# Patient Record
Sex: Female | Born: 1940 | Race: White | Hispanic: No | Smoking: Never smoker
Health system: Southern US, Community
[De-identification: ages and names within clinical notes are randomized; demographics above are authoritative.]

## PROBLEM LIST (undated history)

## (undated) DIAGNOSIS — F329 Major depressive disorder, single episode, unspecified: Secondary | ICD-10-CM

## (undated) DIAGNOSIS — N289 Disorder of kidney and ureter, unspecified: Secondary | ICD-10-CM

## (undated) DIAGNOSIS — E1129 Type 2 diabetes mellitus with other diabetic kidney complication: Secondary | ICD-10-CM

## (undated) DIAGNOSIS — G4730 Sleep apnea, unspecified: Secondary | ICD-10-CM

## (undated) DIAGNOSIS — I10 Essential (primary) hypertension: Secondary | ICD-10-CM

## (undated) DIAGNOSIS — N200 Calculus of kidney: Secondary | ICD-10-CM

## (undated) DIAGNOSIS — F32A Depression, unspecified: Secondary | ICD-10-CM

## (undated) DIAGNOSIS — E785 Hyperlipidemia, unspecified: Secondary | ICD-10-CM

## (undated) DIAGNOSIS — K635 Polyp of colon: Secondary | ICD-10-CM

## (undated) DIAGNOSIS — C4492 Squamous cell carcinoma of skin, unspecified: Secondary | ICD-10-CM

## (undated) HISTORY — DX: Hyperlipidemia, unspecified: E78.5

## (undated) HISTORY — PX: ESOPHAGOGASTRODUODENOSCOPY: SHX1529

## (undated) HISTORY — DX: Depression, unspecified: F32.A

## (undated) HISTORY — DX: Type 2 diabetes mellitus with other diabetic kidney complication: E11.29

## (undated) HISTORY — PX: APPENDECTOMY: SHX54

## (undated) HISTORY — PX: COLONOSCOPY: SHX174

## (undated) HISTORY — DX: Polyp of colon: K63.5

## (undated) HISTORY — DX: Major depressive disorder, single episode, unspecified: F32.9

## (undated) HISTORY — PX: CHOLECYSTECTOMY: SHX55

---

## 1998-11-05 ENCOUNTER — Ambulatory Visit (HOSPITAL_COMMUNITY): Admission: RE | Admit: 1998-11-05 | Discharge: 1998-11-05 | Payer: Self-pay | Admitting: *Deleted

## 1998-11-11 ENCOUNTER — Ambulatory Visit (HOSPITAL_COMMUNITY): Admission: RE | Admit: 1998-11-11 | Discharge: 1998-11-11 | Payer: Self-pay | Admitting: General Surgery

## 1999-03-17 ENCOUNTER — Encounter: Admission: RE | Admit: 1999-03-17 | Discharge: 1999-03-17 | Payer: Self-pay | Admitting: Neurosurgery

## 1999-03-17 ENCOUNTER — Encounter: Payer: Self-pay | Admitting: Neurosurgery

## 1999-07-28 ENCOUNTER — Other Ambulatory Visit: Admission: RE | Admit: 1999-07-28 | Discharge: 1999-07-28 | Payer: Self-pay | Admitting: Family Medicine

## 1999-10-06 ENCOUNTER — Encounter: Admission: RE | Admit: 1999-10-06 | Discharge: 1999-10-06 | Payer: Self-pay | Admitting: Family Medicine

## 1999-10-06 ENCOUNTER — Encounter: Payer: Self-pay | Admitting: Family Medicine

## 1999-11-25 ENCOUNTER — Encounter: Payer: Self-pay | Admitting: Family Medicine

## 1999-11-25 ENCOUNTER — Encounter: Admission: RE | Admit: 1999-11-25 | Discharge: 1999-11-25 | Payer: Self-pay | Admitting: Family Medicine

## 1999-12-01 ENCOUNTER — Encounter: Payer: Self-pay | Admitting: Gynecology

## 1999-12-01 ENCOUNTER — Encounter: Admission: RE | Admit: 1999-12-01 | Discharge: 1999-12-01 | Payer: Self-pay | Admitting: Gynecology

## 2000-12-06 ENCOUNTER — Encounter: Admission: RE | Admit: 2000-12-06 | Discharge: 2000-12-06 | Payer: Self-pay | Admitting: Family Medicine

## 2000-12-06 ENCOUNTER — Encounter: Payer: Self-pay | Admitting: Family Medicine

## 2001-08-20 ENCOUNTER — Other Ambulatory Visit: Admission: RE | Admit: 2001-08-20 | Discharge: 2001-08-20 | Payer: Self-pay | Admitting: Family Medicine

## 2002-08-26 ENCOUNTER — Other Ambulatory Visit: Admission: RE | Admit: 2002-08-26 | Discharge: 2002-08-26 | Payer: Self-pay | Admitting: Family Medicine

## 2003-11-27 ENCOUNTER — Encounter: Admission: RE | Admit: 2003-11-27 | Discharge: 2003-11-27 | Payer: Self-pay | Admitting: Family Medicine

## 2003-12-12 ENCOUNTER — Other Ambulatory Visit: Admission: RE | Admit: 2003-12-12 | Discharge: 2003-12-12 | Payer: Self-pay | Admitting: Family Medicine

## 2004-01-15 ENCOUNTER — Ambulatory Visit (HOSPITAL_COMMUNITY): Admission: RE | Admit: 2004-01-15 | Discharge: 2004-01-15 | Payer: Self-pay | Admitting: *Deleted

## 2004-02-11 ENCOUNTER — Observation Stay (HOSPITAL_COMMUNITY): Admission: RE | Admit: 2004-02-11 | Discharge: 2004-02-12 | Payer: Self-pay | Admitting: General Surgery

## 2004-12-22 ENCOUNTER — Other Ambulatory Visit: Admission: RE | Admit: 2004-12-22 | Discharge: 2004-12-22 | Payer: Self-pay | Admitting: Family Medicine

## 2006-06-16 ENCOUNTER — Ambulatory Visit: Payer: Self-pay | Admitting: Family Medicine

## 2007-02-08 ENCOUNTER — Ambulatory Visit: Payer: Self-pay | Admitting: Gastroenterology

## 2009-11-12 ENCOUNTER — Ambulatory Visit: Payer: Self-pay | Admitting: Unknown Physician Specialty

## 2009-12-23 ENCOUNTER — Emergency Department: Payer: Self-pay | Admitting: Emergency Medicine

## 2010-05-09 HISTORY — PX: RECTAL PROLAPSE REPAIR: SHX759

## 2013-01-18 ENCOUNTER — Emergency Department: Payer: Self-pay | Admitting: Emergency Medicine

## 2013-01-18 LAB — CBC
HCT: 38.6 % (ref 35.0–47.0)
HGB: 13.1 g/dL (ref 12.0–16.0)
MCH: 31.2 pg (ref 26.0–34.0)
MCHC: 33.8 g/dL (ref 32.0–36.0)
MCV: 92 fL (ref 80–100)
Platelet: 170 10*3/uL (ref 150–440)
RBC: 4.18 10*6/uL (ref 3.80–5.20)
RDW: 14.1 % (ref 11.5–14.5)
WBC: 5.7 10*3/uL (ref 3.6–11.0)

## 2013-01-18 LAB — COMPREHENSIVE METABOLIC PANEL
Albumin: 3.3 g/dL — ABNORMAL LOW (ref 3.4–5.0)
Alkaline Phosphatase: 120 U/L (ref 50–136)
Anion Gap: 8 (ref 7–16)
BUN: 14 mg/dL (ref 7–18)
Bilirubin,Total: 0.6 mg/dL (ref 0.2–1.0)
Calcium, Total: 9.3 mg/dL (ref 8.5–10.1)
Chloride: 103 mmol/L (ref 98–107)
Co2: 26 mmol/L (ref 21–32)
Creatinine: 1.48 mg/dL — ABNORMAL HIGH (ref 0.60–1.30)
EGFR (African American): 41 — ABNORMAL LOW
EGFR (Non-African Amer.): 35 — ABNORMAL LOW
Glucose: 342 mg/dL — ABNORMAL HIGH (ref 65–99)
Osmolality: 288 (ref 275–301)
Potassium: 3.9 mmol/L (ref 3.5–5.1)
SGOT(AST): 22 U/L (ref 15–37)
SGPT (ALT): 46 U/L (ref 12–78)
Sodium: 137 mmol/L (ref 136–145)
Total Protein: 6.2 g/dL — ABNORMAL LOW (ref 6.4–8.2)

## 2013-01-18 LAB — URINALYSIS, COMPLETE
Bacteria: NONE SEEN
Bilirubin,UR: NEGATIVE
Glucose,UR: >=500 mg/dL (ref 0–75)
Hyaline Cast: 47
Nitrite: NEGATIVE
Ph: 5 (ref 4.5–8.0)
Protein: 30
RBC,UR: 70 /HPF (ref 0–5)
Specific Gravity: 1.025 (ref 1.003–1.030)
Squamous Epithelial: 15
WBC UR: 11 /HPF (ref 0–5)

## 2013-01-18 LAB — TROPONIN I: Troponin-I: <0.02 ng/mL

## 2013-05-10 ENCOUNTER — Ambulatory Visit: Payer: Self-pay | Admitting: Internal Medicine

## 2015-05-13 DIAGNOSIS — T2101XA Burn of unspecified degree of chest wall, initial encounter: Secondary | ICD-10-CM | POA: Diagnosis not present

## 2015-05-13 DIAGNOSIS — E1165 Type 2 diabetes mellitus with hyperglycemia: Secondary | ICD-10-CM | POA: Diagnosis not present

## 2015-05-13 DIAGNOSIS — M545 Low back pain: Secondary | ICD-10-CM | POA: Diagnosis not present

## 2015-05-13 DIAGNOSIS — M5136 Other intervertebral disc degeneration, lumbar region: Secondary | ICD-10-CM | POA: Diagnosis not present

## 2015-05-13 DIAGNOSIS — M5032 Other cervical disc degeneration, mid-cervical region, unspecified level: Secondary | ICD-10-CM | POA: Diagnosis not present

## 2015-05-13 DIAGNOSIS — E119 Type 2 diabetes mellitus without complications: Secondary | ICD-10-CM | POA: Diagnosis not present

## 2015-05-13 DIAGNOSIS — M7062 Trochanteric bursitis, left hip: Secondary | ICD-10-CM | POA: Diagnosis not present

## 2015-05-13 DIAGNOSIS — E785 Hyperlipidemia, unspecified: Secondary | ICD-10-CM | POA: Diagnosis not present

## 2015-05-13 DIAGNOSIS — M542 Cervicalgia: Secondary | ICD-10-CM | POA: Diagnosis not present

## 2015-05-13 DIAGNOSIS — I10 Essential (primary) hypertension: Secondary | ICD-10-CM | POA: Diagnosis not present

## 2015-05-20 DIAGNOSIS — I10 Essential (primary) hypertension: Secondary | ICD-10-CM | POA: Diagnosis not present

## 2015-05-20 DIAGNOSIS — E785 Hyperlipidemia, unspecified: Secondary | ICD-10-CM | POA: Diagnosis not present

## 2015-05-20 DIAGNOSIS — E119 Type 2 diabetes mellitus without complications: Secondary | ICD-10-CM | POA: Diagnosis not present

## 2015-05-20 DIAGNOSIS — Z23 Encounter for immunization: Secondary | ICD-10-CM | POA: Diagnosis not present

## 2015-05-26 DIAGNOSIS — M899 Disorder of bone, unspecified: Secondary | ICD-10-CM | POA: Diagnosis not present

## 2015-06-16 DIAGNOSIS — S80812A Abrasion, left lower leg, initial encounter: Secondary | ICD-10-CM | POA: Diagnosis not present

## 2015-06-16 DIAGNOSIS — S0033XA Contusion of nose, initial encounter: Secondary | ICD-10-CM | POA: Diagnosis not present

## 2015-06-21 ENCOUNTER — Encounter: Payer: Self-pay | Admitting: Emergency Medicine

## 2015-06-21 ENCOUNTER — Emergency Department: Payer: Medicare Other

## 2015-06-21 ENCOUNTER — Emergency Department
Admission: EM | Admit: 2015-06-21 | Discharge: 2015-06-21 | Disposition: A | Discharge status: Home / Self Care | Payer: Medicare Other | Attending: Emergency Medicine | Admitting: Emergency Medicine

## 2015-06-21 DIAGNOSIS — Z880 Allergy status to penicillin: Secondary | ICD-10-CM | POA: Diagnosis not present

## 2015-06-21 DIAGNOSIS — Y92007 Garden or yard of unspecified non-institutional (private) residence as the place of occurrence of the external cause: Secondary | ICD-10-CM | POA: Diagnosis not present

## 2015-06-21 DIAGNOSIS — Y998 Other external cause status: Secondary | ICD-10-CM | POA: Insufficient documentation

## 2015-06-21 DIAGNOSIS — S93402A Sprain of unspecified ligament of left ankle, initial encounter: Secondary | ICD-10-CM

## 2015-06-21 DIAGNOSIS — Y9301 Activity, walking, marching and hiking: Secondary | ICD-10-CM | POA: Insufficient documentation

## 2015-06-21 DIAGNOSIS — M7989 Other specified soft tissue disorders: Secondary | ICD-10-CM | POA: Diagnosis not present

## 2015-06-21 DIAGNOSIS — S99912A Unspecified injury of left ankle, initial encounter: Secondary | ICD-10-CM | POA: Diagnosis present

## 2015-06-21 DIAGNOSIS — W1842XA Slipping, tripping and stumbling without falling due to stepping into hole or opening, initial encounter: Secondary | ICD-10-CM | POA: Diagnosis not present

## 2015-06-21 DIAGNOSIS — S9002XA Contusion of left ankle, initial encounter: Secondary | ICD-10-CM | POA: Insufficient documentation

## 2015-06-21 NOTE — ED Provider Notes (Signed)
CSN: [id number]     Arrival date & time 06/21/15  2015 History   First MD Initiated Contact with Patient 06/21/15 2149     Chief Complaint  Patient presents with  . Ankle Pain    left ankle injury     (Consider location/radiation/quality/duration/timing/severity/associated sxs/prior Treatment) HPI  75 year old female presents to the emergency department for evaluation of left ankle injury. Approximate 5:30 PM tonight, she was walking her dog, stepped into a hole and rolled her left ankle. Patient's pain is 0 out of 10 with lying down but with weightbearing pain is moderate to severe. She has difficulty with ambulation due to pain. Pain is over the lateral aspect of her ankle. She denies any other injuries to her body. No knee, hip, head, back or neck pain. She is not having medications for pain.    History reviewed. No pertinent past medical history. History reviewed. No pertinent past surgical history. History reviewed. No pertinent family history. Social History  Substance Use Topics  . Smoking status: Never Smoker   . Smokeless tobacco: None  . Alcohol Use: No   OB History    No data available     Review of Systems  Constitutional: Negative.   Cardiovascular: Negative for chest pain and leg swelling.  Gastrointestinal: Negative for abdominal pain.  Musculoskeletal: Positive for joint swelling and gait problem. Negative for back pain and neck pain.  Skin: Negative for color change, rash and wound.  Neurological: Negative for dizziness, syncope and weakness.  Psychiatric/Behavioral: Negative for hallucinations and confusion.  All other systems reviewed and are negative.     Allergies  Amoxicillin and Morphine and related  Home Medications   Prior to Admission medications   Not on File   BP 129/81 mmHg  Pulse 70  Temp(Src) 98.2 F (36.8 C)  Resp 16  Ht 5\' 4"  (1.626 m)  Wt 63.504 kg  BMI 24.02 kg/m2  SpO2 98% Physical Exam  Constitutional: She is oriented to  person, place, and time. She appears well-developed and well-nourished. No distress.  HENT:  Head: Normocephalic and atraumatic.  Eyes: EOM are normal. Pupils are equal, round, and reactive to light.  Neck: Normal range of motion. Neck supple.  Cardiovascular: Normal rate and regular rhythm.   Pulmonary/Chest: No respiratory distress.  Musculoskeletal:       Left ankle: She exhibits decreased range of motion, swelling and ecchymosis. She exhibits no deformity, no laceration and normal pulse. Tenderness. Lateral malleolus and AITFL tenderness found. Achilles tendon exhibits no pain, no defect and normal Thompson's test results.  Neurological: She is alert and oriented to person, place, and time.  Skin: Skin is warm and dry.  Psychiatric: She has a normal mood and affect. Her behavior is normal. Judgment and thought content normal.    ED Course  Procedures (including critical care time) SPLINT APPLICATION Date/Time: [id number] PM Authorized by: Feliberto Gottron Consent: Verbal consent obtained. Risks and benefits: risks, benefits and alternatives were discussed Consent given by: patient Splint applied by: ED tech Location details: Left ankle  Splint type: Ankle stirrup  Supplies used: Prefabricated ankle stirrup splint, Ace wrap, prewrap.  Post-procedure: The splinted body part was neurovascularly unchanged following the procedure. Patient tolerance: Patient tolerated the procedure well with no immediate complications.    Labs Review Labs Reviewed - No data to display  Imaging Review Dg Ankle Complete Left  06/21/2015  CLINICAL DATA:  Patient stepped into a hole and twisted the ankle. Anterior and lateral ankle  pain. EXAM: LEFT ANKLE COMPLETE - 3+ VIEW COMPARISON:  None. FINDINGS: Soft tissue swelling about the lateral aspect of the left ankle. No evidence of acute fracture or dislocation. No focal bone lesion or bone destruction. No radiopaque soft tissue foreign bodies.  IMPRESSION: Soft tissue swelling lateral to the left ankle. No acute bony abnormalities. Electronically Signed   By: Lucienne Capers M.D.   On: 06/21/2015 22:53   I have personally reviewed and evaluated these images and lab results as part of my medical decision-making.   EKG Interpretation None      MDM   Final diagnoses:  Ankle sprain, left, initial encounter    75 year old female with left lateral ankle sprain. She is placed into a splint, given a walker. She'll rest ice and elevate. Follow-up with orthopedics if no improvement in 5-7 days.    Duanne Guess, PA-C 06/21/15 2305  Orbie Pyo, MD 06/21/15 [phone]

## 2015-06-21 NOTE — ED Notes (Signed)
Was chasing her dog and twisted her ankle when she stepped in a hole. Ice on injury

## 2015-06-21 NOTE — ED Notes (Signed)
Patient states that she was walking in the yard and stepped into a hole and turned her left foot over.

## 2015-06-21 NOTE — Discharge Instructions (Signed)
Ankle Sprain °An ankle sprain is an injury to the strong, fibrous tissues (ligaments) that hold the bones of your ankle joint together.  °CAUSES °An ankle sprain is usually caused by a fall or by twisting your ankle. Ankle sprains most commonly occur when you step on the outer edge of your foot, and your ankle turns inward. People who participate in sports are more prone to these types of injuries.  °SYMPTOMS  °· Pain in your ankle. The pain may be present at rest or only when you are trying to stand or walk. °· Swelling. °· Bruising. Bruising may develop immediately or within 1 to 2 days after your injury. °· Difficulty standing or walking, particularly when turning corners or changing directions. °DIAGNOSIS  °Your caregiver will ask you details about your injury and perform a physical exam of your ankle to determine if you have an ankle sprain. During the physical exam, your caregiver will press on and apply pressure to specific areas of your foot and ankle. Your caregiver will try to move your ankle in certain ways. An X-ray exam may be done to be sure a bone was not broken or a ligament did not separate from one of the bones in your ankle (avulsion fracture).  °TREATMENT  °Certain types of braces can help stabilize your ankle. Your caregiver can make a recommendation for this. Your caregiver may recommend the use of medicine for pain. If your sprain is severe, your caregiver may refer you to a surgeon who helps to restore function to parts of your skeletal system (orthopedist) or a physical therapist. °HOME CARE INSTRUCTIONS  °· Apply ice to your injury for 1-2 days or as directed by your caregiver. Applying ice helps to reduce inflammation and pain. °· Put ice in a plastic bag. °· Place a towel between your skin and the bag. °· Leave the ice on for 15-20 minutes at a time, every 2 hours while you are awake. °· Only take over-the-counter or prescription medicines for pain, discomfort, or fever as directed by  your caregiver. °· Elevate your injured ankle above the level of your heart as much as possible for 2-3 days. °· If your caregiver recommends crutches, use them as instructed. Gradually put weight on the affected ankle. Continue to use crutches or a cane until you can walk without feeling pain in your ankle. °· If you have a plaster splint, wear the splint as directed by your caregiver. Do not rest it on anything harder than a pillow for the first 24 hours. Do not put weight on it. Do not get it wet. You may take it off to take a shower or bath. °· You may have been given an elastic bandage to wear around your ankle to provide support. If the elastic bandage is too tight (you have numbness or tingling in your foot or your foot becomes cold and blue), adjust the bandage to make it comfortable. °· If you have an air splint, you may blow more air into it or let air out to make it more comfortable. You may take your splint off at night and before taking a shower or bath. Wiggle your toes in the splint several times per day to decrease swelling. °SEEK MEDICAL CARE IF:  °· You have rapidly increasing bruising or swelling. °· Your toes feel extremely cold or you lose feeling in your foot. °· Your pain is not relieved with medicine. °SEEK IMMEDIATE MEDICAL CARE IF: °· Your toes are numb or blue. °·   You have severe pain that is increasing. °MAKE SURE YOU:  °· Understand these instructions. °· Will watch your condition. °· Will get help right away if you are not doing well or get worse. °  °This information is not intended to replace advice given to you by your health care provider. Make sure you discuss any questions you have with your health care provider. °  °Document Released: 04/25/2005 Document Revised: 05/16/2014 Document Reviewed: 05/07/2011 °Elsevier Interactive Patient Education ©2016 [employer] ° °Cryotherapy °Cryotherapy means treatment with cold. Ice or gel packs can be used to reduce both pain and swelling.  Ice is the most helpful within the first 24 to 48 hours after an injury or flare-up from overusing a muscle or joint. Sprains, strains, spasms, burning pain, shooting pain, and aches can all be eased with ice. Ice can also be used when recovering from surgery. Ice is effective, has very few side effects, and is safe for most people to use. °PRECAUTIONS  °Ice is not a safe treatment option for people with: °· Raynaud phenomenon. This is a condition affecting small blood vessels in the extremities. Exposure to cold may cause your problems to return. °· Cold hypersensitivity. There are many forms of cold hypersensitivity, including: °¨ Cold urticaria. Red, itchy hives appear on the skin when the tissues begin to warm after being iced. °¨ Cold erythema. This is a red, itchy rash caused by exposure to cold. °¨ Cold hemoglobinuria. Red blood cells break down when the tissues begin to warm after being iced. The hemoglobin that carry oxygen are passed into the urine because they cannot combine with blood proteins fast enough. °· Numbness or altered sensitivity in the area being iced. °If you have any of the following conditions, do not use ice until you have discussed cryotherapy with your caregiver: °· Heart conditions, such as arrhythmia, angina, or chronic heart disease. °· High blood pressure. °· Healing wounds or open skin in the area being iced. °· Current infections. °· Rheumatoid arthritis. °· Poor circulation. °· Diabetes. °Ice slows the blood flow in the region it is applied. This is beneficial when trying to stop inflamed tissues from spreading irritating chemicals to surrounding tissues. However, if you expose your skin to cold temperatures for too long or without the proper protection, you can damage your skin or nerves. Watch for signs of skin damage due to cold. °HOME CARE INSTRUCTIONS °Follow these tips to use ice and cold packs safely. °· Place a dry or damp towel between the ice and skin. A damp towel will  cool the skin more quickly, so you may need to shorten the time that the ice is used. °· For a more rapid response, add gentle compression to the ice. °· Ice for no more than 10 to 20 minutes at a time. The bonier the area you are icing, the less time it will take to get the benefits of ice. °· Check your skin after 5 minutes to make sure there are no signs of a poor response to cold or skin damage. °· Rest 20 minutes or more between uses. °· Once your skin is numb, you can end your treatment. You can test numbness by very lightly touching your skin. The touch should be so light that you do not see the skin dimple from the pressure of your fingertip. When using ice, most people will feel these normal sensations in this order: cold, burning, aching, and numbness. °· Do not use ice on someone who   cannot communicate their responses to pain, such as small children or people with dementia. °HOW TO MAKE AN ICE PACK °Ice packs are the most common way to use ice therapy. Other methods include ice massage, ice baths, and cryosprays. Muscle creams that cause a cold, tingly feeling do not offer the same benefits that ice offers and should not be used as a substitute unless recommended by your caregiver. °To make an ice pack, do one of the following: °· Place crushed ice or a bag of frozen vegetables in a sealable plastic bag. Squeeze out the excess air. Place this bag inside another plastic bag. Slide the bag into a pillowcase or place a damp towel between your skin and the bag. °· Mix 3 parts water with 1 part rubbing alcohol. Freeze the mixture in a sealable plastic bag. When you remove the mixture from the freezer, it will be slushy. Squeeze out the excess air. Place this bag inside another plastic bag. Slide the bag into a pillowcase or place a damp towel between your skin and the bag. °SEEK MEDICAL CARE IF: °· You develop white spots on your skin. This may give the skin a blotchy (mottled) appearance. °· Your skin turns  blue or pale. °· Your skin becomes waxy or hard. °· Your swelling gets worse. °MAKE SURE YOU:  °· Understand these instructions. °· Will watch your condition. °· Will get help right away if you are not doing well or get worse. °  °This information is not intended to replace advice given to you by your health care provider. Make sure you discuss any questions you have with your health care provider. °  °Document Released: 12/20/2010 Document Revised: 05/16/2014 Document Reviewed: 12/20/2010 °Elsevier Interactive Patient Education ©2016 Elsevier Inc[employer] ° °

## 2015-06-29 DIAGNOSIS — J019 Acute sinusitis, unspecified: Secondary | ICD-10-CM | POA: Diagnosis not present

## 2015-07-13 DIAGNOSIS — J019 Acute sinusitis, unspecified: Secondary | ICD-10-CM | POA: Diagnosis not present

## 2015-07-13 DIAGNOSIS — B9689 Other specified bacterial agents as the cause of diseases classified elsewhere: Secondary | ICD-10-CM | POA: Diagnosis not present

## 2015-07-13 DIAGNOSIS — R05 Cough: Secondary | ICD-10-CM | POA: Diagnosis not present

## 2015-07-13 DIAGNOSIS — S93402A Sprain of unspecified ligament of left ankle, initial encounter: Secondary | ICD-10-CM | POA: Diagnosis not present

## 2015-08-12 DIAGNOSIS — M7062 Trochanteric bursitis, left hip: Secondary | ICD-10-CM | POA: Diagnosis not present

## 2015-08-12 DIAGNOSIS — M899 Disorder of bone, unspecified: Secondary | ICD-10-CM | POA: Diagnosis not present

## 2015-08-12 DIAGNOSIS — M542 Cervicalgia: Secondary | ICD-10-CM | POA: Diagnosis not present

## 2015-08-12 DIAGNOSIS — E1165 Type 2 diabetes mellitus with hyperglycemia: Secondary | ICD-10-CM | POA: Diagnosis not present

## 2015-08-12 DIAGNOSIS — T2101XA Burn of unspecified degree of chest wall, initial encounter: Secondary | ICD-10-CM | POA: Diagnosis not present

## 2015-08-12 DIAGNOSIS — M5032 Other cervical disc degeneration, mid-cervical region, unspecified level: Secondary | ICD-10-CM | POA: Diagnosis not present

## 2015-08-12 DIAGNOSIS — M545 Low back pain: Secondary | ICD-10-CM | POA: Diagnosis not present

## 2015-08-12 DIAGNOSIS — M5136 Other intervertebral disc degeneration, lumbar region: Secondary | ICD-10-CM | POA: Diagnosis not present

## 2015-08-12 DIAGNOSIS — S93402S Sprain of unspecified ligament of left ankle, sequela: Secondary | ICD-10-CM | POA: Diagnosis not present

## 2015-08-12 DIAGNOSIS — M25572 Pain in left ankle and joints of left foot: Secondary | ICD-10-CM | POA: Diagnosis not present

## 2015-08-31 DIAGNOSIS — S93492S Sprain of other ligament of left ankle, sequela: Secondary | ICD-10-CM | POA: Diagnosis not present

## 2015-08-31 DIAGNOSIS — M25572 Pain in left ankle and joints of left foot: Secondary | ICD-10-CM | POA: Diagnosis not present

## 2015-08-31 DIAGNOSIS — S8265XA Nondisplaced fracture of lateral malleolus of left fibula, initial encounter for closed fracture: Secondary | ICD-10-CM | POA: Diagnosis not present

## 2015-09-30 DIAGNOSIS — E119 Type 2 diabetes mellitus without complications: Secondary | ICD-10-CM | POA: Diagnosis not present

## 2015-09-30 DIAGNOSIS — I10 Essential (primary) hypertension: Secondary | ICD-10-CM | POA: Diagnosis not present

## 2015-09-30 DIAGNOSIS — E785 Hyperlipidemia, unspecified: Secondary | ICD-10-CM | POA: Diagnosis not present

## 2015-10-06 DIAGNOSIS — I10 Essential (primary) hypertension: Secondary | ICD-10-CM | POA: Diagnosis not present

## 2015-10-06 DIAGNOSIS — E119 Type 2 diabetes mellitus without complications: Secondary | ICD-10-CM | POA: Diagnosis not present

## 2015-10-06 DIAGNOSIS — Z048 Encounter for examination and observation for other specified reasons: Secondary | ICD-10-CM | POA: Diagnosis not present

## 2015-10-06 DIAGNOSIS — E785 Hyperlipidemia, unspecified: Secondary | ICD-10-CM | POA: Diagnosis not present

## 2015-10-12 DIAGNOSIS — T2101XA Burn of unspecified degree of chest wall, initial encounter: Secondary | ICD-10-CM | POA: Diagnosis not present

## 2015-10-12 DIAGNOSIS — E1165 Type 2 diabetes mellitus with hyperglycemia: Secondary | ICD-10-CM | POA: Diagnosis not present

## 2015-10-12 DIAGNOSIS — M7062 Trochanteric bursitis, left hip: Secondary | ICD-10-CM | POA: Diagnosis not present

## 2015-10-12 DIAGNOSIS — M25572 Pain in left ankle and joints of left foot: Secondary | ICD-10-CM | POA: Diagnosis not present

## 2015-10-12 DIAGNOSIS — M545 Low back pain: Secondary | ICD-10-CM | POA: Diagnosis not present

## 2015-10-12 DIAGNOSIS — M542 Cervicalgia: Secondary | ICD-10-CM | POA: Diagnosis not present

## 2015-10-12 DIAGNOSIS — M5032 Other cervical disc degeneration, mid-cervical region, unspecified level: Secondary | ICD-10-CM | POA: Diagnosis not present

## 2015-10-12 DIAGNOSIS — M5136 Other intervertebral disc degeneration, lumbar region: Secondary | ICD-10-CM | POA: Diagnosis not present

## 2015-11-02 DIAGNOSIS — G4733 Obstructive sleep apnea (adult) (pediatric): Secondary | ICD-10-CM | POA: Diagnosis not present

## 2015-11-04 DIAGNOSIS — J019 Acute sinusitis, unspecified: Secondary | ICD-10-CM | POA: Diagnosis not present

## 2015-11-24 DIAGNOSIS — E119 Type 2 diabetes mellitus without complications: Secondary | ICD-10-CM | POA: Diagnosis not present

## 2015-11-26 DIAGNOSIS — D485 Neoplasm of uncertain behavior of skin: Secondary | ICD-10-CM | POA: Diagnosis not present

## 2015-11-26 DIAGNOSIS — C44622 Squamous cell carcinoma of skin of right upper limb, including shoulder: Secondary | ICD-10-CM | POA: Diagnosis not present

## 2015-11-26 DIAGNOSIS — L570 Actinic keratosis: Secondary | ICD-10-CM | POA: Diagnosis not present

## 2015-11-26 DIAGNOSIS — L821 Other seborrheic keratosis: Secondary | ICD-10-CM | POA: Diagnosis not present

## 2015-11-26 DIAGNOSIS — X32XXXA Exposure to sunlight, initial encounter: Secondary | ICD-10-CM | POA: Diagnosis not present

## 2015-12-08 DIAGNOSIS — M47812 Spondylosis without myelopathy or radiculopathy, cervical region: Secondary | ICD-10-CM | POA: Diagnosis not present

## 2015-12-08 DIAGNOSIS — M7062 Trochanteric bursitis, left hip: Secondary | ICD-10-CM | POA: Diagnosis not present

## 2015-12-08 DIAGNOSIS — E1165 Type 2 diabetes mellitus with hyperglycemia: Secondary | ICD-10-CM | POA: Diagnosis not present

## 2015-12-08 DIAGNOSIS — M545 Low back pain: Secondary | ICD-10-CM | POA: Diagnosis not present

## 2015-12-08 DIAGNOSIS — M6281 Muscle weakness (generalized): Secondary | ICD-10-CM | POA: Diagnosis not present

## 2015-12-08 DIAGNOSIS — M542 Cervicalgia: Secondary | ICD-10-CM | POA: Diagnosis not present

## 2015-12-08 DIAGNOSIS — C4492 Squamous cell carcinoma of skin, unspecified: Secondary | ICD-10-CM

## 2015-12-08 DIAGNOSIS — M25572 Pain in left ankle and joints of left foot: Secondary | ICD-10-CM | POA: Diagnosis not present

## 2015-12-08 DIAGNOSIS — M25519 Pain in unspecified shoulder: Secondary | ICD-10-CM | POA: Diagnosis not present

## 2015-12-08 DIAGNOSIS — M5136 Other intervertebral disc degeneration, lumbar region: Secondary | ICD-10-CM | POA: Diagnosis not present

## 2015-12-08 DIAGNOSIS — M5032 Other cervical disc degeneration, mid-cervical region, unspecified level: Secondary | ICD-10-CM | POA: Diagnosis not present

## 2015-12-08 DIAGNOSIS — T2101XA Burn of unspecified degree of chest wall, initial encounter: Secondary | ICD-10-CM | POA: Diagnosis not present

## 2015-12-08 HISTORY — DX: Squamous cell carcinoma of skin, unspecified: C44.92

## 2015-12-24 DIAGNOSIS — L905 Scar conditions and fibrosis of skin: Secondary | ICD-10-CM | POA: Diagnosis not present

## 2015-12-24 DIAGNOSIS — C44622 Squamous cell carcinoma of skin of right upper limb, including shoulder: Secondary | ICD-10-CM | POA: Diagnosis not present

## 2016-01-14 DIAGNOSIS — H8149 Vertigo of central origin, unspecified ear: Secondary | ICD-10-CM | POA: Diagnosis not present

## 2016-01-20 DIAGNOSIS — E119 Type 2 diabetes mellitus without complications: Secondary | ICD-10-CM | POA: Diagnosis not present

## 2016-01-20 DIAGNOSIS — E785 Hyperlipidemia, unspecified: Secondary | ICD-10-CM | POA: Diagnosis not present

## 2016-01-20 DIAGNOSIS — I10 Essential (primary) hypertension: Secondary | ICD-10-CM | POA: Diagnosis not present

## 2016-01-21 DIAGNOSIS — B0222 Postherpetic trigeminal neuralgia: Secondary | ICD-10-CM | POA: Diagnosis not present

## 2016-01-27 DIAGNOSIS — E785 Hyperlipidemia, unspecified: Secondary | ICD-10-CM | POA: Diagnosis not present

## 2016-01-27 DIAGNOSIS — Z23 Encounter for immunization: Secondary | ICD-10-CM | POA: Diagnosis not present

## 2016-01-27 DIAGNOSIS — E119 Type 2 diabetes mellitus without complications: Secondary | ICD-10-CM | POA: Diagnosis not present

## 2016-01-27 DIAGNOSIS — I10 Essential (primary) hypertension: Secondary | ICD-10-CM | POA: Diagnosis not present

## 2016-03-01 ENCOUNTER — Other Ambulatory Visit: Payer: Self-pay | Admitting: Student

## 2016-03-01 DIAGNOSIS — R1114 Bilious vomiting: Secondary | ICD-10-CM

## 2016-03-01 DIAGNOSIS — R1084 Generalized abdominal pain: Secondary | ICD-10-CM | POA: Diagnosis not present

## 2016-03-04 DIAGNOSIS — E119 Type 2 diabetes mellitus without complications: Secondary | ICD-10-CM | POA: Diagnosis not present

## 2016-03-07 ENCOUNTER — Ambulatory Visit
Admission: RE | Admit: 2016-03-07 | Discharge: 2016-03-07 | Disposition: A | Discharge status: Home / Self Care | Payer: Medicare Other | Source: Ambulatory Visit | Attending: Student | Admitting: Student

## 2016-03-07 DIAGNOSIS — R1114 Bilious vomiting: Secondary | ICD-10-CM | POA: Insufficient documentation

## 2016-03-07 DIAGNOSIS — I700 Atherosclerosis of aorta: Secondary | ICD-10-CM | POA: Insufficient documentation

## 2016-03-07 DIAGNOSIS — R1084 Generalized abdominal pain: Secondary | ICD-10-CM | POA: Diagnosis not present

## 2016-03-07 HISTORY — DX: Essential (primary) hypertension: I10

## 2016-03-07 HISTORY — DX: Squamous cell carcinoma of skin, unspecified: C44.92

## 2016-03-07 LAB — POCT I-STAT CREATININE: Creatinine, Ser: 1.4 mg/dL — ABNORMAL HIGH (ref 0.44–1.00)

## 2016-03-07 MED ORDER — IOPAMIDOL (ISOVUE-300) INJECTION 61%
85.0000 mL | Freq: Once | INTRAVENOUS | Status: AC | PRN
  Administered 2016-03-07: 80 mL via INTRAVENOUS

## 2016-03-09 ENCOUNTER — Other Ambulatory Visit: Payer: Self-pay | Admitting: Student

## 2016-03-09 DIAGNOSIS — E119 Type 2 diabetes mellitus without complications: Secondary | ICD-10-CM

## 2016-03-09 DIAGNOSIS — R112 Nausea with vomiting, unspecified: Secondary | ICD-10-CM

## 2016-03-17 ENCOUNTER — Ambulatory Visit
Admission: RE | Admit: 2016-03-17 | Discharge: 2016-03-17 | Disposition: A | Discharge status: Home / Self Care | Payer: Medicare Other | Source: Ambulatory Visit | Attending: Student | Admitting: Student

## 2016-03-17 DIAGNOSIS — E119 Type 2 diabetes mellitus without complications: Secondary | ICD-10-CM | POA: Diagnosis not present

## 2016-03-17 DIAGNOSIS — R112 Nausea with vomiting, unspecified: Secondary | ICD-10-CM | POA: Diagnosis not present

## 2016-03-17 MED ORDER — TECHNETIUM TC 99M SULFUR COLLOID
2.4150 | Freq: Once | INTRAVENOUS | Status: AC | PRN
  Administered 2016-03-17: 2.415 via INTRAVENOUS

## 2016-03-21 DIAGNOSIS — M7062 Trochanteric bursitis, left hip: Secondary | ICD-10-CM | POA: Diagnosis not present

## 2016-03-21 DIAGNOSIS — M47812 Spondylosis without myelopathy or radiculopathy, cervical region: Secondary | ICD-10-CM | POA: Diagnosis not present

## 2016-03-21 DIAGNOSIS — M6281 Muscle weakness (generalized): Secondary | ICD-10-CM | POA: Diagnosis not present

## 2016-03-21 DIAGNOSIS — T2101XA Burn of unspecified degree of chest wall, initial encounter: Secondary | ICD-10-CM | POA: Diagnosis not present

## 2016-03-21 DIAGNOSIS — M25572 Pain in left ankle and joints of left foot: Secondary | ICD-10-CM | POA: Diagnosis not present

## 2016-03-21 DIAGNOSIS — M5032 Other cervical disc degeneration, mid-cervical region, unspecified level: Secondary | ICD-10-CM | POA: Diagnosis not present

## 2016-03-21 DIAGNOSIS — M25519 Pain in unspecified shoulder: Secondary | ICD-10-CM | POA: Diagnosis not present

## 2016-03-21 DIAGNOSIS — M542 Cervicalgia: Secondary | ICD-10-CM | POA: Diagnosis not present

## 2016-03-23 ENCOUNTER — Encounter
Admission: RE | Disposition: A | Discharge status: Home / Self Care | Payer: Self-pay | Source: Ambulatory Visit | Attending: Unknown Physician Specialty

## 2016-03-23 ENCOUNTER — Ambulatory Visit: Payer: Medicare Other | Admitting: Certified Registered Nurse Anesthetist

## 2016-03-23 ENCOUNTER — Ambulatory Visit
Admission: RE | Admit: 2016-03-23 | Discharge: 2016-03-23 | Disposition: A | Discharge status: Home / Self Care | Payer: Medicare Other | Source: Ambulatory Visit | Attending: Unknown Physician Specialty | Admitting: Unknown Physician Specialty

## 2016-03-23 DIAGNOSIS — G4730 Sleep apnea, unspecified: Secondary | ICD-10-CM | POA: Insufficient documentation

## 2016-03-23 DIAGNOSIS — I10 Essential (primary) hypertension: Secondary | ICD-10-CM | POA: Insufficient documentation

## 2016-03-23 DIAGNOSIS — Z79899 Other long term (current) drug therapy: Secondary | ICD-10-CM | POA: Diagnosis not present

## 2016-03-23 DIAGNOSIS — R12 Heartburn: Secondary | ICD-10-CM | POA: Diagnosis not present

## 2016-03-23 DIAGNOSIS — J449 Chronic obstructive pulmonary disease, unspecified: Secondary | ICD-10-CM | POA: Insufficient documentation

## 2016-03-23 DIAGNOSIS — E119 Type 2 diabetes mellitus without complications: Secondary | ICD-10-CM | POA: Insufficient documentation

## 2016-03-23 DIAGNOSIS — Z85828 Personal history of other malignant neoplasm of skin: Secondary | ICD-10-CM | POA: Insufficient documentation

## 2016-03-23 DIAGNOSIS — K449 Diaphragmatic hernia without obstruction or gangrene: Secondary | ICD-10-CM | POA: Insufficient documentation

## 2016-03-23 DIAGNOSIS — Z794 Long term (current) use of insulin: Secondary | ICD-10-CM | POA: Insufficient documentation

## 2016-03-23 DIAGNOSIS — K2960 Other gastritis without bleeding: Secondary | ICD-10-CM | POA: Diagnosis not present

## 2016-03-23 DIAGNOSIS — R1110 Vomiting, unspecified: Secondary | ICD-10-CM | POA: Diagnosis not present

## 2016-03-23 DIAGNOSIS — R1084 Generalized abdominal pain: Secondary | ICD-10-CM | POA: Diagnosis not present

## 2016-03-23 HISTORY — PX: ESOPHAGOGASTRODUODENOSCOPY (EGD) WITH PROPOFOL: SHX5813

## 2016-03-23 SURGERY — ESOPHAGOGASTRODUODENOSCOPY (EGD) WITH PROPOFOL
Anesthesia: General

## 2016-03-23 MED ORDER — PROPOFOL 500 MG/50ML IV EMUL
INTRAVENOUS | Status: DC | PRN
Start: 2016-03-23 — End: 2016-03-23
  Administered 2016-03-23: 125 ug/kg/min via INTRAVENOUS

## 2016-03-23 MED ORDER — SODIUM CHLORIDE 0.9 % IV SOLN: INTRAVENOUS | Status: DC

## 2016-03-23 MED ORDER — SODIUM CHLORIDE 0.9 % IV SOLN
INTRAVENOUS | Status: DC
  Administered 2016-03-23 (×2): via INTRAVENOUS

## 2016-03-23 MED ORDER — PROPOFOL 10 MG/ML IV BOLUS
INTRAVENOUS | Status: DC | PRN
  Administered 2016-03-23: 70 mg via INTRAVENOUS
  Administered 2016-03-23: 30 mg via INTRAVENOUS

## 2016-03-23 NOTE — Transfer of Care (Signed)
Immediate Anesthesia Transfer of Care Note  Patient: Kelly [name]  Procedure(s) Performed: Procedure(s): ESOPHAGOGASTRODUODENOSCOPY (EGD) WITH PROPOFOL (N/A)  Patient Location: PACU  Anesthesia Type:General  Level of Consciousness: sedated  Airway & Oxygen Therapy: Patient Spontanous Breathing and Patient connected to nasal cannula oxygen  Post-op Assessment: Report given to RN and Post -op Vital signs reviewed and stable  Post vital signs: Reviewed and stable  Last Vitals:  Vitals:   03/23/16 1426 03/23/16 1617  BP: 122/67 (!) 91/52  Pulse: 78 76  Resp: 16 16  Temp: 37 C (!) 36.1 C    Last Pain:  Vitals:   03/23/16 1617  TempSrc: Tympanic      Patients Stated Pain Goal: 0 (48/01/65 5374)  Complications: No apparent anesthesia complications

## 2016-03-23 NOTE — H&P (Signed)
   Primary Care Physician:  Albina Billet, MD Primary Gastroenterologist:  Dr. Vira Agar  Pre-Procedure History & Physical: HPI:  Kelly [name] is a 75 y.o. female is here for an endoscopy.   Past Medical History:  Diagnosis Date  . Diabetes mellitus without complication ([place])   . Hypertension   . 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     Past Surgical History:  Procedure Laterality Date  . APPENDECTOMY    . CHOLECYSTECTOMY    . COLONOSCOPY    . ESOPHAGOGASTRODUODENOSCOPY    . RECTAL PROLAPSE REPAIR     x2    Prior to Admission medications   Medication Sig Start Date End Date Taking? Authorizing Provider  AMLODIPINE BESYLATE PO Take by mouth.   Yes Historical Provider, MD  aspirin EC 81 MG tablet Take 81 mg by mouth daily.   Yes Historical Provider, MD  Atorvastatin Calcium (LIPITOR PO) Take by mouth.   Yes Historical Provider, MD  ENALAPRIL MALEATE PO Take by mouth.   Yes Historical Provider, MD  gabapentin (NEURONTIN) 300 MG capsule Take 300 mg by mouth at bedtime.   Yes Historical Provider, MD  omeprazole (PRILOSEC) 20 MG capsule Take 20 mg by mouth daily.   Yes Historical Provider, MD  ondansetron (ZOFRAN-ODT) 4 MG disintegrating tablet Take 4 mg by mouth every 8 (eight) hours as needed for nausea or vomiting.   Yes Historical Provider, MD  PARoxetine HCl (PAXIL PO) Take by mouth.   Yes Historical Provider, MD    Allergies as of 03/22/2016 - Review Complete 03/22/2016  Allergen Reaction Noted  . Amoxicillin  06/21/2015  . Morphine and related  06/21/2015    History reviewed. No pertinent family history.  Social History   Social History  . Marital status: Married    Spouse name: N/A  . Number of children: N/A  . Years of education: N/A   Occupational History  . Not on file.   Social History Main Topics  . Smoking status: Never Smoker  . Smokeless tobacco: Never Used  . Alcohol use No  . Drug use: No  . Sexual activity: Not  on file   Other Topics Concern  . Not on file   Social History Narrative  . No narrative on file    Review of Systems: See HPI, otherwise negative ROS  Physical Exam: BP 122/67   Pulse 78   Temp 98.6 F (37 C) (Oral)   Resp 16   Ht 5\' 4"  (1.626 m)   Wt 63.5 kg (140 lb)   SpO2 96%   BMI 24.03 kg/m  General:   Alert,  pleasant and cooperative in NAD Head:  Normocephalic and atraumatic. Neck:  Supple; no masses or thyromegaly. Lungs:  Clear throughout to auscultation.    Heart:  Regular rate and rhythm. Abdomen:  Soft, nontender and nondistended. Normal bowel sounds, without guarding, and without rebound.   Neurologic:  Alert and  oriented x4;  grossly normal neurologically.  Impression/Plan: Kelly [name] is here for an endoscopy to be performed for vomiting, nausea, abdominal pain.  Risks, benefits, limitations, and alternatives regarding  endoscopy have been reviewed with the patient.  Questions have been answered.  All parties agreeable.   Gaylyn Cheers, MD  03/23/2016, 3:51 PM

## 2016-03-23 NOTE — OR Nursing (Signed)
PT DISCHARGED HOME TO SELF CARE. I/S TO RESUME REGULAR ADA DIET,DO NOT EAT 2HRS BEFORE SLEEPING AND RESUME LOW DOSE ASPIRIN IN AM. UNDERSTANDING VOICED. STANDARD ENDO DISCHARGE INSTRUCTIONS GIVEN

## 2016-03-23 NOTE — Anesthesia Procedure Notes (Signed)
Date/Time: 03/23/2016 4:00 PM Performed by: Nelda Marseille Pre-anesthesia Checklist: Patient identified, Emergency Drugs available, Suction available, Patient being monitored and Timeout performed Oxygen Delivery Method: Nasal cannula

## 2016-03-23 NOTE — Op Note (Signed)
[facility] Patient Name: Kelly [name] Procedure Date: 03/23/2016 3:49 PM MRN: [id number] Account #: [id number] Date of Birth: [birth year] Admit Type: Outpatient Age: 75 Room: [facility] ENDO ROOM 4 Gender: Female Note Status: Finalized Procedure:            Upper GI endoscopy Indications:          Heartburn, Persistent vomiting of unknown cause Providers:            Manya Silvas, MD Referring MD:         Leona Carry. Hall Busing, MD (Referring MD) Medicines:            Propofol per Anesthesia Complications:        No immediate complications. Procedure:            Pre-Anesthesia Assessment:                       - After reviewing the risks and benefits, the patient                        was deemed in satisfactory condition to undergo the                        procedure.                       After obtaining informed consent, the endoscope was                        passed under direct vision. Throughout the procedure,                        the patient's blood pressure, pulse, and oxygen                        saturations were monitored continuously. The Endoscope                        was introduced through the mouth, and advanced to the                        second part of duodenum. The upper GI endoscopy was                        accomplished without difficulty. The patient tolerated                        the procedure well. Findings:      The examined esophagus was normal. GEJ 36-37cm.      A small-medium hiatal hernia was present.      Diffuse mildly erythematous mucosa without bleeding was found in the       gastric antrum. Biopsies were taken with a cold forceps for histology.       Biopsies were taken with a cold forceps for Helicobacter pylori testing.      The examined duodenum was normal. Impression:           - Normal esophagus.                       - Small hiatal hernia.                       -  Erythematous mucosa in the antrum.  Biopsied.                       - Normal examined duodenum. Recommendation:       - The findings and recommendations were discussed with                        the patient's family. Recommend await biopsies and                        perform antireflux measures. Manya Silvas, MD 03/23/2016 4:15:40 PM This report has been signed electronically. Number of Addenda: 0 Note Initiated On: 03/23/2016 3:49 PM      [facility]

## 2016-03-23 NOTE — Anesthesia Preprocedure Evaluation (Signed)
Anesthesia Evaluation  Patient identified by MRN, date of birth, ID band Patient awake    Reviewed: Allergy & Precautions, NPO status , Patient's Chart, lab work & pertinent test results  History of Anesthesia Complications Negative for: history of anesthetic complications  Airway Mallampati: I  TM Distance: >3 FB Neck ROM: Full    Dental no notable dental hx.    Pulmonary sleep apnea and Continuous Positive Airway Pressure Ventilation , neg COPD,    breath sounds clear to auscultation- rhonchi (-) wheezing      Cardiovascular hypertension, Pt. on medications (-) CAD and (-) Past MI  Rhythm:Regular Rate:Normal - Systolic murmurs and - Diastolic murmurs    Neuro/Psych negative neurological ROS  negative psych ROS   GI/Hepatic negative GI ROS, Neg liver ROS,   Endo/Other  diabetes, Type 2, Oral Hypoglycemic Agents, Insulin Dependent  Renal/GU negative Renal ROS     Musculoskeletal negative musculoskeletal ROS (+)   Abdominal (+) - obese,   Peds  Hematology negative hematology ROS (+)   Anesthesia Other Findings Past Medical History: No date: Diabetes mellitus without complication ([facility]) No date: Hypertension 12/2015: Squamous cell skin cancer     Comment: resected from Right wrist.    Reproductive/Obstetrics                             Anesthesia Physical Anesthesia Plan  ASA: II  Anesthesia Plan: General   Post-op Pain Management:    Induction: Intravenous  Airway Management Planned: Natural Airway  Additional Equipment:   Intra-op Plan:   Post-operative Plan:   Informed Consent: I have reviewed the patients History and Physical, chart, labs and discussed the procedure including the risks, benefits and alternatives for the proposed anesthesia with the patient or authorized representative who has indicated his/her understanding and acceptance.   Dental advisory given  Plan  Discussed with: CRNA and Anesthesiologist  Anesthesia Plan Comments:         Anesthesia Quick Evaluation

## 2016-03-24 ENCOUNTER — Encounter: Payer: Self-pay | Admitting: Unknown Physician Specialty

## 2016-03-28 LAB — SURGICAL PATHOLOGY

## 2016-03-28 NOTE — Anesthesia Postprocedure Evaluation (Signed)
Anesthesia Post Note  Patient: Kelly [name]  Procedure(s) Performed: Procedure(s) (LRB): ESOPHAGOGASTRODUODENOSCOPY (EGD) WITH PROPOFOL (N/A)  Patient location during evaluation: Endoscopy Anesthesia Type: General Level of consciousness: awake and alert Pain management: pain level controlled Vital Signs Assessment: post-procedure vital signs reviewed and stable Respiratory status: spontaneous breathing, nonlabored ventilation and respiratory function stable Cardiovascular status: blood pressure returned to baseline and stable Postop Assessment: no signs of nausea or vomiting Anesthetic complications: no    Last Vitals:  Vitals:   03/23/16 1647 03/23/16 1657  BP: 117/83 128/70  Pulse: 74 77  Resp: (!) 22 16  Temp:      Last Pain:  Vitals:   03/24/16 0735  TempSrc:   PainSc: 0-No pain                 Martha Clan

## 2016-04-01 DIAGNOSIS — M542 Cervicalgia: Secondary | ICD-10-CM | POA: Diagnosis not present

## 2016-04-13 DIAGNOSIS — Z08 Encounter for follow-up examination after completed treatment for malignant neoplasm: Secondary | ICD-10-CM | POA: Diagnosis not present

## 2016-04-13 DIAGNOSIS — L570 Actinic keratosis: Secondary | ICD-10-CM | POA: Diagnosis not present

## 2016-04-13 DIAGNOSIS — L821 Other seborrheic keratosis: Secondary | ICD-10-CM | POA: Diagnosis not present

## 2016-04-13 DIAGNOSIS — Z85828 Personal history of other malignant neoplasm of skin: Secondary | ICD-10-CM | POA: Diagnosis not present

## 2016-04-13 DIAGNOSIS — X32XXXA Exposure to sunlight, initial encounter: Secondary | ICD-10-CM | POA: Diagnosis not present

## 2016-04-18 DIAGNOSIS — M545 Low back pain: Secondary | ICD-10-CM | POA: Diagnosis not present

## 2016-04-18 DIAGNOSIS — R52 Pain, unspecified: Secondary | ICD-10-CM | POA: Diagnosis not present

## 2016-04-18 DIAGNOSIS — M47812 Spondylosis without myelopathy or radiculopathy, cervical region: Secondary | ICD-10-CM | POA: Diagnosis not present

## 2016-04-18 DIAGNOSIS — M7062 Trochanteric bursitis, left hip: Secondary | ICD-10-CM | POA: Diagnosis not present

## 2016-04-18 DIAGNOSIS — M5032 Other cervical disc degeneration, mid-cervical region, unspecified level: Secondary | ICD-10-CM | POA: Diagnosis not present

## 2016-04-18 DIAGNOSIS — M25572 Pain in left ankle and joints of left foot: Secondary | ICD-10-CM | POA: Diagnosis not present

## 2016-04-18 DIAGNOSIS — E1165 Type 2 diabetes mellitus with hyperglycemia: Secondary | ICD-10-CM | POA: Diagnosis not present

## 2016-04-18 DIAGNOSIS — M6281 Muscle weakness (generalized): Secondary | ICD-10-CM | POA: Diagnosis not present

## 2016-04-18 DIAGNOSIS — T2101XA Burn of unspecified degree of chest wall, initial encounter: Secondary | ICD-10-CM | POA: Diagnosis not present

## 2016-04-18 DIAGNOSIS — M542 Cervicalgia: Secondary | ICD-10-CM | POA: Diagnosis not present

## 2016-04-18 DIAGNOSIS — M5136 Other intervertebral disc degeneration, lumbar region: Secondary | ICD-10-CM | POA: Diagnosis not present

## 2016-04-18 DIAGNOSIS — M25519 Pain in unspecified shoulder: Secondary | ICD-10-CM | POA: Diagnosis not present

## 2016-04-21 DIAGNOSIS — G4733 Obstructive sleep apnea (adult) (pediatric): Secondary | ICD-10-CM | POA: Diagnosis not present

## 2016-05-12 DIAGNOSIS — N184 Chronic kidney disease, stage 4 (severe): Secondary | ICD-10-CM | POA: Diagnosis not present

## 2016-05-12 DIAGNOSIS — N10 Acute tubulo-interstitial nephritis: Secondary | ICD-10-CM | POA: Diagnosis not present

## 2016-05-12 DIAGNOSIS — I10 Essential (primary) hypertension: Secondary | ICD-10-CM | POA: Diagnosis not present

## 2016-05-16 DIAGNOSIS — K2970 Gastritis, unspecified, without bleeding: Secondary | ICD-10-CM | POA: Diagnosis not present

## 2016-05-16 DIAGNOSIS — R112 Nausea with vomiting, unspecified: Secondary | ICD-10-CM | POA: Diagnosis not present

## 2016-07-06 DIAGNOSIS — E119 Type 2 diabetes mellitus without complications: Secondary | ICD-10-CM | POA: Diagnosis not present

## 2016-07-06 DIAGNOSIS — E785 Hyperlipidemia, unspecified: Secondary | ICD-10-CM | POA: Diagnosis not present

## 2016-07-06 DIAGNOSIS — I10 Essential (primary) hypertension: Secondary | ICD-10-CM | POA: Diagnosis not present

## 2016-07-06 DIAGNOSIS — Z79899 Other long term (current) drug therapy: Secondary | ICD-10-CM | POA: Diagnosis not present

## 2016-07-08 ENCOUNTER — Emergency Department: Payer: Medicare Other

## 2016-07-08 ENCOUNTER — Encounter: Payer: Self-pay | Admitting: Emergency Medicine

## 2016-07-08 ENCOUNTER — Inpatient Hospital Stay
Admission: EM | Admit: 2016-07-08 | Discharge: 2016-07-20 | DRG: 684 | Disposition: A | Discharge status: Home / Self Care | Payer: Medicare Other | Attending: Internal Medicine | Admitting: Internal Medicine

## 2016-07-08 DIAGNOSIS — Z85828 Personal history of other malignant neoplasm of skin: Secondary | ICD-10-CM

## 2016-07-08 DIAGNOSIS — S3983XA Other specified injuries of pelvis, initial encounter: Secondary | ICD-10-CM | POA: Diagnosis not present

## 2016-07-08 DIAGNOSIS — E875 Hyperkalemia: Secondary | ICD-10-CM | POA: Diagnosis present

## 2016-07-08 DIAGNOSIS — N170 Acute kidney failure with tubular necrosis: Secondary | ICD-10-CM | POA: Diagnosis not present

## 2016-07-08 DIAGNOSIS — Z87442 Personal history of urinary calculi: Secondary | ICD-10-CM | POA: Diagnosis not present

## 2016-07-08 DIAGNOSIS — N179 Acute kidney failure, unspecified: Secondary | ICD-10-CM

## 2016-07-08 DIAGNOSIS — N184 Chronic kidney disease, stage 4 (severe): Secondary | ICD-10-CM | POA: Diagnosis not present

## 2016-07-08 DIAGNOSIS — E119 Type 2 diabetes mellitus without complications: Secondary | ICD-10-CM | POA: Diagnosis not present

## 2016-07-08 DIAGNOSIS — Z794 Long term (current) use of insulin: Secondary | ICD-10-CM | POA: Diagnosis not present

## 2016-07-08 DIAGNOSIS — Z885 Allergy status to narcotic agent status: Secondary | ICD-10-CM

## 2016-07-08 DIAGNOSIS — Z79899 Other long term (current) drug therapy: Secondary | ICD-10-CM | POA: Diagnosis not present

## 2016-07-08 DIAGNOSIS — R109 Unspecified abdominal pain: Secondary | ICD-10-CM

## 2016-07-08 DIAGNOSIS — Z9049 Acquired absence of other specified parts of digestive tract: Secondary | ICD-10-CM | POA: Diagnosis not present

## 2016-07-08 DIAGNOSIS — E8770 Fluid overload, unspecified: Secondary | ICD-10-CM | POA: Diagnosis present

## 2016-07-08 DIAGNOSIS — R0902 Hypoxemia: Secondary | ICD-10-CM

## 2016-07-08 DIAGNOSIS — I129 Hypertensive chronic kidney disease with stage 1 through stage 4 chronic kidney disease, or unspecified chronic kidney disease: Secondary | ICD-10-CM | POA: Diagnosis not present

## 2016-07-08 DIAGNOSIS — E1122 Type 2 diabetes mellitus with diabetic chronic kidney disease: Secondary | ICD-10-CM | POA: Diagnosis present

## 2016-07-08 DIAGNOSIS — K219 Gastro-esophageal reflux disease without esophagitis: Secondary | ICD-10-CM | POA: Diagnosis present

## 2016-07-08 DIAGNOSIS — E1121 Type 2 diabetes mellitus with diabetic nephropathy: Secondary | ICD-10-CM | POA: Diagnosis present

## 2016-07-08 DIAGNOSIS — Z7982 Long term (current) use of aspirin: Secondary | ICD-10-CM | POA: Diagnosis not present

## 2016-07-08 DIAGNOSIS — N189 Chronic kidney disease, unspecified: Secondary | ICD-10-CM

## 2016-07-08 DIAGNOSIS — Z880 Allergy status to penicillin: Secondary | ICD-10-CM | POA: Diagnosis not present

## 2016-07-08 DIAGNOSIS — N183 Chronic kidney disease, stage 3 (moderate): Secondary | ICD-10-CM | POA: Diagnosis not present

## 2016-07-08 DIAGNOSIS — J90 Pleural effusion, not elsewhere classified: Secondary | ICD-10-CM | POA: Diagnosis not present

## 2016-07-08 DIAGNOSIS — I10 Essential (primary) hypertension: Secondary | ICD-10-CM | POA: Diagnosis present

## 2016-07-08 DIAGNOSIS — N10 Acute tubulo-interstitial nephritis: Secondary | ICD-10-CM | POA: Diagnosis present

## 2016-07-08 DIAGNOSIS — Z9889 Other specified postprocedural states: Secondary | ICD-10-CM | POA: Diagnosis not present

## 2016-07-08 HISTORY — DX: Calculus of kidney: N20.0

## 2016-07-08 LAB — CBC WITH DIFFERENTIAL/PLATELET
Basophils Absolute: 0.1 10*3/uL (ref 0–0.1)
Basophils Relative: 1 %
Eosinophils Absolute: 0.1 10*3/uL (ref 0–0.7)
Eosinophils Relative: 1 %
HCT: 36.1 % (ref 35.0–47.0)
Hemoglobin: 11.9 g/dL — ABNORMAL LOW (ref 12.0–16.0)
Lymphocytes Relative: 22 %
Lymphs Abs: 1.3 10*3/uL (ref 1.0–3.6)
MCH: 30 pg (ref 26.0–34.0)
MCHC: 32.8 g/dL (ref 32.0–36.0)
MCV: 91.3 fL (ref 80.0–100.0)
Monocytes Absolute: 0.3 10*3/uL (ref 0.2–0.9)
Monocytes Relative: 5 %
Neutro Abs: 4.1 10*3/uL (ref 1.4–6.5)
Neutrophils Relative %: 71 %
Platelets: 156 10*3/uL (ref 150–440)
RBC: 3.96 MIL/uL (ref 3.80–5.20)
RDW: 13.8 % (ref 11.5–14.5)
WBC: 5.9 10*3/uL (ref 3.6–11.0)

## 2016-07-08 LAB — URINALYSIS, COMPLETE (UACMP) WITH MICROSCOPIC
Bilirubin Urine: NEGATIVE
Glucose, UA: NEGATIVE mg/dL
Hgb urine dipstick: NEGATIVE
Ketones, ur: NEGATIVE mg/dL
Leukocytes, UA: NEGATIVE
Nitrite: NEGATIVE
Protein, ur: NEGATIVE mg/dL
Specific Gravity, Urine: 1.01 (ref 1.005–1.030)
pH: 5 (ref 5.0–8.0)

## 2016-07-08 LAB — BASIC METABOLIC PANEL
Anion gap: 10 (ref 5–15)
BUN: 84 mg/dL — ABNORMAL HIGH (ref 6–20)
CO2: 22 mmol/L (ref 22–32)
Calcium: 8.9 mg/dL (ref 8.9–10.3)
Chloride: 111 mmol/L (ref 101–111)
Creatinine, Ser: 7.89 mg/dL — ABNORMAL HIGH (ref 0.44–1.00)
GFR calc Af Amer: 5 mL/min — ABNORMAL LOW (ref >60)
GFR calc non Af Amer: 4 mL/min — ABNORMAL LOW (ref >60)
Glucose, Bld: 178 mg/dL — ABNORMAL HIGH (ref 65–99)
Potassium: 5 mmol/L (ref 3.5–5.1)
Sodium: 143 mmol/L (ref 135–145)

## 2016-07-08 LAB — GLUCOSE, CAPILLARY
Glucose-Capillary: 157 mg/dL — ABNORMAL HIGH (ref 65–99)
Glucose-Capillary: 140 mg/dL — ABNORMAL HIGH (ref 65–99)
Glucose-Capillary: 201 mg/dL — ABNORMAL HIGH (ref 65–99)

## 2016-07-08 MED ORDER — INSULIN ASPART 100 UNIT/ML ~~LOC~~ SOLN
0.0000 [IU] | Freq: Every day | SUBCUTANEOUS | Status: DC
  Administered 2016-07-08 – 2016-07-16 (×3): 2 [IU] via SUBCUTANEOUS
  Administered 2016-07-19: 4 [IU] via SUBCUTANEOUS
  Filled 2016-07-08: qty 2
  Filled 2016-07-08: qty 4
  Filled 2016-07-08 (×2): qty 2

## 2016-07-08 MED ORDER — ASPIRIN EC 81 MG PO TBEC
81.0000 mg | DELAYED_RELEASE_TABLET | Freq: Every day | ORAL | Status: DC
  Administered 2016-07-08 – 2016-07-13 (×6): 81 mg via ORAL
  Filled 2016-07-08 (×6): qty 1

## 2016-07-08 MED ORDER — ACETAMINOPHEN 650 MG RE SUPP: 650.0000 mg | Freq: Four times a day (QID) | RECTAL | Status: DC | PRN

## 2016-07-08 MED ORDER — CALCIUM CARBONATE-VITAMIN D 500-200 MG-UNIT PO TABS
1.0000 | ORAL_TABLET | Freq: Two times a day (BID) | ORAL | Status: DC
  Administered 2016-07-08 – 2016-07-13 (×11): 1 via ORAL
  Filled 2016-07-08 (×12): qty 1

## 2016-07-08 MED ORDER — ATORVASTATIN CALCIUM 10 MG PO TABS
10.0000 mg | ORAL_TABLET | Freq: Every day | ORAL | Status: DC
  Administered 2016-07-08 – 2016-07-20 (×13): 10 mg via ORAL
  Filled 2016-07-08 (×13): qty 1

## 2016-07-08 MED ORDER — ACETAMINOPHEN 325 MG PO TABS
650.0000 mg | ORAL_TABLET | Freq: Four times a day (QID) | ORAL | Status: DC | PRN
  Administered 2016-07-08 – 2016-07-17 (×6): 650 mg via ORAL
  Filled 2016-07-08 (×4): qty 2
  Filled 2016-07-08: qty 1
  Filled 2016-07-08 (×3): qty 2

## 2016-07-08 MED ORDER — MECLIZINE HCL 25 MG PO TABS: 12.5000 mg | ORAL_TABLET | Freq: Three times a day (TID) | ORAL | Status: DC | PRN

## 2016-07-08 MED ORDER — GABAPENTIN 300 MG PO CAPS
300.0000 mg | ORAL_CAPSULE | Freq: Every day | ORAL | Status: DC
  Administered 2016-07-08 – 2016-07-19 (×12): 300 mg via ORAL
  Filled 2016-07-08 (×12): qty 1

## 2016-07-08 MED ORDER — GLIPIZIDE ER 10 MG PO TB24
10.0000 mg | ORAL_TABLET | Freq: Two times a day (BID) | ORAL | Status: DC
  Administered 2016-07-09 (×2): 10 mg via ORAL
  Filled 2016-07-08 (×3): qty 1

## 2016-07-08 MED ORDER — ONDANSETRON 4 MG PO TBDP: 4.0000 mg | ORAL_TABLET | Freq: Three times a day (TID) | ORAL | Status: DC | PRN

## 2016-07-08 MED ORDER — PANTOPRAZOLE SODIUM 40 MG PO TBEC
40.0000 mg | DELAYED_RELEASE_TABLET | Freq: Every day | ORAL | Status: DC
  Administered 2016-07-08 – 2016-07-18 (×11): 40 mg via ORAL
  Filled 2016-07-08 (×11): qty 1

## 2016-07-08 MED ORDER — DOCUSATE SODIUM 100 MG PO CAPS: 100.0000 mg | ORAL_CAPSULE | Freq: Every day | ORAL | Status: DC | PRN

## 2016-07-08 MED ORDER — INSULIN GLARGINE 100 UNIT/ML ~~LOC~~ SOLN
16.0000 [IU] | Freq: Every day | SUBCUTANEOUS | Status: DC
  Administered 2016-07-08 – 2016-07-09 (×2): 16 [IU] via SUBCUTANEOUS
  Filled 2016-07-08 (×3): qty 0.16

## 2016-07-08 MED ORDER — HEPARIN SODIUM (PORCINE) 5000 UNIT/ML IJ SOLN
5000.0000 [IU] | Freq: Three times a day (TID) | INTRAMUSCULAR | Status: DC
  Administered 2016-07-08 – 2016-07-14 (×18): 5000 [IU] via SUBCUTANEOUS
  Filled 2016-07-08 (×18): qty 1

## 2016-07-08 MED ORDER — GLIPIZIDE ER 10 MG PO TB24
10.0000 mg | ORAL_TABLET | Freq: Two times a day (BID) | ORAL | Status: DC
  Filled 2016-07-08: qty 1

## 2016-07-08 MED ORDER — ALPRAZOLAM 0.5 MG PO TABS: 0.5000 mg | ORAL_TABLET | Freq: Two times a day (BID) | ORAL | Status: DC | PRN

## 2016-07-08 MED ORDER — AMLODIPINE BESYLATE 5 MG PO TABS
5.0000 mg | ORAL_TABLET | Freq: Every day | ORAL | Status: DC
  Administered 2016-07-08 – 2016-07-20 (×13): 5 mg via ORAL
  Filled 2016-07-08 (×13): qty 1

## 2016-07-08 MED ORDER — SUCRALFATE 1 G PO TABS
1.0000 g | ORAL_TABLET | Freq: Three times a day (TID) | ORAL | Status: DC
  Administered 2016-07-08 – 2016-07-09 (×3): 1 g via ORAL
  Filled 2016-07-08 (×3): qty 1

## 2016-07-08 MED ORDER — INSULIN ASPART 100 UNIT/ML ~~LOC~~ SOLN
0.0000 [IU] | Freq: Three times a day (TID) | SUBCUTANEOUS | Status: DC
  Administered 2016-07-08: 1 [IU] via SUBCUTANEOUS
  Administered 2016-07-10: 5 [IU] via SUBCUTANEOUS
  Administered 2016-07-12: 1 [IU] via SUBCUTANEOUS
  Administered 2016-07-12: 2 [IU] via SUBCUTANEOUS
  Administered 2016-07-13: 1 [IU] via SUBCUTANEOUS
  Administered 2016-07-15: 2 [IU] via SUBCUTANEOUS
  Administered 2016-07-16: 3 [IU] via SUBCUTANEOUS
  Administered 2016-07-16: 1 [IU] via SUBCUTANEOUS
  Administered 2016-07-17 (×2): 2 [IU] via SUBCUTANEOUS
  Administered 2016-07-17 – 2016-07-18 (×2): 1 [IU] via SUBCUTANEOUS
  Administered 2016-07-18 (×2): 2 [IU] via SUBCUTANEOUS
  Administered 2016-07-19: 1 [IU] via SUBCUTANEOUS
  Administered 2016-07-19: 3 [IU] via SUBCUTANEOUS
  Administered 2016-07-19: 1 [IU] via SUBCUTANEOUS
  Administered 2016-07-20: 3 [IU] via SUBCUTANEOUS
  Filled 2016-07-08: qty 1
  Filled 2016-07-08: qty 2
  Filled 2016-07-08 (×2): qty 1
  Filled 2016-07-08 (×5): qty 2
  Filled 2016-07-08 (×2): qty 1
  Filled 2016-07-08: qty 5
  Filled 2016-07-08: qty 2
  Filled 2016-07-08: qty 3
  Filled 2016-07-08: qty 5
  Filled 2016-07-08: qty 1
  Filled 2016-07-08: qty 2
  Filled 2016-07-08 (×2): qty 3

## 2016-07-08 MED ORDER — SODIUM CHLORIDE 0.9 % IV SOLN
INTRAVENOUS | Status: AC
  Administered 2016-07-08 – 2016-07-09 (×3): via INTRAVENOUS

## 2016-07-08 MED ORDER — PAROXETINE HCL ER 12.5 MG PO TB24
25.0000 mg | ORAL_TABLET | Freq: Every day | ORAL | Status: DC
  Administered 2016-07-08 – 2016-07-20 (×13): 25 mg via ORAL
  Filled 2016-07-08 (×13): qty 2

## 2016-07-08 MED ORDER — SODIUM CHLORIDE 0.9 % IV BOLUS (SEPSIS)
1000.0000 mL | Freq: Once | INTRAVENOUS | Status: AC
  Administered 2016-07-08: 1000 mL via INTRAVENOUS

## 2016-07-08 NOTE — ED Provider Notes (Signed)
[facility] Emergency Department Provider Note  ____________________________________________   First MD Initiated Contact with Patient 07/08/16 1301     (approximate)  I have reviewed the triage vital signs and the nursing notes.   HISTORY  Chief Complaint Abnormal Lab and Flank Pain   HPI Kelly [name] is a 76 y.o. female with a history of kidney stones as well as diabetes and hypertension who is presenting to the emergency department with 4 days of right flank pain as well as acute renal failure. She was sent in by her primary care doctor after having elevated creatinine and BUN as an outpatient. She says that the flank pain to the right side as a dull ache at this time and is a 2 out of 10. She says it comes and goes over the past 4 days. She is also complaining of nausea and vomiting but says this has been a chronic issue. Denies any diarrhea. No burning with urination.   Past Medical History:  Diagnosis Date  . Diabetes mellitus without complication ([place])   . Hypertension   . Kidney stones   . 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     There are no active problems to display for this patient.   Past Surgical History:  Procedure Laterality Date  . APPENDECTOMY    . CHOLECYSTECTOMY    . COLONOSCOPY    . ESOPHAGOGASTRODUODENOSCOPY    . ESOPHAGOGASTRODUODENOSCOPY (EGD) WITH PROPOFOL N/A 03/23/2016   Procedure: ESOPHAGOGASTRODUODENOSCOPY (EGD) WITH PROPOFOL;  Surgeon: Manya Silvas, MD;  Location: [facility] ENDOSCOPY;  Service: Endoscopy;  Laterality: N/A;  . RECTAL PROLAPSE REPAIR     x2    Prior to Admission medications   Medication Sig Start Date End Date Taking? Authorizing Provider  amLODipine (NORVASC) 5 MG tablet Take 5 mg by mouth daily.    Yes Historical Provider, MD  aspirin EC 81 MG tablet Take 81 mg by mouth daily.   Yes Historical Provider, MD  atorvastatin (LIPITOR) 10 MG tablet Take 10 mg by mouth  daily.    Yes Historical Provider, MD  calcium citrate-vitamin D (CITRACAL+D) 315-200 MG-UNIT tablet Take 1 tablet by mouth 2 (two) times daily.   Yes Historical Provider, MD  enalapril (VASOTEC) 20 MG tablet Take 20 mg by mouth daily.    Yes Historical Provider, MD  gabapentin (NEURONTIN) 300 MG capsule Take 300 mg by mouth at bedtime.   Yes Historical Provider, MD  glipiZIDE (GLUCOTROL XL) 10 MG 24 hr tablet Take 10 mg by mouth 2 (two) times daily.  03/25/16  Yes Historical Provider, MD  hydrochlorothiazide (HYDRODIURIL) 25 MG tablet Take 25 mg by mouth daily. 03/25/16  Yes Historical Provider, MD  Insulin Glargine (BASAGLAR KWIKPEN) 100 UNIT/ML SOPN Inject 16 Units into the skin at bedtime.    Yes Historical Provider, MD  liraglutide (VICTOZA) 18 MG/3ML SOPN Inject 18 mg into the skin daily. 03/25/16  Yes Historical Provider, MD  meclizine (ANTIVERT) 12.5 MG tablet Take 12.5 mg by mouth 3 (three) times daily as needed for dizziness.   Yes Historical Provider, MD  omeprazole (PRILOSEC) 20 MG capsule Take 20 mg by mouth daily.   Yes Historical Provider, MD  PARoxetine (PAXIL-CR) 25 MG 24 hr tablet Take 25 mg by mouth daily.   Yes Historical Provider, MD  sucralfate (CARAFATE) 1 g tablet Take 1 tablet by mouth 3 (three) times daily. 05/16/16  Yes Historical Provider, MD  ALPRAZolam Duanne Moron) 0.5 MG  tablet Take 0.5 mg by mouth 2 (two) times daily as needed. 07/06/16   Historical Provider, MD  ondansetron (ZOFRAN-ODT) 4 MG disintegrating tablet Take 4 mg by mouth every 8 (eight) hours as needed for nausea or vomiting.    Historical Provider, MD    Allergies Amoxicillin and Morphine and related  No family history on file.  Social History Social History  Substance Use Topics  . Smoking status: Never Smoker  . Smokeless tobacco: Never Used  . Alcohol use No    Review of Systems Constitutional: No fever/chills Eyes: No visual changes. ENT: No sore throat. Cardiovascular: Denies chest  pain. Respiratory: Denies shortness of breath. Gastrointestinal: No abdominal pain.    No diarrhea.  No constipation. Genitourinary: Negative for dysuria. Musculoskeletal: Right-sided flank pain Skin: Negative for rash. Neurological: Negative for headaches, focal weakness or numbness.  10-point ROS otherwise negative.  ____________________________________________   PHYSICAL EXAM:  VITAL SIGNS: ED Triage Vitals  Enc Vitals Group     BP --      Pulse Rate 07/08/16 1246 85     Resp 07/08/16 1246 20     Temp 07/08/16 1246 99 F (37.2 C)     Temp Source 07/08/16 1246 Oral     SpO2 07/08/16 1246 94 %     Weight 07/08/16 1247 140 lb (63.5 kg)     Height 07/08/16 1247 5\' 4"  (1.626 m)     Head Circumference --      Peak Flow --      Pain Score 07/08/16 1252 2     Pain Loc --      Pain Edu? --      Excl. in [place]? --     Constitutional: Alert and oriented. Well appearing and in no acute distress. Eyes: Conjunctivae are normal. PERRL. EOMI. Head: Atraumatic. Nose: No congestion/rhinnorhea. Mouth/Throat: Mucous membranes are moist.  Oropharynx non-erythematous. Neck: No stridor.   Cardiovascular: Normal rate, regular rhythm. Grossly normal heart sounds.  Good peripheral circulation. Respiratory: Normal respiratory effort.  No retractions. Lungs CTAB. Gastrointestinal: Soft and nontender. No distention. Mild right-sided CVA tenderness to palpation. Musculoskeletal: No lower extremity tenderness nor edema.  No joint effusions. Neurologic:  Normal speech and language. No gross focal neurologic deficits are appreciated. No gait instability. Skin:  Skin is warm, dry and intact. No rash noted. Psychiatric: Mood and affect are normal. Speech and behavior are normal.  ____________________________________________   LABS (all labs ordered are listed, but only abnormal results are displayed)  Labs Reviewed  GLUCOSE, CAPILLARY - Abnormal; Notable for the following:       Result Value    Glucose-Capillary 157 (*)    All other components within normal limits  URINALYSIS, COMPLETE (UACMP) WITH MICROSCOPIC - Abnormal; Notable for the following:    Color, Urine STRAW (*)    APPearance CLEAR (*)    Bacteria, UA RARE (*)    Squamous Epithelial / LPF 0-5 (*)    All other components within normal limits  BASIC METABOLIC PANEL - Abnormal; Notable for the following:    Glucose, Bld 178 (*)    BUN 84 (*)    Creatinine, Ser 7.89 (*)    GFR calc non Af Amer 4 (*)    GFR calc Af Amer 5 (*)    All other components within normal limits  CBC WITH DIFFERENTIAL/PLATELET - Abnormal; Notable for the following:    Hemoglobin 11.9 (*)    All other components within normal limits   ____________________________________________  EKG   ____________________________________________  RADIOLOGY  CT RENAL STONE STUDY (Final result)  Result time 07/08/16 14:58:20  Final result by Lahoma Crocker, MD (07/08/16 14:58:20)           Narrative:   CLINICAL DATA: Right flank pain for 4 days, status post cholecystectomy. Status post appendectomy  EXAM: CT ABDOMEN AND PELVIS WITHOUT CONTRAST  TECHNIQUE: Multidetector CT imaging of the abdomen and pelvis was performed following the standard protocol without IV contrast.  COMPARISON: 03/07/2016.  FINDINGS: Lower chest: Lung bases shows no acute findings  Hepatobiliary: No focal liver abnormality is seen. Status post cholecystectomy. No biliary dilatation.  Pancreas: Unremarkable. No pancreatic ductal dilatation or surrounding inflammatory changes.  Spleen: Normal in size without focal abnormality.  Adrenals/Urinary Tract: No adrenal gland mass. There is a punctate nonobstructive calcification in upper pole of the left kidney measures 2.4 mm. No hydronephrosis or hydroureter. No calcified ureteral calculi. No calcified calculi are noted within urinary bladder. No thickening of urinary bladder wall.  Stomach/Bowel: No small bowel  obstruction. No thickened or dilated small bowel loops. There is a low lying cecum. No pericecal inflammation. The patient is status post appendectomy.  Some colonic stool and gas noted within transverse colon. Some colonic stool noted within descending colon and sigmoid colon. Scattered diverticula are noted sigmoid colon and descending colon. No evidence of acute colitis or diverticulitis. No distal colonic obstruction.  Vascular/Lymphatic: Mild atherosclerotic calcifications of abdominal aorta. No aortic aneurysm. No adenopathy.  Reproductive: The uterus is atrophic. No adnexal mass.  Other: There is no evidence of ascites or free abdominal air. There is mild stranding of subcutaneous fat in anterior abdominal wall just right and left adjacent to umbilical region. This is stable from prior exam.  Musculoskeletal: No destructive bony lesions are noted. There are degenerative changes lumbar spine with significant disc space flattening with endplate sclerotic changes and mild spinal canal stenosis at L2-L3 level. Stable about 9 mm anterolisthesis L5 on S1 vertebral body. Significant disc space flattening with vacuum disc phenomenon at L5-S1 level. 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  IMPRESSION: 1. Status post cholecystectomy. 2. There is left nonobstructive nephrolithiasis. No hydronephrosis or hydroureter. No calcified ureteral calculi. 3. No calcified calculi are noted within urinary bladder. 4. There is a low lying cecum. No pericecal inflammation. Status post appendectomy. No small bowel or colonic obstruction. 5. Moderate stool are noted descending colon and proximal sigmoid colon. Colonic diverticula are noted descending colon and sigmoid colon. No evidence of acute colitis or diverticulitis. 6. Stable degenerative changes lumbar spine most significant at L2-L3 and L5-S1 level. Stable anterolisthesis about 1 cm L5 on S1 vertebral body.   Electronically  Signed By: Lahoma Crocker M.D. On: 07/08/2016 14:58            ____________________________________________   PROCEDURES  Procedure(s) performed:   Procedures  Critical Care performed:   ____________________________________________   INITIAL IMPRESSION / ASSESSMENT AND PLAN / ED COURSE  Pertinent labs & imaging results that were available during my care of the patient were reviewed by me and considered in my medical decision making (see chart for details).  ----------------------------------------- 3:56 PM on 07/08/2016 -----------------------------------------  Patient without acute findings on her CAT scan. Will require admission for further workup for her renal failure. I explain this plan as well as the results to the patient as well as family. They're understanding the plan and willing to comply. Signed out to Dr. Margaretmary Eddy.  ____________________________________________   FINAL CLINICAL IMPRESSION(S) / ED DIAGNOSES  Final diagnoses:  Right flank pain  Acute renal failure, unspecified acute renal failure type ([place])      NEW MEDICATIONS STARTED DURING THIS VISIT:  New Prescriptions   No medications on file     Note:  This document was prepared using Dragon voice recognition software and may include unintentional dictation errors.    Orbie Pyo, MD 07/08/16 1556

## 2016-07-08 NOTE — H&P (Signed)
[facility] [place] NAME: Kelly [name]    MR#:  [id number]  DATE OF BIRTH:  03/11/1941  DATE OF ADMISSION:  07/08/2016  PRIMARY CARE PHYSICIAN: Albina Billet, MD   REQUESTING/REFERRING PHYSICIAN: Orbie Pyo, MD  CHIEF COMPLAINT:  Flank pain and abnormal labs  HISTORY OF PRESENT ILLNESS:  Kelly [name]  is a 76 y.o. female with a known history of Diabetes mellitus, hypertension, chronic kidney stones is presenting to the ED with a chief complaint of 4 day history of right flank pain. Patient was a seen by her primary care physician at [facility] internal medicine and outpatient labs have revealed elevated BUN at 81 and creatinine 7.26. The flank pain is intermittent associated with nausea but denies any vomiting. Denies any abdominal pain or burning micturition or decreased urination. No fevers. Patient is sent over to the emergency department and here creatinine is at 7.9 and BUN 84. CT scan is negative for hydronephrosis or any other acute abnormalities. Hospitalist team is called to admit the patient. Nephrologist is informed by the hospitalist  PAST MEDICAL HISTORY:   Past Medical History:  Diagnosis Date  . Diabetes mellitus without complication ([place])   . Hypertension   . Kidney stones   . 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     PAST SURGICAL HISTOIRY:   Past Surgical History:  Procedure Laterality Date  . APPENDECTOMY    . CHOLECYSTECTOMY    . COLONOSCOPY    . ESOPHAGOGASTRODUODENOSCOPY    . ESOPHAGOGASTRODUODENOSCOPY (EGD) WITH PROPOFOL N/A 03/23/2016   Procedure: ESOPHAGOGASTRODUODENOSCOPY (EGD) WITH PROPOFOL;  Surgeon: Manya Silvas, MD;  Location: [facility] ENDOSCOPY;  Service: Endoscopy;  Laterality: N/A;  . RECTAL PROLAPSE REPAIR     x2    SOCIAL HISTORY:   Social History  Substance Use Topics  . Smoking status: Never Smoker  . Smokeless tobacco: Never Used  .  Alcohol use No    FAMILY HISTORY:  All sisters has cardiac history  DRUG ALLERGIES:   Allergies  Allergen Reactions  . Amoxicillin   . Morphine And Related     REVIEW OF SYSTEMS:  CONSTITUTIONAL: No fever, fatigue or weakness.  EYES: No blurred or double vision.  EARS, NOSE, AND THROAT: No tinnitus or ear pain.  RESPIRATORY: No cough, shortness of breath, wheezing or hemoptysis.  CARDIOVASCULAR: No chest pain, orthopnea, edema.  GASTROINTESTINAL:Patient has nausea, denies vomiting, diarrhea or abdominal pain. Patient has right flank pain GENITOURINARY: No dysuria, hematuria.  ENDOCRINE: No polyuria, nocturia,  HEMATOLOGY: No anemia, easy bruising or bleeding SKIN: No rash or lesion. MUSCULOSKELETAL: No joint pain or arthritis.   NEUROLOGIC: No tingling, numbness, weakness.  PSYCHIATRY: Anxious but denies any depression   MEDICATIONS AT HOME:   Prior to Admission medications   Medication Sig Start Date End Date Taking? Authorizing Provider  amLODipine (NORVASC) 5 MG tablet Take 5 mg by mouth daily.    Yes Historical Provider, MD  aspirin EC 81 MG tablet Take 81 mg by mouth daily.   Yes Historical Provider, MD  atorvastatin (LIPITOR) 10 MG tablet Take 10 mg by mouth daily.    Yes Historical Provider, MD  calcium citrate-vitamin D (CITRACAL+D) 315-200 MG-UNIT tablet Take 1 tablet by mouth 2 (two) times daily.   Yes Historical Provider, MD  enalapril (VASOTEC) 20 MG tablet Take 20 mg by mouth daily.    Yes Historical Provider, MD  gabapentin (NEURONTIN) 300 MG capsule  Take 300 mg by mouth at bedtime.   Yes Historical Provider, MD  glipiZIDE (GLUCOTROL XL) 10 MG 24 hr tablet Take 10 mg by mouth 2 (two) times daily.  03/25/16  Yes Historical Provider, MD  hydrochlorothiazide (HYDRODIURIL) 25 MG tablet Take 25 mg by mouth daily. 03/25/16  Yes Historical Provider, MD  Insulin Glargine (BASAGLAR KWIKPEN) 100 UNIT/ML SOPN Inject 16 Units into the skin at bedtime.    Yes Historical  Provider, MD  liraglutide (VICTOZA) 18 MG/3ML SOPN Inject 18 mg into the skin daily. 03/25/16  Yes Historical Provider, MD  meclizine (ANTIVERT) 12.5 MG tablet Take 12.5 mg by mouth 3 (three) times daily as needed for dizziness.   Yes Historical Provider, MD  omeprazole (PRILOSEC) 20 MG capsule Take 20 mg by mouth daily.   Yes Historical Provider, MD  PARoxetine (PAXIL-CR) 25 MG 24 hr tablet Take 25 mg by mouth daily.   Yes Historical Provider, MD  sucralfate (CARAFATE) 1 g tablet Take 1 tablet by mouth 3 (three) times daily. 05/16/16  Yes Historical Provider, MD  ALPRAZolam Duanne Moron) 0.5 MG tablet Take 0.5 mg by mouth 2 (two) times daily as needed. 07/06/16   Historical Provider, MD  ondansetron (ZOFRAN-ODT) 4 MG disintegrating tablet Take 4 mg by mouth every 8 (eight) hours as needed for nausea or vomiting.    Historical Provider, MD      VITAL SIGNS:  Pulse 85, temperature 99 F (37.2 C), temperature source Oral, resp. rate 20, height 5\' 4"  (1.626 m), weight 63.5 kg (140 lb), SpO2 94 %.  PHYSICAL EXAMINATION:  GENERAL:  76 y.o.-year-old patient lying in the bed with no acute distress.  EYES: Pupils equal, round, reactive to light and accommodation. No scleral icterus. Extraocular muscles intact.  HEENT: Head atraumatic, normocephalic. Oropharynx and nasopharynx clear.  NECK:  Supple, no jugular venous distention. No thyroid enlargement, no tenderness.  LUNGS: Normal breath sounds bilaterally, no wheezing, rales,rhonchi or crepitation. No use of accessory muscles of respiration.  CARDIOVASCULAR: S1, S2 normal. No murmurs, rubs, or gallops.  ABDOMEN: Soft, nontender, nondistended. Bowel sounds present. No organomegaly or mass.  EXTREMITIES: No pedal edema, cyanosis, or clubbing.  NEUROLOGIC: Cranial nerves II through XII are intact. Muscle strength 5/5 in all extremities. Sensation intact. Gait not checked.  PSYCHIATRIC: The patient is alert and oriented x 3.  SKIN: No obvious rash, lesion, or  ulcer.   LABORATORY PANEL:   CBC  Recent Labs Lab 07/08/16 1253  WBC 5.9  HGB 11.9*  HCT 36.1  PLT 156   ------------------------------------------------------------------------------------------------------------------  Chemistries   Recent Labs Lab 07/08/16 1254  NA 143  K 5.0  CL 111  CO2 22  GLUCOSE 178*  BUN 84*  CREATININE 7.89*  CALCIUM 8.9   ------------------------------------------------------------------------------------------------------------------  Cardiac Enzymes No results for input(s): TROPONINI in the last 168 hours. ------------------------------------------------------------------------------------------------------------------  RADIOLOGY:  Ct Renal Stone Study  Result Date: 07/08/2016 CLINICAL DATA:  Right flank pain for 4 days, status post cholecystectomy. Status post appendectomy EXAM: CT ABDOMEN AND PELVIS WITHOUT CONTRAST TECHNIQUE: Multidetector CT imaging of the abdomen and pelvis was performed following the standard protocol without IV contrast. COMPARISON:  03/07/2016. FINDINGS: Lower chest: Lung bases shows no acute findings Hepatobiliary: No focal liver abnormality is seen. Status post cholecystectomy. No biliary dilatation. Pancreas: Unremarkable. No pancreatic ductal dilatation or surrounding inflammatory changes. Spleen: Normal in size without focal abnormality. Adrenals/Urinary Tract: No adrenal gland mass. There is a punctate nonobstructive calcification in upper pole of the left kidney  measures 2.4 mm. No hydronephrosis or hydroureter. No calcified ureteral calculi. No calcified calculi are noted within urinary bladder. No thickening of urinary bladder wall. Stomach/Bowel: No small bowel obstruction. No thickened or dilated small bowel loops. There is a low lying cecum. No pericecal inflammation. The patient is status post appendectomy. Some colonic stool and gas noted within transverse colon. Some colonic stool noted within descending colon  and sigmoid colon. Scattered diverticula are noted sigmoid colon and descending colon. No evidence of acute colitis or diverticulitis. No distal colonic obstruction. Vascular/Lymphatic: Mild atherosclerotic calcifications of abdominal aorta. No aortic aneurysm. No adenopathy. Reproductive: The uterus is atrophic.  No adnexal mass. Other: There is no evidence of ascites or free abdominal air. There is mild stranding of subcutaneous fat in anterior abdominal wall just right and left adjacent to umbilical region. This is stable from prior exam. Musculoskeletal: No destructive bony lesions are noted. There are degenerative changes lumbar spine with significant disc space flattening with endplate sclerotic changes and mild spinal canal stenosis at L2-L3 level. Stable about 9 mm anterolisthesis L5 on S1 vertebral body. Significant disc space flattening with vacuum disc phenomenon at L5-S1 level. 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 IMPRESSION: 1. Status post cholecystectomy. 2. There is left nonobstructive nephrolithiasis. No hydronephrosis or hydroureter. No calcified ureteral calculi. 3. No calcified calculi are noted within urinary bladder. 4. There is a low lying cecum. No pericecal inflammation. Status post appendectomy. No small bowel or colonic obstruction. 5. Moderate stool are noted descending colon and proximal sigmoid colon. Colonic diverticula are noted descending colon and sigmoid colon. No evidence of acute colitis or diverticulitis. 6. Stable degenerative changes lumbar spine most significant at L2-L3 and L5-S1 level. Stable anterolisthesis about 1 cm L5 on S1 vertebral body. Electronically Signed   By: Lahoma Crocker M.D.   On: 07/08/2016 14:58    EKG:   Orders placed or performed in visit on 01/18/13  . EKG 12-Lead    IMPRESSION AND PLAN:   Kelly [name]  is a 76 y.o. female with a known history of Diabetes mellitus, hypertension, chronic kidney stones is presenting to the ED with a  chief complaint of 4 day history of right flank pain. Patient was a seen by her primary care physician at [facility] internal medicine and outpatient labs have revealed elevated BUN at 81 and creatinine 7.26. The flank pain is intermittent associated with nausea but denies any vomiting. Denies any abdominal pain or burning micturition or decreased urination. No fevers. Patient is sent over to the emergency department and here creatinine is at 7.9 and BUN 84.  # Acute right flank pain with acute kidney injury-etiology unclear could be ATN Admit to MedSurg unit Hydrate with IV fluids CT renal body is normal UA with no protein Avoid nephrotoxins, holding patient's home medications enalapril , victoza and hydrochlorothiazide Patient is urinating at this time we will monitor intake and output Nephrology consult placed and discussed with on-call nephrologist Dr. Juleen China Patient is not hyperkalemic and urinating doesn't need acute dialysis at this time Will defer further evaluation to nephrology Check sedimentation rate, lipid panel  #Diabetes mellitus type 2 Check hemoglobin A1c Sliding scale  #Hypertension Hold hydrochlorothiazide and enalapril in view of acute kidney injury Continue home medication Norvasc At this point blood pressure is stable if necessary we will consider adding beta blocker  #Acute flank pain Will get urine culture and sensitivity  DVT prophylaxis with heparin subcutaneous  All the records are reviewed and  case discussed with ED provider. Management plans discussed with the patient, family and they are in agreement.  CODE STATUS: fc , Daughter is the healthcare power of attorney  TOTAL TIME TAKING CARE OF THIS PATIENT: 42 minutes.   Note: This dictation was prepared with Dragon dictation along with smaller phrase technology. Any transcriptional errors that result from this process are unintentional.  Nicholes Mango M.D on 07/08/2016 at 4:08 PM  Between 7am to 6pm -  Pager - [phone]  After 6pm go to [email or web address] - password [email or web address] [facility]  Office  [phone]  CC: Primary care physician; Albina Billet, MD

## 2016-07-08 NOTE — ED Triage Notes (Signed)
Pt complains of pain to right side for about 4 days. Pt had blood work done on 2/28 and received a call today saying her Creatinine was elevated and to come to ER for further evaluation.

## 2016-07-09 ENCOUNTER — Inpatient Hospital Stay: Payer: Medicare Other

## 2016-07-09 LAB — COMPREHENSIVE METABOLIC PANEL
ALT: 16 U/L (ref 14–54)
AST: 18 U/L (ref 15–41)
Albumin: 3 g/dL — ABNORMAL LOW (ref 3.5–5.0)
Alkaline Phosphatase: 58 U/L (ref 38–126)
Anion gap: 8 (ref 5–15)
BUN: 76 mg/dL — ABNORMAL HIGH (ref 6–20)
CO2: 22 mmol/L (ref 22–32)
Calcium: 8.4 mg/dL — ABNORMAL LOW (ref 8.9–10.3)
Chloride: 113 mmol/L — ABNORMAL HIGH (ref 101–111)
Creatinine, Ser: 7.28 mg/dL — ABNORMAL HIGH (ref 0.44–1.00)
GFR calc Af Amer: 6 mL/min — ABNORMAL LOW (ref >60)
GFR calc non Af Amer: 5 mL/min — ABNORMAL LOW (ref >60)
Glucose, Bld: 89 mg/dL (ref 65–99)
Potassium: 4.4 mmol/L (ref 3.5–5.1)
Sodium: 143 mmol/L (ref 135–145)
Total Bilirubin: 0.7 mg/dL (ref 0.3–1.2)
Total Protein: 5.5 g/dL — ABNORMAL LOW (ref 6.5–8.1)

## 2016-07-09 LAB — LIPID PANEL
Cholesterol: 100 mg/dL (ref 0–200)
HDL: 17 mg/dL — ABNORMAL LOW (ref >40)
LDL Cholesterol: 58 mg/dL (ref 0–99)
Total CHOL/HDL Ratio: 5.9 RATIO
Triglycerides: 123 mg/dL (ref <150)
VLDL: 25 mg/dL (ref 0–40)

## 2016-07-09 LAB — GLUCOSE, CAPILLARY
Glucose-Capillary: 86 mg/dL (ref 65–99)
Glucose-Capillary: 160 mg/dL — ABNORMAL HIGH (ref 65–99)
Glucose-Capillary: 84 mg/dL (ref 65–99)
Glucose-Capillary: 89 mg/dL (ref 65–99)
Glucose-Capillary: 139 mg/dL — ABNORMAL HIGH (ref 65–99)

## 2016-07-09 LAB — URINE CULTURE: Culture: NO GROWTH

## 2016-07-09 LAB — CBC
HCT: 31.8 % — ABNORMAL LOW (ref 35.0–47.0)
Hemoglobin: 10.6 g/dL — ABNORMAL LOW (ref 12.0–16.0)
MCH: 30.7 pg (ref 26.0–34.0)
MCHC: 33.5 g/dL (ref 32.0–36.0)
MCV: 91.7 fL (ref 80.0–100.0)
Platelets: 134 10*3/uL — ABNORMAL LOW (ref 150–440)
RBC: 3.46 MIL/uL — ABNORMAL LOW (ref 3.80–5.20)
RDW: 13.7 % (ref 11.5–14.5)
WBC: 4.8 10*3/uL (ref 3.6–11.0)

## 2016-07-09 LAB — SEDIMENTATION RATE: Sed Rate: 39 mm/hr — ABNORMAL HIGH (ref 0–30)

## 2016-07-09 LAB — TSH: TSH: 0.719 u[IU]/mL (ref 0.350–4.500)

## 2016-07-09 NOTE — Consult Note (Signed)
[facility]  CONSULT NOTE    Date: 07/09/2016                  Patient Name:  Kelly [name]  MRN: [id number]  DOB: November 08, 1940  Age / Sex: 76 y.o., female         PCP: Albina Billet, MD                 Service Requesting Consult: Dr. Margaretmary Eddy                 Reason for Consult: Acute renal failure            History of Present Illness: Kelly [name] is a 76 y.o. white female with insulin dependent diabetes mellitus type II, hypertension, hyperlipidemia, depression, anxiety, GERD, who was admitted to [facility] on 07/08/2016 for Right flank pain [R10.9] Acute renal failure, unspecified acute renal failure type [facility]) [N17.9]  Patient is with daughter at bedside. She states she was started on carafate on 1/8 by Dr. Vira Agar.   She reports some nausea and vomiting this week. No diarrhea. Left flank pain.   She has not been urinating until she came to the ED. Seems she continued her hydrochlorothiazide and enalapril while sick.   Denies active use of nonsteroidal anti-inflammatory agents.    Medications: Outpatient medications: Prescriptions Prior to Admission  Medication Sig Dispense Refill Last Dose  . amLODipine (NORVASC) 5 MG tablet Take 5 mg by mouth daily.    07/07/2016 at Unknown time  . aspirin EC 81 MG tablet Take 81 mg by mouth daily.   07/07/2016 at Unknown time  . atorvastatin (LIPITOR) 10 MG tablet Take 10 mg by mouth daily.    07/07/2016 at Unknown time  . calcium citrate-vitamin D (CITRACAL+D) 315-200 MG-UNIT tablet Take 1 tablet by mouth 2 (two) times daily.   07/07/2016 at Unknown time  . enalapril (VASOTEC) 20 MG tablet Take 20 mg by mouth daily.    07/07/2016 at Unknown time  . gabapentin (NEURONTIN) 300 MG capsule Take 300 mg by mouth at bedtime.   07/07/2016 at Unknown time  . glipiZIDE (GLUCOTROL XL) 10 MG 24 hr tablet Take 10 mg by mouth 2 (two) times daily.    07/07/2016 at Unknown time  . hydrochlorothiazide (HYDRODIURIL) 25 MG tablet Take  25 mg by mouth daily.   07/07/2016 at Unknown time  . Insulin Glargine (BASAGLAR KWIKPEN) 100 UNIT/ML SOPN Inject 16 Units into the skin at bedtime.    07/07/2016 at Unknown time  . liraglutide (VICTOZA) 18 MG/3ML SOPN Inject 18 mg into the skin daily.   07/07/2016 at Unknown time  . meclizine (ANTIVERT) 12.5 MG tablet Take 12.5 mg by mouth 3 (three) times daily as needed for dizziness.   prn at prn  . omeprazole (PRILOSEC) 20 MG capsule Take 20 mg by mouth daily.   07/07/2016 at Unknown time  . PARoxetine (PAXIL-CR) 25 MG 24 hr tablet Take 25 mg by mouth daily.   07/07/2016 at Unknown time  . sucralfate (CARAFATE) 1 g tablet Take 1 tablet by mouth 3 (three) times daily.   07/07/2016 at Unknown time  . ALPRAZolam (XANAX) 0.5 MG tablet Take 0.5 mg by mouth 2 (two) times daily as needed.   prn at prn  . ondansetron (ZOFRAN-ODT) 4 MG disintegrating tablet Take 4 mg by mouth every 8 (eight) hours as needed for nausea or vomiting.   prn at prn    Current medications:  Current Facility-Administered Medications  Medication Dose Route Frequency Provider Last Rate Last Dose  . 0.9 %  sodium chloride infusion   Intravenous Continuous Nicholes Mango, MD 100 mL/hr at 07/09/16 0424    . acetaminophen (TYLENOL) tablet 650 mg  650 mg Oral Q6H PRN Nicholes Mango, MD   650 mg at 07/08/16 1902   Or  . acetaminophen (TYLENOL) suppository 650 mg  650 mg Rectal Q6H PRN Nicholes Mango, MD      . ALPRAZolam Duanne Moron) tablet 0.5 mg  0.5 mg Oral BID PRN Nicholes Mango, MD      . amLODipine (NORVASC) tablet 5 mg  5 mg Oral Daily Nicholes Mango, MD   5 mg at 07/09/16 1002  . aspirin EC tablet 81 mg  81 mg Oral Daily Nicholes Mango, MD   81 mg at 07/09/16 1002  . atorvastatin (LIPITOR) tablet 10 mg  10 mg Oral Daily Nicholes Mango, MD   10 mg at 07/09/16 1002  . calcium-vitamin D (OSCAL WITH D) 500-200 MG-UNIT per tablet 1 tablet  1 tablet Oral BID Nicholes Mango, MD   1 tablet at 07/09/16 1003  . docusate sodium (COLACE) capsule 100 mg  100 mg Oral Daily PRN  Nicholes Mango, MD      . gabapentin (NEURONTIN) capsule 300 mg  300 mg Oral QHS Nicholes Mango, MD   300 mg at 07/08/16 2152  . glipiZIDE (GLUCOTROL XL) 24 hr tablet 10 mg  10 mg Oral BID WC Nicholes Mango, MD   10 mg at 07/09/16 1002  . heparin injection 5,000 Units  5,000 Units Subcutaneous Q8H Nicholes Mango, MD   5,000 Units at 07/09/16 0631  . insulin aspart (novoLOG) injection 0-5 Units  0-5 Units Subcutaneous QHS Nicholes Mango, MD   2 Units at 07/08/16 2151  . insulin aspart (novoLOG) injection 0-9 Units  0-9 Units Subcutaneous TID WC Nicholes Mango, MD   1 Units at 07/08/16 1751  . insulin glargine (LANTUS) injection 16 Units  16 Units Subcutaneous QHS Nicholes Mango, MD   16 Units at 07/08/16 2151  . meclizine (ANTIVERT) tablet 12.5 mg  12.5 mg Oral TID PRN Nicholes Mango, MD      . ondansetron (ZOFRAN-ODT) disintegrating tablet 4 mg  4 mg Oral Q8H PRN Aruna Gouru, MD      . pantoprazole (PROTONIX) EC tablet 40 mg  40 mg Oral Daily Nicholes Mango, MD   40 mg at 07/09/16 1002  . PARoxetine (PAXIL-CR) 24 hr tablet 25 mg  25 mg Oral Daily Nicholes Mango, MD   25 mg at 07/09/16 1002      Allergies: Allergies  Allergen Reactions  . Amoxicillin   . Morphine And Related       Past Medical History: Past Medical History:  Diagnosis Date  . Diabetes mellitus without complication ([place])   . Hypertension   . Kidney stones   . 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      Past Surgical History: Past Surgical History:  Procedure Laterality Date  . APPENDECTOMY    . CHOLECYSTECTOMY    . COLONOSCOPY    . ESOPHAGOGASTRODUODENOSCOPY    . ESOPHAGOGASTRODUODENOSCOPY (EGD) WITH PROPOFOL N/A 03/23/2016   Procedure: ESOPHAGOGASTRODUODENOSCOPY (EGD) WITH PROPOFOL;  Surgeon: Manya Silvas, MD;  Location: [facility] END[facility]COPY;  Service: Endoscopy;  Laterality: N/A;  . RECTAL PROLAPSE REPAIR     x2     Family History: No family history on file.   Social History: Social  History   Social  History  . Marital status: Married    Spouse name: N/A  . Number of children: N/A  . Years of education: N/A   Occupational History  . Not on file.   Social History Main Topics  . Smoking status: Never Smoker  . Smokeless tobacco: Never Used  . Alcohol use No  . Drug use: No  . Sexual activity: Not on file   Other Topics Concern  . Not on file   Social History Narrative  . No narrative on file     Review of Systems: Review of Systems  Constitutional: Positive for malaise/fatigue. Negative for chills, diaphoresis, fever and weight loss.  HENT: Negative for congestion, ear discharge, ear pain, hearing loss, nosebleeds, sinus pain, sore throat and tinnitus.   Eyes: Negative.  Negative for blurred vision, double vision, photophobia, pain, discharge and redness.  Respiratory: Negative for cough, hemoptysis, sputum production, shortness of breath, wheezing and stridor.   Cardiovascular: Negative.  Negative for chest pain, palpitations, orthopnea, claudication, leg swelling and PND.  Gastrointestinal: Positive for abdominal pain, heartburn, nausea and vomiting. Negative for blood in stool, constipation, diarrhea and melena.  Genitourinary: Negative.  Negative for dysuria, flank pain, frequency, hematuria and urgency.  Musculoskeletal: Negative.  Negative for back pain, falls, joint pain, myalgias and neck pain.  Skin: Negative.  Negative for itching and rash.  Neurological: Positive for weakness. Negative for dizziness, tingling, tremors, sensory change, speech change, focal weakness, seizures, loss of consciousness and headaches.  Endo/Heme/Allergies: Negative for environmental allergies and polydipsia. Does not bruise/bleed easily.  Psychiatric/Behavioral: Negative.  Negative for depression, hallucinations, memory loss, substance abuse and suicidal ideas. The patient is not nervous/anxious and does not have insomnia.     Vital Signs: Blood pressure 132/63, pulse 79, temperature  98.4 F (36.9 C), temperature source Oral, resp. rate 18, height 5\' 4"  (1.626 m), weight 63.5 kg (140 lb), SpO2 98 %.  Weight trends: Filed Weights   07/08/16 1247 07/08/16 1252  Weight: 63.5 kg (140 lb) 63.5 kg (140 lb)    Physical Exam: General: NAD, laying in bed  Head: Normocephalic, atraumatic. Moist oral mucosal membranes  Eyes: Anicteric, PERRL  Neck: Supple, trachea midline  Lungs:  Clear to auscultation  Heart: Regular rate and rhythm  Abdomen:  Soft, nontender,   Extremities: no peripheral edema.  Neurologic: Nonfocal, moving all four extremities  Skin: No lesions        Lab results: Basic Metabolic Panel:  Recent Labs Lab 07/08/16 1254 07/09/16 0312  NA 143 143  K 5.0 4.4  CL 111 113*  CO2 22 22  GLUCOSE 178* 89  BUN 84* 76*  CREATININE 7.89* 7.28*  CALCIUM 8.9 8.4*    Liver Function Tests:  Recent Labs Lab 07/09/16 0312  AST 18  ALT 16  ALKPHOS 58  BILITOT 0.7  PROT 5.5*  ALBUMIN 3.0*   No results for input(s): LIPASE, AMYLASE in the last 168 hours. No results for input(s): AMMONIA in the last 168 hours.  CBC:  Recent Labs Lab 07/08/16 1253 07/09/16 0312  WBC 5.9 4.8  NEUTROABS 4.1  --   HGB 11.9* 10.6*  HCT 36.1 31.8*  MCV 91.3 91.7  PLT 156 134*    Cardiac Enzymes: No results for input(s): CKTOTAL, CKMB, CKMBINDEX, TROPONINI in the last 168 hours.  BNP: Invalid input(s): POCBNP  CBG:  Recent Labs Lab 07/08/16 1253 07/08/16 1720 07/08/16 2107 07/09/16 0725  GLUCAP 157* 140* 201* 86  Microbiology: No results found for this or any previous visit.  Coagulation Studies: No results for input(s): LABPROT, INR in the last 72 hours.  Urinalysis:  Recent Labs  07/08/16 1254  COLORURINE STRAW*  LABSPEC 1.010  PHURINE 5.0  GLUCOSEU NEGATIVE  HGBUR NEGATIVE  BILIRUBINUR NEGATIVE  KETONESUR NEGATIVE  PROTEINUR NEGATIVE  NITRITE NEGATIVE  LEUKOCYTESUR NEGATIVE      Imaging: Ct Renal Stone Study  Result  Date: 07/08/2016 CLINICAL DATA:  Right flank pain for 4 days, status post cholecystectomy. Status post appendectomy EXAM: CT ABDOMEN AND PELVIS WITHOUT CONTRAST TECHNIQUE: Multidetector CT imaging of the abdomen and pelvis was performed following the standard protocol without IV contrast. COMPARISON:  03/07/2016. FINDINGS: Lower chest: Lung bases shows no acute findings Hepatobiliary: No focal liver abnormality is seen. Status post cholecystectomy. No biliary dilatation. Pancreas: Unremarkable. No pancreatic ductal dilatation or surrounding inflammatory changes. Spleen: Normal in size without focal abnormality. Adrenals/Urinary Tract: No adrenal gland mass. There is a punctate nonobstructive calcification in upper pole of the left kidney measures 2.4 mm. No hydronephrosis or hydroureter. No calcified ureteral calculi. No calcified calculi are noted within urinary bladder. No thickening of urinary bladder wall. Stomach/Bowel: No small bowel obstruction. No thickened or dilated small bowel loops. There is a low lying cecum. No pericecal inflammation. The patient is status post appendectomy. Some colonic stool and gas noted within transverse colon. Some colonic stool noted within descending colon and sigmoid colon. Scattered diverticula are noted sigmoid colon and descending colon. No evidence of acute colitis or diverticulitis. No distal colonic obstruction. Vascular/Lymphatic: Mild atherosclerotic calcifications of abdominal aorta. No aortic aneurysm. No adenopathy. Reproductive: The uterus is atrophic.  No adnexal mass. Other: There is no evidence of ascites or free abdominal air. There is mild stranding of subcutaneous fat in anterior abdominal wall just right and left adjacent to umbilical region. This is stable from prior exam. Musculoskeletal: No destructive bony lesions are noted. There are degenerative changes lumbar spine with significant disc space flattening with endplate sclerotic changes and mild spinal  canal stenosis at L2-L3 level. Stable about 9 mm anterolisthesis L5 on S1 vertebral body. Significant disc space flattening with vacuum disc phenomenon at L5-S1 level. 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 IMPRESSION: 1. Status post cholecystectomy. 2. There is left nonobstructive nephrolithiasis. No hydronephrosis or hydroureter. No calcified ureteral calculi. 3. No calcified calculi are noted within urinary bladder. 4. There is a low lying cecum. No pericecal inflammation. Status post appendectomy. No small bowel or colonic obstruction. 5. Moderate stool are noted descending colon and proximal sigmoid colon. Colonic diverticula are noted descending colon and sigmoid colon. No evidence of acute colitis or diverticulitis. 6. Stable degenerative changes lumbar spine most significant at L2-L3 and L5-S1 level. Stable anterolisthesis about 1 cm L5 on S1 vertebral body. Electronically Signed   By: Lahoma Crocker M.D.   On: 07/08/2016 14:58      Assessment & Plan: Kelly [name] is a 76 y.o. white female with insulin dependent diabetes mellitus type II, hypertension, hyperlipidemia, depression, anxiety, GERD, who was admitted to [facility] on 07/08/2016 for Right flank pain [R10.9] Acute renal failure, unspecified acute renal failure type ([place]) [N17.9]  1. Acute renal failure on chronic kidney disease stage III: bland urine on urinalysis. Baseline creatinine of 1.4, GFR of 35 02/2016. Chronic kidney disease secondary to diabetic nephropathy.  Acute renal failure with bland urinalysis and electrolytes at goal. Oliguric urine output. No indication for dialysis.  Discussed renal  failure with patient and daughter. Suspect this is prerenal azotemia leading to ATN. Less likely due to glomerulonephritis or other intrinsic renal pathology.  - check SPEP/UPEP, renal ultrasound, ANA, anti-GBM, ANCA and viral hepatitis screen. - hold enalapril and hydrochlorothiazide. - Continue IV fluids: NS at 129mL/hr -  monitor urine output, volume status and renal function - renally dose all medications.  - Discussed role of biopsy with patient.  2. Hypertension:  Blood pressure at goal. Holding enalapril and hydrochlorothiazide - amlodipine.   3. Diabetes mellitus type II with chronic kidney disease: insulin dependent. Hemoglobin A1c pending.   LOS: Melvin, Salik Grewell 3/3/201811:18 AM

## 2016-07-09 NOTE — Progress Notes (Signed)
[place] [facility] [place] NAME: Kelly [name]    MR#:  [id number]  DATE OF BIRTH:  1940/12/28  SUBJECTIVE: admitted  for right flank pain, acute renal failure with creatinine 7.9 with BUN 84 on admission . she denies shortness of breath, nausea, vomiting, dysuria. Urine output is normal according to her. Patient's daughter mentioned that she is more confused. Started on sucralfate 2 weeks ago. Family is wondering if sucralfate caused her kidney damage.   CHIEF COMPLAINT:   Chief Complaint  Patient presents with  . Abnormal Lab  . Flank Pain    REVIEW OF SYSTEMS:    Review of Systems  Constitutional: Negative for chills and fever.  HENT: Negative for hearing loss.   Eyes: Negative for blurred vision, double vision and photophobia.  Respiratory: Negative for cough, hemoptysis and shortness of breath.   Cardiovascular: Negative for palpitations, orthopnea and leg swelling.  Gastrointestinal: Negative for abdominal pain, diarrhea and vomiting.  Genitourinary: Negative for dysuria and urgency.  Musculoskeletal: Negative for myalgias and neck pain.  Skin: Negative for rash.  Neurological: Negative for dizziness, focal weakness, seizures, weakness and headaches.  Psychiatric/Behavioral: Negative for memory loss. The patient does not have insomnia.     Nutrition:  Tolerating Diet: Tolerating PT:      DRUG ALLERGIES:   Allergies  Allergen Reactions  . Amoxicillin   . Morphine And Related     VITALS:  Blood pressure 132/63, pulse 79, temperature 98.4 F (36.9 C), temperature source Oral, resp. rate 18, height 5\' 4"  (1.626 m), weight 63.5 kg (140 lb), SpO2 98 %.  PHYSICAL EXAMINATION:   Physical Exam  GENERAL:  76 y.o.-year-old patient lying in the bed with no acute distress.  EYES: Pupils equal, round, reactive to light and accommodation. No scleral icterus. Extraocular muscles intact.  HEENT: Head atraumatic,  normocephalic. Oropharynx and nasopharynx clear.  NECK:  Supple, no jugular venous distention. No thyroid enlargement, no tenderness.  LUNGS: Normal breath sounds bilaterally, no wheezing, rales,rhonchi or crepitation. No use of accessory muscles of respiration.  CARDIOVASCULAR: S1, S2 normal. No murmurs, rubs, or gallops.  ABDOMEN: Soft, nontender, nondistended. Bowel sounds present. No organomegaly or mass.  EXTREMITIES: No pedal edema, cyanosis, or clubbing.  NEUROLOGIC: Cranial nerves II through XII are intact. Muscle strength 5/5 in all extremities. Sensation intact. Gait not checked.  PSYCHIATRIC: The patient is alert and oriented x 3.  SKIN: No obvious rash, lesion, or ulcer.    LABORATORY PANEL:   CBC  Recent Labs Lab 07/09/16 0312  WBC 4.8  HGB 10.6*  HCT 31.8*  PLT 134*   ------------------------------------------------------------------------------------------------------------------  Chemistries   Recent Labs Lab 07/09/16 0312  NA 143  K 4.4  CL 113*  CO2 22  GLUCOSE 89  BUN 76*  CREATININE 7.28*  CALCIUM 8.4*  AST 18  ALT 16  ALKPHOS 58  BILITOT 0.7   ------------------------------------------------------------------------------------------------------------------  Cardiac Enzymes No results for input(s): TROPONINI in the last 168 hours. ------------------------------------------------------------------------------------------------------------------  RADIOLOGY:  Ct Renal Stone Study  Result Date: 07/08/2016 CLINICAL DATA:  Right flank pain for 4 days, status post cholecystectomy. Status post appendectomy EXAM: CT ABDOMEN AND PELVIS WITHOUT CONTRAST TECHNIQUE: Multidetector CT imaging of the abdomen and pelvis was performed following the standard protocol without IV contrast. COMPARISON:  03/07/2016. FINDINGS: Lower chest: Lung bases shows no acute findings Hepatobiliary: No focal liver abnormality is seen. Status post cholecystectomy. No biliary  dilatation. Pancreas: Unremarkable. No pancreatic ductal dilatation  or surrounding inflammatory changes. Spleen: Normal in size without focal abnormality. Adrenals/Urinary Tract: No adrenal gland mass. There is a punctate nonobstructive calcification in upper pole of the left kidney measures 2.4 mm. No hydronephrosis or hydroureter. No calcified ureteral calculi. No calcified calculi are noted within urinary bladder. No thickening of urinary bladder wall. Stomach/Bowel: No small bowel obstruction. No thickened or dilated small bowel loops. There is a low lying cecum. No pericecal inflammation. The patient is status post appendectomy. Some colonic stool and gas noted within transverse colon. Some colonic stool noted within descending colon and sigmoid colon. Scattered diverticula are noted sigmoid colon and descending colon. No evidence of acute colitis or diverticulitis. No distal colonic obstruction. Vascular/Lymphatic: Mild atherosclerotic calcifications of abdominal aorta. No aortic aneurysm. No adenopathy. Reproductive: The uterus is atrophic.  No adnexal mass. Other: There is no evidence of ascites or free abdominal air. There is mild stranding of subcutaneous fat in anterior abdominal wall just right and left adjacent to umbilical region. This is stable from prior exam. Musculoskeletal: No destructive bony lesions are noted. There are degenerative changes lumbar spine with significant disc space flattening with endplate sclerotic changes and mild spinal canal stenosis at L2-L3 level. Stable about 9 mm anterolisthesis L5 on S1 vertebral body. Significant disc space flattening with vacuum disc phenomenon at L5-S1 level. 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 IMPRESSION: 1. Status post cholecystectomy. 2. There is left nonobstructive nephrolithiasis. No hydronephrosis or hydroureter. No calcified ureteral calculi. 3. No calcified calculi are noted within urinary bladder. 4. There is a low lying cecum. No  pericecal inflammation. Status post appendectomy. No small bowel or colonic obstruction. 5. Moderate stool are noted descending colon and proximal sigmoid colon. Colonic diverticula are noted descending colon and sigmoid colon. No evidence of acute colitis or diverticulitis. 6. Stable degenerative changes lumbar spine most significant at L2-L3 and L5-S1 level. Stable anterolisthesis about 1 cm L5 on S1 vertebral body. Electronically Signed   By: Lahoma Crocker M.D.   On: 07/08/2016 14:58     ASSESSMENT AND PLAN:   Active Problems:   AKI (acute kidney injury) ([place])  1. acute right flank pain with acute renal failure likely due to ATN: CT of the kidneys did not show any hydronephrosis. Ultrasound of kidneys ordered today. Continue IV hydration and monitor kidney function closely, patient has no shortness of breath or hyperkalemia at this time continue IV hydration, nephrology is consulted, I spoke with nephrology about this patient. Spoke with patient's daughter in detail.  Avoid diuretics, lisinopril. #2 diabetes mellitus type 2: Continue glipizide, exercise:  #3 essential hypertension: Controlled #4 GERD: Continue PPI but the sucralfate is stopped  All the records are reviewed and case discussed with Care Management/Social Workerr. Management plans discussed with the patient, family and they are in agreement.  CODE STATUS:full  TOTAL TIME TAKING CARE OF THIS PATIENT: 35 minutes.   POSSIBLE D/C IN 1-2 DAYS, DEPENDING ON CLINICAL CONDITION.   Epifanio Lesches M.D on 07/09/2016 at 11:34 AM  Between 7am to 6pm - Pager - [phone]  After 6pm go to [email or web address] - password [email or web address] [facility]  Office  [phone]  CC: Primary care physician; Albina Billet, MD

## 2016-07-09 NOTE — Plan of Care (Signed)
Problem: Safety: Goal: Ability to remain free from injury will improve Outcome: Progressing Discussed with patient the need to call for assistance before ambulating

## 2016-07-10 LAB — BASIC METABOLIC PANEL
Anion gap: 6 (ref 5–15)
BUN: 69 mg/dL — ABNORMAL HIGH (ref 6–20)
CO2: 22 mmol/L (ref 22–32)
Calcium: 8.5 mg/dL — ABNORMAL LOW (ref 8.9–10.3)
Chloride: 116 mmol/L — ABNORMAL HIGH (ref 101–111)
Creatinine, Ser: 6.97 mg/dL — ABNORMAL HIGH (ref 0.44–1.00)
GFR calc Af Amer: 6 mL/min — ABNORMAL LOW (ref >60)
GFR calc non Af Amer: 5 mL/min — ABNORMAL LOW (ref >60)
Glucose, Bld: 76 mg/dL (ref 65–99)
Potassium: 4.7 mmol/L (ref 3.5–5.1)
Sodium: 144 mmol/L (ref 135–145)

## 2016-07-10 LAB — GLUCOSE, CAPILLARY
Glucose-Capillary: 139 mg/dL — ABNORMAL HIGH (ref 65–99)
Glucose-Capillary: 43 mg/dL — CL (ref 65–99)
Glucose-Capillary: 265 mg/dL — ABNORMAL HIGH (ref 65–99)
Glucose-Capillary: 41 mg/dL — CL (ref 65–99)
Glucose-Capillary: 47 mg/dL — ABNORMAL LOW (ref 65–99)
Glucose-Capillary: 97 mg/dL (ref 65–99)
Glucose-Capillary: 156 mg/dL — ABNORMAL HIGH (ref 65–99)

## 2016-07-10 LAB — HEMOGLOBIN A1C
Hgb A1c MFr Bld: 7.1 % — ABNORMAL HIGH (ref 4.8–5.6)
Mean Plasma Glucose: 157 mg/dL

## 2016-07-10 LAB — HEPATITIS B SURFACE ANTIGEN: Hepatitis B Surface Ag: NEGATIVE

## 2016-07-10 LAB — HEPATITIS C ANTIBODY: HCV Ab: <0.1 s/co ratio (ref 0.0–0.9)

## 2016-07-10 LAB — HEPATITIS B CORE ANTIBODY, IGM: Hep B C IgM: NEGATIVE

## 2016-07-10 LAB — HEPATITIS B SURFACE ANTIBODY,QUALITATIVE: Hep B S Ab: REACTIVE

## 2016-07-10 MED ORDER — SODIUM CHLORIDE 0.9 % IV SOLN
INTRAVENOUS | Status: AC
  Administered 2016-07-10 (×2): via INTRAVENOUS

## 2016-07-10 MED ORDER — DEXTROSE 50 % IV SOLN
INTRAVENOUS | Status: AC
  Administered 2016-07-10: 08:00:00
  Filled 2016-07-10: qty 50

## 2016-07-10 MED ORDER — DEXTROSE 50 % IV SOLN
25.0000 g | Freq: Once | INTRAVENOUS | Status: DC
  Filled 2016-07-10: qty 50

## 2016-07-10 MED ORDER — INSULIN GLARGINE 100 UNIT/ML ~~LOC~~ SOLN
12.0000 [IU] | Freq: Every day | SUBCUTANEOUS | Status: DC
  Filled 2016-07-10 (×2): qty 0.12

## 2016-07-10 NOTE — Progress Notes (Signed)
[facility] [place] NAME: Kelly [name]    MR#:  [id number]  DATE OF BIRTH:  January 10, 1941  SUBJECTIVE: No complaints, no nausea. Urine output 2  litres  Yesterday.  CHIEF COMPLAINT:   Chief Complaint  Patient presents with  . Abnormal Lab  . Flank Pain    REVIEW OF SYSTEMS:    Review of Systems  Constitutional: Negative for chills and fever.  HENT: Negative for hearing loss.   Eyes: Negative for blurred vision, double vision and photophobia.  Respiratory: Negative for cough, hemoptysis and shortness of breath.   Cardiovascular: Negative for palpitations, orthopnea and leg swelling.  Gastrointestinal: Negative for abdominal pain, diarrhea and vomiting.  Genitourinary: Negative for dysuria and urgency.  Musculoskeletal: Negative for myalgias and neck pain.  Skin: Negative for rash.  Neurological: Negative for dizziness, focal weakness, seizures, weakness and headaches.  Psychiatric/Behavioral: Negative for memory loss. The patient does not have insomnia.     Nutrition:  Tolerating Diet: Tolerating PT:      DRUG ALLERGIES:   Allergies  Allergen Reactions  . Amoxicillin   . Morphine And Related     VITALS:  Blood pressure (!) 147/61, pulse 78, temperature 99.2 F (37.3 C), temperature source Oral, resp. rate 18, height 5\' 4"  (1.626 m), weight 63.5 kg (140 lb), SpO2 97 %.  PHYSICAL EXAMINATION:   Physical Exam  GENERAL:  76 y.o.-year-old patient lying in the bed with no acute distress.  EYES: Pupils equal, round, reactive to light and accommodation. No scleral icterus. Extraocular muscles intact.  HEENT: Head atraumatic, normocephalic. Oropharynx and nasopharynx clear.  NECK:  Supple, no jugular venous distention. No thyroid enlargement, no tenderness.  LUNGS: Normal breath sounds bilaterally, no wheezing, rales,rhonchi or crepitation. No use of accessory muscles of respiration.  CARDIOVASCULAR: S1, S2  normal. No murmurs, rubs, or gallops.  ABDOMEN: Soft, nontender, nondistended. Bowel sounds present. No organomegaly or mass.  EXTREMITIES: No pedal edema, cyanosis, or clubbing.  NEUROLOGIC: Cranial nerves II through XII are intact. Muscle strength 5/5 in all extremities. Sensation intact. Gait not checked.  PSYCHIATRIC: The patient is alert and oriented x 3.  SKIN: No obvious rash, lesion, or ulcer.    LABORATORY PANEL:   CBC  Recent Labs Lab 07/09/16 0312  WBC 4.8  HGB 10.6*  HCT 31.8*  PLT 134*   ------------------------------------------------------------------------------------------------------------------  Chemistries   Recent Labs Lab 07/09/16 0312 07/10/16 0314  NA 143 144  K 4.4 4.7  CL 113* 116*  CO2 22 22  GLUCOSE 89 76  BUN 76* 69*  CREATININE 7.28* 6.97*  CALCIUM 8.4* 8.5*  AST 18  --   ALT 16  --   ALKPHOS 58  --   BILITOT 0.7  --    ------------------------------------------------------------------------------------------------------------------  Cardiac Enzymes No results for input(s): TROPONINI in the last 168 hours. ------------------------------------------------------------------------------------------------------------------  RADIOLOGY:  US Renal  Result Date: 07/09/2016 CLINICAL DATA:  Acute renal failure. EXAM: RENAL / URINARY TRACT ULTRASOUND COMPLETE COMPARISON:  CT 07/08/2016 FINDINGS: Right Kidney: Length: 9.8 cm. Increased echogenicity throughout the right renal cortex. No significant cortical thinning. Negative for hydronephrosis. No suspicious mass lesion. Left Kidney: Length: 8.7 cm. 0.4 cm echogenic focus in the left kidney upper pole region. This is compatible with a nonobstructive small stone. Similar finding was present on the recent CT. Increased echogenicity in the left kidney with mild cortical thinning. Negative for hydronephrosis. No suspicious renal lesion. Bladder: Appears normal for degree of bladder  distention.  IMPRESSION: Both kidneys demonstrate increased echogenicity. Findings could represent chronic medical renal disease. Negative for hydronephrosis. Nonobstructive small stone in left kidney. Electronically Signed   By: Markus Daft M.D.   On: 07/09/2016 11:42   Ct Renal Stone Study  Result Date: 07/08/2016 CLINICAL DATA:  Right flank pain for 4 days, status post cholecystectomy. Status post appendectomy EXAM: CT ABDOMEN AND PELVIS WITHOUT CONTRAST TECHNIQUE: Multidetector CT imaging of the abdomen and pelvis was performed following the standard protocol without IV contrast. COMPARISON:  03/07/2016. FINDINGS: Lower chest: Lung bases shows no acute findings Hepatobiliary: No focal liver abnormality is seen. Status post cholecystectomy. No biliary dilatation. Pancreas: Unremarkable. No pancreatic ductal dilatation or surrounding inflammatory changes. Spleen: Normal in size without focal abnormality. Adrenals/Urinary Tract: No adrenal gland mass. There is a punctate nonobstructive calcification in upper pole of the left kidney measures 2.4 mm. No hydronephrosis or hydroureter. No calcified ureteral calculi. No calcified calculi are noted within urinary bladder. No thickening of urinary bladder wall. Stomach/Bowel: No small bowel obstruction. No thickened or dilated small bowel loops. There is a low lying cecum. No pericecal inflammation. The patient is status post appendectomy. Some colonic stool and gas noted within transverse colon. Some colonic stool noted within descending colon and sigmoid colon. Scattered diverticula are noted sigmoid colon and descending colon. No evidence of acute colitis or diverticulitis. No distal colonic obstruction. Vascular/Lymphatic: Mild atherosclerotic calcifications of abdominal aorta. No aortic aneurysm. No adenopathy. Reproductive: The uterus is atrophic.  No adnexal mass. Other: There is no evidence of ascites or free abdominal air. There is mild stranding of subcutaneous fat in  anterior abdominal wall just right and left adjacent to umbilical region. This is stable from prior exam. Musculoskeletal: No destructive bony lesions are noted. There are degenerative changes lumbar spine with significant disc space flattening with endplate sclerotic changes and mild spinal canal stenosis at L2-L3 level. Stable about 9 mm anterolisthesis L5 on S1 vertebral body. Significant disc space flattening with vacuum disc phenomenon at L5-S1 level. 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 IMPRESSION: 1. Status post cholecystectomy. 2. There is left nonobstructive nephrolithiasis. No hydronephrosis or hydroureter. No calcified ureteral calculi. 3. No calcified calculi are noted within urinary bladder. 4. There is a low lying cecum. No pericecal inflammation. Status post appendectomy. No small bowel or colonic obstruction. 5. Moderate stool are noted descending colon and proximal sigmoid colon. Colonic diverticula are noted descending colon and sigmoid colon. No evidence of acute colitis or diverticulitis. 6. Stable degenerative changes lumbar spine most significant at L2-L3 and L5-S1 level. Stable anterolisthesis about 1 cm L5 on S1 vertebral body. Electronically Signed   By: Lahoma Crocker M.D.   On: 07/08/2016 14:58     ASSESSMENT AND PLAN:   Active Problems:   AKI (acute kidney injury) ([place])  1. acute right flank pain with acute renal failure likely due to ATN: CT of the kidneys did not show any hydronephrosis. Ultrasound of kidneys showed no hydro.medical renal disease,. Continue IV hydration and monitor kidney function closely, pending labs SPEP, UPEP, ANCA, anti-GBM, ANCA. Avoid diuretics, lisinopril. Acute renal failure on chronic kidney disease stage III: bland urine on urinalysis. 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 Management plans discussed with the patient, family and they are in agreement.  CODE STATUS:full  TOTAL TIME TAKING CARE OF THIS PATIENT: 35 minutes.   POSSIBLE D/C IN 1-2 DAYS, DEPENDING ON CLINICAL CONDITION.   Epifanio Lesches M.D on 07/10/2016 at 12:35 PM  Between 7am to 6pm - Pager - [phone]  After 6pm go to [email or web address] - password [email or web address] [facility]  Office  [phone]  CC: Primary care physician; Albina Billet, MD

## 2016-07-10 NOTE — Progress Notes (Signed)
Dr Ara Kussmaul notified of bloodsugar drop this am and dinner time, stated to hold pm dose of lantus

## 2016-07-10 NOTE — Plan of Care (Signed)
Problem: Physical Regulation: Goal: Will remain free from infection Outcome: Progressing Educated the patient on the need to always wash her hands when she goes not the bathroom

## 2016-07-10 NOTE — Progress Notes (Signed)
[facility]  ROUNDING NOTE   Subjective:   No complaints. No nausea. Finished her breakfast.   UOP 2100  NS at 145mL/hr  Objective:  Vital signs in last 24 hours:  Temp:  [98 F (36.7 C)-99.4 F (37.4 C)] 98.4 F (36.9 C) (03/04 0511) Pulse Rate:  [73-106] 74 (03/04 0511) Resp:  [12-18] 18 (03/04 0511) BP: (118-150)/(56-74) 125/60 (03/04 0511) SpO2:  [93 %-98 %] 93 % (03/04 0511)  Weight change:  Filed Weights   07/08/16 1247 07/08/16 1252  Weight: 63.5 kg (140 lb) 63.5 kg (140 lb)    Intake/Output: I/O last 3 completed shifts: In: 1897 [P.O.:360; I.V.:1537] Out: 2801 [Urine:2800; Stool:1]   Intake/Output this shift:  Total I/O In: -  Out: 350 [Urine:350]  Physical Exam: General: NAD, laying in bed  Head: Normocephalic, atraumatic. Moist oral mucosal membranes  Eyes: Anicteric, PERRL  Neck: Supple, trachea midline  Lungs:  Clear to auscultation  Heart: Regular rate and rhythm  Abdomen:  Soft, nontender,   Extremities:  no peripheral edema.  Neurologic: Nonfocal, moving all four extremities  Skin: No lesions       Basic Metabolic Panel:  Recent Labs Lab 07/08/16 1254 07/09/16 0312 07/10/16 0314  NA 143 143 144  K 5.0 4.4 4.7  CL 111 113* 116*  CO2 22 22 22   GLUCOSE 178* 89 76  BUN 84* 76* 69*  CREATININE 7.89* 7.28* 6.97*  CALCIUM 8.9 8.4* 8.5*    Liver Function Tests:  Recent Labs Lab 07/09/16 0312  AST 18  ALT 16  ALKPHOS 58  BILITOT 0.7  PROT 5.5*  ALBUMIN 3.0*   No results for input(s): LIPASE, AMYLASE in the last 168 hours. No results for input(s): AMMONIA in the last 168 hours.  CBC:  Recent Labs Lab 07/08/16 1253 07/09/16 0312  WBC 5.9 4.8  NEUTROABS 4.1  --   HGB 11.9* 10.6*  HCT 36.1 31.8*  MCV 91.3 91.7  PLT 156 134*    Cardiac Enzymes: No results for input(s): CKTOTAL, CKMB, CKMBINDEX, TROPONINI in the last 168 hours.  BNP: Invalid input(s): POCBNP  CBG:  Recent Labs Lab 07/09/16 1629  07/09/16 1723 07/09/16 2109 07/10/16 0734 07/10/16 0800  GLUCAP 84 19 139* 65* 139*    Microbiology: Results for orders placed or performed during the hospital encounter of 07/08/16  Urine culture     Status: None   Collection Time: 07/08/16 12:54 PM  Result Value Ref Range Status   Specimen Description URINE, CLEAN CATCH  Final   Special Requests NONE  Final   Culture   Final    NO GROWTH Performed at [facility], [place] [address] [place], [place] [address]    Report Status 07/09/2016 FINAL  Final    Coagulation Studies: No results for input(s): LABPROT, INR in the last 72 hours.  Urinalysis:  Recent Labs  07/08/16 1254  COLORURINE STRAW*  LABSPEC 1.010  PHURINE 5.0  GLUCOSEU NEGATIVE  HGBUR NEGATIVE  BILIRUBINUR NEGATIVE  KETONESUR NEGATIVE  PROTEINUR NEGATIVE  NITRITE NEGATIVE  LEUKOCYTESUR NEGATIVE      Imaging: US Renal  Result Date: 07/09/2016 CLINICAL DATA:  Acute renal failure. EXAM: RENAL / URINARY TRACT ULTRASOUND COMPLETE COMPARISON:  CT 07/08/2016 FINDINGS: Right Kidney: Length: 9.8 cm. Increased echogenicity throughout the right renal cortex. No significant cortical thinning. Negative for hydronephrosis. No suspicious mass lesion. Left Kidney: Length: 8.7 cm. 0.4 cm echogenic focus in the left kidney upper pole region. This is compatible with  a nonobstructive small stone. Similar finding was present on the recent CT. Increased echogenicity in the left kidney with mild cortical thinning. Negative for hydronephrosis. No suspicious renal lesion. Bladder: Appears normal for degree of bladder distention. IMPRESSION: Both kidneys demonstrate increased echogenicity. Findings could represent chronic medical renal disease. Negative for hydronephrosis. Nonobstructive small stone in left kidney. Electronically Signed   By: Markus Daft M.D.   On: 07/09/2016 11:42   Ct Renal Stone Study  Result Date: 07/08/2016 CLINICAL DATA:  Right flank pain for 4 days, status  post cholecystectomy. Status post appendectomy EXAM: CT ABDOMEN AND PELVIS WITHOUT CONTRAST TECHNIQUE: Multidetector CT imaging of the abdomen and pelvis was performed following the standard protocol without IV contrast. COMPARISON:  03/07/2016. FINDINGS: Lower chest: Lung bases shows no acute findings Hepatobiliary: No focal liver abnormality is seen. Status post cholecystectomy. No biliary dilatation. Pancreas: Unremarkable. No pancreatic ductal dilatation or surrounding inflammatory changes. Spleen: Normal in size without focal abnormality. Adrenals/Urinary Tract: No adrenal gland mass. There is a punctate nonobstructive calcification in upper pole of the left kidney measures 2.4 mm. No hydronephrosis or hydroureter. No calcified ureteral calculi. No calcified calculi are noted within urinary bladder. No thickening of urinary bladder wall. Stomach/Bowel: No small bowel obstruction. No thickened or dilated small bowel loops. There is a low lying cecum. No pericecal inflammation. The patient is status post appendectomy. Some colonic stool and gas noted within transverse colon. Some colonic stool noted within descending colon and sigmoid colon. Scattered diverticula are noted sigmoid colon and descending colon. No evidence of acute colitis or diverticulitis. No distal colonic obstruction. Vascular/Lymphatic: Mild atherosclerotic calcifications of abdominal aorta. No aortic aneurysm. No adenopathy. Reproductive: The uterus is atrophic.  No adnexal mass. Other: There is no evidence of ascites or free abdominal air. There is mild stranding of subcutaneous fat in anterior abdominal wall just right and left adjacent to umbilical region. This is stable from prior exam. Musculoskeletal: No destructive bony lesions are noted. There are degenerative changes lumbar spine with significant disc space flattening with endplate sclerotic changes and mild spinal canal stenosis at L2-L3 level. Stable about 9 mm anterolisthesis L5 on  S1 vertebral body. Significant disc space flattening with vacuum disc phenomenon at L5-S1 level. 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 IMPRESSION: 1. Status post cholecystectomy. 2. There is left nonobstructive nephrolithiasis. No hydronephrosis or hydroureter. No calcified ureteral calculi. 3. No calcified calculi are noted within urinary bladder. 4. There is a low lying cecum. No pericecal inflammation. Status post appendectomy. No small bowel or colonic obstruction. 5. Moderate stool are noted descending colon and proximal sigmoid colon. Colonic diverticula are noted descending colon and sigmoid colon. No evidence of acute colitis or diverticulitis. 6. Stable degenerative changes lumbar spine most significant at L2-L3 and L5-S1 level. Stable anterolisthesis about 1 cm L5 on S1 vertebral body. Electronically Signed   By: Lahoma Crocker M.D.   On: 07/08/2016 14:58     Medications:   . sodium chloride 75 mL/hr at 07/10/16 0859   . amLODipine  5 mg Oral Daily  . aspirin EC  81 mg Oral Daily  . atorvastatin  10 mg Oral Daily  . calcium-vitamin D  1 tablet Oral BID  . gabapentin  300 mg Oral QHS  . heparin  5,000 Units Subcutaneous Q8H  . insulin aspart  0-5 Units Subcutaneous QHS  . insulin aspart  0-9 Units Subcutaneous TID WC  . insulin glargine  12 Units Subcutaneous QHS  .  pantoprazole  40 mg Oral Daily  . PARoxetine  25 mg Oral Daily   acetaminophen **OR** acetaminophen, ALPRAZolam, docusate sodium, meclizine, ondansetron  Assessment/ Plan:  [name] is a 76 y.o. white female with insulin dependent diabetes mellitus type II, hypertension, hyperlipidemia, depression, anxiety, GERD, who was admitted to [facility] on 07/08/2016    1. Acute renal failure on chronic kidney disease stage III: bland urine on urinalysis. Baseline creatinine of 1.4, GFR of 35 02/2016. Chronic kidney disease secondary to diabetic nephropathy.  Acute renal failure with bland urinalysis and  electrolytes at goal. Nonoliguric urine output. No indication for dialysis.  Discussed renal failure with patient and daughter. Suspect this is prerenal azotemia leading to ATN. Less likely due to glomerulonephritis or other intrinsic renal pathology.  - Ultrasound reviewed.  - Pending SPEP/UPEP, ANA, anti-GBM, ANCA  - hold enalapril and hydrochlorothiazide. - Continue IV fluids: NS at 95mL/hr - monitor urine output, volume status and renal function - renally dose all medications.  - Discussed role of biopsy with patient.  2. Hypertension:  Blood pressure at goal. Holding enalapril and hydrochlorothiazide - amlodipine.   3. Diabetes mellitus type II with chronic kidney disease: insulin dependent. Hemoglobin A1c pending.    LOS: 2 Noell Lorensen 3/4/20189:08 AM

## 2016-07-11 LAB — RENAL FUNCTION PANEL
Albumin: 2.9 g/dL — ABNORMAL LOW (ref 3.5–5.0)
Anion gap: 4 — ABNORMAL LOW (ref 5–15)
BUN: 68 mg/dL — ABNORMAL HIGH (ref 6–20)
CO2: 22 mmol/L (ref 22–32)
Calcium: 8 mg/dL — ABNORMAL LOW (ref 8.9–10.3)
Chloride: 114 mmol/L — ABNORMAL HIGH (ref 101–111)
Creatinine, Ser: 6.43 mg/dL — ABNORMAL HIGH (ref 0.44–1.00)
GFR calc Af Amer: 7 mL/min — ABNORMAL LOW (ref >60)
GFR calc non Af Amer: 6 mL/min — ABNORMAL LOW (ref >60)
Glucose, Bld: 179 mg/dL — ABNORMAL HIGH (ref 65–99)
Phosphorus: 3 mg/dL (ref 2.5–4.6)
Potassium: 5 mmol/L (ref 3.5–5.1)
Sodium: 140 mmol/L (ref 135–145)

## 2016-07-11 LAB — MPO/PR-3 (ANCA) ANTIBODIES
ANCA Proteinase 3: <3.5 U/mL (ref 0.0–3.5)
Myeloperoxidase Abs: <9 U/mL (ref 0.0–9.0)

## 2016-07-11 LAB — PROTEIN ELECTROPHORESIS, SERUM
A/G Ratio: 1.1 (ref 0.7–1.7)
Albumin ELP: 2.8 g/dL — ABNORMAL LOW (ref 2.9–4.4)
Alpha-1-Globulin: 0.2 g/dL (ref 0.0–0.4)
Alpha-2-Globulin: 0.9 g/dL (ref 0.4–1.0)
Beta Globulin: 0.8 g/dL (ref 0.7–1.3)
Gamma Globulin: 0.7 g/dL (ref 0.4–1.8)
Globulin, Total: 2.6 g/dL (ref 2.2–3.9)
Total Protein ELP: 5.4 g/dL — ABNORMAL LOW (ref 6.0–8.5)

## 2016-07-11 LAB — PROTEIN / CREATININE RATIO, URINE
Creatinine, Urine: 27 mg/dL
Protein Creatinine Ratio: 0.26 mg/mg{Cre} — ABNORMAL HIGH (ref 0.00–0.15)
Total Protein, Urine: 7 mg/dL

## 2016-07-11 LAB — GLUCOSE, CAPILLARY
Glucose-Capillary: 72 mg/dL (ref 65–99)
Glucose-Capillary: 94 mg/dL (ref 65–99)
Glucose-Capillary: 92 mg/dL (ref 65–99)
Glucose-Capillary: 221 mg/dL — ABNORMAL HIGH (ref 65–99)

## 2016-07-11 LAB — PROTEIN ELECTRO, RANDOM URINE
Albumin ELP, Urine: 22.2 %
Alpha-1-Globulin, U: 7.1 %
Alpha-2-Globulin, U: 19.6 %
Beta Globulin, U: 24.6 %
Gamma Globulin, U: 26.5 %
Total Protein, Urine: 4.2 mg/dL

## 2016-07-11 LAB — GLOMERULAR BASEMENT MEMBRANE ANTIBODIES: GBM Ab: 2 units (ref 0–20)

## 2016-07-11 LAB — ANA W/REFLEX: Anti Nuclear Antibody(ANA): NEGATIVE

## 2016-07-11 MED ORDER — SODIUM CHLORIDE 0.9 % IV SOLN
INTRAVENOUS | Status: DC
  Administered 2016-07-11 – 2016-07-12 (×2): via INTRAVENOUS

## 2016-07-11 MED ORDER — GLUCERNA SHAKE PO LIQD
237.0000 mL | Freq: Two times a day (BID) | ORAL | Status: DC
  Administered 2016-07-11 – 2016-07-12 (×3): 237 mL via ORAL
  Administered 2016-07-13: 15:00:00 via ORAL
  Administered 2016-07-15 – 2016-07-20 (×10): 237 mL via ORAL

## 2016-07-11 NOTE — Care Management Important Message (Signed)
Important Message  Patient Details  Name: Kelly [name] MRN: [id number] Date of Birth: 1941-03-11   Medicare Important Message Given:  Yes    Beverly Sessions, RN 07/11/2016, 11:45 AM

## 2016-07-11 NOTE — Progress Notes (Signed)
[facility]  ROUNDING NOTE   Subjective:   Continued on  If fluids States she is able to void good No SOB No Leg edema  Objective:  Vital signs in last 24 hours:  Temp:  [98.3 F (36.8 C)-98.6 F (37 C)] 98.5 F (36.9 C) (03/05 1420) Pulse Rate:  [73-79] 75 (03/05 1420) Resp:  [16-20] 16 (03/05 1420) BP: (100-142)/(42-70) 100/42 (03/05 1420) SpO2:  [90 %-95 %] 90 % (03/05 1420)  Weight change:  Filed Weights   07/08/16 1247 07/08/16 1252  Weight: 63.5 kg (140 lb) 63.5 kg (140 lb)    Intake/Output: I/O last 3 completed shifts: In: 0865 [P.O.:480; I.V.:1315] Out: 2150 [Urine:2150]   Intake/Output this shift:  Total I/O In: 1213 [P.O.:240; I.V.:973] Out: 1200 [Urine:1200]  Physical Exam: General: NAD, laying in bed  Head: Normocephalic, atraumatic. Moist oral mucosal membranes  Eyes: Anicteric,  Neck: Supple, trachea midline  Lungs:  Clear to auscultation  Heart: Regular rate and rhythm  Abdomen:  Soft, nontender,   Extremities:  no peripheral edema.  Neurologic: Nonfocal, moving all four extremities  Skin: No lesions       Basic Metabolic Panel:  Recent Labs Lab 07/08/16 1254 07/09/16 0312 07/10/16 0314 07/11/16 0333  NA 143 143 144 140  K 5.0 4.4 4.7 5.0  CL 111 113* 116* 114*  CO2 22 22 22 22   GLUCOSE 178* 89 76 179*  BUN 84* 76* 69* 68*  CREATININE 7.89* 7.28* 6.97* 6.43*  CALCIUM 8.9 8.4* 8.5* 8.0*  PHOS  --   --   --  3.0    Liver Function Tests:  Recent Labs Lab 07/09/16 0312 07/11/16 0333  AST 18  --   ALT 16  --   ALKPHOS 58  --   BILITOT 0.7  --   PROT 5.5*  --   ALBUMIN 3.0* 2.9*   No results for input(s): LIPASE, AMYLASE in the last 168 hours. No results for input(s): AMMONIA in the last 168 hours.  CBC:  Recent Labs Lab 07/08/16 1253 07/09/16 0312  WBC 5.9 4.8  NEUTROABS 4.1  --   HGB 11.9* 10.6*  HCT 36.1 31.8*  MCV 91.3 91.7  PLT 156 134*    Cardiac Enzymes: No results for input(s): CKTOTAL,  CKMB, CKMBINDEX, TROPONINI in the last 168 hours.  BNP: Invalid input(s): POCBNP  CBG:  Recent Labs Lab 07/10/16 1718 07/10/16 2117 07/11/16 0743 07/11/16 1129 07/11/16 1637  GLUCAP 97 156* 72 94 92    Microbiology: Results for orders placed or performed during the hospital encounter of 07/08/16  Urine culture     Status: None   Collection Time: 07/08/16 12:54 PM  Result Value Ref Range Status   Specimen Description URINE, CLEAN CATCH  Final   Special Requests NONE  Final   Culture   Final    NO GROWTH Performed at [facility], [place] [address], [place], [place] [address]    Report Status 07/09/2016 FINAL  Final    Coagulation Studies: No results for input(s): LABPROT, INR in the last 72 hours.  Urinalysis: No results for input(s): COLORURINE, LABSPEC, PHURINE, GLUCOSEU, HGBUR, BILIRUBINUR, KETONESUR, PROTEINUR, UROBILINOGEN, NITRITE, LEUKOCYTESUR in the last 72 hours.  Invalid input(s): APPERANCEUR    Imaging: No results found.   Medications:   . sodium chloride 75 mL/hr at 07/11/16 1248   . amLODipine  5 mg Oral Daily  . aspirin EC  81 mg Oral Daily  . atorvastatin  10 mg  Oral Daily  . calcium-vitamin D  1 tablet Oral BID  . dextrose  25 g Intravenous Once  . feeding supplement (GLUCERNA SHAKE)  237 mL Oral BID BM  . gabapentin  300 mg Oral QHS  . heparin  5,000 Units Subcutaneous Q8H  . insulin aspart  0-5 Units Subcutaneous QHS  . insulin aspart  0-9 Units Subcutaneous TID WC  . pantoprazole  40 mg Oral Daily  . PARoxetine  25 mg Oral Daily   acetaminophen **OR** acetaminophen, ALPRAZolam, docusate sodium, meclizine, ondansetron  Assessment/ Plan:  Ms. STEPHANIEMARIE STOFFEL is a 76 y.o. white female with insulin dependent diabetes mellitus type II, hypertension, hyperlipidemia, depression, anxiety, GERD, who was admitted to [facility] on 07/08/2016    1. Acute renal failure on chronic kidney disease stage III: bland urine on urinalysis. Baseline  creatinine of 1.4, GFR of 35 02/2016. Chronic kidney disease secondary to diabetic nephropathy.  Acute renal failure with bland urinalysis  . Nonoliguric urine output. No indication for dialysis.  Discussed renal failure with patient and daughter. Suspect this is prerenal azotemia leading to ATN. Less likely due to glomerulonephritis or other intrinsic renal pathology.  - serum creatinine remains critically elevated although improved slightly - Pending SPEP/UPEP, ANA, anti-GBM, ANCA  - hold enalapril and hydrochlorothiazide. - Continue IV fluids: 1/2 NS at 26mL/hr - monitor urine output, volume status and renal function - renally dose all medications.  - Discussed role of biopsy with patient.- no indication at present. Monitor clinically  2. Hypertension:  Blood pressure control is acceptablel. Holding enalapril and hydrochlorothiazide - amlodipine.   3. Diabetes mellitus type II with chronic kidney disease: insulin dependent. Hemoglobin A1c 7.1%   LOS: 3 Ryliegh Mcduffey 3/5/20185:33 PM

## 2016-07-11 NOTE — Progress Notes (Signed)
[place] at [place] NAME: Kelly [name]    MR#:  [id number]  DATE OF BIRTH:  Jul 28, 1940  SUBJECTIVE: she  denies any complaints, no shortness of breath. .  CHIEF COMPLAINT:   Chief Complaint  Patient presents with  . Abnormal Lab  . Flank Pain    REVIEW OF SYSTEMS:    Review of Systems  Constitutional: Negative for chills and fever.  HENT: Negative for hearing loss.   Eyes: Negative for blurred vision, double vision and photophobia.  Respiratory: Negative for cough, hemoptysis and shortness of breath.   Cardiovascular: Negative for palpitations, orthopnea and leg swelling.  Gastrointestinal: Negative for abdominal pain, diarrhea and vomiting.  Genitourinary: Negative for dysuria and urgency.  Musculoskeletal: Negative for myalgias and neck pain.  Skin: Negative for rash.  Neurological: Negative for dizziness, focal weakness, seizures, weakness and headaches.  Psychiatric/Behavioral: Negative for memory loss. The patient does not have insomnia.     Nutrition:  Tolerating Diet: Tolerating PT:      DRUG ALLERGIES:   Allergies  Allergen Reactions  . Amoxicillin   . Morphine And Related     VITALS:  Blood pressure (!) 142/70, pulse 79, temperature 98.6 F (37 C), temperature source Oral, resp. rate 20, height 5\' 4"  (1.626 m), weight 63.5 kg (140 lb), SpO2 95 %.  PHYSICAL EXAMINATION:   Physical Exam  GENERAL:  76 y.o.-year-old patient lying in the bed with no acute distress.  EYES: Pupils equal, round, reactive to light and accommodation. No scleral icterus. Extraocular muscles intact.  HEENT: Head atraumatic, normocephalic. Oropharynx and nasopharynx clear.  NECK:  Supple, no jugular venous distention. No thyroid enlargement, no tenderness.  LUNGS: Normal breath sounds bilaterally, no wheezing, rales,rhonchi or crepitation. No use of accessory muscles of respiration.  CARDIOVASCULAR: S1, S2 normal. No  murmurs, rubs, or gallops.  ABDOMEN: Soft, nontender, nondistended. Bowel sounds present. No organomegaly or mass.  EXTREMITIES: No pedal edema, cyanosis, or clubbing.  NEUROLOGIC: Cranial nerves II through XII are intact. Muscle strength 5/5 in all extremities. Sensation intact. Gait not checked.  PSYCHIATRIC: The patient is alert and oriented x 3.  SKIN: No obvious rash, lesion, or ulcer.    LABORATORY PANEL:   CBC  Recent Labs Lab 07/09/16 0312  WBC 4.8  HGB 10.6*  HCT 31.8*  PLT 134*   ------------------------------------------------------------------------------------------------------------------  Chemistries   Recent Labs Lab 07/09/16 0312  07/11/16 0333  NA 143  < > 140  K 4.4  < > 5.0  CL 113*  < > 114*  CO2 22  < > 22  GLUCOSE 89  < > 179*  BUN 76*  < > 68*  CREATININE 7.28*  < > 6.43*  CALCIUM 8.4*  < > 8.0*  AST 18  --   --   ALT 16  --   --   ALKPHOS 58  --   --   BILITOT 0.7  --   --   < > = values in this interval not displayed. ------------------------------------------------------------------------------------------------------------------  Cardiac Enzymes No results for input(s): TROPONINI in the last 168 hours. ------------------------------------------------------------------------------------------------------------------  RADIOLOGY:  No results found.   ASSESSMENT AND PLAN:   Active Problems:   AKI (acute kidney injury) ([place])  1. acute right flank pain with acute renal failure likely due to ATN: CT of the kidneys did not show any hydronephrosis. Ultrasound of kidneys showed no hydro.medical renal disease,. Continue IV hydration and monitor kidney function closely, pending  labs SPEP, UPEP, ANCA, anti-GBM, ANCA. Avoid diuretics, lisinopril. Acute renal failure on chronic kidney disease stage III: . 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 Spoke with  nephrology, as long as patient creatinine is trending down without evidence of fluid overload or hyperkalemia continue conservative management management for next few days and see how she does. Avoid diuretics, nephrotoxic agents. Discussed this with patient's daughter and also the patient.    #2 diabetes mellitus type 2: Hypoglycemia today.hold  Lantus as for diabetes coordinator recommendation. Continue suicide with coverage. decrease lev emir,stopped glipizide  , #3 essential hypertension: Controlled #4 GERD: Continue PPI but the sucralfate is stopped D/w daughter All the records are reviewed and case discussed with Care Management/Social Workerr. Management plans discussed with the patient, family and they are in agreement.  CODE STATUS:full  TOTAL TIME TAKING CARE OF THIS PATIENT: 35 minutes.   POSSIBLE D/C IN 1-2 DAYS, DEPENDING ON CLINICAL CONDITION.   Epifanio Lesches M.D on 07/11/2016 at 11:43 AM  Between 7am to 6pm - Pager - [phone]  After 6pm go to [email or web address] - password [email or web address] [facility]  Office  [phone]  CC: Primary care physician; Albina Billet, MD

## 2016-07-11 NOTE — Progress Notes (Signed)
Inpatient Diabetes Program Recommendations  AACE/ADA: New Consensus Statement on Inpatient Glycemic Control (2015)  Target Ranges:  Prepandial:   less than 140 mg/dL      Peak postprandial:   less than 180 mg/dL (1-2 hours)      Critically ill patients:  140 - 180 mg/dL   Lab Results  Component Value Date   GLUCAP 72 07/11/2016   HGBA1C 7.1 (H) 07/09/2016    Review of Glycemic ControlResults for CANISHA, ISSAC (MRN [id number]) as of 07/11/2016 10:47  Ref. Range 07/10/2016 07:34 07/10/2016 08:00 07/10/2016 11:28 07/10/2016 16:37 07/10/2016 16:52 07/10/2016 17:18 07/10/2016 21:17 07/11/2016 07:43  Glucose-Capillary Latest Ref Range: 65 - 99 mg/dL 43 (LL) 139 (H) 265 (H) 41 (LL) 47 (L) 97 156 (H) 72   Diabetes history: Type 2 diabetes Outpatient Diabetes medications: Basaglar 16 units daily, Glucotrol XL 10 mg bid Current orders for Inpatient glycemic control:  Lantus 12 units q HS, Novolog sensitive tid with meals and HS  Inpatient Diabetes Program Recommendations:    Note that Lantus held last PM due to low blood sugars and MD order.  Fasting CBG=72 mg/dL.  Consider holding Lantus until fasting blood glucose>120 mg/dL.   Thanks, Adah Perl, RN, BC-ADM Inpatient Diabetes Coordinator Pager [phone] (8a-5p)

## 2016-07-12 LAB — BASIC METABOLIC PANEL
Anion gap: 6 (ref 5–15)
Anion gap: 6 (ref 5–15)
BUN: 60 mg/dL — ABNORMAL HIGH (ref 6–20)
BUN: 57 mg/dL — ABNORMAL HIGH (ref 6–20)
CO2: 23 mmol/L (ref 22–32)
CO2: 23 mmol/L (ref 22–32)
Calcium: 8.4 mg/dL — ABNORMAL LOW (ref 8.9–10.3)
Calcium: 7.9 mg/dL — ABNORMAL LOW (ref 8.9–10.3)
Chloride: 115 mmol/L — ABNORMAL HIGH (ref 101–111)
Chloride: 113 mmol/L — ABNORMAL HIGH (ref 101–111)
Creatinine, Ser: 6.08 mg/dL — ABNORMAL HIGH (ref 0.44–1.00)
Creatinine, Ser: 5.99 mg/dL — ABNORMAL HIGH (ref 0.44–1.00)
GFR calc Af Amer: 7 mL/min — ABNORMAL LOW (ref >60)
GFR calc Af Amer: 7 mL/min — ABNORMAL LOW (ref >60)
GFR calc non Af Amer: 6 mL/min — ABNORMAL LOW (ref >60)
GFR calc non Af Amer: 6 mL/min — ABNORMAL LOW (ref >60)
Glucose, Bld: 87 mg/dL (ref 65–99)
Glucose, Bld: 145 mg/dL — ABNORMAL HIGH (ref 65–99)
Potassium: 4.8 mmol/L (ref 3.5–5.1)
Potassium: 4.9 mmol/L (ref 3.5–5.1)
Sodium: 144 mmol/L (ref 135–145)
Sodium: 142 mmol/L (ref 135–145)

## 2016-07-12 LAB — GLUCOSE, CAPILLARY
Glucose-Capillary: 129 mg/dL — ABNORMAL HIGH (ref 65–99)
Glucose-Capillary: 63 mg/dL — ABNORMAL LOW (ref 65–99)
Glucose-Capillary: 154 mg/dL — ABNORMAL HIGH (ref 65–99)
Glucose-Capillary: 144 mg/dL — ABNORMAL HIGH (ref 65–99)
Glucose-Capillary: 164 mg/dL — ABNORMAL HIGH (ref 65–99)

## 2016-07-12 LAB — MPO/PR-3 (ANCA) ANTIBODIES
ANCA Proteinase 3: <3.5 U/mL (ref 0.0–3.5)
Myeloperoxidase Abs: <9 U/mL (ref 0.0–9.0)

## 2016-07-12 MED ORDER — SODIUM CHLORIDE 0.45 % IV SOLN
INTRAVENOUS | Status: DC
  Administered 2016-07-12 – 2016-07-15 (×6): via INTRAVENOUS

## 2016-07-12 NOTE — Progress Notes (Signed)
[place] at [place] NAME: Kelly [name]    MR#:  [id number]  DATE OF BIRTH:  1940-08-30  SUBJECTIVE: she  denies any complaints, no shortness of breath. .CR down to 6.  CHIEF COMPLAINT:   Chief Complaint  Patient presents with  . Abnormal Lab  . Flank Pain    REVIEW OF SYSTEMS:    Review of Systems  Constitutional: Negative for chills and fever.  HENT: Negative for hearing loss.   Eyes: Negative for blurred vision, double vision and photophobia.  Respiratory: Negative for cough, hemoptysis and shortness of breath.   Cardiovascular: Negative for palpitations, orthopnea and leg swelling.  Gastrointestinal: Negative for abdominal pain, diarrhea and vomiting.  Genitourinary: Negative for dysuria and urgency.  Musculoskeletal: Negative for myalgias and neck pain.  Skin: Negative for rash.  Neurological: Negative for dizziness, focal weakness, seizures, weakness and headaches.  Psychiatric/Behavioral: Negative for memory loss. The patient does not have insomnia.     Nutrition:  Tolerating Diet: Tolerating PT:      DRUG ALLERGIES:   Allergies  Allergen Reactions  . Amoxicillin   . Morphine And Related     VITALS:  Blood pressure (!) 141/71, pulse 76, temperature 97.9 F (36.6 C), temperature source Oral, resp. rate 18, height 5\' 4"  (1.626 m), weight 63.5 kg (140 lb), SpO2 97 %.  PHYSICAL EXAMINATION:   Physical Exam  GENERAL:  76 y.o.-year-old patient lying in the bed with no acute distress.  EYES: Pupils equal, round, reactive to light and accommodation. No scleral icterus. Extraocular muscles intact.  HEENT: Head atraumatic, normocephalic. Oropharynx and nasopharynx clear.  NECK:  Supple, no jugular venous distention. No thyroid enlargement, no tenderness.  LUNGS: Normal breath sounds bilaterally, no wheezing, rales,rhonchi or crepitation. No use of accessory muscles of respiration.  CARDIOVASCULAR: S1, S2  normal. No murmurs, rubs, or gallops.  ABDOMEN: Soft, nontender, nondistended. Bowel sounds present. No organomegaly or mass.  EXTREMITIES: No pedal edema, cyanosis, or clubbing.  NEUROLOGIC: Cranial nerves II through XII are intact. Muscle strength 5/5 in all extremities. Sensation intact. Gait not checked.  PSYCHIATRIC: The patient is alert and oriented x 3.  SKIN: No obvious rash, lesion, or ulcer.    LABORATORY PANEL:   CBC  Recent Labs Lab 07/09/16 0312  WBC 4.8  HGB 10.6*  HCT 31.8*  PLT 134*   ------------------------------------------------------------------------------------------------------------------  Chemistries   Recent Labs Lab 07/09/16 0312  07/12/16 0434  NA 143  < > 144  K 4.4  < > 4.8  CL 113*  < > 115*  CO2 22  < > 23  GLUCOSE 89  < > 87  BUN 76*  < > 60*  CREATININE 7.28*  < > 6.08*  CALCIUM 8.4*  < > 8.4*  AST 18  --   --   ALT 16  --   --   ALKPHOS 58  --   --   BILITOT 0.7  --   --   < > = values in this interval not displayed. ------------------------------------------------------------------------------------------------------------------  Cardiac Enzymes No results for input(s): TROPONINI in the last 168 hours. ------------------------------------------------------------------------------------------------------------------  RADIOLOGY:  No results found.   ASSESSMENT AND PLAN:   Active Problems:   AKI (acute kidney injury) ([place])  1. acute right flank pain with acute renal failure likely due to ATN: CT of the kidneys did not show any hydronephrosis. Ultrasound of kidneys showed no hydro.medical renal disease,. Continue IV hydration and monitor kidney  function closely,  Avoid diuretics, lisinopril..kidney function improving.immune glomerulonephritis  Work up negative. Acute renal failure on chronic kidney disease stage III: . 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 Spoke with nephrology, as long as patient creatinine is trending down without evidence of fluid overload or hyperkalemia continue conservative management management for next few days and see how she does. Avoid diuretics, nephrotoxic agents. Discussed this with patient's daughter and also the patient.    #2 diabetes mellitus type 2: Hypoglycemia today.hold  Lantus as for diabetes coordinator recommendation.hold victoza,once hypoglycemia gets better,resume lantus at lower dose, , #3 essential hypertension: Controlled #4 GERD: Continue PPI but the sucralfate is stopped  5.diarrhoea;llikley orotic.if persists check  Stool  For infection. D/w daughter D/w daughter All the records are reviewed and case discussed with Care Management/Social Workerr. Management plans discussed with the patient, family and they are in agreement.  CODE STATUS:full  TOTAL TIME TAKING CARE OF THIS PATIENT: 35 minutes.   POSSIBLE D/C IN 1-2 DAYS, DEPENDING ON CLINICAL CONDITION.   Epifanio Lesches M.D on 07/12/2016 at 4:55 PM  Between 7am to 6pm - Pager - [phone]  After 6pm go to [email or web address] - password [email or web address] [facility]  Office  [phone]  CC: Primary care physician; Albina Billet, MD

## 2016-07-12 NOTE — Progress Notes (Signed)
[facility]  ROUNDING NOTE   Subjective:   Continued on IV fluids States she is able to void good No SOB No Leg edema UOP > 3600 cc S Cr further improved slightly to 6.08  Objective:  Vital signs in last 24 hours:  Temp:  [97.9 F (36.6 C)-98.4 F (36.9 C)] 97.9 F (36.6 C) (03/06 1211) Pulse Rate:  [76-80] 76 (03/06 1211) Resp:  [16-18] 18 (03/06 1211) BP: (128-141)/(62-71) 141/71 (03/06 1211) SpO2:  [94 %-97 %] 97 % (03/06 1211)  Weight change:  Filed Weights   07/08/16 1247 07/08/16 1252  Weight: 63.5 kg (140 lb) 63.5 kg (140 lb)    Intake/Output: I/O last 3 completed shifts: In: 3177 [P.O.:480; I.V.:2697] Out: 3850 [Urine:3850]   Intake/Output this shift:  Total I/O In: 120 [P.O.:120] Out: 1350 [Urine:1350]  Physical Exam: General: NAD, laying in bed  Head: Normocephalic, atraumatic. Moist oral mucosal membranes  Eyes: Anicteric,  Neck: Supple, trachea midline  Lungs:  Clear to auscultation  Heart: Regular rate and rhythm  Abdomen:  Soft, nontender,   Extremities:  no peripheral edema.  Neurologic: Nonfocal, moving all four extremities  Skin: No lesions       Basic Metabolic Panel:  Recent Labs Lab 07/08/16 1254 07/09/16 0312 07/10/16 0314 07/11/16 0333 07/12/16 0434  NA 143 143 144 140 144  K 5.0 4.4 4.7 5.0 4.8  CL 111 113* 116* 114* 115*  CO2 22 22 22 22 23   GLUCOSE 178* 89 76 179* 87  BUN 84* 76* 69* 68* 60*  CREATININE 7.89* 7.28* 6.97* 6.43* 6.08*  CALCIUM 8.9 8.4* 8.5* 8.0* 8.4*  PHOS  --   --   --  3.0  --     Liver Function Tests:  Recent Labs Lab 07/09/16 0312 07/11/16 0333  AST 18  --   ALT 16  --   ALKPHOS 58  --   BILITOT 0.7  --   PROT 5.5*  --   ALBUMIN 3.0* 2.9*   No results for input(s): LIPASE, AMYLASE in the last 168 hours. No results for input(s): AMMONIA in the last 168 hours.  CBC:  Recent Labs Lab 07/08/16 1253 07/09/16 0312  WBC 5.9 4.8  NEUTROABS 4.1  --   HGB 11.9* 10.6*  HCT  36.1 31.8*  MCV 91.3 91.7  PLT 156 134*    Cardiac Enzymes: No results for input(s): CKTOTAL, CKMB, CKMBINDEX, TROPONINI in the last 168 hours.  BNP: Invalid input(s): POCBNP  CBG:  Recent Labs Lab 07/11/16 1637 07/11/16 2140 07/12/16 0744 07/12/16 0818 07/12/16 1123  GLUCAP 92 221* 68* 129* 154*    Microbiology: Results for orders placed or performed during the hospital encounter of 07/08/16  Urine culture     Status: None   Collection Time: 07/08/16 12:54 PM  Result Value Ref Range Status   Specimen Description URINE, CLEAN CATCH  Final   Special Requests NONE  Final   Culture   Final    NO GROWTH Performed at [facility], [place] [address] [place], [place] [address]    Report Status 07/09/2016 FINAL  Final    Coagulation Studies: No results for input(s): LABPROT, INR in the last 72 hours.  Urinalysis: No results for input(s): COLORURINE, LABSPEC, PHURINE, GLUCOSEU, HGBUR, BILIRUBINUR, KETONESUR, PROTEINUR, UROBILINOGEN, NITRITE, LEUKOCYTESUR in the last 72 hours.  Invalid input(s): APPERANCEUR    Imaging: No results found.   Medications:   . sodium chloride 75 mL/hr at 07/12/16 1414   .  amLODipine  5 mg Oral Daily  . aspirin EC  81 mg Oral Daily  . atorvastatin  10 mg Oral Daily  . calcium-vitamin D  1 tablet Oral BID  . dextrose  25 g Intravenous Once  . feeding supplement (GLUCERNA SHAKE)  237 mL Oral BID BM  . gabapentin  300 mg Oral QHS  . heparin  5,000 Units Subcutaneous Q8H  . insulin aspart  0-5 Units Subcutaneous QHS  . insulin aspart  0-9 Units Subcutaneous TID WC  . pantoprazole  40 mg Oral Daily  . PARoxetine  25 mg Oral Daily   acetaminophen **OR** acetaminophen, ALPRAZolam, meclizine, ondansetron  Assessment/ Plan:  Kelly [name] is a 76 y.o. white female with insulin dependent diabetes mellitus type II, hypertension, hyperlipidemia, depression, anxiety, GERD, who was admitted to [facility] on 07/08/2016    1. Acute  renal failure on chronic kidney disease stage III: bland urine on urinalysis. Baseline creatinine of 1.4, GFR of 35 02/2016. Chronic kidney disease secondary to diabetic nephropathy.   . Discussed renal failure with patient and daughter. Suspect this is prerenal azotemia leading to ATN. Less likely due to glomerulonephritis or other intrinsic renal pathology.  - serum creatinine remains critically elevated although improved slightly - SPEP/UPEP, ANA, anti-GBM, ANCA are negative - hold enalapril and hydrochlorothiazide. - Continue IV fluids: 1/2 NS at 71mL/hr - monitor urine output, volume status and renal function; UOP 3600 cc - renally dose all medications.  - Discussed role of biopsy with patient.- no indication at present. Monitor clinically  2. Hypertension:  Blood pressure control is acceptable. Holding enalapril and hydrochlorothiazide - amlodipine.   3. Diabetes mellitus type II with chronic kidney disease: insulin dependent. Hemoglobin A1c 7.1%   LOS: 4 Kelly [name] 3/6/20183:44 PM

## 2016-07-13 LAB — CBC
HCT: 33.5 % — ABNORMAL LOW (ref 35.0–47.0)
Hemoglobin: 11.3 g/dL — ABNORMAL LOW (ref 12.0–16.0)
MCH: 30.4 pg (ref 26.0–34.0)
MCHC: 33.7 g/dL (ref 32.0–36.0)
MCV: 90.2 fL (ref 80.0–100.0)
Platelets: 121 10*3/uL — ABNORMAL LOW (ref 150–440)
RBC: 3.72 MIL/uL — ABNORMAL LOW (ref 3.80–5.20)
RDW: 13.9 % (ref 11.5–14.5)
WBC: 6 10*3/uL (ref 3.6–11.0)

## 2016-07-13 LAB — BASIC METABOLIC PANEL
Anion gap: 7 (ref 5–15)
BUN: 58 mg/dL — ABNORMAL HIGH (ref 6–20)
CO2: 24 mmol/L (ref 22–32)
Calcium: 8.7 mg/dL — ABNORMAL LOW (ref 8.9–10.3)
Chloride: 111 mmol/L (ref 101–111)
Creatinine, Ser: 5.66 mg/dL — ABNORMAL HIGH (ref 0.44–1.00)
GFR calc Af Amer: 8 mL/min — ABNORMAL LOW (ref >60)
GFR calc non Af Amer: 7 mL/min — ABNORMAL LOW (ref >60)
Glucose, Bld: 87 mg/dL (ref 65–99)
Potassium: 4.9 mmol/L (ref 3.5–5.1)
Sodium: 142 mmol/L (ref 135–145)

## 2016-07-13 LAB — GLUCOSE, CAPILLARY
Glucose-Capillary: 102 mg/dL — ABNORMAL HIGH (ref 65–99)
Glucose-Capillary: 120 mg/dL — ABNORMAL HIGH (ref 65–99)
Glucose-Capillary: 129 mg/dL — ABNORMAL HIGH (ref 65–99)
Glucose-Capillary: 143 mg/dL — ABNORMAL HIGH (ref 65–99)

## 2016-07-13 NOTE — Progress Notes (Addendum)
[place] at [place] NAME: Kelly [name]    MR#:  [id number]  DATE OF BIRTH:  [birth year]  SUBJECTIVE: she  denies any complaints, no shortness of breath. .CR down to 5.6.out put;2;6 litres/24hrs.no sob or edema.  CHIEF COMPLAINT:   Chief Complaint  Patient presents with  . Abnormal Lab  . Flank Pain    REVIEW OF SYSTEMS:    Review of Systems  Constitutional: Negative for chills and fever.  HENT: Negative for hearing loss.   Eyes: Negative for blurred vision, double vision and photophobia.  Respiratory: Negative for cough, hemoptysis and shortness of breath.   Cardiovascular: Negative for palpitations, orthopnea and leg swelling.  Gastrointestinal: Negative for abdominal pain, diarrhea and vomiting.  Genitourinary: Negative for dysuria and urgency.  Musculoskeletal: Negative for myalgias and neck pain.  Skin: Negative for rash.  Neurological: Negative for dizziness, focal weakness, seizures, weakness and headaches.  Psychiatric/Behavioral: Negative for memory loss. The patient does not have insomnia.     Nutrition:  Tolerating Diet: Tolerating PT:      DRUG ALLERGIES:   Allergies  Allergen Reactions  . Amoxicillin   . Morphine And Related     VITALS:  Blood pressure (!) 142/64, pulse 80, temperature 98 F (36.7 C), temperature source Oral, resp. rate 18, height 5\' 4"  (1.626 m), weight 63.5 kg (140 lb), SpO2 91 %.  PHYSICAL EXAMINATION:   Physical Exam  GENERAL:  76 y.o.-year-old patient lying in the bed with no acute distress.  EYES: Pupils equal, round, reactive to light and accommodation. No scleral icterus. Extraocular muscles intact.  HEENT: Head atraumatic, normocephalic. Oropharynx and nasopharynx clear.  NECK:  Supple, no jugular venous distention. No thyroid enlargement, no tenderness.  LUNGS: Normal breath sounds bilaterally, no wheezing, rales,rhonchi or crepitation. No use of accessory muscles  of respiration.  CARDIOVASCULAR: S1, S2 normal. No murmurs, rubs, or gallops.  ABDOMEN: Soft, nontender, nondistended. Bowel sounds present. No organomegaly or mass.  EXTREMITIES: No pedal edema, cyanosis, or clubbing.  NEUROLOGIC: Cranial nerves II through XII are intact. Muscle strength 5/5 in all extremities. Sensation intact. Gait not checked.  PSYCHIATRIC: The patient is alert and oriented x 3.  SKIN: No obvious rash, lesion, or ulcer.    LABORATORY PANEL:   CBC  Recent Labs Lab 07/13/16 1223  WBC 6.0  HGB 11.3*  HCT 33.5*  PLT 121*   ------------------------------------------------------------------------------------------------------------------  Chemistries   Recent Labs Lab 07/09/16 0312  07/13/16 1223  NA 143  < > 142  K 4.4  < > 4.9  CL 113*  < > 111  CO2 22  < > 24  GLUCOSE 89  < > 87  BUN 76*  < > 58*  CREATININE 7.28*  < > 5.66*  CALCIUM 8.4*  < > 8.7*  AST 18  --   --   ALT 16  --   --   ALKPHOS 58  --   --   BILITOT 0.7  --   --   < > = values in this interval not displayed. ------------------------------------------------------------------------------------------------------------------  Cardiac Enzymes No results for input(s): TROPONINI in the last 168 hours. ------------------------------------------------------------------------------------------------------------------  RADIOLOGY:  No results found.   ASSESSMENT AND PLAN:   Active Problems:   AKI (acute kidney injury) ([place])  1. acute right flank pain with acute renal failure likely due to ATN: CT of the kidneys did not show any hydronephrosis. Ultrasound of kidneys showed no hydro.medical renal disease,. 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 Acute renal failure on chronic kidney disease stage III: . Baseline creatinine of 1.4, GFR of 35 02/2016.CKD  secondary to  diabetic nephropathy, Continue IV hydration, creatinine down to 5.6. Spoke with nephrology, as long as patient creatinine is trending down without evidence of fluid overload or hyperkalemia continue conservative management management for next few days and see how she does. Avoid diuretics, nephrotoxic agents. Discussed this with patient's daughter and also the patient. Monitor UOP,continue gentle hydration with 1/4 ns at 75 cc/hr.   #2 diabetes mellitus type 2: Hypoglycemia today.hold  Lantus as for diabetes coordinator recommendation.hold victoza,once hypoglycemia gets better,resume lantus at lower dose, , #3 essential hypertension: Controlled #4 GERD: Continue PPI but the sucralfate is stopped  5.diarrhoearesolved. D/w daughter All the records are reviewed and case discussed with Care Management/Social Workerr. Management plans discussed with the patient, family and they are in agreement.  CODE STATUS:full  TOTAL TIME TAKING CARE OF THIS PATIENT: 35 minutes.   POSSIBLE D/C IN 1-2 DAYS, DEPENDING ON CLINICAL CONDITION.   Epifanio Lesches M.D on 07/13/2016 at 6:25 PM  Between 7am to 6pm - Pager - [phone]  After 6pm go to [email or web address] - password [email or web address] [facility]  Office  [phone]  CC: Primary care physician; Albina Billet, MD

## 2016-07-13 NOTE — Progress Notes (Signed)
[facility]  ROUNDING NOTE   Subjective:   Continued on IV fluids States she is able to void good No SOB No Leg edema UOP > 2500 cc S Cr further improved slightly to 5.66. Patient is sad today because her sister passed away  Objective:  Vital signs in last 24 hours:  Temp:  [98 F (36.7 C)-98.7 F (37.1 C)] 98 F (36.7 C) (03/07 1211) Pulse Rate:  [74-80] 80 (03/07 1211) Resp:  [14-18] 18 (03/07 1211) BP: (118-142)/(60-70) 142/64 (03/07 1211) SpO2:  [91 %-99 %] 91 % (03/07 1211)  Weight change:  Filed Weights   07/08/16 1247 07/08/16 1252  Weight: 63.5 kg (140 lb) 63.5 kg (140 lb)    Intake/Output: I/O last 3 completed shifts: In: 3055 [P.O.:760; I.V.:2295] Out: 4500 [Urine:4500]   Intake/Output this shift:  Total I/O In: 860 [P.O.:240; I.V.:620] Out: 1050 [Urine:1050]  Physical Exam: General: NAD, laying in bed  Head: Normocephalic, atraumatic. Moist oral mucosal membranes  Eyes: Anicteric,  Neck: Supple, trachea midline  Lungs:  Clear to auscultation  Heart: Regular rate and rhythm  Abdomen:  Soft, nontender,   Extremities:  no peripheral edema.  Neurologic: Nonfocal, moving all four extremities  Skin: No lesions       Basic Metabolic Panel:  Recent Labs Lab 07/10/16 0314 07/11/16 0333 07/12/16 0434 07/12/16 1634 07/13/16 1223  NA 144 140 144 142 142  K 4.7 5.0 4.8 4.9 4.9  CL 116* 114* 115* 113* 111  CO2 22 22 23 23 24   GLUCOSE 76 179* 87 145* 87  BUN 69* 68* 60* 57* 58*  CREATININE 6.97* 6.43* 6.08* 5.99* 5.66*  CALCIUM 8.5* 8.0* 8.4* 7.9* 8.7*  PHOS  --  3.0  --   --   --     Liver Function Tests:  Recent Labs Lab 07/09/16 0312 07/11/16 0333  AST 18  --   ALT 16  --   ALKPHOS 58  --   BILITOT 0.7  --   PROT 5.5*  --   ALBUMIN 3.0* 2.9*   No results for input(s): LIPASE, AMYLASE in the last 168 hours. No results for input(s): AMMONIA in the last 168 hours.  CBC:  Recent Labs Lab 07/08/16 1253  07/09/16 0312 07/13/16 1223  WBC 5.9 4.8 6.0  NEUTROABS 4.1  --   --   HGB 11.9* 10.6* 11.3*  HCT 36.1 31.8* 33.5*  MCV 91.3 91.7 90.2  PLT 156 134* 121*    Cardiac Enzymes: No results for input(s): CKTOTAL, CKMB, CKMBINDEX, TROPONINI in the last 168 hours.  BNP: Invalid input(s): POCBNP  CBG:  Recent Labs Lab 07/12/16 1123 07/12/16 1639 07/12/16 2110 07/13/16 0742 07/13/16 1111  GLUCAP 154* 144* 164* 102* 120*    Microbiology: Results for orders placed or performed during the hospital encounter of 07/08/16  Urine culture     Status: None   Collection Time: 07/08/16 12:54 PM  Result Value Ref Range Status   Specimen Description URINE, CLEAN CATCH  Final   Special Requests NONE  Final   Culture   Final    NO GROWTH Performed at [facility], [place] [address], [place], [place] [address]    Report Status 07/09/2016 FINAL  Final    Coagulation Studies: No results for input(s): LABPROT, INR in the last 72 hours.  Urinalysis: No results for input(s): COLORURINE, LABSPEC, PHURINE, GLUCOSEU, HGBUR, BILIRUBINUR, KETONESUR, PROTEINUR, UROBILINOGEN, NITRITE, LEUKOCYTESUR in the last 72 hours.  Invalid input(s): APPERANCEUR  Imaging: No results found.   Medications:   . sodium chloride 75 mL/hr at 07/13/16 0430   . amLODipine  5 mg Oral Daily  . aspirin EC  81 mg Oral Daily  . atorvastatin  10 mg Oral Daily  . calcium-vitamin D  1 tablet Oral BID  . dextrose  25 g Intravenous Once  . feeding supplement (GLUCERNA SHAKE)  237 mL Oral BID BM  . gabapentin  300 mg Oral QHS  . heparin  5,000 Units Subcutaneous Q8H  . insulin aspart  0-5 Units Subcutaneous QHS  . insulin aspart  0-9 Units Subcutaneous TID WC  . pantoprazole  40 mg Oral Daily  . PARoxetine  25 mg Oral Daily   acetaminophen **OR** acetaminophen, ALPRAZolam, meclizine, ondansetron  Assessment/ Plan:  [name] is a 77 y.o. white female with insulin dependent diabetes  mellitus type II, hypertension, hyperlipidemia, depression, anxiety, GERD, who was admitted to [facility] on 07/08/2016    1. Acute renal failure on chronic kidney disease stage III: bland urine on urinalysis. Baseline creatinine of 1.4, GFR of 35 02/2016. Chronic kidney disease secondary to diabetic nephropathy.   . Discussed renal failure with patient and daughter. Suspect this is prerenal azotemia leading to ATN. Less likely due to glomerulonephritis or other intrinsic renal pathology.  - serum creatinine remains critically elevated although improving - SPEP/UPEP, ANA, anti-GBM, ANCA are negative - hold enalapril and hydrochlorothiazide. - Continue IV fluids: 1/2 NS at 1mL/hr - monitor urine output, volume status and renal function; UOP 2500 cc - renally dose all medications.  - Discussed role of biopsy with patient.- no indication at present. Monitor clinically - If s Creatinine continues to improve may be able to discharge tomorrow  2. Hypertension:  Blood pressure control is acceptable. Holding enalapril and hydrochlorothiazide - amlodipine.   3. Diabetes mellitus type II with chronic kidney disease: insulin dependent. Hemoglobin A1c 7.1%   LOS: 5 Kelly [name] 3/7/20181:22 PM

## 2016-07-14 ENCOUNTER — Inpatient Hospital Stay: Payer: Medicare Other

## 2016-07-14 LAB — BASIC METABOLIC PANEL
Anion gap: 7 (ref 5–15)
BUN: 61 mg/dL — ABNORMAL HIGH (ref 6–20)
CO2: 23 mmol/L (ref 22–32)
Calcium: 8.4 mg/dL — ABNORMAL LOW (ref 8.9–10.3)
Chloride: 108 mmol/L (ref 101–111)
Creatinine, Ser: 5.86 mg/dL — ABNORMAL HIGH (ref 0.44–1.00)
GFR calc Af Amer: 7 mL/min — ABNORMAL LOW (ref >60)
GFR calc non Af Amer: 6 mL/min — ABNORMAL LOW (ref >60)
Glucose, Bld: 184 mg/dL — ABNORMAL HIGH (ref 65–99)
Potassium: 5 mmol/L (ref 3.5–5.1)
Sodium: 138 mmol/L (ref 135–145)

## 2016-07-14 LAB — RENAL FUNCTION PANEL
Albumin: 3.1 g/dL — ABNORMAL LOW (ref 3.5–5.0)
Anion gap: 6 (ref 5–15)
BUN: 62 mg/dL — ABNORMAL HIGH (ref 6–20)
CO2: 21 mmol/L — ABNORMAL LOW (ref 22–32)
Calcium: 8.3 mg/dL — ABNORMAL LOW (ref 8.9–10.3)
Chloride: 109 mmol/L (ref 101–111)
Creatinine, Ser: 5.79 mg/dL — ABNORMAL HIGH (ref 0.44–1.00)
GFR calc Af Amer: 7 mL/min — ABNORMAL LOW (ref >60)
GFR calc non Af Amer: 6 mL/min — ABNORMAL LOW (ref >60)
Glucose, Bld: 130 mg/dL — ABNORMAL HIGH (ref 65–99)
Phosphorus: 3.2 mg/dL (ref 2.5–4.6)
Potassium: 6.3 mmol/L (ref 3.5–5.1)
Sodium: 136 mmol/L (ref 135–145)

## 2016-07-14 LAB — GLUCOSE, CAPILLARY
Glucose-Capillary: 107 mg/dL — ABNORMAL HIGH (ref 65–99)
Glucose-Capillary: 117 mg/dL — ABNORMAL HIGH (ref 65–99)
Glucose-Capillary: 89 mg/dL (ref 65–99)
Glucose-Capillary: 163 mg/dL — ABNORMAL HIGH (ref 65–99)

## 2016-07-14 LAB — CREATININE, URINE, RANDOM: Creatinine, Urine: 38 mg/dL

## 2016-07-14 MED ORDER — LABETALOL HCL 5 MG/ML IV SOLN: 10.0000 mg | INTRAVENOUS | Status: DC | PRN

## 2016-07-14 MED ORDER — DEXTROSE 50 % IV SOLN
25.0000 mL | Freq: Once | INTRAVENOUS | Status: AC
  Administered 2016-07-14: 25 mL via INTRAVENOUS

## 2016-07-14 MED ORDER — INSULIN ASPART 100 UNIT/ML IV SOLN
5.0000 [IU] | Freq: Once | INTRAVENOUS | Status: AC
  Administered 2016-07-14: 5 [IU] via INTRAVENOUS
  Filled 2016-07-14: qty 0.05

## 2016-07-14 MED ORDER — SODIUM CHLORIDE 0.9 % IV SOLN
1.0000 g | Freq: Once | INTRAVENOUS | Status: AC
  Administered 2016-07-14: 1 g via INTRAVENOUS
  Filled 2016-07-14: qty 10

## 2016-07-14 MED ORDER — LORAZEPAM 0.5 MG PO TABS
0.5000 mg | ORAL_TABLET | Freq: Once | ORAL | Status: AC
  Administered 2016-07-14: 0.5 mg via ORAL
  Filled 2016-07-14: qty 1

## 2016-07-14 MED ORDER — SODIUM POLYSTYRENE SULFONATE 15 GM/60ML PO SUSP
30.0000 g | Freq: Once | ORAL | Status: AC
  Administered 2016-07-14: 30 g via ORAL
  Filled 2016-07-14: qty 120

## 2016-07-14 NOTE — Progress Notes (Signed)
[place] at [place] NAME: Kelly [name]    MR#:  [id number]  DATE OF BIRTH:  01-01-1941  Found  to have hyperkalemia with potassium more than 6 on labs today. Received Kayexalate, insulin, dextrose, calcium gluconate. Now has diarrhea because of Kayexalate. Scheduled to have kidney biopsy tomorrow. Discussed with Dr.Harmeet Candiss Norse..  Denies  shortness of breath, urine output good last night. Had hypoxia with O2 sats 89% on room air documented.   CHIEF COMPLAINT:   Chief Complaint  Patient presents with  . Abnormal Lab  . Flank Pain    REVIEW OF SYSTEMS:    Review of Systems  Constitutional: Negative for chills and fever.  HENT: Negative for hearing loss.   Eyes: Negative for blurred vision, double vision and photophobia.  Respiratory: Negative for cough, hemoptysis and shortness of breath.   Cardiovascular: Negative for palpitations, orthopnea and leg swelling.  Gastrointestinal: Negative for abdominal pain, diarrhea and vomiting.  Genitourinary: Negative for dysuria and urgency.  Musculoskeletal: Negative for myalgias and neck pain.  Skin: Negative for rash.  Neurological: Negative for dizziness, focal weakness, seizures, weakness and headaches.  Psychiatric/Behavioral: Negative for memory loss. The patient does not have insomnia.     Nutrition:  Tolerating Diet: Tolerating PT:      DRUG ALLERGIES:   Allergies  Allergen Reactions  . Amoxicillin   . Morphine And Related     VITALS:  Blood pressure (!) 161/70, pulse 79, temperature 98.2 F (36.8 C), temperature source Oral, resp. rate 19, height 5\' 4"  (1.626 m), weight 63.5 kg (140 lb), SpO2 95 %.  PHYSICAL EXAMINATION:   Physical Exam  GENERAL:  76 y.o.-year-old patient lying in the bed with no acute distress.  EYES: Pupils equal, round, reactive to light and accommodation. No scleral icterus. Extraocular muscles intact.  HEENT: Head atraumatic,  normocephalic. Oropharynx and nasopharynx clear.  NECK:  Supple, no jugular venous distention. No thyroid enlargement, no tenderness.  LUNGS: Normal breath sounds bilaterally, no wheezing, rales,rhonchi or crepitation. No use of accessory muscles of respiration.  CARDIOVASCULAR: S1, S2 normal. No murmurs, rubs, or gallops.  ABDOMEN: Soft, nontender, nondistended. Bowel sounds present. No organomegaly or mass.  EXTREMITIES: No pedal edema, cyanosis, or clubbing.  NEUROLOGIC: Cranial nerves II through XII are intact. Muscle strength 5/5 in all extremities. Sensation intact. Gait not checked.  PSYCHIATRIC: The patient is alert and oriented x 3.  SKIN: No obvious rash, lesion, or ulcer.    LABORATORY PANEL:   CBC  Recent Labs Lab 07/13/16 1223  WBC 6.0  HGB 11.3*  HCT 33.5*  PLT 121*   ------------------------------------------------------------------------------------------------------------------  Chemistries   Recent Labs Lab 07/09/16 0312  07/14/16 0830  NA 143  < > 138  K 4.4  < > 5.0  CL 113*  < > 108  CO2 22  < > 23  GLUCOSE 89  < > 184*  BUN 76*  < > 61*  CREATININE 7.28*  < > 5.86*  CALCIUM 8.4*  < > 8.4*  AST 18  --   --   ALT 16  --   --   ALKPHOS 58  --   --   BILITOT 0.7  --   --   < > = values in this interval not displayed. ------------------------------------------------------------------------------------------------------------------  Cardiac Enzymes No results for input(s): TROPONINI in the last 168 hours. ------------------------------------------------------------------------------------------------------------------  RADIOLOGY:  Dg Chest 1 View  Result Date: 07/14/2016 CLINICAL DATA:  Hypoxia EXAM:  CHEST 1 VIEW COMPARISON:  Chest x-ray of 06/16/2006 FINDINGS: There are somewhat prominent interstitial markings with tiny pleural effusions most consistent with mild interstitial edema. Mild cardiomegaly is present. No bony abnormality is seen.  Prominence paramediastinal soft tissues probably reflect ectatic great vessels. IMPRESSION: Cardiomegaly and mild interstitial edema with small effusions. Electronically Signed   By: Ivar Drape M.D.   On: 07/14/2016 08:41     ASSESSMENT AND PLAN:   Active Problems:   AKI (acute kidney injury) ([place])  1. acute right flank pain with acute renal failure likely due to ATN: CT of the kidneys did not show any hydronephrosis. Ultrasound of kidneys showed no hydro.medical renal disease,. Continue IV hydration and monitor kidney function closely,  Avoid diuretics, lisinopril..kidney function i at 75 cc/hr. Patient has hyperkalemia today, improved with shifting measures, kidney function slightly worse today, scheduled for kidney biopsy tomorrow, continue gentle hydration till tomorrow, discussed this with patient, patient's that daughter. Discuss with nephrology. Chest x-ray showed mild fluid overload but she is not hypoxic or shortn of breath. Npo after midnight #2 diabetes mellitus type 2: Hypoglycemia t resolved..continue SSI with coverage , #3 essential hypertension: Controlled #4 GERD: Continue PPI but the sucralfate is stopped  5.diarrhoearesolved. D/w daughter All the records are reviewed and case discussed with Care Management/Social Workerr. Management plans discussed with the patient, family and they are in agreement.  CODE STATUS:full  TOTAL TIME TAKING CARE OF THIS PATIENT: 35 minutes.   POSSIBLE D/C IN 1-2 DAYS, DEPENDING ON CLINICAL CONDITION.   Epifanio Lesches M.D on 07/14/2016 at 1:23 PM  Between 7am to 6pm - Pager - [phone]  After 6pm go to [email or web address] - password [email or web address] [facility]  Office  [phone]  CC: Primary care physician; Albina Billet, MD

## 2016-07-14 NOTE — Progress Notes (Signed)
[facility]  ROUNDING NOTE   Subjective:   Continued on IV fluids States she is able to void good No SOB No Leg edema UOP > 4850 cc S Cr further improved slightly to 5.86.    Objective:  Vital signs in last 24 hours:  Temp:  [98 F (36.7 C)-98.3 F (36.8 C)] 98 F (36.7 C) (03/08 1500) Pulse Rate:  [77-81] 77 (03/08 1500) Resp:  [18-20] 18 (03/08 1500) BP: (124-161)/(54-70) 135/64 (03/08 1500) SpO2:  [89 %-96 %] 96 % (03/08 1500)  Weight change:  Filed Weights   07/08/16 1247 07/08/16 1252  Weight: 63.5 kg (140 lb) 63.5 kg (140 lb)    Intake/Output: I/O last 3 completed shifts: In: 5329 [P.O.:1140; I.V.:2710] Out: 9242 [Urine:6050]   Intake/Output this shift:  Total I/O In: 719 [P.O.:240; I.V.:479] Out: 800 [Urine:800]  Physical Exam: General: NAD, laying in bed  Head: Normocephalic, atraumatic. Moist oral mucosal membranes  Eyes: Anicteric,  Neck: Supple, trachea midline  Lungs:  Clear to auscultation  Heart: Regular rate and rhythm  Abdomen:  Soft, nontender,   Extremities:  no peripheral edema.  Neurologic: Nonfocal, moving all four extremities  Skin: No lesions       Basic Metabolic Panel:  Recent Labs Lab 07/11/16 0333 07/12/16 0434 07/12/16 1634 07/13/16 1223 07/14/16 0447 07/14/16 0830  NA 140 144 142 142 136 138  K 5.0 4.8 4.9 4.9 6.3* 5.0  CL 114* 115* 113* 111 109 108  CO2 22 23 23 24  21* 23  GLUCOSE 179* 87 145* 87 130* 184*  BUN 68* 60* 57* 58* 62* 61*  CREATININE 6.43* 6.08* 5.99* 5.66* 5.79* 5.86*  CALCIUM 8.0* 8.4* 7.9* 8.7* 8.3* 8.4*  PHOS 3.0  --   --   --  3.2  --     Liver Function Tests:  Recent Labs Lab 07/09/16 0312 07/11/16 0333 07/14/16 0447  AST 18  --   --   ALT 16  --   --   ALKPHOS 58  --   --   BILITOT 0.7  --   --   PROT 5.5*  --   --   ALBUMIN 3.0* 2.9* 3.1*   No results for input(s): LIPASE, AMYLASE in the last 168 hours. No results for input(s): AMMONIA in the last 168  hours.  CBC:  Recent Labs Lab 07/08/16 1253 07/09/16 0312 07/13/16 1223  WBC 5.9 4.8 6.0  NEUTROABS 4.1  --   --   HGB 11.9* 10.6* 11.3*  HCT 36.1 31.8* 33.5*  MCV 91.3 91.7 90.2  PLT 156 134* 121*    Cardiac Enzymes: No results for input(s): CKTOTAL, CKMB, CKMBINDEX, TROPONINI in the last 168 hours.  BNP: Invalid input(s): POCBNP  CBG:  Recent Labs Lab 07/13/16 1111 07/13/16 1616 07/13/16 2123 07/14/16 0721 07/14/16 1142  GLUCAP 120* 129* 143* 107* 117*    Microbiology: Results for orders placed or performed during the hospital encounter of 07/08/16  Urine culture     Status: None   Collection Time: 07/08/16 12:54 PM  Result Value Ref Range Status   Specimen Description URINE, CLEAN CATCH  Final   Special Requests NONE  Final   Culture   Final    NO GROWTH Performed at [facility], [place] [address], [place], [place] [address]    Report Status 07/09/2016 FINAL  Final    Coagulation Studies: No results for input(s): LABPROT, INR in the last 72 hours.  Urinalysis: No results for input(s):  COLORURINE, LABSPEC, [place], GLUCOSEU, HGBUR, BILIRUBINUR, KETONESUR, PROTEINUR, UROBILINOGEN, NITRITE, LEUKOCYTESUR in the last 72 hours.  Invalid input(s): APPERANCEUR    Imaging: Dg Chest 1 View  Result Date: 07/14/2016 CLINICAL DATA:  Hypoxia EXAM: CHEST 1 VIEW COMPARISON:  Chest x-ray of 06/16/2006 FINDINGS: There are somewhat prominent interstitial markings with tiny pleural effusions most consistent with mild interstitial edema. Mild cardiomegaly is present. No bony abnormality is seen. Prominence paramediastinal soft tissues probably reflect ectatic great vessels. IMPRESSION: Cardiomegaly and mild interstitial edema with small effusions. Electronically Signed   By: Ivar Drape M.D.   On: 07/14/2016 08:41     Medications:   . sodium chloride 75 mL/hr at 07/14/16 0544   . amLODipine  5 mg Oral Daily  . atorvastatin  10 mg Oral Daily  . dextrose  25 g  Intravenous Once  . feeding supplement (GLUCERNA SHAKE)  237 mL Oral BID BM  . gabapentin  300 mg Oral QHS  . insulin aspart  0-5 Units Subcutaneous QHS  . insulin aspart  0-9 Units Subcutaneous TID WC  . pantoprazole  40 mg Oral Daily  . PARoxetine  25 mg Oral Daily   acetaminophen **OR** acetaminophen, ALPRAZolam, meclizine, ondansetron  Assessment/ Plan:  Kelly [name] is a 76 y.o. white female with insulin dependent diabetes mellitus type II, hypertension, hyperlipidemia, depression, anxiety, GERD, who was admitted to [facility] on 07/08/2016    1. Acute renal failure on chronic kidney disease stage III: bland urine on urinalysis. Baseline creatinine of 1.4, GFR of 35 02/2016. Chronic kidney disease secondary to diabetic nephropathy.   . Discussed renal failure with patient and daughter.  - SPEP/UPEP, ANA, anti-GBM, ANCA are negative - hold enalapril and hydrochlorothiazide. - Continue IV fluids: 1/2 NS at 89mL/hr - monitor urine output, volume status and renal function; UOP 4800 cc - renally dose all medications.  - S Cr has not improved as expected. Renal biopsy tomorrow Discussed role of biopsy with patient. - Risks, benefits and alternatives discussed. Patient has agreed to proceed   2. Hypertension:  Blood pressure control is acceptable. Holding enalapril and hydrochlorothiazide - amlodipine.  - iv labetalol prn for BP > 160  3. Diabetes mellitus type II with chronic kidney disease: insulin dependent. Hemoglobin A1c 7.1%  4. Hyperkalemia - low K diet - repeat K was within normal limits   LOS: 6 Gonzalo Waymire 3/8/20184:44 PM

## 2016-07-14 NOTE — Progress Notes (Signed)
07/14/2016  7:57 AM  Notified by lab tech pt potassium value of 6.3 this morning.  Paged attending MD Vianne Bulls to let her know.  Awaiting any new orders or instructions.  Dola Argyle, RN

## 2016-07-15 ENCOUNTER — Inpatient Hospital Stay: Payer: Medicare Other

## 2016-07-15 LAB — CBC WITH DIFFERENTIAL/PLATELET
Basophils Absolute: 0 10*3/uL (ref 0–0.1)
Basophils Relative: 1 %
Eosinophils Absolute: 0.1 10*3/uL (ref 0–0.7)
Eosinophils Relative: 3 %
HCT: 28.9 % — ABNORMAL LOW (ref 35.0–47.0)
Hemoglobin: 9.7 g/dL — ABNORMAL LOW (ref 12.0–16.0)
Lymphocytes Relative: 22 %
Lymphs Abs: 0.9 10*3/uL — ABNORMAL LOW (ref 1.0–3.6)
MCH: 30.2 pg (ref 26.0–34.0)
MCHC: 33.4 g/dL (ref 32.0–36.0)
MCV: 90.3 fL (ref 80.0–100.0)
Monocytes Absolute: 0.3 10*3/uL (ref 0.2–0.9)
Monocytes Relative: 6 %
Neutro Abs: 3 10*3/uL (ref 1.4–6.5)
Neutrophils Relative %: 68 %
Platelets: 114 10*3/uL — ABNORMAL LOW (ref 150–440)
RBC: 3.2 MIL/uL — ABNORMAL LOW (ref 3.80–5.20)
RDW: 14.1 % (ref 11.5–14.5)
WBC: 4.4 10*3/uL (ref 3.6–11.0)

## 2016-07-15 LAB — COMPREHENSIVE METABOLIC PANEL
ALT: 31 U/L (ref 14–54)
AST: 30 U/L (ref 15–41)
Albumin: 3.1 g/dL — ABNORMAL LOW (ref 3.5–5.0)
Alkaline Phosphatase: 48 U/L (ref 38–126)
Anion gap: 7 (ref 5–15)
BUN: 61 mg/dL — ABNORMAL HIGH (ref 6–20)
CO2: 24 mmol/L (ref 22–32)
Calcium: 8.6 mg/dL — ABNORMAL LOW (ref 8.9–10.3)
Chloride: 110 mmol/L (ref 101–111)
Creatinine, Ser: 5.9 mg/dL — ABNORMAL HIGH (ref 0.44–1.00)
GFR calc Af Amer: 7 mL/min — ABNORMAL LOW (ref >60)
GFR calc non Af Amer: 6 mL/min — ABNORMAL LOW (ref >60)
Glucose, Bld: 144 mg/dL — ABNORMAL HIGH (ref 65–99)
Potassium: 5 mmol/L (ref 3.5–5.1)
Sodium: 141 mmol/L (ref 135–145)
Total Bilirubin: 0.5 mg/dL (ref 0.3–1.2)
Total Protein: 5.7 g/dL — ABNORMAL LOW (ref 6.5–8.1)

## 2016-07-15 LAB — TYPE AND SCREEN
ABO/RH(D): O POS
Antibody Screen: NEGATIVE

## 2016-07-15 LAB — PROTIME-INR
INR: 0.98
Prothrombin Time: 13 seconds (ref 11.4–15.2)

## 2016-07-15 LAB — APTT: aPTT: 35 seconds (ref 24–36)

## 2016-07-15 LAB — GLUCOSE, CAPILLARY
Glucose-Capillary: 100 mg/dL — ABNORMAL HIGH (ref 65–99)
Glucose-Capillary: 158 mg/dL — ABNORMAL HIGH (ref 65–99)
Glucose-Capillary: 153 mg/dL — ABNORMAL HIGH (ref 65–99)

## 2016-07-15 MED ORDER — LORAZEPAM 0.5 MG PO TABS
0.5000 mg | ORAL_TABLET | Freq: Once | ORAL | Status: AC
  Administered 2016-07-15: 0.5 mg via ORAL
  Filled 2016-07-15: qty 1

## 2016-07-15 NOTE — Care Management Important Message (Signed)
Important Message  Patient Details  Name: Kelly [name] MRN: [id number] Date of Birth: January 21, 1941   Medicare Important Message Given:  Yes    Beverly Sessions, RN 07/15/2016, 11:25 AM

## 2016-07-15 NOTE — Progress Notes (Signed)
[place] at [place] NAME: Kelly [name]    MR#:  [id number]  DATE OF BIRTH:  May 26, 1940  Admitted for acute renal failure, creatinine 7 on admission, improved initially with IV hydration but now creatinine now kind of plateaued at 5.9, patient had a kidney biopsy today. Hopefully we will have preliminary results  tomorrow and the nephrology will discuss with family for further plan.-Shortness of breath, nausea. No hypoxia. Marland Kitchen   CHIEF COMPLAINT:   Chief Complaint  Patient presents with  . Abnormal Lab  . Flank Pain    REVIEW OF SYSTEMS:    Review of Systems  Constitutional: Negative for chills and fever.  HENT: Negative for hearing loss.   Eyes: Negative for blurred vision, double vision and photophobia.  Respiratory: Negative for cough, hemoptysis and shortness of breath.   Cardiovascular: Negative for palpitations, orthopnea and leg swelling.  Gastrointestinal: Negative for abdominal pain, diarrhea and vomiting.  Genitourinary: Negative for dysuria and urgency.  Musculoskeletal: Negative for myalgias and neck pain.  Skin: Negative for rash.  Neurological: Negative for dizziness, focal weakness, seizures, weakness and headaches.  Psychiatric/Behavioral: Negative for memory loss. The patient does not have insomnia.     Nutrition:  Tolerating Diet: Tolerating PT:      DRUG ALLERGIES:   Allergies  Allergen Reactions  . Amoxicillin   . Morphine And Related     VITALS:  Blood pressure (!) 117/48, pulse 76, temperature 98.2 F (36.8 C), temperature source Oral, resp. rate 18, height 5\' 4"  (1.626 m), weight 63.5 kg (140 lb), SpO2 92 %.  PHYSICAL EXAMINATION:   Physical Exam  GENERAL:  76 y.o.-year-old patient lying in the bed with no acute distress.  EYES: Pupils equal, round, reactive to light and accommodation. No scleral icterus. Extraocular muscles intact.  HEENT: Head atraumatic, normocephalic. Oropharynx  and nasopharynx clear.  NECK:  Supple, no jugular venous distention. No thyroid enlargement, no tenderness.  LUNGS: Normal breath sounds bilaterally, no wheezing, rales,rhonchi or crepitation. No use of accessory muscles of respiration.  CARDIOVASCULAR: S1, S2 normal. No murmurs, rubs, or gallops.  ABDOMEN: Soft, nontender, nondistended. Bowel sounds present. No organomegaly or mass.  EXTREMITIES: No pedal edema, cyanosis, or clubbing.  NEUROLOGIC: Cranial nerves II through XII are intact. Muscle strength 5/5 in all extremities. Sensation intact. Gait not checked.  PSYCHIATRIC: The patient is alert and oriented x 3.  SKIN: No obvious rash, lesion, or ulcer.    LABORATORY PANEL:   CBC  Recent Labs Lab 07/15/16 0433  WBC 4.4  HGB 9.7*  HCT 28.9*  PLT 114*   ------------------------------------------------------------------------------------------------------------------  Chemistries   Recent Labs Lab 07/15/16 0433  NA 141  K 5.0  CL 110  CO2 24  GLUCOSE 144*  BUN 61*  CREATININE 5.90*  CALCIUM 8.6*  AST 30  ALT 31  ALKPHOS 48  BILITOT 0.5   ------------------------------------------------------------------------------------------------------------------  Cardiac Enzymes No results for input(s): TROPONINI in the last 168 hours. ------------------------------------------------------------------------------------------------------------------  RADIOLOGY:  Dg Chest 1 View  Result Date: 07/14/2016 CLINICAL DATA:  Hypoxia EXAM: CHEST 1 VIEW COMPARISON:  Chest x-ray of 06/16/2006 FINDINGS: There are somewhat prominent interstitial markings with tiny pleural effusions most consistent with mild interstitial edema. Mild cardiomegaly is present. No bony abnormality is seen. Prominence paramediastinal soft tissues probably reflect ectatic great vessels. IMPRESSION: Cardiomegaly and mild interstitial edema with small effusions. Electronically Signed   By: Ivar Drape M.D.   On:  07/14/2016 08:41  US Biopsy-no Radiologist  Result Date: 07/15/2016 CLINICAL DATA:  Right renal core biopsy.  Acute renal failure. EXAM: ULTRASOUND GUIDED CORE NEEDLE BIOPSY OF RIGHT KIDNEY COMPARISON:  None FINDINGS: Ultrasound-guided core biopsy performed of the right kidney by Dr. Candiss Norse without a radiologist present. Five core biopsies of the lower pole of the right kidney were performed. Postprocedural ultrasound evaluation of the right kidney demonstrates a small perinephric hematoma around the lower pole measuring approximately 2.9 x 1.7 cm. No obstructive uropathy. IMPRESSION: 1. Ultrasound-guided core biopsy performed of the right kidney by Dr. Candiss Norse without a radiologist present. 2. Small right perinephric hematoma. Electronically Signed   By: Kathreen Devoid   On: 07/15/2016 12:05     ASSESSMENT AND PLAN:   Active Problems:   AKI (acute kidney injury) ([place])  1. acute right flank pain with acute renal failure likely due to ATN: CT of the kidneys did not show any hydronephrosis. Ultrasound of kidneys showed no hydro.medical renal disease,. Continue IV hydration and monitor kidney function closely,  Avoid diuretics, lisinopril.. Status post kidney biopsy, potassium was elevated yesterday but improved. Creatinine, and admission, now 5.9, patient's GFR is 7. Follow the kidney biopsy results, final disposition depends upon biopsy results, discussed this with patient's family, nephrology.   P#2 diabetes mellitus type 2: Hypoglycemia resolved..continue SSI with coverage , #3 essential hypertension: Controlled #4 GERD: Continue PPI but the sucralfate is stopped  5.diarrhoearesolved.  D/w daughter in detail.   All the records are reviewed and case discussed with Care Management/Social Workerr. Management plans discussed with the patient, family and they are in agreement.  CODE STATUS:full  TOTAL TIME TAKING CARE OF THIS PATIENT: 35 minutes.   POSSIBLE D/C IN 1-2 DAYS, DEPENDING ON  CLINICAL CONDITION.   Epifanio Lesches M.D on 07/15/2016 at 1:48 PM  Between 7am to 6pm - Pager - [phone]  After 6pm go to [email or web address] - password [email or web address] [facility]  Office  [phone]  CC: Primary care physician; Albina Billet, MD

## 2016-07-15 NOTE — Progress Notes (Signed)
Vitals:   07/15/16 1130 07/15/16 1150  BP: 128/75 (!) 117/48  Pulse: 70 76  Resp: 18   Temp:      Doing well after biopsy No significant pain Await results

## 2016-07-15 NOTE — Procedures (Signed)
PROCEDURE: Informed written consent was obtained from the patient after a discussion of the risks, benefits and alternatives to treatment. The patient understands and consents the procedure. A timeout was performed prior to the initiation of the procedure.   Ultrasound scanning was performed of the bilateral flanks. The inferior pole of the RIGHT kidney was selected for biopsy due to location and sonographic window. The procedure was planned. It was difficult procedure because of the orientation of right kidney and its proximity to rib. Left renal cortex was too thin.The operative site was prepped and draped in the usual sterile fashion. The overlying soft tissues were anesthetized with 10 mL of 1% XYLOCAINE.  A 18 gauge core needle biopsy device was advanced into the inferior cortex of the right kidney and ONE core biopsies were obtained under direct ultrasound guidance (5 passes were made). Real time pathologic review confirmed adequate tissue acquisition. Images were saved for documentation purposes. The biopsy device was removed and hemostasis was obtained with manual compression. Post procedural scanning was negative for significant post procedural hemorrhage or additional complication. A dressing was placed. The patient tolerated the procedure well without immediate post procedural complication.

## 2016-07-16 LAB — BASIC METABOLIC PANEL WITH GFR
Anion gap: 9 (ref 5–15)
BUN: 61 mg/dL — ABNORMAL HIGH (ref 6–20)
CO2: 24 mmol/L (ref 22–32)
Calcium: 8.3 mg/dL — ABNORMAL LOW (ref 8.9–10.3)
Chloride: 109 mmol/L (ref 101–111)
Creatinine, Ser: 5.44 mg/dL — ABNORMAL HIGH (ref 0.44–1.00)
GFR calc Af Amer: 8 mL/min — ABNORMAL LOW
GFR calc non Af Amer: 7 mL/min — ABNORMAL LOW
Glucose, Bld: 152 mg/dL — ABNORMAL HIGH (ref 65–99)
Potassium: 4.9 mmol/L (ref 3.5–5.1)
Sodium: 142 mmol/L (ref 135–145)

## 2016-07-16 LAB — CBC
HCT: 28 % — ABNORMAL LOW (ref 35.0–47.0)
Hemoglobin: 9.6 g/dL — ABNORMAL LOW (ref 12.0–16.0)
MCH: 31.3 pg (ref 26.0–34.0)
MCHC: 34.3 g/dL (ref 32.0–36.0)
MCV: 91.4 fL (ref 80.0–100.0)
Platelets: 122 10*3/uL — ABNORMAL LOW (ref 150–440)
RBC: 3.07 MIL/uL — ABNORMAL LOW (ref 3.80–5.20)
RDW: 14 % (ref 11.5–14.5)
WBC: 5.4 10*3/uL (ref 3.6–11.0)

## 2016-07-16 LAB — GLUCOSE, CAPILLARY
Glucose-Capillary: 117 mg/dL — ABNORMAL HIGH (ref 65–99)
Glucose-Capillary: 231 mg/dL — ABNORMAL HIGH (ref 65–99)
Glucose-Capillary: 130 mg/dL — ABNORMAL HIGH (ref 65–99)
Glucose-Capillary: 211 mg/dL — ABNORMAL HIGH (ref 65–99)

## 2016-07-16 NOTE — Progress Notes (Signed)
[facility]  ROUNDING NOTE   Subjective:   Doing well after the biopsy No significant complications. Site was a little sore afterwards but managed with tylenol K 4.9, Cr 5.44/ GFR 7 Able to eat good without nausea or vomiting    Objective:  Vital signs in last 24 hours:  Temp:  [98.1 F (36.7 C)-98.2 F (36.8 C)] 98.2 F (36.8 C) (03/10 0455) Pulse Rate:  [74-82] 76 (03/10 0946) Resp:  [17] 17 (03/10 0455) BP: (111-139)/(47-54) 139/52 (03/10 0946) SpO2:  [92 %-95 %] 95 % (03/10 0455)  Weight change:  Filed Weights   07/08/16 1247 07/08/16 1252  Weight: 63.5 kg (140 lb) 63.5 kg (140 lb)    Intake/Output: I/O last 3 completed shifts: In: 354.9 [P.O.:240; I.V.:114.9] Out: 2500 [Urine:2500]   Intake/Output this shift:  Total I/O In: 120 [P.O.:120] Out: 600 [Urine:600]  Physical Exam: General: NAD, laying in bed  Head: Normocephalic, atraumatic. Moist oral mucosal membranes  Eyes: Anicteric,  Neck: Supple, trachea midline  Lungs:  Clear to auscultation  Heart: Regular rate and rhythm  Abdomen:  Soft, nontender,   Extremities:  no peripheral edema.  Neurologic: Nonfocal, moving all four extremities  Skin: No lesions       Basic Metabolic Panel:  Recent Labs Lab 07/11/16 0333  07/13/16 1223 07/14/16 0447 07/14/16 0830 07/15/16 0433 07/16/16 0316  NA 140  < > 142 136 138 141 142  K 5.0  < > 4.9 6.3* 5.0 5.0 4.9  CL 114*  < > 111 109 108 110 109  CO2 22  < > 24 21* 23 24 24   GLUCOSE 179*  < > 87 130* 184* 144* 152*  BUN 68*  < > 58* 62* 61* 61* 61*  CREATININE 6.43*  < > 5.66* 5.79* 5.86* 5.90* 5.44*  CALCIUM 8.0*  < > 8.7* 8.3* 8.4* 8.6* 8.3*  PHOS 3.0  --   --  3.2  --   --   --   < > = values in this interval not displayed.  Liver Function Tests:  Recent Labs Lab 07/11/16 0333 07/14/16 0447 07/15/16 0433  AST  --   --  30  ALT  --   --  31  ALKPHOS  --   --  48  BILITOT  --   --  0.5  PROT  --   --  5.7*  ALBUMIN 2.9* 3.1*  3.1*   No results for input(s): LIPASE, AMYLASE in the last 168 hours. No results for input(s): AMMONIA in the last 168 hours.  CBC:  Recent Labs Lab 07/13/16 1223 07/15/16 0433 07/16/16 0316  WBC 6.0 4.4 5.4  NEUTROABS  --  3.0  --   HGB 11.3* 9.7* 9.6*  HCT 33.5* 28.9* 28.0*  MCV 90.2 90.3 91.4  PLT 121* 114* 122*    Cardiac Enzymes: No results for input(s): CKTOTAL, CKMB, CKMBINDEX, TROPONINI in the last 168 hours.  BNP: Invalid input(s): POCBNP  CBG:  Recent Labs Lab 07/15/16 0735 07/15/16 1624 07/15/16 2107 07/16/16 0727 07/16/16 1118  GLUCAP 100* 158* 153* 117* 231*    Microbiology: Results for orders placed or performed during the hospital encounter of 07/08/16  Urine culture     Status: None   Collection Time: 07/08/16 12:54 PM  Result Value Ref Range Status   Specimen Description URINE, CLEAN CATCH  Final   Special Requests NONE  Final   Culture   Final    NO GROWTH Performed at [facility]  Hospital Lab, [place] [address], [place], [place] [address]    Report Status 07/09/2016 FINAL  Final    Coagulation Studies:  Recent Labs  07/15/16 0433  LABPROT 13.0  INR 0.98    Urinalysis: No results for input(s): COLORURINE, LABSPEC, PHURINE, GLUCOSEU, HGBUR, BILIRUBINUR, KETONESUR, PROTEINUR, UROBILINOGEN, NITRITE, LEUKOCYTESUR in the last 72 hours.  Invalid input(s): APPERANCEUR    Imaging: US Biopsy-no Radiologist  Result Date: 07/15/2016 CLINICAL DATA:  Right renal core biopsy.  Acute renal failure. EXAM: ULTRASOUND GUIDED CORE NEEDLE BIOPSY OF RIGHT KIDNEY COMPARISON:  None FINDINGS: Ultrasound-guided core biopsy performed of the right kidney by Dr. Candiss Norse without a radiologist present. Five core biopsies of the lower pole of the right kidney were performed. Postprocedural ultrasound evaluation of the right kidney demonstrates a small perinephric hematoma around the lower pole measuring approximately 2.9 x 1.7 cm. No obstructive uropathy. IMPRESSION: 1.  Ultrasound-guided core biopsy performed of the right kidney by Dr. Candiss Norse without a radiologist present. 2. Small right perinephric hematoma. Electronically Signed   By: Kathreen Devoid   On: 07/15/2016 12:05     Medications:    . amLODipine  5 mg Oral Daily  . atorvastatin  10 mg Oral Daily  . dextrose  25 g Intravenous Once  . feeding supplement (GLUCERNA SHAKE)  237 mL Oral BID BM  . gabapentin  300 mg Oral QHS  . insulin aspart  0-5 Units Subcutaneous QHS  . insulin aspart  0-9 Units Subcutaneous TID WC  . pantoprazole  40 mg Oral Daily  . PARoxetine  25 mg Oral Daily   acetaminophen **OR** acetaminophen, ALPRAZolam, labetalol, meclizine, ondansetron  Assessment/ Plan:  [name] is a 76 y.o. white female with insulin dependent diabetes mellitus type II, hypertension, hyperlipidemia, depression, anxiety, GERD, who was admitted to [facility] on 07/08/2016    1. Acute renal failure on chronic kidney disease stage III: bland urine on urinalysis. Baseline creatinine of 1.4, GFR of 35 02/2016. Chronic kidney disease secondary to diabetic nephropathy.   - SPEP/UPEP, ANA, anti-GBM, ANCA are negative. Urine P/C ratio is 0.26.  - hold enalapril and hydrochlorothiazide. - monitor urine output, volume status and renal function;  - renally dose all medications.  - S Cr has not improved as expected. Renal biopsy done on Friday. Preliminary results awaited today   2. Hypertension:  Blood pressure control is acceptable. Holding enalapril and hydrochlorothiazide - amlodipine.  - iv labetalol prn for BP > 160  3. Diabetes mellitus type II with chronic kidney disease: insulin dependent. Hemoglobin A1c 7.1%  4. Hyperkalemia - low K diet     LOS: 8 Tayler Heiden 3/10/201811:35 AM Results for BETZABE, BEVANS (MRN [id number]) as of 07/16/2016 11:36  Ref. Range 07/09/2016 14:16 07/09/2016 14:16  Anit Nuclear Antibody(ANA) Latest Ref Range: Negative  Negative   ANCA Proteinase 3  Latest Ref Range: 0.0 - 3.5 U/mL <3.5 <3.5  GBM Ab Latest Ref Range: 0 - 20 units 2   Myeloperoxidase Abs Latest Ref Range: 0.0 - 9.0 U/mL <9.0 <9.0    Ref. Range 07/09/2016 14:16  M-SPIKE, % Latest Ref Range: Not Observed g/dL Not Observed    Ref. Range 07/11/2016 12:50  Protein Creatinine Ratio Latest Ref Range: 0.00 - 0.15 mg/mgCre 0.26 (H)

## 2016-07-16 NOTE — Progress Notes (Signed)
[place] at [place] NAME: Kelly [name]    MR#:  [id number]  DATE OF BIRTH:  May 26, 1940  Admitted for acute renal failure, creatinine 7 on admission, improved initially with IV hydration but now creatinine now kind of plateaued at 5.9, patient had a kidney biopsy today. Hopefully we will have preliminary results  tomorrow and the nephrology will discuss with family for further plan.-Shortness of breath, nausea. No hypoxia. Marland Kitchen   CHIEF COMPLAINT:   Chief Complaint  Patient presents with  . Abnormal Lab  . Flank Pain     No new compalins.  REVIEW OF SYSTEMS:    Review of Systems  Constitutional: Negative for chills and fever.  HENT: Negative for hearing loss.   Eyes: Negative for blurred vision, double vision and photophobia.  Respiratory: Negative for cough, hemoptysis and shortness of breath.   Cardiovascular: Negative for palpitations, orthopnea and leg swelling.  Gastrointestinal: Negative for abdominal pain, diarrhea and vomiting.  Genitourinary: Negative for dysuria and urgency.  Musculoskeletal: Negative for myalgias and neck pain.  Skin: Negative for rash.  Neurological: Negative for dizziness, focal weakness, seizures, weakness and headaches.  Psychiatric/Behavioral: Negative for memory loss. The patient does not have insomnia.     Nutrition:  Tolerating Diet: Tolerating PT:    DRUG ALLERGIES:   Allergies  Allergen Reactions  . Amoxicillin   . Morphine And Related     VITALS:  Blood pressure 136/63, pulse 80, temperature 98.2 F (36.8 C), temperature source Oral, resp. rate 18, height 5\' 4"  (1.626 m), weight 63.5 kg (140 lb), SpO2 96 %.  PHYSICAL EXAMINATION:   Physical Exam  GENERAL:  76 y.o.-year-old patient lying in the bed with no acute distress.  EYES: Pupils equal, round, reactive to light and accommodation. No scleral icterus. Extraocular muscles intact.  HEENT: Head atraumatic, normocephalic.  Oropharynx and nasopharynx clear.  NECK:  Supple, no jugular venous distention. No thyroid enlargement, no tenderness.  LUNGS: Normal breath sounds bilaterally, no wheezing, rales,rhonchi or crepitation. No use of accessory muscles of respiration.  CARDIOVASCULAR: S1, S2 normal. No murmurs, rubs, or gallops.  ABDOMEN: Soft, nontender, nondistended. Bowel sounds present. No organomegaly or mass.  EXTREMITIES: No pedal edema, cyanosis, or clubbing.  NEUROLOGIC: Cranial nerves II through XII are intact. Muscle strength 5/5 in all extremities. Sensation intact. Gait not checked.  PSYCHIATRIC: The patient is alert and oriented x 3.  SKIN: No obvious rash, lesion, or ulcer.    LABORATORY PANEL:   CBC  Recent Labs Lab 07/16/16 0316  WBC 5.4  HGB 9.6*  HCT 28.0*  PLT 122*   ------------------------------------------------------------------------------------------------------------------  Chemistries   Recent Labs Lab 07/15/16 0433 07/16/16 0316  NA 141 142  K 5.0 4.9  CL 110 109  CO2 24 24  GLUCOSE 144* 152*  BUN 61* 61*  CREATININE 5.90* 5.44*  CALCIUM 8.6* 8.3*  AST 30  --   ALT 31  --   ALKPHOS 48  --   BILITOT 0.5  --    ------------------------------------------------------------------------------------------------------------------  Cardiac Enzymes No results for input(s): TROPONINI in the last 168 hours. ------------------------------------------------------------------------------------------------------------------  RADIOLOGY:  US Biopsy-no Radiologist  Result Date: 07/15/2016 CLINICAL DATA:  Right renal core biopsy.  Acute renal failure. EXAM: ULTRASOUND GUIDED CORE NEEDLE BIOPSY OF RIGHT KIDNEY COMPARISON:  None FINDINGS: Ultrasound-guided core biopsy performed of the right kidney by Dr. Candiss Norse without a radiologist present. Five core biopsies of the lower pole of the right kidney were performed.  Postprocedural ultrasound evaluation of the right kidney  demonstrates a small perinephric hematoma around the lower pole measuring approximately 2.9 x 1.7 cm. No obstructive uropathy. IMPRESSION: 1. Ultrasound-guided core biopsy performed of the right kidney by Dr. Candiss Norse without a radiologist present. 2. Small right perinephric hematoma. Electronically Signed   By: Kathreen Devoid   On: 07/15/2016 12:05     ASSESSMENT AND PLAN:   Active Problems:   AKI (acute kidney injury) ([place])  1. acute right flank pain with acute renal failure likely due to ATN: CT of the kidneys did not show any hydronephrosis. Ultrasound of kidneys showed no hydro.medical renal disease,. Continue IV hydration and monitor kidney function closely,  Avoid diuretics, lisinopril.. Status post kidney biopsy, potassium was elevated yesterday but improved. Creatinine, and admission, now 5.9, patient's GFR is 7. Follow the kidney biopsy results, final disposition depends upon biopsy results, discussed this with patient's family, nephrology. Biopsy result is still not available yet.  #2 diabetes mellitus type 2: Hypoglycemia resolved..continue SSI with coverage  #3 essential hypertension: Controlled #4 GERD: Continue PPI but the sucralfate is stopped  #5.diarrhoearesolved.  D/w daughter in detail.   All the records are reviewed and case discussed with Care Management/Social Workerr. Management plans discussed with the patient, family and they are in agreement.  CODE STATUS:full  TOTAL TIME TAKING CARE OF THIS PATIENT: 35 minutes.   POSSIBLE D/C IN 1-2 DAYS, DEPENDING ON CLINICAL CONDITION.   Vaughan Basta M.D on 07/16/2016 at 2:17 PM  Between 7am to 6pm - Pager - [phone]  After 6pm go to [email or web address] - password [email or web address] [facility]  Office  [phone]  CC: Primary care physician; Albina Billet, MD

## 2016-07-17 LAB — RENAL FUNCTION PANEL
Albumin: 3.3 g/dL — ABNORMAL LOW (ref 3.5–5.0)
Anion gap: 7 (ref 5–15)
BUN: 64 mg/dL — ABNORMAL HIGH (ref 6–20)
CO2: 25 mmol/L (ref 22–32)
Calcium: 8.3 mg/dL — ABNORMAL LOW (ref 8.9–10.3)
Chloride: 109 mmol/L (ref 101–111)
Creatinine, Ser: 5.73 mg/dL — ABNORMAL HIGH (ref 0.44–1.00)
GFR calc Af Amer: 8 mL/min — ABNORMAL LOW (ref >60)
GFR calc non Af Amer: 7 mL/min — ABNORMAL LOW (ref >60)
Glucose, Bld: 153 mg/dL — ABNORMAL HIGH (ref 65–99)
Phosphorus: 4.4 mg/dL (ref 2.5–4.6)
Potassium: 4.5 mmol/L (ref 3.5–5.1)
Sodium: 141 mmol/L (ref 135–145)

## 2016-07-17 LAB — GLUCOSE, CAPILLARY
Glucose-Capillary: 133 mg/dL — ABNORMAL HIGH (ref 65–99)
Glucose-Capillary: 186 mg/dL — ABNORMAL HIGH (ref 65–99)
Glucose-Capillary: 156 mg/dL — ABNORMAL HIGH (ref 65–99)
Glucose-Capillary: 182 mg/dL — ABNORMAL HIGH (ref 65–99)

## 2016-07-17 NOTE — Progress Notes (Signed)
[facility]  ROUNDING NOTE   Subjective:   Doing well after the biopsy No significant complications. Site is a little sore afterwards but managed with tylenol K 4.5, Cr 5.73/ GFR 7 Able to eat good without nausea or vomiting. Reports rash over hands - ? From soap    Objective:  Vital signs in last 24 hours:  Temp:  [98.3 F (36.8 C)-98.5 F (36.9 C)] 98.3 F (36.8 C) (03/11 0649) Pulse Rate:  [79-81] 79 (03/11 0649) Resp:  [16-18] 16 (03/11 0649) BP: (106-137)/(51-65) 136/64 (03/11 1015) SpO2:  [90 %-96 %] 90 % (03/11 0649)  Weight change:  Filed Weights   07/08/16 1247 07/08/16 1252  Weight: 63.5 kg (140 lb) 63.5 kg (140 lb)    Intake/Output: I/O last 3 completed shifts: In: 1110 [P.O.:1110] Out: 2100 [Urine:2100]   Intake/Output this shift:  Total I/O In: 240 [P.O.:240] Out: 600 [Urine:600]  Physical Exam: General: NAD, laying in bed  Head: Normocephalic, atraumatic. Moist oral mucosal membranes  Eyes: Anicteric,  Neck: Supple, trachea midline  Lungs:  Clear to auscultation  Heart: Regular rate and rhythm  Abdomen:  Soft, nontender,   Extremities:  no peripheral edema.  Neurologic: Nonfocal, moving all four extremities  Skin: Slight redness over dorsal hands       Basic Metabolic Panel:  Recent Labs Lab 07/11/16 0333  07/14/16 0447 07/14/16 0830 07/15/16 0433 07/16/16 0316 07/17/16 0357  NA 140  < > 136 138 141 142 141  K 5.0  < > 6.3* 5.0 5.0 4.9 4.5  CL 114*  < > 109 108 110 109 109  CO2 22  < > 21* 23 24 24 25   GLUCOSE 179*  < > 130* 184* 144* 152* 153*  BUN 68*  < > 62* 61* 61* 61* 64*  CREATININE 6.43*  < > 5.79* 5.86* 5.90* 5.44* 5.73*  CALCIUM 8.0*  < > 8.3* 8.4* 8.6* 8.3* 8.3*  PHOS 3.0  --  3.2  --   --   --  4.4  < > = values in this interval not displayed.  Liver Function Tests:  Recent Labs Lab 07/11/16 0333 07/14/16 0447 07/15/16 0433 07/17/16 0357  AST  --   --  30  --   ALT  --   --  31  --   ALKPHOS   --   --  48  --   BILITOT  --   --  0.5  --   PROT  --   --  5.7*  --   ALBUMIN 2.9* 3.1* 3.1* 3.3*   No results for input(s): LIPASE, AMYLASE in the last 168 hours. No results for input(s): AMMONIA in the last 168 hours.  CBC:  Recent Labs Lab 07/13/16 1223 07/15/16 0433 07/16/16 0316  WBC 6.0 4.4 5.4  NEUTROABS  --  3.0  --   HGB 11.3* 9.7* 9.6*  HCT 33.5* 28.9* 28.0*  MCV 90.2 90.3 91.4  PLT 121* 114* 122*    Cardiac Enzymes: No results for input(s): CKTOTAL, CKMB, CKMBINDEX, TROPONINI in the last 168 hours.  BNP: Invalid input(s): POCBNP  CBG:  Recent Labs Lab 07/16/16 1118 07/16/16 1610 07/16/16 2109 07/17/16 0730 07/17/16 1114  GLUCAP 231* 130* 211* 133* 186*    Microbiology: Results for orders placed or performed during the hospital encounter of 07/08/16  Urine culture     Status: None   Collection Time: 07/08/16 12:54 PM  Result Value Ref Range Status   Specimen Description URINE,  CLEAN CATCH  Final   Special Requests NONE  Final   Culture   Final    NO GROWTH Performed at [facility], [place] [address], [place], [place] [address]    Report Status 07/09/2016 FINAL  Final    Coagulation Studies:  Recent Labs  07/15/16 0433  LABPROT 13.0  INR 0.98    Urinalysis: No results for input(s): COLORURINE, LABSPEC, PHURINE, GLUCOSEU, HGBUR, BILIRUBINUR, KETONESUR, PROTEINUR, UROBILINOGEN, NITRITE, LEUKOCYTESUR in the last 72 hours.  Invalid input(s): APPERANCEUR    Imaging: No results found.   Medications:    . amLODipine  5 mg Oral Daily  . atorvastatin  10 mg Oral Daily  . dextrose  25 g Intravenous Once  . feeding supplement (GLUCERNA SHAKE)  237 mL Oral BID BM  . gabapentin  300 mg Oral QHS  . insulin aspart  0-5 Units Subcutaneous QHS  . insulin aspart  0-9 Units Subcutaneous TID WC  . pantoprazole  40 mg Oral Daily  . PARoxetine  25 mg Oral Daily   acetaminophen **OR** acetaminophen, ALPRAZolam, labetalol, meclizine,  ondansetron  Assessment/ Plan:  Kelly [name] is a 76 y.o. white female with insulin dependent diabetes mellitus type II, hypertension, hyperlipidemia, depression, anxiety, GERD, who was admitted to [facility] on 07/08/2016    1. Acute renal failure on chronic kidney disease stage III: bland urine on urinalysis. Baseline creatinine of 1.4, GFR of 35 02/2016. Chronic kidney disease secondary to diabetic nephropathy.   - SPEP/UPEP, ANA, anti-GBM, ANCA are negative. Urine P/C ratio is 0.26.  - hold enalapril and hydrochlorothiazide. - monitor urine output, volume status and renal function;  - renally dose all medications.  - S Cr has not improved as expected. Renal biopsy done on Friday. Preliminary results awaited on Monday - Discussed options of RRT with patient - She is willing to take PD if necessary. Depending on results of biopsy, may place PD catheter prior to discharge -Potential limitations to PD could be that she has some pets at home. If she is unwilling to keep pets out of her bedroom, may need to go with HD   2. Hypertension:  Blood pressure control is acceptable. Holding enalapril and hydrochlorothiazide - amlodipine.  - iv labetalol prn for BP > 160  3. Diabetes mellitus type II with chronic kidney disease:  insulin dependent. Hemoglobin A1c 7.1%  4. Hyperkalemia - low K diet     LOS: 9 Abaigeal Moomaw 3/11/201812:09 PM Results for KAMRI, GOTSCH (MRN [id number]) as of 07/16/2016 11:36  Ref. Range 07/09/2016 14:16 07/09/2016 14:16  Anit Nuclear Antibody(ANA) Latest Ref Range: Negative  Negative   ANCA Proteinase 3 Latest Ref Range: 0.0 - 3.5 U/mL <3.5 <3.5  GBM Ab Latest Ref Range: 0 - 20 units 2   Myeloperoxidase Abs Latest Ref Range: 0.0 - 9.0 U/mL <9.0 <9.0    Ref. Range 07/09/2016 14:16  M-SPIKE, % Latest Ref Range: Not Observed g/dL Not Observed    Ref. Range 07/11/2016 12:50  Protein Creatinine Ratio Latest Ref Range: 0.00 - 0.15 mg/mgCre 0.26 (H)

## 2016-07-17 NOTE — Progress Notes (Signed)
[place] at [place] NAME: Kelly [name]    MR#:  [id number]  DATE OF BIRTH:  [birth year]  Admitted for acute renal failure, creatinine 7 on admission, improved initially with IV hydration but now creatinine now kind of plateaued at 5.9, patient had a kidney biopsy today. Hopefully we will have preliminary results  tomorrow and the nephrology will discuss with family for further plan.-Shortness of breath, nausea. No hypoxia. Marland Kitchen   CHIEF COMPLAINT:   Chief Complaint  Patient presents with  . Abnormal Lab  . Flank Pain     No new compalins. As per Dr. Candiss Norse biopsy report is still not available.  REVIEW OF SYSTEMS:    Review of Systems  Constitutional: Negative for chills and fever.  HENT: Negative for hearing loss.   Eyes: Negative for blurred vision, double vision and photophobia.  Respiratory: Negative for cough, hemoptysis and shortness of breath.   Cardiovascular: Negative for palpitations, orthopnea and leg swelling.  Gastrointestinal: Negative for abdominal pain, diarrhea and vomiting.  Genitourinary: Negative for dysuria and urgency.  Musculoskeletal: Negative for myalgias and neck pain.  Skin: Negative for rash.  Neurological: Negative for dizziness, focal weakness, seizures, weakness and headaches.  Psychiatric/Behavioral: Negative for memory loss. The patient does not have insomnia.     Nutrition:  Tolerating Diet: Tolerating PT:    DRUG ALLERGIES:   Allergies  Allergen Reactions  . Amoxicillin   . Morphine And Related     VITALS:  Blood pressure 136/66, pulse 78, temperature 98.5 F (36.9 C), temperature source Oral, resp. rate 16, height 5\' 4"  (1.626 m), weight 63.5 kg (140 lb), SpO2 93 %.  PHYSICAL EXAMINATION:   Physical Exam  GENERAL:  76 y.o.-year-old patient lying in the bed with no acute distress.  EYES: Pupils equal, round, reactive to light and accommodation. No scleral icterus. Extraocular  muscles intact.  HEENT: Head atraumatic, normocephalic. Oropharynx and nasopharynx clear.  NECK:  Supple, no jugular venous distention. No thyroid enlargement, no tenderness.  LUNGS: Normal breath sounds bilaterally, no wheezing, rales,rhonchi or crepitation. No use of accessory muscles of respiration.  CARDIOVASCULAR: S1, S2 normal. No murmurs, rubs, or gallops.  ABDOMEN: Soft, nontender, nondistended. Bowel sounds present. No organomegaly or mass.  EXTREMITIES: No pedal edema, cyanosis, or clubbing.  NEUROLOGIC: Cranial nerves II through XII are intact. Muscle strength 5/5 in all extremities. Sensation intact. Gait not checked.  PSYCHIATRIC: The patient is alert and oriented x 3.  SKIN: No obvious rash, lesion, or ulcer.    LABORATORY PANEL:   CBC  Recent Labs Lab 07/16/16 0316  WBC 5.4  HGB 9.6*  HCT 28.0*  PLT 122*   ------------------------------------------------------------------------------------------------------------------  Chemistries   Recent Labs Lab 07/15/16 0433  07/17/16 0357  NA 141  < > 141  K 5.0  < > 4.5  CL 110  < > 109  CO2 24  < > 25  GLUCOSE 144*  < > 153*  BUN 61*  < > 64*  CREATININE 5.90*  < > 5.73*  CALCIUM 8.6*  < > 8.3*  AST 30  --   --   ALT 31  --   --   ALKPHOS 48  --   --   BILITOT 0.5  --   --   < > = values in this interval not displayed. ------------------------------------------------------------------------------------------------------------------  Cardiac Enzymes No results for input(s): TROPONINI in the last 168 hours. ------------------------------------------------------------------------------------------------------------------  RADIOLOGY:  No results found.  ASSESSMENT AND PLAN:   Active Problems:   AKI (acute kidney injury) ([place])  1. acute right flank pain with acute renal failure likely due to ATN: CT of the kidneys did not show any hydronephrosis. Ultrasound of kidneys showed no hydro.medical renal  disease,. Continue IV hydration and monitor kidney function closely,  Avoid diuretics, lisinopril.. Status post kidney biopsy, potassium was elevated , but improved. Creatinine, and admission, now 5.9, patient's GFR is 7. Follow the kidney biopsy results, final disposition depends upon biopsy results, discussed this with patient's family, nephrology. Biopsy result is still not available.  #2 diabetes mellitus type 2: Hypoglycemia resolved..continue SSI with coverage  #3 essential hypertension: Controlled #4 GERD: Continue PPI but the sucralfate is stopped  #5.diarrhoearesolved.  D/w Dr. Candiss Norse.   All the records are reviewed and case discussed with Care Management/Social Workerr. Management plans discussed with the patient, family and they are in agreement.  CODE STATUS:full  TOTAL TIME TAKING CARE OF THIS PATIENT: 35 minutes.   POSSIBLE D/C IN 1-2 DAYS, DEPENDING ON CLINICAL CONDITION.   Vaughan Basta M.D on 07/17/2016 at 1:44 PM  Between 7am to 6pm - Pager - [phone]  After 6pm go to [email or web address] - password [email or web address] [facility]  Office  [phone]  CC: Primary care physician; Albina Billet, MD

## 2016-07-18 LAB — BASIC METABOLIC PANEL
Anion gap: 10 (ref 5–15)
BUN: 60 mg/dL — ABNORMAL HIGH (ref 6–20)
CO2: 22 mmol/L (ref 22–32)
Calcium: 8.7 mg/dL — ABNORMAL LOW (ref 8.9–10.3)
Chloride: 109 mmol/L (ref 101–111)
Creatinine, Ser: 5.27 mg/dL — ABNORMAL HIGH (ref 0.44–1.00)
GFR calc Af Amer: 8 mL/min — ABNORMAL LOW (ref >60)
GFR calc non Af Amer: 7 mL/min — ABNORMAL LOW (ref >60)
Glucose, Bld: 209 mg/dL — ABNORMAL HIGH (ref 65–99)
Potassium: 4.5 mmol/L (ref 3.5–5.1)
Sodium: 141 mmol/L (ref 135–145)

## 2016-07-18 LAB — C4 COMPLEMENT: Complement C4, Body Fluid: 34 mg/dL (ref 14–44)

## 2016-07-18 LAB — GLUCOSE, CAPILLARY
Glucose-Capillary: 121 mg/dL — ABNORMAL HIGH (ref 65–99)
Glucose-Capillary: 180 mg/dL — ABNORMAL HIGH (ref 65–99)
Glucose-Capillary: 180 mg/dL — ABNORMAL HIGH (ref 65–99)
Glucose-Capillary: 137 mg/dL — ABNORMAL HIGH (ref 65–99)

## 2016-07-18 LAB — C3 COMPLEMENT: C3 Complement: 154 mg/dL (ref 82–167)

## 2016-07-18 MED ORDER — FAMOTIDINE 20 MG PO TABS
20.0000 mg | ORAL_TABLET | Freq: Every day | ORAL | Status: DC
  Administered 2016-07-19 – 2016-07-20 (×2): 20 mg via ORAL
  Filled 2016-07-18 (×2): qty 1

## 2016-07-18 NOTE — Progress Notes (Signed)
[facility]  ROUNDING NOTE   Subjective:  Overall renal function about the same. Creatinine currently 5.27. Next line still awaiting renal biopsy result. She is noted to have been on Prilosec as an outpatient which can at times lead to interstitial nephritis. Patient appears to be having good urine output however.    Objective:  Vital signs in last 24 hours:  Temp:  [98 F (36.7 C)-98.4 F (36.9 C)] 98 F (36.7 C) (03/12 1300) Pulse Rate:  [76-81] 77 (03/12 1300) Resp:  [16-18] 17 (03/12 1300) BP: (105-140)/(51-60) 115/58 (03/12 1300) SpO2:  [93 %-97 %] 94 % (03/12 1300)  Weight change:  Filed Weights   07/08/16 1247 07/08/16 1252  Weight: 63.5 kg (140 lb) 63.5 kg (140 lb)    Intake/Output: I/O last 3 completed shifts: In: 79 [P.O.:720] Out: 3500 [Urine:3500]   Intake/Output this shift:  Total I/O In: 120 [P.O.:120] Out: 1000 [Urine:1000]  Physical Exam: General: NAD, laying in bed  Head: Normocephalic, atraumatic. Moist oral mucosal membranes  Eyes: Anicteric  Neck: Supple, trachea midline  Lungs:  Clear to auscultation, normal effort  Heart: Regular rate and rhythm  Abdomen:  Soft, nontender, bowel sounds prseent  Extremities: no peripheral edema.  Neurologic: Nonfocal, moving all four extremities  Skin: No rash       Basic Metabolic Panel:  Recent Labs Lab 07/14/16 0447 07/14/16 0830 07/15/16 0433 07/16/16 0316 07/17/16 0357 07/18/16 1001  NA 136 138 141 142 141 141  K 6.3* 5.0 5.0 4.9 4.5 4.5  CL 109 108 110 109 109 109  CO2 21* 23 24 24 25 22   GLUCOSE 130* 184* 144* 152* 153* 209*  BUN 62* 61* 61* 61* 64* 60*  CREATININE 5.79* 5.86* 5.90* 5.44* 5.73* 5.27*  CALCIUM 8.3* 8.4* 8.6* 8.3* 8.3* 8.7*  PHOS 3.2  --   --   --  4.4  --     Liver Function Tests:  Recent Labs Lab 07/14/16 0447 07/15/16 0433 07/17/16 0357  AST  --  30  --   ALT  --  31  --   ALKPHOS  --  48  --   BILITOT  --  0.5  --   PROT  --  5.7*  --    ALBUMIN 3.1* 3.1* 3.3*   No results for input(s): LIPASE, AMYLASE in the last 168 hours. No results for input(s): AMMONIA in the last 168 hours.  CBC:  Recent Labs Lab 07/13/16 1223 07/15/16 0433 07/16/16 0316  WBC 6.0 4.4 5.4  NEUTROABS  --  3.0  --   HGB 11.3* 9.7* 9.6*  HCT 33.5* 28.9* 28.0*  MCV 90.2 90.3 91.4  PLT 121* 114* 122*    Cardiac Enzymes: No results for input(s): CKTOTAL, CKMB, CKMBINDEX, TROPONINI in the last 168 hours.  BNP: Invalid input(s): POCBNP  CBG:  Recent Labs Lab 07/17/16 1114 07/17/16 1617 07/17/16 2103 07/18/16 0733 07/18/16 1146  GLUCAP 186* 156* 182* 121* 180*    Microbiology: Results for orders placed or performed during the hospital encounter of 07/08/16  Urine culture     Status: None   Collection Time: 07/08/16 12:54 PM  Result Value Ref Range Status   Specimen Description URINE, CLEAN CATCH  Final   Special Requests NONE  Final   Culture   Final    NO GROWTH Performed at [facility], [place] [address], [place], [place] [address]    Report Status 07/09/2016 FINAL  Final    Coagulation Studies:  No results for input(s): LABPROT, INR in the last 72 hours.  Urinalysis: No results for input(s): COLORURINE, LABSPEC, PHURINE, GLUCOSEU, HGBUR, BILIRUBINUR, KETONESUR, PROTEINUR, UROBILINOGEN, NITRITE, LEUKOCYTESUR in the last 72 hours.  Invalid input(s): APPERANCEUR    Imaging: No results found.   Medications:    . amLODipine  5 mg Oral Daily  . atorvastatin  10 mg Oral Daily  . dextrose  25 g Intravenous Once  . [START ON 07/19/2016] famotidine  20 mg Oral Daily  . feeding supplement (GLUCERNA SHAKE)  237 mL Oral BID BM  . gabapentin  300 mg Oral QHS  . insulin aspart  0-5 Units Subcutaneous QHS  . insulin aspart  0-9 Units Subcutaneous TID WC  . PARoxetine  25 mg Oral Daily   acetaminophen **OR** acetaminophen, ALPRAZolam, labetalol, meclizine, ondansetron  Assessment/ Plan:  [name] is  a 76 y.o. white female with insulin dependent diabetes mellitus type II, hypertension, hyperlipidemia, depression, anxiety, GERD, who was admitted to [facility] on 07/08/2016    1. Acute renal failure on chronic kidney disease stage III: bland urine on urinalysis. Baseline creatinine of 1.4, GFR of 35 02/2016. Chronic kidney disease secondary to diabetic nephropathy.  -  Increased renal echogenicity on renal US.   - SPEP/UPEP, ANA, anti-GBM, ANCA are negative. Urine P/C ratio is 0.26.  - Awaiting renal biopsy result at the moment. Further plan to be determined once the biopsy result is available. Patient noted to be on Prilosec at home and pantoprazole here. Both of these medications can lead to interstitial nephritis. Therefore we will discontinue pantoprazole for now.   2. Hypertension:   blood pressure currently 115/58.  Continue the patient on amlodipine. She has been taken off of hydrochlorothiazide as well as amlodipine.  3. Diabetes mellitus type II with chronic kidney disease:  insulin dependent. Hemoglobin A1c 7.1%      LOS: 10 [name] 3/12/20182:19 PM Results for SAINA, WAAGE (MRN [id number]) as of 07/16/2016 11:36  Ref. Range 07/09/2016 14:16 07/09/2016 14:16  Anit Nuclear Antibody(ANA) Latest Ref Range: Negative  Negative   ANCA Proteinase 3 Latest Ref Range: 0.0 - 3.5 U/mL <3.5 <3.5  GBM Ab Latest Ref Range: 0 - 20 units 2   Myeloperoxidase Abs Latest Ref Range: 0.0 - 9.0 U/mL <9.0 <9.0    Ref. Range 07/09/2016 14:16  M-SPIKE, % Latest Ref Range: Not Observed g/dL Not Observed    Ref. Range 07/11/2016 12:50  Protein Creatinine Ratio Latest Ref Range: 0.00 - 0.15 mg/mgCre 0.26 (H)

## 2016-07-18 NOTE — Progress Notes (Signed)
[name] NAME: Kelly [name]    MR#:  [id number]  DATE OF BIRTH:  November 17, 1940  CHIEF COMPLAINT:   Chief Complaint  Patient presents with  . Abnormal Lab  . Flank Pain     Patient has some flank pain but otherwise denies any nausea vomiting or diarrhea  REVIEW OF SYSTEMS:    Review of Systems  Constitutional: Negative for chills and fever.  HENT: Negative for hearing loss.   Eyes: Negative for blurred vision, double vision and photophobia.  Respiratory: Negative for cough, hemoptysis and shortness of breath.   Cardiovascular: Negative for palpitations, orthopnea and leg swelling.  Gastrointestinal: Negative for abdominal pain, diarrhea and vomiting.  Genitourinary: Negative for dysuria and urgency.  Musculoskeletal: Negative for myalgias and neck pain.  Skin: Negative for rash.  Neurological: Negative for dizziness, focal weakness, seizures, weakness and headaches.  Psychiatric/Behavioral: Negative for memory loss. The patient does not have insomnia.     Nutrition:  Tolerating Diet: Tolerating PT:    DRUG ALLERGIES:   Allergies  Allergen Reactions  . Amoxicillin   . Morphine And Related     VITALS:  Blood pressure (!) 115/58, pulse 77, temperature 98 F (36.7 C), temperature source Oral, resp. rate 17, height 5\' 4"  (1.626 m), weight 140 lb (63.5 kg), SpO2 94 %.  PHYSICAL EXAMINATION:   Physical Exam  GENERAL:  76 y.o.-year-old patient lying in the bed with no acute distress.  EYES: Pupils equal, round, reactive to light and accommodation. No scleral icterus. Extraocular muscles intact.  HEENT: Head atraumatic, normocephalic. Oropharynx and nasopharynx clear.  NECK:  Supple, no jugular venous distention. No thyroid enlargement, no tenderness.  LUNGS: Normal breath sounds bilaterally, no wheezing, rales,rhonchi or crepitation. No use of accessory muscles of respiration.  CARDIOVASCULAR: S1, S2  normal. No murmurs, rubs, or gallops.  ABDOMEN: Soft, nontender, nondistended. Bowel sounds present. No organomegaly or mass.  EXTREMITIES: No pedal edema, cyanosis, or clubbing.  NEUROLOGIC: Cranial nerves II through XII are intact. Muscle strength 5/5 in all extremities. Sensation intact. Gait not checked.  PSYCHIATRIC: The patient is alert and oriented x 3.  SKIN: No obvious rash, lesion, or ulcer.    LABORATORY PANEL:   CBC  Recent Labs Lab 07/16/16 0316  WBC 5.4  HGB 9.6*  HCT 28.0*  PLT 122*   ------------------------------------------------------------------------------------------------------------------  Chemistries   Recent Labs Lab 07/15/16 0433  07/18/16 1001  NA 141  < > 141  K 5.0  < > 4.5  CL 110  < > 109  CO2 24  < > 22  GLUCOSE 144*  < > 209*  BUN 61*  < > 60*  CREATININE 5.90*  < > 5.27*  CALCIUM 8.6*  < > 8.7*  AST 30  --   --   ALT 31  --   --   ALKPHOS 48  --   --   BILITOT 0.5  --   --   < > = values in this interval not displayed. ------------------------------------------------------------------------------------------------------------------  Cardiac Enzymes No results for input(s): TROPONINI in the last 168 hours. ------------------------------------------------------------------------------------------------------------------  RADIOLOGY:  No results found.   ASSESSMENT AND PLAN:   Active Problems:   AKI (acute kidney injury) ([place])  1. acute right flank pain with acute renal failure likely due to ATN:  Further plan based on biopsy results  2. diabetes mellitus type 2: Hypoglycemia resolved..continue SSI with coverage  3. essential hypertension: bp stable  4. GERD: Continue PPI but the sucralfate is stopped  5, diarrhea resolved.  I discussed the case with nephrology   All the records are reviewed and case discussed with Care Management/Social Workerr. Management plans discussed with the patient, family and they are in  agreement.  CODE STATUS:full  TOTAL TIME TAKING CARE OF THIS PATIENT: 32 minutes.   POSSIBLE D/C IN 1-2 DAYS, DEPENDING ON CLINICAL CONDITION.   Dustin Flock M.D on 07/18/2016 at 2:15 PM  Between 7am to 6pm - Pager - [phone]  After 6pm go to [email or web address] - password [email or web address] [facility]  Office  [phone]  CC: Primary care physician; Albina Billet, MD

## 2016-07-19 ENCOUNTER — Encounter: Payer: Self-pay | Admitting: Nephrology

## 2016-07-19 LAB — RENAL FUNCTION PANEL
Albumin: 3.3 g/dL — ABNORMAL LOW (ref 3.5–5.0)
Anion gap: 9 (ref 5–15)
BUN: 61 mg/dL — ABNORMAL HIGH (ref 6–20)
CO2: 26 mmol/L (ref 22–32)
Calcium: 8.9 mg/dL (ref 8.9–10.3)
Chloride: 106 mmol/L (ref 101–111)
Creatinine, Ser: 5.21 mg/dL — ABNORMAL HIGH (ref 0.44–1.00)
GFR calc Af Amer: 8 mL/min — ABNORMAL LOW (ref >60)
GFR calc non Af Amer: 7 mL/min — ABNORMAL LOW (ref >60)
Glucose, Bld: 160 mg/dL — ABNORMAL HIGH (ref 65–99)
Phosphorus: 3.9 mg/dL (ref 2.5–4.6)
Potassium: 4.8 mmol/L (ref 3.5–5.1)
Sodium: 141 mmol/L (ref 135–145)

## 2016-07-19 LAB — GLUCOSE, CAPILLARY
Glucose-Capillary: 126 mg/dL — ABNORMAL HIGH (ref 65–99)
Glucose-Capillary: 238 mg/dL — ABNORMAL HIGH (ref 65–99)
Glucose-Capillary: 125 mg/dL — ABNORMAL HIGH (ref 65–99)
Glucose-Capillary: 321 mg/dL — ABNORMAL HIGH (ref 65–99)

## 2016-07-19 LAB — SURGICAL PATHOLOGY

## 2016-07-19 MED ORDER — METHYLPREDNISOLONE SODIUM SUCC 125 MG IJ SOLR
60.0000 mg | Freq: Every day | INTRAMUSCULAR | Status: AC
  Administered 2016-07-19 – 2016-07-20 (×2): 60 mg via INTRAVENOUS
  Filled 2016-07-19 (×2): qty 2

## 2016-07-19 MED ORDER — GLIPIZIDE 5 MG PO TABS
2.5000 mg | ORAL_TABLET | Freq: Every day | ORAL | Status: DC
  Administered 2016-07-20: 2.5 mg via ORAL
  Filled 2016-07-19: qty 1

## 2016-07-19 NOTE — Progress Notes (Signed)
[facility]  ROUNDING NOTE   Subjective:  Renal biopsy report is back. It appears that she has both interstitial nephritis as well as acute tubular necrosis. Prilosec could potentially have been the culprit as well as sucralfate.    Objective:  Vital signs in last 24 hours:  Temp:  [97.9 F (36.6 C)-98.8 F (37.1 C)] 98.2 F (36.8 C) (03/13 1243) Pulse Rate:  [79-80] 80 (03/13 1243) Resp:  [17-19] 18 (03/13 1243) BP: (104-125)/(49-54) 125/54 (03/13 1243) SpO2:  [94 %-97 %] 94 % (03/13 1243)  Weight change:  Filed Weights   07/08/16 1247 07/08/16 1252  Weight: 63.5 kg (140 lb) 63.5 kg (140 lb)    Intake/Output: I/O last 3 completed shifts: In: 480 [P.O.:480] Out: 4250 [Urine:4250]   Intake/Output this shift:  Total I/O In: 120 [P.O.:120] Out: -   Physical Exam: General: NAD, laying in bed  Head: Normocephalic, atraumatic. Moist oral mucosal membranes  Eyes: Anicteric  Neck: Supple, trachea midline  Lungs:  Clear to auscultation, normal effort  Heart: Regular rate and rhythm  Abdomen:  Soft, nontender, bowel sounds prseent  Extremities: no peripheral edema.  Neurologic: Nonfocal, moving all four extremities  Skin: No rash       Basic Metabolic Panel:  Recent Labs Lab 07/14/16 0447  07/15/16 0433 07/16/16 0316 07/17/16 0357 07/18/16 1001 07/19/16 0407  NA 136  < > 141 142 141 141 141  K 6.3*  < > 5.0 4.9 4.5 4.5 4.8  CL 109  < > 110 109 109 109 106  CO2 21*  < > 24 24 25 22 26   GLUCOSE 130*  < > 144* 152* 153* 209* 160*  BUN 62*  < > 61* 61* 64* 60* 61*  CREATININE 5.79*  < > 5.90* 5.44* 5.73* 5.27* 5.21*  CALCIUM 8.3*  < > 8.6* 8.3* 8.3* 8.7* 8.9  PHOS 3.2  --   --   --  4.4  --  3.9  < > = values in this interval not displayed.  Liver Function Tests:  Recent Labs Lab 07/14/16 0447 07/15/16 0433 07/17/16 0357 07/19/16 0407  AST  --  30  --   --   ALT  --  31  --   --   ALKPHOS  --  48  --   --   BILITOT  --  0.5  --   --    PROT  --  5.7*  --   --   ALBUMIN 3.1* 3.1* 3.3* 3.3*   No results for input(s): LIPASE, AMYLASE in the last 168 hours. No results for input(s): AMMONIA in the last 168 hours.  CBC:  Recent Labs Lab 07/13/16 1223 07/15/16 0433 07/16/16 0316  WBC 6.0 4.4 5.4  NEUTROABS  --  3.0  --   HGB 11.3* 9.7* 9.6*  HCT 33.5* 28.9* 28.0*  MCV 90.2 90.3 91.4  PLT 121* 114* 122*    Cardiac Enzymes: No results for input(s): CKTOTAL, CKMB, CKMBINDEX, TROPONINI in the last 168 hours.  BNP: Invalid input(s): POCBNP  CBG:  Recent Labs Lab 07/18/16 1146 07/18/16 1656 07/18/16 2116 07/19/16 0738 07/19/16 1117  GLUCAP 180* 180* 137* 126* 23*    Microbiology: Results for orders placed or performed during the hospital encounter of 07/08/16  Urine culture     Status: None   Collection Time: 07/08/16 12:54 PM  Result Value Ref Range Status   Specimen Description URINE, CLEAN CATCH  Final   Special Requests NONE  Final  Culture   Final    NO GROWTH Performed at [facility], [place] [address], [place], [place] [address]    Report Status 07/09/2016 FINAL  Final    Coagulation Studies: No results for input(s): LABPROT, INR in the last 72 hours.  Urinalysis: No results for input(s): COLORURINE, LABSPEC, PHURINE, GLUCOSEU, HGBUR, BILIRUBINUR, KETONESUR, PROTEINUR, UROBILINOGEN, NITRITE, LEUKOCYTESUR in the last 72 hours.  Invalid input(s): APPERANCEUR    Imaging: No results found.   Medications:    . amLODipine  5 mg Oral Daily  . atorvastatin  10 mg Oral Daily  . dextrose  25 g Intravenous Once  . famotidine  20 mg Oral Daily  . feeding supplement (GLUCERNA SHAKE)  237 mL Oral BID BM  . gabapentin  300 mg Oral QHS  . [START ON 07/20/2016] glipiZIDE  2.5 mg Oral QAC breakfast  . insulin aspart  0-5 Units Subcutaneous QHS  . insulin aspart  0-9 Units Subcutaneous TID WC  . methylPREDNISolone (SOLU-MEDROL) injection  60 mg Intravenous Daily  . PARoxetine  25 mg Oral  Daily   acetaminophen **OR** acetaminophen, ALPRAZolam, labetalol, meclizine, ondansetron  Assessment/ Plan:  [name] is a 76 y.o. white female with insulin dependent diabetes mellitus type II, hypertension, hyperlipidemia, depression, anxiety, GERD, who was admitted to [facility] on 07/08/2016    1. Acute renal failure on chronic kidney disease stage III: bland urine on urinalysis. Baseline creatinine of 1.4, GFR of 35 02/2016. Chronic kidney disease secondary to diabetic nephropathy. Renal biopsy done and shows acute interstitial nephritis as well as acute tubular necrosis.  Acute interstitial nephritis could be secondary to omeprazole or sucralfate. -  Increased renal echogenicity on renal US.   - SPEP/UPEP, ANA, anti-GBM, ANCA are negative. Urine P/C ratio is 0.26.  - renal biopsy results noted.  To treat the underlying interstitial nephritis component we will start the patient on Solu-Medrol 60 mg IV today as well as tomorrow.  We will then likely place her on prednisone 30 mg by mouth daily to take at home.   2. Hypertension:  blood pressure currently 125/54.  Continue amlodipine.  3. Diabetes mellitus type II with chronic kidney disease:  insulin dependent. Hemoglobin A1c 7.1%      LOS: 11 Rhonin Trott 3/13/20182:14 PM Results for Kelly, [name] (MRN [id number]) as of 07/16/2016 11:36  Ref. Range 07/09/2016 14:16 07/09/2016 14:16  Anit Nuclear Antibody(ANA) Latest Ref Range: Negative  Negative   ANCA Proteinase 3 Latest Ref Range: 0.0 - 3.5 U/mL <3.5 <3.5  GBM Ab Latest Ref Range: 0 - 20 units 2   Myeloperoxidase Abs Latest Ref Range: 0.0 - 9.0 U/mL <9.0 <9.0    Ref. Range 07/09/2016 14:16  M-SPIKE, % Latest Ref Range: Not Observed g/dL Not Observed    Ref. Range 07/11/2016 12:50  Protein Creatinine Ratio Latest Ref Range: 0.00 - 0.15 mg/mgCre 0.26 (H)

## 2016-07-19 NOTE — Progress Notes (Signed)
Nutrition Brief Note  Patient identified to be seen for Length of Stay.  Wt Readings from Last 15 Encounters:  07/08/16 140 lb (63.5 kg)  03/23/16 140 lb (63.5 kg)  06/21/15 140 lb (63.5 kg)   76 y.o. white femalewith insulin dependent diabetes mellitus type II, hypertension, hyperlipidemia, depression, anxiety, GERD found to have ARF on CKD III due to acute interstitial nephritis and acute tubular necrosis.   Spoke with patient at bedside. She reports her appetite is good now and was also good PTA. She reports eating 100% of 3 meals daily. Patient reports MD ordered her Glucerna to drink this admission and she has been drinking them, but reports she does not need them at home to maintain her weight. Patient denies any weight loss. Patient reports she already follows a carbohydrate modified diet for diabetes and does not have any questions about that diet. Per patient request reviewed low sodium diet guidelines for hypertension and to preserve kidney function.   Nutrition-Focused physical exam completed. Findings are no fat depletion, no muscle depletion, and no edema.   Body mass index is 24.03 kg/m. Patient meets criteria for normal weight based on current BMI.   Current diet order is renal, patient is consuming approximately 100% of meals at this time. Labs and medications reviewed.   No nutrition interventions warranted at this time. If nutrition issues arise, please consult RD.   Willey Blade, MS, RD, LDN Pager: [phone] After Hours Pager: [phone]

## 2016-07-19 NOTE — Progress Notes (Signed)
[place] [facility] [place] NAME: Siria Calandro    MR#:  [id number]  DATE OF BIRTH:  1940-09-17  CHIEF COMPLAINT:   Chief Complaint  Patient presents with  . Abnormal Lab  . Flank Pain  Feeling better denies any flank pain nausea or vomiting  REVIEW OF SYSTEMS:    Review of Systems  Constitutional: Negative for chills and fever.  HENT: Negative for hearing loss.   Eyes: Negative for blurred vision, double vision and photophobia.  Respiratory: Negative for cough, hemoptysis and shortness of breath.   Cardiovascular: Negative for palpitations, orthopnea and leg swelling.  Gastrointestinal: Negative for abdominal pain, diarrhea and vomiting.  Genitourinary: Negative for dysuria and urgency.  Musculoskeletal: Negative for myalgias and neck pain.  Skin: Negative for rash.  Neurological: Negative for dizziness, focal weakness, seizures, weakness and headaches.  Psychiatric/Behavioral: Negative for memory loss. The patient does not have insomnia.     Nutrition:  Tolerating Diet: Tolerating PT:    DRUG ALLERGIES:   Allergies  Allergen Reactions  . Amoxicillin   . Morphine And Related     VITALS:  Blood pressure (!) 125/54, pulse 80, temperature 98.2 F (36.8 C), temperature source Oral, resp. rate 18, height 5\' 4"  (1.626 m), weight 140 lb (63.5 kg), SpO2 94 %.  PHYSICAL EXAMINATION:   Physical Exam  GENERAL:  76 y.o.-year-old patient lying in the bed with no acute distress.  EYES: Pupils equal, round, reactive to light and accommodation. No scleral icterus. Extraocular muscles intact.  HEENT: Head atraumatic, normocephalic. Oropharynx and nasopharynx clear.  NECK:  Supple, no jugular venous distention. No thyroid enlargement, no tenderness.  LUNGS: Normal breath sounds bilaterally, no wheezing, rales,rhonchi or crepitation. No use of accessory muscles of respiration.  CARDIOVASCULAR: S1, S2 normal. No murmurs, rubs, or  gallops.  ABDOMEN: Soft, nontender, nondistended. Bowel sounds present. No organomegaly or mass.  EXTREMITIES: No pedal edema, cyanosis, or clubbing.  NEUROLOGIC: Cranial nerves II through XII are intact. Muscle strength 5/5 in all extremities. Sensation intact. Gait not checked.  PSYCHIATRIC: The patient is alert and oriented x 3.  SKIN: No obvious rash, lesion, or ulcer.    LABORATORY PANEL:   CBC  Recent Labs Lab 07/16/16 0316  WBC 5.4  HGB 9.6*  HCT 28.0*  PLT 122*   ------------------------------------------------------------------------------------------------------------------  Chemistries   Recent Labs Lab 07/15/16 0433  07/19/16 0407  NA 141  < > 141  K 5.0  < > 4.8  CL 110  < > 106  CO2 24  < > 26  GLUCOSE 144*  < > 160*  BUN 61*  < > 61*  CREATININE 5.90*  < > 5.21*  CALCIUM 8.6*  < > 8.9  AST 30  --   --   ALT 31  --   --   ALKPHOS 48  --   --   BILITOT 0.5  --   --   < > = values in this interval not displayed. ------------------------------------------------------------------------------------------------------------------  Cardiac Enzymes No results for input(s): TROPONINI in the last 168 hours. ------------------------------------------------------------------------------------------------------------------  RADIOLOGY:  No results found.   ASSESSMENT AND PLAN:   Active Problems:   AKI (acute kidney injury) ([place])  1. acute right flank pain with acute renal failure I have discussed the case with pathologists who is reviewing her biopsy results at [facility] Dr. Candiss Norse. They report that the specimen was small. She feels that findings were consistent with ATN as well as possible  interstitial nephritis. I have discussed this with Dr. Holley Raring he will start the patient on prednisone therapy today we'll repeat BMP in the morning  2. diabetes mellitus type 2: Hypoglycemia resolved..continue SSI with coverage blood sugar now slightly elevated we'll restart  lower dose glipizide  3. essential hypertension: bp stable  4. GERD: Continue PPI but the sucralfate is stopped  5, diarrhea resolved.  I discussed the case with nephrology and pathologist   All the records are reviewed and case discussed with Care Management/Social Workerr. Management plans discussed with the patient, family and they are in agreement.  CODE STATUS:full  TOTAL TIME TAKING CARE OF THIS PATIENT: 32 minutes.   POSSIBLE D/C IN 1-2 DAYS, DEPENDING ON CLINICAL CONDITION.   Dustin Flock M.D on 07/19/2016 at 12:55 PM  Between 7am to 6pm - Pager - [phone]  After 6pm go to [email or web address] - password [email or web address] [place]  Office  [phone]  CC: Primary care physician; Albina Billet, MD

## 2016-07-20 LAB — BASIC METABOLIC PANEL
Anion gap: 8 (ref 5–15)
BUN: 68 mg/dL — ABNORMAL HIGH (ref 6–20)
CO2: 23 mmol/L (ref 22–32)
Calcium: 9 mg/dL (ref 8.9–10.3)
Chloride: 107 mmol/L (ref 101–111)
Creatinine, Ser: 5.03 mg/dL — ABNORMAL HIGH (ref 0.44–1.00)
GFR calc Af Amer: 9 mL/min — ABNORMAL LOW (ref >60)
GFR calc non Af Amer: 8 mL/min — ABNORMAL LOW (ref >60)
Glucose, Bld: 252 mg/dL — ABNORMAL HIGH (ref 65–99)
Potassium: 5.1 mmol/L (ref 3.5–5.1)
Sodium: 138 mmol/L (ref 135–145)

## 2016-07-20 LAB — GLUCOSE, CAPILLARY: Glucose-Capillary: 205 mg/dL — ABNORMAL HIGH (ref 65–99)

## 2016-07-20 MED ORDER — PREDNISONE 10 MG PO TABS: 30.0000 mg | ORAL_TABLET | Freq: Every day | ORAL | 0 refills | Status: DC

## 2016-07-20 NOTE — Care Management Important Message (Signed)
Important Message  Patient Details  Name: Kelly [name] MRN: [id number] Date of Birth: Sep 21, 1940   Medicare Important Message Given:  Yes    Beverly Sessions, RN 07/20/2016, 9:52 AM

## 2016-07-20 NOTE — Progress Notes (Signed)
[facility]  ROUNDING NOTE   Subjective:  Pt seen at bedside.  Doing well. Got second dose of solumedrol.   Cr down to 5.03.     Objective:  Vital signs in last 24 hours:  Temp:  [97.5 F (36.4 C)-98.3 F (36.8 C)] 97.5 F (36.4 C) (03/14 0508) Pulse Rate:  [76-85] 82 (03/14 0933) Resp:  [17-19] 17 (03/14 0508) BP: (117-138)/(54-68) 138/63 (03/14 0933) SpO2:  [94 %-95 %] 94 % (03/14 0508)  Weight change:  Filed Weights   07/08/16 1247 07/08/16 1252  Weight: 63.5 kg (140 lb) 63.5 kg (140 lb)    Intake/Output: I/O last 3 completed shifts: In: 600 [P.O.:600] Out: 2900 [Urine:2900]   Intake/Output this shift:  Total I/O In: 120 [P.O.:120] Out: 700 [Urine:700]  Physical Exam: General: NAD, standing up  Head: Normocephalic, atraumatic. Moist oral mucosal membranes  Eyes: Anicteric  Neck: Supple, trachea midline  Lungs:  Clear to auscultation, normal effort  Heart: Regular rate and rhythm  Abdomen:  Soft, nontender, bowel sounds prseent  Extremities: no peripheral edema.  Neurologic: Nonfocal, moving all four extremities  Skin: No rash       Basic Metabolic Panel:  Recent Labs Lab 07/14/16 0447  07/16/16 0316 07/17/16 0357 07/18/16 1001 07/19/16 0407 07/20/16 0524  NA 136  < > 142 141 141 141 138  K 6.3*  < > 4.9 4.5 4.5 4.8 5.1  CL 109  < > 109 109 109 106 107  CO2 21*  < > 24 25 22 26 23   GLUCOSE 130*  < > 152* 153* 209* 160* 252*  BUN 62*  < > 61* 64* 60* 61* 68*  CREATININE 5.79*  < > 5.44* 5.73* 5.27* 5.21* 5.03*  CALCIUM 8.3*  < > 8.3* 8.3* 8.7* 8.9 9.0  PHOS 3.2  --   --  4.4  --  3.9  --   < > = values in this interval not displayed.  Liver Function Tests:  Recent Labs Lab 07/14/16 0447 07/15/16 0433 07/17/16 0357 07/19/16 0407  AST  --  30  --   --   ALT  --  31  --   --   ALKPHOS  --  48  --   --   BILITOT  --  0.5  --   --   PROT  --  5.7*  --   --   ALBUMIN 3.1* 3.1* 3.3* 3.3*   No results for input(s): LIPASE,  AMYLASE in the last 168 hours. No results for input(s): AMMONIA in the last 168 hours.  CBC:  Recent Labs Lab 07/13/16 1223 07/15/16 0433 07/16/16 0316  WBC 6.0 4.4 5.4  NEUTROABS  --  3.0  --   HGB 11.3* 9.7* 9.6*  HCT 33.5* 28.9* 28.0*  MCV 90.2 90.3 91.4  PLT 121* 114* 122*    Cardiac Enzymes: No results for input(s): CKTOTAL, CKMB, CKMBINDEX, TROPONINI in the last 168 hours.  BNP: Invalid input(s): POCBNP  CBG:  Recent Labs Lab 07/19/16 0738 07/19/16 1117 07/19/16 1630 07/19/16 2117 07/20/16 0801  GLUCAP 126* 238* 125* 321* 205*    Microbiology: Results for orders placed or performed during the hospital encounter of 07/08/16  Urine culture     Status: None   Collection Time: 07/08/16 12:54 PM  Result Value Ref Range Status   Specimen Description URINE, CLEAN CATCH  Final   Special Requests NONE  Final   Culture   Final    NO GROWTH Performed  at [facility], [place] [address], [place], [place] [address]    Report Status 07/09/2016 FINAL  Final    Coagulation Studies: No results for input(s): LABPROT, INR in the last 72 hours.  Urinalysis: No results for input(s): COLORURINE, LABSPEC, PHURINE, GLUCOSEU, HGBUR, BILIRUBINUR, KETONESUR, PROTEINUR, UROBILINOGEN, NITRITE, LEUKOCYTESUR in the last 72 hours.  Invalid input(s): APPERANCEUR    Imaging: No results found.   Medications:    . amLODipine  5 mg Oral Daily  . atorvastatin  10 mg Oral Daily  . dextrose  25 g Intravenous Once  . feeding supplement (GLUCERNA SHAKE)  237 mL Oral BID BM  . gabapentin  300 mg Oral QHS  . glipiZIDE  2.5 mg Oral QAC breakfast  . insulin aspart  0-5 Units Subcutaneous QHS  . insulin aspart  0-9 Units Subcutaneous TID WC  . PARoxetine  25 mg Oral Daily   acetaminophen **OR** acetaminophen, ALPRAZolam, labetalol, meclizine, ondansetron  Assessment/ Plan:  [name] is a 76 y.o. white female with insulin dependent diabetes mellitus type II,  hypertension, hyperlipidemia, depression, anxiety, GERD, who was admitted to [facility] on 07/08/2016    1. Acute renal failure on chronic kidney disease stage III: bland urine on urinalysis. Baseline creatinine of 1.4, GFR of 35 02/2016. Chronic kidney disease secondary to diabetic nephropathy. Renal biopsy done and shows acute interstitial nephritis as well as acute tubular necrosis.  Acute interstitial nephritis could be secondary to omeprazole or sucralfate. -  Increased renal echogenicity on renal US.   - SPEP/UPEP, ANA, anti-GBM, ANCA are negative. Urine P/C ratio is 0.26.  - Pt doing well, received two doses of solumedrol.  Will then start prednisone 30mg  po daily for 3-4 weeks and then taper.  Has an outpatient appointment with Dr. Juleen China later this week.   Advised the patient to no longer take sucralfate, omeprazole, or any other PPI.  Patient also advised to take vitamin D 2000 IU daily.   2. Hypertension:  blood pressure 138/63.  Continue amlodipine.    3. Diabetes mellitus type II with chronic kidney disease:  insulin dependent. Hemoglobin A1c 7.1%.  Watch blood sugars while on prednisone.       LOS: 12 Kelly [name] 3/14/201811:12 AM Results for Kelly, [name] (MRN [id number]) as of 07/16/2016 11:36  Ref. Range 07/09/2016 14:16 07/09/2016 14:16  Anit Nuclear Antibody(ANA) Latest Ref Range: Negative  Negative   ANCA Proteinase 3 Latest Ref Range: 0.0 - 3.5 U/mL <3.5 <3.5  GBM Ab Latest Ref Range: 0 - 20 units 2   Myeloperoxidase Abs Latest Ref Range: 0.0 - 9.0 U/mL <9.0 <9.0    Ref. Range 07/09/2016 14:16  M-SPIKE, % Latest Ref Range: Not Observed g/dL Not Observed    Ref. Range 07/11/2016 12:50  Protein Creatinine Ratio Latest Ref Range: 0.00 - 0.15 mg/mgCre 0.26 (H)

## 2016-07-20 NOTE — Discharge Summary (Signed)
[facility] [place], 76 y.o., DOB 1940-12-14, MRN [id number]. Admission date: 07/08/2016 Discharge Date 07/20/2016 Primary MD Albina Billet, MD Admitting Physician Nicholes Mango, MD  Admission Diagnosis  Right flank pain [R10.9] Acute renal failure, unspecified acute renal failure type [facility]) [N17.9]  Discharge Diagnosis   Active Problems:   AKI (acute kidney injury) ([place]) on chronic kidney disease stage III   Diabetes type 2 essentail hypertension GERD Diarrhea     Hospital Course patient 76 year old known history of diabetes, hypertension chronic kidney disease stage III and kidney stones presented to the emergency room with complaint of 4 days history of right flank pain. Patient's outpatient labs revealed elevated BUN of 81 and creatinine of 7.26. She was admitted to the hospital for further workup. It was felt that this was due to acute tubular necrosis. She was hydrated. She underwent a renal biopsy. The pathologist stated that he was tissue sample was small. But however they could see findings consistent with ATN as well as acute interstitial nephritis. Nephrology saw the patient. They felt that the Prilosec that she is on could be causing this. Patient is started on prednisone therapy. Her renal function and GFR have improved at discharge. She'll need to continue prednisone therapy and outpatient follow-up with nephrology this Friday. At that time repeat renal function evaluation needs to be done again.             Consults  nephrology  Significant Tests:  See full reports for all details     Dg Chest 1 View  Result Date: 07/14/2016 CLINICAL DATA:  Hypoxia EXAM: CHEST 1 VIEW COMPARISON:  Chest x-ray of 06/16/2006 FINDINGS: There are somewhat prominent interstitial markings with tiny pleural effusions most consistent with mild interstitial edema. Mild cardiomegaly is present. No bony abnormality is seen. Prominence  paramediastinal soft tissues probably reflect ectatic great vessels. IMPRESSION: Cardiomegaly and mild interstitial edema with small effusions. Electronically Signed   By: Ivar Drape M.D.   On: 07/14/2016 08:41   US Renal  Result Date: 07/09/2016 CLINICAL DATA:  Acute renal failure. EXAM: RENAL / URINARY TRACT ULTRASOUND COMPLETE COMPARISON:  CT 07/08/2016 FINDINGS: Right Kidney: Length: 9.8 cm. Increased echogenicity throughout the right renal cortex. No significant cortical thinning. Negative for hydronephrosis. No suspicious mass lesion. Left Kidney: Length: 8.7 cm. 0.4 cm echogenic focus in the left kidney upper pole region. This is compatible with a nonobstructive small stone. Similar finding was present on the recent CT. Increased echogenicity in the left kidney with mild cortical thinning. Negative for hydronephrosis. No suspicious renal lesion. Bladder: Appears normal for degree of bladder distention. IMPRESSION: Both kidneys demonstrate increased echogenicity. Findings could represent chronic medical renal disease. Negative for hydronephrosis. Nonobstructive small stone in left kidney. Electronically Signed   By: Markus Daft M.D.   On: 07/09/2016 11:42   US Biopsy-no Radiologist  Result Date: 07/15/2016 CLINICAL DATA:  Right renal core biopsy.  Acute renal failure. EXAM: ULTRASOUND GUIDED CORE NEEDLE BIOPSY OF RIGHT KIDNEY COMPARISON:  None FINDINGS: Ultrasound-guided core biopsy performed of the right kidney by Dr. Candiss Norse without a radiologist present. Five core biopsies of the lower pole of the right kidney were performed. Postprocedural ultrasound evaluation of the right kidney demonstrates a small perinephric hematoma around the lower pole measuring approximately 2.9 x 1.7 cm. No obstructive uropathy. IMPRESSION: 1. Ultrasound-guided core biopsy performed of the right kidney by Dr. Candiss Norse without a radiologist present. 2. Small right perinephric hematoma. Electronically  Signed   By: Kathreen Devoid    On: 07/15/2016 12:05   Ct Renal Stone Study  Result Date: 07/08/2016 CLINICAL DATA:  Right flank pain for 4 days, status post cholecystectomy. Status post appendectomy EXAM: CT ABDOMEN AND PELVIS WITHOUT CONTRAST TECHNIQUE: Multidetector CT imaging of the abdomen and pelvis was performed following the standard protocol without IV contrast. COMPARISON:  03/07/2016. FINDINGS: Lower chest: Lung bases shows no acute findings Hepatobiliary: No focal liver abnormality is seen. Status post cholecystectomy. No biliary dilatation. Pancreas: Unremarkable. No pancreatic ductal dilatation or surrounding inflammatory changes. Spleen: Normal in size without focal abnormality. Adrenals/Urinary Tract: No adrenal gland mass. There is a punctate nonobstructive calcification in upper pole of the left kidney measures 2.4 mm. No hydronephrosis or hydroureter. No calcified ureteral calculi. No calcified calculi are noted within urinary bladder. No thickening of urinary bladder wall. Stomach/Bowel: No small bowel obstruction. No thickened or dilated small bowel loops. There is a low lying cecum. No pericecal inflammation. The patient is status post appendectomy. Some colonic stool and gas noted within transverse colon. Some colonic stool noted within descending colon and sigmoid colon. Scattered diverticula are noted sigmoid colon and descending colon. No evidence of acute colitis or diverticulitis. No distal colonic obstruction. Vascular/Lymphatic: Mild atherosclerotic calcifications of abdominal aorta. No aortic aneurysm. No adenopathy. Reproductive: The uterus is atrophic.  No adnexal mass. Other: There is no evidence of ascites or free abdominal air. There is mild stranding of subcutaneous fat in anterior abdominal wall just right and left adjacent to umbilical region. This is stable from prior exam. Musculoskeletal: No destructive bony lesions are noted. There are degenerative changes lumbar spine with significant disc space  flattening with endplate sclerotic changes and mild spinal canal stenosis at L2-L3 level. Stable about 9 mm anterolisthesis L5 on S1 vertebral body. Significant disc space flattening with vacuum disc phenomenon at L5-S1 level. 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 IMPRESSION: 1. Status post cholecystectomy. 2. There is left nonobstructive nephrolithiasis. No hydronephrosis or hydroureter. No calcified ureteral calculi. 3. No calcified calculi are noted within urinary bladder. 4. There is a low lying cecum. No pericecal inflammation. Status post appendectomy. No small bowel or colonic obstruction. 5. Moderate stool are noted descending colon and proximal sigmoid colon. Colonic diverticula are noted descending colon and sigmoid colon. No evidence of acute colitis or diverticulitis. 6. Stable degenerative changes lumbar spine most significant at L2-L3 and L5-S1 level. Stable anterolisthesis about 1 cm L5 on S1 vertebral body. Electronically Signed   By: Lahoma Crocker M.D.   On: 07/08/2016 14:58       Today   Subjective:   Kelly [name]  patient feeling better Objective:   Blood pressure 138/63, pulse 82, temperature 97.5 F (36.4 C), temperature source Oral, resp. rate 17, height 5\' 4"  (1.626 m), weight 140 lb (63.5 kg), SpO2 94 %.  .  Intake/Output Summary (Last 24 hours) at 07/20/16 1338 Last data filed at 07/20/16 1026  Gross per 24 hour  Intake              600 ml  Output             2350 ml  Net            -1750 ml    Exam VITAL SIGNS: Blood pressure 138/63, pulse 82, temperature 97.5 F (36.4 C), temperature source Oral, resp. rate 17, height 5\' 4"  (1.626 m), weight 140 lb (63.5 kg), SpO2 94 %.  GENERAL:  76 y.o.-year-old patient lying in the bed with no acute distress.  EYES: Pupils equal, round, reactive to light and accommodation. No scleral icterus. Extraocular muscles intact.  HEENT: Head atraumatic, normocephalic. Oropharynx and nasopharynx clear.  NECK:  Supple, no  jugular venous distention. No thyroid enlargement, no tenderness.  LUNGS: Normal breath sounds bilaterally, no wheezing, rales,rhonchi or crepitation. No use of accessory muscles of respiration.  CARDIOVASCULAR: S1, S2 normal. No murmurs, rubs, or gallops.  ABDOMEN: Soft, nontender, nondistended. Bowel sounds present. No organomegaly or mass.  EXTREMITIES: No pedal edema, cyanosis, or clubbing.  NEUROLOGIC: Cranial nerves II through XII are intact. Muscle strength 5/5 in all extremities. Sensation intact. Gait not checked.  PSYCHIATRIC: The patient is alert and oriented x 3.  SKIN: No obvious rash, lesion, or ulcer.   Data Review     CBC w Diff: Lab Results  Component Value Date   WBC 5.4 07/16/2016   HGB 9.6 (L) 07/16/2016   HGB 13.1 01/18/2013   HCT 28.0 (L) 07/16/2016   HCT 38.6 01/18/2013   PLT 122 (L) 07/16/2016   PLT 170 01/18/2013   LYMPHOPCT 22 07/15/2016   MONOPCT 6 07/15/2016   EOSPCT 3 07/15/2016   BASOPCT 1 07/15/2016   CMP: Lab Results  Component Value Date   NA 138 07/20/2016   NA 137 01/18/2013   K 5.1 07/20/2016   K 3.9 01/18/2013   CL 107 07/20/2016   CL 103 01/18/2013   CO2 23 07/20/2016   CO2 26 01/18/2013   BUN 68 (H) 07/20/2016   BUN 14 01/18/2013   CREATININE 5.03 (H) 07/20/2016   CREATININE 1.48 (H) 01/18/2013   PROT 5.7 (L) 07/15/2016   PROT 6.2 (L) 01/18/2013   ALBUMIN 3.3 (L) 07/19/2016   ALBUMIN 3.3 (L) 01/18/2013   BILITOT 0.5 07/15/2016   BILITOT 0.6 01/18/2013   ALKPHOS 48 07/15/2016   ALKPHOS 120 01/18/2013   AST 30 07/15/2016   AST 22 01/18/2013   ALT 31 07/15/2016   ALT 46 01/18/2013  .  Micro Results No results found for this or any previous visit (from the past 240 hour(s)).      Code Status Orders        Start     Ordered   07/08/16 1709  Full code  Continuous     07/08/16 1708    Code Status History    Date Active Date Inactive Code Status Order ID Comments User Context   This patient has a current code status  but no historical code status.    Advance Directive Documentation   Flowsheet Row Most Recent Value  Type of Advance Directive  Healthcare Power of Attorney  Pre-existing out of facility DNR order (yellow form or pink MOST form)  No data  "MOST" Form in Place?  No data          Follow-up Information    Lavonia Dana, MD Follow up on 07/21/2016.   Specialty:  Internal Medicine Why:  @ 9:30 am.  Contact information: [address] [place] [address] [phone]        Albina Billet, MD. Daphane Shepherd on 08/01/2016.   Specialty:  Internal Medicine Why:  Monday at 3:45pm for hospital follow-up Contact information: [address] [place] [address] [phone]           Discharge Medications   Allergies as of 07/20/2016      Reactions   Amoxicillin    Morphine  And Related    Prilosec Otc [omeprazole Magnesium] Other (See Comments)   Maybe cause of acute interstitial nephritis   Sucralfate Other (See Comments)   Maybe cause of acute interstitial nephritis      Medication List    STOP taking these medications   enalapril 20 MG tablet Commonly known as:  VASOTEC   hydrochlorothiazide 25 MG tablet Commonly known as:  HYDRODIURIL   omeprazole 20 MG capsule Commonly known as:  PRILOSEC   sucralfate 1 g tablet Commonly known as:  CARAFATE   VICTOZA 18 MG/3ML Sopn Generic drug:  liraglutide     TAKE these medications   ALPRAZolam 0.5 MG tablet Commonly known as:  XANAX Take 0.5 mg by mouth 2 (two) times daily as needed.   amLODipine 5 MG tablet Commonly known as:  NORVASC Take 5 mg by mouth daily.   aspirin EC 81 MG tablet Take 81 mg by mouth daily.   BASAGLAR KWIKPEN 100 UNIT/ML Sopn Inject 16 Units into the skin at bedtime.   calcium citrate-vitamin D 315-200 MG-UNIT tablet Commonly known as:  CITRACAL+D Take 1 tablet by mouth 2 (two) times daily.   gabapentin 300 MG capsule Commonly known as:  NEURONTIN Take 300 mg  by mouth at bedtime.   glipiZIDE 10 MG 24 hr tablet Commonly known as:  GLUCOTROL XL Take 10 mg by mouth 2 (two) times daily.   LIPITOR 10 MG tablet Generic drug:  atorvastatin Take 10 mg by mouth daily.   meclizine 12.5 MG tablet Commonly known as:  ANTIVERT Take 12.5 mg by mouth 3 (three) times daily as needed for dizziness.   ondansetron 4 MG disintegrating tablet Commonly known as:  ZOFRAN-ODT Take 4 mg by mouth every 8 (eight) hours as needed for nausea or vomiting.   PARoxetine 25 MG 24 hr tablet Commonly known as:  PAXIL-CR Take 25 mg by mouth daily.   predniSONE 10 MG tablet Commonly known as:  DELTASONE Take 3 tablets (30 mg total) by mouth daily with breakfast.          Total Time in preparing paper work, data evaluation and todays exam - 35 minutes  Dustin Flock M.D on 07/20/2016 at 1:38 PM  [facility]   Office  [phone]

## 2016-07-20 NOTE — Progress Notes (Signed)
MD ordered patient to be discharged home.  Discharge instructions were reviewed with the patient and she voiced understanding.  Follow-up appointments were made.  Prescriptions sent to patients pharmacy.  IV was removed with catheter intact.  All patients questions were answered.  Patient waiting on her ride to get here.

## 2016-07-22 ENCOUNTER — Encounter: Payer: Self-pay | Admitting: Nephrology

## 2016-07-25 DIAGNOSIS — I129 Hypertensive chronic kidney disease with stage 1 through stage 4 chronic kidney disease, or unspecified chronic kidney disease: Secondary | ICD-10-CM | POA: Diagnosis not present

## 2016-07-25 DIAGNOSIS — N179 Acute kidney failure, unspecified: Secondary | ICD-10-CM | POA: Diagnosis not present

## 2016-07-25 DIAGNOSIS — E1129 Type 2 diabetes mellitus with other diabetic kidney complication: Secondary | ICD-10-CM | POA: Diagnosis not present

## 2016-07-25 DIAGNOSIS — N10 Acute tubulo-interstitial nephritis: Secondary | ICD-10-CM | POA: Diagnosis not present

## 2016-08-01 DIAGNOSIS — E785 Hyperlipidemia, unspecified: Secondary | ICD-10-CM | POA: Diagnosis not present

## 2016-08-01 DIAGNOSIS — I10 Essential (primary) hypertension: Secondary | ICD-10-CM | POA: Diagnosis not present

## 2016-08-01 DIAGNOSIS — E119 Type 2 diabetes mellitus without complications: Secondary | ICD-10-CM | POA: Diagnosis not present

## 2016-08-09 DIAGNOSIS — I129 Hypertensive chronic kidney disease with stage 1 through stage 4 chronic kidney disease, or unspecified chronic kidney disease: Secondary | ICD-10-CM | POA: Diagnosis not present

## 2016-08-09 DIAGNOSIS — N179 Acute kidney failure, unspecified: Secondary | ICD-10-CM | POA: Diagnosis not present

## 2016-08-09 DIAGNOSIS — N10 Acute tubulo-interstitial nephritis: Secondary | ICD-10-CM | POA: Diagnosis not present

## 2016-08-09 DIAGNOSIS — E1129 Type 2 diabetes mellitus with other diabetic kidney complication: Secondary | ICD-10-CM | POA: Diagnosis not present

## 2016-08-22 DIAGNOSIS — N179 Acute kidney failure, unspecified: Secondary | ICD-10-CM | POA: Diagnosis not present

## 2016-08-22 DIAGNOSIS — I129 Hypertensive chronic kidney disease with stage 1 through stage 4 chronic kidney disease, or unspecified chronic kidney disease: Secondary | ICD-10-CM | POA: Diagnosis not present

## 2016-08-22 DIAGNOSIS — E1129 Type 2 diabetes mellitus with other diabetic kidney complication: Secondary | ICD-10-CM | POA: Diagnosis not present

## 2016-08-25 ENCOUNTER — Ambulatory Visit: Payer: Medicare Other | Admitting: Family Medicine

## 2016-08-26 ENCOUNTER — Ambulatory Visit (INDEPENDENT_AMBULATORY_CARE_PROVIDER_SITE_OTHER): Payer: Medicare Other | Admitting: Family Medicine

## 2016-08-26 ENCOUNTER — Ambulatory Visit (INDEPENDENT_AMBULATORY_CARE_PROVIDER_SITE_OTHER): Payer: Medicare Other

## 2016-08-26 VITALS — BP 142/68 | HR 88 | Temp 98.2°F | Ht 64.0 in | Wt 155.2 lb

## 2016-08-26 DIAGNOSIS — D631 Anemia in chronic kidney disease: Secondary | ICD-10-CM

## 2016-08-26 DIAGNOSIS — E1129 Type 2 diabetes mellitus with other diabetic kidney complication: Secondary | ICD-10-CM | POA: Insufficient documentation

## 2016-08-26 DIAGNOSIS — E785 Hyperlipidemia, unspecified: Secondary | ICD-10-CM

## 2016-08-26 DIAGNOSIS — Z794 Long term (current) use of insulin: Secondary | ICD-10-CM | POA: Diagnosis not present

## 2016-08-26 DIAGNOSIS — R0602 Shortness of breath: Secondary | ICD-10-CM

## 2016-08-26 DIAGNOSIS — J811 Chronic pulmonary edema: Secondary | ICD-10-CM | POA: Diagnosis not present

## 2016-08-26 DIAGNOSIS — I10 Essential (primary) hypertension: Secondary | ICD-10-CM

## 2016-08-26 DIAGNOSIS — E1122 Type 2 diabetes mellitus with diabetic chronic kidney disease: Secondary | ICD-10-CM

## 2016-08-26 DIAGNOSIS — Z9989 Dependence on other enabling machines and devices: Secondary | ICD-10-CM

## 2016-08-26 DIAGNOSIS — N184 Chronic kidney disease, stage 4 (severe): Secondary | ICD-10-CM | POA: Insufficient documentation

## 2016-08-26 DIAGNOSIS — G4733 Obstructive sleep apnea (adult) (pediatric): Secondary | ICD-10-CM | POA: Insufficient documentation

## 2016-08-26 LAB — CBC
HCT: 30.1 % — ABNORMAL LOW (ref 36.0–46.0)
Hemoglobin: 9.9 g/dL — ABNORMAL LOW (ref 12.0–15.0)
MCHC: 32.8 g/dL (ref 30.0–36.0)
MCV: 95.7 fl (ref 78.0–100.0)
Platelets: 186 10*3/uL (ref 150.0–400.0)
RBC: 3.14 Mil/uL — ABNORMAL LOW (ref 3.87–5.11)
RDW: 15.6 % — ABNORMAL HIGH (ref 11.5–15.5)
WBC: 7.2 10*3/uL (ref 4.0–10.5)

## 2016-08-26 LAB — COMPREHENSIVE METABOLIC PANEL
ALT: 102 U/L — ABNORMAL HIGH (ref 0–35)
AST: 31 U/L (ref 0–37)
Albumin: 3.3 g/dL — ABNORMAL LOW (ref 3.5–5.2)
Alkaline Phosphatase: 70 U/L (ref 39–117)
BUN: 38 mg/dL — ABNORMAL HIGH (ref 6–23)
CO2: 34 mEq/L — ABNORMAL HIGH (ref 19–32)
Calcium: 8.8 mg/dL (ref 8.4–10.5)
Chloride: 101 mEq/L (ref 96–112)
Creatinine, Ser: 2.27 mg/dL — ABNORMAL HIGH (ref 0.40–1.20)
GFR: 22.29 mL/min — ABNORMAL LOW (ref >60.00)
Glucose, Bld: 452 mg/dL — ABNORMAL HIGH (ref 70–99)
Potassium: 4.5 mEq/L (ref 3.5–5.1)
Sodium: 139 mEq/L (ref 135–145)
Total Bilirubin: 0.6 mg/dL (ref 0.2–1.2)
Total Protein: 5.7 g/dL — ABNORMAL LOW (ref 6.0–8.3)

## 2016-08-26 MED ORDER — FUROSEMIDE 40 MG PO TABS: 40.0000 mg | ORAL_TABLET | Freq: Every day | ORAL | 0 refills | Status: DC

## 2016-08-26 NOTE — Patient Instructions (Addendum)
Labs today  Stop glipizide.  Continue basaglar.  Humalog 5 units lunch and dinner (unless sugars in the am are high)  Follow up on Monday.  Take care  Dr. [name] [phone] Call if low sugars).

## 2016-08-26 NOTE — Assessment & Plan Note (Signed)
Uncontrolled. Stopping glipizide (given hypoglycemia. Continuing current dosing of basaglar.  Adding humalog with meals (5 units). Follow up on Monday.

## 2016-08-26 NOTE — Assessment & Plan Note (Signed)
At goal on Lipitor. Continue. 

## 2016-08-26 NOTE — Assessment & Plan Note (Signed)
New problem. O2 sat 92% today. Never smoker. Xray obtained and revealed pulmonary edema. Discussed lasix with nephrologist who is in agreement. Lasix 40 mg daily. Follow up on Monday. Will need echo and to see cardiology.

## 2016-08-26 NOTE — Progress Notes (Signed)
Subjective:  Patient ID: Kelly [name], female    DOB: [birth year]  Age: 76 y.o. MRN: [id number]  CC: Establish care (issues are below)  HPI Kelly [name] is a 76 y.o. female presents to the clinic today to establish care. Issues/concerns are below.  Renal failure  Recent hospitalization after outpatient labs revealed markedly elevated creatinine.  Had renal biopsy which showed ATN and Acute interstitial nephritis.  Suspected medication culprits were discontinued and patient started on steroids.  She is currently on 20 mg of prednisone daily. Creatinine has improved (results not available).  Following closely with nephrology, Dr. Candiss Norse.  She has ongoing edema and SOB (see below).  Good urine output.  DM-2  Most recent A1C was 7.1.  Diabetes currently uncontrolled, likely due to steroid.  Sugars markedly elevated and increase throughout the day (400-500's).  She is having frequent AM hypoglycemia.  Currently on glipizide and Levemir (16 units in the AM).  SOB  Ongoing SOB with exertion over the past week.   No chest pain.   No recent cough, fever.  Has been hospitalized recently.  No other reported symptoms.  Hypertension  Reasonably well controlled.  Currently on Norvasc.  HLD  Lipids at goal on Lipitor.  Depression  Stable on Paxil.   PMH, Surgical Hx, Family Hx, Social History reviewed and updated as below.  Past Medical History:  Diagnosis Date  . Colon polyps   . Depression   . DM (diabetes mellitus), type 2 with renal complications ([place])   . Hyperlipidemia   . Hypertension   . Kidney stones   . 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    Past Surgical History:  Procedure Laterality Date  . APPENDECTOMY    . CHOLECYSTECTOMY    . COLONOSCOPY    . ESOPHAGOGASTRODUODENOSCOPY    . ESOPHAGOGASTRODUODENOSCOPY (EGD) WITH PROPOFOL N/A 03/23/2016   Procedure: ESOPHAGOGASTRODUODENOSCOPY (EGD) WITH  PROPOFOL;  Surgeon: Manya Silvas, MD;  Location: [facility] ENDOSCOPY;  Service: Endoscopy;  Laterality: N/A;  . RECTAL PROLAPSE REPAIR     x2   Family History  Problem Relation Age of Onset  . Stroke Mother   . Arthritis Mother   . Stroke Sister   . Stroke Sister   . Lung cancer Sister    Social History  Substance Use Topics  . Smoking status: Never Smoker  . Smokeless tobacco: Never Used  . Alcohol use No    Review of Systems  Respiratory: Positive for shortness of breath.   Neurological: Positive for numbness.  All other systems reviewed and are negative.   Objective:   Today's Vitals: BP (!) 142/68   Pulse 88   Temp 98.2 F (36.8 C) (Oral)   Ht 5\' 4"  (1.626 m)   Wt 155 lb 4 oz (70.4 kg)   SpO2 93%   BMI 26.65 kg/m   Physical Exam  Constitutional: She is oriented to person, place, and time.  Elderly female in NAD.  HENT:  Head: Normocephalic and atraumatic.  Mouth/Throat: Oropharynx is clear and moist.  Eyes: Conjunctivae are normal.  Neck: Neck supple.  Cardiovascular: Normal rate and regular rhythm.   Bilateral lower extremity edema.   Pulmonary/Chest: Effort normal.  No adventitious sounds appreciated.    Abdominal: Soft. She exhibits no distension. There is no tenderness.  Musculoskeletal: Normal range of motion.  Lymphadenopathy:    She has no cervical adenopathy.  Neurological: She is alert and oriented to person, place, and time.  Skin: No rash noted.  Psychiatric: She has a normal mood and affect.  Vitals reviewed.   Assessment & Plan:   Problem List Items Addressed This Visit    SOB (shortness of breath)    New problem. O2 sat 92% today. Never smoker. Xray obtained and revealed pulmonary edema. Discussed lasix with nephrologist who is in agreement. Lasix 40 mg daily. Follow up on Monday. Will need echo and to see cardiology.      Relevant Orders   DG Chest 2 View (Completed)   Hypertension    Fair control given age. Continue Norvasc.        Relevant Medications   furosemide (LASIX) 40 MG tablet   Other Relevant Orders   Comprehensive metabolic panel (Completed)   Hyperlipidemia    At goal on Lipitor. Continue.      Relevant Medications   furosemide (LASIX) 40 MG tablet   DM (diabetes mellitus), type 2 with renal complications (HCC)    Uncontrolled. Stopping glipizide (given hypoglycemia. Continuing current dosing of basaglar.  Adding humalog with meals (5 units). Follow up on Monday.      CKD (chronic kidney disease) stage 4, GFR 15-29 ml/min (HCC) - Primary    Acute on chronic (improving). Labs today. Continue following with nephrology. Continue prednisone as prescribed. Hopefully can get off soon.       Other Visit Diagnoses    Anemia in stage 4 chronic kidney disease ([place])       Relevant Orders   CBC (Completed)      Meds ordered this encounter  Medications  . DISCONTD: furosemide (LASIX) 40 MG tablet    Sig: Take 1 tablet (40 mg total) by mouth daily.    Dispense:  30 tablet    Refill:  0  . furosemide (LASIX) 40 MG tablet    Sig: Take 1 tablet (40 mg total) by mouth daily.    Dispense:  30 tablet    Refill:  0   Follow-up: Monday  [place]

## 2016-08-26 NOTE — Progress Notes (Signed)
Pre visit review using our clinic review tool, if applicable. No additional management support is needed unless otherwise documented below in the visit note. 

## 2016-08-26 NOTE — Assessment & Plan Note (Signed)
Acute on chronic (improving). Labs today. Continue following with nephrology. Continue prednisone as prescribed. Hopefully can get off soon.

## 2016-08-26 NOTE — Assessment & Plan Note (Signed)
Fair control given age. Continue Norvasc.

## 2016-08-29 ENCOUNTER — Telehealth: Payer: Self-pay | Admitting: Family Medicine

## 2016-08-29 ENCOUNTER — Ambulatory Visit (INDEPENDENT_AMBULATORY_CARE_PROVIDER_SITE_OTHER): Payer: Medicare Other | Admitting: Family Medicine

## 2016-08-29 ENCOUNTER — Encounter: Payer: Self-pay | Admitting: Family Medicine

## 2016-08-29 DIAGNOSIS — Z794 Long term (current) use of insulin: Secondary | ICD-10-CM

## 2016-08-29 DIAGNOSIS — J810 Acute pulmonary edema: Secondary | ICD-10-CM

## 2016-08-29 DIAGNOSIS — N184 Chronic kidney disease, stage 4 (severe): Secondary | ICD-10-CM | POA: Diagnosis not present

## 2016-08-29 DIAGNOSIS — E1122 Type 2 diabetes mellitus with diabetic chronic kidney disease: Secondary | ICD-10-CM | POA: Diagnosis not present

## 2016-08-29 DIAGNOSIS — J811 Chronic pulmonary edema: Secondary | ICD-10-CM | POA: Insufficient documentation

## 2016-08-29 LAB — BASIC METABOLIC PANEL
BUN: 50 mg/dL — ABNORMAL HIGH (ref 6–23)
CO2: 36 mEq/L — ABNORMAL HIGH (ref 19–32)
Calcium: 9.3 mg/dL (ref 8.4–10.5)
Chloride: 94 mEq/L — ABNORMAL LOW (ref 96–112)
Creatinine, Ser: 2.51 mg/dL — ABNORMAL HIGH (ref 0.40–1.20)
GFR: 19.85 mL/min — ABNORMAL LOW (ref >60.00)
Glucose, Bld: 621 mg/dL (ref 70–99)
Potassium: 4.6 mEq/L (ref 3.5–5.1)
Sodium: 136 mEq/L (ref 135–145)

## 2016-08-29 MED ORDER — INSULIN LISPRO 100 UNIT/ML (KWIKPEN): 5.0000 [IU] | PEN_INJECTOR | Freq: Three times a day (TID) | SUBCUTANEOUS | 0 refills | Status: DC

## 2016-08-29 NOTE — Progress Notes (Signed)
Pre visit review using our clinic review tool, if applicable. No additional management support is needed unless otherwise documented below in the visit note. 

## 2016-08-29 NOTE — Telephone Encounter (Signed)
Pt called looking for GFR and Creatine lab results. Please advise, thank you!  Call pt @ [phone]

## 2016-08-29 NOTE — Assessment & Plan Note (Signed)
Still uncontrolled but lows. Increase Humalog to 5/8/8. Continue Basaglar 16 units daily.

## 2016-08-29 NOTE — Patient Instructions (Signed)
Continue your medications.  We will arrange the cardiology appt.  Follow up next week  Take care  Dr. [name]

## 2016-08-29 NOTE — Progress Notes (Signed)
Subjective:  Patient ID: Kelly [name], female    DOB: 1941/05/08  Age: 76 y.o. MRN: [id number]  CC: Follow up  HPI:  76 year old female with OSA, hypertension, hyperlipidemia, DM 2, chronic kidney disease (recent acute on chronic renal failure) presents for follow up.  DM-2  Patient has had no further hypoglycemic episodes.  She has taken the Humalog as prescribed.  Continues on Basaglar 16 units daily.  Blood sugars remain high, particularly as the day goes on (231-568).  Pulmonary edema  Noted on x-ray.  Patient tolerating Lasix without difficulty.  Needs metabolic panel today.  SOB mildly improved.  Social Hx   Social History   Social History  . Marital status: Married    Spouse name: N/A  . Number of children: N/A  . Years of education: N/A   Social History Main Topics  . Smoking status: Never Smoker  . Smokeless tobacco: Never Used  . Alcohol use No  . Drug use: No  . Sexual activity: Not Currently    Partners: Male   Other Topics Concern  . None   Social History Narrative  . None    Review of Systems  Constitutional: Negative.   Respiratory: Positive for shortness of breath.   Cardiovascular: Negative for chest pain.  Musculoskeletal:       Muscle cramps.   Objective:  BP 126/70   Pulse 81   Temp 98.2 F (36.8 C) (Oral)   Wt 150 lb 2 oz (68.1 kg)   SpO2 96%   BMI 25.77 kg/m   BP/Weight 08/29/2016 08/26/2016 08/05/5186  Systolic BP 416 606 301  Diastolic BP 70 68 63  Wt. (Lbs) 150.13 155.25 -  BMI 25.77 26.65 -   Physical Exam  Constitutional: She is oriented to person, place, and time. She appears well-developed. No distress.  Cardiovascular: Normal rate and regular rhythm.   Pulmonary/Chest: Effort normal and breath sounds normal.  Neurological: She is alert and oriented to person, place, and time.  Psychiatric: She has a normal mood and affect.  Vitals reviewed.  Lab Results  Component Value Date   WBC 7.2  08/26/2016   HGB 9.9 (L) 08/26/2016   HCT 30.1 (L) 08/26/2016   PLT 186.0 08/26/2016   GLUCOSE 452 (H) 08/26/2016   CHOL 100 07/09/2016   TRIG 123 07/09/2016   HDL 17 (L) 07/09/2016   LDLCALC 58 07/09/2016   ALT 102 (H) 08/26/2016   AST 31 08/26/2016   NA 139 08/26/2016   K 4.5 08/26/2016   CL 101 08/26/2016   CREATININE 2.27 (H) 08/26/2016   BUN 38 (H) 08/26/2016   CO2 34 (H) 08/26/2016   TSH 0.719 07/09/2016   INR 0.98 07/15/2016   HGBA1C 7.1 (H) 07/09/2016    Assessment & Plan:   Problem List Items Addressed This Visit    Pulmonary edema    Improved slightly with lasix. Will continue BMP today. Sending to cardiology.       DM (diabetes mellitus), type 2 with renal complications (HCC)    Still uncontrolled but lows. Increase Humalog to 5/8/8. Continue Basaglar 16 units daily.       Relevant Medications   insulin lispro (HUMALOG KWIKPEN) 100 UNIT/ML KiwkPen   Other Relevant Orders   Basic Metabolic Panel (BMET)      Meds ordered this encounter  Medications  . DISCONTD: insulin lispro (HUMALOG KWIKPEN) 100 UNIT/ML KiwkPen    Sig: Inject 5 Units into the skin 2 (two) times daily before a  meal.  . insulin lispro (HUMALOG KWIKPEN) 100 UNIT/ML KiwkPen    Sig: Inject 0.05-0.08 mLs (5-8 Units total) into the skin 3 (three) times daily before meals. 5 units before breakfast, 8 units before lunch & dinner    Dispense:  15 mL    Refill:  0   Follow-up: Next week with PharmD  [place]

## 2016-08-29 NOTE — Telephone Encounter (Signed)
Pt was given results and gave verbal understanding. See results note.

## 2016-08-29 NOTE — Assessment & Plan Note (Signed)
Improved slightly with lasix. Will continue BMP today. Sending to cardiology.

## 2016-09-01 DIAGNOSIS — G4733 Obstructive sleep apnea (adult) (pediatric): Secondary | ICD-10-CM | POA: Diagnosis not present

## 2016-09-02 DIAGNOSIS — E1129 Type 2 diabetes mellitus with other diabetic kidney complication: Secondary | ICD-10-CM | POA: Diagnosis not present

## 2016-09-02 DIAGNOSIS — N10 Acute tubulo-interstitial nephritis: Secondary | ICD-10-CM | POA: Diagnosis not present

## 2016-09-02 DIAGNOSIS — I10 Essential (primary) hypertension: Secondary | ICD-10-CM | POA: Diagnosis not present

## 2016-09-02 DIAGNOSIS — N179 Acute kidney failure, unspecified: Secondary | ICD-10-CM | POA: Diagnosis not present

## 2016-09-05 ENCOUNTER — Ambulatory Visit (INDEPENDENT_AMBULATORY_CARE_PROVIDER_SITE_OTHER): Payer: Medicare Other | Admitting: Pharmacist

## 2016-09-05 ENCOUNTER — Telehealth: Payer: Self-pay | Admitting: Family Medicine

## 2016-09-05 ENCOUNTER — Encounter: Payer: Self-pay | Admitting: Pharmacist

## 2016-09-05 ENCOUNTER — Telehealth: Payer: Self-pay | Admitting: *Deleted

## 2016-09-05 DIAGNOSIS — E1122 Type 2 diabetes mellitus with diabetic chronic kidney disease: Secondary | ICD-10-CM | POA: Diagnosis not present

## 2016-09-05 DIAGNOSIS — Z794 Long term (current) use of insulin: Secondary | ICD-10-CM | POA: Diagnosis not present

## 2016-09-05 DIAGNOSIS — E785 Hyperlipidemia, unspecified: Secondary | ICD-10-CM | POA: Diagnosis not present

## 2016-09-05 DIAGNOSIS — N184 Chronic kidney disease, stage 4 (severe): Secondary | ICD-10-CM | POA: Diagnosis not present

## 2016-09-05 MED ORDER — BASAGLAR KWIKPEN 100 UNIT/ML ~~LOC~~ SOPN: 18.0000 [IU] | PEN_INJECTOR | Freq: Every day | SUBCUTANEOUS | 0 refills | Status: DC

## 2016-09-05 MED ORDER — INSULIN LISPRO 100 UNIT/ML (KWIKPEN): 7.0000 [IU] | PEN_INJECTOR | Freq: Three times a day (TID) | SUBCUTANEOUS | 0 refills | Status: DC

## 2016-09-05 MED ORDER — ATORVASTATIN CALCIUM 10 MG PO TABS: 10.0000 mg | ORAL_TABLET | Freq: Every day | ORAL | 1 refills | Status: DC

## 2016-09-05 NOTE — Progress Notes (Signed)
S:    Chief Complaint  Patient presents with  . Medication Management    Diabetes   Patient arrives in good spirits ambulating without assistance and accompanied by her daughter.  Presents for diabetes evaluation, education, and management at the request of Dr [name]. Patient was referred on 08/29/16.  Patient was last seen by Primary Care Provider on 08/29/16.  Today patient states she saw nephrology last week and they decreased her prednisone to 20 mg daily.  She is likely to continue this dose for ~1 month.    Patient reports Diabetes was diagnosed ~2008.   Patient reports adherence with medications.  Current diabetes medications include: Basaglar 16 units, Humalog 5 units before breakfast and 8 units before lunch and dinner Current hypertension medications include:   Patient denies hypoglycemic events.  Patient reported dietary habits: Eats 3 meals/day Breakfast:oatmeal with blueberries/raspberry's and rice cakes  Lunch:chicken salad or tuna salad wrapped in lettuce or left overs  Dinner:tilapia, fish, or meatloaf with vegetables sweet potatoes, bell peppers, red potatoes, cauliflower, zucchini, yellow squash, carrots Snacks:Graham crackers, pound cake occasionally, sherbet ice cream, fruit salad Drinks:Hot green tea, water, apple juice/white grape juice  Patient reported exercise habits: No exerting herself per MD.     Patient reports nocturia 4x/night.  Patient denies neuropathy. Patient reports visual changes with sometimes blurry vision.   Patient reports self foot exams. Denies changes  O:  Physical Exam  Constitutional: She appears well-developed and well-nourished.     Review of Systems  Constitutional: Negative.    Lab Results  Component Value Date   HGBA1C 7.1 (H) 07/09/2016   CBGs: See Scanned CBG log Fasting CBGs: 88, 173, 297, 160, 233, 180, 170 3 hours after Breakfast: 175, 438 Before Lunch: 239, 154, 171 3 hours after lunch: Hi, 287, 151, 129, 527,  311 Before Dinner: 589, 163, 370, 266 3 hours after dinner: 427, 329  Lipid Panel     Component Value Date/Time   CHOL 100 07/09/2016 0312   TRIG 123 07/09/2016 0312   HDL 17 (L) 07/09/2016 0312   CHOLHDL 5.9 07/09/2016 0312   VLDL 25 07/09/2016 0312   LDLCALC 58 07/09/2016 0312    A/P: Diabetes longstanding diagnosed currently uncontrolled but with slightly improved CBGs since starting Humalog last week. Patient denies hypoglycemic events and is able to verbalize appropriate hypoglycemia management plan. Patient reports adherence with medication except she did miss a dose or 2 of Humalog. Control is suboptimal due to prednisone treatment and diet. Following discussion and approval by Dr [name], the following medication changes were made:  -Increased dose of basal insulin Basaglar (insulin glargine) to 18 units daily. -Increased dose of rapid insulin Humalog (insulin lispro) to 7 units before breakfast and 10 units before lunch and dinner.  Patient may also take an additional 5 units of Humalog if CBGs > 400.  Sample provided -Discussed low carbohydrate diet and portion sizes.  Provided handout on Planning Healthy Meals and Low Carbohydrate snacks -Discussed signs/symptoms/treatment of hypoglycemia -Instructed patient to call if she experiences hypoglycemia or frequent CBGs>400 mg/dL  -Next A1C anticipated 10/2016.    ASCVD risk greater than 7.5%. Patient recently decreased from Atorvastatin 20 mg to 10 mg due to cramping.  Patient has not started 10 mg dose yet. Continued Aspirin 81 mg and Continued atorvastatin 10 mg daily. Refill sent to her pharmacy.    Written patient instructions provided.  Total time in face to face counseling 60 minutes.   Follow up  in Pharmacist Clinic Visit in 2 weeks.    Patient was seen with Dr [name] today in clinic and medication changes were discussed and approved prior to initiation

## 2016-09-05 NOTE — Patient Instructions (Addendum)
Increase Basaglar to 18 units daily  Increase Humalog to 7 units before breakfast, and 10 units before lunch and dinner  If blood glucose is greater than 400 you can take 5 units as needed.    Please call clinic if you are seeing frequent blood glucose greater than 400 or if you have any low blood glucose.    Followup with Bennye Alm, PharmD in 2 weeks

## 2016-09-05 NOTE — Assessment & Plan Note (Signed)
ASCVD risk greater than 7.5%. Patient recently decreased from Atorvastatin 20 mg to 10 mg due to cramping.  Patient has not started 10 mg dose yet. Continued Aspirin 81 mg and Continued atorvastatin 10 mg daily. Refill sent to her pharmacy.

## 2016-09-05 NOTE — Progress Notes (Signed)
Care was provided under my supervision. I agree with the management as indicated in the note.  Kristene Liberati DO  

## 2016-09-05 NOTE — Progress Notes (Signed)
Care was provided under my supervision. I agree with the management as indicated in the note.  Noga Fogg DO  

## 2016-09-05 NOTE — Assessment & Plan Note (Signed)
Diabetes longstanding diagnosed currently uncontrolled but with slightly improved CBGs since starting Humalog last week. Patient denies hypoglycemic events and is able to verbalize appropriate hypoglycemia management plan. Patient reports adherence with medication except she did miss a dose or 2 of Humalog. Control is suboptimal due to prednisone treatment and diet. Following discussion and approval by Dr [name], the following medication changes were made:  -Increased dose of basal insulin Basaglar (insulin glargine) to 18 units daily. -Increased dose of rapid insulin Humalog (insulin lispro) to 7 units before breakfast and 10 units before lunch and dinner.  Patient may also take an additional 5 units of Humalog if CBGs > 400.  Sample provided -Discussed low carbohydrate diet and portion sizes.  Provided handout on Planning Healthy Meals and Low Carbohydrate snacks -Discussed signs/symptoms/treatment of hypoglycemia -Instructed patient to call if she experiences hypoglycemia or frequent CBGs>400 mg/dL  -Next A1C anticipated 10/2016.

## 2016-09-05 NOTE — Telephone Encounter (Signed)
Daughter reported that patients kidney specialist has taken her off of Norvasc  Contact [phone]

## 2016-09-06 ENCOUNTER — Other Ambulatory Visit: Payer: Self-pay | Admitting: Family Medicine

## 2016-09-06 ENCOUNTER — Telehealth: Payer: Self-pay | Admitting: *Deleted

## 2016-09-06 DIAGNOSIS — M25519 Pain in unspecified shoulder: Secondary | ICD-10-CM | POA: Diagnosis not present

## 2016-09-06 DIAGNOSIS — T2101XA Burn of unspecified degree of chest wall, initial encounter: Secondary | ICD-10-CM | POA: Diagnosis not present

## 2016-09-06 DIAGNOSIS — E1165 Type 2 diabetes mellitus with hyperglycemia: Secondary | ICD-10-CM | POA: Diagnosis not present

## 2016-09-06 DIAGNOSIS — M6281 Muscle weakness (generalized): Secondary | ICD-10-CM | POA: Diagnosis not present

## 2016-09-06 DIAGNOSIS — M47812 Spondylosis without myelopathy or radiculopathy, cervical region: Secondary | ICD-10-CM | POA: Diagnosis not present

## 2016-09-06 DIAGNOSIS — M5136 Other intervertebral disc degeneration, lumbar region: Secondary | ICD-10-CM | POA: Diagnosis not present

## 2016-09-06 DIAGNOSIS — M7062 Trochanteric bursitis, left hip: Secondary | ICD-10-CM | POA: Diagnosis not present

## 2016-09-06 DIAGNOSIS — M542 Cervicalgia: Secondary | ICD-10-CM | POA: Diagnosis not present

## 2016-09-06 DIAGNOSIS — M5032 Other cervical disc degeneration, mid-cervical region, unspecified level: Secondary | ICD-10-CM | POA: Diagnosis not present

## 2016-09-06 DIAGNOSIS — R52 Pain, unspecified: Secondary | ICD-10-CM | POA: Diagnosis not present

## 2016-09-06 DIAGNOSIS — M545 Low back pain: Secondary | ICD-10-CM | POA: Diagnosis not present

## 2016-09-06 DIAGNOSIS — M25572 Pain in left ankle and joints of left foot: Secondary | ICD-10-CM | POA: Diagnosis not present

## 2016-09-06 NOTE — Telephone Encounter (Signed)
Ivin Booty from Dr. Sondra Come office requested to know if all labs requested were received via-fax.  [phone]

## 2016-09-06 NOTE — Telephone Encounter (Signed)
Dr.Tate's office was called and told that labs were received.

## 2016-09-07 ENCOUNTER — Encounter: Payer: Self-pay | Admitting: Cardiology

## 2016-09-07 ENCOUNTER — Ambulatory Visit (INDEPENDENT_AMBULATORY_CARE_PROVIDER_SITE_OTHER): Payer: Medicare Other | Admitting: Cardiology

## 2016-09-07 ENCOUNTER — Telehealth: Payer: Self-pay | Admitting: Family Medicine

## 2016-09-07 VITALS — BP 128/64 | HR 79 | Ht 64.0 in | Wt 150.8 lb

## 2016-09-07 DIAGNOSIS — I10 Essential (primary) hypertension: Secondary | ICD-10-CM | POA: Diagnosis not present

## 2016-09-07 DIAGNOSIS — E785 Hyperlipidemia, unspecified: Secondary | ICD-10-CM

## 2016-09-07 DIAGNOSIS — Z794 Long term (current) use of insulin: Principal | ICD-10-CM

## 2016-09-07 DIAGNOSIS — R0602 Shortness of breath: Secondary | ICD-10-CM | POA: Diagnosis not present

## 2016-09-07 DIAGNOSIS — N184 Chronic kidney disease, stage 4 (severe): Secondary | ICD-10-CM

## 2016-09-07 DIAGNOSIS — E1122 Type 2 diabetes mellitus with diabetic chronic kidney disease: Secondary | ICD-10-CM

## 2016-09-07 NOTE — Progress Notes (Signed)
Cardiology Office Note   Date:  09/07/2016   ID:  Kelly [name], DOB 06/19/1940, MRN [id number]  Referring Doctor:  Coral Spikes, DO   Cardiologist:   Wende Bushy, MD   Reason for consultation:  Chief Complaint  Patient presents with  . other    Ref by Dr. [name] for shortness of breath & edema. Meds reviewed by the pt. verbally.       History of Present Illness: Kelly [name] is a 76 y.o. female who is being seen today for the evaluation of shortness of breath at the request of Coral Spikes, DO.  Back in March 2018, pt developed renal failure, noted on routine blood work. 12 day stay in the hospital, biopsy showed acute interstitial nephritis. Being followed by Dr. Candiss Norse. Since then, had issues with leg swelling for which she takes lasix. She has noticed improvement.  2 weeks ago, she developed SOB with minimal exertion. No CP. Moderate intensity, lasts throughout activity, goes away with rest. Persistent. CXR done showed pulmonary edema hence consultation.  Pt denies PND ,orthopnea, palpitations, syncope.  ROS:  Please see the history of present illness. Aside from mentioned under HPI, all other systems are reviewed and negative.     Past Medical History:  Diagnosis Date  . Colon polyps   . Depression   . DM (diabetes mellitus), type 2 with renal complications ([place])   . Hyperlipidemia   . Hypertension   . Kidney stones   . 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     Past Surgical History:  Procedure Laterality Date  . APPENDECTOMY    . CHOLECYSTECTOMY    . COLONOSCOPY    . ESOPHAGOGASTRODUODENOSCOPY    . ESOPHAGOGASTRODUODENOSCOPY (EGD) WITH PROPOFOL N/A 03/23/2016   Procedure: ESOPHAGOGASTRODUODENOSCOPY (EGD) WITH PROPOFOL;  Surgeon: Manya Silvas, MD;  Location: [facility] ENDOSCOPY;  Service: Endoscopy;  Laterality: N/A;  . RECTAL PROLAPSE REPAIR     x2     reports that she has never smoked. She has never used  smokeless tobacco. She reports that she does not drink alcohol or use drugs.   family history includes Arthritis in her mother; Heart attack (age of onset: 33) in her father; Lung cancer in her sister; Stroke in her mother, sister, and sister.    Outpatient Medications Prior to Visit  Medication Sig Dispense Refill  . aspirin EC 81 MG tablet Take 81 mg by mouth daily.    Marland Kitchen atorvastatin (LIPITOR) 10 MG tablet Take 1 tablet (10 mg total) by mouth daily. 90 tablet 1  . calcium citrate-vitamin D (CITRACAL+D) 315-200 MG-UNIT tablet Take 1 tablet by mouth 2 (two) times daily.    . furosemide (LASIX) 40 MG tablet Take 1 tablet (40 mg total) by mouth daily. 30 tablet 0  . gabapentin (NEURONTIN) 300 MG capsule Take 300 mg by mouth at bedtime.    . Insulin Glargine (BASAGLAR KWIKPEN) 100 UNIT/ML SOPN Inject 0.18 mLs (18 Units total) into the skin at bedtime. (Patient taking differently: Inject 18 Units into the skin every morning. ) 1 pen 0  . insulin lispro (HUMALOG KWIKPEN) 100 UNIT/ML KiwkPen Inject 0.07-0.1 mLs (7-10 Units total) into the skin 3 (three) times daily before meals. 7 units before breakfast, 10 units before lunch & dinner 3 mL 0  . PARoxetine (PAXIL-CR) 25 MG 24 hr tablet Take 25 mg by mouth daily.    . predniSONE (DELTASONE) 10 MG tablet Take 20  mg by mouth daily with breakfast.     No facility-administered medications prior to visit.      Allergies: Prilosec otc [omeprazole magnesium]; Sucralfate; Amoxicillin; and Morphine and related    PHYSICAL EXAM: VS:  BP 128/64 (BP Location: Right Arm, Patient Position: Sitting, Cuff Size: Normal)   Pulse 79   Ht 5\' 4"  (1.626 m)   Wt 150 lb 12 oz (68.4 kg)   BMI 25.88 kg/m  , Body mass index is 25.88 kg/m. Wt Readings from Last 3 Encounters:  09/07/16 150 lb 12 oz (68.4 kg)  09/05/16 149 lb (67.6 kg)  08/29/16 150 lb 2 oz (68.1 kg)    GENERAL:  well developed, well nourished, not in acute distress HEENT: normocephalic, pink  conjunctivae, anicteric sclerae, no xanthelasma, normal dentition, oropharynx clear NECK:  no neck vein engorgement, JVP normal, no hepatojugular reflux, carotid upstroke brisk and symmetric, no bruit, no thyromegaly, no lymphadenopathy LUNGS:  good respiratory effort, clear to auscultation bilaterally CV:  PMI not displaced, no thrills, no lifts, S1 and S2 within normal limits, no palpable S3 or S4, no murmurs, no rubs, no gallops ABD:  Soft, nontender, nondistended, normoactive bowel sounds, no abdominal aortic bruit, no hepatomegaly, no splenomegaly MS: nontender back, no kyphosis, no scoliosis, no joint deformities EXT:  2+ DP/PT pulses,+-++ edema, no varicosities, no cyanosis, no clubbing SKIN: warm, nondiaphoretic, normal turgor, no ulcers NEUROPSYCH: alert, oriented to person, place, and time, sensory/motor grossly intact, normal mood, appropriate affect  Recent Labs: 07/09/2016: TSH 0.719 08/26/2016: ALT 102; Hemoglobin 9.9; Platelets 186.0 08/29/2016: BUN 50; Creatinine, Ser 2.51; Potassium 4.6; Sodium 136   Lipid Panel    Component Value Date/Time   CHOL 100 07/09/2016 0312   TRIG 123 07/09/2016 0312   HDL 17 (L) 07/09/2016 0312   CHOLHDL 5.9 07/09/2016 0312   VLDL 25 07/09/2016 0312   LDLCALC 58 07/09/2016 0312     Other studies Reviewed:  EKG:  The ekg from 09/07/2016 was personally reviewed by me and it revealed sinus rhythm with sinus arrhythmia 79bpm.  Additional studies/ records that were reviewed personally reviewed by me today include: none available    ASSESSMENT AND PLAN: SOB/DOE Pulm edema on CXR No NVE on exam, lungs clear, normal heart sounds Rec echo Rec lexiscan stress test due to risk factors for CAD (DM, HTN, age, etc) Her symptoms may also be related to renal failure (will defer mgmt of diuretics to nephrology)  HTN BP is well controlled. Continue monitoring BP. Continue current medical therapy and lifestyle changes.  Hyperlipidemia LDL goal < 70.  Being managed by PCP   Current medicines are reviewed at length with the patient today.  The patient does not have concerns regarding medicines.  Labs/ tests ordered today include:  Orders Placed This Encounter  Procedures  . NM Myocar Multi W/Spect W/Wall Motion / EF  . EKG 12-Lead  . ECHOCARDIOGRAM COMPLETE    I had a lengthy and detailed discussion with the patient regarding diagnoses, prognosis, diagnostic options.     Disposition:   FU with Cardiology after tests    Thank you for this consultation. We will forwarding this consultation to referring physician.   I spent at least 45 minutes with the patient today and more than 50% of the time was spent counseling the patient and coordinating care.   Signed, Wende Bushy, MD  09/07/2016 2:14 PM    [place]  This note was generated in part with voice recognition  software and I apologize for any typographical errors that were not detected and corrected.

## 2016-09-07 NOTE — Telephone Encounter (Signed)
Pt wanted to let Dr. [name] know that she takes the [employer] injection 0.84mls mLs in the morning. Her medication list states at bedtime. Please change.  Thanks.

## 2016-09-07 NOTE — Progress Notes (Deleted)
Cardiology Office Note   Date:  09/07/2016   ID:  Kelly [name], DOB 1941-03-31, MRN [id number]  Referring Doctor:  Coral Spikes, DO   Cardiologist:   Wende Bushy, MD   Reason for consultation:  No chief complaint on file.     History of Present Illness: Kelly [name] is a 76 y.o. female who is being seen today for the evaluation of *** at the request of [name], [name], DO.    ROS:  Please see the history of present illness. Aside from mentioned under HPI, all other systems are reviewed and negative.     Past Medical History:  Diagnosis Date  . Colon polyps   . Depression   . DM (diabetes mellitus), type 2 with renal complications ([place])   . Hyperlipidemia   . Hypertension   . Kidney stones   . 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     Past Surgical History:  Procedure Laterality Date  . APPENDECTOMY    . CHOLECYSTECTOMY    . COLONOSCOPY    . ESOPHAGOGASTRODUODENOSCOPY    . ESOPHAGOGASTRODUODENOSCOPY (EGD) WITH PROPOFOL N/A 03/23/2016   Procedure: ESOPHAGOGASTRODUODENOSCOPY (EGD) WITH PROPOFOL;  Surgeon: Manya Silvas, MD;  Location: [facility] ENDOSCOPY;  Service: Endoscopy;  Laterality: N/A;  . RECTAL PROLAPSE REPAIR     x2     reports that she has never smoked. She has never used smokeless tobacco. She reports that she does not drink alcohol or use drugs.   family history includes Arthritis in her mother; Lung cancer in her sister; Stroke in her mother, sister, and sister.   Outpatient Medications Prior to Visit  Medication Sig Dispense Refill  . aspirin EC 81 MG tablet Take 81 mg by mouth daily.    Marland Kitchen atorvastatin (LIPITOR) 10 MG tablet Take 1 tablet (10 mg total) by mouth daily. 90 tablet 1  . calcium citrate-vitamin D (CITRACAL+D) 315-200 MG-UNIT tablet Take 1 tablet by mouth 2 (two) times daily.    . furosemide (LASIX) 40 MG tablet Take 1 tablet (40 mg total) by mouth daily. 30 tablet 0  . gabapentin (NEURONTIN)  300 MG capsule Take 300 mg by mouth at bedtime.    . Insulin Glargine (BASAGLAR KWIKPEN) 100 UNIT/ML SOPN Inject 0.18 mLs (18 Units total) into the skin at bedtime. 1 pen 0  . insulin lispro (HUMALOG KWIKPEN) 100 UNIT/ML KiwkPen Inject 0.07-0.1 mLs (7-10 Units total) into the skin 3 (three) times daily before meals. 7 units before breakfast, 10 units before lunch & dinner 3 mL 0  . PARoxetine (PAXIL-CR) 25 MG 24 hr tablet Take 25 mg by mouth daily.    . predniSONE (DELTASONE) 10 MG tablet Take 20 mg by mouth daily with breakfast.     No facility-administered medications prior to visit.      Allergies: Prilosec otc [omeprazole magnesium]; Sucralfate; Amoxicillin; and Morphine and related    PHYSICAL EXAM: VS:  There were no vitals taken for this visit. , There is no height or weight on file to calculate BMI. Wt Readings from Last 3 Encounters:  09/05/16 149 lb (67.6 kg)  08/29/16 150 lb 2 oz (68.1 kg)  08/26/16 155 lb 4 oz (70.4 kg)    GENERAL:  well developed, well nourished, *** obese, not in acute distress HEENT: normocephalic, pink conjunctivae, anicteric sclerae, no xanthelasma, normal dentition, oropharynx clear NECK:  no neck vein engorgement, JVP normal, no hepatojugular reflux, carotid upstroke brisk and  symmetric, no bruit, no thyromegaly, no lymphadenopathy LUNGS:  good respiratory effort, clear to auscultation bilaterally CV:  PMI not displaced, no thrills, no lifts, S1 and S2 within normal limits, no palpable S3 or S4, no murmurs, no rubs, no gallops ABD:  Soft, nontender, nondistended, normoactive bowel sounds, no abdominal aortic bruit, no hepatomegaly, no splenomegaly MS: nontender back, no kyphosis, no scoliosis, no joint deformities EXT:  2+ DP/PT pulses, no edema, no varicosities, no cyanosis, no clubbing SKIN: warm, nondiaphoretic, normal turgor, no ulcers NEUROPSYCH: alert, oriented to person, place, and time, sensory/motor grossly intact, normal mood, appropriate  affect  Recent Labs: 07/09/2016: TSH 0.719 08/26/2016: ALT 102; Hemoglobin 9.9; Platelets 186.0 08/29/2016: BUN 50; Creatinine, Ser 2.51; Potassium 4.6; Sodium 136   Lipid Panel    Component Value Date/Time   CHOL 100 07/09/2016 0312   TRIG 123 07/09/2016 0312   HDL 17 (L) 07/09/2016 0312   CHOLHDL 5.9 07/09/2016 0312   VLDL 25 07/09/2016 0312   LDLCALC 58 07/09/2016 0312     Other studies Reviewed:  EKG:  The ekg from *** was personally reviewed by me and it revealed ***  Additional studies/ records that were reviewed personally reviewed by me today include: ***   ASSESSMENT AND PLAN:    Current medicines are reviewed at length with the patient today.  The patient {ACTIONS; HAS/DOES NOT HAVE:19233} concerns regarding medicines.  Labs/ tests ordered today include: No orders of the defined types were placed in this encounter.   I had a lengthy and detailed discussion with the patient regarding diagnoses, prognosis, diagnostic options, treatment options ***, and side effects of medications.   I counseled the patient on importance of lifestyle modification including heart healthy diet, regular physical activity *** , and smoking cessation.   Disposition:   FU with Cardiology after tests ***  Thank you for this consultation. We will forwarding this consultation to referring physician.   Signed, Wende Bushy, MD  09/07/2016 1:04 PM    [place]  This note was generated in part with voice recognition software and I apologize for any typographical errors that were not detected and corrected.

## 2016-09-07 NOTE — Patient Instructions (Addendum)
Testing/Procedures: Your physician has requested that you have an echocardiogram. Echocardiography is a painless test that uses sound waves to create images of your heart. It provides your doctor with information about the size and shape of your heart and how well your heart's chambers and valves are working. This procedure takes approximately one hour. There are no restrictions for this procedure.  [place]  Your caregiver has ordered a Stress Test with nuclear imaging. The purpose of this test is to evaluate the blood supply to your heart muscle. This procedure is referred to as a "Non-Invasive Stress Test." This is because other than having an IV started in your vein, nothing is inserted or "invades" your body. Cardiac stress tests are done to find areas of poor blood flow to the heart by determining the extent of coronary artery disease (CAD). Some patients exercise on a treadmill, which naturally increases the blood flow to your heart, while others who are  unable to walk on a treadmill due to physical limitations have a pharmacologic/chemical stress agent called Lexiscan . This medicine will mimic walking on a treadmill by temporarily increasing your coronary blood flow.   Please note: these test may take anywhere between 2-4 hours to complete  PLEASE REPORT TO [place] AT THE FIRST DESK WILL DIRECT YOU WHERE TO GO  Date of Procedure:_Wednesday Sep 14, 2016 __  Arrival Time for Procedure:_Arrive at 07:15AM__  Instructions regarding medication:   _X___ : Hold diabetes medication Basaglar the morning of procedure  _X___:  Hold Humalog the morning of procedure.   PLEASE NOTIFY THE OFFICE AT LEAST 31 HOURS IN ADVANCE IF YOU ARE UNABLE TO KEEP YOUR APPOINTMENT.  [phone] AND  PLEASE NOTIFY NUCLEAR MEDICINE AT [facility] AT LEAST 24 HOURS IN ADVANCE IF YOU ARE UNABLE TO KEEP YOUR APPOINTMENT. [phone]  How to prepare for your Myoview test:  1. Do  not eat or drink after midnight 2. No caffeine for 24 hours prior to test 3. No smoking 24 hours prior to test. 4. Your medication may be taken with water.  If your doctor stopped a medication because of this test, do not take that medication. 5. Ladies, please do not wear dresses.  Skirts or pants are appropriate. Please wear a short sleeve shirt. 6. No perfume, cologne or lotion. 7. Wear comfortable walking shoes. No heels!   Follow-Up: Your physician recommends that you schedule a follow-up appointment after testing.   It was a pleasure seeing you today here in the office. Please do not hesitate to give [place] a call back if you have any further questions. [place], BSN   Echocardiogram An echocardiogram, or echocardiography, uses sound waves (ultrasound) to produce an image of your heart. The echocardiogram is simple, painless, obtained within a short period of time, and offers valuable information to your health care provider. The images from an echocardiogram can provide information such as:  Evidence of coronary artery disease (CAD).  Heart size.  Heart muscle function.  Heart valve function.  Aneurysm detection.  Evidence of a past heart attack.  Fluid buildup around the heart.  Heart muscle thickening.  Assess heart valve function. Tell a health care provider about:  Any allergies you have.  All medicines you are taking, including vitamins, herbs, eye drops, creams, and over-the-counter medicines.  Any problems you or family members have had with anesthetic medicines.  Any blood disorders you have.  Any surgeries you have had.  Any medical conditions you have.  Whether you are pregnant or may be pregnant. What happens before the procedure? No special preparation is needed. Eat and drink normally. What happens during the procedure?  In order to produce an image of your heart, gel will be applied to your chest and a wand-like tool  (transducer) will be moved over your chest. The gel will help transmit the sound waves from the transducer. The sound waves will harmlessly bounce off your heart to allow the heart images to be captured in real-time motion. These images will then be recorded.  You may need an IV to receive a medicine that improves the quality of the pictures. What happens after the procedure? You may return to your normal schedule including diet, activities, and medicines, unless your health care provider tells you otherwise. This information is not intended to replace advice given to you by your health care provider. Make sure you discuss any questions you have with your health care provider. Document Released: 04/22/2000 Document Revised: 12/12/2015 Document Reviewed: 12/31/2012 Elsevier Interactive Patient Education  2017 [place]. Pharmacologic Stress Electrocardiogram Introduction A pharmacologic stress electrocardiogram is a heart (cardiac) test that uses nuclear imaging to evaluate the blood supply to your heart. This test may also be called a pharmacologic stress electrocardiography. Pharmacologic means that a medicine is used to increase your heart rate and blood pressure. This stress test is done to find areas of poor blood flow to the heart by determining the extent of coronary artery disease (CAD). Some people exercise on a treadmill, which naturally increases the blood flow to the heart. For those people unable to exercise on a treadmill, a medicine is used. This medicine stimulates your heart and will cause your heart to beat harder and more quickly, as if you were exercising. Pharmacologic stress tests can help determine:  The adequacy of blood flow to your heart during increased levels of activity in order to clear you for discharge home.  The extent of coronary artery blockage caused by CAD.  Your prognosis if you have suffered a heart attack.  The effectiveness of cardiac procedures done,  such as an angioplasty, which can increase the circulation in your coronary arteries.  Causes of chest pain or pressure. LET [facility] CARE PROVIDER KNOW ABOUT:  Any allergies you have.  All medicines you are taking, including vitamins, herbs, eye drops, creams, and over-the-counter medicines.  Previous problems you or members of your family have had with the use of anesthetics.  Any blood disorders you have.  Previous surgeries you have had.  Medical conditions you have.  Possibility of pregnancy, if this applies.  If you are currently breastfeeding. RISKS AND COMPLICATIONS Generally, this is a safe procedure. However, as with any procedure, complications can occur. Possible complications include:  You develop pain or pressure in the following areas:  Chest.  Jaw or neck.  Between your shoulder blades.  Radiating down your left arm.  Headache.  Dizziness or light-headedness.  Shortness of breath.  Increased or irregular heartbeat.  Low blood pressure.  Nausea or vomiting.  Flushing.  Redness going up the arm and slight pain during injection of medicine.  Heart attack (rare). BEFORE THE PROCEDURE  Avoid all forms of caffeine for 24 hours before your test or as directed by your health care provider. This includes coffee, tea (even decaffeinated tea), caffeinated sodas, chocolate, cocoa, and certain pain medicines.  Follow your health care provider's instructions regarding eating and drinking before the test.  Take your medicines as directed at regular times with water unless instructed otherwise. Exceptions may include:  If you have diabetes, ask how you are to take your insulin or pills. It is common to adjust insulin dosing the morning of the test.  If you are taking beta-blocker medicines, it is important to talk to your health care provider about these medicines well before the date of your test. Taking beta-blocker medicines may interfere with the  test. In some cases, these medicines need to be changed or stopped 24 hours or more before the test.  If you wear a nitroglycerin patch, it may need to be removed prior to the test. Ask your health care provider if the patch should be removed before the test.  If you use an inhaler for any breathing condition, bring it with you to the test.  If you are an outpatient, bring a snack so you can eat right after the stress phase of the test.  Do not smoke for 4 hours prior to the test or as directed by your health care provider.  Do not apply lotions, powders, creams, or oils on your chest prior to the test.  Wear comfortable shoes and clothing. Let your health care provider know if you were unable to complete or follow the preparations for your test. PROCEDURE  Multiple patches (electrodes) will be put on your chest. If needed, small areas of your chest may be shaved to get better contact with the electrodes. Once the electrodes are attached to your body, multiple wires will be attached to the electrodes, and your heart rate will be monitored.  An IV access will be started. A nuclear trace (isotope) is given. The isotope may be given intravenously, or it may be swallowed. Nuclear refers to several types of radioactive isotopes, and the nuclear isotope lights up the arteries so that the nuclear images are clear. The isotope is absorbed by your body. This results in low radiation exposure.  A resting nuclear image is taken to show how your heart functions at rest.  A medicine is given through the IV access.  A second scan is done about 1 hour after the medicine injection and determines how your heart functions under stress.  During this stress phase, you will be connected to an electrocardiogram machine. Your blood pressure and oxygen levels will be monitored. What to expect after the procedure  Your heart rate and blood pressure will be monitored after the test.  You may return to your  normal schedule, including diet,activities, and medicines, unless your health care provider tells you otherwise. This information is not intended to replace advice given to you by your health care provider. Make sure you discuss any questions you have with your health care provider. Document Released: 09/11/2008 Document Revised: 10/01/2015 Document Reviewed: 11/02/2015 Elsevier Interactive Patient Education  2017 [employer].

## 2016-09-08 DIAGNOSIS — E1129 Type 2 diabetes mellitus with other diabetic kidney complication: Secondary | ICD-10-CM | POA: Diagnosis not present

## 2016-09-08 DIAGNOSIS — N179 Acute kidney failure, unspecified: Secondary | ICD-10-CM | POA: Diagnosis not present

## 2016-09-08 DIAGNOSIS — N10 Acute tubulo-interstitial nephritis: Secondary | ICD-10-CM | POA: Diagnosis not present

## 2016-09-08 NOTE — Telephone Encounter (Signed)
Pt was called and told instructions were changed to morning.

## 2016-09-09 LAB — BASIC METABOLIC PANEL
BUN: 52 mg/dL — AB (ref 4–21)
Creatinine: 2.6 mg/dL — AB (ref 0.5–1.1)
Glucose: 431 mg/dL
Potassium: 4.4 mmol/L (ref 3.4–5.3)
Sodium: 136 mmol/L — AB (ref 137–147)

## 2016-09-13 ENCOUNTER — Encounter: Payer: Self-pay | Admitting: Family Medicine

## 2016-09-13 DIAGNOSIS — M545 Low back pain: Secondary | ICD-10-CM | POA: Diagnosis not present

## 2016-09-14 ENCOUNTER — Encounter
Admission: RE | Admit: 2016-09-14 | Discharge: 2016-09-14 | Disposition: A | Discharge status: Home / Self Care | Payer: Medicare Other | Source: Ambulatory Visit | Attending: Cardiology | Admitting: Cardiology

## 2016-09-14 ENCOUNTER — Telehealth: Payer: Self-pay | Admitting: Family Medicine

## 2016-09-14 DIAGNOSIS — R0602 Shortness of breath: Secondary | ICD-10-CM | POA: Diagnosis not present

## 2016-09-14 MED ORDER — REGADENOSON 0.4 MG/5ML IV SOLN
0.4000 mg | Freq: Once | INTRAVENOUS | Status: AC
  Administered 2016-09-14: 0.4 mg via INTRAVENOUS

## 2016-09-14 MED ORDER — TECHNETIUM TC 99M TETROFOSMIN IV KIT
32.5230 | PACK | Freq: Once | INTRAVENOUS | Status: AC | PRN
  Administered 2016-09-14: 32.523 via INTRAVENOUS

## 2016-09-14 MED ORDER — TECHNETIUM TC 99M TETROFOSMIN IV KIT
13.0000 | PACK | Freq: Once | INTRAVENOUS | Status: AC | PRN
  Administered 2016-09-14: 12.974 via INTRAVENOUS

## 2016-09-14 NOTE — Telephone Encounter (Signed)
pts daughter was called and told that drug rep should be here today or tomorrow. Daughter was informed that someone would contact her when samples arrive.

## 2016-09-14 NOTE — Telephone Encounter (Signed)
Pt daughter called requesting more samples of insulin lispro (HUMALOG KWIKPEN) 100 UNIT/ML KiwkPen. Daughter also stated that pt's sugar has been running over 400 a few time because she forgot to take a dosage, and had a brownie, but under control now. Please advise, thank you!  Call pt @ [phone]

## 2016-09-15 DIAGNOSIS — M25572 Pain in left ankle and joints of left foot: Secondary | ICD-10-CM | POA: Diagnosis not present

## 2016-09-15 DIAGNOSIS — M545 Low back pain: Secondary | ICD-10-CM | POA: Diagnosis not present

## 2016-09-15 DIAGNOSIS — M47812 Spondylosis without myelopathy or radiculopathy, cervical region: Secondary | ICD-10-CM | POA: Diagnosis not present

## 2016-09-15 DIAGNOSIS — M25519 Pain in unspecified shoulder: Secondary | ICD-10-CM | POA: Diagnosis not present

## 2016-09-15 DIAGNOSIS — M5136 Other intervertebral disc degeneration, lumbar region: Secondary | ICD-10-CM | POA: Diagnosis not present

## 2016-09-15 DIAGNOSIS — R52 Pain, unspecified: Secondary | ICD-10-CM | POA: Diagnosis not present

## 2016-09-15 DIAGNOSIS — T2101XA Burn of unspecified degree of chest wall, initial encounter: Secondary | ICD-10-CM | POA: Diagnosis not present

## 2016-09-15 DIAGNOSIS — M5032 Other cervical disc degeneration, mid-cervical region, unspecified level: Secondary | ICD-10-CM | POA: Diagnosis not present

## 2016-09-15 DIAGNOSIS — M542 Cervicalgia: Secondary | ICD-10-CM | POA: Diagnosis not present

## 2016-09-15 DIAGNOSIS — M7062 Trochanteric bursitis, left hip: Secondary | ICD-10-CM | POA: Diagnosis not present

## 2016-09-15 DIAGNOSIS — E1165 Type 2 diabetes mellitus with hyperglycemia: Secondary | ICD-10-CM | POA: Diagnosis not present

## 2016-09-15 DIAGNOSIS — M6281 Muscle weakness (generalized): Secondary | ICD-10-CM | POA: Diagnosis not present

## 2016-09-15 LAB — NM MYOCAR MULTI W/SPECT W/WALL MOTION / EF
LV dias vol: 41 mL (ref 46–106)
LV sys vol: 19 mL
Peak HR: 95 {beats}/min
Percent HR: 65 %
Rest HR: 81 {beats}/min
SDS: 0
SRS: 1
SSS: 1
TID: 1.09

## 2016-09-15 NOTE — Telephone Encounter (Signed)
Called and spoke with the daughter, they will come get the one sample after her doctors appt in [place] this afternoon.  Labelled medication in the fridge

## 2016-09-16 DIAGNOSIS — M47816 Spondylosis without myelopathy or radiculopathy, lumbar region: Secondary | ICD-10-CM | POA: Diagnosis not present

## 2016-09-19 ENCOUNTER — Telehealth: Payer: Self-pay | Admitting: *Deleted

## 2016-09-19 ENCOUNTER — Encounter: Payer: Self-pay | Admitting: Family Medicine

## 2016-09-19 ENCOUNTER — Ambulatory Visit (INDEPENDENT_AMBULATORY_CARE_PROVIDER_SITE_OTHER): Payer: Medicare Other | Admitting: Family Medicine

## 2016-09-19 DIAGNOSIS — R0602 Shortness of breath: Secondary | ICD-10-CM | POA: Diagnosis not present

## 2016-09-19 DIAGNOSIS — N184 Chronic kidney disease, stage 4 (severe): Secondary | ICD-10-CM | POA: Diagnosis not present

## 2016-09-19 DIAGNOSIS — N10 Acute tubulo-interstitial nephritis: Secondary | ICD-10-CM | POA: Diagnosis not present

## 2016-09-19 DIAGNOSIS — E1122 Type 2 diabetes mellitus with diabetic chronic kidney disease: Secondary | ICD-10-CM | POA: Diagnosis not present

## 2016-09-19 DIAGNOSIS — N179 Acute kidney failure, unspecified: Secondary | ICD-10-CM | POA: Diagnosis not present

## 2016-09-19 DIAGNOSIS — Z794 Long term (current) use of insulin: Secondary | ICD-10-CM | POA: Diagnosis not present

## 2016-09-19 DIAGNOSIS — I129 Hypertensive chronic kidney disease with stage 1 through stage 4 chronic kidney disease, or unspecified chronic kidney disease: Secondary | ICD-10-CM | POA: Diagnosis not present

## 2016-09-19 DIAGNOSIS — E1129 Type 2 diabetes mellitus with other diabetic kidney complication: Secondary | ICD-10-CM | POA: Diagnosis not present

## 2016-09-19 MED ORDER — INSULIN LISPRO 100 UNIT/ML (KWIKPEN): 12.0000 [IU] | PEN_INJECTOR | Freq: Three times a day (TID) | SUBCUTANEOUS | 0 refills | Status: DC

## 2016-09-19 MED ORDER — BASAGLAR KWIKPEN 100 UNIT/ML ~~LOC~~ SOPN: 25.0000 [IU] | PEN_INJECTOR | Freq: Every day | SUBCUTANEOUS | 0 refills | Status: DC

## 2016-09-19 NOTE — Telephone Encounter (Signed)
See me in 2 weeks

## 2016-09-19 NOTE — Patient Instructions (Signed)
Insulin regimen: Basaglar 25; 04/20/11 Humalog.  Continue your other medications.  Follow up in 2 weeks Merleen Nicely - Pharm D)  Take care  Dr. [name]

## 2016-09-19 NOTE — Telephone Encounter (Signed)
Dr. [name] would like pt to see Merleen Nicely in 2 weeks, however Merleen Nicely will not return until June 18 ,2018  Will it be okay to schedule pt this far out  Pt contact [phone]

## 2016-09-19 NOTE — Progress Notes (Signed)
Subjective:  Patient ID: Kelly [name], female    DOB: 1940/12/13  Age: 76 y.o. MRN: [id number]  CC: Follow up  HPI:  76 year old female presents for follow-up.  DM-2  Sugars still uncontrolled (up to the 300's).  Remains on prednisone (per nephrology).  Compliant with Basaglar 25 units (I just increased via a phone conversation over the weekend) and Humalog 11/15/08.  No hypoglycemia.  Eating a lot of fruit which is likely contributing as well.  No symptoms.  No other complaints or concerns at this time.  SOB  Improved on Lasix.  Has seen cardiology.  Stress test - low risk.  Has upcoming Echo.  Social Hx   Social History   Social History  . Marital status: Married    Spouse name: N/A  . Number of children: N/A  . Years of education: N/A   Social History Main Topics  . Smoking status: Never Smoker  . Smokeless tobacco: Never Used  . Alcohol use No  . Drug use: No  . Sexual activity: Not Currently    Partners: Male   Other Topics Concern  . None   Social History Narrative  . None    Review of Systems  Constitutional: Negative.   Endocrine: Negative.    Objective:  BP 134/72   Pulse 81   Temp 98.6 F (37 C) (Oral)   Wt 149 lb 6 oz (67.8 kg)   SpO2 95%   BMI 25.64 kg/m   BP/Weight 09/19/2016 09/07/2016 01/29/3006  Systolic BP 622 633 -  Diastolic BP 72 64 -  Wt. (Lbs) 149.38 150.75 149  BMI 25.64 25.88 25.58    Physical Exam  Lab Results  Component Value Date   WBC 7.2 08/26/2016   HGB 9.9 (L) 08/26/2016   HCT 30.1 (L) 08/26/2016   PLT 186.0 08/26/2016   GLUCOSE 621 (HH) 08/29/2016   CHOL 100 07/09/2016   TRIG 123 07/09/2016   HDL 17 (L) 07/09/2016   LDLCALC 58 07/09/2016   ALT 102 (H) 08/26/2016   AST 31 08/26/2016   NA 136 (A) 09/09/2016   K 4.4 09/09/2016   CL 94 (L) 08/29/2016   CREATININE 2.6 (A) 09/09/2016   BUN 52 (A) 09/09/2016   CO2 36 (H) 08/29/2016   TSH 0.719 07/09/2016   INR 0.98 07/15/2016   HGBA1C  7.1 (H) 07/09/2016    Assessment & Plan:   Problem List Items Addressed This Visit    SOB (shortness of breath)    Improved. Continue lasix. Will await echo.      DM (diabetes mellitus), type 2 with renal complications ([facility])    Still uncontrolled. Increasing Humalog to 04/20/11. Continue Basaglar 25 units daily.  Follow up in 2 weeks.       Relevant Medications   insulin lispro (HUMALOG KWIKPEN) 100 UNIT/ML KiwkPen   Insulin Glargine (BASAGLAR KWIKPEN) 100 UNIT/ML SOPN      Meds ordered this encounter  Medications  . insulin lispro (HUMALOG KWIKPEN) 100 UNIT/ML KiwkPen    Sig: Inject 0.12 mLs (12 Units total) into the skin 3 (three) times daily before meals.    Dispense:  3 mL    Refill:  0    Order Specific Question:   Lot Number?    Answer:   [id number]    Order Specific Question:   Expiration Date?    Answer:   05/08/2018  . Insulin Glargine (BASAGLAR KWIKPEN) 100 UNIT/ML SOPN    Sig: Inject 0.25 mLs (25 Units  total) into the skin daily.    Dispense:  1 pen    Refill:  0     Follow-up: Return in about 2 weeks (around 10/03/2016).  [place]

## 2016-09-19 NOTE — Assessment & Plan Note (Signed)
Still uncontrolled. Increasing Humalog to 04/20/11. Continue Basaglar 25 units daily.  Follow up in 2 weeks.

## 2016-09-19 NOTE — Telephone Encounter (Signed)
I do not think schedule for new pharmacist has been updated to our clinic. Please advise?

## 2016-09-19 NOTE — Assessment & Plan Note (Signed)
Improved. Continue lasix. Will await echo.

## 2016-09-20 ENCOUNTER — Telehealth: Payer: Self-pay | Admitting: *Deleted

## 2016-09-20 ENCOUNTER — Other Ambulatory Visit: Payer: Self-pay | Admitting: Family Medicine

## 2016-09-20 MED ORDER — CYCLOBENZAPRINE HCL 10 MG PO TABS: 10.0000 mg | ORAL_TABLET | Freq: Three times a day (TID) | ORAL | 0 refills | Status: DC | PRN

## 2016-09-20 NOTE — Telephone Encounter (Signed)
Please schedule pt with Dr.Cook in two weeks.

## 2016-09-20 NOTE — Telephone Encounter (Signed)
I'm so sorry. I forgot to send it in. Rx just sent.

## 2016-09-20 NOTE — Telephone Encounter (Signed)
No new medication listed and no comments of medication in office visit that I can see.

## 2016-09-20 NOTE — Telephone Encounter (Signed)
Pt asking for a refill on Xanax 0.5 mg. Pt taking twice daily as needed.

## 2016-09-20 NOTE — Telephone Encounter (Signed)
Patient was to receive a medication on yesterday for her hands and feet cramping. Pharmacy has not received this. [address] Pt contact [phone]

## 2016-09-20 NOTE — Telephone Encounter (Signed)
Pt called and made aware. rx was sent to mail order it needed to go locally it was resent.

## 2016-09-20 NOTE — Addendum Note (Signed)
Addended by: Carmin Muskrat on: 09/20/2016 10:16 AM   Modules accepted: Orders

## 2016-09-21 ENCOUNTER — Other Ambulatory Visit: Payer: Self-pay | Admitting: Family Medicine

## 2016-09-21 MED ORDER — ALPRAZOLAM ER 0.5 MG PO TB24: 0.5000 mg | ORAL_TABLET | Freq: Two times a day (BID) | ORAL | 0 refills | Status: DC | PRN

## 2016-09-21 MED ORDER — ALPRAZOLAM 0.5 MG PO TABS: 0.5000 mg | ORAL_TABLET | Freq: Two times a day (BID) | ORAL | 0 refills | Status: DC | PRN

## 2016-09-21 NOTE — Telephone Encounter (Signed)
rx faxed

## 2016-09-21 NOTE — Telephone Encounter (Signed)
Why is this not on her list?

## 2016-09-21 NOTE — Telephone Encounter (Signed)
Pt was called and stated that she takes medication for sleep anxiety. Pt states that she takes very rarely.

## 2016-09-30 ENCOUNTER — Telehealth: Payer: Self-pay | Admitting: Family Medicine

## 2016-09-30 DIAGNOSIS — E1129 Type 2 diabetes mellitus with other diabetic kidney complication: Secondary | ICD-10-CM | POA: Diagnosis not present

## 2016-09-30 DIAGNOSIS — N10 Acute tubulo-interstitial nephritis: Secondary | ICD-10-CM | POA: Diagnosis not present

## 2016-09-30 DIAGNOSIS — N184 Chronic kidney disease, stage 4 (severe): Secondary | ICD-10-CM | POA: Diagnosis not present

## 2016-09-30 DIAGNOSIS — N183 Chronic kidney disease, stage 3 (moderate): Secondary | ICD-10-CM | POA: Diagnosis not present

## 2016-09-30 NOTE — Telephone Encounter (Signed)
Pt needs refill on HUMALOG KWIKPEN) 100 UNIT/ML KiwkPen sent to [facility]. Please advise

## 2016-09-30 NOTE — Telephone Encounter (Signed)
Spoke with daughter, 2 samples available to pick up thanks

## 2016-09-30 NOTE — Telephone Encounter (Signed)
Handled, thanks

## 2016-09-30 NOTE — Telephone Encounter (Signed)
Pt lvm on referral line asking for a humalog sample. She states that dr. [name] does not give her a prescription, he gives her samples. She is out. Please cb [phone].

## 2016-10-04 ENCOUNTER — Encounter: Payer: Self-pay | Admitting: Family Medicine

## 2016-10-04 ENCOUNTER — Ambulatory Visit (INDEPENDENT_AMBULATORY_CARE_PROVIDER_SITE_OTHER): Payer: Medicare Other | Admitting: Family Medicine

## 2016-10-04 DIAGNOSIS — E1122 Type 2 diabetes mellitus with diabetic chronic kidney disease: Secondary | ICD-10-CM | POA: Diagnosis not present

## 2016-10-04 DIAGNOSIS — Z794 Long term (current) use of insulin: Secondary | ICD-10-CM

## 2016-10-04 DIAGNOSIS — I10 Essential (primary) hypertension: Secondary | ICD-10-CM | POA: Diagnosis not present

## 2016-10-04 DIAGNOSIS — N184 Chronic kidney disease, stage 4 (severe): Secondary | ICD-10-CM | POA: Diagnosis not present

## 2016-10-04 MED ORDER — BASAGLAR KWIKPEN 100 UNIT/ML ~~LOC~~ SOPN: 30.0000 [IU] | PEN_INJECTOR | Freq: Every day | SUBCUTANEOUS | 0 refills | Status: DC

## 2016-10-04 NOTE — Patient Instructions (Signed)
30 units of basaglar in the am.  Continue with the humalog - 04/20/11.  If you get low before dinner decrease the amount with lunch (8-10).  Follow up in 1 month.  Take care  Dr. [name]

## 2016-10-04 NOTE — Assessment & Plan Note (Signed)
Increasing Basaglar to 30 units. Continue Humalog 04/20/11. Patient advised to decrease her lunch dose if she has low sugar at dinnertime.

## 2016-10-04 NOTE — Assessment & Plan Note (Signed)
Recent decline in renal function per patient report. Lasix and Norvasc has been stopped. Prednisone has been tapered. Continue to follow closely with nephrology. Will await records.

## 2016-10-04 NOTE — Assessment & Plan Note (Signed)
BP elevated today. Lasix and Norvasc has been stopped per nephrology. Advised patient to contact her nephrologist of her blood pressure continues to be elevated.

## 2016-10-04 NOTE — Progress Notes (Signed)
Subjective:  Patient ID: Kelly [name], female    DOB: [birth year]  Age: 76 y.o. MRN: [id number]  CC: Follow up  HPI:  76 year old female with HTN, HLD, DM-2, CKD presents for follow up.  DM-2  Sugars improving but still high.  She has had a few low blood sugars at dinnertime.  She endorses compliance with Basaglar 25 units daily and Humalog 04/20/11.  HTN  Nephrology stopped lasix and Norvasc.  BP elevated today.  CKD  Recent decrease in renal function per patient. I do not yet have her recent note/lab work from nephrology.  She remains on prednisone but this is been tapered down to 15 mg.  Nephrology stopped Lasix and Norvasc.  Social Hx   Social History   Social History  . Marital status: Married    Spouse name: N/A  . Number of children: N/A  . Years of education: N/A   Social History Main Topics  . Smoking status: Never Smoker  . Smokeless tobacco: Never Used  . Alcohol use No  . Drug use: No  . Sexual activity: Not Currently    Partners: Male   Other Topics Concern  . None   Social History Narrative  . None    Review of Systems  Constitutional: Negative.   Cardiovascular: Positive for leg swelling.   Objective:  BP (!) 148/76 (BP Location: Left Arm, Patient Position: Sitting, Cuff Size: Normal)   Pulse 89   Temp 98.8 F (37.1 C) (Oral)   Resp 16   Wt 158 lb 4 oz (71.8 kg)   SpO2 98%   BMI 27.16 kg/m   BP/Weight 10/04/2016 9/37/9024 0/01/7352  Systolic BP 299 242 683  Diastolic BP 76 72 64  Wt. (Lbs) 158.25 149.38 150.75  BMI 27.16 25.64 25.88   Physical Exam  Constitutional: She is oriented to person, place, and time. She appears well-developed. No distress.  Cardiovascular: Normal rate and regular rhythm.   Pulmonary/Chest: Effort normal and breath sounds normal.  Neurological: She is alert and oriented to person, place, and time.  Psychiatric: She has a normal mood and affect.  Vitals reviewed.  Lab Results  Component  Value Date   WBC 7.2 08/26/2016   HGB 9.9 (L) 08/26/2016   HCT 30.1 (L) 08/26/2016   PLT 186.0 08/26/2016   GLUCOSE 621 (HH) 08/29/2016   CHOL 100 07/09/2016   TRIG 123 07/09/2016   HDL 17 (L) 07/09/2016   LDLCALC 58 07/09/2016   ALT 102 (H) 08/26/2016   AST 31 08/26/2016   NA 136 (A) 09/09/2016   K 4.4 09/09/2016   CL 94 (L) 08/29/2016   CREATININE 2.6 (A) 09/09/2016   BUN 52 (A) 09/09/2016   CO2 36 (H) 08/29/2016   TSH 0.719 07/09/2016   INR 0.98 07/15/2016   HGBA1C 7.1 (H) 07/09/2016   Assessment & Plan:   Problem List Items Addressed This Visit    Hypertension    BP elevated today. Lasix and Norvasc has been stopped per nephrology. Advised patient to contact her nephrologist of her blood pressure continues to be elevated.      DM (diabetes mellitus), type 2 with renal complications ([place])    Increasing Basaglar to 30 units. Continue Humalog 04/20/11. Patient advised to decrease her lunch dose if she has low sugar at dinnertime.      Relevant Medications   Insulin Glargine (BASAGLAR KWIKPEN) 100 UNIT/ML SOPN   CKD (chronic kidney disease) stage 4, GFR 15-29 ml/min (HCC)  Recent decline in renal function per patient report. Lasix and Norvasc has been stopped. Prednisone has been tapered. Continue to follow closely with nephrology. Will await records.        Meds ordered this encounter  Medications  . Insulin Glargine (BASAGLAR KWIKPEN) 100 UNIT/ML SOPN    Sig: Inject 0.3 mLs (30 Units total) into the skin daily.    Dispense:  1 pen    Refill:  0   Follow-up: Return in about 1 month (around 11/04/2016).  [place]

## 2016-10-06 DIAGNOSIS — M5136 Other intervertebral disc degeneration, lumbar region: Secondary | ICD-10-CM | POA: Diagnosis not present

## 2016-10-06 DIAGNOSIS — M25519 Pain in unspecified shoulder: Secondary | ICD-10-CM | POA: Diagnosis not present

## 2016-10-06 DIAGNOSIS — R52 Pain, unspecified: Secondary | ICD-10-CM | POA: Diagnosis not present

## 2016-10-06 DIAGNOSIS — M545 Low back pain: Secondary | ICD-10-CM | POA: Diagnosis not present

## 2016-10-06 DIAGNOSIS — M47812 Spondylosis without myelopathy or radiculopathy, cervical region: Secondary | ICD-10-CM | POA: Diagnosis not present

## 2016-10-06 DIAGNOSIS — T2101XA Burn of unspecified degree of chest wall, initial encounter: Secondary | ICD-10-CM | POA: Diagnosis not present

## 2016-10-06 DIAGNOSIS — M7062 Trochanteric bursitis, left hip: Secondary | ICD-10-CM | POA: Diagnosis not present

## 2016-10-06 DIAGNOSIS — M25572 Pain in left ankle and joints of left foot: Secondary | ICD-10-CM | POA: Diagnosis not present

## 2016-10-06 DIAGNOSIS — M542 Cervicalgia: Secondary | ICD-10-CM | POA: Diagnosis not present

## 2016-10-06 DIAGNOSIS — M6281 Muscle weakness (generalized): Secondary | ICD-10-CM | POA: Diagnosis not present

## 2016-10-06 DIAGNOSIS — M5032 Other cervical disc degeneration, mid-cervical region, unspecified level: Secondary | ICD-10-CM | POA: Diagnosis not present

## 2016-10-06 DIAGNOSIS — E1165 Type 2 diabetes mellitus with hyperglycemia: Secondary | ICD-10-CM | POA: Diagnosis not present

## 2016-10-10 ENCOUNTER — Other Ambulatory Visit: Payer: Self-pay

## 2016-10-10 MED ORDER — GABAPENTIN 300 MG PO CAPS: 300.0000 mg | ORAL_CAPSULE | Freq: Every day | ORAL | 1 refills | Status: DC

## 2016-10-11 ENCOUNTER — Other Ambulatory Visit: Payer: Self-pay

## 2016-10-11 ENCOUNTER — Ambulatory Visit (INDEPENDENT_AMBULATORY_CARE_PROVIDER_SITE_OTHER): Payer: Medicare Other

## 2016-10-11 DIAGNOSIS — R0602 Shortness of breath: Secondary | ICD-10-CM | POA: Diagnosis not present

## 2016-10-12 DIAGNOSIS — I517 Cardiomegaly: Secondary | ICD-10-CM | POA: Insufficient documentation

## 2016-10-12 NOTE — Progress Notes (Signed)
Cardiology Office Note  Date:  10/13/2016   ID:  Kelly [name], DOB 01/17/1941, MRN [id number]  PCP:  Coral Spikes, DO   Chief Complaint  Patient presents with  . other    Follow up from [facility]. Meds reviewed by the pt. verbally. "doing well." Pt. took a Lasix 40 mg one day last week due to increase fluid.     HPI:  Kelly [name] is a 76 y.o. female  with a history of shortness of breath  Leg swelling, improved on Lasix And by holding amlodipine Renal failure, acute interstitial nephritis, on prednisone Creatinine 2.6, down from 5 Hypertension Diabetes type 2, glucose 400s on prednisone Who presents for routine follow-up for her shortness of breath, leg swelling  In follow-up today we reviewed both of her recent studies including Stress test 09/14/2016 showing no ischemia Echocardiogram 10/11/2016 normal LV function, no significant valve abnormality, small IVC Moderate LVH.  (Appears mild on my review of images, estimated at 1.4 cm)  Reports that she is currently on prednisone 15 for her kidneys Managed by Dr. Candiss Norse She reports 20 pound weight cane over the past month or so since prednisone was started, easy bruising She's been taking Lasix daily for the past 5 days, leg edema resolved relatively quickly  Other lab work reviewed with her in detail including  hematocrit 30 Long discussion concerning her anemia  Other past medical history reviewed  March 2018, pt developed renal failure, noted on routine blood work. 12 day stay in the hospital, biopsy showed acute interstitial nephritis. Being followed by Dr. Candiss Norse. Since then, had issues with leg swelling for which she takes lasix. She has noticed improvement.  Previous CXR done showed pulmonary edema hence consultation.   PMH:   has a past medical history of Colon polyps; Depression; DM (diabetes mellitus), type 2 with renal complications ([place]); Hyperlipidemia; Hypertension; Kidney stones; and  Squamous cell skin cancer (12/2015).  PSH:    Past Surgical History:  Procedure Laterality Date  . APPENDECTOMY    . CHOLECYSTECTOMY    . COLONOSCOPY    . ESOPHAGOGASTRODUODENOSCOPY    . ESOPHAGOGASTRODUODENOSCOPY (EGD) WITH PROPOFOL N/A 03/23/2016   Procedure: ESOPHAGOGASTRODUODENOSCOPY (EGD) WITH PROPOFOL;  Surgeon: Manya Silvas, MD;  Location: [facility] ENDOSCOPY;  Service: Endoscopy;  Laterality: N/A;  . RECTAL PROLAPSE REPAIR     x2    Current Outpatient Prescriptions  Medication Sig Dispense Refill  . ALPRAZolam (XANAX) 0.5 MG tablet Take 1 tablet (0.5 mg total) by mouth 2 (two) times daily as needed for anxiety. 30 tablet 0  . aspirin EC 81 MG tablet Take 81 mg by mouth daily.    Marland Kitchen atorvastatin (LIPITOR) 10 MG tablet Take 1 tablet (10 mg total) by mouth daily. 90 tablet 1  . calcium citrate-vitamin D (CITRACAL+D) 315-200 MG-UNIT tablet Take 1 tablet by mouth 2 (two) times daily.    . cyclobenzaprine (FLEXERIL) 10 MG tablet Take 1 tablet (10 mg total) by mouth 3 (three) times daily as needed for muscle spasms. 90 tablet 0  . furosemide (LASIX) 40 MG tablet Take 40 mg by mouth daily as needed.    . gabapentin (NEURONTIN) 300 MG capsule Take 1 capsule (300 mg total) by mouth at bedtime. 90 capsule 1  . Insulin Glargine (BASAGLAR KWIKPEN) 100 UNIT/ML SOPN Inject 0.3 mLs (30 Units total) into the skin daily. 1 pen 0  . insulin lispro (HUMALOG KWIKPEN) 100 UNIT/ML KiwkPen Inject 0.12 mLs (12 Units total) into  the skin 3 (three) times daily before meals. 3 mL 0  . PARoxetine (PAXIL-CR) 25 MG 24 hr tablet Take 25 mg by mouth daily.    . predniSONE (DELTASONE) 10 MG tablet Take 20 mg by mouth daily with breakfast.     No current facility-administered medications for this visit.      Allergies:   Prilosec otc [omeprazole magnesium]; Sucralfate; Amoxicillin; and Morphine and related   Social History:  The patient  reports that she has never smoked. She has never used smokeless tobacco.  She reports that she does not drink alcohol or use drugs.   Family History:   family history includes Arthritis in her mother; Heart attack (age of onset: 47) in her father; Lung cancer in her sister; Stroke in her mother, sister, and sister.    Review of Systems: Review of Systems  Constitutional: Positive for malaise/fatigue.  Respiratory: Positive for shortness of breath.   Cardiovascular: Negative.   Gastrointestinal: Negative.   Musculoskeletal: Negative.   Neurological: Negative.   Psychiatric/Behavioral: Negative.   All other systems reviewed and are negative.    PHYSICAL EXAM: VS:  BP 120/86 (BP Location: Left Arm, Patient Position: Sitting, Cuff Size: Normal)   Pulse 92   Ht 5\' 4"  (1.626 m)   Wt 157 lb (71.2 kg)   SpO2 98%   BMI 26.95 kg/m  , BMI Body mass index is 26.95 kg/m. GEN: Well nourished, well developed, in no acute distress  HEENT: normal  Neck: no JVD, carotid bruits, or masses Cardiac: RRR; no murmurs, rubs, or gallops,no edema  Respiratory:  clear to auscultation bilaterally, normal work of breathing GI: soft, nontender, nondistended, + BS MS: no deformity or atrophy  Skin: warm and dry, no rash Neuro:  Strength and sensation are intact Psych: euthymic mood, full affect    Recent Labs: 07/09/2016: TSH 0.719 08/26/2016: ALT 102; Hemoglobin 9.9; Platelets 186.0 09/09/2016: BUN 52; Creatinine 2.6; Potassium 4.4; Sodium 136    Lipid Panel Lab Results  Component Value Date   CHOL 100 07/09/2016   HDL 17 (L) 07/09/2016   LDLCALC 58 07/09/2016   TRIG 123 07/09/2016      Wt Readings from Last 3 Encounters:  10/13/16 157 lb (71.2 kg)  10/04/16 158 lb 4 oz (71.8 kg)  09/19/16 149 lb 6 oz (67.8 kg)       ASSESSMENT AND PLAN:  SOB (shortness of breath) Symptoms have moderated, mild with exertion Suspect secondary to underlying anemia Recent negative stress test, normal ejection fraction  CKD (chronic kidney disease) stage 4, GFR 15-29 ml/min  (HCC) Improved renal function over the past 6 weeks, managed by nephrology On prednisone taper Recommended she take Lasix only as needed for significant ankle swelling  Type 2 diabetes mellitus with stage 4 chronic kidney disease, with long-term current use of insulin (HCC)  dramatically elevated glucose level secondary to prednisone Diet discussed with her Recent medication changes by Dr. [name]  Left ventricular hypertrophy Moderate on recent echocardiogram On the report  Images reviewed by myself in detail  LVH appears mild  Anemia in stage 4 chronic kidney disease ([place]) Recommended she discuss anemia with primary care and renal Unclear if she has had iron studies or a candidate for Procrit  Discussed with her that anemia can contribute to her shortness of breath and ankle swelling  Disposition:   F/U  6 months   Total encounter time more than 45 minutes  Greater than 50% was spent in counseling and  coordination of care with the patient   No orders of the defined types were placed in this encounter.    Signed, Esmond Plants, M.D., Ph.D. 10/13/2016  [place], [place]

## 2016-10-13 ENCOUNTER — Encounter: Payer: Self-pay | Admitting: Cardiovascular Disease

## 2016-10-13 ENCOUNTER — Ambulatory Visit (INDEPENDENT_AMBULATORY_CARE_PROVIDER_SITE_OTHER): Payer: Medicare Other | Admitting: Cardiovascular Disease

## 2016-10-13 VITALS — BP 120/86 | HR 92 | Ht 64.0 in | Wt 157.0 lb

## 2016-10-13 DIAGNOSIS — E1122 Type 2 diabetes mellitus with diabetic chronic kidney disease: Secondary | ICD-10-CM | POA: Diagnosis not present

## 2016-10-13 DIAGNOSIS — I517 Cardiomegaly: Secondary | ICD-10-CM

## 2016-10-13 DIAGNOSIS — N184 Chronic kidney disease, stage 4 (severe): Secondary | ICD-10-CM

## 2016-10-13 DIAGNOSIS — D631 Anemia in chronic kidney disease: Secondary | ICD-10-CM

## 2016-10-13 DIAGNOSIS — Z794 Long term (current) use of insulin: Secondary | ICD-10-CM

## 2016-10-13 DIAGNOSIS — R0602 Shortness of breath: Secondary | ICD-10-CM

## 2016-10-13 NOTE — Patient Instructions (Addendum)
Ask Dr. [name] and Dr. Candiss Norse if you need iron  Ask Dr. Candiss Norse about Epogen shot   Medication Instructions:   Only take lasix for ankle swelling  Labwork:  No new labs needed  Testing/Procedures:  No further testing at this time   I recommend watching educational videos on topics of interest to you at:       [email or web address]  Enter code: [email or web address]    Follow-Up: It was a pleasure seeing you in the office today. Please call us if you have new issues that need to be addressed before your next appt.  [phone]  Your physician wants you to follow-up in: 6 months.  You will receive a reminder letter in the mail two months in advance. If you don't receive a letter, please call our office to schedule the follow-up appointment.  If you need a refill on your cardiac medications before your next appointment, please call your pharmacy.

## 2016-10-17 DIAGNOSIS — N179 Acute kidney failure, unspecified: Secondary | ICD-10-CM | POA: Diagnosis not present

## 2016-10-17 DIAGNOSIS — N184 Chronic kidney disease, stage 4 (severe): Secondary | ICD-10-CM | POA: Diagnosis not present

## 2016-10-17 DIAGNOSIS — I10 Essential (primary) hypertension: Secondary | ICD-10-CM | POA: Diagnosis not present

## 2016-10-17 DIAGNOSIS — M47817 Spondylosis without myelopathy or radiculopathy, lumbosacral region: Secondary | ICD-10-CM | POA: Diagnosis not present

## 2016-10-17 DIAGNOSIS — E1129 Type 2 diabetes mellitus with other diabetic kidney complication: Secondary | ICD-10-CM | POA: Diagnosis not present

## 2016-10-17 DIAGNOSIS — E119 Type 2 diabetes mellitus without complications: Secondary | ICD-10-CM | POA: Diagnosis not present

## 2016-10-19 DIAGNOSIS — M25519 Pain in unspecified shoulder: Secondary | ICD-10-CM | POA: Diagnosis not present

## 2016-10-19 DIAGNOSIS — M6281 Muscle weakness (generalized): Secondary | ICD-10-CM | POA: Diagnosis not present

## 2016-10-19 DIAGNOSIS — E1165 Type 2 diabetes mellitus with hyperglycemia: Secondary | ICD-10-CM | POA: Diagnosis not present

## 2016-10-19 DIAGNOSIS — M7062 Trochanteric bursitis, left hip: Secondary | ICD-10-CM | POA: Diagnosis not present

## 2016-10-19 DIAGNOSIS — T2101XA Burn of unspecified degree of chest wall, initial encounter: Secondary | ICD-10-CM | POA: Diagnosis not present

## 2016-10-19 DIAGNOSIS — M545 Low back pain: Secondary | ICD-10-CM | POA: Diagnosis not present

## 2016-10-19 DIAGNOSIS — M5136 Other intervertebral disc degeneration, lumbar region: Secondary | ICD-10-CM | POA: Diagnosis not present

## 2016-10-19 DIAGNOSIS — R52 Pain, unspecified: Secondary | ICD-10-CM | POA: Diagnosis not present

## 2016-10-19 DIAGNOSIS — M47812 Spondylosis without myelopathy or radiculopathy, cervical region: Secondary | ICD-10-CM | POA: Diagnosis not present

## 2016-10-19 DIAGNOSIS — M542 Cervicalgia: Secondary | ICD-10-CM | POA: Diagnosis not present

## 2016-10-19 DIAGNOSIS — M5032 Other cervical disc degeneration, mid-cervical region, unspecified level: Secondary | ICD-10-CM | POA: Diagnosis not present

## 2016-10-19 DIAGNOSIS — M25572 Pain in left ankle and joints of left foot: Secondary | ICD-10-CM | POA: Diagnosis not present

## 2016-10-26 DIAGNOSIS — M47817 Spondylosis without myelopathy or radiculopathy, lumbosacral region: Secondary | ICD-10-CM | POA: Diagnosis not present

## 2016-10-27 DIAGNOSIS — G4733 Obstructive sleep apnea (adult) (pediatric): Secondary | ICD-10-CM | POA: Diagnosis not present

## 2016-10-31 DIAGNOSIS — E1129 Type 2 diabetes mellitus with other diabetic kidney complication: Secondary | ICD-10-CM | POA: Diagnosis not present

## 2016-10-31 DIAGNOSIS — N184 Chronic kidney disease, stage 4 (severe): Secondary | ICD-10-CM | POA: Diagnosis not present

## 2016-10-31 DIAGNOSIS — N179 Acute kidney failure, unspecified: Secondary | ICD-10-CM | POA: Diagnosis not present

## 2016-10-31 DIAGNOSIS — I10 Essential (primary) hypertension: Secondary | ICD-10-CM | POA: Diagnosis not present

## 2016-11-01 DIAGNOSIS — N179 Acute kidney failure, unspecified: Secondary | ICD-10-CM | POA: Diagnosis not present

## 2016-11-01 DIAGNOSIS — E1129 Type 2 diabetes mellitus with other diabetic kidney complication: Secondary | ICD-10-CM | POA: Diagnosis not present

## 2016-11-01 DIAGNOSIS — N184 Chronic kidney disease, stage 4 (severe): Secondary | ICD-10-CM | POA: Diagnosis not present

## 2016-11-01 DIAGNOSIS — N10 Acute tubulo-interstitial nephritis: Secondary | ICD-10-CM | POA: Diagnosis not present

## 2016-11-02 DIAGNOSIS — N184 Chronic kidney disease, stage 4 (severe): Secondary | ICD-10-CM | POA: Diagnosis not present

## 2016-11-02 DIAGNOSIS — I10 Essential (primary) hypertension: Secondary | ICD-10-CM | POA: Diagnosis not present

## 2016-11-02 DIAGNOSIS — R600 Localized edema: Secondary | ICD-10-CM | POA: Diagnosis not present

## 2016-11-04 ENCOUNTER — Encounter: Payer: Self-pay | Admitting: Family Medicine

## 2016-11-04 ENCOUNTER — Ambulatory Visit (INDEPENDENT_AMBULATORY_CARE_PROVIDER_SITE_OTHER): Payer: Medicare Other | Admitting: Family Medicine

## 2016-11-04 DIAGNOSIS — N184 Chronic kidney disease, stage 4 (severe): Secondary | ICD-10-CM

## 2016-11-04 DIAGNOSIS — D631 Anemia in chronic kidney disease: Secondary | ICD-10-CM | POA: Diagnosis not present

## 2016-11-04 DIAGNOSIS — Z794 Long term (current) use of insulin: Secondary | ICD-10-CM | POA: Diagnosis not present

## 2016-11-04 DIAGNOSIS — E1122 Type 2 diabetes mellitus with diabetic chronic kidney disease: Secondary | ICD-10-CM

## 2016-11-04 DIAGNOSIS — D649 Anemia, unspecified: Secondary | ICD-10-CM | POA: Insufficient documentation

## 2016-11-04 MED ORDER — LANCETS MISC: 11 refills | Status: AC

## 2016-11-04 MED ORDER — GLUCOSE BLOOD VI STRP: ORAL_STRIP | 12 refills | Status: DC

## 2016-11-04 NOTE — Assessment & Plan Note (Signed)
Blood sugars improving now that she is off of steroids per Decreasing Basaglar to 8 units daily. Stopping bolus insulin. Follow-up in 2 weeks.

## 2016-11-04 NOTE — Assessment & Plan Note (Signed)
Stable/has been improving. Continues to follow with nephrology. Continue to avoid NSAIDs and nephrotoxins.

## 2016-11-04 NOTE — Patient Instructions (Signed)
Follow up in 2 weeks.  We will call about the insulin later today.  Hold for now.  Take care  Dr. [name]

## 2016-11-04 NOTE — Assessment & Plan Note (Signed)
Patient's anemia is actually improving. Her most recent hemoglobin was 10.9 per labs from nephrology. Patient wants to wait on having a blood draw today. We'll draw iron studies at next appointment. We will continue to monitor her anemia closely. Nephrology managing predominantly in the setting of chronic kidney disease.

## 2016-11-04 NOTE — Progress Notes (Signed)
Subjective:  Patient ID: Kelly [name], female    DOB: June 23, 1940  Age: 76 y.o. MRN: [id number]  CC: Follow up   HPI:  76 year old female with DM 2, CKD stage IV following hospital admission for acute renal failure, HLD, OSA, HTN presents for follow up.  DM-2  Is now off steroids. Her sugars have been dropping dramatically.  She has not taken insulin for the past few days secondary to lower blood sugars. Fasting this morning was 179.  She would like to discuss this today.  CKD  Patient's most recent creatinine was 2.05 on 6/25.  She continues to follow with nephrology.  Anemia  In setting of acute on chronic kidney disease.  Improving.  Most recent hemoglobin was 10.9.  I have no records of iron studies.  She has not started any medication regarding this in relation to her kidney disease. Will discuss today.  Social Hx   Social History   Social History  . Marital status: Married    Spouse name: N/A  . Number of children: N/A  . Years of education: N/A   Social History Main Topics  . Smoking status: Never Smoker  . Smokeless tobacco: Never Used  . Alcohol use No  . Drug use: No  . Sexual activity: Not Currently    Partners: Male   Other Topics Concern  . None   Social History Narrative  . None    Review of Systems  Constitutional: Negative.   Respiratory: Positive for shortness of breath.    Objective:  BP (!) 148/66 (BP Location: Left Arm, Patient Position: Sitting)   Pulse 83   Temp 98.7 F (37.1 C) (Oral)   Wt 159 lb 4 oz (72.2 kg)   SpO2 96%   BMI 27.34 kg/m   BP/Weight 11/04/2016 10/13/2016 05/27/4172  Systolic BP 081 448 185  Diastolic BP 66 86 76  Wt. (Lbs) 159.25 157 158.25  BMI 27.34 26.95 27.16   Physical Exam  Constitutional: She is oriented to person, place, and time. She appears well-developed. No distress.  Cardiovascular: Normal rate and regular rhythm.   Pulmonary/Chest: Effort normal and breath sounds normal. She  has no wheezes. She has no rales.  Neurological: She is alert and oriented to person, place, and time.  Psychiatric:  Flat affect.  Vitals reviewed.  Lab Results  Component Value Date   WBC 7.2 08/26/2016   HGB 9.9 (L) 08/26/2016   HCT 30.1 (L) 08/26/2016   PLT 186.0 08/26/2016   GLUCOSE 621 (HH) 08/29/2016   CHOL 100 07/09/2016   TRIG 123 07/09/2016   HDL 17 (L) 07/09/2016   LDLCALC 58 07/09/2016   ALT 102 (H) 08/26/2016   AST 31 08/26/2016   NA 136 (A) 09/09/2016   K 4.4 09/09/2016   CL 94 (L) 08/29/2016   CREATININE 2.6 (A) 09/09/2016   BUN 52 (A) 09/09/2016   CO2 36 (H) 08/29/2016   TSH 0.719 07/09/2016   INR 0.98 07/15/2016   HGBA1C 7.1 (H) 07/09/2016    Assessment & Plan:   Problem List Items Addressed This Visit      Endocrine   DM (diabetes mellitus), type 2 with renal complications ([place])    Blood sugars improving now that she is off of steroids per Decreasing Basaglar to 8 units daily. Stopping bolus insulin. Follow-up in 2 weeks.        Genitourinary   CKD (chronic kidney disease) stage 4, GFR 15-29 ml/min (HCC)    Stable/has been  improving. Continues to follow with nephrology. Continue to avoid NSAIDs and nephrotoxins.        Other   Anemia    Patient's anemia is actually improving. Her most recent hemoglobin was 10.9 per labs from nephrology. Patient wants to wait on having a blood draw today. We'll draw iron studies at next appointment. We will continue to monitor her anemia closely. Nephrology managing predominantly in the setting of chronic kidney disease.         Meds ordered this encounter  Medications  . glucose blood (IGLUCOSE TEST STRIPS) test strip    Sig: Use as instructed    Dispense:  100 each    Refill:  12    Dispense based on patient's meter and insurance.  . Lancets MISC    Sig: Use up to 4 times daily to check blood sugars.    Dispense:  200 each    Refill:  11    Dispense based on insurance and patient preference.     Follow-up: Return in about 2 weeks (around 11/18/2016).  [place]

## 2016-11-07 DIAGNOSIS — M5136 Other intervertebral disc degeneration, lumbar region: Secondary | ICD-10-CM | POA: Diagnosis not present

## 2016-11-07 DIAGNOSIS — M6281 Muscle weakness (generalized): Secondary | ICD-10-CM | POA: Diagnosis not present

## 2016-11-07 DIAGNOSIS — M542 Cervicalgia: Secondary | ICD-10-CM | POA: Diagnosis not present

## 2016-11-07 DIAGNOSIS — E1165 Type 2 diabetes mellitus with hyperglycemia: Secondary | ICD-10-CM | POA: Diagnosis not present

## 2016-11-07 DIAGNOSIS — T2101XA Burn of unspecified degree of chest wall, initial encounter: Secondary | ICD-10-CM | POA: Diagnosis not present

## 2016-11-07 DIAGNOSIS — M47812 Spondylosis without myelopathy or radiculopathy, cervical region: Secondary | ICD-10-CM | POA: Diagnosis not present

## 2016-11-07 DIAGNOSIS — M25572 Pain in left ankle and joints of left foot: Secondary | ICD-10-CM | POA: Diagnosis not present

## 2016-11-07 DIAGNOSIS — M4317 Spondylolisthesis, lumbosacral region: Secondary | ICD-10-CM | POA: Diagnosis not present

## 2016-11-07 DIAGNOSIS — M545 Low back pain: Secondary | ICD-10-CM | POA: Diagnosis not present

## 2016-11-07 DIAGNOSIS — R52 Pain, unspecified: Secondary | ICD-10-CM | POA: Diagnosis not present

## 2016-11-07 DIAGNOSIS — M25519 Pain in unspecified shoulder: Secondary | ICD-10-CM | POA: Diagnosis not present

## 2016-11-07 DIAGNOSIS — M4316 Spondylolisthesis, lumbar region: Secondary | ICD-10-CM | POA: Diagnosis not present

## 2016-11-07 DIAGNOSIS — M7062 Trochanteric bursitis, left hip: Secondary | ICD-10-CM | POA: Diagnosis not present

## 2016-11-07 DIAGNOSIS — M5032 Other cervical disc degeneration, mid-cervical region, unspecified level: Secondary | ICD-10-CM | POA: Diagnosis not present

## 2016-11-18 ENCOUNTER — Encounter: Payer: Self-pay | Admitting: Family Medicine

## 2016-11-18 ENCOUNTER — Ambulatory Visit (INDEPENDENT_AMBULATORY_CARE_PROVIDER_SITE_OTHER): Payer: Medicare Other | Admitting: Family Medicine

## 2016-11-18 ENCOUNTER — Other Ambulatory Visit: Payer: Self-pay

## 2016-11-18 VITALS — BP 142/62 | HR 86 | Temp 98.6°F | Resp 16 | Ht 64.0 in | Wt 156.4 lb

## 2016-11-18 DIAGNOSIS — D631 Anemia in chronic kidney disease: Secondary | ICD-10-CM

## 2016-11-18 DIAGNOSIS — N184 Chronic kidney disease, stage 4 (severe): Secondary | ICD-10-CM | POA: Diagnosis not present

## 2016-11-18 DIAGNOSIS — Z794 Long term (current) use of insulin: Secondary | ICD-10-CM

## 2016-11-18 DIAGNOSIS — E1122 Type 2 diabetes mellitus with diabetic chronic kidney disease: Secondary | ICD-10-CM

## 2016-11-18 LAB — COMPREHENSIVE METABOLIC PANEL
ALT: 15 U/L (ref 0–35)
AST: 16 U/L (ref 0–37)
Albumin: 3.5 g/dL (ref 3.5–5.2)
Alkaline Phosphatase: 64 U/L (ref 39–117)
BUN: 20 mg/dL (ref 6–23)
CO2: 30 mEq/L (ref 19–32)
Calcium: 9.1 mg/dL (ref 8.4–10.5)
Chloride: 106 mEq/L (ref 96–112)
Creatinine, Ser: 2.17 mg/dL — ABNORMAL HIGH (ref 0.40–1.20)
GFR: 23.47 mL/min — ABNORMAL LOW (ref >60.00)
Glucose, Bld: 267 mg/dL — ABNORMAL HIGH (ref 70–99)
Potassium: 3.9 mEq/L (ref 3.5–5.1)
Sodium: 142 mEq/L (ref 135–145)
Total Bilirubin: 0.5 mg/dL (ref 0.2–1.2)
Total Protein: 6.3 g/dL (ref 6.0–8.3)

## 2016-11-18 LAB — CBC
HCT: 35.8 % — ABNORMAL LOW (ref 36.0–46.0)
Hemoglobin: 12 g/dL (ref 12.0–15.0)
MCHC: 33.4 g/dL (ref 30.0–36.0)
MCV: 93.1 fl (ref 78.0–100.0)
Platelets: 180 10*3/uL (ref 150.0–400.0)
RBC: 3.84 Mil/uL — ABNORMAL LOW (ref 3.87–5.11)
RDW: 14.5 % (ref 11.5–15.5)
WBC: 6.2 10*3/uL (ref 4.0–10.5)

## 2016-11-18 LAB — HEMOGLOBIN A1C: Hgb A1c MFr Bld: 8.8 % — ABNORMAL HIGH (ref 4.6–6.5)

## 2016-11-18 NOTE — Assessment & Plan Note (Signed)
Renal function improving. Creatinine 2.17 today. We'll continue to monitor closely.

## 2016-11-18 NOTE — Progress Notes (Signed)
Subjective:  Patient ID: Kelly [name], female    DOB: Dec 29, 1940  Age: 76 y.o. MRN: [id number]  CC: Follow up  HPI:  76 year old female with DM 2, CKD stage IV following hospital admission for acute renal failure, HLD, OSA, HTN presents for follow up.  DM-2  Sugars well controlled. She is now down to [employer] 10 units daily.  Sugars in the 120's fasting.  No hypoglycemia.  She was previously on Victoza. Will discuss restarting today.  Anemia  Secondary to CKD.  Needs repeat labs today.  CKD   Creatinine has been stable.  Following with Nephrology.  Off steroids.  Checking creatinine today.   Social Hx    Social History   Social History  . Marital status: Married    Spouse name: N/A  . Number of children: N/A  . Years of education: N/A   Social History Main Topics  . Smoking status: Never Smoker  . Smokeless tobacco: Never Used  . Alcohol use No  . Drug use: No  . Sexual activity: Not Currently    Partners: Male   Other Topics Concern  . None   Social History Narrative  . None    Review of Systems  Constitutional: Negative.   Endocrine: Negative.    Objective:  BP (!) 142/62   Pulse 86   Temp 98.6 F (37 C) (Oral)   Resp 16   Ht 5\' 4"  (1.626 m)   Wt 156 lb 6 oz (70.9 kg)   SpO2 95%   BMI 26.84 kg/m   BP/Weight 11/18/2016 08/15/8117 05/13/7827  Systolic BP 562 130 865  Diastolic BP 62 66 86  Wt. (Lbs) 156.38 159.25 157  BMI 26.84 27.34 26.95    Physical Exam  Constitutional: She is oriented to person, place, and time. She appears well-developed. No distress.  Cardiovascular: Normal rate and regular rhythm.   Pulmonary/Chest: Effort normal and breath sounds normal. She has no wheezes. She has no rales.  Neurological: She is alert and oriented to person, place, and time.  Psychiatric: She has a normal mood and affect.  Vitals reviewed.   Lab Results  Component Value Date   WBC 6.2 11/18/2016   HGB 12.0 11/18/2016   HCT  35.8 (L) 11/18/2016   PLT 180.0 11/18/2016   GLUCOSE 267 (H) 11/18/2016   CHOL 100 07/09/2016   TRIG 123 07/09/2016   HDL 17 (L) 07/09/2016   LDLCALC 58 07/09/2016   ALT 15 11/18/2016   AST 16 11/18/2016   NA 142 11/18/2016   K 3.9 11/18/2016   CL 106 11/18/2016   CREATININE 2.17 (H) 11/18/2016   BUN 20 11/18/2016   CO2 30 11/18/2016   TSH 0.719 07/09/2016   INR 0.98 07/15/2016   HGBA1C 8.8 (H) 11/18/2016    Assessment & Plan:   Problem List Items Addressed This Visit      Endocrine   DM (diabetes mellitus), type 2 with renal complications ([place]) - Primary    A1c 8.8 today. This is due to elevated sugars previously while on steroids. Sugars doing well at this time. Stopping insulin and starting Victoza.      Relevant Orders   Hemoglobin A1c (Completed)     Genitourinary   CKD (chronic kidney disease) stage 4, GFR 15-29 ml/min (HCC)    Renal function improving. Creatinine 2.17 today. We'll continue to monitor closely.      Relevant Orders   CBC (Completed)   Iron, TIBC and Ferritin Panel  Comprehensive metabolic panel (Completed)     Other   Anemia    Now resolved. Hemoglobin 12.0 today. We'll continue to monitor closely.         Follow-up: 1 month  [place]

## 2016-11-18 NOTE — Assessment & Plan Note (Signed)
A1c 8.8 today. This is due to elevated sugars previously while on steroids. Sugars doing well at this time. Stopping insulin and starting Victoza.

## 2016-11-18 NOTE — Patient Instructions (Addendum)
Victoza 0.6 mg daily x 1 week, then increase to 1.2 mg daily.   Follow up in 1 month.  Take care  Dr. [name]

## 2016-11-18 NOTE — Assessment & Plan Note (Signed)
Now resolved. Hemoglobin 12.0 today. We'll continue to monitor closely.

## 2016-11-19 LAB — IRON,TIBC AND FERRITIN PANEL
%SAT: 19 % (ref 11–50)
Ferritin: 34 ng/mL (ref 20–288)
Iron: 63 ug/dL (ref 45–160)
TIBC: 329 ug/dL (ref 250–450)

## 2016-11-23 ENCOUNTER — Other Ambulatory Visit: Payer: Self-pay

## 2016-11-23 DIAGNOSIS — M4317 Spondylolisthesis, lumbosacral region: Secondary | ICD-10-CM | POA: Diagnosis not present

## 2016-11-23 DIAGNOSIS — M4316 Spondylolisthesis, lumbar region: Secondary | ICD-10-CM | POA: Diagnosis not present

## 2016-11-23 DIAGNOSIS — M545 Low back pain: Secondary | ICD-10-CM | POA: Diagnosis not present

## 2016-12-13 DIAGNOSIS — D0421 Carcinoma in situ of skin of right ear and external auricular canal: Secondary | ICD-10-CM | POA: Diagnosis not present

## 2016-12-13 DIAGNOSIS — Z85828 Personal history of other malignant neoplasm of skin: Secondary | ICD-10-CM | POA: Diagnosis not present

## 2016-12-13 DIAGNOSIS — L570 Actinic keratosis: Secondary | ICD-10-CM | POA: Diagnosis not present

## 2016-12-13 DIAGNOSIS — D485 Neoplasm of uncertain behavior of skin: Secondary | ICD-10-CM | POA: Diagnosis not present

## 2016-12-13 DIAGNOSIS — D0472 Carcinoma in situ of skin of left lower limb, including hip: Secondary | ICD-10-CM | POA: Diagnosis not present

## 2016-12-13 DIAGNOSIS — Z08 Encounter for follow-up examination after completed treatment for malignant neoplasm: Secondary | ICD-10-CM | POA: Diagnosis not present

## 2016-12-13 DIAGNOSIS — X32XXXA Exposure to sunlight, initial encounter: Secondary | ICD-10-CM | POA: Diagnosis not present

## 2016-12-13 DIAGNOSIS — L821 Other seborrheic keratosis: Secondary | ICD-10-CM | POA: Diagnosis not present

## 2016-12-20 ENCOUNTER — Encounter: Payer: Self-pay | Admitting: Family Medicine

## 2016-12-20 ENCOUNTER — Ambulatory Visit (INDEPENDENT_AMBULATORY_CARE_PROVIDER_SITE_OTHER): Payer: Medicare Other | Admitting: Family Medicine

## 2016-12-20 DIAGNOSIS — E1122 Type 2 diabetes mellitus with diabetic chronic kidney disease: Secondary | ICD-10-CM | POA: Diagnosis not present

## 2016-12-20 DIAGNOSIS — Z794 Long term (current) use of insulin: Secondary | ICD-10-CM | POA: Diagnosis not present

## 2016-12-20 DIAGNOSIS — N184 Chronic kidney disease, stage 4 (severe): Secondary | ICD-10-CM | POA: Diagnosis not present

## 2016-12-20 MED ORDER — INSULIN DEGLUDEC 100 UNIT/ML ~~LOC~~ SOPN: 5.0000 [IU] | PEN_INJECTOR | Freq: Every day | SUBCUTANEOUS | 0 refills | Status: DC

## 2016-12-20 NOTE — Assessment & Plan Note (Signed)
Worsening. Continue Victoza. Restarting insulin. Tresiba 5 units daily (titrate 1-2 units every 3-4 days). Follow up in 2 weeks.

## 2016-12-20 NOTE — Patient Instructions (Signed)
Tresiba 5 units daily. Increase by 1-2 units every 3-4 days. Goal fasting is less than 150.  Follow up in 2 weeks with Pharm D  Take care  Dr. [name]

## 2016-12-20 NOTE — Progress Notes (Signed)
Subjective:  Patient ID: Kelly [name], female    DOB: [birth year]  Age: 76 y.o. MRN: [id number]  CC: Follow up DM-23  HPI:  76 year old female with type 2 diabetes, hypertension, hyperlipidemia, CKD stage IV presents for follow-up regarding her diabetes.  DM  Blood sugars readings - fastings from 154 - 206. Blood sugars have been rising since cessation of insulin.  Hypoglycemia - No.  Medications - Victoza 1.2 mg daily.  Adverse effects - Daily.  Compliance - Yes.  Preventative care  Eye exam -  In need of. Is going to schedule.  Foot exam - Needs today.  Last A1C - Up to date.  Urine microalbumin - Up to date.   Social Hx   Social History   Social History  . Marital status: Married    Spouse name: N/A  . Number of children: N/A  . Years of education: N/A   Social History Main Topics  . Smoking status: Never Smoker  . Smokeless tobacco: Never Used  . Alcohol use No  . Drug use: No  . Sexual activity: Not Currently    Partners: Male   Other Topics Concern  . None   Social History Narrative  . None    Review of Systems  Constitutional: Negative.   Endocrine: Negative.    Objective:  BP 120/78   Pulse 77   Temp 98.7 F (37.1 C)   Wt 150 lb 3.2 oz (68.1 kg)   SpO2 97%   BMI 25.78 kg/m   BP/Weight 12/20/2016 11/18/2016 7/42/5956  Systolic BP 387 564 332  Diastolic BP 78 62 66  Wt. (Lbs) 150.2 156.38 159.25  BMI 25.78 26.84 27.34    Physical Exam  Constitutional: She is oriented to person, place, and time. She appears well-developed. No distress.  Cardiovascular: Normal rate and regular rhythm.   No murmur heard. Pulmonary/Chest: Effort normal. She has no wheezes. She has no rales.  Neurological: She is alert and oriented to person, place, and time.  Psychiatric: She has a normal mood and affect.  Vitals reviewed.  Lab Results  Component Value Date   WBC 6.2 11/18/2016   HGB 12.0 11/18/2016   HCT 35.8 (L) 11/18/2016   PLT 180.0  11/18/2016   GLUCOSE 267 (H) 11/18/2016   CHOL 100 07/09/2016   TRIG 123 07/09/2016   HDL 17 (L) 07/09/2016   LDLCALC 58 07/09/2016   ALT 15 11/18/2016   AST 16 11/18/2016   NA 142 11/18/2016   K 3.9 11/18/2016   CL 106 11/18/2016   CREATININE 2.17 (H) 11/18/2016   BUN 20 11/18/2016   CO2 30 11/18/2016   TSH 0.719 07/09/2016   INR 0.98 07/15/2016   HGBA1C 8.8 (H) 11/18/2016    Assessment & Plan:   Problem List Items Addressed This Visit    DM (diabetes mellitus), type 2 with renal complications ([place])    Worsening. Continue Victoza. Restarting insulin. Tresiba 5 units daily (titrate 1-2 units every 3-4 days). Follow up in 2 weeks.       Relevant Medications   liraglutide 18 MG/3ML SOPN   insulin degludec (TRESIBA) 100 UNIT/ML SOPN FlexTouch Pen      Meds ordered this encounter  Medications  . DISCONTD: liraglutide (VICTOZA) 18 MG/3ML SOPN    Sig: Inject 1.2 mg into the skin daily.   Marland Kitchen liraglutide 18 MG/3ML SOPN    Sig: Inject 1.2 mg into the skin daily.  . insulin degludec (TRESIBA) 100 UNIT/ML SOPN FlexTouch Pen  Sig: Inject 0.05 mLs (5 Units total) into the skin daily.    Dispense:  3 mL    Refill:  0   Follow-up: Return in about 2 weeks (around 01/03/2017).  [place]

## 2016-12-28 DIAGNOSIS — M542 Cervicalgia: Secondary | ICD-10-CM | POA: Diagnosis not present

## 2016-12-28 DIAGNOSIS — T2101XA Burn of unspecified degree of chest wall, initial encounter: Secondary | ICD-10-CM | POA: Diagnosis not present

## 2016-12-28 DIAGNOSIS — R52 Pain, unspecified: Secondary | ICD-10-CM | POA: Diagnosis not present

## 2016-12-28 DIAGNOSIS — M5032 Other cervical disc degeneration, mid-cervical region, unspecified level: Secondary | ICD-10-CM | POA: Diagnosis not present

## 2016-12-28 DIAGNOSIS — M25519 Pain in unspecified shoulder: Secondary | ICD-10-CM | POA: Diagnosis not present

## 2016-12-28 DIAGNOSIS — E1165 Type 2 diabetes mellitus with hyperglycemia: Secondary | ICD-10-CM | POA: Diagnosis not present

## 2016-12-28 DIAGNOSIS — M545 Low back pain: Secondary | ICD-10-CM | POA: Diagnosis not present

## 2016-12-28 DIAGNOSIS — M6281 Muscle weakness (generalized): Secondary | ICD-10-CM | POA: Diagnosis not present

## 2016-12-28 DIAGNOSIS — M47812 Spondylosis without myelopathy or radiculopathy, cervical region: Secondary | ICD-10-CM | POA: Diagnosis not present

## 2016-12-28 DIAGNOSIS — M7062 Trochanteric bursitis, left hip: Secondary | ICD-10-CM | POA: Diagnosis not present

## 2016-12-28 DIAGNOSIS — M5136 Other intervertebral disc degeneration, lumbar region: Secondary | ICD-10-CM | POA: Diagnosis not present

## 2016-12-28 DIAGNOSIS — M25572 Pain in left ankle and joints of left foot: Secondary | ICD-10-CM | POA: Diagnosis not present

## 2016-12-29 DIAGNOSIS — N10 Acute tubulo-interstitial nephritis: Secondary | ICD-10-CM | POA: Diagnosis not present

## 2016-12-29 DIAGNOSIS — N184 Chronic kidney disease, stage 4 (severe): Secondary | ICD-10-CM | POA: Diagnosis not present

## 2016-12-29 DIAGNOSIS — N179 Acute kidney failure, unspecified: Secondary | ICD-10-CM | POA: Diagnosis not present

## 2016-12-29 DIAGNOSIS — E1129 Type 2 diabetes mellitus with other diabetic kidney complication: Secondary | ICD-10-CM | POA: Diagnosis not present

## 2017-01-02 ENCOUNTER — Encounter: Payer: Self-pay | Admitting: Pharmacist

## 2017-01-02 ENCOUNTER — Ambulatory Visit (INDEPENDENT_AMBULATORY_CARE_PROVIDER_SITE_OTHER): Payer: Medicare Other | Admitting: Pharmacist

## 2017-01-02 VITALS — BP 170/105 | HR 69 | Wt 151.4 lb

## 2017-01-02 DIAGNOSIS — Z794 Long term (current) use of insulin: Secondary | ICD-10-CM | POA: Diagnosis not present

## 2017-01-02 DIAGNOSIS — N184 Chronic kidney disease, stage 4 (severe): Secondary | ICD-10-CM | POA: Diagnosis not present

## 2017-01-02 DIAGNOSIS — E1122 Type 2 diabetes mellitus with diabetic chronic kidney disease: Secondary | ICD-10-CM

## 2017-01-02 DIAGNOSIS — I10 Essential (primary) hypertension: Secondary | ICD-10-CM

## 2017-01-02 DIAGNOSIS — N10 Acute tubulo-interstitial nephritis: Secondary | ICD-10-CM | POA: Diagnosis not present

## 2017-01-02 DIAGNOSIS — E1129 Type 2 diabetes mellitus with other diabetic kidney complication: Secondary | ICD-10-CM | POA: Diagnosis not present

## 2017-01-02 MED ORDER — PEN NEEDLES 32G X 4 MM MISC: 100.0000 | Freq: Every day | 3 refills | Status: DC

## 2017-01-02 MED ORDER — BASAGLAR KWIKPEN 100 UNIT/ML ~~LOC~~ SOPN: 8.0000 [IU] | PEN_INJECTOR | Freq: Every day | SUBCUTANEOUS | 3 refills | Status: DC

## 2017-01-02 NOTE — Progress Notes (Signed)
Care was provided under my supervision. I agree with the management as indicated in the note.  Laquita Harlan DO  

## 2017-01-02 NOTE — Assessment & Plan Note (Signed)
Hypertension longstanding currently uncontrolled with BP today 170/105 mmHg.  Patient reports adherence with medication. Control is suboptimal due to volume overload 2/2 CKD -Agree with nephrologist and recommended pt to take furosemide today, and continue to take it tomorrow if legs are still swollen.  -Will call patient tomorrow to follow up -Consider starting carvedilol 3.125 mg BID in the future if BP remains above goal

## 2017-01-02 NOTE — Patient Instructions (Addendum)
Thanks for coming to see [place] today!   1. Take out apple juice from your diet - work on using the Plate Method to plan your meals. Eat carbohydrates in moderation.   2. Change Tresiba to Basaglar at 8 units a day. Continue Victoza 1.2 mg once a day  3. Test your blood sugar first thing in the morning and any time you feel low. You can test a few times a week 2hrs after you have eaten a meal.   4. Take your furosmide today. If you are still swollen tomorrow, take it again. I will call you to check on you. If your blood pressure remains elevated, we will consider changing your blood pressure medication.   5. Come back to see [place] in 2 weeks.   My number is [phone]

## 2017-01-02 NOTE — Assessment & Plan Note (Signed)
Diabetes longstanding currently uncontrolled with last A1C 8.8% (11/18/2016).  Patient denies hypoglycemic events and is able to verbalize appropriate hypoglycemia management plan. Patient reports adherence with medication. Control is suboptimal due to dietary indiscretion and sedentary lifestyle. Fasting BG has improved since the initiation of Tresiba. Therapeutic options and diet changes are limited due to concurrent Stage 4 CKD.  Following discussion and approval by Dr. Caryl Bis, the following medication changes were made:  -Discontinue Tresiba 7 units daily -Start Basaglar 8 units daily - has coupon per patient -Continue Victoza 1.2mg  daily -Counseled extensively on the plate method and diabetic diet with concurrent CKD -Next A1C anticipated 02/2017.

## 2017-01-02 NOTE — Progress Notes (Signed)
S:     Chief Complaint  Patient presents with  . Medication Management    Diabetes    Patient arrives ambulating without assistance, presents with daughter, Marcie Bal. Presents for diabetes evaluation, education, and management at the request of Dr. [name]. Patient was referred on 12/20/2016.  Patient was last seen by Primary Care Provider on 12/20/2016.    Patient reports recent headaches and constipation since re-initiation of insulin Tyler Aas). States she tolerated Basalgar well in the past, and expressed interest to switch from [place] to [employer].  Of note, recent discontinuation of amlodpine due to lower extremity edema per notes. Went to nephrologist this AM - States that her BP was elevated there and nephrologist told her to take lasix.   Patient reports adherence with medications.  Current diabetes medications include: Victoza 1.2 mg daily, Tresiba 7 units daily  Current hypertension medications include: furosemide 40 mg PRN  Patient denies hypoglycemic events.  Patient reported dietary habits: Eats 2 meals/day. Usually skips lunch. Breakfast: oatmeal Lunch: leftover from supper (tilapia, roasted squash and zucchini), PB&J, tomato sandwiches Dinner: fried shiimp, french fries  Snacks: not normally Drinks: apple juice (16 oz - usually with breakfast or as a snack), water  Patient reported exercise habits: limited by back pain; reports some back exercises recently   Patient reports nocturia. 2x a night. No painful urination Patient denies neuropathy.  Patient reports visual changes. Reports cataracts. Patient reports self foot exams. No issues  O:  Physical Exam  Constitutional: She appears well-developed and well-nourished.  Musculoskeletal: She exhibits edema.  1+ pitting edema in both legs  Review of Systems  All other systems reviewed and are negative.  Lab Results  Component Value Date   HGBA1C 8.8 (H) 11/18/2016   Vitals:   01/02/17 1301  BP: (!) 170/105    Pulse: 69   Home fasting CBG: 150-180s since Tresiba initiation, with excursions to 116 and 219 2 hour post-prandial CBG: 219 (breakfast), 236 (dinner) Bedtime CBG: 180-220s, with excursions to 107 to 245  10 year ASCVD risk: 53.5% (assuming minimum requirement to use calculator= TC 130 and HDL 20 )  A/P: Diabetes longstanding currently uncontrolled with last A1C 8.8% (11/18/2016).  Patient denies hypoglycemic events and is able to verbalize appropriate hypoglycemia management plan. Patient reports adherence with medication. Control is suboptimal due to dietary indiscretion and sedentary lifestyle. Fasting BG has improved since the initiation of Tresiba. Therapeutic options and diet changes are limited due to concurrent Stage 4 CKD.  Following discussion and approval by Dr. Caryl Bis, the following medication changes were made:  -Discontinue Tresiba 7 units daily -Start Basaglar 8 units daily - has coupon per patient -Continue Victoza 1.2mg  daily -Counseled extensively on the plate method and diabetic diet with concurrent CKD -Next A1C anticipated 02/2017.    ASCVD risk greater than 7.5%. Continued Aspirin 81 mg and Continued atorvastatin 10 mg.  -Given age, lipid panel, patient is appropriately on a moderate-intensity statin.   Hypertension longstanding currently uncontrolled with BP today 170/105 mmHg.  Patient reports adherence with medication. Control is suboptimal due to volume overload 2/2 CKD -Agree with nephrologist and recommended pt to take furosemide today, and continue to take it tomorrow if legs are still swollen.  -Will call patient tomorrow to follow up -Consider starting carvedilol 3.125 mg BID in the future if BP remains above goal   Patient was seen with Dr. Caryl Bis today in clinic and medication changes were discussed and approved prior to initiation Written patient instructions  provided.  Total time in face to face counseling 40 minutes.   Follow up in Pharmacist  Clinic Visit in 2 weeks.   Patient seen with Shelle Iron, PharmD E[place], Pharm.D. PGY2 Ambulatory Care Pharmacy Resident Phone: [phone]

## 2017-01-03 ENCOUNTER — Telehealth: Payer: Self-pay | Admitting: Pharmacist

## 2017-01-03 NOTE — Telephone Encounter (Signed)
Called patient to assess BP and ask about edema after yesterday's visit.   Patient took BP on the phone with me, reports it read 157/87.   Took lasix yesterday and again today because she states that she is still edematous. States that this is still higher than normal for her. Has been urinating frequently after lasix dose.   Next appointment with me 01/16/2017. Asked her to take daily BP readings until then and to call with any concerns. Will follow up later this week.  Carlean Jews, Pharm.D. PGY2 Ambulatory Care Pharmacy Resident Phone: [phone]

## 2017-01-03 NOTE — Telephone Encounter (Signed)
Thank you for the update. I agree with monitoring her blood pressure. If it becomes as elevated as it was in the office the day prior we may need to consider adding an additional medication prior to her follow-up with you. Please let me know if you have any questions.

## 2017-01-06 ENCOUNTER — Telehealth: Payer: Self-pay | Admitting: Pharmacist

## 2017-01-06 MED ORDER — FUROSEMIDE 40 MG PO TABS: 40.0000 mg | ORAL_TABLET | Freq: Every day | ORAL | 0 refills | Status: DC | PRN

## 2017-01-06 NOTE — Telephone Encounter (Signed)
Patient called asking for a refill on her Furosemide. Doesn't need it again today but only has four tabs left and original Rx has 0 refills per patient. Asked the patient to call [facility] and have them fax over a refill request. Will also notify Dr. Caryl Bis.  Pt checked BP on the phone with me - improved to 147/81.   Carlean Jews, Pharm.D. PGY2 Ambulatory Care Pharmacy Resident Phone: [phone]

## 2017-01-06 NOTE — Addendum Note (Signed)
Addended by: Caryl Bis, Sharlet Notaro G on: 01/06/2017 12:50 PM   Modules accepted: Orders

## 2017-01-06 NOTE — Telephone Encounter (Signed)
Sent to pharmacy 

## 2017-01-11 ENCOUNTER — Other Ambulatory Visit: Payer: Self-pay

## 2017-01-11 MED ORDER — FUROSEMIDE 40 MG PO TABS: 40.0000 mg | ORAL_TABLET | Freq: Every day | ORAL | 0 refills | Status: DC | PRN

## 2017-01-16 ENCOUNTER — Other Ambulatory Visit: Payer: Self-pay

## 2017-01-16 ENCOUNTER — Ambulatory Visit (INDEPENDENT_AMBULATORY_CARE_PROVIDER_SITE_OTHER): Payer: Medicare Other | Admitting: Pharmacist

## 2017-01-16 VITALS — BP 134/81 | HR 98 | Wt 147.4 lb

## 2017-01-16 DIAGNOSIS — E1122 Type 2 diabetes mellitus with diabetic chronic kidney disease: Secondary | ICD-10-CM

## 2017-01-16 DIAGNOSIS — N184 Chronic kidney disease, stage 4 (severe): Secondary | ICD-10-CM | POA: Diagnosis not present

## 2017-01-16 DIAGNOSIS — Z794 Long term (current) use of insulin: Secondary | ICD-10-CM | POA: Diagnosis not present

## 2017-01-16 DIAGNOSIS — I10 Essential (primary) hypertension: Secondary | ICD-10-CM

## 2017-01-16 NOTE — Assessment & Plan Note (Signed)
Diabetes longstanding/newly diagnosed currently uncontrolled with A1C 8.8% (11/18/2016). Patient denies hypoglycemic events and is able to verbalize appropriate hypoglycemia management plan. Patient reports adherence with medication. Control is suboptimal due to dietary indiscretion and sedentary lifestyle. However, fasting BG has improved since last visit, and patient has been making better diet choices and exercising more frequently.  Following discussion and approval by Dr. [name], the following medication changes were made:  -Continue Basaglar 8 units daily  -Continue Victoza (liraglutide) 1.2 mg daily - recommend follow up weight loss trend at future visits. Not concerned for abuse or unhealthy pattern at present. Discussed with patient and daughter.  -Next A1C anticipated 02/2017.    ASCVD risk greater than 7.5%. Continue aspirin 81 mg daily and continue atorvastatin 10 mg daily  -Given age, lipid panel (LDL 58), patient is appropriately on a moderate-intensity statin.

## 2017-01-16 NOTE — Assessment & Plan Note (Signed)
Hypertension longstanding currently controlled with BP today of 134/81 mmHg.  Patient reports adherence with medication.  -Continue furosemide 40 mg PRN -Instructed pt to keep monitoring BP at home  -Consider starting carvedilol 3.125 mg BID in the future if BP remains above goal

## 2017-01-16 NOTE — Progress Notes (Signed)
S:     Chief Complaint  Patient presents with  . Medication Management    Diabetes    Patient arrives ambulating without assistance, presents with daughter, Marcie Bal. Presents for diabetes evaluation, education, and management at the request of Dr. [name]. Patient was referred on 12/30/16.  Patient was last seen by Primary Care Provider on 12/20/16. Last seen in pharmacy clinic on 01/02/2017.  Pt reports that since switching to Basalgar her headaches and constipation has gone away. States that her lower extremity edema has resolved as well since the last visit and does not have any new concerns. Has been trying to lose weight. Self stated goal weight = 140 lbs. Appetite decreased since re-starting Victoza - daughter expresses mild concern however patient wishes to remain on this dose.   Patient reports adherence with medications.  Current diabetes medications include: Basaglar 8 units daily, Victoza 1.2 daily  Current hypertension medications include: Furosemide 40 mg PRN  Patient denies hypoglycemic events.  Patient reported dietary habits: Eats 2 meals/day, skips lunch often  Breakfast: has a bowl of cereal Lunch: If she eats lunch, sandwich.  Dinner: Roasted squash and zucchini with fish or chicken Snacks: Does not snack Drinks: Drinks a lot of water, small glass of apple juice in the mornings   Patient reported exercise habits: Walks the dogs 3x a week for at least 30 minutes. Does house work.    Patient reports nocturia.  2 times a night Patient denies neuropathy. Patient reported visual changes. Has noticed some visual changes, will get eyes checked soon  Patient reports self foot exams. No issues   O:  Physical Exam  Constitutional: She appears well-developed and well-nourished.  Vitals reviewed.    Review of Systems  All other systems reviewed and are negative.    Lab Results  Component Value Date   HGBA1C 8.8 (H) 11/18/2016   Vitals:   01/16/17 1113  BP:  134/81  Pulse: 98    Home fasting CBG: 120s to 150s, with excursions to 180 and 239 2 hour post-prandial/random CBG: 209 (dinner) Bedtime CBG: 172, 351 and 238 when she missed her morning insulin dose  10 year ASCVD risk: 53.5%.  A/P: Diabetes longstanding/newly diagnosed currently uncontrolled with A1C 8.8% (11/18/2016). Patient denies hypoglycemic events and is able to verbalize appropriate hypoglycemia management plan. Patient reports adherence with medication. Control is suboptimal due to dietary indiscretion and sedentary lifestyle. However, fasting BG has improved since last visit, and patient has been making better diet choices and exercising more frequently.  Following discussion and approval by Dr. [name], the following medication changes were made:  -Continue Basaglar 8 units daily  -Continue Victoza (liraglutide) 1.2 mg daily - recommend follow up weight loss trend at future visits. Not concerned for abuse or unhealthy pattern at present. Discussed with patient and daughter.  -Next A1C anticipated 02/2017.    ASCVD risk greater than 7.5%. Continue aspirin 81 mg daily and continue atorvastatin 10 mg daily  -Given age, lipid panel (LDL 58), patient is appropriately on a moderate-intensity statin.   Hypertension longstanding currently controlled with BP today of 134/81 mmHg.  Patient reports adherence with medication.  -Continue furosemide 40 mg PRN -Instructed pt to keep monitoring BP at home  -Consider starting carvedilol 3.125 mg BID in the future if BP remains above goal  Patient was seen with Dr. [name] today in clinic.  Written patient instructions provided.  Total time in face to face counseling 40 minutes.   Follow up  in Pharmacist Clinic Visit as needed.   Patient seen with Barkley Boards, PharmD Candidate  Carlean Jews, Pharm.D. PGY2 Ambulatory Care Pharmacy Resident Phone: [phone]

## 2017-01-16 NOTE — Telephone Encounter (Signed)
Last office visit 01/02/17 Next office visit 04/17/17 Dr Caryl Bis Last refill 09/21/16

## 2017-01-16 NOTE — Progress Notes (Signed)
Care was provided under my supervision. I agree with the management as indicated in the note.  Trinty Marken DO  

## 2017-01-16 NOTE — Patient Instructions (Signed)
It was good seeing you today.  Your blood glucose readings from home look good.   Continue Basaglar 8 units daily and Victoza 1.2 mg daily.  Your blood pressure was good today in the office. No changes will be made.   Continue to monitor your blood pressure at home and keeping a log of the readings.

## 2017-01-17 MED ORDER — ALPRAZOLAM 0.5 MG PO TABS: 0.5000 mg | ORAL_TABLET | Freq: Two times a day (BID) | ORAL | 0 refills | Status: DC | PRN

## 2017-01-17 NOTE — Telephone Encounter (Signed)
Faxed to [facility] refill request received from

## 2017-01-24 DIAGNOSIS — D099 Carcinoma in situ, unspecified: Secondary | ICD-10-CM | POA: Diagnosis not present

## 2017-01-30 DIAGNOSIS — H2513 Age-related nuclear cataract, bilateral: Secondary | ICD-10-CM | POA: Diagnosis not present

## 2017-01-30 DIAGNOSIS — H35372 Puckering of macula, left eye: Secondary | ICD-10-CM | POA: Diagnosis not present

## 2017-01-30 DIAGNOSIS — E119 Type 2 diabetes mellitus without complications: Secondary | ICD-10-CM | POA: Diagnosis not present

## 2017-01-30 LAB — HM DIABETES EYE EXAM

## 2017-02-02 ENCOUNTER — Encounter: Payer: Self-pay | Admitting: Family Medicine

## 2017-02-16 DIAGNOSIS — D0472 Carcinoma in situ of skin of left lower limb, including hip: Secondary | ICD-10-CM | POA: Diagnosis not present

## 2017-02-20 ENCOUNTER — Telehealth: Payer: Self-pay | Admitting: Family Medicine

## 2017-02-20 ENCOUNTER — Other Ambulatory Visit: Payer: Self-pay | Admitting: Family Medicine

## 2017-02-20 DIAGNOSIS — E785 Hyperlipidemia, unspecified: Secondary | ICD-10-CM

## 2017-02-20 MED ORDER — LIRAGLUTIDE 18 MG/3ML ~~LOC~~ SOPN: 1.2000 mg | PEN_INJECTOR | Freq: Every day | SUBCUTANEOUS | 1 refills | Status: DC

## 2017-02-20 NOTE — Telephone Encounter (Signed)
Patient scheduled to establish with you on 04/15/17 Ok to fill labs up to date.

## 2017-02-20 NOTE — Telephone Encounter (Signed)
Last OV 12/20/16 with Dr.Cook last filed under historical

## 2017-02-20 NOTE — Telephone Encounter (Signed)
Pt needs refill on liraglutide 18 MG/3ML SOPN sent to [facility] mail order

## 2017-02-20 NOTE — Telephone Encounter (Signed)
Recent pharmacist note reviewed. Sent to pharmacy. Patient needs follow-up in pharmacy clinic. Thanks.

## 2017-02-21 NOTE — Telephone Encounter (Signed)
Left message to return call 

## 2017-02-24 ENCOUNTER — Ambulatory Visit (INDEPENDENT_AMBULATORY_CARE_PROVIDER_SITE_OTHER): Payer: Medicare Other | Admitting: *Deleted

## 2017-02-24 DIAGNOSIS — Z23 Encounter for immunization: Secondary | ICD-10-CM | POA: Diagnosis not present

## 2017-03-02 DIAGNOSIS — H2513 Age-related nuclear cataract, bilateral: Secondary | ICD-10-CM | POA: Diagnosis not present

## 2017-03-02 DIAGNOSIS — H35033 Hypertensive retinopathy, bilateral: Secondary | ICD-10-CM | POA: Diagnosis not present

## 2017-03-02 DIAGNOSIS — H35372 Puckering of macula, left eye: Secondary | ICD-10-CM | POA: Diagnosis not present

## 2017-03-02 DIAGNOSIS — H2512 Age-related nuclear cataract, left eye: Secondary | ICD-10-CM | POA: Diagnosis not present

## 2017-03-02 DIAGNOSIS — E119 Type 2 diabetes mellitus without complications: Secondary | ICD-10-CM | POA: Diagnosis not present

## 2017-03-02 DIAGNOSIS — H25012 Cortical age-related cataract, left eye: Secondary | ICD-10-CM | POA: Diagnosis not present

## 2017-03-02 DIAGNOSIS — H25013 Cortical age-related cataract, bilateral: Secondary | ICD-10-CM | POA: Diagnosis not present

## 2017-03-09 HISTORY — PX: OTHER SURGICAL HISTORY: SHX169

## 2017-03-14 DIAGNOSIS — H2512 Age-related nuclear cataract, left eye: Secondary | ICD-10-CM | POA: Diagnosis not present

## 2017-03-14 DIAGNOSIS — H25812 Combined forms of age-related cataract, left eye: Secondary | ICD-10-CM | POA: Diagnosis not present

## 2017-03-21 NOTE — Telephone Encounter (Signed)
Patient is scheduled   

## 2017-03-22 ENCOUNTER — Ambulatory Visit (INDEPENDENT_AMBULATORY_CARE_PROVIDER_SITE_OTHER): Payer: Medicare Other | Admitting: Family

## 2017-03-22 ENCOUNTER — Encounter: Payer: Self-pay | Admitting: Family

## 2017-03-22 VITALS — BP 156/70 | HR 64 | Temp 97.6°F | Ht 64.0 in | Wt 144.6 lb

## 2017-03-22 DIAGNOSIS — Z9989 Dependence on other enabling machines and devices: Secondary | ICD-10-CM

## 2017-03-22 DIAGNOSIS — E1122 Type 2 diabetes mellitus with diabetic chronic kidney disease: Secondary | ICD-10-CM | POA: Diagnosis not present

## 2017-03-22 DIAGNOSIS — G4733 Obstructive sleep apnea (adult) (pediatric): Secondary | ICD-10-CM | POA: Diagnosis not present

## 2017-03-22 DIAGNOSIS — N184 Chronic kidney disease, stage 4 (severe): Secondary | ICD-10-CM | POA: Diagnosis not present

## 2017-03-22 DIAGNOSIS — I10 Essential (primary) hypertension: Secondary | ICD-10-CM | POA: Diagnosis not present

## 2017-03-22 DIAGNOSIS — H2512 Age-related nuclear cataract, left eye: Secondary | ICD-10-CM | POA: Diagnosis not present

## 2017-03-22 DIAGNOSIS — R5383 Other fatigue: Secondary | ICD-10-CM | POA: Insufficient documentation

## 2017-03-22 DIAGNOSIS — Z794 Long term (current) use of insulin: Secondary | ICD-10-CM

## 2017-03-22 LAB — CBC WITH DIFFERENTIAL/PLATELET
Basophils Absolute: 0.1 10*3/uL (ref 0.0–0.1)
Basophils Relative: 1.2 % (ref 0.0–3.0)
Eosinophils Absolute: 0.1 10*3/uL (ref 0.0–0.7)
Eosinophils Relative: 1.4 % (ref 0.0–5.0)
HCT: 41 % (ref 36.0–46.0)
Hemoglobin: 13.6 g/dL (ref 12.0–15.0)
Lymphocytes Relative: 28.8 % (ref 12.0–46.0)
Lymphs Abs: 1.9 10*3/uL (ref 0.7–4.0)
MCHC: 33.2 g/dL (ref 30.0–36.0)
MCV: 93.4 fl (ref 78.0–100.0)
Monocytes Absolute: 0.6 10*3/uL (ref 0.1–1.0)
Monocytes Relative: 8.9 % (ref 3.0–12.0)
Neutro Abs: 4 10*3/uL (ref 1.4–7.7)
Neutrophils Relative %: 59.7 % (ref 43.0–77.0)
Platelets: 180 10*3/uL (ref 150.0–400.0)
RBC: 4.39 Mil/uL (ref 3.87–5.11)
RDW: 14.5 % (ref 11.5–15.5)
WBC: 6.6 10*3/uL (ref 4.0–10.5)

## 2017-03-22 LAB — COMPREHENSIVE METABOLIC PANEL
ALT: 13 U/L (ref 0–35)
AST: 19 U/L (ref 0–37)
Albumin: 4.3 g/dL (ref 3.5–5.2)
Alkaline Phosphatase: 108 U/L (ref 39–117)
BUN: 27 mg/dL — ABNORMAL HIGH (ref 6–23)
CO2: 35 mEq/L — ABNORMAL HIGH (ref 19–32)
Calcium: 9.6 mg/dL (ref 8.4–10.5)
Chloride: 99 mEq/L (ref 96–112)
Creatinine, Ser: 2.24 mg/dL — ABNORMAL HIGH (ref 0.40–1.20)
GFR: 22.6 mL/min — ABNORMAL LOW (ref >60.00)
Glucose, Bld: 208 mg/dL — ABNORMAL HIGH (ref 70–99)
Potassium: 3.7 mEq/L (ref 3.5–5.1)
Sodium: 142 mEq/L (ref 135–145)
Total Bilirubin: 0.8 mg/dL (ref 0.2–1.2)
Total Protein: 7.3 g/dL (ref 6.0–8.3)

## 2017-03-22 LAB — TSH: TSH: 3.93 u[IU]/mL (ref 0.35–4.50)

## 2017-03-22 LAB — VITAMIN D 25 HYDROXY (VIT D DEFICIENCY, FRACTURES): VITD: 29.04 ng/mL — ABNORMAL LOW (ref 30.00–100.00)

## 2017-03-22 LAB — HEMOGLOBIN A1C: Hgb A1c MFr Bld: 8.3 % — ABNORMAL HIGH (ref 4.6–6.5)

## 2017-03-22 LAB — IBC PANEL
Iron: 92 ug/dL (ref 42–145)
Saturation Ratios: 25.5 % (ref 20.0–50.0)
Transferrin: 258 mg/dL (ref 212.0–360.0)

## 2017-03-22 NOTE — Assessment & Plan Note (Signed)
Pending a1c. Presume controlled. 7 pound weight loss. Advised patient to closely monitor and if persists ( v plateaus) she will make f/u with Chrys Racer or PCP.

## 2017-03-22 NOTE — Assessment & Plan Note (Signed)
Elevated today. No anti hypertensives. She only takes prn lasix for LE swelling. Had been controlled at last visit. CKD. Advised patient to follow low salt and to take lasix at home, she will call if blood pressure does not respond.

## 2017-03-22 NOTE — Assessment & Plan Note (Signed)
Compliant with cipap.  

## 2017-03-22 NOTE — Assessment & Plan Note (Signed)
Suspect multifactorial. Discussed depression, poor sleep hygiene, renal disease. Advised to stop ativan and especially old ambien bottle. Advised her to follow up with PCP regarding insomnia/depression for safer agent such as trazodone if lifestyle modications not helpful. Pending labs today and will follow with

## 2017-03-22 NOTE — Patient Instructions (Addendum)
Monitor blood pressure,  Goal is less than 130/80; if persistently higher, please make sooner follow up appointment so we can recheck you blood pressure and manage medications   If remains high after lasix today, please call and let us know.   Labs today  Essentials for good sleep:   #1 Exercise #2 Limit Caffeine ( no caffeine after lunch) #3 No smart phones, TV prior to bed -- BLUE light is VERY activating and send the brain an 'awake message.'  #4 Go to bed at same time of night each night and get up at same time of day.  #5 Take 0.5 to 5mg  melatonin at 7pm with dinner -this is when natural melatonin will start to increase #6 May try over the counter Unisom for sleep aid  Ensure you have follow with Dr Caryl Bis

## 2017-03-22 NOTE — Progress Notes (Signed)
Subjective:    Patient ID: Kelly [name], female    DOB: [birth year], 76 y.o.   MRN: [id number]  CC: Kelly [name] is a 76 y.o. female who presents today for an acute visit.    HPI: CC: fatigue for several weeks, unchanged. Patient hasn't noticed however daughter was concerned and made an appointment.   NO exercise.   Wearing cipap. Not waking up feeling restored. Takes occasional Kelly [name] - its an old bottle.   Accompanied with daughter who has noticed her sleeping until 4pm. Going to bed at 1am.   Daughter worried Kelly [name] is causing weight loss however patient is not displeased with weight loss. FBG have been under 150. No hypoglyemic episodes. Daughters worried it has suppressed appetite and 'not getting enough fuel'. Would like thyroid and kidney function checked today  HTN- notes had been late to appointment which made her anxious. No longer on blood pressure medications per patient from nephrologist. Takes lasix prn for le swelling and 'seems to control' blood pressure.  Denies exertional chest pain or pressure, numbness or tingling radiating to left arm or jaw, palpitations, dizziness, frequent headaches, changes in vision, or shortness of breath.   Depression- on paxil. Declines increasing dose. Using xanax very rarely however notes feeling groggy on medication.   DM- basaglar 8 units in the morning; on Kelly [name]          10/2016 [place]- stress test 09/2016 showed no ischemia  Renal failure/ acute interstitial nephritis- Kelly [name] No longer on Prednisone.  Anemia- ? Candidate fo  HISTORY:  Past Medical History:  Diagnosis Date  . Colon polyps   . Depression   . DM (diabetes mellitus), type 2 with renal complications ([place])   . Hyperlipidemia   . Hypertension   . Kidney stones   . 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    Past Surgical History:  Procedure Laterality Date  . APPENDECTOMY    . cataract  03/2017  .  CHOLECYSTECTOMY    . COLONOSCOPY    . ESOPHAGOGASTRODUODENOSCOPY    . RECTAL PROLAPSE REPAIR     x2   Family History  Problem Relation Age of Onset  . Stroke Mother   . Arthritis Mother   . Stroke Sister   . Stroke Sister   . Lung cancer Sister   . Heart attack Father 78    Allergies: Prilosec otc [omeprazole magnesium]; Sucralfate; Amoxicillin; and Morphine and related Current Outpatient Medications on File Prior to Visit  Medication Sig Dispense Refill  . ALPRAZolam (XANAX) 0.5 MG tablet Take 1 tablet (0.5 mg total) by mouth 2 (two) times daily as needed for anxiety. 30 tablet 0  . aspirin EC 81 MG tablet Take 81 mg by mouth daily.    Marland Kitchen atorvastatin (LIPITOR) 10 MG tablet TAKE 1 TABLET DAILY 90 tablet 1  . calcium citrate-vitamin D (CITRACAL+D) 315-200 MG-UNIT tablet Take 1 tablet by mouth 2 (two) times daily.    . cyclobenzaprine (FLEXERIL) 10 MG tablet Take 1 tablet (10 mg total) by mouth 3 (three) times daily as needed for muscle spasms. 90 tablet 0  . furosemide (LASIX) 40 MG tablet Take 1 tablet (40 mg total) by mouth daily as needed. 90 tablet 0  . gabapentin (NEURONTIN) 300 MG capsule Take 1 capsule (300 mg total) by mouth at bedtime. 90 capsule 1  . glucose blood (IGLUCOSE TEST STRIPS) test strip Use as instructed 100 each 12  . Insulin Glargine (BASAGLAR  KWIKPEN) 100 UNIT/ML SOPN Inject 0.08 mLs (8 Units total) into the skin at bedtime. 1 pen 3  . Insulin Pen Needle (PEN NEEDLES) 32G X 4 MM MISC 100 each by Does not apply route daily. 100 each 3  . Lancets MISC Use up to 4 times daily to check blood sugars. 200 each 11  . liraglutide 18 MG/3ML SOPN Inject 0.2 mLs (1.2 mg total) into the skin daily. 6 pen 1  . PARoxetine (PAXIL-CR) 25 MG 24 hr tablet Take 25 mg by mouth daily.     No current facility-administered medications on file prior to visit.     Social History   Tobacco Use  . Smoking status: Never Smoker  . Smokeless tobacco: Never Used  Substance Use Topics   . Alcohol use: No  . Drug use: No    Review of Systems  Constitutional: Positive for fatigue. Negative for chills and fever.  Respiratory: Negative for cough, shortness of breath and wheezing.   Cardiovascular: Negative for chest pain and palpitations.  Gastrointestinal: Negative for nausea and vomiting.  Neurological: Negative for headaches.  Psychiatric/Behavioral: Negative for sleep disturbance and suicidal ideas. The patient is not nervous/anxious.       Objective:    BP (!) 156/70   Pulse 64   Temp 97.6 F (36.4 C) (Oral)   Ht 5\' 4"  (1.626 m)   Wt 144 lb 9.6 oz (65.6 kg)   PF 96 L/min   BMI 24.82 kg/m  Wt Readings from Last 3 Encounters:  03/22/17 144 lb 9.6 oz (65.6 kg)  01/16/17 147 lb 6.4 oz (66.9 kg)  01/02/17 151 lb 6.4 oz (68.7 kg)   BP Readings from Last 3 Encounters:  03/22/17 (!) 156/70  01/16/17 134/81  01/02/17 (!) 170/105     Physical Exam  Constitutional: She appears well-developed and well-nourished.  Eyes: Conjunctivae are normal.  Neck: No thyroid mass and no thyromegaly present.  Cardiovascular: Normal rate, regular rhythm, normal heart sounds and normal pulses.  Pulmonary/Chest: Effort normal and breath sounds normal. She has no wheezes. She has no rhonchi. She has no rales.  Lymphadenopathy:       Head (right side): No submental, no submandibular, no tonsillar, no preauricular, no posterior auricular and no occipital adenopathy present.       Head (left side): No submental, no submandibular, no tonsillar, no preauricular, no posterior auricular and no occipital adenopathy present.    She has no cervical adenopathy.  Neurological: She is alert.  Skin: Skin is warm and dry.  Psychiatric: She has a normal mood and affect. Her speech is normal and behavior is normal. Thought content normal.  Vitals reviewed.      Assessment & Plan:   Problem List Items Addressed This Visit      Cardiovascular and Mediastinum   Hypertension    Elevated  today. No anti hypertensives. She only takes prn lasix for LE swelling. Had been controlled at last visit. CKD. Advised patient to follow low salt and to take lasix at home, she will call if blood pressure does not respond.         Respiratory   OSA on CPAP    Compliant with cipap        Endocrine   DM (diabetes mellitus), type 2 with renal complications ([facility])    Pending a1c. Presume controlled. 7 pound weight loss. Advised patient to closely monitor and if persists ( v plateaus) she will make f/u with Kelly [name] or Kelly [name].  Other   Other fatigue - Primary    Suspect multifactorial. Discussed depression, poor sleep hygiene, renal disease. Advised to stop ativan and especially old ambien bottle. Advised her to follow up with Kelly [name] regarding insomnia/depression for safer agent such as trazodone if lifestyle modications not helpful. Pending labs today and will follow with       Relevant Orders   CBC with Differential/Platelet   Comprehensive metabolic panel   Hemoglobin A1c   TSH   VITAMIN D 25 Hydroxy (Vit-D Deficiency, Fractures)   IBC panel         I am having Anysia G. Besecker maintain her aspirin EC, calcium citrate-vitamin D, PARoxetine, cyclobenzaprine, gabapentin, glucose blood, Lancets, BASAGLAR KWIKPEN, Pen Needles, furosemide, ALPRAZolam, atorvastatin, and liraglutide.   No orders of the defined types were placed in this encounter.   Return precautions given.   Risks, benefits, and alternatives of the medications and treatment plan prescribed today were discussed, and patient expressed understanding.   Education regarding symptom management and diagnosis given to patient on AVS.  Continue to follow with Kelly Haven, MD for routine health maintenance.   Leeasia Lovenia Shuck and I agreed with plan.   Kelly Paris, Kelly [name]

## 2017-03-24 ENCOUNTER — Telehealth: Payer: Self-pay | Admitting: Family Medicine

## 2017-03-24 NOTE — Telephone Encounter (Signed)
Labs not yet resulted by MD

## 2017-03-24 NOTE — Telephone Encounter (Signed)
Copied from [place] [id number]. Topic: Quick Communication - Lab Results >> Mar 24, 2017 11:48 AM [name], NT wrote: Pt. Is calling in about wanting her results to her lab test. Pt would like a call back.

## 2017-03-25 DIAGNOSIS — T2101XA Burn of unspecified degree of chest wall, initial encounter: Secondary | ICD-10-CM | POA: Diagnosis not present

## 2017-03-25 DIAGNOSIS — M5032 Other cervical disc degeneration, mid-cervical region, unspecified level: Secondary | ICD-10-CM | POA: Diagnosis not present

## 2017-03-25 DIAGNOSIS — M542 Cervicalgia: Secondary | ICD-10-CM | POA: Diagnosis not present

## 2017-03-25 DIAGNOSIS — R52 Pain, unspecified: Secondary | ICD-10-CM | POA: Diagnosis not present

## 2017-03-25 DIAGNOSIS — M25572 Pain in left ankle and joints of left foot: Secondary | ICD-10-CM | POA: Diagnosis not present

## 2017-03-25 DIAGNOSIS — M545 Low back pain: Secondary | ICD-10-CM | POA: Diagnosis not present

## 2017-03-25 DIAGNOSIS — M6281 Muscle weakness (generalized): Secondary | ICD-10-CM | POA: Diagnosis not present

## 2017-03-25 DIAGNOSIS — E1165 Type 2 diabetes mellitus with hyperglycemia: Secondary | ICD-10-CM | POA: Diagnosis not present

## 2017-03-25 DIAGNOSIS — M7062 Trochanteric bursitis, left hip: Secondary | ICD-10-CM | POA: Diagnosis not present

## 2017-03-25 DIAGNOSIS — M47812 Spondylosis without myelopathy or radiculopathy, cervical region: Secondary | ICD-10-CM | POA: Diagnosis not present

## 2017-03-25 DIAGNOSIS — M5136 Other intervertebral disc degeneration, lumbar region: Secondary | ICD-10-CM | POA: Diagnosis not present

## 2017-03-25 DIAGNOSIS — M25519 Pain in unspecified shoulder: Secondary | ICD-10-CM | POA: Diagnosis not present

## 2017-04-03 ENCOUNTER — Encounter: Payer: Self-pay | Admitting: Pharmacist

## 2017-04-03 ENCOUNTER — Ambulatory Visit: Payer: Medicare Other | Admitting: Pharmacist

## 2017-04-03 ENCOUNTER — Ambulatory Visit (INDEPENDENT_AMBULATORY_CARE_PROVIDER_SITE_OTHER): Payer: Medicare Other | Admitting: Pharmacist

## 2017-04-03 DIAGNOSIS — H25011 Cortical age-related cataract, right eye: Secondary | ICD-10-CM | POA: Diagnosis not present

## 2017-04-03 DIAGNOSIS — N184 Chronic kidney disease, stage 4 (severe): Secondary | ICD-10-CM

## 2017-04-03 DIAGNOSIS — E1122 Type 2 diabetes mellitus with diabetic chronic kidney disease: Secondary | ICD-10-CM

## 2017-04-03 DIAGNOSIS — H2511 Age-related nuclear cataract, right eye: Secondary | ICD-10-CM | POA: Diagnosis not present

## 2017-04-03 DIAGNOSIS — Z794 Long term (current) use of insulin: Secondary | ICD-10-CM

## 2017-04-03 MED ORDER — BASAGLAR KWIKPEN 100 UNIT/ML ~~LOC~~ SOPN: 10.0000 [IU] | PEN_INJECTOR | Freq: Every day | SUBCUTANEOUS | 3 refills | Status: DC

## 2017-04-03 NOTE — Assessment & Plan Note (Signed)
#  Diabetes longstanding, improved per A1C and CBG readings however currently uncontrolled. Patient denies frequent hypoglycemic events and is able to verbalize appropriate hypoglycemia management plan. Patient reports adherence with medication. Control is suboptimal due to dietary indiscretion, sedentary lifestyle. Therapeutic options are limited given CKD (GFR = 22).  Following discussion and approval by Dr. Derrel Nip, the following medication changes were made:  - Increase Basaglar to 10 units daily - Continue Victoza 1.2 mg daily - continued this dose for now given previous concerns over weight loss.  - Next A1C anticipated 06/22/2016.    #ASCVD risk - greater than 7.5%.-Given age, lipid panel (LDL 58), patient is appropriately on a moderate-intensity statin.  - Continued Aspirin 81 mg  - Continued atorvastatin 10 mg.

## 2017-04-03 NOTE — Patient Instructions (Signed)
Thanks for coming to see me today. Your blood sugars are still a little high but much better. We are shooting for an A1C of less than 7.   1. Increase your Basaglar to 10 units once daily  2. Work on decreasing the juice in [employer]. I like those ICE waters from the grocery store.   3. Work on increasing your exercise by walking for 30 minutes 5 times a week.   YOU CAN DO IT!  I'll call you in ~2 weeks and see how you're doing. My number is [phone]. Call me if there are any issues with low blood sugars.

## 2017-04-03 NOTE — Progress Notes (Deleted)
    S:     No chief complaint on file.   Patient arrives ambulating without assistance, presents with daughter, Marcie Bal. Presents for diabetes evaluation, education, and management at the request of Dr. [name] originally, then Mable Paris, NP (03/22/2017 lab note).  Patient was last seen by Primary Care Provider on 03/22/17 with complaints of fatigue, A1C was found to be improved from 8.8>8.3 but still uncontrolled. Last seen in pharmacy clinic on 01/16/2017 - at that time, no changes to medications were made.   ***weight loss, HA's and constipation since switch to [employer]. LEE, HTN  Has been trying to lose weight. Self stated goal weight = 140 lbs. Appetite decreased since re-starting Victoza - daughter expresses mild concern however patient wishes to remain on this dose.   Insurance coverage/medication affordability: ***  Patient {Actions; denies-reports:120008} adherence with medications.  Current diabetes medications include: Current hypertension medications include:   Patient {Actions; denies-reports:120008} hypoglycemic events.  Patient reported dietary habits: Eats *** meals/day Breakfast:*** Lunch:*** Dinner:*** Snacks:*** Drinks:***  Patient reported exercise habits:    Patient {Actions; denies-reports:120008} nocturia.  Patient {Actions; denies-reports:120008} pain/burning on urination.  Patient {Actions; denies-reports:120008} neuropathy. Patient {Actions; denies-reports:120008} visual changes. Patient {Actions; denies-reports:120008} self foot exams.   Nena Jordan   O:  Physical Exam   ROS   Lab Results  Component Value Date   HGBA1C 8.3 (H) 03/22/2017   There were no vitals filed for this visit. {Lipid panel:10928}  Home fasting CBG: ***  2 hour post-prandial/random CBG: ***.  10 year ASCVD risk: ***.  A/P: #Diabetes longstanding/newly diagnosed currently ***. Patient {Actions; denies-reports:120008} hypoglycemic events and is able to verbalize  appropriate hypoglycemia management plan. Patient {Actions; denies-reports:120008} adherence with medication. Control is suboptimal due to ***. Following discussion and approval by ***, the following medication changes were made:  - *** - Counseled on sick day rules for *** - Next A1C anticipated ***.    #ASCVD risk - patient with clinical ASCVD/greater than 7.5%. - {Meds adjust:18428} Aspirin *** mg  - {Meds adjust:18428} ***statin *** mg.   #Hypertension longstanding/newly diagnosed currently ***.  Patient {Actions; denies-reports:120008} adherence with medication. Control is suboptimal due to ***. - ***  Patient was seen with *** today in clinic and medication changes were discussed and approved prior to initiation.Written patient instructions provided.  Total time in face to face counseling *** minutes.    Follow up in Pharmacist Clinic Visit ***.   Patient seen with ***  Carlean Jews, Pharm.D. PGY2 Ambulatory Care Pharmacy Resident Phone: [phone]

## 2017-04-03 NOTE — Assessment & Plan Note (Signed)
#  Hypertension longstanding currently reasonably controlled.  Patient reports adherence with medication. Control is suboptimal 2/2 dietary indiscretion and sedentary lifestyle - Continue current meds, can consider initiation of coreg at future visits

## 2017-04-03 NOTE — Progress Notes (Addendum)
    S:     Chief Complaint  Patient presents with  . Medication Management    Diabetes    Patient arrives in good spirits, ambulating without assistance, presenting with her daughter, Marcie Bal.  Presents for diabetes evaluation, education, and management at the request of Dr. Marisa Severin. Patient was referred on 12/30/2016.  Patient was last seen by Primary Care Provider on 03/22/2017 for complaints of fatigue. Last Rx Clinic visit on 01/16/2017 - at that time meds were continued.   Today, patient reports that her blood sugars have been better overall. Her brother recently moved out and therefore she has not had as many sweets in the house. Does like to eat dessert/juice and sometimes has midnight snacks. Reports that previously, she has been on metformin but this caused intolerable GI upset which continued well past 2 weeks. Does have some decreased PO intake but not losing weight anymore. Daughter appears pleased by this as she has previously been concerned over weight loss.   Patient reports adherence with medications.  Current diabetes medications include: Basaglar 8 units daily, victoza 1.2 mg daily.  Current hypertension medications include: furosemide 40 mg PRN.   Patient reports hypoglycemic events x1 to 64 - correcting with small amount of regular soda.   Patient reported exercise habits: none at present   O:  Physical Exam  Constitutional: She appears well-developed and well-nourished.     Review of Systems  All other systems reviewed and are negative.    Lab Results  Component Value Date   HGBA1C 8.3 (H) 03/22/2017   Vitals:   04/03/17 1341  BP: 136/82  Pulse: 85   Home fasting CBG: 140s-160s, excursions to low 200s  A/P: #Diabetes longstanding, improved per A1C and CBG readings however currently uncontrolled. Patient denies frequent hypoglycemic events and is able to verbalize appropriate hypoglycemia management plan. Patient reports adherence with medication.  Control is suboptimal due to dietary indiscretion, sedentary lifestyle. Therapeutic options are limited given CKD (GFR = 22).  Following discussion and approval by Dr. Derrel Nip, the following medication changes were made:  - Increase Basaglar to 10 units daily - Continue Victoza 1.2 mg daily - continued this dose for now given previous concerns over weight loss.  - Next A1C anticipated 06/22/2016.    #ASCVD risk - greater than 7.5%.-Given age, lipid panel (LDL 58), patient is appropriately on a moderate-intensity statin.  - Continued Aspirin 81 mg  - Continued atorvastatin 10 mg.   #Hypertension longstanding currently reasonably controlled.  Patient reports adherence with medication. Control is suboptimal 2/2 dietary indiscretion and sedentary lifestyle - Continue current meds, can consider initiation of coreg at future visits  Patient was seen with Dr. Derrel Nip today in clinic and medication changes were discussed and approved prior to initiation.Written patient instructions provided.  Total time in face to face counseling 20 minutes.    Follow up in Pharmacist Clinic over the phone in 2 weeks, appointment PRN.  Carlean Jews, Pharm.D. PGY2 Ambulatory Care Pharmacy Resident Phone: [phone]   I have reviewed the above information and agree with above.   Deborra Medina, MD

## 2017-04-07 DIAGNOSIS — N179 Acute kidney failure, unspecified: Secondary | ICD-10-CM | POA: Diagnosis not present

## 2017-04-07 DIAGNOSIS — E1129 Type 2 diabetes mellitus with other diabetic kidney complication: Secondary | ICD-10-CM | POA: Diagnosis not present

## 2017-04-07 DIAGNOSIS — N10 Acute tubulo-interstitial nephritis: Secondary | ICD-10-CM | POA: Diagnosis not present

## 2017-04-07 DIAGNOSIS — N184 Chronic kidney disease, stage 4 (severe): Secondary | ICD-10-CM | POA: Diagnosis not present

## 2017-04-09 NOTE — Progress Notes (Signed)
  I have reviewed the above information and agree with above.   Kerianne Gurr, MD 

## 2017-04-11 DIAGNOSIS — H25811 Combined forms of age-related cataract, right eye: Secondary | ICD-10-CM | POA: Diagnosis not present

## 2017-04-11 DIAGNOSIS — H2511 Age-related nuclear cataract, right eye: Secondary | ICD-10-CM | POA: Diagnosis not present

## 2017-04-17 ENCOUNTER — Telehealth: Payer: Self-pay | Admitting: Pharmacist

## 2017-04-17 ENCOUNTER — Ambulatory Visit: Payer: Medicare Other | Admitting: Family Medicine

## 2017-04-17 NOTE — Telephone Encounter (Signed)
Called patient to follow up on CBGs since last appointment. Patient was still asleep but did speak with her daughter, Marcie Bal who is very involved in her care. Marcie Bal reports that they have completely cut out juice and are down to an occasional pepsi a couple of times a week. Does still endorse dietary indiscretion in evenings (pudding, sherbert, ice cream) which always cause post-prandial CBGs to run higher.   Reports the following CBGs:  Fasting - 115, 110, 119, 103, 123 Random (unclear how long after eating these were taken) 197, 220, 269 No hypoglycemia, no n/v  Advised patient to continue current dose of insulin and victoza (daughter not sure what dose patient has been taking). Patient is OK to follow up with Dr. Caryl Bis for DM care or return to see me if CBGs become uncontrolled. Daughter expressed understanding and thanks.  Carlean Jews, Pharm.D., BCPS PGY2 Ambulatory Care Pharmacy Resident Phone: [phone]

## 2017-04-18 DIAGNOSIS — H2511 Age-related nuclear cataract, right eye: Secondary | ICD-10-CM | POA: Diagnosis not present

## 2017-04-26 ENCOUNTER — Ambulatory Visit (INDEPENDENT_AMBULATORY_CARE_PROVIDER_SITE_OTHER): Payer: Medicare Other | Admitting: Family Medicine

## 2017-04-26 ENCOUNTER — Other Ambulatory Visit: Payer: Self-pay

## 2017-04-26 ENCOUNTER — Encounter: Payer: Self-pay | Admitting: Family Medicine

## 2017-04-26 VITALS — BP 130/70 | HR 88 | Temp 97.5°F | Wt 147.2 lb

## 2017-04-26 DIAGNOSIS — G250 Essential tremor: Secondary | ICD-10-CM | POA: Diagnosis not present

## 2017-04-26 DIAGNOSIS — G4733 Obstructive sleep apnea (adult) (pediatric): Secondary | ICD-10-CM

## 2017-04-26 DIAGNOSIS — E1122 Type 2 diabetes mellitus with diabetic chronic kidney disease: Secondary | ICD-10-CM

## 2017-04-26 DIAGNOSIS — E785 Hyperlipidemia, unspecified: Secondary | ICD-10-CM | POA: Diagnosis not present

## 2017-04-26 DIAGNOSIS — I10 Essential (primary) hypertension: Secondary | ICD-10-CM

## 2017-04-26 DIAGNOSIS — N184 Chronic kidney disease, stage 4 (severe): Secondary | ICD-10-CM | POA: Diagnosis not present

## 2017-04-26 DIAGNOSIS — Z9989 Dependence on other enabling machines and devices: Secondary | ICD-10-CM | POA: Diagnosis not present

## 2017-04-26 DIAGNOSIS — Z794 Long term (current) use of insulin: Secondary | ICD-10-CM

## 2017-04-26 NOTE — Patient Instructions (Signed)
Nice to see you. We will get you to see a nutritionist. Please check with your CPAP company regarding a mask that will fit better. Please follow-up with your nephrologist. Please monitor your tremors.

## 2017-04-26 NOTE — Assessment & Plan Note (Signed)
History consistent with essential tremor.  She does have this as well.  No evidence of Parkinson's on exam.  She will monitor.  I offered referral for physical therapy or neurology evaluation given slight balance issues though I do suspect those are related to her neuropathy.  She will monitor per her choice.

## 2017-04-26 NOTE — Assessment & Plan Note (Signed)
Relatively stable.  She will continue to follow with nephrology.  I discussed avoiding NSAIDs.  Discussed that she should not overuse her Lasix.

## 2017-04-26 NOTE — Assessment & Plan Note (Signed)
-  Continue Lipitor °

## 2017-04-26 NOTE — Progress Notes (Signed)
Kelly Rumps, MD Phone: [phone]  Kelly [name] is a 76 y.o. female who presents today for follow-up.  Diabetes: Fasting was 103-214.  Though typically greater than 140.  Bedtime up into the upper 200s and 300s.  Taking Victoza in the morning.  Insulin glargine eating.  No polyuria or polydipsia.  Rare hypoglycemia.  She has nighttime indiscretions with her food.  Diet does not sound to healthy.  Hypertension: Notes it runs pretty good when she does check it.  Occasional Lasix use.  No chest pain or shortness of breath.  Has chronic intermittent edema with no orthopnea or PND.  Does respond to Lasix.  Worsens with salty food intake.  Hyperlipidemia: Taking Lipitor.  No myalgias or right upper quadrant pain.  Does note she has some cramps in her hands and feet at times which have improved with decreasing the dose of Lipitor and using a muscle relaxer.  OSA: Using CPAP nightly for at least 4 hours.  Notes it comes off in the middle the night.  Notes a smaller mask is more beneficial.  She does note slight tremor when she reads the paper.  No other significant tremor.  Notes balance feels a little bit off for some time now.  Does have a history of neuropathy.  Takes gabapentin for this.  Social History   Tobacco Use  Smoking Status Never Smoker  Smokeless Tobacco Never Used     ROS see history of present illness  Objective  Physical Exam Vitals:   04/26/17 1134  BP: 130/70  Pulse: 88  Temp: (!) 97.5 F (36.4 C)  SpO2: 98%    BP Readings from Last 3 Encounters:  04/26/17 130/70  04/03/17 136/82  03/22/17 (!) 156/70   Wt Readings from Last 3 Encounters:  04/26/17 147 lb 3.2 oz (66.8 kg)  04/03/17 147 lb (66.7 kg)  03/22/17 144 lb 9.6 oz (65.6 kg)    Physical Exam  Constitutional: No distress.  Cardiovascular: Normal rate, regular rhythm and normal heart sounds.  Pulmonary/Chest: Effort normal and breath sounds normal.  Musculoskeletal: She exhibits no  edema.  Neurological: She is alert. Gait normal.  CN 2-12 intact, 5/5 strength in bilateral biceps, triceps, grip, quads, hamstrings, plantar and dorsiflexion, sensation to light touch intact in bilateral UE and LE, minimal tremor with holding the paper, otherwise no tremor, no cogwheel rigidity  Skin: Skin is warm and dry. She is not diaphoretic.     Assessment/Plan: Please see individual problem list.  Hypertension Well-controlled.  Continue current regimen.  OSA on CPAP Encouraged patient to check with her CPAP company to get a better fitting mask.  She will continue CPAP.  DM (diabetes mellitus), type 2 with renal complications (HCC) Uncontrolled.  We will increase insulin glargine to 12 units in the morning.  She will monitor for any hypoglycemic episodes.  Will refer to a nutritionist as well.  She will follow-up with Kelly [name] in about a month.  Follow-up with me in 3 months.  CKD (chronic kidney disease) stage 4, GFR 15-29 ml/min (HCC) Relatively stable.  She will continue to follow with nephrology.  I discussed avoiding NSAIDs.  Discussed that she should not overuse her Lasix.  Hyperlipidemia Continue Lipitor.  Essential tremor History consistent with essential tremor.  She does have this as well.  No evidence of Parkinson's on exam.  She will monitor.  I offered referral for physical therapy or neurology evaluation given slight balance issues though I do suspect those are related to  her neuropathy.  She will monitor per her choice.   Kelly [name] was seen today for establish care.  Diagnoses and all orders for this visit:  Type 2 diabetes mellitus with stage 4 chronic kidney disease, with long-term current use of insulin (HCC) -     Amb ref to Medical Nutrition Therapy-MNT  Essential hypertension  OSA on CPAP  CKD (chronic kidney disease) stage 4, GFR 15-29 ml/min (HCC)  Hyperlipidemia, unspecified hyperlipidemia type  Essential tremor    Orders Placed This  Encounter  Procedures  . Amb ref to Medical Nutrition Therapy-MNT    Referral Priority:   Routine    Referral Type:   Consultation    Referral Reason:   Specialty Services Required    Requested Specialty:   Nutrition    Number of Visits Requested:   1    No orders of the defined types were placed in this encounter.    Kelly Rumps, MD Kelly [name]

## 2017-04-26 NOTE — Assessment & Plan Note (Signed)
Encouraged patient to check with her CPAP company to get a better fitting mask.  She will continue CPAP.

## 2017-04-26 NOTE — Assessment & Plan Note (Signed)
Uncontrolled.  We will increase insulin glargine to 12 units in the morning.  She will monitor for any hypoglycemic episodes.  Will refer to a nutritionist as well.  She will follow-up with Chrys Racer in about a month.  Follow-up with me in 3 months.

## 2017-04-26 NOTE — Assessment & Plan Note (Signed)
Well-controlled.  Continue current regimen. 

## 2017-05-12 DIAGNOSIS — N10 Acute tubulo-interstitial nephritis: Secondary | ICD-10-CM | POA: Diagnosis not present

## 2017-05-12 DIAGNOSIS — I10 Essential (primary) hypertension: Secondary | ICD-10-CM | POA: Diagnosis not present

## 2017-05-12 DIAGNOSIS — N184 Chronic kidney disease, stage 4 (severe): Secondary | ICD-10-CM | POA: Diagnosis not present

## 2017-05-15 DIAGNOSIS — Z85828 Personal history of other malignant neoplasm of skin: Secondary | ICD-10-CM | POA: Diagnosis not present

## 2017-05-15 DIAGNOSIS — D225 Melanocytic nevi of trunk: Secondary | ICD-10-CM | POA: Diagnosis not present

## 2017-05-15 DIAGNOSIS — L821 Other seborrheic keratosis: Secondary | ICD-10-CM | POA: Diagnosis not present

## 2017-05-15 DIAGNOSIS — D2261 Melanocytic nevi of right upper limb, including shoulder: Secondary | ICD-10-CM | POA: Diagnosis not present

## 2017-05-15 DIAGNOSIS — X32XXXA Exposure to sunlight, initial encounter: Secondary | ICD-10-CM | POA: Diagnosis not present

## 2017-05-15 DIAGNOSIS — L570 Actinic keratosis: Secondary | ICD-10-CM | POA: Diagnosis not present

## 2017-05-22 ENCOUNTER — Encounter: Payer: Self-pay | Admitting: Dietician

## 2017-05-22 ENCOUNTER — Encounter: Payer: Medicare Other | Attending: Family Medicine | Admitting: Dietician

## 2017-05-22 VITALS — Ht 64.0 in | Wt 141.9 lb

## 2017-05-22 DIAGNOSIS — Z794 Long term (current) use of insulin: Secondary | ICD-10-CM | POA: Diagnosis not present

## 2017-05-22 DIAGNOSIS — E1122 Type 2 diabetes mellitus with diabetic chronic kidney disease: Secondary | ICD-10-CM | POA: Insufficient documentation

## 2017-05-22 DIAGNOSIS — Z713 Dietary counseling and surveillance: Secondary | ICD-10-CM | POA: Insufficient documentation

## 2017-05-22 DIAGNOSIS — N184 Chronic kidney disease, stage 4 (severe): Secondary | ICD-10-CM | POA: Diagnosis not present

## 2017-05-22 NOTE — Patient Instructions (Addendum)
  Include 3 meals spaced 4-5 hours apart. Balance meals with protein, 2-3 servings of carbohydrate ( mainly starch and fruit choices) and non-starchy vegetables. Avoid fruit juice or try a low calorie juice with no more than 5 gms carbohydrate/cup. Use Ms. DASH in place of salt. Sodium recommendations: less than 2000 mg/day.  Refer to high phosphorus foods and limit with goal of no more than [phone] mg per day. Read labels for carbohydrate. Can subtract fiber. Also, read labels for sodium. Choose from low and medium potassium foods. Limit high potassium foods.

## 2017-05-22 NOTE — Progress Notes (Signed)
Medical Nutrition Therapy: Visit start time: 1400   end time: 1500  Assessment:  Diagnosis:Type 2 Diabetes, Stage 4 kidney disease Past medical history: hypertension, hyperlipidemia Psychosocial issues/ stress concerns: Patient rates her stress as moderate and indicates "very well" as to how well she is dealing with her stress Preferred learning method:  . Auditory . Visual . Hands-on Current weight:141.9 lbs  Height:64 in Medications, supplements:see list  Progress and evaluation:  Patient accompanied by her daughter in for medical nutrition therapy.  She states that she tries to "stay on a diabetic diet". She reports her FBG's are typically in the 160's. Her HgA1c in 11/'18 was elevated at 8.3. She is also trying to stay away from foods that "are bad for my kidneys".  She starts her day with 10 oz of apple juice with her meds and often eats corn flakes or frosted flakes at breakfast; sometimes oatmeal. Her breakfast is at 10:00 or 11:00 because she sleeps late after staying up until 2:00 or 3:00 watching TV from her bed. She has 2 more meals at 2:00pm and 6:00pm. Her 2:00pm meal usually consists of a sandwich and juice although she has been drinking more sugar free flavored water as of late. Her 6:00pm meal is usually a baked meat and roasted vegetables such as brussels sprouts, squash or carrots. She usually eats a bedtime snack such as popcorn, or fruit or sugar free pudding. Her pattern of eating is somewhat erratic.   She is limiting her salt intake and her daughter has been looking at sodium content on labels but doesn't know the recommended amount not to exceed. Most of her meals are prepared at home. She nor her daughter have an understanding of carbohdyrate foods, portions, etc. Her daughter is very supportive and she and the patient were very engaged in the appointment asking excellent questions to understand and clarify.  Physical activity:no structured exercise.  Nutrition  Care Education:  Diabetes:  goals for BGs, appropriate meal and snack schedule, Carb counting, Instructed on a meal plan based on 1400 calories. Used diabetes food guide plate, "Planning a Balanced Meal" handout and food models to teach. Kidney Disease: Included in above instruction, dietary guidelines for kidney disease addressing protein, sodium, potassium and phosphorus. Gave and reviewed foods to include and foods to limit. Her potassium labs from 11/'18 indicated a normal level, but she states she has been told by MD to limit her potassium. Food/drug interaction: Instructed to avoid grapefruit juice and grapefruit due to possible interaction with the statin she takes. Nutritional Diagnosis:   Inconsistent carbohydrate intake as indicated by excessive carbohydrate through juice and cereal at breakfast and low carbohydrate intake for evening meal as as evidenced by diet history.  Food and nutrition-related knowledge deficit related to lack of knowledge regarding identifying carbohydrate foods and portions as evidenced by diet history.  Intervention: Include 3 meals spaced 4-5 hours apart. Balance meals with protein, 2-3 servings of carbohydrate ( mainly starch and fruit choices) and non-starchy vegetables. Avoid fruit juice or try a low calorie juice with no more than 5 gms carbohydrate/cup. Use Ms. DASH in place of salt. Sodium recommendations: less than 2000 mg/day.  Refer to high phosphorus foods and limit with goal of no more than 800 -1000mg  per day. Read labels for carbohydrate. Can subtract fiber. Also, read labels for sodium. Choose from low and medium potassium foods. Limit high potassium foods.  Education Materials given:  . Plate Planner . Food lists/ Planning A Balanced Meal .  Sample meal pattern/ menus . Eating Healthy with Kidney disease . Goals/ instructions Learner/ who was taught:  . Patient  . Family member: daughter  Level of understanding: . Partial  understanding; needs review/ practice Demonstrated degree of understanding via:   Teach back Learning barriers: . None Willingness to learn/ readiness for change: . Eager, change in progress  Monitoring and Evaluation:  Dietary intake, exercise, , and body weight      follow up: 06/19/17 at 1:30pm

## 2017-06-05 DIAGNOSIS — G4733 Obstructive sleep apnea (adult) (pediatric): Secondary | ICD-10-CM | POA: Diagnosis not present

## 2017-06-05 DIAGNOSIS — I10 Essential (primary) hypertension: Secondary | ICD-10-CM | POA: Diagnosis not present

## 2017-06-05 DIAGNOSIS — E1140 Type 2 diabetes mellitus with diabetic neuropathy, unspecified: Secondary | ICD-10-CM | POA: Diagnosis not present

## 2017-06-14 ENCOUNTER — Telehealth: Payer: Self-pay | Admitting: Family Medicine

## 2017-06-14 NOTE — Telephone Encounter (Signed)
Pt dropped off Rx form to be filled out this is for her test stripes. Please fax to [phone] when completed also call pt she would like a copy of it  Placed in Dr. Patsy Baltimore folder up front

## 2017-06-14 NOTE — Telephone Encounter (Signed)
In red folder to fill out

## 2017-06-15 NOTE — Telephone Encounter (Signed)
Pt dropped by today to see if paper was ready. Would you please call her when it is ready to be picked up.  Thanks

## 2017-06-15 NOTE — Telephone Encounter (Signed)
Please advise 

## 2017-06-15 NOTE — Telephone Encounter (Signed)
Completed. Please add my NPI number and make this available to pick up. Thanks.

## 2017-06-16 NOTE — Telephone Encounter (Signed)
Patient notified that I have faxed it, she states she does not need a copy

## 2017-06-19 ENCOUNTER — Ambulatory Visit: Payer: Medicare Other | Admitting: Dietician

## 2017-06-19 ENCOUNTER — Ambulatory Visit: Payer: Medicare Other | Admitting: Pharmacist

## 2017-06-26 ENCOUNTER — Encounter: Payer: Medicare Other | Attending: Family Medicine | Admitting: Dietician

## 2017-06-26 ENCOUNTER — Encounter: Payer: Self-pay | Admitting: Dietician

## 2017-06-26 VITALS — Wt 144.0 lb

## 2017-06-26 DIAGNOSIS — Z713 Dietary counseling and surveillance: Secondary | ICD-10-CM | POA: Diagnosis not present

## 2017-06-26 DIAGNOSIS — E1122 Type 2 diabetes mellitus with diabetic chronic kidney disease: Secondary | ICD-10-CM | POA: Diagnosis not present

## 2017-06-26 DIAGNOSIS — Z794 Long term (current) use of insulin: Secondary | ICD-10-CM | POA: Insufficient documentation

## 2017-06-26 DIAGNOSIS — N184 Chronic kidney disease, stage 4 (severe): Secondary | ICD-10-CM | POA: Diagnosis not present

## 2017-06-26 NOTE — Progress Notes (Signed)
Medical Nutrition Therapy Follow-up visit:  Time with patient: [id number]  Visit #:2 ASSESSMENT:  Diagnosis:Type 2 diabetes, Stage 4 kidney disease  Current weight: 144 lbs   Height: 64 in Medications: See list Medical History: hypertension, hyperlipidemia  Progress and evaluation: Patient, accompanied by her daughter, in for medical nutrition therapy follow-up appointment.  Her fasting blood glucose is typically in 140's to 160's.Some of her bedtime readings are in 200's. She has followed through to eat at more consistent times with meals spaced 4-5 hours apart. She has also eliminated the 10 oz of juice at breakfast. She states she continues to drink 12 oz of Coke daily but feels she can eliminate this.  She is not adding salt at table or in cooking. Her brother-in-law passed away and she is executor of the estate. She states she has been eating more "fast foods" at lunch while doing errands related to the estate. Most of her dinner meals are prepared at home consisting of baked meats, sweet potatoes for example  and roasted vegetables.  She typically eats an apple or popcorn for an evening snack. She reads labels for sodium and is limiting high potassium foods and high phosphorus foods. She plans to purchase some meals thru [employer]. Her daughter states that they will soon be living closer to each other and they can prepare some of the recipes from the cookbook I gave them.  Physical activity: She states she is moving more due to the errands associated with settling her brother-in-law's estate. No structured exercise.  NUTRITION CARE EDUCATION:  Diabetes:  Reviewed her glucose records  with her. Reviewed food guide plate and the recommended balance of protein,  2-3 carbohydrate servings and non-starchy vegetables. Looked up [employer] meal examples and showed how to look for those with no more than 50 gms of carbohydrate and 600 mg of sodium.  Kidney Disease:  Reviewed  dietary guidelines for kidney disease. Reviewed high phosphorus foods to limit. Discussed how preparing some of the recipes from the renal cookbook would help to decrease phosphate additives in processed foods.   INTERVENTION:  Continue with previous goals. When ordering the [employer] meals for diabetes, choose those with less than 600 mg sodium and less than 50 gms of carbohydrate. Eliminated Coke due to sugar and phosphorus. Eat popcorn that has no salt added.  Continue to balance meals with protein, 2-3 servings of carbohydrate and non-starchy vegetables. Walk for exercise with goal of 30 minutes daily.  EDUCATION MATERIALS GIVEN:  . Goals/ instructions  LEARNER/ who was taught:  . Patient  . Family member: daughter  LEVEL OF UNDERSTANDING: . Verbalizes/ demonstrates competency Demonstrated degree of understanding via teach back.  LEARNING BARRIERS: . None WILLINGNESS TO LEARN/READINESS FOR CHANGE:  Change in progress MONITORING AND EVALUATION:   No follow-up scheduled. Encouraged patient to call if she has further questions or desires additional help with her meal plan.

## 2017-06-26 NOTE — Patient Instructions (Addendum)
Continue with previous goals. When ordering the [employer] meals for diabetes, choose those with less than 600 mg sodium and less than 50 gms of carbohydrate. Eliminated Coke due to sugar and phosphorus. Eat popcorn that has no salt added.  Continue to balance meals with protein, 2-3 servings of carbohydrate and non-starchy vegetables. Walk for exercise with goal of 30 minutes daily.

## 2017-07-12 DIAGNOSIS — N184 Chronic kidney disease, stage 4 (severe): Secondary | ICD-10-CM | POA: Diagnosis not present

## 2017-07-12 DIAGNOSIS — E1129 Type 2 diabetes mellitus with other diabetic kidney complication: Secondary | ICD-10-CM | POA: Diagnosis not present

## 2017-07-12 DIAGNOSIS — I10 Essential (primary) hypertension: Secondary | ICD-10-CM | POA: Diagnosis not present

## 2017-07-13 DIAGNOSIS — N10 Acute tubulo-interstitial nephritis: Secondary | ICD-10-CM | POA: Diagnosis not present

## 2017-07-13 DIAGNOSIS — N184 Chronic kidney disease, stage 4 (severe): Secondary | ICD-10-CM | POA: Diagnosis not present

## 2017-07-13 DIAGNOSIS — E1129 Type 2 diabetes mellitus with other diabetic kidney complication: Secondary | ICD-10-CM | POA: Diagnosis not present

## 2017-07-17 ENCOUNTER — Other Ambulatory Visit: Payer: Self-pay | Admitting: Family Medicine

## 2017-07-19 ENCOUNTER — Other Ambulatory Visit: Payer: Self-pay | Admitting: Family Medicine

## 2017-07-19 DIAGNOSIS — E785 Hyperlipidemia, unspecified: Secondary | ICD-10-CM

## 2017-07-25 ENCOUNTER — Other Ambulatory Visit: Payer: Self-pay

## 2017-07-25 ENCOUNTER — Ambulatory Visit (INDEPENDENT_AMBULATORY_CARE_PROVIDER_SITE_OTHER): Payer: Medicare Other | Admitting: Family Medicine

## 2017-07-25 ENCOUNTER — Encounter: Payer: Self-pay | Admitting: Family Medicine

## 2017-07-25 VITALS — BP 164/80 | HR 68 | Temp 97.6°F | Wt 144.0 lb

## 2017-07-25 DIAGNOSIS — Z794 Long term (current) use of insulin: Secondary | ICD-10-CM | POA: Diagnosis not present

## 2017-07-25 DIAGNOSIS — N184 Chronic kidney disease, stage 4 (severe): Secondary | ICD-10-CM | POA: Diagnosis not present

## 2017-07-25 DIAGNOSIS — E1122 Type 2 diabetes mellitus with diabetic chronic kidney disease: Secondary | ICD-10-CM | POA: Diagnosis not present

## 2017-07-25 DIAGNOSIS — K625 Hemorrhage of anus and rectum: Secondary | ICD-10-CM | POA: Diagnosis not present

## 2017-07-25 DIAGNOSIS — F418 Other specified anxiety disorders: Secondary | ICD-10-CM | POA: Insufficient documentation

## 2017-07-25 DIAGNOSIS — Z1231 Encounter for screening mammogram for malignant neoplasm of breast: Secondary | ICD-10-CM | POA: Diagnosis not present

## 2017-07-25 DIAGNOSIS — Z1239 Encounter for other screening for malignant neoplasm of breast: Secondary | ICD-10-CM

## 2017-07-25 DIAGNOSIS — I10 Essential (primary) hypertension: Secondary | ICD-10-CM | POA: Diagnosis not present

## 2017-07-25 DIAGNOSIS — F325 Major depressive disorder, single episode, in full remission: Secondary | ICD-10-CM

## 2017-07-25 DIAGNOSIS — F419 Anxiety disorder, unspecified: Secondary | ICD-10-CM

## 2017-07-25 DIAGNOSIS — Z780 Asymptomatic menopausal state: Secondary | ICD-10-CM | POA: Diagnosis not present

## 2017-07-25 DIAGNOSIS — E785 Hyperlipidemia, unspecified: Secondary | ICD-10-CM | POA: Diagnosis not present

## 2017-07-25 DIAGNOSIS — F329 Major depressive disorder, single episode, unspecified: Secondary | ICD-10-CM | POA: Insufficient documentation

## 2017-07-25 NOTE — Assessment & Plan Note (Signed)
Asymptomatic on Paxil.  She wants to continue this.  She will continue to monitor.

## 2017-07-25 NOTE — Assessment & Plan Note (Signed)
With a long intermittent history of this.  Reports rectal prolapse previously with rectopexy in the past.  Some evidence of prior external hemorrhoids on rectal exam.  No evidence of rectal prolapse.  Negative FOBT.  Vital signs are stable.  Will refer to GI to consider colonoscopy.  May need to see a surgeon depending on results.  Given return precautions.

## 2017-07-25 NOTE — Patient Instructions (Signed)
Nice to see you. Please start monitoring your blood pressure at home.  We will have you return in 1 week for recheck. Will have you return for fasting lab work in the next several days. We will get you to see GI for colonoscopy. We will get you set up for mammogram and bone density scan.

## 2017-07-25 NOTE — Assessment & Plan Note (Signed)
Has recently been under better control.  She will check daily for a week and return for nurse BP check.  If elevated at home would consider addition of other medication.

## 2017-07-25 NOTE — Assessment & Plan Note (Addendum)
Return for fasting lipid panel.  Continue Lipitor.

## 2017-07-25 NOTE — Progress Notes (Signed)
Tommi Rumps, MD Phone: [phone]  Kelly [name] is a 77 y.o. female who presents today for f/u.  DIABETES Disease Monitoring: Blood Sugar ranges-30 day avg 147 Polyuria/phagia/dipsia- dipsia      Optho- UTD Medications: Compliance- basaglar 10 units, victoza Hypoglycemic symptoms- no  HYPERLIPIDEMIA Symptoms Chest pain on exertion:  no Medications: Compliance- taking lipitor Right upper quadrant pain- no  Muscle aches- no  Depression: no symptoms. Has been on paxil for years. Notes when she has come off in the past she will get depressed. Rarely takes xanax for anxiety.  Associated drowsiness.  No SI.  She reports she has had rectal prolapse on multiple occasions previously and she underwent rectopexy twice before.  Notes intermittent rectal bleeding with mucus.  It has been sometime since she has been evaluated for this.  Notes it has been about 10 years since her last colonoscopy.  Has not been checking her blood pressure recently.  She does take Lasix.  Social History   Tobacco Use  Smoking Status Never Smoker  Smokeless Tobacco Never Used     ROS see history of present illness  Objective  Physical Exam Vitals:   07/25/17 0956  BP: (!) 164/80  Pulse: 68  Temp: 97.6 F (36.4 C)  SpO2: 95%    BP Readings from Last 3 Encounters:  07/25/17 (!) 164/80  04/26/17 130/70  04/03/17 136/82   Wt Readings from Last 3 Encounters:  07/25/17 144 lb (65.3 kg)  06/26/17 144 lb (65.3 kg)  05/22/17 141 lb 14.4 oz (64.4 kg)    Physical Exam  Constitutional: No distress.  Cardiovascular: Normal rate, regular rhythm and normal heart sounds.  Pulmonary/Chest: Effort normal and breath sounds normal.  Genitourinary:  Genitourinary Comments: Rectum with external skin tags from likely hemorrhoids, no prolapse noted, normal rectal tone, stool in vault with negative FOBT, no blood noted  Musculoskeletal: She exhibits no edema.  Neurological: She is alert. Gait  normal.  Skin: Skin is warm and dry. She is not diaphoretic.     Assessment/Plan: Please see individual problem list.  DM (diabetes mellitus), type 2 with renal complications ([place]) We will have her return for fasting labs.  Continue current regimen.  Hyperlipidemia Return for fasting lipid panel.  Continue Lipitor.  Depression, major, single episode, complete remission ([place]) Asymptomatic on Paxil.  She wants to continue this.  She will continue to monitor.  Rectal bleeding With a long intermittent history of this.  Reports rectal prolapse previously with rectopexy in the past.  Some evidence of prior external hemorrhoids on rectal exam.  No evidence of rectal prolapse.  Negative FOBT.  Vital signs are stable.  Will refer to GI to consider colonoscopy.  May need to see a surgeon depending on results.  Given return precautions.  Hypertension Has recently been under better control.  She will check daily for a week and return for nurse BP check.  If elevated at home would consider addition of other medication.   Health Maintenance: Mammogram ordered.  Bone density scan ordered.  Orders Placed This Encounter  Procedures  . MM SCREENING BREAST TOMO BILATERAL    Standing Status:   Future    Standing Expiration Date:   09/25/2018    Order Specific Question:   Reason for Exam (SYMPTOM  OR DIAGNOSIS REQUIRED)    Answer:   breast cancer screening    Order Specific Question:   Preferred imaging location?    Answer:   [facility]  . DG Bone  Density    Standing Status:   Future    Standing Expiration Date:   09/25/2018    Order Specific Question:   Reason for Exam (SYMPTOM  OR DIAGNOSIS REQUIRED)    Answer:   post menopausal estrogen deficiency    Order Specific Question:   Preferred [facility] location?    [facility]  . HgB A1c    Standing Status:   Future    Standing Expiration Date:   07/26/2018  . Lipid panel    Standing Status:   Future    Standing Expiration  Date:   07/26/2018  . Urine Microalbumin w/creat. ratio    Standing Status:   Future    Standing Expiration Date:   07/26/2018  . Ambulatory referral to Gastroenterology    Referral Priority:   Routine    Referral Type:   Consultation    Referral Reason:   Specialty Services Required    Number of Visits Requested:   1    No orders of the defined types were placed in this encounter.    Tommi Rumps, MD [place]

## 2017-07-25 NOTE — Assessment & Plan Note (Addendum)
We will have her return for fasting labs.  Continue current regimen.

## 2017-07-26 ENCOUNTER — Telehealth: Payer: Self-pay | Admitting: Family Medicine

## 2017-07-26 NOTE — Telephone Encounter (Signed)
fyi

## 2017-07-26 NOTE — Telephone Encounter (Signed)
Noted.  Please add this to her medication list.

## 2017-07-26 NOTE — Telephone Encounter (Signed)
Copied from [place] [id number]. Topic: Quick Communication - See Telephone Encounter >> Jul 26, 2017  2:02 PM Synthia Innocent wrote: CRM for notification. See Telephone encounter for:  Daughter calling to make Dr Josephina Gip aware that Dr Candiss Norse put her on Losartan 50mg  07/26/17.

## 2017-07-27 NOTE — Telephone Encounter (Signed)
added

## 2017-07-29 DIAGNOSIS — H10502 Unspecified blepharoconjunctivitis, left eye: Secondary | ICD-10-CM | POA: Diagnosis not present

## 2017-07-31 ENCOUNTER — Other Ambulatory Visit: Payer: Medicare Other

## 2017-08-01 ENCOUNTER — Other Ambulatory Visit: Payer: Medicare Other

## 2017-08-02 ENCOUNTER — Other Ambulatory Visit: Payer: Self-pay | Admitting: Family Medicine

## 2017-08-02 ENCOUNTER — Ambulatory Visit: Payer: Medicare Other | Admitting: *Deleted

## 2017-08-02 ENCOUNTER — Other Ambulatory Visit (INDEPENDENT_AMBULATORY_CARE_PROVIDER_SITE_OTHER): Payer: Medicare Other

## 2017-08-02 ENCOUNTER — Encounter: Payer: Self-pay | Admitting: *Deleted

## 2017-08-02 VITALS — BP 170/80 | HR 68 | Resp 16

## 2017-08-02 DIAGNOSIS — I10 Essential (primary) hypertension: Secondary | ICD-10-CM

## 2017-08-02 DIAGNOSIS — N184 Chronic kidney disease, stage 4 (severe): Secondary | ICD-10-CM

## 2017-08-02 DIAGNOSIS — E785 Hyperlipidemia, unspecified: Secondary | ICD-10-CM | POA: Diagnosis not present

## 2017-08-02 DIAGNOSIS — Z794 Long term (current) use of insulin: Secondary | ICD-10-CM

## 2017-08-02 DIAGNOSIS — E1122 Type 2 diabetes mellitus with diabetic chronic kidney disease: Secondary | ICD-10-CM | POA: Diagnosis not present

## 2017-08-02 LAB — HEMOGLOBIN A1C: Hgb A1c MFr Bld: 7.6 % — ABNORMAL HIGH (ref 4.6–6.5)

## 2017-08-02 LAB — MICROALBUMIN / CREATININE URINE RATIO
Creatinine,U: 167.7 mg/dL
Microalb Creat Ratio: 2.7 mg/g (ref 0.0–30.0)
Microalb, Ur: 4.5 mg/dL — ABNORMAL HIGH (ref 0.0–1.9)

## 2017-08-02 LAB — LIPID PANEL
Cholesterol: 133 mg/dL (ref 0–200)
HDL: 30.3 mg/dL — ABNORMAL LOW (ref >39.00)
LDL Cholesterol: 77 mg/dL (ref 0–99)
NonHDL: 102.75
Total CHOL/HDL Ratio: 4
Triglycerides: 131 mg/dL (ref 0.0–149.0)
VLDL: 26.2 mg/dL (ref 0.0–40.0)

## 2017-08-02 NOTE — Progress Notes (Signed)
Patient presented for BP follow up 07/25/17, patient has not checked BP readings at home in office BP today left arm 170/80 pulse 68.

## 2017-08-02 NOTE — Telephone Encounter (Signed)
Request for refill of Paroxetine (Paxil-CR) 25mg  24hr tab. Medication previously filled by another provider.   LOV: 07/25/17  Dr. Caryl Bis   [facility]

## 2017-08-02 NOTE — Telephone Encounter (Signed)
Copied from [place] [id number]. Topic: Quick Communication - See Telephone Encounter >> Aug 02, 2017  1:51 PM Conception Chancy, NT wrote: CRM for notification. See Telephone encounter for: 08/02/17.  Patient is calling and requesting a refill on PARoxetine (PAXIL-CR) 25 MG 24 hr tablet  she states she usually gets a 90 day supply and they told her she needed to ask for 2 refills. She states her provider before Dr. Caryl Bis was prescribing this. Please advise patient if she needs to be seen to get this filled.  [facility] [place] [place] [facility] [address] [place] [address]  Phone: [phone] Fax: [phone]

## 2017-08-05 MED ORDER — PAROXETINE HCL ER 25 MG PO TB24: 25.0000 mg | ORAL_TABLET | Freq: Every day | ORAL | 3 refills | Status: DC

## 2017-08-05 NOTE — Telephone Encounter (Signed)
Sent to pharmacy 

## 2017-08-08 ENCOUNTER — Telehealth: Payer: Self-pay | Admitting: *Deleted

## 2017-08-08 NOTE — Telephone Encounter (Signed)
° °  Pt called back and said it will be ok for the med to be called in to [facility]

## 2017-08-08 NOTE — Telephone Encounter (Signed)
Tried to reach patient concerning below pasted message from PCP no answer left message to call office . PEC may advise of message ,  Attestation signed by [name], MD at 08/03/2017 4:41 PM  Blood pressure is not at goal.  We should consider adding a medication such as amlodipine to her regimen.  If she is okay doing this I can send this to her pharmacy.

## 2017-08-09 NOTE — Telephone Encounter (Signed)
See CC'D chart message to PCP.

## 2017-08-09 NOTE — Progress Notes (Signed)
Patient ok with additional BP medication being added please call to [facility].

## 2017-08-10 MED ORDER — AMLODIPINE BESYLATE 2.5 MG PO TABS: 2.5000 mg | ORAL_TABLET | Freq: Every day | ORAL | 3 refills | Status: DC

## 2017-08-10 NOTE — Addendum Note (Signed)
Addended by: [name] on: 08/10/2017 04:13 PM   Modules accepted: Orders

## 2017-08-10 NOTE — Progress Notes (Signed)
Sent to pharmacy. She will need a BP check in one month. Thanks.

## 2017-08-15 ENCOUNTER — Ambulatory Visit: Payer: Medicare Other | Admitting: Gastroenterology

## 2017-08-15 ENCOUNTER — Ambulatory Visit (INDEPENDENT_AMBULATORY_CARE_PROVIDER_SITE_OTHER): Payer: Medicare Other | Admitting: Gastroenterology

## 2017-08-15 ENCOUNTER — Encounter: Payer: Self-pay | Admitting: Gastroenterology

## 2017-08-15 VITALS — BP 134/75 | HR 79 | Temp 98.3°F | Resp 16 | Ht 64.0 in | Wt 142.0 lb

## 2017-08-15 DIAGNOSIS — M2020 Hallux rigidus, unspecified foot: Secondary | ICD-10-CM | POA: Insufficient documentation

## 2017-08-15 DIAGNOSIS — M47812 Spondylosis without myelopathy or radiculopathy, cervical region: Secondary | ICD-10-CM | POA: Insufficient documentation

## 2017-08-15 DIAGNOSIS — M6281 Muscle weakness (generalized): Secondary | ICD-10-CM | POA: Insufficient documentation

## 2017-08-15 DIAGNOSIS — M7070 Other bursitis of hip, unspecified hip: Secondary | ICD-10-CM | POA: Insufficient documentation

## 2017-08-15 DIAGNOSIS — M25559 Pain in unspecified hip: Secondary | ICD-10-CM | POA: Insufficient documentation

## 2017-08-15 DIAGNOSIS — M5030 Other cervical disc degeneration, unspecified cervical region: Secondary | ICD-10-CM | POA: Insufficient documentation

## 2017-08-15 DIAGNOSIS — M654 Radial styloid tenosynovitis [de Quervain]: Secondary | ICD-10-CM | POA: Insufficient documentation

## 2017-08-15 DIAGNOSIS — M542 Cervicalgia: Secondary | ICD-10-CM | POA: Insufficient documentation

## 2017-08-15 DIAGNOSIS — M25519 Pain in unspecified shoulder: Secondary | ICD-10-CM | POA: Insufficient documentation

## 2017-08-15 DIAGNOSIS — Q762 Congenital spondylolisthesis: Secondary | ICD-10-CM | POA: Insufficient documentation

## 2017-08-15 DIAGNOSIS — K625 Hemorrhage of anus and rectum: Secondary | ICD-10-CM | POA: Diagnosis not present

## 2017-08-15 DIAGNOSIS — R29890 Loss of height: Secondary | ICD-10-CM | POA: Insufficient documentation

## 2017-08-15 DIAGNOSIS — N393 Stress incontinence (female) (male): Secondary | ICD-10-CM | POA: Insufficient documentation

## 2017-08-15 DIAGNOSIS — M810 Age-related osteoporosis without current pathological fracture: Secondary | ICD-10-CM | POA: Insufficient documentation

## 2017-08-15 MED ORDER — PEG 3350-KCL-NA BICARB-NACL 420 G PO SOLR: 4000.0000 mL | Freq: Once | ORAL | 0 refills | Status: AC

## 2017-08-15 NOTE — Addendum Note (Signed)
Addended by: Peggye Ley on: 08/15/2017 03:26 PM   Modules accepted: Orders, SmartSet

## 2017-08-15 NOTE — Progress Notes (Signed)
Jonathon Bellows MD, MRCP(U.K) [address]  [place]  [place], [place] [address]  Main: [phone]  Fax: [phone]   Gastroenterology Consultation  Referring Provider:     [name], MD Primary Care Physician:  [name], MD Primary Gastroenterologist:  Dr. Jonathon Bellows  Reason for Consultation:     Rectal bleeding         HPI:   Kelly [name] is a 77 y.o. y/o female referred for consultation & management  by Dr. Caryl Bis, Angela Adam, MD.    She is here today to see me for rectal bleeding. H/o rectal prolapse in the past and has had surgery. > 10 years since last colonoscopy .  CBC 03/2017 - Hb 13.6    Rectal bleeding :  Onset and where was blood seen  :ongoing for several years, on and off, sees blood mixed with the stool, on the paper, in the bowel . Got better recently. Some times has large qty and sometimes is less. Always wear a pad for incontinence  Frequency of bowel movements :has a bowel movement 1-2 times a day  Consistency : formed  Change in shape of stool:no  Pain associated with bowel movements:no  Blood thinner usage:baby asprin  NSAID's: no  Prior colonoscopy :yes  Family history of colon cancer or polyps:no  Weight loss:no   Past Medical History:  Diagnosis Date  . Colon polyps   . Depression   . DM (diabetes mellitus), type 2 with renal complications ([place])   . Hyperlipidemia   . Hypertension   . Kidney stones   . 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     Past Surgical History:  Procedure Laterality Date  . APPENDECTOMY    . cataract  03/2017  . CHOLECYSTECTOMY    . COLONOSCOPY    . ESOPHAGOGASTRODUODENOSCOPY    . ESOPHAGOGASTRODUODENOSCOPY (EGD) WITH PROPOFOL N/A 03/23/2016   Procedure: ESOPHAGOGASTRODUODENOSCOPY (EGD) WITH PROPOFOL;  Surgeon: Manya Silvas, MD;  [facility] END[facility]COPY;  Service: Endoscopy;  Laterality: N/A;  . RECTAL PROLAPSE REPAIR  2012   x2    Prior to  Admission medications   Medication Sig Start Date End Date Taking? Authorizing Provider  ALPRAZolam Duanne Moron) 0.5 MG tablet Take 1 tablet (0.5 mg total) by mouth 2 (two) times daily as needed for anxiety. 01/17/17  Yes Cook, [name], DO  amLODipine (NORVASC) 2.5 MG tablet Take 1 tablet (2.5 mg total) by mouth daily. 08/10/17  Yes [name], MD  aspirin EC 81 MG tablet Take 81 mg by mouth daily.   Yes [provider]  atorvastatin (LIPITOR) 10 MG tablet TAKE 1 TABLET DAILY 02/20/17  Yes [name], MD  calcium citrate-vitamin D (CITRACAL+D) 315-200 MG-UNIT tablet Take 1 tablet by mouth 2 (two) times daily.   Yes [provider]  cyclobenzaprine (FLEXERIL) 10 MG tablet Take 1 tablet (10 mg total) by mouth 3 (three) times daily as needed for muscle spasms. 09/20/16  Yes Cook, [name], DO  furosemide (LASIX) 40 MG tablet Take 1 tablet (40 mg total) by mouth daily as needed. 01/11/17  Yes Cook, [name], DO  gabapentin (NEURONTIN) 300 MG capsule Take 1 capsule (300 mg total) by mouth at bedtime. 07/18/17  Yes [name], MD  glucose blood (IGLUCOSE TEST STRIPS) test strip Use as instructed 11/04/16  Yes Cook, [name], DO  Insulin Glargine (BASAGLAR KWIKPEN) 100 UNIT/ML SOPN Inject 0.1 mLs (10  Units total) into the skin at bedtime. Patient taking differently: Inject 10 Units into the skin at bedtime.  04/03/17  Yes Crecencio Mc, MD  Insulin Pen Needle (PEN NEEDLES) 32G X 4 MM MISC 100 each by Does not apply route daily. 01/02/17  Yes [name], MD  Lancets MISC Use up to 4 times daily to check blood sugars. 11/04/16  Yes Cook, [name], DO  losartan (COZAAR) 50 MG tablet Take 50 mg by mouth daily. 07/17/17  Yes [provider]  PARoxetine (PAXIL-CR) 25 MG 24 hr tablet Take 1 tablet (25 mg total) by mouth daily. 08/05/17  Yes [name], MD  potassium chloride (K-DUR) 10 MEQ tablet  07/17/17  Yes [provider]  trimethoprim-polymyxin b  (POLYTRIM) ophthalmic solution  07/29/17  Yes [provider]  VICTOZA 18 MG/3ML SOPN INJECT 0.2ML (=1.2MG )      SUBCUTANEOUSLY DAILY 07/20/17  Yes [name], MD  fluorouracil (EFUDEX) 5 % cream fluorouracil 5 % topical cream    [provider]    Family History  Problem Relation Age of Onset  . Stroke Mother   . Arthritis Mother   . Stroke Sister   . Stroke Sister   . Lung cancer Sister   . Heart attack Father 82     Social History   Tobacco Use  . Smoking status: Never Smoker  . Smokeless tobacco: Never Used  Substance Use Topics  . Alcohol use: No  . Drug use: No    Allergies as of 08/15/2017 - Review Complete 08/15/2017  Allergen Reaction Noted  . Prilosec otc [omeprazole magnesium] Other (See Comments) 07/20/2016  . Sucralfate Other (See Comments) 07/20/2016  . Amoxicillin Diarrhea 06/21/2015  . Morphine and related Other (See Comments) 06/21/2015    Review of Systems:    All systems reviewed and negative except where noted in HPI.   Physical Exam:  BP 134/75   Pulse 79   Temp 98.3 F (36.8 C) (Oral)   Resp 16   Ht 5\' 4"  (1.626 m)   Wt 142 lb (64.4 kg)   SpO2 97%   BMI 24.37 kg/m  No LMP recorded. Patient is postmenopausal. Psych:  Alert and cooperative. Normal mood and affect. General:   Alert,  Well-developed, well-nourished, pleasant and cooperative in NAD Head:  Normocephalic and atraumatic. Eyes:  Sclera clear, no icterus.   Conjunctiva pink. Ears:  Normal auditory acuity. Nose:  No deformity, discharge, or lesions. Mouth:  No deformity or lesions,oropharynx pink & moist. Neck:  Supple; no masses or thyromegaly. Lungs:  Respirations even and unlabored.  Clear throughout to auscultation.   No wheezes, crackles, or rhonchi. No acute distress. Heart:  Regular rate and rhythm; no murmurs, clicks, rubs, or gallops. Abdomen:  Normal bowel sounds.  No bruits.  Soft, non-tender and non-distended without masses, hepatosplenomegaly or  hernias noted.  No guarding or rebound tenderness.    Neurologic:  Alert and oriented x3;  grossly normal neurologically. Skin:  Intact without significant lesions or rashes. No jaundice. Lymph Nodes:  No significant cervical adenopathy. Psych:  Alert and cooperative. Normal mood and affect.  Imaging Studies: No results found.  Assessment and Plan:   Shunda LAGRETTA LOSEKE is a 77 y.o. y/o female has been referred for rectal bleeding    Plan  1. Colonoscopy   I have discussed alternative options, risks & benefits,  which include, but are not limited to, bleeding, infection, perforation,respiratory complication & drug reaction.  The patient agrees with this plan & written consent will be obtained.     Follow up PRN  Dr Jonathon Bellows MD,MRCP(U.K)

## 2017-08-17 NOTE — Progress Notes (Signed)
Patient scheduled.

## 2017-08-18 ENCOUNTER — Encounter: Payer: Self-pay | Admitting: *Deleted

## 2017-08-22 ENCOUNTER — Ambulatory Visit
Admission: RE | Admit: 2017-08-22 | Discharge: 2017-08-22 | Disposition: A | Discharge status: Home / Self Care | Payer: Medicare Other | Source: Ambulatory Visit | Attending: Gastroenterology | Admitting: Gastroenterology

## 2017-08-22 ENCOUNTER — Encounter: Payer: Self-pay | Admitting: *Deleted

## 2017-08-22 ENCOUNTER — Other Ambulatory Visit: Payer: Self-pay | Admitting: Gastroenterology

## 2017-08-22 ENCOUNTER — Ambulatory Visit: Payer: Medicare Other | Admitting: Certified Registered Nurse Anesthetist

## 2017-08-22 ENCOUNTER — Encounter
Admission: RE | Disposition: A | Discharge status: Home / Self Care | Payer: Self-pay | Source: Ambulatory Visit | Attending: Gastroenterology

## 2017-08-22 DIAGNOSIS — K5730 Diverticulosis of large intestine without perforation or abscess without bleeding: Secondary | ICD-10-CM | POA: Diagnosis not present

## 2017-08-22 DIAGNOSIS — N184 Chronic kidney disease, stage 4 (severe): Secondary | ICD-10-CM | POA: Diagnosis not present

## 2017-08-22 DIAGNOSIS — Z794 Long term (current) use of insulin: Secondary | ICD-10-CM | POA: Diagnosis not present

## 2017-08-22 DIAGNOSIS — E785 Hyperlipidemia, unspecified: Secondary | ICD-10-CM | POA: Diagnosis not present

## 2017-08-22 DIAGNOSIS — K625 Hemorrhage of anus and rectum: Secondary | ICD-10-CM | POA: Diagnosis not present

## 2017-08-22 DIAGNOSIS — G4730 Sleep apnea, unspecified: Secondary | ICD-10-CM | POA: Insufficient documentation

## 2017-08-22 DIAGNOSIS — Z79899 Other long term (current) drug therapy: Secondary | ICD-10-CM | POA: Insufficient documentation

## 2017-08-22 DIAGNOSIS — I10 Essential (primary) hypertension: Secondary | ICD-10-CM | POA: Insufficient documentation

## 2017-08-22 DIAGNOSIS — K629 Disease of anus and rectum, unspecified: Secondary | ICD-10-CM | POA: Diagnosis not present

## 2017-08-22 DIAGNOSIS — E1122 Type 2 diabetes mellitus with diabetic chronic kidney disease: Secondary | ICD-10-CM | POA: Diagnosis not present

## 2017-08-22 DIAGNOSIS — Z85828 Personal history of other malignant neoplasm of skin: Secondary | ICD-10-CM | POA: Insufficient documentation

## 2017-08-22 DIAGNOSIS — K635 Polyp of colon: Secondary | ICD-10-CM | POA: Diagnosis not present

## 2017-08-22 DIAGNOSIS — E119 Type 2 diabetes mellitus without complications: Secondary | ICD-10-CM | POA: Insufficient documentation

## 2017-08-22 DIAGNOSIS — F329 Major depressive disorder, single episode, unspecified: Secondary | ICD-10-CM | POA: Insufficient documentation

## 2017-08-22 DIAGNOSIS — K6389 Other specified diseases of intestine: Secondary | ICD-10-CM | POA: Diagnosis not present

## 2017-08-22 DIAGNOSIS — D122 Benign neoplasm of ascending colon: Secondary | ICD-10-CM | POA: Diagnosis not present

## 2017-08-22 DIAGNOSIS — I129 Hypertensive chronic kidney disease with stage 1 through stage 4 chronic kidney disease, or unspecified chronic kidney disease: Secondary | ICD-10-CM | POA: Diagnosis not present

## 2017-08-22 DIAGNOSIS — G4733 Obstructive sleep apnea (adult) (pediatric): Secondary | ICD-10-CM | POA: Diagnosis not present

## 2017-08-22 HISTORY — DX: Sleep apnea, unspecified: G47.30

## 2017-08-22 HISTORY — PX: COLONOSCOPY WITH PROPOFOL: SHX5780

## 2017-08-22 LAB — GLUCOSE, CAPILLARY: Glucose-Capillary: 85 mg/dL (ref 65–99)

## 2017-08-22 SURGERY — COLONOSCOPY WITH PROPOFOL
Anesthesia: General

## 2017-08-22 MED ORDER — PHENYLEPHRINE HCL 10 MG/ML IJ SOLN
INTRAMUSCULAR | Status: AC
  Filled 2017-08-22: qty 1

## 2017-08-22 MED ORDER — PHENYLEPHRINE HCL 10 MG/ML IJ SOLN
INTRAMUSCULAR | Status: DC | PRN
  Administered 2017-08-22: 100 ug via INTRAVENOUS

## 2017-08-22 MED ORDER — PROPOFOL 10 MG/ML IV BOLUS
INTRAVENOUS | Status: AC
  Filled 2017-08-22: qty 80

## 2017-08-22 MED ORDER — SODIUM CHLORIDE 0.9 % IV SOLN
INTRAVENOUS | Status: DC
  Administered 2017-08-22: 1000 mL via INTRAVENOUS
  Administered 2017-08-22: 08:00:00 via INTRAVENOUS

## 2017-08-22 MED ORDER — LIDOCAINE HCL (CARDIAC) 20 MG/ML IV SOLN
INTRAVENOUS | Status: DC | PRN
  Administered 2017-08-22: 80 mg via INTRAVENOUS

## 2017-08-22 MED ORDER — PROPOFOL 10 MG/ML IV BOLUS
INTRAVENOUS | Status: DC | PRN
  Administered 2017-08-22: 50 mg via INTRAVENOUS
  Administered 2017-08-22: 30 mg via INTRAVENOUS

## 2017-08-22 MED ORDER — EPHEDRINE SULFATE 50 MG/ML IJ SOLN
INTRAMUSCULAR | Status: AC
  Filled 2017-08-22: qty 1

## 2017-08-22 MED ORDER — PROPOFOL 500 MG/50ML IV EMUL
INTRAVENOUS | Status: DC | PRN
  Administered 2017-08-22: 80 ug/kg/min via INTRAVENOUS

## 2017-08-22 NOTE — Op Note (Signed)
[facility] Gastroenterology Patient Name: Kelly [name] Procedure Date: 08/22/2017 8:02 AM MRN: [id number] Account #: [id number] Date of Birth: 04/02/1941 Admit Type: Outpatient Age: 77 Room: [facility] ENDO ROOM 1 Gender: Female Note Status: Finalized Procedure:            Colonoscopy Indications:          Rectal bleeding Providers:            Jonathon Bellows MD, MD Referring MD:         Angela Adam. Caryl Bis (Referring MD) Medicines:            Monitored Anesthesia Care Complications:        No immediate complications. Procedure:            Pre-Anesthesia Assessment:                       - Prior to the procedure, a History and Physical was                        performed, and patient medications, allergies and                        sensitivities were reviewed. The patient's tolerance of                        previous anesthesia was reviewed.                       - The risks and benefits of the procedure and the                        sedation options and risks were discussed with the                        patient. All questions were answered and informed                        consent was obtained.                       - ASA Grade Assessment: III - A patient with severe                        systemic disease.                       After obtaining informed consent, the colonoscope was                        passed under direct vision. Throughout the procedure,                        the patient's blood pressure, pulse, and oxygen                        saturations were monitored continuously. The                        Colonoscope was introduced through the anus and                        advanced to  the the cecum, identified by the                        appendiceal orifice, IC valve and transillumination.                        The colonoscopy was performed with ease. The patient                        tolerated the procedure well. The quality of the bowel                        preparation was good. Findings:      The perianal and digital rectal examinations were normal.      A 10 mm polyp was found in the ascending colon. The polyp was sessile.       The polyp was removed with a cold snare. Resection and retrieval were       complete. To prevent bleeding after the polypectomy, three hemostatic       clips were successfully placed. There was no bleeding during, or at the       end, of the procedure.      Multiple small-mouthed diverticula were found in the sigmoid colon.      A sessile non-obstructing medium-sized mass was found in the distal       rectum. The mass was partially circumferential (involving one-third of       the lumen circumference). The mass measured three cm in length. No       bleeding was present. The mass extended to the dentate line Biopsies       were taken with a cold forceps for histology.      The exam was otherwise without abnormality. Impression:           - One 10 mm polyp in the ascending colon, removed with                        a cold snare. Resected and retrieved. Clips were placed.                       - Diverticulosis in the sigmoid colon.                       - Rule out malignancy, tumor in the distal rectum.                        Biopsied.                       - The examination was otherwise normal. Recommendation:       - Discharge patient to home (with escort).                       - Resume previous diet.                       - Continue present medications.                       - Await pathology results.                       -  Return to my office in 1 week. Procedure Code(s):    --- Professional ---                       [phone], Colonoscopy, flexible; with removal of tumor(s),                        polyp(s), or other lesion(s) by snare technique                       45380, 47, Colonoscopy, flexible; with biopsy, single                        or multiple Diagnosis Code(s):    ---  Professional ---                       D12.2, Benign neoplasm of ascending colon                       D49.0, Neoplasm of unspecified behavior of digestive                        system                       K62.5, Hemorrhage of anus and rectum                       K57.30, Diverticulosis of large intestine without                        perforation or abscess without bleeding CPT copyright 2017 American Medical Association. All rights reserved. The codes documented in this report are preliminary and upon coder review may  be revised to meet current compliance requirements. Jonathon Bellows, MD Jonathon Bellows MD, MD 08/22/2017 8:32:19 AM This report has been signed electronically. Number of Addenda: 0 Note Initiated On: 08/22/2017 8:02 AM Scope Withdrawal Time: 0 hours 18 minutes 12 seconds  Total Procedure Duration: 0 hours 22 minutes 19 seconds       [facility]

## 2017-08-22 NOTE — Transfer of Care (Signed)
Immediate Anesthesia Transfer of Care Note  Patient: Kelly [name]  Procedure(s) Performed: COLONOSCOPY WITH PROPOFOL (N/A )  Patient Location: PACU and Endoscopy Unit  Anesthesia Type:General  Level of Consciousness: awake, drowsy and patient cooperative  Airway & Oxygen Therapy: Patient Spontanous Breathing and Patient connected to nasal cannula oxygen  Post-op Assessment: Report given to RN, Post -op Vital signs reviewed and stable and Patient moving all extremities  Post vital signs: Reviewed and stable  Last Vitals:  Vitals Value Taken Time  BP 136/68 08/22/2017  8:33 AM  Temp 36.1 C 08/22/2017  8:30 AM  Pulse 77 08/22/2017  8:35 AM  Resp 15 08/22/2017  8:35 AM  SpO2 97 % 08/22/2017  8:35 AM  Vitals shown include unvalidated device data.  Last Pain:  Vitals:   08/22/17 0830  TempSrc: Tympanic  PainSc:          Complications: No apparent anesthesia complications

## 2017-08-22 NOTE — Anesthesia Post-op Follow-up Note (Signed)
Anesthesia QCDR form completed.        

## 2017-08-22 NOTE — Anesthesia Preprocedure Evaluation (Signed)
Anesthesia Evaluation  Patient identified by MRN, date of birth, ID band Patient awake    Reviewed: Allergy & Precautions, NPO status , Patient's Chart, lab work & pertinent test results  History of Anesthesia Complications Negative for: history of anesthetic complications  Airway Mallampati: II       Dental   Pulmonary sleep apnea and Continuous Positive Airway Pressure Ventilation ,           Cardiovascular hypertension, Pt. on medications + Valvular Problems/Murmurs MVP      Neuro/Psych neg Seizures Depression    GI/Hepatic Neg liver ROS, neg GERD  ,  Endo/Other  diabetes, Type 2, Oral Hypoglycemic Agents  Renal/GU Renal InsufficiencyRenal disease     Musculoskeletal   Abdominal   Peds  Hematology   Anesthesia Other Findings   Reproductive/Obstetrics                             Anesthesia Physical Anesthesia Plan  ASA: III  Anesthesia Plan: General   Post-op Pain Management:    Induction:   PONV Risk Score and Plan: 3 and Propofol infusion, TIVA and Treatment may vary due to age or medical condition  Airway Management Planned: Nasal Cannula  Additional Equipment:   Intra-op Plan:   Post-operative Plan:   Informed Consent: I have reviewed the patients History and Physical, chart, labs and discussed the procedure including the risks, benefits and alternatives for the proposed anesthesia with the patient or authorized representative who has indicated his/her understanding and acceptance.     Plan Discussed with:   Anesthesia Plan Comments:         Anesthesia Quick Evaluation

## 2017-08-22 NOTE — Anesthesia Postprocedure Evaluation (Signed)
Anesthesia Post Note  Patient: Kelly [name]  Procedure(s) Performed: COLONOSCOPY WITH PROPOFOL (N/A )  Patient location during evaluation: Endoscopy Anesthesia Type: General Level of consciousness: awake and alert Pain management: pain level controlled Vital Signs Assessment: post-procedure vital signs reviewed and stable Respiratory status: spontaneous breathing and respiratory function stable Cardiovascular status: stable Anesthetic complications: no     Last Vitals:  Vitals:   08/22/17 0736 08/22/17 0830  BP: (!) 160/77 136/68  Pulse: 83 76  Resp: 16 16  Temp: (!) 36.2 C (!) 36.1 C  SpO2: 96% 97%    Last Pain:  Vitals:   08/22/17 0830  TempSrc: Tympanic  PainSc:                  Kandace Elrod K

## 2017-08-22 NOTE — H&P (Signed)
Jonathon Bellows, MD [address], [place], [place], [place], [address] [address], [place], [place], [place], [address] Phone: [phone]  Fax: [phone]  Primary Care Physician:  [name], MD   Pre-Procedure History & Physical: HPI:  Kelly [name] is a 77 y.o. female is here for an colonoscopy.   Past Medical History:  Diagnosis Date  . Colon polyps   . Depression   . DM (diabetes mellitus), type 2 with renal complications ([place])   . Hyperlipidemia   . Hypertension   . Kidney stones   . Sleep apnea   . 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     Past Surgical History:  Procedure Laterality Date  . APPENDECTOMY    . cataract  03/2017  . CHOLECYSTECTOMY    . COLONOSCOPY    . ESOPHAGOGASTRODUODENOSCOPY    . ESOPHAGOGASTRODUODENOSCOPY (EGD) WITH PROPOFOL N/A 03/23/2016   Procedure: ESOPHAGOGASTRODUODENOSCOPY (EGD) WITH PROPOFOL;  Surgeon: Manya Silvas, MD;  Location: [facility] ENDOSCOPY;  Service: Endoscopy;  Laterality: N/A;  . RECTAL PROLAPSE REPAIR  2012   x2    Prior to Admission medications   Medication Sig Start Date End Date Taking? Authorizing Provider  ALPRAZolam Duanne Moron) 0.5 MG tablet Take 1 tablet (0.5 mg total) by mouth 2 (two) times daily as needed for anxiety. 01/17/17   Coral Spikes, DO  amLODipine (NORVASC) 2.5 MG tablet Take 1 tablet (2.5 mg total) by mouth daily. 08/10/17   [name], MD  aspirin EC 81 MG tablet Take 81 mg by mouth daily.    [provider]  atorvastatin (LIPITOR) 10 MG tablet TAKE 1 TABLET DAILY 02/20/17   [name], MD  calcium citrate-vitamin D (CITRACAL+D) 315-200 MG-UNIT tablet Take 1 tablet by mouth 2 (two) times daily.    [provider]  cyclobenzaprine (FLEXERIL) 10 MG tablet Take 1 tablet (10 mg total) by mouth 3 (three) times daily as needed for muscle spasms. 09/20/16   Coral Spikes, DO  fluorouracil (EFUDEX) 5 % cream fluorouracil 5 % topical  cream    [provider]  furosemide (LASIX) 40 MG tablet Take 1 tablet (40 mg total) by mouth daily as needed. 01/11/17   Coral Spikes, DO  gabapentin (NEURONTIN) 300 MG capsule Take 1 capsule (300 mg total) by mouth at bedtime. 07/18/17   [name], MD  glucose blood (IGLUCOSE TEST STRIPS) test strip Use as instructed 11/04/16   Coral Spikes, DO  Insulin Glargine (BASAGLAR KWIKPEN) 100 UNIT/ML SOPN Inject 0.1 mLs (10 Units total) into the skin at bedtime. Patient taking differently: Inject 10 Units into the skin at bedtime.  04/03/17   Crecencio Mc, MD  Insulin Pen Needle (PEN NEEDLES) 32G X 4 MM MISC 100 each by Does not apply route daily. 01/02/17   [name], MD  Lancets MISC Use up to 4 times daily to check blood sugars. 11/04/16   Coral Spikes, DO  losartan (COZAAR) 50 MG tablet Take 50 mg by mouth daily. 07/17/17   [provider]  PARoxetine (PAXIL-CR) 25 MG 24 hr tablet Take 1 tablet (25 mg total) by mouth daily. 08/05/17   [name], MD  potassium chloride (K-DUR) 10 MEQ tablet  07/17/17   [provider]  trimethoprim-polymyxin b (POLYTRIM) ophthalmic solution  07/29/17   [provider]  VICTOZA 18 MG/3ML SOPN INJECT 0.2ML (=1.2MG )  SUBCUTANEOUSLY DAILY 07/20/17   [name], MD    Allergies as of 08/15/2017 - Review Complete 08/15/2017  Allergen Reaction Noted  . Prilosec otc [omeprazole magnesium] Other (See Comments) 07/20/2016  . Sucralfate Other (See Comments) 07/20/2016  . Amoxicillin Diarrhea 06/21/2015  . Morphine and related Other (See Comments) 06/21/2015    Family History  Problem Relation Age of Onset  . Stroke Mother   . Arthritis Mother   . Stroke Sister   . Stroke Sister   . Lung cancer Sister   . Heart attack Father 29    Social History   Socioeconomic History  . Marital status: Married    Spouse name: Not on file  . Number of children: Not on file  . Years of education: Not on  file  . Highest education level: Not on file  Occupational History  . Not on file  Social Needs  . Financial resource strain: Not on file  . Food insecurity:    Worry: Not on file    Inability: Not on file  . Transportation needs:    Medical: Not on file    Non-medical: Not on file  Tobacco Use  . Smoking status: Never Smoker  . Smokeless tobacco: Never Used  Substance and Sexual Activity  . Alcohol use: No  . Drug use: No  . Sexual activity: Not Currently    Partners: Male  Lifestyle  . Physical activity:    Days per week: Not on file    Minutes per session: Not on file  . Stress: Not on file  Relationships  . Social connections:    Talks on phone: Not on file    Gets together: Not on file    Attends religious service: Not on file    Active member of club or organization: Not on file    Attends meetings of clubs or organizations: Not on file    Relationship status: Not on file  . Intimate partner violence:    Fear of current or ex partner: Not on file    Emotionally abused: Not on file    Physically abused: Not on file    Forced sexual activity: Not on file  Other Topics Concern  . Not on file  Social History Narrative  . Not on file    Review of Systems: See HPI, otherwise negative ROS  Physical Exam: BP (!) 160/77   Pulse 83   Temp (!) 97.1 F (36.2 C) (Tympanic)   Resp 16   Ht 5\' 4"  (1.626 m)   Wt 140 lb (63.5 kg)   SpO2 96%   BMI 24.03 kg/m  General:   Alert,  pleasant and cooperative in NAD Head:  Normocephalic and atraumatic. Neck:  Supple; no masses or thyromegaly. Lungs:  Clear throughout to auscultation, normal respiratory effort.    Heart:  +S1, +S2, Regular rate and rhythm, No edema. Abdomen:  Soft, nontender and nondistended. Normal bowel sounds, without guarding, and without rebound.   Neurologic:  Alert and  oriented x4;  grossly normal neurologically.  Impression/Plan: Kelly [name] is here for an colonoscopy to be performed  for rectal bleeding . Risks, benefits, limitations, and alternatives regarding  colonoscopy have been reviewed with the patient.  Questions have been answered.  All parties agreeable.   Jonathon Bellows, MD  08/22/2017, 7:58 AM

## 2017-08-23 ENCOUNTER — Ambulatory Visit
Admission: RE | Admit: 2017-08-23 | Discharge: 2017-08-23 | Disposition: A | Discharge status: Home / Self Care | Payer: Medicare Other | Source: Ambulatory Visit | Attending: Family Medicine | Admitting: Family Medicine

## 2017-08-23 DIAGNOSIS — M810 Age-related osteoporosis without current pathological fracture: Secondary | ICD-10-CM | POA: Diagnosis not present

## 2017-08-23 DIAGNOSIS — Z780 Asymptomatic menopausal state: Secondary | ICD-10-CM

## 2017-08-23 DIAGNOSIS — Z1231 Encounter for screening mammogram for malignant neoplasm of breast: Secondary | ICD-10-CM | POA: Insufficient documentation

## 2017-08-23 DIAGNOSIS — Z1239 Encounter for other screening for malignant neoplasm of breast: Secondary | ICD-10-CM

## 2017-08-23 DIAGNOSIS — M85832 Other specified disorders of bone density and structure, left forearm: Secondary | ICD-10-CM | POA: Diagnosis not present

## 2017-08-23 LAB — SURGICAL PATHOLOGY

## 2017-08-24 ENCOUNTER — Telehealth: Payer: Self-pay

## 2017-08-24 NOTE — Telephone Encounter (Signed)
Advised patient of pathology per Dr. Georgeann Oppenheim note...  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 There was a larger lesion in the rectum close to the anus which I was concerned about neoplasm . The biopsies are negative for cancer, I still want to be sure we are not missing anything as the appearance was abnormal . Advise patient I recommend EUS of rectum to further evaluate. If she wishes to speak in person can have her see me in office.  After reviewing the information with Kelly [name] she requests that we send the referral for EUS in without an office visit to Dr. Vicente Males.   Sent referral to [employer].

## 2017-08-25 LAB — PATHOLOGY

## 2017-08-29 ENCOUNTER — Telehealth: Payer: Self-pay | Admitting: Gastroenterology

## 2017-08-29 NOTE — Telephone Encounter (Signed)
Pt ios calling stating Dr. Vicente Males did a biopsy on pt and told pt to remove a spot she was wondering when and who will set that appointment up for the removal please call pt

## 2017-08-30 ENCOUNTER — Telehealth: Payer: Self-pay

## 2017-08-30 NOTE — Telephone Encounter (Signed)
Advised patient to expect a phone call from Mariea Clonts for scheduling of EUS for rectal lesion evaluation.

## 2017-08-30 NOTE — Telephone Encounter (Signed)
Called and spoke with daughter of Ms. Seeberger. Referral received for lower EUS to evaluate rectal mass. They voiced that Dr. Vicente Males said this would likely need to be removed. Case sent to Dr. Mont Dutton and Dr. Francella Solian for review to see if EMR may be an option. If it is, then this case will need to be scheduled at [facility] as EMR is not performed at [facility]. Oncology Nurse Navigator Documentation  Navigator Location: [facility] (08/30/17 1600)   )Navigator Encounter Type: Telephone (08/30/17 1600) Telephone: [place] Call (08/30/17 1600)                       Barriers/Navigation Needs: Coordination of Care (08/30/17 1600)   Interventions: Coordination of Care (08/30/17 1600)   Coordination of Care: EUS (08/30/17 1600)                  Time Spent with Patient: 15 (08/30/17 1600)

## 2017-08-31 ENCOUNTER — Telehealth: Payer: Self-pay

## 2017-08-31 NOTE — Telephone Encounter (Signed)
Dr. Mont Dutton recommends having this EUS performed at [facility] given the possibility of EMR. Kelly [name] is agreeable to this plan. Referral has been forwarded to [facility]. They will contact Kelly [name] with date/time/instructions. Oncology Nurse Navigator Documentation  Navigator Location: [facility] (08/31/17 1000)   )Navigator Encounter Type: Telephone (08/31/17 1000) Telephone: [place] Call (08/31/17 1000)                               Coordination of Care: EUS (08/31/17 1000)                  Time Spent with Patient: 15 (08/31/17 1000)

## 2017-09-05 ENCOUNTER — Telehealth: Payer: Self-pay

## 2017-09-05 NOTE — Telephone Encounter (Signed)
EUS is confirmed as follows: Clinic appt with PA Roseanne Reno on Thursday, May 2, at 9:30 am and lower EUS with possible EMR with Dr. Mont Dutton on Monday, 5/13, at 2:30 pm. Results will be available in care everywhere for your follow up.  Oncology Nurse Navigator Documentation  Navigator Location: [facility] (09/05/17 0800)   )                                  Coordination of Care: EUS (09/05/17 0800)                  Time Spent with Patient: 15 (09/05/17 0800)

## 2017-09-06 ENCOUNTER — Ambulatory Visit (INDEPENDENT_AMBULATORY_CARE_PROVIDER_SITE_OTHER): Payer: Medicare Other | Admitting: *Deleted

## 2017-09-06 ENCOUNTER — Encounter: Payer: Self-pay | Admitting: *Deleted

## 2017-09-06 VITALS — BP 118/64 | HR 80 | Resp 18

## 2017-09-06 DIAGNOSIS — I10 Essential (primary) hypertension: Secondary | ICD-10-CM | POA: Diagnosis not present

## 2017-09-06 NOTE — Progress Notes (Signed)
Patient in for BP check after adding amlodipine X one month ago at nurse visit.  BP left arm 110/64 pulse 78,. Advised patient to continue medication as advised by PCP until she receives follow up call form nurse.  Patient had additional questions. 1. Patient taking losartan, has stage 4 kidney disease. Patient daughter read on Web MD that losartan can cause kidney damage. Advised patient that losartan can reduce kidney function but that is why we monitor her kidney function and that sometimes medications are given because the benefit out weighs the risk.  2. Patient came in with 2 bottles of OTC calcium one 625 mg the other 1200 mg, patient ask how much to take. Reviewed PCP last note in labs VIt D result stated patient to receive calcium with high calcium diet and not with supplements, patient aware. 3. Patient wanted to advise PCP she was having surgery next month for mass located low in the colon near rectum at [facility] .

## 2017-09-07 DIAGNOSIS — K629 Disease of anus and rectum, unspecified: Secondary | ICD-10-CM | POA: Insufficient documentation

## 2017-09-07 DIAGNOSIS — K6289 Other specified diseases of anus and rectum: Secondary | ICD-10-CM | POA: Diagnosis not present

## 2017-09-07 NOTE — Progress Notes (Signed)
Patient notified and voiced understanding.

## 2017-09-18 DIAGNOSIS — I129 Hypertensive chronic kidney disease with stage 1 through stage 4 chronic kidney disease, or unspecified chronic kidney disease: Secondary | ICD-10-CM | POA: Diagnosis not present

## 2017-09-18 DIAGNOSIS — K633 Ulcer of intestine: Secondary | ICD-10-CM | POA: Diagnosis not present

## 2017-09-18 DIAGNOSIS — K621 Rectal polyp: Secondary | ICD-10-CM | POA: Diagnosis not present

## 2017-09-18 DIAGNOSIS — Z7982 Long term (current) use of aspirin: Secondary | ICD-10-CM | POA: Diagnosis not present

## 2017-09-18 DIAGNOSIS — E785 Hyperlipidemia, unspecified: Secondary | ICD-10-CM | POA: Diagnosis not present

## 2017-09-18 DIAGNOSIS — E1122 Type 2 diabetes mellitus with diabetic chronic kidney disease: Secondary | ICD-10-CM | POA: Diagnosis not present

## 2017-09-18 DIAGNOSIS — K623 Rectal prolapse: Secondary | ICD-10-CM | POA: Diagnosis not present

## 2017-09-18 DIAGNOSIS — Z794 Long term (current) use of insulin: Secondary | ICD-10-CM | POA: Diagnosis not present

## 2017-09-18 DIAGNOSIS — R933 Abnormal findings on diagnostic imaging of other parts of digestive tract: Secondary | ICD-10-CM | POA: Diagnosis not present

## 2017-09-18 DIAGNOSIS — K6289 Other specified diseases of anus and rectum: Secondary | ICD-10-CM | POA: Diagnosis not present

## 2017-09-18 DIAGNOSIS — Z79891 Long term (current) use of opiate analgesic: Secondary | ICD-10-CM | POA: Diagnosis not present

## 2017-09-18 DIAGNOSIS — Z79899 Other long term (current) drug therapy: Secondary | ICD-10-CM | POA: Diagnosis not present

## 2017-09-18 DIAGNOSIS — N184 Chronic kidney disease, stage 4 (severe): Secondary | ICD-10-CM | POA: Diagnosis not present

## 2017-09-20 ENCOUNTER — Encounter: Payer: Self-pay | Admitting: Internal Medicine

## 2017-09-21 DIAGNOSIS — E1165 Type 2 diabetes mellitus with hyperglycemia: Secondary | ICD-10-CM | POA: Diagnosis not present

## 2017-09-21 DIAGNOSIS — R52 Pain, unspecified: Secondary | ICD-10-CM | POA: Diagnosis not present

## 2017-09-21 DIAGNOSIS — M6281 Muscle weakness (generalized): Secondary | ICD-10-CM | POA: Diagnosis not present

## 2017-09-21 DIAGNOSIS — M25519 Pain in unspecified shoulder: Secondary | ICD-10-CM | POA: Diagnosis not present

## 2017-09-21 DIAGNOSIS — M47812 Spondylosis without myelopathy or radiculopathy, cervical region: Secondary | ICD-10-CM | POA: Diagnosis not present

## 2017-09-21 DIAGNOSIS — M542 Cervicalgia: Secondary | ICD-10-CM | POA: Diagnosis not present

## 2017-09-21 DIAGNOSIS — T2101XA Burn of unspecified degree of chest wall, initial encounter: Secondary | ICD-10-CM | POA: Diagnosis not present

## 2017-09-21 DIAGNOSIS — M25572 Pain in left ankle and joints of left foot: Secondary | ICD-10-CM | POA: Diagnosis not present

## 2017-09-21 DIAGNOSIS — M7062 Trochanteric bursitis, left hip: Secondary | ICD-10-CM | POA: Diagnosis not present

## 2017-09-21 DIAGNOSIS — M5136 Other intervertebral disc degeneration, lumbar region: Secondary | ICD-10-CM | POA: Diagnosis not present

## 2017-09-21 DIAGNOSIS — M5032 Other cervical disc degeneration, mid-cervical region, unspecified level: Secondary | ICD-10-CM | POA: Diagnosis not present

## 2017-09-21 DIAGNOSIS — M545 Low back pain: Secondary | ICD-10-CM | POA: Diagnosis not present

## 2017-09-25 DIAGNOSIS — H35372 Puckering of macula, left eye: Secondary | ICD-10-CM | POA: Diagnosis not present

## 2017-09-25 DIAGNOSIS — E119 Type 2 diabetes mellitus without complications: Secondary | ICD-10-CM | POA: Diagnosis not present

## 2017-09-25 LAB — HM DIABETES EYE EXAM

## 2017-10-03 ENCOUNTER — Other Ambulatory Visit: Payer: Self-pay | Admitting: Family Medicine

## 2017-10-09 ENCOUNTER — Telehealth: Payer: Self-pay | Admitting: Gastroenterology

## 2017-10-09 NOTE — Telephone Encounter (Signed)
Pt left vm she states she had a colonoscopy with Dr. Vicente Males and found a mass she then was referred to [facility] to have a U/S colonoscopy and she did that and has not heard anything yet

## 2017-10-11 ENCOUNTER — Other Ambulatory Visit: Payer: Self-pay

## 2017-10-11 DIAGNOSIS — K621 Rectal polyp: Secondary | ICD-10-CM

## 2017-10-12 ENCOUNTER — Encounter: Payer: Self-pay | Admitting: Family Medicine

## 2017-10-25 ENCOUNTER — Ambulatory Visit (INDEPENDENT_AMBULATORY_CARE_PROVIDER_SITE_OTHER): Payer: Medicare Other

## 2017-10-25 ENCOUNTER — Encounter: Payer: Self-pay | Admitting: Family Medicine

## 2017-10-25 ENCOUNTER — Ambulatory Visit (INDEPENDENT_AMBULATORY_CARE_PROVIDER_SITE_OTHER): Payer: Medicare Other | Admitting: Family Medicine

## 2017-10-25 VITALS — BP 150/88 | HR 77 | Temp 97.8°F | Resp 18 | Ht 64.0 in | Wt 143.0 lb

## 2017-10-25 VITALS — BP 150/88 | HR 77 | Temp 97.8°F | Ht 64.0 in | Wt 143.4 lb

## 2017-10-25 DIAGNOSIS — Z794 Long term (current) use of insulin: Secondary | ICD-10-CM

## 2017-10-25 DIAGNOSIS — I10 Essential (primary) hypertension: Secondary | ICD-10-CM

## 2017-10-25 DIAGNOSIS — R200 Anesthesia of skin: Secondary | ICD-10-CM | POA: Diagnosis not present

## 2017-10-25 DIAGNOSIS — E785 Hyperlipidemia, unspecified: Secondary | ICD-10-CM | POA: Diagnosis not present

## 2017-10-25 DIAGNOSIS — Z0000 Encounter for general adult medical examination without abnormal findings: Secondary | ICD-10-CM | POA: Diagnosis not present

## 2017-10-25 DIAGNOSIS — E1122 Type 2 diabetes mellitus with diabetic chronic kidney disease: Secondary | ICD-10-CM | POA: Diagnosis not present

## 2017-10-25 DIAGNOSIS — K629 Disease of anus and rectum, unspecified: Secondary | ICD-10-CM

## 2017-10-25 DIAGNOSIS — R202 Paresthesia of skin: Secondary | ICD-10-CM | POA: Insufficient documentation

## 2017-10-25 DIAGNOSIS — N184 Chronic kidney disease, stage 4 (severe): Secondary | ICD-10-CM | POA: Diagnosis not present

## 2017-10-25 MED ORDER — INSULIN PEN NEEDLE 32G X 4 MM MISC
2 refills | Status: DC
Start: 2017-10-25 — End: 2017-11-06

## 2017-10-25 MED ORDER — INSULIN PEN NEEDLE 32G X 4 MM MISC: 2 refills | Status: DC

## 2017-10-25 MED ORDER — ALPRAZOLAM 0.5 MG PO TABS: 0.2500 mg | ORAL_TABLET | Freq: Two times a day (BID) | ORAL | 0 refills | Status: DC | PRN

## 2017-10-25 NOTE — Assessment & Plan Note (Signed)
Very focal numbness and tingling in the situation of opening closing a door with a doorknob.  I suspect nerve impingement somewhere in her thumb given how focal this is.  Discussed monitoring given that it is so focal and intermittent.  If it becomes more widespread or persistent she should let us know.

## 2017-10-25 NOTE — Progress Notes (Addendum)
Tommi Rumps, MD Phone: [phone]  Kelly [name] is a 77 y.o. female who presents today for f/u.  CC: dm, htn, rectal lesion  DIABETES Disease Monitoring: Blood Sugar ranges-130-481 Polyuria/phagia/dipsia- no      Optho- UTD Medications: Compliance- taking basaglar 10 u daily, victoza Hypoglycemic symptoms- no  HYPERTENSION  Disease Monitoring  Home BP Monitoring not checking Chest pain- no    Dyspnea- no Medications  Compliance-  Taking amlodipine, lasix, losartan.  Edema-rare after eating salty or fried foods.  No orthopnea or PND.  Patient previously saw GI for rectal bleeding.  Found to have rectal lesion.  Referred to [facility] for further evaluation and it was found to be benign.  She is set up for sigmoidoscopy and follow-up with Dr. Vicente Males in [place].  She wonders if this needs to be evaluated sooner.  Patient notes intermittently over the last number of weeks she has felt one side of her left thumb has become numb and tingly mostly when she grabs a doorknob area no injury.  No other numbness or tingling elsewhere.   Social History   Tobacco Use  Smoking Status Never Smoker  Smokeless Tobacco Never Used     ROS see history of present illness  Objective  Physical Exam Vitals:   10/25/17 1035 10/25/17 1103  BP: (!) 160/80 (!) 150/88  Pulse: 77   Temp: 97.8 F (36.6 C)   SpO2: 98%     BP Readings from Last 3 Encounters:  10/25/17 (!) 150/88  10/25/17 (!) 150/88  09/06/17 118/64   Wt Readings from Last 3 Encounters:  10/25/17 143 lb (64.9 kg)  10/25/17 143 lb 6.4 oz (65 kg)  08/22/17 140 lb (63.5 kg)    Physical Exam  Constitutional: No distress.  Cardiovascular: Normal rate, regular rhythm and normal heart sounds.  Pulmonary/Chest: Effort normal and breath sounds normal.  Musculoskeletal: She exhibits no edema.  Neurological: She is alert.  Left hand with intact sensation, no palpable abnormalities of the left thumb, thumb appears warm  and well-perfused, no tenderness of the left thumb  Skin: Skin is warm and dry. She is not diaphoretic.     Assessment/Plan: Please see individual problem list.  Hypertension Currently elevated though had been well controlled.  She will have her blood pressure checked next week and if still elevated consider increasing medication.  Rectal lesion Needs to complete follow-up with GI.  We will send a message to Dr. Vicente Males to see if he thinks this needs to be completed sooner.  DM (diabetes mellitus), type 2 with renal complications ([facility]) Continue current regimen. Not due for A1c. Return next week for labs.   Numbness and tingling of left thumb Very focal numbness and tingling in the situation of opening closing a door with a doorknob.  I suspect nerve impingement somewhere in her thumb given how focal this is.  Discussed monitoring given that it is so focal and intermittent.  If it becomes more widespread or persistent she should let us know.   Orders Placed This Encounter  Procedures  . Comp Met (CMET)    Standing Status:   Future    Standing Expiration Date:   10/26/2018  . Hemoglobin A1c    Standing Status:   Future    Standing Expiration Date:   10/26/2018    Meds ordered this encounter  Medications  . Insulin Pen Needle (BD PEN NEEDLE NANO U/F) 32G X 4 MM MISC    Sig: Use daily with victoza  Dispense:  100 each    Refill:  2  . Insulin Pen Needle (BD PEN NEEDLE NANO U/F) 32G X 4 MM MISC    Sig: Use daily with basaglar    Dispense:  100 each    Refill:  2  . ALPRAZolam (XANAX) 0.5 MG tablet    Sig: Take 0.5 tablets (0.25 mg total) by mouth 2 (two) times daily as needed for anxiety.    Dispense:  30 tablet    Refill:  0     Tommi Rumps, MD [place]

## 2017-10-25 NOTE — Assessment & Plan Note (Signed)
Currently elevated though had been well controlled.  She will have her blood pressure checked next week and if still elevated consider increasing medication.

## 2017-10-25 NOTE — Progress Notes (Signed)
Subjective:   Kelly [name] is a 77 y.o. female who presents for an Initial Medicare Annual Wellness Visit.  Review of Systems    No ROS.  Medicare Wellness Visit. Additional risk factors are reflected in the social history.  Cardiac Risk Factors include: advanced age (>82men, >58 women);hypertension;diabetes mellitus     Objective:    Today's Vitals   10/25/17 1150  BP: (!) 150/88  Pulse: 77  Resp: 18  Temp: 97.8 F (36.6 C)  TempSrc: Oral  SpO2: 98%  Weight: 143 lb (64.9 kg)  Height: 5\' 4"  (1.626 m)   Body mass index is 24.55 kg/m.  Advanced Directives 10/25/2017 08/22/2017 05/23/2017 07/08/2016 07/08/2016 07/08/2016 03/23/2016  Does Patient Have a Medical Advance Directive? Yes Yes Yes Yes Yes Yes Yes  Type of Paramedic of [place];Living will - Healthcare Power of [place] of [place];Living will Living will  Does patient want to make changes to medical advance directive? No - Patient declined - - No - Patient declined - - -  Copy of [place] in Chart? No - copy requested - - No - copy requested No - copy requested - No - copy requested  Would patient like information on creating a medical advance directive? - - - No - Patient declined - - -    Current Medications (verified) Outpatient Encounter Medications as of 10/25/2017  Medication Sig  . amLODipine (NORVASC) 2.5 MG tablet Take 1 tablet (2.5 mg total) by mouth daily.  Marland Kitchen aspirin EC 81 MG tablet Take 81 mg by mouth daily.  Marland Kitchen atorvastatin (LIPITOR) 10 MG tablet TAKE 1 TABLET DAILY  . ATORVASTATIN CALCIUM PO Take by mouth.  . calcium citrate-vitamin D (CITRACAL+D) 315-200 MG-UNIT tablet Take 1 tablet by mouth 2 (two) times daily.  . cyclobenzaprine (FLEXERIL) 10 MG tablet Take 1 tablet (10 mg total) by mouth 3 (three) times daily as needed for muscle spasms.  . furosemide (LASIX) 40 MG tablet Take 1  tablet (40 mg total) by mouth daily as needed.  . gabapentin (NEURONTIN) 300 MG capsule Take 1 capsule (300 mg total) by mouth at bedtime.  Marland Kitchen glucose blood (IGLUCOSE TEST STRIPS) test strip Use as instructed  . HYDROcodone-acetaminophen (NORCO) 5-325 MG tablet Norco 5 mg-325 mg tablet  Take 1 tablet twice a day by oral route as needed.  . Insulin Glargine (BASAGLAR KWIKPEN) 100 UNIT/ML SOPN Inject 0.1 mLs (10 Units total) into the skin at bedtime. (Patient taking differently: Inject 10 Units into the skin at bedtime. )  . Lancets MISC Use up to 4 times daily to check blood sugars.  Marland Kitchen losartan (COZAAR) 50 MG tablet Take 50 mg by mouth daily.  Marland Kitchen PARoxetine (PAXIL-CR) 25 MG 24 hr tablet Take 1 tablet (25 mg total) by mouth daily.  Marland Kitchen PAROXETINE HCL PO Take by mouth.  . potassium chloride (K-DUR) 10 MEQ tablet potassium chloride ER 10 mEq tablet,extended release  . VICTOZA 18 MG/3ML SOPN INJECT 0.2ML (=1.2MG )      SUBCUTANEOUSLY DAILY   No facility-administered encounter medications on file as of 10/25/2017.     Allergies (verified) Prilosec otc [omeprazole magnesium]; Sucralfate; Amoxicillin; and Morphine and related   History: Past Medical History:  Diagnosis Date  . Colon polyps   . Depression   . DM (diabetes mellitus), type 2 with renal complications ([place])   . Hyperlipidemia   . Hypertension   . Kidney stones   .  Sleep apnea   . 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    Past Surgical History:  Procedure Laterality Date  . APPENDECTOMY    . cataract  03/2017  . CHOLECYSTECTOMY    . COLONOSCOPY    . COLONOSCOPY WITH PROPOFOL N/A 08/22/2017   Procedure: COLONOSCOPY WITH PROPOFOL;  Surgeon: Jonathon Bellows, MD;  Location: [facility] ENDOSCOPY;  Service: Gastroenterology;  Laterality: N/A;  . ESOPHAGOGASTRODUODENOSCOPY    . ESOPHAGOGASTRODUODENOSCOPY (EGD) WITH PROPOFOL N/A 03/23/2016   Procedure: ESOPHAGOGASTRODUODENOSCOPY (EGD) WITH PROPOFOL;  Surgeon: Manya Silvas, MD;  Location: [facility] ENDOSCOPY;  Service: Endoscopy;  Laterality: N/A;  . RECTAL PROLAPSE REPAIR  2012   x2   Family History  Problem Relation Age of Onset  . Stroke Mother   . Arthritis Mother   . Aortic aneurysm Sister   . Lung cancer Sister   . Heart attack Father 20  . Breast cancer Sister 40   Social History   Socioeconomic History  . Marital status: Married    Spouse name: Not on file  . Number of children: Not on file  . Years of education: Not on file  . Highest education level: Not on file  Occupational History  . Not on file  Social Needs  . Financial resource strain: Not hard at all  . Food insecurity:    Worry: Never true    Inability: Never true  . Transportation needs:    Medical: No    Non-medical: No  Tobacco Use  . Smoking status: Never Smoker  . Smokeless tobacco: Never Used  Substance and Sexual Activity  . Alcohol use: No  . Drug use: No  . Sexual activity: Not Currently    Partners: Male  Lifestyle  . Physical activity:    Days per week: 0 days    Minutes per session: Not on file  . Stress: Not on file  Relationships  . Social connections:    Talks on phone: Not on file    Gets together: Not on file    Attends religious service: Not on file    Active member of club or organization: Not on file    Attends meetings of clubs or organizations: Not on file    Relationship status: Not on file  Other Topics Concern  . Not on file  Social History Narrative  . Not on file    Tobacco Counseling Counseling given: Not Answered   Clinical Intake:  Pre-visit preparation completed: Yes  Pain : No/denies pain     Nutritional Status: BMI of 19-24  Normal Diabetes: Yes(Followed by pcp)  How often do you need to have someone help you when you read instructions, pamphlets, or other written materials from your doctor or pharmacy?: 1 - Never  Interpreter Needed?: No      Activities of Daily Living In your present state of health,  do you have any difficulty performing the following activities: 10/25/2017  Hearing? Y  Comment Difficulty hearing conversational tones. She plans to follow up with her insurance for hearing aids.  Vision? N  Difficulty concentrating or making decisions? N  Walking or climbing stairs? N  Dressing or bathing? N  Doing errands, shopping? N  Preparing Food and eating ? N  Using the Toilet? N  In the past six months, have you accidently leaked urine? N  Do you have problems with loss of bowel control? N  Managing your Medications? N  Managing your Finances? N  Housekeeping or managing your Housekeeping? N  Some recent data might be hidden     Immunizations and Health Maintenance Immunization History  Administered Date(s) Administered  . Influenza, High Dose Seasonal PF 02/24/2017   There are no preventive care reminders to display for this patient.  Patient Care Team: [name], MD as PCP - General (Family Medicine)  Indicate any recent Medical Services you may have received from other than [facility] providers in the past year (date may be approximate).     Assessment:   This is a routine wellness examination for Tykira.  The goal of the wellness visit is to assist the patient how to close the gaps in care and create a preventative care plan for the patient.   The roster of all physicians providing medical care to patient is listed in the Snapshot section of the chart.  Taking calcium VIT D as appropriate/Osteoporosis risk reviewed.    Safety issues reviewed; Smoke and carbon monoxide detectors in the home. No firearms in the home. Wears seatbelts when driving or riding with others. No violence in the home.  They do not have excessive sun exposure.  Discussed the need for sun protection: hats, long sleeves and the use of sunscreen if there is significant sun exposure.  Patient is alert, normal appearance, oriented to person/place/and time. Correctly identified the  president of the [place] and recalls of 5/5 words.  Performs simple calculations and can read correct time from watch face. Displays appropriate judgement.  No new identified risk were noted.  No failures at ADL's or IADL's.    BMI- discussed the importance of a healthy diet, water intake and the benefits of aerobic exercise. Educational material provided.   24 hour diet recall: Diabetic diet  Dental- every 6 months.  Eye- Visual acuity not assessed per patient preference since they have regular follow up with the ophthalmologist.  Wears corrective lenses.  Sleep -  CPAP in use.  Health maintenance gaps- closed.  Patient Concerns: None at this time. Follow up with PCP as needed.  Hearing/Vision screen Hearing Screening Comments: Difficulty hearing conversational tones.  Past reports dictate she needs hearing aids. She does not currently wear hearing aids.    Vision Screening Comments: Followed by Dr. Ellin Mayhew Annual visits No retinopathy reported Cataract extraction, bilateral Visual acuity not assessed per patient preference since they have regular follow up with the ophthalmologist  Dietary issues and exercise activities discussed: Current Exercise Habits: The patient does not participate in regular exercise at present  Goals    . Increase physical activity     Walk up to 150 minutes per week for exercise (5 days, 30 minutes)      Depression Screen PHQ 2/9 Scores 10/25/2017 08/15/2017 05/22/2017 08/26/2016  PHQ - 2 Score 0 0 0 0    Fall Risk Fall Risk  10/25/2017 08/15/2017 06/26/2017 05/22/2017 08/26/2016  Falls in the past year? No No Yes Yes No  Number falls in past yr: - - - 2 or more -  Injury with Fall? - - No - -  Risk Factor Category  - - - High Fall Risk -  Risk for fall due to : - - - Impaired balance/gait -  Follow up - - - Falls prevention discussed -   Cognitive Function:     6CIT Screen 10/25/2017  What Year? 0 points  What month? 0 points  What time? 0  points  Count back from 20 0 points  Months in reverse  0 points  Repeat phrase 0 points  Total Score 0    Screening Tests Health Maintenance  Topic Date Due  . INFLUENZA VACCINE  12/07/2017  . FOOT EXAM  12/20/2017  . HEMOGLOBIN A1C  02/02/2018  . TETANUS/TDAP  05/09/2018  . OPHTHALMOLOGY EXAM  09/26/2018  . DEXA SCAN  Completed  . PNA vac Low Risk Adult  Completed      Plan:    End of life planning; Advance aging; Advanced directives discussed. Copy of current HCPOA/Living Will requested.    I have personally reviewed and noted the following in the patient's chart:   . Medical and social history . Use of alcohol, tobacco or illicit drugs  . Current medications and supplements . Functional ability and status . Nutritional status . Physical activity . Advanced directives . List of other physicians . Hospitalizations, surgeries, and ER visits in previous 12 months . Vitals . Screenings to include cognitive, depression, and falls . Referrals and appointments  In addition, I have reviewed and discussed with patient certain preventive protocols, quality metrics, and best practice recommendations. A written personalized care plan for preventive services as well as general preventive health recommendations were provided to patient.     Varney Biles, LPN   [id number]

## 2017-10-25 NOTE — Patient Instructions (Addendum)
  Ms. Hofmeister , Thank you for taking time to come for your Medicare Wellness Visit. I appreciate your ongoing commitment to your health goals. Please review the following plan we discussed and let me know if I can assist you in the future.   These are the goals we discussed: Goals    . Increase physical activity     Walk up to 150 minutes per week for exercise (5 days, 30 minutes)       This is a list of the screening recommended for you and due dates:  Health Maintenance  Topic Date Due  . Flu Shot  12/07/2017  . Complete foot exam   12/20/2017  . Hemoglobin A1C  02/02/2018  . Tetanus Vaccine  05/09/2018  . Eye exam for diabetics  09/26/2018  . DEXA scan (bone density measurement)  Completed  . Pneumonia vaccines  Completed

## 2017-10-25 NOTE — Assessment & Plan Note (Signed)
Needs to complete follow-up with GI.  We will send a message to Dr. Vicente Males to see if he thinks this needs to be completed sooner.

## 2017-10-25 NOTE — Patient Instructions (Addendum)
Nice to see you. We will have you return on 11/03/17 or at a later date for lab work. You can discontinue the gabapentin. Please decrease the dose of your Xanax when you do take it to half a tablet.  If this makes you drowsy please do not drive.  Please do not drink alcohol with this.

## 2017-10-25 NOTE — Assessment & Plan Note (Signed)
Continue current regimen. Not due for A1c. Return next week for labs.

## 2017-10-31 ENCOUNTER — Telehealth: Payer: Self-pay | Admitting: Family Medicine

## 2017-10-31 NOTE — Telephone Encounter (Signed)
Please let the patient or her daughter know that I heard back from Dr. Vicente Males regarding the patient's appointment for October.  It looks like they have biopsied the lesion of concern twice and both times the results were benign meaning they were noncancerous.  He thinks follow-up on this in October would be appropriate as it would give time for anything to show up as all the results thus far have been benign.  He did note that if they are concerned he would be happy to see the patient in the office to discuss further.  If they would like to see him in the office we can try to arrange this follow-up.  Thanks.

## 2017-10-31 NOTE — Telephone Encounter (Signed)
-----   Message from Jonathon Bellows, MD sent at 10/29/2017  4:37 PM EDT ----- Good evening   We have so far biopsied the lesion twice and also did an EUS- both times the results were benign- to be sure we plan to repeat sigmoidoscopy in a few months - I think October would be appropriate - if there is anything it would give time to show up as so far all results are benign,. IF she is still concerned , I would be happy to see her in the office or we can schedule it for late September  Do let me know your thoughts  Regards  Kiran  ----- Message ----- From: [name], MD Sent: 10/25/2017   7:19 PM To: Jonathon Bellows, MD  Hi Kiran,  This patient is set up to see you in October for a repeat sigmoidoscopy for a rectal lesion that you found and was evaluated at [facility]. She wanted to know if this should be re-evaluated prior to October. I wanted to check with you and get your thoughts. Thanks for your help.   Randall Hiss

## 2017-11-01 NOTE — Telephone Encounter (Signed)
Left message to return call, ok for pec to speak with patient or daughter about message below

## 2017-11-02 ENCOUNTER — Ambulatory Visit (INDEPENDENT_AMBULATORY_CARE_PROVIDER_SITE_OTHER): Payer: Medicare Other | Admitting: *Deleted

## 2017-11-02 ENCOUNTER — Ambulatory Visit: Payer: Medicare Other

## 2017-11-02 ENCOUNTER — Telehealth: Payer: Self-pay

## 2017-11-02 ENCOUNTER — Other Ambulatory Visit (INDEPENDENT_AMBULATORY_CARE_PROVIDER_SITE_OTHER): Payer: Medicare Other

## 2017-11-02 VITALS — BP 120/60 | HR 86 | Resp 18

## 2017-11-02 DIAGNOSIS — E1122 Type 2 diabetes mellitus with diabetic chronic kidney disease: Secondary | ICD-10-CM

## 2017-11-02 DIAGNOSIS — N184 Chronic kidney disease, stage 4 (severe): Secondary | ICD-10-CM | POA: Diagnosis not present

## 2017-11-02 DIAGNOSIS — R7989 Other specified abnormal findings of blood chemistry: Secondary | ICD-10-CM

## 2017-11-02 DIAGNOSIS — Z794 Long term (current) use of insulin: Secondary | ICD-10-CM

## 2017-11-02 DIAGNOSIS — I10 Essential (primary) hypertension: Secondary | ICD-10-CM

## 2017-11-02 DIAGNOSIS — R945 Abnormal results of liver function studies: Principal | ICD-10-CM

## 2017-11-02 LAB — COMPREHENSIVE METABOLIC PANEL
ALT: 299 U/L — ABNORMAL HIGH (ref 0–35)
AST: 324 U/L — ABNORMAL HIGH (ref 0–37)
Albumin: 3.7 g/dL (ref 3.5–5.2)
Alkaline Phosphatase: 125 U/L — ABNORMAL HIGH (ref 39–117)
BUN: 37 mg/dL — ABNORMAL HIGH (ref 6–23)
CO2: 29 mEq/L (ref 19–32)
Calcium: 8.9 mg/dL (ref 8.4–10.5)
Chloride: 103 mEq/L (ref 96–112)
Creatinine, Ser: 2.3 mg/dL — ABNORMAL HIGH (ref 0.40–1.20)
GFR: 21.89 mL/min — ABNORMAL LOW (ref >60.00)
Glucose, Bld: 408 mg/dL — ABNORMAL HIGH (ref 70–99)
Potassium: 5.1 mEq/L (ref 3.5–5.1)
Sodium: 137 mEq/L (ref 135–145)
Total Bilirubin: 0.7 mg/dL (ref 0.2–1.2)
Total Protein: 6.2 g/dL (ref 6.0–8.3)

## 2017-11-02 LAB — HEMOGLOBIN A1C: Hgb A1c MFr Bld: 9.1 % — ABNORMAL HIGH (ref 4.6–6.5)

## 2017-11-02 NOTE — Telephone Encounter (Signed)
-----   Message from [name], MD sent at 11/02/2017  3:44 PM EDT ----- Please let the patient know that her kidney function is stable.  Her liver function tests are elevated.  I would like to recheck these tomorrow.  Please see if she has been drinking any alcohol or taking excessive amounts of Tylenol.  Her diabetes is worse with an A1c of 9.1.  I would like for her to see Chrys Racer possibly next week if she has available appointments.  Please place an order for a hepatic function panel for elevated LFTs.  Thanks.

## 2017-11-02 NOTE — Progress Notes (Signed)
Patient in for 2 week recheck on BP 120/60 pulse 86 in the left arm , patient rested additional few minutes BP taken in right arm 122/60 pulse 80.

## 2017-11-03 ENCOUNTER — Other Ambulatory Visit (INDEPENDENT_AMBULATORY_CARE_PROVIDER_SITE_OTHER): Payer: Medicare Other

## 2017-11-03 ENCOUNTER — Telehealth: Payer: Self-pay | Admitting: Family Medicine

## 2017-11-03 DIAGNOSIS — R945 Abnormal results of liver function studies: Secondary | ICD-10-CM

## 2017-11-03 DIAGNOSIS — R7989 Other specified abnormal findings of blood chemistry: Secondary | ICD-10-CM

## 2017-11-03 LAB — HEPATIC FUNCTION PANEL
ALT: 200 U/L — ABNORMAL HIGH (ref 0–35)
AST: 115 U/L — ABNORMAL HIGH (ref 0–37)
Albumin: 3.7 g/dL (ref 3.5–5.2)
Alkaline Phosphatase: 140 U/L — ABNORMAL HIGH (ref 39–117)
Bilirubin, Direct: 0.1 mg/dL (ref 0.0–0.3)
Total Bilirubin: 0.5 mg/dL (ref 0.2–1.2)
Total Protein: 6.1 g/dL (ref 6.0–8.3)

## 2017-11-03 NOTE — Telephone Encounter (Signed)
Pt would like a call on her home number regarding lab results. Please and Thank you!

## 2017-11-03 NOTE — Telephone Encounter (Signed)
Please advise 

## 2017-11-06 ENCOUNTER — Ambulatory Visit (INDEPENDENT_AMBULATORY_CARE_PROVIDER_SITE_OTHER): Payer: Medicare Other | Admitting: Pharmacist

## 2017-11-06 ENCOUNTER — Encounter: Payer: Self-pay | Admitting: Pharmacist

## 2017-11-06 VITALS — BP 131/82 | HR 76 | Ht 64.0 in | Wt 143.0 lb

## 2017-11-06 DIAGNOSIS — Z794 Long term (current) use of insulin: Secondary | ICD-10-CM | POA: Diagnosis not present

## 2017-11-06 DIAGNOSIS — N184 Chronic kidney disease, stage 4 (severe): Secondary | ICD-10-CM

## 2017-11-06 DIAGNOSIS — E1122 Type 2 diabetes mellitus with diabetic chronic kidney disease: Secondary | ICD-10-CM | POA: Diagnosis not present

## 2017-11-06 MED ORDER — LIRAGLUTIDE 18 MG/3ML ~~LOC~~ SOPN: 1.8000 mg | PEN_INJECTOR | Freq: Every day | SUBCUTANEOUS | 3 refills | Status: DC

## 2017-11-06 MED ORDER — INSULIN PEN NEEDLE 32G X 4 MM MISC: 2 refills | Status: DC

## 2017-11-06 NOTE — Telephone Encounter (Signed)
Discussed with patient and patient's daughter. They are aware and have f/u scheduled for October.

## 2017-11-06 NOTE — Telephone Encounter (Signed)
Patient comes in today and will review labs

## 2017-11-06 NOTE — Telephone Encounter (Signed)
Patient is coming in to see you today, could you please inform patient of message below.

## 2017-11-06 NOTE — Patient Instructions (Addendum)
It was great to see you today!   Today we will make the following medication changes: Increase Victoza to 1.8 mg daily  Continue Basaglar at 10 units daily. Continue checking your blood sugars first thing in the morning. We have sent in a prescription for the pen needles that instructions to use with Victoza and Basaglar daily.  Plan to follow up in 4 weeks with pharmacist (Catie Fairview).

## 2017-11-06 NOTE — Progress Notes (Signed)
    S:     Chief Complaint  Patient presents with  . Medication Management    Diabetes    Patient arrives in good spirits, presenting with her daughter, ambulating without assistance.  Presents for diabetes evaluation, education, and management at the request of Dr. Caryl Bis (referred on 11/02/2017 - lab note). Last seen by primary care provider on 10/25/17 - at that time no changes were made however A1C resulted at 9.1 and patient was referred to pharmacy clinic.   Patient reports adherence with medications.  Current diabetes medications include: Basaglar 10 units daily, Victoza 1.2 mg daily (had held off on increasing in the past due to concern over weight loss) Current hypertension medications include: losartan 50 mg daily   Subjective report from memory - CBGs - 10 am: 150s-200s in the morning;  - 114: afternoon 4 pm;   Patient denies hypoglycemic events. One feeling of low in the afternoon after being out all day. When checked it was down to 78 mg/dL  Patient reported dietary habits: Eats 2 meals/day Breakfast: Flavored water -> icee drink from [employer]; ICE seltzer water; peanut butter on bread Lunch: 2 hot dogs; french fries; fish sandwiches;  Dinner: Usual eats out or makes a sandwich Snacks: Peanut butter + bread  Drinks: Soda once in a while; mostly ICE drinks  Patient reported exercise habits:    Patient denies nocturia.  Patient denies pain/burning on urination.  Patient denies neuropathy. Patient denies visual changes. Patient reports self foot exams.   O:  Physical Exam  Constitutional: She appears well-developed and well-nourished.     Review of Systems  Constitutional: Negative for weight loss.  Gastrointestinal: Negative for nausea and vomiting.  All other systems reviewed and are negative.    Lab Results  Component Value Date   HGBA1C 9.1 (H) 11/02/2017   Vitals:   11/06/17 1022  BP: 131/82  Pulse: 76    Lipid Panel     Component Value  Date/Time   CHOL 133 08/02/2017 1022   TRIG 131.0 08/02/2017 1022   HDL 30.30 (L) 08/02/2017 1022   CHOLHDL 4 08/02/2017 1022   VLDL 26.2 08/02/2017 1022   LDLCALC 77 08/02/2017 1022    A/P: #Diabetes longstanding currently uncontrolled as evidenced by . Patient denies hypoglycemic events and is able to verbalize appropriate hypoglycemia management plan. Patient reports adherence with medication. Control is suboptimal due to dietary indiscretion. - Increase Victoza to 1.8 mg daily - Continue Basaglar 10 units daily - Next A1C anticipated 02/02/2018 or later.    #ASCVD risk - primary prevention in patient aged 38-75 with DM, baseline LDL 70-189, LDL <100 on moderate intensity statin therapy. - Continued Aspirin 81 mg  - Continued atovastatin 10 mg.   #Hypertension longstanding currently reasonably controlled.  Patient reports adherence with medication. Control is suboptimal due to dietary indiscretion. - No changes to medication  Written patient instructions provided.  Total time in face to face counseling 30 minutes.    Follow up in Pharmacist Clinic Visit 4 weeks.   Patient seen with Courtney Heys, PharmD.   Carlean Jews, Pharm.D., BCPS, CPP PGY2 Ambulatory Care Pharmacy Resident Phone: [phone]

## 2017-11-06 NOTE — Progress Notes (Signed)
Patient notified and voiced understanding.

## 2017-11-07 ENCOUNTER — Other Ambulatory Visit: Payer: Self-pay | Admitting: Family Medicine

## 2017-11-07 DIAGNOSIS — R945 Abnormal results of liver function studies: Principal | ICD-10-CM

## 2017-11-07 DIAGNOSIS — R7989 Other specified abnormal findings of blood chemistry: Secondary | ICD-10-CM

## 2017-11-07 NOTE — Assessment & Plan Note (Signed)
#  ASCVD risk - primary prevention in patient aged 76-75 with DM, baseline LDL 70-189, LDL <100 on moderate intensity statin therapy. - Continued Aspirin 81 mg  - Continued atovastatin 10 mg.   #Hypertension longstanding currently reasonably controlled.  Patient reports adherence with medication. Control is suboptimal due to dietary indiscretion. - No changes to medication

## 2017-11-07 NOTE — Progress Notes (Signed)
I have reviewed the above note and agree. I was available to the pharmacist for consultation.  Tommi Rumps, MD

## 2017-11-07 NOTE — Assessment & Plan Note (Signed)
#  Diabetes longstanding currently uncontrolled as evidenced by . Patient denies hypoglycemic events and is able to verbalize appropriate hypoglycemia management plan. Patient reports adherence with medication. Control is suboptimal due to dietary indiscretion. - Increase Victoza to 1.8 mg daily - Continue Basaglar 10 units daily - Next A1C anticipated 02/02/2018 or later.

## 2017-11-13 DIAGNOSIS — N184 Chronic kidney disease, stage 4 (severe): Secondary | ICD-10-CM | POA: Diagnosis not present

## 2017-11-13 DIAGNOSIS — E1129 Type 2 diabetes mellitus with other diabetic kidney complication: Secondary | ICD-10-CM | POA: Diagnosis not present

## 2017-11-13 DIAGNOSIS — I10 Essential (primary) hypertension: Secondary | ICD-10-CM | POA: Diagnosis not present

## 2017-11-14 ENCOUNTER — Ambulatory Visit
Admission: RE | Admit: 2017-11-14 | Discharge: 2017-11-14 | Disposition: A | Discharge status: Home / Self Care | Payer: Medicare Other | Source: Ambulatory Visit | Attending: Family Medicine | Admitting: Family Medicine

## 2017-11-14 DIAGNOSIS — R7989 Other specified abnormal findings of blood chemistry: Secondary | ICD-10-CM | POA: Diagnosis not present

## 2017-11-14 DIAGNOSIS — R945 Abnormal results of liver function studies: Secondary | ICD-10-CM | POA: Diagnosis not present

## 2017-11-14 DIAGNOSIS — Z9049 Acquired absence of other specified parts of digestive tract: Secondary | ICD-10-CM | POA: Diagnosis not present

## 2017-11-15 ENCOUNTER — Telehealth: Payer: Self-pay | Admitting: Gastroenterology

## 2017-11-15 DIAGNOSIS — N184 Chronic kidney disease, stage 4 (severe): Secondary | ICD-10-CM | POA: Diagnosis not present

## 2017-11-15 DIAGNOSIS — E1129 Type 2 diabetes mellitus with other diabetic kidney complication: Secondary | ICD-10-CM | POA: Diagnosis not present

## 2017-11-15 DIAGNOSIS — I10 Essential (primary) hypertension: Secondary | ICD-10-CM | POA: Diagnosis not present

## 2017-11-15 NOTE — Telephone Encounter (Signed)
Patients daughter Awilda Bill) said her mother has an appt on 08/20 to discuss mass but since her liver enzymes are elevated she didn't know if this appt should be moved up.  [facility] does not want to remove the mass because its benign.

## 2017-11-15 NOTE — Telephone Encounter (Signed)
Pt daughterr is calling to Let Dr. Vicente Males know her mother  Liver Enzymes are elevated. pt had sonogram. She also has a Mass was benime. Please call pt

## 2017-11-16 NOTE — Telephone Encounter (Signed)
Thanks I will ask front office to schedule her for a sooner appt

## 2017-11-16 NOTE — Telephone Encounter (Signed)
Yes lets see her sooner

## 2017-11-20 ENCOUNTER — Other Ambulatory Visit
Admission: RE | Admit: 2017-11-20 | Discharge: 2017-11-20 | Disposition: A | Discharge status: Home / Self Care | Payer: Medicare Other | Source: Ambulatory Visit | Attending: Gastroenterology | Admitting: Gastroenterology

## 2017-11-20 ENCOUNTER — Ambulatory Visit (INDEPENDENT_AMBULATORY_CARE_PROVIDER_SITE_OTHER): Payer: Medicare Other | Admitting: Gastroenterology

## 2017-11-20 DIAGNOSIS — R7989 Other specified abnormal findings of blood chemistry: Secondary | ICD-10-CM

## 2017-11-20 DIAGNOSIS — R945 Abnormal results of liver function studies: Secondary | ICD-10-CM | POA: Insufficient documentation

## 2017-11-20 DIAGNOSIS — K626 Ulcer of anus and rectum: Secondary | ICD-10-CM | POA: Diagnosis not present

## 2017-11-20 LAB — HEPATIC FUNCTION PANEL
ALT: 19 U/L (ref 0–44)
AST: 19 U/L (ref 15–41)
Albumin: 3.8 g/dL (ref 3.5–5.0)
Alkaline Phosphatase: 106 U/L (ref 38–126)
Bilirubin, Direct: <0.1 mg/dL (ref 0.0–0.2)
Total Bilirubin: 0.8 mg/dL (ref 0.3–1.2)
Total Protein: 6.5 g/dL (ref 6.5–8.1)

## 2017-11-20 NOTE — Progress Notes (Signed)
 Kiran Anna MD, MRCP(U.K) [address]  [address]  [place], [place] [address]  Main: [phone]  Fax: [phone]   Primary Care Physician: Sonnenberg, Eric G, MD  Primary Gastroenterologist:  Dr. Kiran Anna   No chief complaint on file.   HPI: Kelly [name] is a 77 y.o. female   Summary of history :  She is here to follow up for rectal bleeding and a rectal mass . She was initially referred and seen on 08/15/17 for rectal bleeding ongoing for several years.   Interval history   08/15/2017-  11/20/2017  08/22/17- Colonoscopy  - 10 mm tubular adenoma excised in the ascending colon and a mass like lesion seen in the distal rectum close to the anus and bx showed no malignancy , was benign . 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 In the interim referred back for elevated LFT's  Hepatic Function Latest Ref Rng & Units 11/03/2017 11/02/2017 03/22/2017  Total Protein 6.0 - 8.3 g/dL 6.1 6.2 7.3  Albumin 3.5 - 5.2 g/dL 3.7 3.7 4.3  AST 0 - 37 U/L 115(H) 324(H) 19  ALT 0 - 35 U/L 200(H) 299(H) 13  Alk Phosphatase 39 - 117 U/L 140(H) 125(H) 108  Total Bilirubin 0.2 - 1.2 mg/dL 0.5 0.7 0.8  Bilirubin, Direct 0.0 - 0.3 mg/dL 0.1 - -     RUQ USG shows CBD 7.4 mm and minimally lobulated contour of the liver , s/p cholecystectomy.   No new medications but Victoza dose was increased, denies being sick around that . Does not take any tylenol. Think she had some muscle cramps around that time.    Current Outpatient Medications  Medication Sig Dispense Refill  . ALPRAZolam (XANAX) 0.5 MG tablet Take 0.5 tablets (0.25 mg total) by mouth 2 (two) times daily as needed for anxiety. 30 tablet 0  . amLODipine (NORVASC)  2.5 MG tablet Take 1 tablet (2.5 mg total) by mouth daily. 90 tablet 3  . aspirin EC 81 MG tablet Take 81 mg by mouth daily.    . atorvastatin (LIPITOR) 10 MG tablet TAKE 1 TABLET DAILY 90 tablet 1  . calcium citrate-vitamin D (CITRACAL+D) 315-200 MG-UNIT tablet Take 1 tablet by mouth 2 (two) times daily.    . cyclobenzaprine (FLEXERIL) 10 MG tablet Take 1 tablet (10 mg total) by mouth 3 (three) times daily as needed for muscle spasms. (Patient not taking: Reported on 11/06/2017) 90 tablet 0  . furosemide (LASIX) 40 MG tablet Take 1 tablet (40 mg total) by mouth daily as needed. 90 tablet 0  . gabapentin (NEURONTIN) 300 MG capsule Take 1 capsule (300 mg total) by mouth at bedtime. (Patient not taking: Reported on 11/06/2017) 90 capsule 0  . glucose blood (IGLUCOSE TEST STRIPS) test strip Use as instructed 100 each 12  . HYDROcodone-acetaminophen (NORCO) 5-325 MG tablet Norco 5 mg-325 mg tablet  Take 1 tablet twice a day by oral route as needed.    . Insulin Glargine (BASAGLAR KWIKPEN) 100 UNIT/ML SOPN Inject 0.1 mLs (10 Units total) into the skin at bedtime. (Patient taking differently: Inject 10 Units into the skin at bedtime. ) 1 pen 3  . Insulin Pen Needle (BD PEN NEEDLE NANO U/F) 32G X 4 MM   MISC Use daily with victoza and basaglar 200 each 2  . Lancets MISC Use up to 4 times daily to check blood sugars. 200 each 11  . liraglutide (VICTOZA) 18 MG/3ML SOPN Inject 0.3 mLs (1.8 mg total) into the skin daily. 27 mL 3  . losartan (COZAAR) 50 MG tablet Take 50 mg by mouth daily.    Marland Kitchen PARoxetine (PAXIL-CR) 25 MG 24 hr tablet Take 1 tablet (25 mg total) by mouth daily. 90 tablet 3  . potassium chloride (K-DUR) 10 MEQ tablet potassium chloride ER 10 mEq tablet,extended release     No current facility-administered medications for this visit.     Allergies as of 11/20/2017 - Review Complete 11/06/2017  Allergen Reaction Noted  . Prilosec otc [omeprazole magnesium] Other (See Comments) 07/20/2016  .  Sucralfate Other (See Comments) 07/20/2016  . Amoxicillin Diarrhea 06/21/2015  . Morphine and related Other (See Comments) 06/21/2015    ROS:  General: Negative for anorexia, weight loss, fever, chills, fatigue, weakness. ENT: Negative for hoarseness, difficulty swallowing , nasal congestion. CV: Negative for chest pain, angina, palpitations, dyspnea on exertion, peripheral edema.  Respiratory: Negative for dyspnea at rest, dyspnea on exertion, cough, sputum, wheezing.  GI: See history of present illness. GU:  Negative for dysuria, hematuria, urinary incontinence, urinary frequency, nocturnal urination.  Endo: Negative for unusual weight change.    Physical Examination:   There were no vitals taken for this visit.  General: Well-nourished, well-developed in no acute distress.  Eyes: No icterus. Conjunctivae pink. Mouth: Oropharyngeal mucosa moist and pink , no lesions erythema or exudate. Lungs: Clear to auscultation bilaterally. Non-labored. Heart: Regular rate and rhythm, no murmurs rubs or gallops.  Abdomen: Bowel sounds are normal, nontender, nondistended, no hepatosplenomegaly or masses, no abdominal bruits or hernia , no rebound or guarding.   Extremities: No lower extremity edema. No clubbing or deformities. Neuro: Alert and oriented x 3.  Grossly intact. Skin: Warm and dry, no jaundice.   Psych: Alert and cooperative, normal mood and affect.   Imaging Studies: US Abdomen Limited Ruq  Result Date: 11/14/2017 CLINICAL DATA:  77 year old female with elevated LFTs. Prior cholecystectomy. Initial encounter. EXAM: ULTRASOUND ABDOMEN LIMITED RIGHT UPPER QUADRANT COMPARISON:  07/08/2016 CT. FINDINGS: Gallbladder: Post cholecystectomy. Common bile duct: Diameter: 7.4 mm. Distal aspect not visualized secondary to bowel gas. Liver: No focal lesion identified. Within normal limits in parenchymal echogenicity. Question minimally lobulated contour without other findings of cirrhosis.  Portal vein is patent on color Doppler imaging with normal direction of blood flow towards the liver. IMPRESSION: Post cholecystectomy. Common bile duct 7.4 mm consistent with age and post cholecystectomy state. Distal aspect of the common bile duct not visualized secondary to bowel gas. Question minimally lobular contour of the liver without other findings of cirrhosis. Liver otherwise unremarkable. Electronically Signed   By: Genia Del M.D.   On: 11/14/2017 14:11    Assessment and Plan:   Kelly [name] is a 77 y.o. 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 She also has acute rise in LFT's noted 2 weeks back.   Plan  1. Repeat Sigmoidoscopy in 4 months ie 02/2018 2. Repeat LFT's if trending down will monitor if not resolving then will need further work up including MRCP as the CBD was minimally dilated which can be seen after a cholecystectomy.    Dr Jonathon Bellows  MD,MRCP [facility]) Follow up in  3 months    

## 2017-11-21 ENCOUNTER — Telehealth: Payer: Self-pay | Admitting: Gastroenterology

## 2017-11-21 ENCOUNTER — Telehealth: Payer: Self-pay

## 2017-11-21 DIAGNOSIS — L821 Other seborrheic keratosis: Secondary | ICD-10-CM | POA: Diagnosis not present

## 2017-11-21 DIAGNOSIS — C44622 Squamous cell carcinoma of skin of right upper limb, including shoulder: Secondary | ICD-10-CM | POA: Diagnosis not present

## 2017-11-21 DIAGNOSIS — D485 Neoplasm of uncertain behavior of skin: Secondary | ICD-10-CM | POA: Diagnosis not present

## 2017-11-21 DIAGNOSIS — X32XXXA Exposure to sunlight, initial encounter: Secondary | ICD-10-CM | POA: Diagnosis not present

## 2017-11-21 DIAGNOSIS — D2261 Melanocytic nevi of right upper limb, including shoulder: Secondary | ICD-10-CM | POA: Diagnosis not present

## 2017-11-21 DIAGNOSIS — D2272 Melanocytic nevi of left lower limb, including hip: Secondary | ICD-10-CM | POA: Diagnosis not present

## 2017-11-21 DIAGNOSIS — Z08 Encounter for follow-up examination after completed treatment for malignant neoplasm: Secondary | ICD-10-CM | POA: Diagnosis not present

## 2017-11-21 DIAGNOSIS — L570 Actinic keratosis: Secondary | ICD-10-CM | POA: Diagnosis not present

## 2017-11-21 DIAGNOSIS — D2262 Melanocytic nevi of left upper limb, including shoulder: Secondary | ICD-10-CM | POA: Diagnosis not present

## 2017-11-21 DIAGNOSIS — Z85828 Personal history of other malignant neoplasm of skin: Secondary | ICD-10-CM | POA: Diagnosis not present

## 2017-11-21 DIAGNOSIS — D225 Melanocytic nevi of trunk: Secondary | ICD-10-CM | POA: Diagnosis not present

## 2017-11-21 DIAGNOSIS — D2271 Melanocytic nevi of right lower limb, including hip: Secondary | ICD-10-CM | POA: Diagnosis not present

## 2017-11-21 NOTE — Telephone Encounter (Signed)
LVM for pt to contact office to provided her with results.  Thanks [employer]

## 2017-11-21 NOTE — Telephone Encounter (Signed)
Pt left vm she missed a call for her results on bloodwork

## 2017-11-22 NOTE — Telephone Encounter (Signed)
Patients daughter has been informed of normalized liver function test-no further work up needed.  Will mail a copy to patient.  Thanks [employer]

## 2017-12-04 ENCOUNTER — Telehealth: Payer: Self-pay | Admitting: Pharmacist

## 2017-12-04 NOTE — Telephone Encounter (Signed)
Patient last seen in Pharmacy Clinic on 11/06/2017. Victoza was increased to 1.8 mg daily. Patient given instructions to follow up with me in clinic in 4 weeks, however, my schedule was not opened until today.  Left HIPAA compliant message asking patient to schedule f/u appointment with her at her convenience.   [name], PharmD PGY2 Ambulatory Care Pharmacy Resident Phone: [phone]

## 2017-12-13 DIAGNOSIS — C44622 Squamous cell carcinoma of skin of right upper limb, including shoulder: Secondary | ICD-10-CM | POA: Diagnosis not present

## 2017-12-17 ENCOUNTER — Other Ambulatory Visit: Payer: Self-pay | Admitting: Family Medicine

## 2017-12-17 DIAGNOSIS — E785 Hyperlipidemia, unspecified: Secondary | ICD-10-CM

## 2017-12-21 DIAGNOSIS — M5032 Other cervical disc degeneration, mid-cervical region, unspecified level: Secondary | ICD-10-CM | POA: Diagnosis not present

## 2017-12-21 DIAGNOSIS — M6281 Muscle weakness (generalized): Secondary | ICD-10-CM | POA: Diagnosis not present

## 2017-12-21 DIAGNOSIS — M47812 Spondylosis without myelopathy or radiculopathy, cervical region: Secondary | ICD-10-CM | POA: Diagnosis not present

## 2017-12-21 DIAGNOSIS — M545 Low back pain: Secondary | ICD-10-CM | POA: Diagnosis not present

## 2017-12-21 DIAGNOSIS — M7062 Trochanteric bursitis, left hip: Secondary | ICD-10-CM | POA: Diagnosis not present

## 2017-12-21 DIAGNOSIS — M25572 Pain in left ankle and joints of left foot: Secondary | ICD-10-CM | POA: Diagnosis not present

## 2017-12-21 DIAGNOSIS — M542 Cervicalgia: Secondary | ICD-10-CM | POA: Diagnosis not present

## 2017-12-21 DIAGNOSIS — M25519 Pain in unspecified shoulder: Secondary | ICD-10-CM | POA: Diagnosis not present

## 2017-12-21 DIAGNOSIS — T2101XA Burn of unspecified degree of chest wall, initial encounter: Secondary | ICD-10-CM | POA: Diagnosis not present

## 2017-12-21 DIAGNOSIS — R52 Pain, unspecified: Secondary | ICD-10-CM | POA: Diagnosis not present

## 2017-12-21 DIAGNOSIS — Z79899 Other long term (current) drug therapy: Secondary | ICD-10-CM | POA: Diagnosis not present

## 2017-12-21 DIAGNOSIS — M5136 Other intervertebral disc degeneration, lumbar region: Secondary | ICD-10-CM | POA: Diagnosis not present

## 2017-12-21 DIAGNOSIS — E1165 Type 2 diabetes mellitus with hyperglycemia: Secondary | ICD-10-CM | POA: Diagnosis not present

## 2017-12-22 DIAGNOSIS — N189 Chronic kidney disease, unspecified: Secondary | ICD-10-CM | POA: Diagnosis not present

## 2017-12-22 DIAGNOSIS — E1140 Type 2 diabetes mellitus with diabetic neuropathy, unspecified: Secondary | ICD-10-CM | POA: Diagnosis not present

## 2017-12-22 DIAGNOSIS — I10 Essential (primary) hypertension: Secondary | ICD-10-CM | POA: Diagnosis not present

## 2017-12-22 DIAGNOSIS — G4733 Obstructive sleep apnea (adult) (pediatric): Secondary | ICD-10-CM | POA: Diagnosis not present

## 2017-12-22 DIAGNOSIS — E785 Hyperlipidemia, unspecified: Secondary | ICD-10-CM | POA: Diagnosis not present

## 2017-12-26 ENCOUNTER — Ambulatory Visit: Payer: Medicare Other | Admitting: Gastroenterology

## 2017-12-30 DIAGNOSIS — E119 Type 2 diabetes mellitus without complications: Secondary | ICD-10-CM | POA: Diagnosis not present

## 2017-12-30 DIAGNOSIS — M47812 Spondylosis without myelopathy or radiculopathy, cervical region: Secondary | ICD-10-CM | POA: Diagnosis not present

## 2018-01-16 DIAGNOSIS — M5136 Other intervertebral disc degeneration, lumbar region: Secondary | ICD-10-CM | POA: Diagnosis not present

## 2018-01-16 DIAGNOSIS — M545 Low back pain: Secondary | ICD-10-CM | POA: Diagnosis not present

## 2018-01-16 DIAGNOSIS — M25519 Pain in unspecified shoulder: Secondary | ICD-10-CM | POA: Diagnosis not present

## 2018-01-16 DIAGNOSIS — M47812 Spondylosis without myelopathy or radiculopathy, cervical region: Secondary | ICD-10-CM | POA: Diagnosis not present

## 2018-01-16 DIAGNOSIS — M5032 Other cervical disc degeneration, mid-cervical region, unspecified level: Secondary | ICD-10-CM | POA: Diagnosis not present

## 2018-01-16 DIAGNOSIS — M7062 Trochanteric bursitis, left hip: Secondary | ICD-10-CM | POA: Diagnosis not present

## 2018-01-16 DIAGNOSIS — Z79899 Other long term (current) drug therapy: Secondary | ICD-10-CM | POA: Diagnosis not present

## 2018-01-16 DIAGNOSIS — T2101XA Burn of unspecified degree of chest wall, initial encounter: Secondary | ICD-10-CM | POA: Diagnosis not present

## 2018-01-16 DIAGNOSIS — M25572 Pain in left ankle and joints of left foot: Secondary | ICD-10-CM | POA: Diagnosis not present

## 2018-01-16 DIAGNOSIS — M6281 Muscle weakness (generalized): Secondary | ICD-10-CM | POA: Diagnosis not present

## 2018-01-16 DIAGNOSIS — M542 Cervicalgia: Secondary | ICD-10-CM | POA: Diagnosis not present

## 2018-01-16 DIAGNOSIS — E1165 Type 2 diabetes mellitus with hyperglycemia: Secondary | ICD-10-CM | POA: Diagnosis not present

## 2018-01-26 ENCOUNTER — Telehealth: Payer: Self-pay | Admitting: Gastroenterology

## 2018-01-26 NOTE — Telephone Encounter (Signed)
Pt left vm she needs  A call regarding her mass and procedure she has 02/06/2018 she feels she may need to see the Doctor before procedure please call pt

## 2018-01-30 ENCOUNTER — Telehealth: Payer: Self-pay | Admitting: Gastroenterology

## 2018-01-30 NOTE — Telephone Encounter (Signed)
Called pt in regards to her concerns for upcoming procedure. LVM to return call

## 2018-01-30 NOTE — Telephone Encounter (Signed)
PT  Is returning a call please call her back

## 2018-02-05 ENCOUNTER — Encounter: Payer: Self-pay | Admitting: Gastroenterology

## 2018-02-05 ENCOUNTER — Ambulatory Visit (INDEPENDENT_AMBULATORY_CARE_PROVIDER_SITE_OTHER): Payer: Medicare Other | Admitting: Gastroenterology

## 2018-02-05 VITALS — BP 137/78 | HR 78 | Ht 64.0 in | Wt 146.4 lb

## 2018-02-05 DIAGNOSIS — K626 Ulcer of anus and rectum: Secondary | ICD-10-CM | POA: Diagnosis not present

## 2018-02-05 NOTE — Progress Notes (Signed)
Jonathon Bellows MD, MRCP(U.K) [address]  [place]  [place], [place] [address]  Main: [phone]  Fax: [phone]   Primary Care Physician: [name], MD  Primary Gastroenterologist:  Dr. Jonathon Bellows   Chief Complaint  Patient presents with  . Follow-up    Pt would like to discuss upcoming procedure    HPI: Kelly [name] is a 77 y.o. female      Summary of history :  She is here to follow up for rectal bleeding and a rectal mass . She was initially referred and seen on 08/15/17 for rectal bleeding ongoing for several years.   08/22/17- Colonoscopy  - 10 mm tubular adenoma excised in the ascending colon and a mass like lesion seen in the distal rectum close to the anus and bx showed no malignancy , was benign . 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   Interval history   11/20/2017-02/05/18  Last visit was noted to have elevated LFT's .No new medications but Victoza dose was increased, denies being sick around that . Does not take any tylenol.  Repeat LFT;s 11/20/17- normalized   She wanted to discuss need for sigmoidoscopy tomorrow. I explained reason that the area in rectum looks abnormal but so far all biopsies have returned as benign.   Current Outpatient Medications  Medication Sig Dispense Refill  . ALPRAZolam (XANAX) 0.5 MG tablet Take 0.5 tablets (0.25 mg total) by mouth 2 (two) times daily as needed for anxiety. 30 tablet 0  . amLODipine (NORVASC) 2.5 MG tablet Take 1 tablet (2.5 mg total) by mouth daily. 90 tablet 3  . aspirin EC 81 MG tablet Take 81 mg by mouth daily.    Marland Kitchen atorvastatin (LIPITOR) 10 MG tablet TAKE 1 TABLET DAILY 90 tablet 1  .  cyclobenzaprine (FLEXERIL) 10 MG tablet Take 1 tablet (10 mg total) by mouth 3 (three) times daily as needed for muscle spasms. 90 tablet 0  . furosemide (LASIX) 40 MG tablet Take 1 tablet (40 mg total) by mouth daily as needed. 90 tablet 0  . glucose blood (IGLUCOSE TEST STRIPS) test strip Use as instructed 100 each 12  . HYDROcodone-acetaminophen (NORCO) 5-325 MG tablet Norco 5 mg-325 mg tablet  Take 1 tablet twice a day by oral route as needed.    . Insulin Glargine (BASAGLAR KWIKPEN) 100 UNIT/ML SOPN Inject 0.1 mLs (10 Units total) into the skin at bedtime. (Patient taking differently: Inject 10 Units into the skin at bedtime. ) 1 pen 3  . Insulin Pen Needle (BD PEN NEEDLE NANO U/F) 32G X 4 MM MISC Use daily with victoza and basaglar 200 each 2  . Lancets MISC Use up to 4 times daily to check blood sugars. 200 each 11  . liraglutide (VICTOZA) 18 MG/3ML SOPN Inject 0.3 mLs (1.8 mg total) into the skin daily. 27 mL 3  . losartan (COZAAR) 50 MG tablet Take 50 mg by mouth daily.    Marland Kitchen PARoxetine (PAXIL-CR) 25 MG 24 hr tablet Take 1 tablet (25 mg total) by mouth daily. 90 tablet 3  . potassium chloride (K-DUR) 10 MEQ tablet potassium chloride ER 10 mEq tablet,extended release    . calcium citrate-vitamin  D (CITRACAL+D) 315-200 MG-UNIT tablet Take 1 tablet by mouth 2 (two) times daily.    Marland Kitchen gabapentin (NEURONTIN) 300 MG capsule Take 1 capsule (300 mg total) by mouth at bedtime. (Patient not taking: Reported on 11/06/2017) 90 capsule 0   No current facility-administered medications for this visit.     Allergies as of 02/05/2018 - Review Complete 02/05/2018  Allergen Reaction Noted  . Prilosec otc [omeprazole magnesium] Other (See Comments) 07/20/2016  . Sucralfate Other (See Comments) 07/20/2016  . Amoxicillin Diarrhea 06/21/2015  . Morphine and related Other (See Comments) 06/21/2015    ROS:  General: Negative for anorexia, weight loss, fever, chills, fatigue, weakness. ENT: Negative for  hoarseness, difficulty swallowing , nasal congestion. CV: Negative for chest pain, angina, palpitations, dyspnea on exertion, peripheral edema.  Respiratory: Negative for dyspnea at rest, dyspnea on exertion, cough, sputum, wheezing.  GI: See history of present illness. GU:  Negative for dysuria, hematuria, urinary incontinence, urinary frequency, nocturnal urination.  Endo: Negative for unusual weight change.    Physical Examination:   BP 137/78   Pulse 78   Ht 5\' 4"  (1.626 m)   Wt 146 lb 6.4 oz (66.4 kg)   BMI 25.13 kg/m   General: Well-nourished, well-developed in no acute distress.  Eyes: No icterus. Conjunctivae pink. Mouth: Oropharyngeal mucosa moist and pink , no lesions erythema or exudate. Lungs: Clear to auscultation bilaterally. Non-labored. Heart: Regular rate and rhythm, no murmurs rubs or gallops.  Abdomen: Bowel sounds are normal, nontender, nondistended, no hepatosplenomegaly or masses, no abdominal bruits or hernia , no rebound or guarding.   Extremities: No lower extremity edema. No clubbing or deformities. Neuro: Alert and oriented x 3.  Grossly intact. Skin: Warm and dry, no jaundice.   Psych: Alert and cooperative, normal mood and affect.   Imaging Studies: No results found.  Assessment and Plan:   Kelly [name] is a 77 y.o. 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 She also has acute rise in LFT's that has resolved   Plan  1. Repeat Sigmoidoscopy  As planned tomorrow. - explained reason to do so to rule out any neoplasm. Likely secondary to rectal ulcer syndrome,    Dr Jonathon Bellows  MD,MRCP [facility]) Follow up in 4 weeks

## 2018-02-06 ENCOUNTER — Ambulatory Visit: Payer: Medicare Other | Admitting: Anesthesiology

## 2018-02-06 ENCOUNTER — Encounter
Admission: RE | Disposition: A | Discharge status: Home / Self Care | Payer: Self-pay | Source: Ambulatory Visit | Attending: Gastroenterology

## 2018-02-06 ENCOUNTER — Other Ambulatory Visit: Payer: Self-pay | Admitting: Gastroenterology

## 2018-02-06 ENCOUNTER — Ambulatory Visit
Admission: RE | Admit: 2018-02-06 | Discharge: 2018-02-06 | Disposition: A | Discharge status: Home / Self Care | Payer: Medicare Other | Source: Ambulatory Visit | Attending: Gastroenterology | Admitting: Gastroenterology

## 2018-02-06 DIAGNOSIS — K6289 Other specified diseases of anus and rectum: Secondary | ICD-10-CM | POA: Diagnosis not present

## 2018-02-06 DIAGNOSIS — G4730 Sleep apnea, unspecified: Secondary | ICD-10-CM | POA: Diagnosis not present

## 2018-02-06 DIAGNOSIS — F329 Major depressive disorder, single episode, unspecified: Secondary | ICD-10-CM | POA: Diagnosis not present

## 2018-02-06 DIAGNOSIS — E785 Hyperlipidemia, unspecified: Secondary | ICD-10-CM | POA: Diagnosis not present

## 2018-02-06 DIAGNOSIS — E1122 Type 2 diabetes mellitus with diabetic chronic kidney disease: Secondary | ICD-10-CM | POA: Diagnosis not present

## 2018-02-06 DIAGNOSIS — Z794 Long term (current) use of insulin: Secondary | ICD-10-CM | POA: Diagnosis not present

## 2018-02-06 DIAGNOSIS — Z7982 Long term (current) use of aspirin: Secondary | ICD-10-CM | POA: Diagnosis not present

## 2018-02-06 DIAGNOSIS — Z885 Allergy status to narcotic agent status: Secondary | ICD-10-CM | POA: Insufficient documentation

## 2018-02-06 DIAGNOSIS — E119 Type 2 diabetes mellitus without complications: Secondary | ICD-10-CM | POA: Insufficient documentation

## 2018-02-06 DIAGNOSIS — I129 Hypertensive chronic kidney disease with stage 1 through stage 4 chronic kidney disease, or unspecified chronic kidney disease: Secondary | ICD-10-CM | POA: Diagnosis not present

## 2018-02-06 DIAGNOSIS — I10 Essential (primary) hypertension: Secondary | ICD-10-CM | POA: Insufficient documentation

## 2018-02-06 DIAGNOSIS — N184 Chronic kidney disease, stage 4 (severe): Secondary | ICD-10-CM | POA: Diagnosis not present

## 2018-02-06 DIAGNOSIS — K621 Rectal polyp: Secondary | ICD-10-CM | POA: Diagnosis not present

## 2018-02-06 DIAGNOSIS — G4733 Obstructive sleep apnea (adult) (pediatric): Secondary | ICD-10-CM | POA: Diagnosis not present

## 2018-02-06 DIAGNOSIS — Z86010 Personal history of colonic polyps: Secondary | ICD-10-CM | POA: Diagnosis not present

## 2018-02-06 HISTORY — PX: FLEXIBLE SIGMOIDOSCOPY: SHX5431

## 2018-02-06 LAB — GLUCOSE, CAPILLARY: Glucose-Capillary: 156 mg/dL — ABNORMAL HIGH (ref 70–99)

## 2018-02-06 SURGERY — SIGMOIDOSCOPY, FLEXIBLE
Anesthesia: General

## 2018-02-06 MED ORDER — LIDOCAINE HCL (PF) 1 % IJ SOLN
2.0000 mL | Freq: Once | INTRAMUSCULAR | Status: AC
  Administered 2018-02-06: 0.3 mL via INTRADERMAL

## 2018-02-06 MED ORDER — PROPOFOL 10 MG/ML IV BOLUS
INTRAVENOUS | Status: DC | PRN
  Administered 2018-02-06 (×4): 20 mg via INTRAVENOUS
  Administered 2018-02-06: 10 mg via INTRAVENOUS

## 2018-02-06 MED ORDER — PROPOFOL 10 MG/ML IV BOLUS
INTRAVENOUS | Status: AC
  Filled 2018-02-06: qty 20

## 2018-02-06 MED ORDER — LIDOCAINE HCL (PF) 1 % IJ SOLN
INTRAMUSCULAR | Status: AC
  Administered 2018-02-06: 0.3 mL via INTRADERMAL
  Filled 2018-02-06: qty 2

## 2018-02-06 MED ORDER — SODIUM CHLORIDE 0.9 % IV SOLN
INTRAVENOUS | Status: DC
  Administered 2018-02-06: 1000 mL via INTRAVENOUS

## 2018-02-06 NOTE — Anesthesia Postprocedure Evaluation (Signed)
Anesthesia Post Note  Patient: Kelly [name]  Procedure(s) Performed: FLEXIBLE SIGMOIDOSCOPY (N/A )  Patient location during evaluation: PACU Anesthesia Type: General Level of consciousness: awake and alert Pain management: pain level controlled Vital Signs Assessment: post-procedure vital signs reviewed and stable Respiratory status: spontaneous breathing, nonlabored ventilation, respiratory function stable and patient connected to nasal cannula oxygen Cardiovascular status: blood pressure returned to baseline and stable Postop Assessment: no apparent nausea or vomiting Anesthetic complications: no     Last Vitals:  Vitals:   02/06/18 1100 02/06/18 1110  BP: (!) 155/82 (!) 159/72  Pulse: 64 66  Resp: (!) 26 18  Temp:    SpO2: 100% 99%    Last Pain:  Vitals:   02/06/18 1040  TempSrc: Tympanic                 Molli Barrows

## 2018-02-06 NOTE — Anesthesia Post-op Follow-up Note (Signed)
Anesthesia QCDR form completed.        

## 2018-02-06 NOTE — Anesthesia Preprocedure Evaluation (Signed)
Anesthesia Evaluation  Patient identified by MRN, date of birth, ID band Patient awake    Reviewed: Allergy & Precautions, H&P , NPO status , Patient's Chart, lab work & pertinent test results, reviewed documented beta blocker date and time   Airway Mallampati: II   Neck ROM: full    Dental  (+) Poor Dentition   Pulmonary neg pulmonary ROS, sleep apnea and Continuous Positive Airway Pressure Ventilation ,    Pulmonary exam normal        Cardiovascular Exercise Tolerance: Poor hypertension, On Medications negative cardio ROS Normal cardiovascular exam Rhythm:regular Rate:Normal     Neuro/Psych PSYCHIATRIC DISORDERS Depression negative neurological ROS  negative psych ROS   GI/Hepatic negative GI ROS, Neg liver ROS,   Endo/Other  negative endocrine ROSdiabetes, Well Controlled, Type 2, Oral Hypoglycemic Agents  Renal/GU Renal diseasenegative Renal ROS  negative genitourinary   Musculoskeletal   Abdominal   Peds  Hematology negative hematology ROS (+)   Anesthesia Other Findings Past Medical History: No date: Colon polyps No date: Depression No date: DM (diabetes mellitus), type 2 with renal complications (HCC) No date: Hyperlipidemia No date: Hypertension No date: Kidney stones No date: Sleep apnea 12/2015: 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  Past Surgical History: No date: APPENDECTOMY 03/2017: cataract No date: CHOLECYSTECTOMY No date: COLONOSCOPY 08/22/2017: COLONOSCOPY WITH PROPOFOL; N/A     Comment:  Procedure: COLONOSCOPY WITH PROPOFOL;  Surgeon: Jonathon Bellows, MD;  Location[facility] ENDOSCOPY;  Service:               Gastroenterology;  Laterality: N/A; No date: ESOPHAGOGASTRODUODENOSCOPY 03/23/2016: ESOPHAGOGASTRODUODENOSCOPY (EGD) WITH PROPOFOL; N/A     Comment:  Procedure: ESOPHAGOGASTRODUODENOSCOPY (EGD) WITH               PROPOFOL;  Surgeon: Manya Silvas, MD;  Location: [facility]              ENDOSCOPY;  Service: Endoscopy;  Laterality: N/A; 2012: RECTAL PROLAPSE REPAIR     Comment:  x2 BMI    Body Mass Index:  24.72 kg/m     Reproductive/Obstetrics negative OB ROS                             Anesthesia Physical Anesthesia Plan  ASA: III  Anesthesia Plan: General   Post-op Pain Management:    Induction:   PONV Risk Score and Plan:   Airway Management Planned:   Additional Equipment:   Intra-op Plan:   Post-operative Plan:   Informed Consent: I have reviewed the patients History and Physical, chart, labs and discussed the procedure including the risks, benefits and alternatives for the proposed anesthesia with the patient or authorized representative who has indicated his/her understanding and acceptance.   Dental Advisory Given  Plan Discussed with: CRNA  Anesthesia Plan Comments:         Anesthesia Quick Evaluation

## 2018-02-06 NOTE — Transfer of Care (Signed)
Immediate Anesthesia Transfer of Care Note  Patient: Kelly [name]  Procedure(s) Performed: FLEXIBLE SIGMOIDOSCOPY (N/A )  Patient Location: PACU  Anesthesia Type:General  Level of Consciousness: awake, alert  and oriented  Airway & Oxygen Therapy: Patient Spontanous Breathing and Patient connected to nasal cannula oxygen  Post-op Assessment: Report given to RN and Post -op Vital signs reviewed and stable  Post vital signs: Reviewed and stable  Last Vitals:  Vitals Value Taken Time  BP 127/72 02/06/2018 10:42 AM  Temp 36.1 C 02/06/2018 10:40 AM  Pulse 75 02/06/2018 10:43 AM  Resp 17 02/06/2018 10:43 AM  SpO2 100 % 02/06/2018 10:43 AM  Vitals shown include unvalidated device data.  Last Pain:  Vitals:   02/06/18 1040  TempSrc: Tympanic         Complications: No apparent anesthesia complications

## 2018-02-06 NOTE — H&P (Signed)
Jonathon Bellows, MD [address] [place], [place], [place], [address] [address], [place], [place], [place], [address] Phone: [phone]  Fax: [phone]  Primary Care Physician:  [name], MD   Pre-Procedure History & Physical: HPI:  Kelly [name] is a 77 y.o. female is here for a sigmoidoscopy    Past Medical History:  Diagnosis Date  . Colon polyps   . Depression   . DM (diabetes mellitus), type 2 with renal complications ([place])   . Hyperlipidemia   . Hypertension   . Kidney stones   . Sleep apnea   . 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     Past Surgical History:  Procedure Laterality Date  . APPENDECTOMY    . cataract  03/2017  . CHOLECYSTECTOMY    . COLONOSCOPY    . COLONOSCOPY WITH PROPOFOL N/A 08/22/2017   Procedure: COLONOSCOPY WITH PROPOFOL;  Surgeon: Jonathon Bellows, MD;  Location: [facility] ENDOSCOPY;  Service: Gastroenterology;  Laterality: N/A;  . ESOPHAGOGASTRODUODENOSCOPY    . ESOPHAGOGASTRODUODENOSCOPY (EGD) WITH PROPOFOL N/A 03/23/2016   Procedure: ESOPHAGOGASTRODUODENOSCOPY (EGD) WITH PROPOFOL;  Surgeon: Manya Silvas, MD;  Location: [facility] ENDOSCOPY;  Service: Endoscopy;  Laterality: N/A;  . RECTAL PROLAPSE REPAIR  2012   x2    Prior to Admission medications   Medication Sig Start Date End Date Taking? Authorizing Provider  ALPRAZolam Duanne Moron) 0.5 MG tablet Take 0.5 tablets (0.25 mg total) by mouth 2 (two) times daily as needed for anxiety. 10/25/17   [name], MD  amLODipine (NORVASC) 2.5 MG tablet Take 1 tablet (2.5 mg total) by mouth daily. 08/10/17   [name], MD  aspirin EC 81 MG tablet Take 81 mg by mouth daily.    [provider]  atorvastatin (LIPITOR) 10 MG tablet TAKE 1 TABLET DAILY 12/18/17   [name], MD  calcium citrate-vitamin D (CITRACAL+D) 315-200 MG-UNIT tablet Take 1 tablet by mouth 2 (two) times daily.    [provider]    cyclobenzaprine (FLEXERIL) 10 MG tablet Take 1 tablet (10 mg total) by mouth 3 (three) times daily as needed for muscle spasms. 09/20/16   Coral Spikes, DO  furosemide (LASIX) 40 MG tablet Take 1 tablet (40 mg total) by mouth daily as needed. 01/11/17   Coral Spikes, DO  gabapentin (NEURONTIN) 300 MG capsule Take 1 capsule (300 mg total) by mouth at bedtime. Patient not taking: Reported on 11/06/2017 10/04/17   [name], MD  glucose blood (IGLUCOSE TEST STRIPS) test strip Use as instructed 11/04/16   Coral Spikes, DO  HYDROcodone-acetaminophen (NORCO) 5-325 MG tablet Norco 5 mg-325 mg tablet  Take 1 tablet twice a day by oral route as needed.    [provider]  Insulin Glargine (BASAGLAR KWIKPEN) 100 UNIT/ML SOPN Inject 0.1 mLs (10 Units total) into the skin at bedtime. Patient taking differently: Inject 10 Units into the skin at bedtime.  04/03/17   Crecencio Mc, MD  Insulin Pen Needle (BD PEN NEEDLE NANO U/F) 32G X 4 MM MISC Use daily with victoza and basaglar 11/06/17   [name], MD  Lancets MISC Use up to 4 times daily to check blood sugars. 11/04/16   Coral Spikes, DO  liraglutide (VICTOZA) 18 MG/3ML SOPN Inject 0.3 mLs (1.8 mg total) into the skin daily. 11/06/17   [name], MD  losartan (COZAAR) 50 MG  tablet Take 50 mg by mouth daily. 07/17/17   [provider]  PARoxetine (PAXIL-CR) 25 MG 24 hr tablet Take 1 tablet (25 mg total) by mouth daily. 08/05/17   [name], MD  potassium chloride (K-DUR) 10 MEQ tablet potassium chloride ER 10 mEq tablet,extended release    [provider]    Allergies as of 10/11/2017 - Review Complete 08/22/2017  Allergen Reaction Noted  . Prilosec otc [omeprazole magnesium] Other (See Comments) 07/20/2016  . Sucralfate Other (See Comments) 07/20/2016  . Amoxicillin Diarrhea 06/21/2015  . Morphine and related Other (See Comments) 06/21/2015    Family History  Problem Relation Age of Onset  .  Stroke Mother   . Arthritis Mother   . Aortic aneurysm Sister   . Lung cancer Sister   . Heart attack Father 88  . Breast cancer Sister 41    Social History   Socioeconomic History  . Marital status: Married    Spouse name: Not on file  . Number of children: Not on file  . Years of education: Not on file  . Highest education level: Not on file  Occupational History  . Not on file  Social Needs  . Financial resource strain: Not hard at all  . Food insecurity:    Worry: Never true    Inability: Never true  . Transportation needs:    Medical: No    Non-medical: No  Tobacco Use  . Smoking status: Never Smoker  . Smokeless tobacco: Never Used  Substance and Sexual Activity  . Alcohol use: No  . Drug use: No  . Sexual activity: Not Currently    Partners: Male  Lifestyle  . Physical activity:    Days per week: 0 days    Minutes per session: Not on file  . Stress: Not on file  Relationships  . Social connections:    Talks on phone: Not on file    Gets together: Not on file    Attends religious service: Not on file    Active member of club or organization: Not on file    Attends meetings of clubs or organizations: Not on file    Relationship status: Not on file  . Intimate partner violence:    Fear of current or ex partner: Not on file    Emotionally abused: Not on file    Physically abused: Not on file    Forced sexual activity: Not on file  Other Topics Concern  . Not on file  Social History Narrative  . Not on file    Review of Systems: See HPI, otherwise negative ROS  Physical Exam: BP (!) 169/84   Pulse 74   Resp 17   Ht 5\' 4"  (1.626 m)   Wt 65.3 kg   SpO2 96%   BMI 24.72 kg/m  General:   Alert,  pleasant and cooperative in NAD Head:  Normocephalic and atraumatic. Neck:  Supple; no masses or thyromegaly. Lungs:  Clear throughout to auscultation, normal respiratory effort.    Heart:  +S1, +S2, Regular rate and rhythm, No edema. Abdomen:  Soft,  nontender and nondistended. Normal bowel sounds, without guarding, and without rebound.   Neurologic:  Alert and  oriented x4;  grossly normal neurologically.  Impression/Plan: Kelly [name] is here for a sigmoidoscopy to evaluate rectal mass.   Risks, benefits, limitations, and alternatives regarding  colonoscopy have been reviewed with the patient.  Questions have been answered.  All parties agreeable.   Bailey Mech  Vicente Males, MD  02/06/2018, 10:13 AM

## 2018-02-06 NOTE — Op Note (Signed)
[facility] Patient Name: Kelly [name] Procedure Date: 02/06/2018 10:20 AM MRN: [id number] Account #: [id number] Date of Birth: 07-07-1940 Admit Type: Outpatient Age: 77 Room: [facility] ENDO ROOM 1 Gender: Female Note Status: Finalized Procedure:            Flexible Sigmoidoscopy Indications:          Follow-up of rectal polyps Providers:            Jonathon Bellows MD, MD Referring MD:         Angela Adam. Caryl Bis (Referring MD) Medicines:            Monitored Anesthesia Care Complications:        No immediate complications. Procedure:            Pre-Anesthesia Assessment:                       - Prior to the procedure, a History and Physical was                        performed, and patient medications, allergies and                        sensitivities were reviewed. The patient's tolerance of                        previous anesthesia was reviewed.                       - The risks and benefits of the procedure and the                        sedation options and risks were discussed with the                        patient. All questions were answered and informed                        consent was obtained.                       - ASA Grade Assessment: III - A patient with severe                        systemic disease.                       After obtaining informed consent, the scope was passed                        under direct vision. The Endoscope was introduced                        through the anus and advanced to the the left                        transverse colon. The flexible sigmoidoscopy was                        accomplished with ease. The patient tolerated the  procedure well. The quality of the bowel preparation                        was good. Findings:      The perianal and digital rectal examinations were normal.      A 20 mm polyp was found in the rectum. The polyp was sessile. Biopsies       were taken  with a cold forceps for histology. Impression:           - One 20 mm polyp in the rectum. Biopsied. Recommendation:       - Discharge patient to home (with escort). Procedure Code(s):    --- Professional ---                       [phone], Sigmoidoscopy, flexible; with biopsy, single or                        multiple Diagnosis Code(s):    --- Professional ---                       K62.1, Rectal polyp CPT copyright 2017 American Medical Association. All rights reserved. The codes documented in this report are preliminary and upon coder review may  be revised to meet current compliance requirements. Jonathon Bellows, MD Jonathon Bellows MD, MD 02/06/2018 10:42:18 AM This report has been signed electronically. Number of Addenda: 0 Note Initiated On: 02/06/2018 10:20 AM Total Procedure Duration: 0 hours 11 minutes 24 seconds       [facility]

## 2018-02-07 ENCOUNTER — Encounter: Payer: Self-pay | Admitting: Gastroenterology

## 2018-02-07 LAB — SURGICAL PATHOLOGY

## 2018-02-08 ENCOUNTER — Encounter: Payer: Self-pay | Admitting: Gastroenterology

## 2018-02-09 LAB — PATHOLOGY

## 2018-02-12 ENCOUNTER — Ambulatory Visit (INDEPENDENT_AMBULATORY_CARE_PROVIDER_SITE_OTHER): Payer: Medicare Other | Admitting: Family Medicine

## 2018-02-12 ENCOUNTER — Encounter: Payer: Self-pay | Admitting: Family Medicine

## 2018-02-12 ENCOUNTER — Telehealth: Payer: Self-pay

## 2018-02-12 VITALS — BP 136/84 | HR 79 | Temp 98.3°F | Ht 64.0 in | Wt 146.8 lb

## 2018-02-12 DIAGNOSIS — F32A Depression, unspecified: Secondary | ICD-10-CM

## 2018-02-12 DIAGNOSIS — N184 Chronic kidney disease, stage 4 (severe): Secondary | ICD-10-CM | POA: Diagnosis not present

## 2018-02-12 DIAGNOSIS — F329 Major depressive disorder, single episode, unspecified: Secondary | ICD-10-CM | POA: Diagnosis not present

## 2018-02-12 DIAGNOSIS — Z794 Long term (current) use of insulin: Secondary | ICD-10-CM | POA: Diagnosis not present

## 2018-02-12 DIAGNOSIS — I10 Essential (primary) hypertension: Secondary | ICD-10-CM

## 2018-02-12 DIAGNOSIS — R252 Cramp and spasm: Secondary | ICD-10-CM | POA: Insufficient documentation

## 2018-02-12 DIAGNOSIS — F419 Anxiety disorder, unspecified: Secondary | ICD-10-CM

## 2018-02-12 DIAGNOSIS — E1122 Type 2 diabetes mellitus with diabetic chronic kidney disease: Secondary | ICD-10-CM | POA: Diagnosis not present

## 2018-02-12 LAB — HEMOGLOBIN A1C: Hgb A1c MFr Bld: 8.9 % — ABNORMAL HIGH (ref 4.6–6.5)

## 2018-02-12 LAB — BASIC METABOLIC PANEL
BUN: 22 mg/dL (ref 6–23)
CO2: 33 mEq/L — ABNORMAL HIGH (ref 19–32)
Calcium: 9.2 mg/dL (ref 8.4–10.5)
Chloride: 103 mEq/L (ref 96–112)
Creatinine, Ser: 1.84 mg/dL — ABNORMAL HIGH (ref 0.40–1.20)
GFR: 28.29 mL/min — ABNORMAL LOW (ref >60.00)
Glucose, Bld: 224 mg/dL — ABNORMAL HIGH (ref 70–99)
Potassium: 4.9 mEq/L (ref 3.5–5.1)
Sodium: 140 mEq/L (ref 135–145)

## 2018-02-12 LAB — MAGNESIUM: Magnesium: 2.2 mg/dL (ref 1.5–2.5)

## 2018-02-12 MED ORDER — LOSARTAN POTASSIUM 50 MG PO TABS: 50.0000 mg | ORAL_TABLET | Freq: Every day | ORAL | 1 refills | Status: DC

## 2018-02-12 MED ORDER — CYCLOBENZAPRINE HCL 10 MG PO TABS: 5.0000 mg | ORAL_TABLET | Freq: Three times a day (TID) | ORAL | 0 refills | Status: DC | PRN

## 2018-02-12 NOTE — Telephone Encounter (Signed)
-----   Message from Jonathon Bellows, MD sent at 02/08/2018  9:26 AM EDT ----- Sherald [name] please inform patient that all the biopsies taken of the rectal lesion were benign, no cancer seen.  Repeat sigmoidoscopy in 6 to 8 months.  Dr Jonathon Bellows MD,MRCP [facility]) Gastroenterology/Hepatology Pager: [phone]

## 2018-02-12 NOTE — Assessment & Plan Note (Addendum)
Adequately controlled.  Continue current regimen.  She will check with her pharmacy regarding losartan in the recent recall.  She will let us know if her medication has been recalled.

## 2018-02-12 NOTE — Telephone Encounter (Signed)
Spoke with pt and informed her of biopsy results and Dr. Georgeann Oppenheim instructions to repeat in 6-8 months.

## 2018-02-12 NOTE — Assessment & Plan Note (Signed)
Worsening control.  We will check an A1c.  Advised to decrease ice cream intake.  She has cut back on Pepsi.  Will determine medication changes once A1c returns.

## 2018-02-12 NOTE — Assessment & Plan Note (Signed)
Suspect related to recent life changes.  Given she has felt down over a recent short period of time we will have her monitor this as she suspects it will improve once her move is completed.  If not improving consider venting or changing Paxil.  She is given return precautions.

## 2018-02-12 NOTE — Patient Instructions (Addendum)
Nice to see you. We will check lab work today and contact you with the results. Please check with your pharmacy to ensure that the losartan that you are on is not part of the recent recall. Please monitor your depression.  If it does not start to improve after moving please let us know. Please do not take the Xanax and the Flexeril at the same time.  Please monitor for drowsiness with these medications.

## 2018-02-12 NOTE — Progress Notes (Signed)
Tommi Rumps, MD Phone: [phone]  Kelly [name] is a 77 y.o. female who presents today for f/u.  CC: htn, dm, muscle cramps, depression/anxiety  HYPERTENSION  Disease Monitoring  Home BP Monitoring not checking Chest pain- no    Dyspnea- no Medications  Compliance-  Taking amlodipine, losartan.  Edema- no  DIABETES Disease Monitoring: Blood Sugar ranges-avg 249 recently Polyuria/phagia/dipsia- no      Optho- UTD Medications: Compliance- taking victoza, basaglar           Hypoglycemic symptoms- no Patient has been eating more ice cream.  She eats fried fish sandwiches from [employer].  She has cut back some on Pepsi intake.  Muscle cramps: Patient notes these are chronic intermittent issues typically occurring at night.  Can occur in her arms and legs.  She stays well-hydrated.  She rarely takes Flexeril for these.  Her daughter notes initially the 10 mg dose did make her drowsy.  She notes the Flexeril is beneficial.  Mostly occurs in her hands, feet, calves, and lower arms.  Depression/anxiety: Patient notes her depression has gotten a little bit worse recently.  Over the last several days she is felt down.  Her brother-in-law passed away and she is in the process of buying his house.  Notes that has been stressful.  She takes Xanax once a month if she gets upset.  This does not make her excessively drowsy.  She notes no SI.     Social History   Tobacco Use  Smoking Status Never Smoker  Smokeless Tobacco Never Used     ROS see history of present illness  Objective  Physical Exam Vitals:   02/12/18 1311  BP: 136/84  Pulse: 79  Temp: 98.3 F (36.8 C)  SpO2: 98%    BP Readings from Last 3 Encounters:  02/12/18 136/84  02/06/18 (!) 159/72  02/05/18 137/78   Wt Readings from Last 3 Encounters:  02/12/18 146 lb 12.8 oz (66.6 kg)  02/06/18 144 lb (65.3 kg)  02/05/18 146 lb 6.4 oz (66.4 kg)    Physical Exam  Constitutional: No distress.    Cardiovascular: Normal rate, regular rhythm and normal heart sounds.  Pulmonary/Chest: Effort normal and breath sounds normal.  Musculoskeletal: She exhibits no edema.  No lower arm or lower leg muscular tenderness or spasm noted  Neurological: She is alert.  Skin: Skin is warm and dry. She is not diaphoretic.     Assessment/Plan: Please see individual problem list.  Hypertension Adequately controlled.  Continue current regimen.  She will check with her pharmacy regarding losartan in the recent recall.  She will let us know if her medication has been recalled.  DM (diabetes mellitus), type 2 with renal complications ([facility]) Worsening control.  We will check an A1c.  Advised to decrease ice cream intake.  She has cut back on Pepsi.  Will determine medication changes once A1c returns.  Muscle cramps Chronic intermittent issue.  She has been taking Flexeril for this.  I warned against drowsiness with this.  We will check a BMP and magnesium.  Replete if needed.  Anxiety and depression Suspect related to recent life changes.  Given she has felt down over a recent short period of time we will have her monitor this as she suspects it will improve once her move is completed.  If not improving consider venting or changing Paxil.  She is given return precautions.   Orders Placed This Encounter  Procedures  . HgB A1c  . Basic  Metabolic Panel (BMET)  . Magnesium    Meds ordered this encounter  Medications  . cyclobenzaprine (FLEXERIL) 10 MG tablet    Sig: Take 0.5 tablets (5 mg total) by mouth 3 (three) times daily as needed for muscle spasms.    Dispense:  45 tablet    Refill:  0  . losartan (COZAAR) 50 MG tablet    Sig: Take 1 tablet (50 mg total) by mouth daily.    Dispense:  90 tablet    Refill:  1     Tommi Rumps, MD [place]

## 2018-02-12 NOTE — Assessment & Plan Note (Addendum)
Chronic intermittent issue.  She has been taking Flexeril for this.  I warned against drowsiness with this.  We will check a BMP and magnesium.  Replete if needed.

## 2018-02-19 ENCOUNTER — Ambulatory Visit (INDEPENDENT_AMBULATORY_CARE_PROVIDER_SITE_OTHER): Payer: Medicare Other | Admitting: Pharmacist

## 2018-02-19 ENCOUNTER — Encounter: Payer: Self-pay | Admitting: Pharmacist

## 2018-02-19 DIAGNOSIS — Z794 Long term (current) use of insulin: Secondary | ICD-10-CM | POA: Diagnosis not present

## 2018-02-19 DIAGNOSIS — E785 Hyperlipidemia, unspecified: Secondary | ICD-10-CM | POA: Diagnosis not present

## 2018-02-19 DIAGNOSIS — E1122 Type 2 diabetes mellitus with diabetic chronic kidney disease: Secondary | ICD-10-CM

## 2018-02-19 DIAGNOSIS — N184 Chronic kidney disease, stage 4 (severe): Secondary | ICD-10-CM

## 2018-02-19 MED ORDER — BASAGLAR KWIKPEN 100 UNIT/ML ~~LOC~~ SOPN: 12.0000 [IU] | PEN_INJECTOR | Freq: Every day | SUBCUTANEOUS | 3 refills | Status: DC

## 2018-02-19 NOTE — Assessment & Plan Note (Signed)
#  Diabetes - Currently uncontrolled, most recent A1c 8.9% on 02/12/2018; Goal A1c <7%. Patient denies hypoglycemic events. Patient reports adherence with medication. Control is suboptimal d/t diet choices. Appropriate to titrate insulin conservatively d/t renal dysfunction - Increase Basaglar to 12 units daily - Continue Victoza 1.8 mg daily - Extensively counseled on dietary modifications, appropriate portion sizes, and the importance of exercise - Next A1C anticipated 04/2018

## 2018-02-19 NOTE — Assessment & Plan Note (Signed)
#  ASCVD risk - primary prevention in patient aged 76-75 with DM; 10 year ASCVD risk >20%, however, patient is >18 years of age; moderate intensity statin appropriate - Continued aspirin 81 mg daily. Could consider discontinuation if bleed risk determined to be elevated moving forward - Continue atorvastatin 10 mg daily

## 2018-02-19 NOTE — Progress Notes (Signed)
S:     Chief Complaint  Patient presents with  . Medication Management    Diabetes    Patient arrives in good spirits, ambulating without assistance.  Presents for diabetes evaluation, education, and management at the request of Dr. Caryl Bis on last A1c check on 02/12/2018. She was last seen in Pharmacy Clinic on 11/06/2017 - at that time, Victoza was maximized to 1.8 mg daily.   Patients notes that she's had diabetes for at least 10 years  Insurance coverage/medication affordability: [place] [employer]  Patient reports adherence with medications.  Current diabetes medications include: Basaglar 10 units daily, Victoza 1.8 mg daily Current hypertension medications include: amlodipine 2.5 mg daily, losartan 50 mg daily  Patient denies hypoglycemic s/sx including dizziness, shakiness, sweating. Patient reports hyperglycemic symptoms including polyuria, polydipsia, polyphagia, blurred vision. Patient reports self foot exams.   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  O:   Physical Exam  Constitutional: She appears well-developed and well-nourished.   Review of Systems  All other systems reviewed and are negative.   Lab Results  Component Value Date   HGBA1C 8.9 (H) 02/12/2018   Vitals:   02/19/18 1525  BP: 135/81  Pulse: 81    Basic Metabolic  Panel BMP Latest Ref Rng & Units 02/12/2018 11/02/2017 03/22/2017  Glucose 70 - 99 mg/dL 224(H) 408(H) 208(H)  BUN 6 - 23 mg/dL 22 37(H) 27(H)  Creatinine 0.40 - 1.20 mg/dL 1.84(H) 2.30(H) 2.24(H)  Sodium 135 - 145 mEq/L 140 137 142  Potassium 3.5 - 5.1 mEq/L 4.9 5.1 3.7  Chloride 96 - 112 mEq/L 103 103 99  CO2 19 - 32 mEq/L 33(H) 29 35(H)  Calcium 8.4 - 10.5 mg/dL 9.2 8.9 9.6    Lipid Panel     Component Value Date/Time   CHOL 133 08/02/2017 1022   TRIG 131.0 08/02/2017 1022   HDL 30.30 (L) 08/02/2017 1022   CHOLHDL 4 08/02/2017 1022   VLDL 26.2 08/02/2017 1022   LDLCALC 77 08/02/2017 1022    Fasting SMBG: 160s-230s  Clinical ASCVD: No  ASCVD risk factors: age, CKD,  10 year ASCVD risk: 40.2%   A/P: Following discussion and approval by Dr. Caryl Bis, the following medication changes were made:   #Diabetes - Currently uncontrolled, most recent A1c 8.9% on 02/12/2018; Goal A1c <7%. Patient denies hypoglycemic events. Patient reports adherence with medication. Control is suboptimal d/t diet choices. Appropriate to titrate insulin conservatively d/t renal dysfunction - Increase Basaglar to 12 units daily - Continue Victoza 1.8 mg daily - Extensively counseled on dietary modifications, appropriate portion sizes, and the importance of exercise - Next A1C anticipated 04/2018  #Hypertension currently controlled. Goal BP <140/90. Patient reports adherence with medication. - Continue amlodipine 2.5 mg daily and losartan 50 mg daily  #ASCVD risk - primary prevention in patient aged 76-75 with DM; 10 year ASCVD risk >  20%, however, patient is >76 years of age; moderate intensity statin appropriate - Continued aspirin 81 mg daily. Could consider discontinuation if bleed risk determined to be elevated moving forward - Continue atorvastatin 10 mg daily  Written patient instructions provided.  Total time in face to face counseling 40 minutes.    Follow up in Pharmacist Clinic Visit 3  weeks.   [name], PharmD PGY2 Ambulatory Care Pharmacy Resident Phone: [phone]

## 2018-02-19 NOTE — Patient Instructions (Signed)
It was great to see you today!   We are going to increase Lantus to 12 units daily. Keep checking your blood sugars every morning. Bring your blood sugar meter to our next appointment.   Keep an eye on the things you are eating that have carbohydrates in them - breads, pastas, potatoes, sweets. These can increase your blood sugars rapidly.    Schedule follow up with me in 3 weeks to go over blood sugars and decide what to do next.     [name], PharmD

## 2018-02-20 ENCOUNTER — Ambulatory Visit: Payer: Medicare Other | Admitting: Gastroenterology

## 2018-02-20 NOTE — Progress Notes (Signed)
I have reviewed the above note and agree. I saw the patient with the clinical pharmacist.  Marion Seese, MD  

## 2018-03-02 DIAGNOSIS — S60512A Abrasion of left hand, initial encounter: Secondary | ICD-10-CM | POA: Diagnosis not present

## 2018-03-02 DIAGNOSIS — W5503XA Scratched by cat, initial encounter: Secondary | ICD-10-CM | POA: Diagnosis not present

## 2018-03-12 ENCOUNTER — Encounter: Payer: Self-pay | Admitting: Pharmacist

## 2018-03-12 ENCOUNTER — Ambulatory Visit (INDEPENDENT_AMBULATORY_CARE_PROVIDER_SITE_OTHER): Payer: Medicare Other

## 2018-03-12 ENCOUNTER — Ambulatory Visit (INDEPENDENT_AMBULATORY_CARE_PROVIDER_SITE_OTHER): Payer: Medicare Other | Admitting: Pharmacist

## 2018-03-12 VITALS — BP 126/75 | HR 90 | Temp 98.7°F | Wt 144.6 lb

## 2018-03-12 DIAGNOSIS — Z794 Long term (current) use of insulin: Secondary | ICD-10-CM | POA: Diagnosis not present

## 2018-03-12 DIAGNOSIS — S61452A Open bite of left hand, initial encounter: Secondary | ICD-10-CM

## 2018-03-12 DIAGNOSIS — N184 Chronic kidney disease, stage 4 (severe): Secondary | ICD-10-CM | POA: Diagnosis not present

## 2018-03-12 DIAGNOSIS — E1122 Type 2 diabetes mellitus with diabetic chronic kidney disease: Secondary | ICD-10-CM

## 2018-03-12 DIAGNOSIS — W5501XA Bitten by cat, initial encounter: Secondary | ICD-10-CM

## 2018-03-12 MED ORDER — BASAGLAR KWIKPEN 100 UNIT/ML ~~LOC~~ SOPN: 16.0000 [IU] | PEN_INJECTOR | Freq: Every day | SUBCUTANEOUS | 3 refills | Status: DC

## 2018-03-12 MED ORDER — AMOXICILLIN-POT CLAVULANATE 500-125 MG PO TABS: 1.0000 | ORAL_TABLET | Freq: Two times a day (BID) | ORAL | 0 refills | Status: DC

## 2018-03-12 NOTE — Patient Instructions (Addendum)
It was great to see you today!  We are going to increase Basaglar to 16 units daily.   Keep checking your blood sugar for me, but see if you can check twice daily: 1) Fasting, when you first wake up in the morning and 2) about 2 hours after eating supper.   Check your blood sugar anytime you feel funny (shaky, dizzy).   Keep up the good work with increasing how much you walk. See about incorporating 2 days of muscle strengthening (using small arm weights, exercise bands, etc)   Schedule follow up with me in 3-4 week for further insulin adjustments. Feel free to reach out with any questions or concerns!   [name], PharmD     Nice to see you. We are going to place you on antibiotics to prophylax for possible cat bite. I will see you back in 2 days for recheck to ensure that you have not developed any infection. If you develop any redness, increasing pain, drainage of pus, or any new or changing symptoms please go to the emergency department.

## 2018-03-12 NOTE — Assessment & Plan Note (Signed)
#  Diabetes - Currently uncontrolled, most recent A1c 8.9% on 02/12/2018; Goal A1c <7%. Patient denies hypoglycemic events. Appropriate to titrate insulin conservatively d/t renal dysfunction and continue to focus on dietary modifications. - Increase Basaglar to 16 units daily - Continue Victoza 1.8 mg daily - Add muscle strengthening exercises up to 2 days a week, as well as continuing to walk the dog a few days a week. - Add checking 2 hour post prandial SMBG checks after supper - Next A1C anticipated 04/2018

## 2018-03-12 NOTE — Progress Notes (Signed)
S:     Chief Complaint  Patient presents with  . Medication Management    Diabetes    Patient arrives in good spirits, ambulating without assistance and accompanied by her daughter. Presents for diabetes evaluation, education, and management at the request of Dr. Caryl Bis on last A1c check on 02/12/2018. She was last seen in Pharmacy Clinic on- 02/19/2018; at that time Basaglar was increased. She had not been checking SMBG.   Since that time, she has been checking fasting SMBG every few days. She notes that she has been walking her dog more, and has been actively thinking more about decreasing her carbohydrate intake.  Patients notes that she's had diabetes for at least 10 years  Insurance coverage/medication affordability: [place] [employer]  Patient reports adherence with medications.  Current diabetes medications include: Basaglar 12 units daily, Victoza 1.8 mg daily Current hypertension medications include: amlodipine 2.5 mg daily, losartan 50 mg daily  Patient denies hypoglycemic s/sx including dizziness, shakiness, sweating. Patient reports hyperglycemic symptoms including polyuria, polydipsia, polyphagia, blurred vision with no change since our last visit. Patient reports self foot exams.   Patient reported dietary habits: Eats 2 meals/day and a snack Breakfast: Usually eats an apple; water  Lunch: Typically eats a sandwich - pimiento cheese, ham, chicken salad; will get fish sandwich and french fries ~ 3 times a week; cut down to one piece of bread  Dinner: Hasn't been eating take out as much; ate mushrooms; salad, cucumber, carrots; occasional fish platter Snacks: Not much of a snacker; occasionally cheese and crackers  Drinks: Water; trying to cut down to 1 soda a day;   Patient reported exercise habits: Has been walking her [facility] dog a couple of a days a week;   O:   Physical Exam  Constitutional: She appears well-developed and well-nourished.   Review of  Systems  All other systems reviewed and are negative.   Lab Results  Component Value Date   HGBA1C 8.9 (H) 02/12/2018   Vitals:   03/12/18 1418  BP: 126/75  Pulse: 90    Basic Metabolic Panel BMP Latest Ref Rng & Units 02/12/2018 11/02/2017 03/22/2017  Glucose 70 - 99 mg/dL 224(H) 408(H) 208(H)  BUN 6 - 23 mg/dL 22 37(H) 27(H)  Creatinine 0.40 - 1.20 mg/dL 1.84(H) 2.30(H) 2.24(H)  Sodium 135 - 145 mEq/L 140 137 142  Potassium 3.5 - 5.1 mEq/L 4.9 5.1 3.7  Chloride 96 - 112 mEq/L 103 103 99  CO2 19 - 32 mEq/L 33(H) 29 35(H)  Calcium 8.4 - 10.5 mg/dL 9.2 8.9 9.6    Lipid Panel     Component Value Date/Time   CHOL 133 08/02/2017 1022   TRIG 131.0 08/02/2017 1022   HDL 30.30 (L) 08/02/2017 1022   CHOLHDL 4 08/02/2017 1022   VLDL 26.2 08/02/2017 1022   LDLCALC 77 08/02/2017 1022    SMBG:  7 day average 185   Clinical ASCVD: No  ASCVD risk factors: age, CKD,  10 year ASCVD risk: 40.2%   A/P: Following discussion and approval by Dr. Caryl Bis, the following medication changes were made:   #Diabetes - Currently uncontrolled, most recent A1c 8.9% on 02/12/2018; Goal A1c <7%. Patient denies hypoglycemic events. Appropriate to titrate insulin conservatively d/t renal dysfunction and continue to focus on dietary modifications. - Increase Basaglar to 16 units daily - Continue Victoza 1.8 mg daily - Add muscle strengthening exercises up to 2 days a week, as well as continuing to walk the dog a  few days a week. - Add checking 2 hour post prandial SMBG checks after supper - Next A1C anticipated 04/2018  #Hypertension currently controlled. Goal BP <140/90. Patient reports adherence with medication. - Continue amlodipine 2.5 mg daily and losartan 50 mg daily  #ASCVD risk - primary prevention in patient aged 53-75 with DM; 10 year ASCVD risk >20%, however, patient is >88 years of age; moderate intensity statin appropriate - Continued aspirin 81 mg daily. Could consider  discontinuation if bleed risk determined to be elevated moving forward - Continue atorvastatin 10 mg daily  Written patient instructions provided.  Total time in face to face counseling 40 minutes.    Follow up in Pharmacist Clinic Visit 3-4 weeks.   [name], PharmD PGY2 Ambulatory Care Pharmacy Resident Phone: [phone]

## 2018-03-13 DIAGNOSIS — W5501XA Bitten by cat, initial encounter: Secondary | ICD-10-CM

## 2018-03-13 DIAGNOSIS — S61452A Open bite of left hand, initial encounter: Secondary | ICD-10-CM | POA: Insufficient documentation

## 2018-03-13 NOTE — Addendum Note (Signed)
Addended by: [name] on: 03/13/2018 08:44 AM   Modules accepted: Level of Service

## 2018-03-13 NOTE — Progress Notes (Signed)
Tommi Rumps, MD Phone: [phone]  Kelly [name] is a 77 y.o. female who presents today for same day visit.   I agree with the pharmacists documentation. I saw the patient with the pharmacist.   CC: cat scratch vs cat bite  Patient reports cat scratch with possible cat bite to her left hand over the webspace between her thumb and index finger dorsally.  This occurred on 03/02/2018.  There had been some discomfort though this seems to have improved to some degree.  She reports serous drainage.  She has had no fever or spreading redness.  She was evaluated at the walk-in clinic for a cat scratch and placed on azithromycin.  She did not receive prophylaxis for cat bite.  She noted after she left the walk-in clinic she felt as though there may have been an area that could have been a bite.  She did note some swelling very briefly a couple of days after being seen at the walk-in clinic though that has resolved.  She notes she can take amoxicillin though she has gotten diarrhea with it in the past.  Social History   Tobacco Use  Smoking Status Never Smoker  Smokeless Tobacco Never Used     ROS see history of present illness  Objective  Physical Exam Vitals:   03/12/18 1418  BP: 126/75  Pulse: 90  Temp: 98.7 F (37.1 C)    BP Readings from Last 3 Encounters:  03/12/18 126/75  02/19/18 135/81  02/12/18 136/84   Wt Readings from Last 3 Encounters:  03/12/18 144 lb 9.6 oz (65.6 kg)  02/19/18 149 lb 3.2 oz (67.7 kg)  02/12/18 146 lb 12.8 oz (66.6 kg)    Physical Exam  Constitutional: No distress.  Pulmonary/Chest: Effort normal.  Musculoskeletal: She exhibits no edema.       Hands: Neurological: She is alert.  Skin: Skin is warm and dry. She is not diaphoretic.  There is no erythema, warmth, tenderness, or fluctuance at the site of the possible puncture wound in the patient's hand   Assessment/Plan: Please see individual problem list.  Cat bite left hand:  Possibly with a puncture wound from a cat bite.  There are not any significant signs of infection at this time.  I suspect that if she were going to develop infection from the cat bite it would have occurred sooner after the bite though given that it is on her hand we will cover prophylactically with Augmentin.  I will see her back in 2 days to ensure that she does not develop any infection.  Will obtain an x-ray to rule out foreign bodies.  She is given return precautions.   Orders Placed This Encounter  Procedures  . DG Hand Complete Left    Standing Status:   Future    Number of Occurrences:   1    Standing Expiration Date:   05/13/2019    Order Specific Question:   Reason for Exam (SYMPTOM  OR DIAGNOSIS REQUIRED)    Answer:   cat bite vs cat scratch, evaluation for foreign body    Order Specific Question:   Preferred imaging location?    Answer:   [facility] Specific Question:   Radiology Contrast Protocol - do NOT remove file path    Answer:   [email or web address]    Meds ordered this encounter  Medications  . Insulin Glargine (BASAGLAR KWIKPEN) 100 UNIT/ML SOPN    Sig: Inject 0.16 mLs (16 Units  total) into the skin at bedtime.    Dispense:  1 pen    Refill:  3  . amoxicillin-clavulanate (AUGMENTIN) 500-125 MG tablet    Sig: Take 1 tablet (500 mg total) by mouth 2 (two) times daily.    Dispense:  10 tablet    Refill:  0  Augmentin is renally dosed.   Tommi Rumps, MD [place]

## 2018-03-13 NOTE — Assessment & Plan Note (Signed)
Possibly with a puncture wound from a cat bite.  There are not any significant signs of infection at this time.  I suspect that if she were going to develop infection from the cat bite it would have occurred sooner after the bite though given that it is on her hand we will cover prophylactically with Augmentin.  I will see her back in 2 days to ensure that she does not develop any infection.  Will obtain an x-ray to rule out foreign bodies.  She is given return precautions.

## 2018-03-14 ENCOUNTER — Ambulatory Visit (INDEPENDENT_AMBULATORY_CARE_PROVIDER_SITE_OTHER): Payer: Medicare Other | Admitting: Family Medicine

## 2018-03-14 ENCOUNTER — Encounter: Payer: Self-pay | Admitting: Gastroenterology

## 2018-03-14 ENCOUNTER — Ambulatory Visit (INDEPENDENT_AMBULATORY_CARE_PROVIDER_SITE_OTHER): Payer: Medicare Other | Admitting: Gastroenterology

## 2018-03-14 ENCOUNTER — Encounter: Payer: Self-pay | Admitting: Family Medicine

## 2018-03-14 VITALS — BP 122/70 | HR 77 | Ht 64.0 in | Wt 145.6 lb

## 2018-03-14 DIAGNOSIS — W5501XD Bitten by cat, subsequent encounter: Secondary | ICD-10-CM

## 2018-03-14 DIAGNOSIS — K5900 Constipation, unspecified: Secondary | ICD-10-CM

## 2018-03-14 DIAGNOSIS — K621 Rectal polyp: Secondary | ICD-10-CM | POA: Diagnosis not present

## 2018-03-14 DIAGNOSIS — S61452D Open bite of left hand, subsequent encounter: Secondary | ICD-10-CM

## 2018-03-14 NOTE — Patient Instructions (Signed)
Nice to see you. I am glad your hand is doing well. Please monitor the diarrhea and if it worsens or you develop blood in your stool please be evaluated. If you develop increased pain, swelling, redness, drainage, fevers, or any new or changing symptoms please go to the emergency department. If you do not continue to improve please contact us.

## 2018-03-14 NOTE — Progress Notes (Signed)
  Tommi Rumps, MD Phone: [phone]  Kelly [name] is a 77 y.o. female who presents today for follow-up.  CC: Cat scratch/cat bite  Patient seen 2 days ago for cat scratch with possible cat bite.  She notes her hand appears to be healing well.  She has no pain unless she pushes on the area.  She has had no redness.  No fever.  No swelling or drainage.  She is taking prophylactic Augmentin.  She did take a stool softener today after seeing her GI physician and has had some loose stools.  No blood in her stool.  No abdominal pain.  She had an x-ray of her hand that did not reveal any foreign bodies.  Social History   Tobacco Use  Smoking Status Never Smoker  Smokeless Tobacco Never Used     ROS see history of present illness  Objective  Physical Exam Vitals:   03/14/18 1521  BP: 110/60  Pulse: 84  Temp: 98.1 F (36.7 C)  SpO2: 96%    BP Readings from Last 3 Encounters:  03/14/18 110/60  03/14/18 122/70  03/12/18 126/75   Wt Readings from Last 3 Encounters:  03/14/18 147 lb 3.2 oz (66.8 kg)  03/14/18 145 lb 9.6 oz (66 kg)  03/12/18 144 lb 9.6 oz (65.6 kg)    Physical Exam  Constitutional: She appears well-developed and well-nourished.  Pulmonary/Chest: Effort normal.  Musculoskeletal:       Hands: Hand is warm and well-perfused, 5/5 grip strength on the left, sensation light touch intact on the left   Assessment/Plan: Please see individual problem list.  Cat bite of left hand Healing well.  No signs of infection.  She will complete the prophylactic Augmentin.  She will monitor her loose stools and if they worsen she will be evaluated again.  If her wounds do not continue to heal she will let us know.  If she develops any worsening symptoms or new symptoms she will be reevaluated immediately.  Return precautions given.    No orders of the defined types were placed in this encounter.   No orders of the defined types were placed in this  encounter.    Tommi Rumps, MD [place]

## 2018-03-14 NOTE — Assessment & Plan Note (Signed)
Healing well.  No signs of infection.  She will complete the prophylactic Augmentin.  She will monitor her loose stools and if they worsen she will be evaluated again.  If her wounds do not continue to heal she will let us know.  If she develops any worsening symptoms or new symptoms she will be reevaluated immediately.  Return precautions given.

## 2018-03-14 NOTE — Progress Notes (Signed)
Jonathon Bellows MD, MRCP(U.K) [address]  [place]  [place], [place] [address]  Main: [phone]  Fax: [phone]   Primary Care Physician: [name], MD  Primary Gastroenterologist:  Dr. Jonathon Bellows   Chief Complaint  Patient presents with  . Follow-up    Recatl Ulcer    HPI: Kelly [name] is a 77 y.o. female   Summary of history :  She is here to follow up for rectal bleeding and a rectal mass . She was initially referred and seen on 08/15/17 for rectal bleeding ongoing for several years.  08/22/17- Colonoscopy - 10 mm tubular adenoma excised in the ascending colon and a mass like lesion seen in the distal rectum close to the anus and bx showed no malignancy , was benign .  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  Interval history9/30/19-11/6/19   02/06/18 : sigmoidoscopy :A 20 mm polyp was found in the distal  rectum. The polyp was sessile.Biopsy showed : FRAGMENTS OF POLYPOID REGENERATIVE COLONIC-TYPE MUCOSA WITH BANDS OF SMOOTH MUSCLE, AREAS OF SUPERFICIAL ULCERATION, AND FIBRIN CAPS. - SQUAMOUS EPITHELIUM IS NOT IDENTIFIED. - NEGATIVE FOR DYSPLASIA AND MALIGNANCY  No rectal bleeding.  Takes stool softners- suffers from constipation.  Current Outpatient Medications  Medication Sig Dispense Refill  . ALPRAZolam (XANAX) 0.5 MG tablet Take 0.5 tablets (0.25 mg total) by mouth 2 (two) times daily as needed for anxiety. 30 tablet 0  . amLODipine (NORVASC) 2.5 MG tablet Take 1 tablet (2.5 mg total) by mouth daily. 90 tablet 3  . amoxicillin-clavulanate (AUGMENTIN) 500-125 MG tablet Take 1 tablet (500 mg total) by mouth 2 (two) times daily. 10 tablet 0  . aspirin EC 81 MG tablet Take 81 mg  by mouth daily.    Marland Kitchen atorvastatin (LIPITOR) 10 MG tablet TAKE 1 TABLET DAILY 90 tablet 1  . cyclobenzaprine (FLEXERIL) 10 MG tablet Take 0.5 tablets (5 mg total) by mouth 3 (three) times daily as needed for muscle spasms. 45 tablet 0  . furosemide (LASIX) 40 MG tablet Take 1 tablet (40 mg total) by mouth daily as needed. 90 tablet 0  . glucose blood (IGLUCOSE TEST STRIPS) test strip Use as instructed 100 each 12  . HYDROcodone-acetaminophen (NORCO) 5-325 MG tablet Norco 5 mg-325 mg tablet  Take 1 tablet twice a day by oral route as needed.    . Insulin Glargine (BASAGLAR KWIKPEN) 100 UNIT/ML SOPN Inject 0.16 mLs (16 Units total) into the skin at bedtime. 1 pen 3  . Insulin Pen Needle (BD PEN NEEDLE NANO U/F) 32G X 4 MM MISC Use daily with victoza and basaglar 200 each 2  . Lancets MISC Use up to 4 times daily to check blood sugars. 200 each 11  . liraglutide (VICTOZA) 18 MG/3ML SOPN Inject 0.3 mLs (1.8 mg total) into the skin daily. 27 mL 3  . losartan (COZAAR) 50 MG tablet Take 1 tablet (50 mg total) by mouth daily. 90 tablet 1  . PARoxetine (PAXIL-CR) 25 MG 24 hr tablet Take 1 tablet (25 mg total) by mouth daily. 90 tablet 3  . potassium chloride (K-DUR) 10 MEQ tablet potassium chloride ER 10 mEq tablet,extended release     No current facility-administered medications for this visit.     Allergies as  of 03/14/2018 - Review Complete 02/19/2018  Allergen Reaction Noted  . Prilosec otc [omeprazole magnesium] Other (See Comments) 07/20/2016  . Sucralfate Other (See Comments) 07/20/2016  . Amoxicillin Diarrhea 06/21/2015  . Morphine and related Other (See Comments) 06/21/2015    ROS:  General: Negative for anorexia, weight loss, fever, chills, fatigue, weakness. ENT: Negative for hoarseness, difficulty swallowing , nasal congestion. CV: Negative for chest pain, angina, palpitations, dyspnea on exertion, peripheral edema.  Respiratory: Negative for dyspnea at rest, dyspnea on exertion,  cough, sputum, wheezing.  GI: See history of present illness. GU:  Negative for dysuria, hematuria, urinary incontinence, urinary frequency, nocturnal urination.  Endo: Negative for unusual weight change.    Physical Examination:   BP 122/70   Pulse 77   Ht 5\' 4"  (1.626 m)   Wt 145 lb 9.6 oz (66 kg)   BMI 24.99 kg/m   General: Well-nourished, well-developed in no acute distress.  Eyes: No icterus. Conjunctivae pink. Mouth: Oropharyngeal mucosa moist and pink , no lesions erythema or exudate. Lungs: Clear to auscultation bilaterally. Non-labored. Heart: Regular rate and rhythm, no murmurs rubs or gallops.  Abdomen: Bowel sounds are normal, nontender, nondistended, no hepatosplenomegaly or masses, no abdominal bruits or hernia , no rebound or guarding.   Extremities: No lower extremity edema. No clubbing or deformities. Neuro: Alert and oriented x 3.  Grossly intact. Skin: Warm and dry, no jaundice.   Psych: Alert and cooperative, normal mood and affect.   Imaging Studies: Dg Hand Complete Left  Result Date: 03/12/2018 CLINICAL DATA:  Cat bite/scratch EXAM: LEFT HAND - COMPLETE 3+ VIEW COMPARISON:  None. FINDINGS: No fracture or dislocation. No suspicious focal osseous lesions. No osseous erosions or periosteal reaction. Diffuse osteopenia. No radiopaque foreign body or appreciable soft tissue gas. Mild osteoarthritis in the distal interphalangeal joints of second and third fingers. IMPRESSION: 1. No fracture or malalignment. No specific radiographic findings of osteomyelitis. 2. Diffuse osteopenia. 3. 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 Electronically Signed   By: Ilona Sorrel M.D.   On: 03/12/2018 17:25    Assessment and Plan:   Kelly [name] is a 77 y.o. 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    Plan  1. Repeat Sigmoidoscopy   in 6-8 months time.  2. Trial of Linzess 72 mcg- 2 weeks samples provided. If does not work will increase dose to 145 mcg a day , patient will call and inform my nurse.    I have discussed alternative options, risks & benefits,  which include, but are not limited to, bleeding, infection, perforation,respiratory complication & drug reaction.  The patient agrees with this plan & written consent will be obtained.     Dr Jonathon Bellows  MD,MRCP [facility]) Follow up in 9 months

## 2018-03-19 DIAGNOSIS — I10 Essential (primary) hypertension: Secondary | ICD-10-CM | POA: Diagnosis not present

## 2018-03-19 DIAGNOSIS — N184 Chronic kidney disease, stage 4 (severe): Secondary | ICD-10-CM | POA: Diagnosis not present

## 2018-03-19 DIAGNOSIS — E1129 Type 2 diabetes mellitus with other diabetic kidney complication: Secondary | ICD-10-CM | POA: Diagnosis not present

## 2018-03-22 DIAGNOSIS — E1129 Type 2 diabetes mellitus with other diabetic kidney complication: Secondary | ICD-10-CM | POA: Diagnosis not present

## 2018-03-22 DIAGNOSIS — I129 Hypertensive chronic kidney disease with stage 1 through stage 4 chronic kidney disease, or unspecified chronic kidney disease: Secondary | ICD-10-CM | POA: Diagnosis not present

## 2018-03-22 DIAGNOSIS — N184 Chronic kidney disease, stage 4 (severe): Secondary | ICD-10-CM | POA: Diagnosis not present

## 2018-03-28 ENCOUNTER — Other Ambulatory Visit: Payer: Self-pay | Admitting: Family Medicine

## 2018-03-28 DIAGNOSIS — Z794 Long term (current) use of insulin: Principal | ICD-10-CM

## 2018-03-28 DIAGNOSIS — E1122 Type 2 diabetes mellitus with diabetic chronic kidney disease: Secondary | ICD-10-CM

## 2018-03-28 DIAGNOSIS — N184 Chronic kidney disease, stage 4 (severe): Secondary | ICD-10-CM

## 2018-04-02 ENCOUNTER — Other Ambulatory Visit: Payer: Self-pay | Admitting: Family Medicine

## 2018-04-02 ENCOUNTER — Ambulatory Visit (INDEPENDENT_AMBULATORY_CARE_PROVIDER_SITE_OTHER): Payer: Medicare Other | Admitting: Pharmacist

## 2018-04-02 ENCOUNTER — Encounter: Payer: Self-pay | Admitting: Pharmacist

## 2018-04-02 ENCOUNTER — Telehealth: Payer: Self-pay | Admitting: Gastroenterology

## 2018-04-02 VITALS — BP 121/73 | HR 87 | Wt 144.2 lb

## 2018-04-02 DIAGNOSIS — E1122 Type 2 diabetes mellitus with diabetic chronic kidney disease: Secondary | ICD-10-CM | POA: Diagnosis not present

## 2018-04-02 DIAGNOSIS — N184 Chronic kidney disease, stage 4 (severe): Secondary | ICD-10-CM

## 2018-04-02 DIAGNOSIS — Z794 Long term (current) use of insulin: Secondary | ICD-10-CM | POA: Diagnosis not present

## 2018-04-02 DIAGNOSIS — R252 Cramp and spasm: Secondary | ICD-10-CM

## 2018-04-02 NOTE — Progress Notes (Signed)
I have reviewed the above note and agree. I saw the patient with clinical pharmacist.  Tommi Rumps, MD

## 2018-04-02 NOTE — Assessment & Plan Note (Signed)
#  Diabetes - Currently uncontrolled but improved; most recent A1c 8.9% on 02/12/2018; Goal A1c <7%, though a more lenient <8.0% goal may be appropriate given patient's age, memory impairment. Significant improvement in SMBG results - Continue Victoza 1.8 mg daily and Basaglar 16 units daily - Reinforced checking 2 hour post prandial BG results  - Will check A1c at next appointment in January

## 2018-04-02 NOTE — Progress Notes (Signed)
S:     Chief Complaint  Patient presents with  . Medication Management    Diabetes    Patient arrives in good spirits, ambulating without assistance and accompanied by her daughter. Presents for diabetes evaluation, education, and management at the request of Dr. Caryl Bis on last A1c check on 02/12/2018. She was last seen in Pharmacy Clinic on 03/12/2018; at that time Basaglar was increased.  Today, she reports significant improvement with BG. She has not been walking her dog as much lately, but has been trying to be more careful with eating less sugars and ice cream. She has decreased her soda consumption from 3/day to 2/day, and would like to try to decrease it further. She notes that her hand is healing well, though shows some bumps. She notes that Dr. Vicente Males gave her samples of Linzess, but she immediately had significant diarrhea from taking this medication - she had finished the course of Augmentin prior to starting Linzess.   Patients notes that she's had diabetes for at least 10 years  Insurance coverage/medication affordability: [place] [employer]  Patient reports adherence with medications.  Current diabetes medications include: Basaglar 16 units daily, Victoza 1.8 mg daily Current hypertension medications include: amlodipine 2.5 mg daily, losartan 50 mg daily  Patient denies hypoglycemic s/sx including dizziness, shakiness, sweating. Patient reports IMPROVEMENT in hyperglycemic symptoms including polyuria, polydipsia, polyphagia. Patient reports self foot exams.   Patient reported dietary habits: Eats 2 meals/day and a snack *Notes that sugars are higher when she eats ice cream Breakfast: Usually eats an apple; water; sometimes peanut butter on bread; occasionally cereal Lunch: Typically eats a sandwich - pimiento cheese, ham, chicken salad; will get fish sandwich and french fries ~ 3 times a week; cut down to one piece of bread  Dinner: Hasn't been eating take out as much; ate  mushrooms; salad, cucumber, carrots; occasional fish platter; pinto beans; sandwich; cheese and crackers with chicken salad sandwcih Snacks: Not much of a snacker; occasionally cheese and crackers  Drinks: Water; drinking ~2 sodas/day   Patient reported exercise habits: Has been raking leaves; d/t rain, has not been walking her [facility] dog as much. O:   Physical Exam  Constitutional: She appears well-developed and well-nourished.   Review of Systems  All other systems reviewed and are negative.   Lab Results  Component Value Date   HGBA1C 8.9 (H) 02/12/2018   Vitals:   04/02/18 1414  BP: 121/73  Pulse: 87    Basic Metabolic Panel BMP Latest Ref Rng & Units 02/12/2018 11/02/2017 03/22/2017  Glucose 70 - 99 mg/dL 224(H) 408(H) 208(H)  BUN 6 - 23 mg/dL 22 37(H) 27(H)  Creatinine 0.40 - 1.20 mg/dL 1.84(H) 2.30(H) 2.24(H)  Sodium 135 - 145 mEq/L 140 137 142  Potassium 3.5 - 5.1 mEq/L 4.9 5.1 3.7  Chloride 96 - 112 mEq/L 103 103 99  CO2 19 - 32 mEq/L 33(H) 29 35(H)  Calcium 8.4 - 10.5 mg/dL 9.2 8.9 9.6    Lipid Panel     Component Value Date/Time   CHOL 133 08/02/2017 1022   TRIG 131.0 08/02/2017 1022   HDL 30.30 (L) 08/02/2017 1022   CHOLHDL 4 08/02/2017 1022   VLDL 26.2 08/02/2017 1022   LDLCALC 77 08/02/2017 1022    SMBG:  7 day and 14 day average 161 - Lowest reading 79, 2 readings in the 80s-90s; one reading in the 200s, though she notes this was after eating ice cream   Clinical ASCVD: No  ASCVD risk factors: age, CKD,  10 year ASCVD risk: 40.2%   A/P: Following discussion and approval by Dr. Caryl Bis, the following medication changes were made:   #Diabetes - Currently uncontrolled but improved; most recent A1c 8.9% on 02/12/2018; Goal A1c <7%, though a more lenient <8.0% goal may be appropriate given patient's age, memory impairment. Significant improvement in SMBG results - Continue Victoza 1.8 mg daily and Basaglar 16 units daily - Reinforced  checking 2 hour post prandial BG results  - Will check A1c at next appointment in January  #Hypertension currently controlled. Goal BP <140/90. Patient reports adherence with medication. - Continue amlodipine 2.5 mg daily and losartan 50 mg daily  #ASCVD risk - primary prevention in patient aged 64-75 with DM; 10 year ASCVD risk >20%, however, patient is >39 years of age; moderate intensity statin appropriate - Continued aspirin 81 mg daily. Could consider discontinuation if bleed risk determined to be elevated moving forward - Continue atorvastatin 10 mg daily  Written patient instructions provided.  Total time in face to face counseling 30 minutes.    Follow up in Pharmacist Clinic Visit 6 weeks  [name], PharmD PGY2 Ambulatory Care Pharmacy Resident Phone: [phone]

## 2018-04-02 NOTE — Patient Instructions (Addendum)
It was great to see you today!  We are going to continue Lantus 16 units daily and Victoza 1.8 mg daily. Keep up the great work with walking your dog and staying active. That will help your sugars immensely!    Schedule follow up with me in January, anytime after the 7th. We'll check an A1c at that point and adjust your medications if we need to.    Feel free to reach out with any questions or concerns!   [name], PharmD

## 2018-04-02 NOTE — Telephone Encounter (Signed)
Pt is calling to let Dr. Vicente Males know the samples gave pt [place] samples are [place] she can not take them

## 2018-04-03 MED ORDER — CYCLOBENZAPRINE HCL 10 MG PO TABS: 5.0000 mg | ORAL_TABLET | Freq: Three times a day (TID) | ORAL | 0 refills | Status: DC | PRN

## 2018-04-10 ENCOUNTER — Telehealth: Payer: Self-pay | Admitting: Gastroenterology

## 2018-04-10 NOTE — Telephone Encounter (Signed)
Pt is returning a call  

## 2018-04-10 NOTE — Telephone Encounter (Signed)
Called pt regarding trial samples of Linzess 72 mcg.  Unable to contact LVM to return call

## 2018-04-16 DIAGNOSIS — D2272 Melanocytic nevi of left lower limb, including hip: Secondary | ICD-10-CM | POA: Diagnosis not present

## 2018-04-16 DIAGNOSIS — D485 Neoplasm of uncertain behavior of skin: Secondary | ICD-10-CM | POA: Diagnosis not present

## 2018-04-16 DIAGNOSIS — L821 Other seborrheic keratosis: Secondary | ICD-10-CM | POA: Diagnosis not present

## 2018-04-16 DIAGNOSIS — D2262 Melanocytic nevi of left upper limb, including shoulder: Secondary | ICD-10-CM | POA: Diagnosis not present

## 2018-04-16 DIAGNOSIS — D2261 Melanocytic nevi of right upper limb, including shoulder: Secondary | ICD-10-CM | POA: Diagnosis not present

## 2018-04-16 DIAGNOSIS — Z85828 Personal history of other malignant neoplasm of skin: Secondary | ICD-10-CM | POA: Diagnosis not present

## 2018-04-16 DIAGNOSIS — Z08 Encounter for follow-up examination after completed treatment for malignant neoplasm: Secondary | ICD-10-CM | POA: Diagnosis not present

## 2018-04-16 DIAGNOSIS — D0461 Carcinoma in situ of skin of right upper limb, including shoulder: Secondary | ICD-10-CM | POA: Diagnosis not present

## 2018-04-16 DIAGNOSIS — L570 Actinic keratosis: Secondary | ICD-10-CM | POA: Diagnosis not present

## 2018-04-16 DIAGNOSIS — X32XXXA Exposure to sunlight, initial encounter: Secondary | ICD-10-CM | POA: Diagnosis not present

## 2018-04-17 DIAGNOSIS — Q762 Congenital spondylolisthesis: Secondary | ICD-10-CM | POA: Diagnosis not present

## 2018-04-17 DIAGNOSIS — T2101XA Burn of unspecified degree of chest wall, initial encounter: Secondary | ICD-10-CM | POA: Diagnosis not present

## 2018-04-17 DIAGNOSIS — M6281 Muscle weakness (generalized): Secondary | ICD-10-CM | POA: Diagnosis not present

## 2018-04-17 DIAGNOSIS — M542 Cervicalgia: Secondary | ICD-10-CM | POA: Diagnosis not present

## 2018-04-17 DIAGNOSIS — M25519 Pain in unspecified shoulder: Secondary | ICD-10-CM | POA: Diagnosis not present

## 2018-04-17 DIAGNOSIS — M47812 Spondylosis without myelopathy or radiculopathy, cervical region: Secondary | ICD-10-CM | POA: Diagnosis not present

## 2018-04-17 DIAGNOSIS — M545 Low back pain: Secondary | ICD-10-CM | POA: Diagnosis not present

## 2018-04-17 DIAGNOSIS — E1165 Type 2 diabetes mellitus with hyperglycemia: Secondary | ICD-10-CM | POA: Diagnosis not present

## 2018-04-17 DIAGNOSIS — M25572 Pain in left ankle and joints of left foot: Secondary | ICD-10-CM | POA: Diagnosis not present

## 2018-04-17 DIAGNOSIS — M5032 Other cervical disc degeneration, mid-cervical region, unspecified level: Secondary | ICD-10-CM | POA: Diagnosis not present

## 2018-04-17 DIAGNOSIS — M5136 Other intervertebral disc degeneration, lumbar region: Secondary | ICD-10-CM | POA: Diagnosis not present

## 2018-04-17 DIAGNOSIS — M7062 Trochanteric bursitis, left hip: Secondary | ICD-10-CM | POA: Diagnosis not present

## 2018-04-20 ENCOUNTER — Other Ambulatory Visit: Payer: Self-pay | Admitting: Family Medicine

## 2018-04-20 DIAGNOSIS — Z794 Long term (current) use of insulin: Principal | ICD-10-CM

## 2018-04-20 DIAGNOSIS — E1122 Type 2 diabetes mellitus with diabetic chronic kidney disease: Secondary | ICD-10-CM

## 2018-04-20 DIAGNOSIS — N184 Chronic kidney disease, stage 4 (severe): Secondary | ICD-10-CM

## 2018-04-20 NOTE — Telephone Encounter (Signed)
Called pt again regarding her trial of the Linzess. Unable to contact. LVM to return call

## 2018-04-23 ENCOUNTER — Encounter: Payer: Self-pay | Admitting: Family

## 2018-04-23 ENCOUNTER — Ambulatory Visit (INDEPENDENT_AMBULATORY_CARE_PROVIDER_SITE_OTHER): Payer: Medicare Other | Admitting: Family

## 2018-04-23 VITALS — BP 108/58 | HR 91 | Temp 97.9°F | Wt 149.6 lb

## 2018-04-23 DIAGNOSIS — J029 Acute pharyngitis, unspecified: Secondary | ICD-10-CM | POA: Diagnosis not present

## 2018-04-23 LAB — POCT RAPID STREP A (OFFICE): Rapid Strep A Screen: POSITIVE — AB

## 2018-04-23 LAB — POCT INFLUENZA A/B: Influenza A, POC: NEGATIVE

## 2018-04-23 MED ORDER — AZITHROMYCIN 250 MG PO TABS: ORAL_TABLET | ORAL | 0 refills | Status: DC

## 2018-04-23 NOTE — Patient Instructions (Addendum)
Check blood sugar 2- 3 times per day.  Blood sugar in particular elevated when someone is sick.  Please be sure to call the office of blood sugars approach 250 and also ensure you to maintain regular follow-up with your primary.  Plain mucinex. No D or DM.  Plenty of water.  Start azithromycin   Strep Throat Strep throat is a bacterial infection of the throat. Your health care provider may call the infection tonsillitis or pharyngitis, depending on whether there is swelling in the tonsils or at the back of the throat. Strep throat is most common during the cold months of the year in children who are 2-21 years of age, but it can happen during any season in people of any age. This infection is spread from person to person (contagious) through coughing, sneezing, or close contact. What are the causes? Strep throat is caused by the bacteria called Streptococcus pyogenes. What increases the risk? This condition is more likely to develop in:  People who spend time in crowded places where the infection can spread easily.  People who have close contact with someone who has strep throat.  What are the signs or symptoms? Symptoms of this condition include:  Fever or chills.  Redness, swelling, or pain in the tonsils or throat.  Pain or difficulty when swallowing.  White or yellow spots on the tonsils or throat.  Swollen, tender glands in the neck or under the jaw.  Red rash all over the body (rare).  How is this diagnosed? This condition is diagnosed by performing a rapid strep test or by taking a swab of your throat (throat culture test). Results from a rapid strep test are usually ready in a few minutes, but throat culture test results are available after one or two days. How is this treated? This condition is treated with antibiotic medicine. Follow these instructions at home: Medicines  Take over-the-counter and prescription medicines only as told by your health care  provider.  Take your antibiotic as told by your health care provider. Do not stop taking the antibiotic even if you start to feel better.  Have family members who also have a sore throat or fever tested for strep throat. They may need antibiotics if they have the strep infection. Eating and drinking  Do not share food, drinking cups, or personal items that could cause the infection to spread to other people.  If swallowing is difficult, try eating soft foods until your sore throat feels better.  Drink enough fluid to keep your urine clear or pale yellow. General instructions  Gargle with a salt-water mixture 3-4 times per day or as needed. To make a salt-water mixture, completely dissolve -1 tsp of salt in 1 cup of warm water.  Make sure that all household members wash their hands well.  Get plenty of rest.  Stay home from school or work until you have been taking antibiotics for 24 hours.  Keep all follow-up visits as told by your health care provider. This is important. Contact a health care provider if:  The glands in your neck continue to get bigger.  You develop a rash, cough, or earache.  You cough up a thick liquid that is green, yellow-brown, or bloody.  You have pain or discomfort that does not get better with medicine.  Your problems seem to be getting worse rather than better.  You have a fever. Get help right away if:  You have new symptoms, such as vomiting, severe headache, stiff  or painful neck, chest pain, or shortness of breath.  You have severe throat pain, drooling, or changes in your voice.  You have swelling of the neck, or the skin on the neck becomes red and tender.  You have signs of dehydration, such as fatigue, dry mouth, and decreased urination.  You become increasingly sleepy, or you cannot wake up completely.  Your joints become red or painful. This information is not intended to replace advice given to you by your health care provider.  Make sure you discuss any questions you have with your health care provider. Document Released: 04/22/2000 Document Revised: 12/23/2015 Document Reviewed: 08/18/2014 Elsevier Interactive Patient Education  [employer].

## 2018-04-23 NOTE — Progress Notes (Signed)
Subjective:    Patient ID: Kelly [name], female    DOB: 1940-11-02, 77 y.o.   MRN: [id number]  CC: Kelly [name] is a 77 y.o. female who presents today for an acute visit.    HPI: CC: congestion, 4 days, worsening.  Accompanied by daughter today Dry cough, sore throat.  Tactile warmth. Sore throat. NO ear pain, facial pressure.  NO vision changes, HA. Tried zyrtec.   DM- hasnt taken medications  Fasting Blood sugar today 202 prior to lantus.   CKD.- CrCl 28  HISTORY:  Past Medical History:  Diagnosis Date  . Colon polyps   . Depression   . DM (diabetes mellitus), type 2 with renal complications ([place])   . Hyperlipidemia   . Hypertension   . Kidney stones   . Sleep apnea   . 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    Past Surgical History:  Procedure Laterality Date  . APPENDECTOMY    . cataract  03/2017  . CHOLECYSTECTOMY    . COLONOSCOPY    . COLONOSCOPY WITH PROPOFOL N/A 08/22/2017   Procedure: COLONOSCOPY WITH PROPOFOL;  Surgeon: Jonathon Bellows, MD;  Location: [facility] ENDOSCOPY;  Service: Gastroenterology;  Laterality: N/A;  . ESOPHAGOGASTRODUODENOSCOPY    . ESOPHAGOGASTRODUODENOSCOPY (EGD) WITH PROPOFOL N/A 03/23/2016   Procedure: ESOPHAGOGASTRODUODENOSCOPY (EGD) WITH PROPOFOL;  Surgeon: Manya Silvas, MD;  Location[facility] ENDOSCOPY;  Service: Endoscopy;  Laterality: N/A;  . FLEXIBLE SIGMOIDOSCOPY N/A 02/06/2018   Procedure: FLEXIBLE SIGMOIDOSCOPY;  Surgeon: Jonathon Bellows, MD;  Location: [facility] ENDOSCOPY;  Service: Gastroenterology;  Laterality: N/A;  . RECTAL PROLAPSE REPAIR  2012   x2   Family History  Problem Relation Age of Onset  . Stroke Mother   . Arthritis Mother   . Aortic aneurysm Sister   . Lung cancer Sister   . Heart attack Father 75  . Breast cancer Sister 61    Allergies: Prilosec otc [omeprazole magnesium]; Sucralfate; Amoxicillin; and Morphine and related Current Outpatient Medications on File Prior to Visit    Medication Sig Dispense Refill  . ACCU-CHEK AVIVA PLUS test strip TEST UP TO 4 TIMES PER DAY 100 each 0  . ALPRAZolam (XANAX) 0.5 MG tablet Take 0.5 tablets (0.25 mg total) by mouth 2 (two) times daily as needed for anxiety. 30 tablet 0  . amLODipine (NORVASC) 2.5 MG tablet Take 1 tablet (2.5 mg total) by mouth daily. 90 tablet 3  . aspirin EC 81 MG tablet Take 81 mg by mouth daily.    Marland Kitchen atorvastatin (LIPITOR) 10 MG tablet TAKE 1 TABLET DAILY 90 tablet 1  . cyclobenzaprine (FLEXERIL) 10 MG tablet Take 0.5 tablets (5 mg total) by mouth 3 (three) times daily as needed for muscle spasms. 45 tablet 0  . furosemide (LASIX) 40 MG tablet Take 1 tablet (40 mg total) by mouth daily as needed. 90 tablet 0  . HYDROcodone-acetaminophen (NORCO) 5-325 MG tablet Norco 5 mg-325 mg tablet  Take 1 tablet twice a day by oral route as needed.    . Insulin Glargine (BASAGLAR KWIKPEN) 100 UNIT/ML SOPN Inject 0.08 mLs (8 Units total) into the skin at bedtime. 15 mL 0  . Insulin Pen Needle (BD PEN NEEDLE NANO U/F) 32G X 4 MM MISC Use daily with victoza and basaglar 200 each 2  . Lancets MISC Use up to 4 times daily to check blood sugars. 200 each 11  . liraglutide (VICTOZA) 18 MG/3ML SOPN Inject 0.3 mLs (1.8 mg total)  into the skin daily. 27 mL 3  . losartan (COZAAR) 50 MG tablet Take 1 tablet (50 mg total) by mouth daily. 90 tablet 1  . PARoxetine (PAXIL-CR) 25 MG 24 hr tablet Take 1 tablet (25 mg total) by mouth daily. 90 tablet 3  . potassium chloride (K-DUR) 10 MEQ tablet potassium chloride ER 10 mEq tablet,extended release     No current facility-administered medications on file prior to visit.     Social History   Tobacco Use  . Smoking status: Never Smoker  . Smokeless tobacco: Never Used  Substance Use Topics  . Alcohol use: No  . Drug use: No    Review of Systems  Constitutional: Negative for chills and fever.  HENT: Positive for congestion and sore throat. Negative for ear discharge, ear pain  and trouble swallowing.   Respiratory: Positive for cough. Negative for shortness of breath and wheezing.   Cardiovascular: Negative for chest pain and palpitations.  Gastrointestinal: Negative for nausea and vomiting.      Objective:    BP (!) 108/58 (BP Location: Left Arm, Patient Position: Sitting, Cuff Size: Normal)   Pulse 91   Temp 97.9 F (36.6 C)   Wt 149 lb 9.6 oz (67.9 kg)   SpO2 96%   BMI 25.68 kg/m    Physical Exam Vitals signs reviewed.  Constitutional:      Appearance: She is well-developed.  HENT:     Head: Normocephalic and atraumatic.     Right Ear: Hearing, tympanic membrane, ear canal and external ear normal. No decreased hearing noted. No drainage, swelling or tenderness. No middle ear effusion. No foreign body. Tympanic membrane is not erythematous or bulging.     Left Ear: Hearing, tympanic membrane, ear canal and external ear normal. No decreased hearing noted. No drainage, swelling or tenderness.  No middle ear effusion. No foreign body. Tympanic membrane is not erythematous or bulging.     Nose: Nose normal. No rhinorrhea.     Right Sinus: No maxillary sinus tenderness or frontal sinus tenderness.     Left Sinus: No maxillary sinus tenderness or frontal sinus tenderness.     Mouth/Throat:     Pharynx: Uvula midline. Posterior oropharyngeal erythema present. No oropharyngeal exudate.     Tonsils: No tonsillar abscesses.  Eyes:     Conjunctiva/sclera: Conjunctivae normal.  Cardiovascular:     Rate and Rhythm: Regular rhythm.     Pulses: Normal pulses.     Heart sounds: Normal heart sounds.  Pulmonary:     Effort: Pulmonary effort is normal.     Breath sounds: Normal breath sounds. No wheezing, rhonchi or rales.  Lymphadenopathy:     Head:     Right side of head: No submental, submandibular, tonsillar, preauricular, posterior auricular or occipital adenopathy.     Left side of head: No submental, submandibular, tonsillar, preauricular, posterior  auricular or occipital adenopathy.     Cervical: No cervical adenopathy.  Skin:    General: Skin is warm and dry.  Neurological:     Mental Status: She is alert.  Psychiatric:        Speech: Speech normal.        Behavior: Behavior normal.        Thought Content: Thought content normal.        Assessment & Plan:   1.  Strep pharyngitis Positive strep, negative influenza. Patient non toxic in appearance. start Z-Pak.  No renally dosing required based on creatinine clearance.  She will  let me know if not better.  - POCT Influenza A/B - POCT rapid strep A - azithromycin (ZITHROMAX) 250 MG tablet; Tale 500 mg PO on day 1, then 250 mg PO q24h x 4 days.  Dispense: 6 tablet; Refill: 0    I am having Syla G. Henton start on azithromycin. I am also having her maintain her aspirin EC, Lancets, furosemide, PARoxetine, amLODipine, potassium chloride, HYDROcodone-acetaminophen, ALPRAZolam, liraglutide, Insulin Pen Needle, atorvastatin, losartan, ACCU-CHEK AVIVA PLUS, cyclobenzaprine, and BASAGLAR KWIKPEN.   Meds ordered this encounter  Medications  . azithromycin (ZITHROMAX) 250 MG tablet    Sig: Tale 500 mg PO on day 1, then 250 mg PO q24h x 4 days.    Dispense:  6 tablet    Refill:  0    Order Specific Question:   Supervising Provider    Answer:   Crecencio Mc [[id number]]    Return precautions given.   Risks, benefits, and alternatives of the medications and treatment plan prescribed today were discussed, and patient expressed understanding.   Education regarding symptom management and diagnosis given to patient on AVS.  Continue to follow with [name], MD for routine health maintenance.   Heli Lovenia Shuck and I agreed with plan.   Mable Paris, FNP

## 2018-04-24 ENCOUNTER — Ambulatory Visit: Payer: Self-pay | Admitting: *Deleted

## 2018-04-24 NOTE — Telephone Encounter (Signed)
Sent to Philis Nettle please advise.   Thanks

## 2018-04-24 NOTE — Telephone Encounter (Signed)
Yes she can take zyrtec with azithromycin and mucinex

## 2018-04-24 NOTE — Telephone Encounter (Signed)
Message from Sheran Luz sent at 04/24/2018 12:07 PM EST   Summary: Med question.    Patient states she was prescribed azithromycin (ZITHROMAX) 250 MG tablet and mucinex yesterday- Patient would like to know if she should continue to take Zyrtec while on these medications. Please advise.           Reason for Disposition . [1] Follow-up call from patient regarding patient's clinical status AND [2] information urgent  Answer Assessment - Initial Assessment Questions 1. REASON FOR CALL or QUESTION: "What is your reason for calling today?" or "How can I best help you?" or "What question do you have that I can help answer?"     Pt wanting to know if she should take Zyrtec with the prescriptions that were given on yesterday 2. CALLER: Document the source of call. (e.g., laboratory, patient).     patient  Protocols used: PCP CALL - NO TRIAGE-A-AH

## 2018-04-25 NOTE — Telephone Encounter (Signed)
Called and spoke with patient. Pt advised and voiced understanding.  

## 2018-05-14 ENCOUNTER — Encounter: Payer: Self-pay | Admitting: Family Medicine

## 2018-05-14 ENCOUNTER — Ambulatory Visit (INDEPENDENT_AMBULATORY_CARE_PROVIDER_SITE_OTHER): Payer: Medicare Other | Admitting: Family Medicine

## 2018-05-14 VITALS — BP 140/76 | HR 90 | Temp 98.1°F | Ht 64.0 in | Wt 148.8 lb

## 2018-05-14 DIAGNOSIS — N644 Mastodynia: Secondary | ICD-10-CM | POA: Diagnosis not present

## 2018-05-14 NOTE — Progress Notes (Signed)
Subjective:    Patient ID: Kelly [name], female    DOB: Mar 09, 1941, 78 y.o.   MRN: [id number]  HPI   Patient resents to clinic due to feelings of pain and a possible lump on the right breast.  Patient states she noticed to about a week ago when she was doing breast exam.  Denies any nipple changes, or discharge from nipples.  Patient has never had abnormal mammograms.  Last mammogram from April 2019 reviewed, and results were negative with plan for 1 year annual follow-up screening.  Patient states her sister did have breast cancer in her early 59s.  Patient Active Problem List   Diagnosis Date Noted  . Cat bite of left hand 03/13/2018  . Muscle cramps 02/12/2018  . Numbness and tingling of left thumb 10/25/2017  . Rectal lesion 09/07/2017  . Height loss 08/15/2017  . 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 Cervical spondylosis without myelopathy 08/15/2017  . DDD (degenerative disc disease), cervical 08/15/2017  . Bursitis of hip 08/15/2017  . Female stress incontinence 08/15/2017  . Muscle weakness 08/15/2017  . Neck pain 08/15/2017  . Spondylolisthesis, congenital 08/15/2017  . Radial styloid tenosynovitis 08/15/2017  . Senile osteoporosis 08/15/2017  . Shoulder joint pain 08/15/2017  . Anxiety and depression 07/25/2017  . Rectal bleeding 07/25/2017  . Essential tremor 04/26/2017  . Other fatigue 03/22/2017  . Left ventricular hypertrophy 10/12/2016  . CKD (chronic kidney disease) stage 4, GFR 15-29 ml/min ([facility]) 08/26/2016  . OSA on CPAP 08/26/2016  . DM (diabetes mellitus), type 2 with renal complications ([place])   . Hyperlipidemia   . Hypertension    Social History   Tobacco Use  . Smoking status: Never Smoker  . Smokeless tobacco: Never Used  Substance Use Topics  . Alcohol use: No   Review of Systems   Constitutional: Negative for chills, fatigue and fever.  HENT: Negative for congestion, ear pain, sinus pain and sore throat.   Eyes: Negative.     Respiratory: Negative for cough, shortness of breath and wheezing.   Cardiovascular: Negative for chest pain, palpitations and leg swelling.  Breast: pain/lump right breast Gastrointestinal: Negative for abdominal pain, diarrhea, nausea and vomiting.  Genitourinary: Negative for dysuria, frequency and urgency.  Musculoskeletal: Negative for arthralgias and myalgias.  Skin: Negative for color change, pallor and rash.  Neurological: Negative for syncope, light-headedness and headaches.  Psychiatric/Behavioral: The patient is not nervous/anxious.       Objective:   Physical Exam Vitals signs and nursing note reviewed.  Constitutional:      General: She is not in acute distress.    Appearance: Normal appearance.  HENT:     Head: Normocephalic and atraumatic.  Eyes:     General: No scleral icterus.    Extraocular Movements: Extraocular movements intact.     Conjunctiva/sclera: Conjunctivae normal.  Cardiovascular:     Rate and Rhythm: Normal rate and regular rhythm.  Pulmonary:     Effort: Pulmonary effort is normal. No respiratory distress.     Breath sounds: Normal breath sounds.  Chest:     Breasts:        Right: Tenderness present. No swelling, bleeding, inverted nipple, mass, nipple discharge or skin change.        Left: Normal. No swelling, bleeding, inverted nipple, mass, nipple discharge, skin change or tenderness.       Comments: Area of tenderness indicated by red circle on diagram.  I do not palpate any obvious mass or lump  in this location. Skin:    General: Skin is warm and dry.     Coloration: Skin is not pale.  Neurological:     Mental Status: She is alert and oriented to person, place, and time.  Psychiatric:        Mood and Affect: Mood normal.        Behavior: Behavior normal.     Vitals:   05/14/18 1305  BP: 140/76  Pulse: 90  Temp: 98.1 F (36.7 C)  SpO2: 96%    Assessment & Plan:   Breast pain, R - we will get diagnostic mammogram of right  breast to further investigate cause of pain.  Patient reassured that her screening mammogram from April was negative so this is good.  Diagnostic mammogram will help confirm if any abnormal breast etiology is occurring.  Patient is aware someone will contact her in regards to mammogram.  She otherwise will keep regularly scheduled follow-up with PCP as planned.

## 2018-05-21 ENCOUNTER — Ambulatory Visit (INDEPENDENT_AMBULATORY_CARE_PROVIDER_SITE_OTHER): Payer: Medicare Other | Admitting: Pharmacist

## 2018-05-21 ENCOUNTER — Encounter: Payer: Self-pay | Admitting: Pharmacist

## 2018-05-21 ENCOUNTER — Ambulatory Visit: Payer: Medicare Other | Admitting: Pharmacist

## 2018-05-21 DIAGNOSIS — E1140 Type 2 diabetes mellitus with diabetic neuropathy, unspecified: Secondary | ICD-10-CM | POA: Diagnosis not present

## 2018-05-21 DIAGNOSIS — E1122 Type 2 diabetes mellitus with diabetic chronic kidney disease: Secondary | ICD-10-CM

## 2018-05-21 DIAGNOSIS — N184 Chronic kidney disease, stage 4 (severe): Secondary | ICD-10-CM

## 2018-05-21 DIAGNOSIS — Z794 Long term (current) use of insulin: Secondary | ICD-10-CM | POA: Diagnosis not present

## 2018-05-21 DIAGNOSIS — N189 Chronic kidney disease, unspecified: Secondary | ICD-10-CM | POA: Diagnosis not present

## 2018-05-21 DIAGNOSIS — G4733 Obstructive sleep apnea (adult) (pediatric): Secondary | ICD-10-CM | POA: Diagnosis not present

## 2018-05-21 LAB — POCT GLYCOSYLATED HEMOGLOBIN (HGB A1C): Hemoglobin A1C: 8.3 % — AB (ref 4.0–5.6)

## 2018-05-21 MED ORDER — BASAGLAR KWIKPEN 100 UNIT/ML ~~LOC~~ SOPN: 20.0000 [IU] | PEN_INJECTOR | Freq: Every day | SUBCUTANEOUS | 1 refills | Status: DC

## 2018-05-21 NOTE — Assessment & Plan Note (Signed)
#  Diabetes - Currently uncontrolled; most recent A1c 8.3% today is improved from A1c 8.9% on 02/12/2018; Goal A1c <8%. Patient continues to make gradual improvements in BG results, but A1c control is limited by high carbohydrate dietary choices.  - Increase Basaglar to 20 units daily. Continue Victoza 1.8 mg once daily - Provided chart to reinforce checking fasting and 2 hour post prandial blood sugars, alternating between after lunch and after supper.  - Discussed cutting back on sodas/juices.

## 2018-05-21 NOTE — Patient Instructions (Signed)
It was great to see you today!   We are going to make one change:  1) Increase Basaglar (insulin glargine) to 20 units once daily.  2) Continue Victoza 1.8 mg once daily.   Keep checking blood sugars twice daily. When you check first thing in the morning, we would like to see readings all less than 150. When you check 2 hours after a meal, we would like to see readings less than 180.   Date Before Breakfast 2 hours After Lunch 2 hours After Supper                                                                                                                                            Try incorporating protein into your breakfasts. Things like eggs, greek yogurt, meats are high in protein. I personally like Community education officer.   Schedule follow up with me in 4-5 weeks. Please reach out with any questions!   [name], PharmD

## 2018-05-21 NOTE — Progress Notes (Signed)
S:     Chief Complaint  Patient presents with  . Medication Management    Diabetes    Patient arrives in good spirits, ambulating without assistance and accompanied by her daughter. Presents for diabetes evaluation, education, and management at the request of Dr. Caryl Bis on last A1c check on 02/12/2018. She was last seen in Pharmacy Clinic on 04/02/2018; at that time Basaglar was increased.  Today, she notes that she knows she needs to do better with diet. We had previously discussed decreasing from 2 sodas/day; today she notes that she is still drinking that much, if not more. She endorses adherence to medications. We discussed the impact that uncontrolled blood sugars has on microvascular complications, include renal function and possible progression to dialysis.   Insurance coverage/medication affordability: BCBS Medicare  Patient reports adherence with medications.  Current diabetes medications include: Basaglar 16 units daily, Victoza 1.8 mg daily Current hypertension medications include: amlodipine 2.5 mg daily, losartan 50 mg daily  Patient denies hypoglycemic s/sx including dizziness, shakiness, sweating. Patient reports increase in hyperglycemic symptoms including polyuria, nocturia, polydipsia, polyphagia. Reports some incontinence. Patient reports self foot exams.   Patient reported dietary habits: Eats 2 meals/day and a snack Breakfast: Peanut butter sandwich w/ 1 pieces of bread; juices;  Lunch: Fish sandwich when out; at home has been eating grapes, apples, oranges; chicken salad on bread; pimento cheese;  Dinner: Spaghetti; mushrooms and carrots for supper; tries to eat some fresh vegetables Snacks: Not much of a snacker; occasionally cheese and crackers  Drinks: Water; drinking ~2 sodas/day   Patient reported exercise habits: Has not been walking the dog much lately d/t weather, strep throat   O:   Physical Exam Vitals signs reviewed.  Constitutional:    Appearance: She is well-developed.    Review of Systems  All other systems reviewed and are negative.   Lab Results  Component Value Date   HGBA1C 8.3 (A) 05/21/2018   Vitals:   05/21/18 1632  BP: 121/73  Pulse: 80    Basic Metabolic Panel BMP Latest Ref Rng & Units 02/12/2018 11/02/2017 03/22/2017  Glucose 70 - 99 mg/dL 224(H) 408(H) 208(H)  BUN 6 - 23 mg/dL 22 37(H) 27(H)  Creatinine 0.40 - 1.20 mg/dL 1.84(H) 2.30(H) 2.24(H)  Sodium 135 - 145 mEq/L 140 137 142  Potassium 3.5 - 5.1 mEq/L 4.9 5.1 3.7  Chloride 96 - 112 mEq/L 103 103 99  CO2 19 - 32 mEq/L 33(H) 29 35(H)  Calcium 8.4 - 10.5 mg/dL 9.2 8.9 9.6    Lipid Panel     Component Value Date/Time   CHOL 133 08/02/2017 1022   TRIG 131.0 08/02/2017 1022   HDL 30.30 (L) 08/02/2017 1022   CHOLHDL 4 08/02/2017 1022   VLDL 26.2 08/02/2017 1022   LDLCALC 77 08/02/2017 1022    SMBG:  7 day average 153 14 day average; 173   Clinical ASCVD: No  ASCVD risk factors: age, CKD,  The 10-year ASCVD risk score Mikey Bussing DC Jr., et al., 2013) is: 35.1%   Values used to calculate the score:     Age: 78 years     Sex: Female     Is Non-Hispanic African American: No     Diabetic: Yes     Tobacco smoker: No     Systolic Blood Pressure: 119 mmHg     Is BP treated: Yes     HDL Cholesterol: 30.3 mg/dL     Total Cholesterol: 133 mg/dL  A/P: Following discussion and approval by Dr. Caryl Bis, the following medication changes were made:   #Diabetes - Currently uncontrolled; most recent A1c 8.3% today is improved from A1c 8.9% on 02/12/2018; Goal A1c <8%. Patient continues to make gradual improvements in BG results, but A1c control is limited by high carbohydrate dietary choices.  - Increase Basaglar to 20 units daily. Continue Victoza 1.8 mg once daily - Provided chart to reinforce checking fasting and 2 hour post prandial blood sugars, alternating between after lunch and after supper.  - Discussed cutting back on sodas/juices.    #Hypertension currently controlled. Goal BP <140/90 - Continue amlodipine 2.5 mg daily and losartan 50 mg daily  #ASCVD risk - primary prevention in patient aged 77-75 with DM; 10 year ASCVD risk >20%, however, patient is >1 years of age; moderate intensity statin appropriate - Continued aspirin 81 mg daily. Could consider discontinuation if bleed risk determined to be elevated moving forward - Continue atorvastatin 10 mg daily  Written patient instructions provided.  Total time in face to face counseling 30 minutes.    Follow up in Pharmacist Clinic Visit 4 weeks. Patient seen with Denver Faster, PharmD candidate.   [name], PharmD PGY2 Ambulatory Care Pharmacy Resident Phone: [phone]

## 2018-05-24 ENCOUNTER — Other Ambulatory Visit: Payer: Self-pay | Admitting: Family Medicine

## 2018-05-24 DIAGNOSIS — R252 Cramp and spasm: Secondary | ICD-10-CM

## 2018-05-24 DIAGNOSIS — E785 Hyperlipidemia, unspecified: Secondary | ICD-10-CM

## 2018-05-24 NOTE — Progress Notes (Signed)
I have reviewed the above note and agree. I saw the patient with the clinical pharmacist.  Alissah Redmon, MD  

## 2018-05-24 NOTE — Telephone Encounter (Signed)
Sent to PCP for approval for controled.   Last OV 05/14/2018  Last refilled 04/03/2018 disp 45 with no refills   Sent to PCP to advise

## 2018-05-25 DIAGNOSIS — D0461 Carcinoma in situ of skin of right upper limb, including shoulder: Secondary | ICD-10-CM | POA: Diagnosis not present

## 2018-05-28 ENCOUNTER — Other Ambulatory Visit: Payer: Self-pay | Admitting: Family Medicine

## 2018-05-28 ENCOUNTER — Telehealth: Payer: Self-pay

## 2018-05-28 DIAGNOSIS — N644 Mastodynia: Secondary | ICD-10-CM

## 2018-05-28 NOTE — Telephone Encounter (Signed)
Copied from [place]. Topic: Referral - Status >> May 28, 2018  2:03 PM Kelly [name] R wrote: Patient states her mammogram referral needs to be sent to [facility].

## 2018-05-28 NOTE — Telephone Encounter (Signed)
Copied from [place]. Topic: Referral - Status >> May 28, 2018  2:03 PM Alfredia Ferguson R wrote: Patient states her mammogram referral needs to be sent to [facility].

## 2018-05-29 NOTE — Telephone Encounter (Signed)
Was this sent there? Sent to PCP?

## 2018-05-29 NOTE — Telephone Encounter (Signed)
It appears this is scheduled for 06/01/2018.  It appears this was scheduled by the patient yesterday.

## 2018-06-01 ENCOUNTER — Ambulatory Visit
Admission: RE | Admit: 2018-06-01 | Discharge: 2018-06-01 | Disposition: A | Discharge status: Home / Self Care | Payer: Medicare Other | Source: Ambulatory Visit | Attending: Family Medicine | Admitting: Family Medicine

## 2018-06-01 DIAGNOSIS — N644 Mastodynia: Secondary | ICD-10-CM | POA: Diagnosis not present

## 2018-06-01 DIAGNOSIS — R922 Inconclusive mammogram: Secondary | ICD-10-CM | POA: Diagnosis not present

## 2018-06-01 DIAGNOSIS — N6489 Other specified disorders of breast: Secondary | ICD-10-CM | POA: Diagnosis not present

## 2018-06-04 ENCOUNTER — Other Ambulatory Visit: Payer: Self-pay | Admitting: Family Medicine

## 2018-06-04 DIAGNOSIS — R928 Other abnormal and inconclusive findings on diagnostic imaging of breast: Secondary | ICD-10-CM

## 2018-06-05 ENCOUNTER — Telehealth: Payer: Self-pay | Admitting: Pharmacist

## 2018-06-05 DIAGNOSIS — M5136 Other intervertebral disc degeneration, lumbar region: Secondary | ICD-10-CM | POA: Diagnosis not present

## 2018-06-05 DIAGNOSIS — Z79899 Other long term (current) drug therapy: Secondary | ICD-10-CM | POA: Diagnosis not present

## 2018-06-05 DIAGNOSIS — E1165 Type 2 diabetes mellitus with hyperglycemia: Secondary | ICD-10-CM | POA: Diagnosis not present

## 2018-06-05 DIAGNOSIS — Q762 Congenital spondylolisthesis: Secondary | ICD-10-CM | POA: Diagnosis not present

## 2018-06-05 DIAGNOSIS — M6281 Muscle weakness (generalized): Secondary | ICD-10-CM | POA: Diagnosis not present

## 2018-06-05 DIAGNOSIS — M7062 Trochanteric bursitis, left hip: Secondary | ICD-10-CM | POA: Diagnosis not present

## 2018-06-05 DIAGNOSIS — T2101XA Burn of unspecified degree of chest wall, initial encounter: Secondary | ICD-10-CM | POA: Diagnosis not present

## 2018-06-05 DIAGNOSIS — M810 Age-related osteoporosis without current pathological fracture: Secondary | ICD-10-CM | POA: Diagnosis not present

## 2018-06-05 DIAGNOSIS — M25572 Pain in left ankle and joints of left foot: Secondary | ICD-10-CM | POA: Diagnosis not present

## 2018-06-05 DIAGNOSIS — M545 Low back pain: Secondary | ICD-10-CM | POA: Diagnosis not present

## 2018-06-05 DIAGNOSIS — M5032 Other cervical disc degeneration, mid-cervical region, unspecified level: Secondary | ICD-10-CM | POA: Diagnosis not present

## 2018-06-05 DIAGNOSIS — M542 Cervicalgia: Secondary | ICD-10-CM | POA: Diagnosis not present

## 2018-06-05 NOTE — Telephone Encounter (Signed)
Received fax from Dr. Caryl Bis from [facility] regarding Accu-Chek test strip recall. Information noted that this patient might have received a recalled lot.   Contacted patient; she noted that she had received information about the recall, but the pharmacy had not reached out regarding her receiving an affected lot. In any recall, the dispensing pharmacy would reach out to patients who had received affected lots.   Additionally, she noted that her insurance plan has a new diabetes management program that is going to supply a new meter, test strips, and lancets for free for her. She will be receiving this in the next few days. This new shipment would not contain an affected lot. No action is needed at this time.   Will route to PCP for notification. Patient and I have an appointment scheduled 06/18/2018 for follow up on blood sugars.  [name], PharmD PGY2 Ambulatory Care Pharmacy Resident, [facility] Phone: [phone]

## 2018-06-10 ENCOUNTER — Encounter: Payer: Self-pay | Admitting: Family Medicine

## 2018-06-11 DIAGNOSIS — Z23 Encounter for immunization: Secondary | ICD-10-CM

## 2018-06-14 ENCOUNTER — Other Ambulatory Visit: Payer: Self-pay | Admitting: Family Medicine

## 2018-06-14 DIAGNOSIS — R252 Cramp and spasm: Secondary | ICD-10-CM

## 2018-06-15 ENCOUNTER — Ambulatory Visit: Payer: Medicare Other

## 2018-06-15 ENCOUNTER — Ambulatory Visit: Payer: Medicare Other | Admitting: Family Medicine

## 2018-06-15 ENCOUNTER — Telehealth: Payer: Self-pay | Admitting: Pharmacist

## 2018-06-15 NOTE — Telephone Encounter (Signed)
Contacted patient to reschedule appointment with me on 06/18/2018. Rescheduled for 11 am.   [name], PharmD, [place] PGY2 Ambulatory Care Pharmacy Resident, [place] Phone: [phone]

## 2018-06-18 ENCOUNTER — Ambulatory Visit (INDEPENDENT_AMBULATORY_CARE_PROVIDER_SITE_OTHER): Payer: Medicare Other | Admitting: Pharmacist

## 2018-06-18 ENCOUNTER — Ambulatory Visit: Payer: Medicare Other | Admitting: Pharmacist

## 2018-06-18 ENCOUNTER — Encounter: Payer: Self-pay | Admitting: Pharmacist

## 2018-06-18 DIAGNOSIS — E1122 Type 2 diabetes mellitus with diabetic chronic kidney disease: Secondary | ICD-10-CM

## 2018-06-18 DIAGNOSIS — E119 Type 2 diabetes mellitus without complications: Secondary | ICD-10-CM | POA: Diagnosis not present

## 2018-06-18 DIAGNOSIS — Z794 Long term (current) use of insulin: Secondary | ICD-10-CM

## 2018-06-18 DIAGNOSIS — M47812 Spondylosis without myelopathy or radiculopathy, cervical region: Secondary | ICD-10-CM | POA: Diagnosis not present

## 2018-06-18 DIAGNOSIS — N184 Chronic kidney disease, stage 4 (severe): Secondary | ICD-10-CM

## 2018-06-18 NOTE — Assessment & Plan Note (Signed)
#  Diabetes - Currently uncontrolled though improved per SMBG; most recent A1c 8.3% 05/21/2018 is improved from A1c 8.9% on 02/12/2018; Goal A1c <8%. Patient has been doing an incredible job monitoring blood sugars, in improvement to previously. Many nights, her high-carbohydrate meals indicate that mealtime insulin is required, however, there are other evenings that her post-prandials are <100. Need more data to determine exactly when patient would require mealtime insulin to prevent pushing her to hypoglycemia.  - Encouraged to continue to check 2 hour post supper and to record what she ate for the next 3 weeks - Continue Basaglar 20 units daily and Victoza 1.8 mg once daily - Increase Basaglar to 20 units daily. Continue Victoza 1.8 mg once daily

## 2018-06-18 NOTE — Patient Instructions (Signed)
It was great to see you today!  We are not going to make any medication changes today. Continue Basaglar 20 units once daily and Victoza 1.8 mg once daily.   Continue checking your blood sugar: 1) Fasting, before eating breakfast. The goal fasting blood sugar is less than 130 mg/dL 2) 2 hours after supper. The goal 2-hour after meal blood sugar is less than 180 mg/dL  Write down what you eat for supper. This will help [place] determine if/when you may need mealtime insulin with supper.   Please feel free to contact clinic with any questions or concerns. I'll see you in 3 weeks!  [name], PharmD

## 2018-06-18 NOTE — Progress Notes (Addendum)
S:     Chief Complaint  Patient presents with  . Medication Management    Diabetes    Patient arrives in good spirits, ambulating without assistance. Presents for diabetes evaluation, education, and management at the request of Dr. Caryl Bis on last A1c check on 02/12/2018. She was last seen in Pharmacy Clinic on 05/21/2018; at that time Basaglar was increased.  Today, she is extremely excited by the progress she has made.She received a new meter/strips from [facility], and has been checking fasting sugars daily, and has checked 2 hour post prandials at least 4 days a week. She has been keeping track of what she has been eating, and is noticing which sugars have resulted in extremely elevated post-prandial readings. There have been some evenings where her 2 hour post supper readings have been at goal, and have been been <100.   Insurance coverage/medication affordability: BCBS Medicare  Patient reports adherence with medications.  Current diabetes medications include: Basaglar 20 units QPM, Victoza 1.8 mg QAM Current hypertension medications include: amlodipine 2.5 mg daily, losartan 50 mg daily  Patient denies hypoglycemic s/sx including dizziness, shakiness, sweating. Patient reports decrease in hyperglycemic symptoms including polyuria, nocturia, polydipsia, polyphagia. Patient reports self foot exams.   Patient reported dietary habits: Eats 2 meals/day and a snack - Nights she eats out or eats 2 oranges after supper, she has higher post supper readings. However, a few post supper readings that are at goal <100  Patient reported exercise habits: Has not been walking the dog much lately d/t weather, strep throat   O:   Physical Exam Vitals signs reviewed.  Constitutional:      Appearance: She is well-developed.    Review of Systems  All other systems reviewed and are negative.   Lab Results  Component Value Date   HGBA1C 8.3 (A) 05/21/2018   Vitals:   06/18/18 1117  BP:  138/78  Pulse: 78    Basic Metabolic Panel BMP Latest Ref Rng & Units 02/12/2018 11/02/2017 03/22/2017  Glucose 70 - 99 mg/dL 224(H) 408(H) 208(H)  BUN 6 - 23 mg/dL 22 37(H) 27(H)  Creatinine 0.40 - 1.20 mg/dL 1.84(H) 2.30(H) 2.24(H)  Sodium 135 - 145 mEq/L 140 137 142  Potassium 3.5 - 5.1 mEq/L 4.9 5.1 3.7  Chloride 96 - 112 mEq/L 103 103 99  CO2 19 - 32 mEq/L 33(H) 29 35(H)  Calcium 8.4 - 10.5 mg/dL 9.2 8.9 9.6    Lipid Panel     Component Value Date/Time   CHOL 133 08/02/2017 1022   TRIG 131.0 08/02/2017 1022   HDL 30.30 (L) 08/02/2017 1022   CHOLHDL 4 08/02/2017 1022   VLDL 26.2 08/02/2017 1022   LDLCALC 77 08/02/2017 1022    SMBG:  7 day average 139 14 day average; 150 Average fasting: 123 Post prandials: 132, 372 (s/p fried shrimp and fries, 2-3 oranges after supper), 170, 97, 139, 221, 88, 206, 120   Clinical ASCVD: No  ASCVD risk factors: age, CKD,  The 10-year ASCVD risk score Mikey Bussing DC Jr., et al., 2013) is: 46.5%   Values used to calculate the score:     Age: 78 years     Sex: Female     Is Non-Hispanic African American: No     Diabetic: Yes     Tobacco smoker: No     Systolic Blood Pressure: 824 mmHg     Is BP treated: Yes     HDL Cholesterol: 30.3 mg/dL  Total Cholesterol: 133 mg/dL    A/P: #Diabetes - Currently uncontrolled though improved per SMBG; most recent A1c 8.3% 05/21/2018 is improved from A1c 8.9% on 02/12/2018; Goal A1c <8%. Patient has been doing an incredible job monitoring blood sugars, in improvement to previously. Many nights, her high-carbohydrate meals indicate that mealtime insulin is required, however, there are other evenings that her post-prandials are <100. Need more data to determine exactly when patient would require mealtime insulin to prevent pushing her to hypoglycemia.  - Encouraged to continue to check 2 hour post supper and to record what she ate for the next 3 weeks - Continue Basaglar 20 units daily and Victoza 1.8 mg  once daily  #Hypertension currently controlled. Goal BP <140/90 - Continue amlodipine 2.5 mg daily and losartan 50 mg daily  #ASCVD risk - primary prevention in patient aged 35-75 with DM; 10 year ASCVD risk >20%, however, patient is >65 years of age; moderate intensity statin appropriate - Continued aspirin 81 mg daily. Could consider discontinuation if bleed risk determined to be elevated moving forward - Continue atorvastatin 10 mg daily  Written patient instructions provided.  Total time in face to face counseling 30 minutes.    Follow up in Pharmacist Clinic Visit 3 weeks.  [name], PharmD, [place] PGY2 Ambulatory Care Pharmacy Resident Phone: [phone]

## 2018-06-19 ENCOUNTER — Ambulatory Visit
Admission: RE | Admit: 2018-06-19 | Discharge: 2018-06-19 | Disposition: A | Discharge status: Home / Self Care | Payer: Medicare Other | Source: Ambulatory Visit | Attending: Family Medicine | Admitting: Family Medicine

## 2018-06-19 DIAGNOSIS — Z1231 Encounter for screening mammogram for malignant neoplasm of breast: Secondary | ICD-10-CM | POA: Diagnosis not present

## 2018-06-19 DIAGNOSIS — R928 Other abnormal and inconclusive findings on diagnostic imaging of breast: Secondary | ICD-10-CM | POA: Diagnosis not present

## 2018-06-19 HISTORY — PX: BREAST BIOPSY: SHX20

## 2018-06-20 LAB — SURGICAL PATHOLOGY

## 2018-06-21 NOTE — Progress Notes (Signed)
I have reviewed the above note and agree. I was available to the pharmacist for consultation.  Tommi Rumps, MD

## 2018-06-22 DIAGNOSIS — M818 Other osteoporosis without current pathological fracture: Secondary | ICD-10-CM | POA: Diagnosis not present

## 2018-06-27 ENCOUNTER — Other Ambulatory Visit: Payer: Self-pay | Admitting: Family Medicine

## 2018-06-27 DIAGNOSIS — R252 Cramp and spasm: Secondary | ICD-10-CM

## 2018-06-28 DIAGNOSIS — Q762 Congenital spondylolisthesis: Secondary | ICD-10-CM | POA: Diagnosis not present

## 2018-06-28 DIAGNOSIS — E1165 Type 2 diabetes mellitus with hyperglycemia: Secondary | ICD-10-CM | POA: Diagnosis not present

## 2018-06-28 DIAGNOSIS — R52 Pain, unspecified: Secondary | ICD-10-CM | POA: Diagnosis not present

## 2018-06-28 DIAGNOSIS — M7062 Trochanteric bursitis, left hip: Secondary | ICD-10-CM | POA: Diagnosis not present

## 2018-06-28 DIAGNOSIS — M5032 Other cervical disc degeneration, mid-cervical region, unspecified level: Secondary | ICD-10-CM | POA: Diagnosis not present

## 2018-06-28 DIAGNOSIS — M545 Low back pain: Secondary | ICD-10-CM | POA: Diagnosis not present

## 2018-06-28 DIAGNOSIS — M6281 Muscle weakness (generalized): Secondary | ICD-10-CM | POA: Diagnosis not present

## 2018-06-28 DIAGNOSIS — M25519 Pain in unspecified shoulder: Secondary | ICD-10-CM | POA: Diagnosis not present

## 2018-06-28 DIAGNOSIS — M542 Cervicalgia: Secondary | ICD-10-CM | POA: Diagnosis not present

## 2018-06-28 DIAGNOSIS — M5136 Other intervertebral disc degeneration, lumbar region: Secondary | ICD-10-CM | POA: Diagnosis not present

## 2018-06-28 DIAGNOSIS — M25572 Pain in left ankle and joints of left foot: Secondary | ICD-10-CM | POA: Diagnosis not present

## 2018-06-28 DIAGNOSIS — T2101XA Burn of unspecified degree of chest wall, initial encounter: Secondary | ICD-10-CM | POA: Diagnosis not present

## 2018-07-05 DIAGNOSIS — I10 Essential (primary) hypertension: Secondary | ICD-10-CM | POA: Diagnosis not present

## 2018-07-05 DIAGNOSIS — E1129 Type 2 diabetes mellitus with other diabetic kidney complication: Secondary | ICD-10-CM | POA: Diagnosis not present

## 2018-07-05 DIAGNOSIS — N184 Chronic kidney disease, stage 4 (severe): Secondary | ICD-10-CM | POA: Diagnosis not present

## 2018-07-05 LAB — HEPATIC FUNCTION PANEL
ALT: 57 — AB (ref 7–35)
AST: 32 (ref 13–35)
Alkaline Phosphatase: 125 (ref 25–125)
Bilirubin, Total: 0.3

## 2018-07-05 LAB — BASIC METABOLIC PANEL
BUN: 35 — AB (ref 4–21)
Creatinine: 1.8 — AB (ref <=1.1)
Glucose: 140
Potassium: 3.8 (ref 3.4–5.3)
Sodium: 144 (ref 137–147)

## 2018-07-05 LAB — CBC AND DIFFERENTIAL
HCT: 43 (ref 36–46)
Hemoglobin: 14.3 (ref 12.0–16.0)
WBC: 5.4

## 2018-07-09 ENCOUNTER — Encounter: Payer: Self-pay | Admitting: Pharmacist

## 2018-07-09 ENCOUNTER — Ambulatory Visit (INDEPENDENT_AMBULATORY_CARE_PROVIDER_SITE_OTHER): Payer: Medicare Other | Admitting: Pharmacist

## 2018-07-09 DIAGNOSIS — I129 Hypertensive chronic kidney disease with stage 1 through stage 4 chronic kidney disease, or unspecified chronic kidney disease: Secondary | ICD-10-CM | POA: Diagnosis not present

## 2018-07-09 DIAGNOSIS — R600 Localized edema: Secondary | ICD-10-CM | POA: Diagnosis not present

## 2018-07-09 DIAGNOSIS — E1122 Type 2 diabetes mellitus with diabetic chronic kidney disease: Secondary | ICD-10-CM | POA: Diagnosis not present

## 2018-07-09 DIAGNOSIS — N184 Chronic kidney disease, stage 4 (severe): Secondary | ICD-10-CM | POA: Diagnosis not present

## 2018-07-09 DIAGNOSIS — I10 Essential (primary) hypertension: Secondary | ICD-10-CM | POA: Diagnosis not present

## 2018-07-09 DIAGNOSIS — E1129 Type 2 diabetes mellitus with other diabetic kidney complication: Secondary | ICD-10-CM | POA: Diagnosis not present

## 2018-07-09 DIAGNOSIS — Z794 Long term (current) use of insulin: Secondary | ICD-10-CM

## 2018-07-09 NOTE — Progress Notes (Addendum)
S:     Chief Complaint  Patient presents with  . Medication Management    Diabetes    Patient arrives in good spirits, ambulating without assistance and with her daughter. Presents for diabetes evaluation, education, and management at the request of Dr. Caryl Bis on last A1c check on 02/12/2018. She was last seen in Pharmacy Clinic on 06/18/2018; at that time, no medication changes were made and patient was asked to check more post-prandial BG.  Today, she brings a log of fasting, post-prandial glucoses and meals. Her daughter notes that the post-supper elevations that are the highest occur when she has a Coke, which is multiple times a week.   She notes that she saw nephrology today who also recommended she cut out Coke. She notes that her orthopedics recommended she pursue treatment with Prolia. Most recent DEXA from 08/23/2017 shows T score -2.5 at femur right neck. Per their recommendation, she is taking Vitamin 50,000 units once weekly as well as 6000 units daily.   Insurance coverage/medication affordability: BCBS Medicare Part D  Patient reports adherence with medications.  Current diabetes medications include: Basaglar 20 units QPM, Victoza 1.8 mg QAM Current hypertension medications include: amlodipine 2.5 mg daily, losartan 50 mg daily  Patient reports 1 episode of overnight hypoglycemia; she believes she accidentally took Basaglar twice one evening because she forgot she had already taken it.   O:   Physical Exam Constitutional:      Appearance: Normal appearance.    Review of Systems  All other systems reviewed and are negative.   Lab Results  Component Value Date   HGBA1C 8.3 (A) 05/21/2018   Vitals:   07/09/18 1400  BP: 105/67  Pulse: 79  Denies lightheadedness, dizziness.   Basic Metabolic Panel BMP Latest Ref Rng & Units 02/12/2018 11/02/2017 03/22/2017  Glucose 70 - 99 mg/dL 224(H) 408(H) 208(H)  BUN 6 - 23 mg/dL 22 37(H) 27(H)  Creatinine 0.40 - 1.20  mg/dL 1.84(H) 2.30(H) 2.24(H)  Sodium 135 - 145 mEq/L 140 137 142  Potassium 3.5 - 5.1 mEq/L 4.9 5.1 3.7  Chloride 96 - 112 mEq/L 103 103 99  CO2 19 - 32 mEq/L 33(H) 29 35(H)  Calcium 8.4 - 10.5 mg/dL 9.2 8.9 9.6    Lipid Panel     Component Value Date/Time   CHOL 133 08/02/2017 1022   TRIG 131.0 08/02/2017 1022   HDL 30.30 (L) 08/02/2017 1022   CHOLHDL 4 08/02/2017 1022   VLDL 26.2 08/02/2017 1022   LDLCALC 77 08/02/2017 1022    SMBG:  Day Before Breakfast 2 Hours After Supper Food   1-Mar 132 272 steak, baked potato, coke  29-Feb 116 127   28-Feb 178 170 salmon sandwich  27-Feb 130 154 boiled shrimp  26-Feb 163 232 mcdonalds + coke  25-Feb 188 299 mcdonalds + coke  24-Feb 115 145   23-Feb 125 327 Tomato sandwich + coke  22-Feb 92  Tomato sandwich + coke  21-Feb 100 182 boiled shrimp + ice cream  20-Feb 160 153   19-Feb 122 251 hamburger + popcorn + coke  Average 135 210      Clinical ASCVD: No  ASCVD risk factors: age, CKD,  The 10-year ASCVD risk score Mikey Bussing DC Jr., et al., 2013) is: 30.3%   Values used to calculate the score:     Age: 78 years     Sex: Female     Is Non-Hispanic African American: No     Diabetic: Yes  Tobacco smoker: No     Systolic Blood Pressure: 124 mmHg     Is BP treated: Yes     HDL Cholesterol: 30.3 mg/dL     Total Cholesterol: 133 mg/dL    A/P: #Diabetes - Currently uncontrolled d/t post-prandial readings per SMBG; most recent A1c 8.3% 05/21/2018 is improved from A1c 8.9% on 02/12/2018; Goal A1c <7% per nephrology (patient report), though could consider a more relaxed goal d/t patient's memory issues. We discussed adding insulin with supper vs low dose glipizide, though patient's daughter expressed significant concerns about adding more medication. Episode of taking Basaglar twice and causing overnight hypoglycemia is worrisome for patient's ability to safety administer rapid acting insulin.  - Patient agreed to cut back to 1 serving  of Coke per week. She will continue to record blood sugars to evaluate the impact of this change on blood sugars. Hopefully, dietary changes will be sufficient to avoid initiation of other agents with a high risk of hypoglycemia.  - Continue Basaglar 20 units daily and Victoza 1.8 mg once daily - Appointment with PCP in 1 week  #Hypertension currently controlled, and on the low side. Goal BP <140/90. She denies lightheadness/dizziness upon positional changes. - Continue amlodipine 2.5 mg daily and losartan 50 mg daily. Counseled patient to remain well hydrated; moving forward, could consider discontinuation of amlodipine. No LEE present today.   #Medication Management - Will forward patient's question regarding Prolia therapy to PCP Dr. Caryl Bis to be discussed at their appointment next week. Patient does qualify for treatment for osteoporosis, though would recommend continuing to monitor renal function. There are no dosing adjustments, but little data in patients with eGFR <30 mL/min.  Written patient instructions provided.  Total time in face to face counseling 30 minutes.    Follow up in Pharmacist Clinic Visit 3 weeks.  [name], PharmD, CPP PGY2 Ambulatory Care Pharmacy Resident Phone: [phone]    I have reviewed the above information and agree with above.   Deborra Medina, MD

## 2018-07-09 NOTE — Assessment & Plan Note (Signed)
#  Hypertension currently controlled, and on the low side. Goal BP <140/90. She denies lightheadness/dizziness upon positional changes. - Continue amlodipine 2.5 mg daily and losartan 50 mg daily. Counseled patient to remain well hydrated; moving forward, could consider discontinuation of amlodipine. No LEE present today.

## 2018-07-09 NOTE — Assessment & Plan Note (Signed)
#  Diabetes - Currently uncontrolled d/t post-prandial readings per SMBG; most recent A1c 8.3% 05/21/2018 is improved from A1c 8.9% on 02/12/2018; Goal A1c <7% per nephrology (patient report), though could consider a more relaxed goal d/t patient's memory issues. We discussed adding insulin with supper vs low dose glipizide, though patient's daughter expressed significant concerns about adding more medication. Episode of taking Basaglar twice and causing overnight hypoglycemia is worrisome for patient's ability to safety administer rapid acting insulin.  - Patient agreed to cut back to 1 serving of Coke per week. She will continue to record blood sugars to evaluate the impact of this change on blood sugars. Hopefully, dietary changes will be sufficient to avoid initiation of other agents with a high risk of hypoglycemia.  - Continue Basaglar 20 units daily and Victoza 1.8 mg once daily - Appointment with PCP in 1 week

## 2018-07-09 NOTE — Patient Instructions (Addendum)
It was great to see you both today!  We are not going to make any medication changes today. Coke seems to be having a significant impact on your blood sugars - let's see how they are without soft drinks.   Cut back to 1 Coke per week. Keep taking Basaglar 20 units daily and Victoza 1.8 mg daily.    Continue checking your blood sugar: 1) Fasting, before eating breakfast. The goal fasting blood sugar is less than 130 mg/dL 2) 2 hours after the largest meal of your day. The goal 2-hour after meal blood sugar is less than 180 mg/dL   Please feel free to contact clinic with any questions or concerns. Schedule follow up with me in 3-4 weeks.  [name], PharmD

## 2018-07-13 ENCOUNTER — Ambulatory Visit: Payer: Medicare Other | Admitting: Family Medicine

## 2018-07-17 ENCOUNTER — Ambulatory Visit (INDEPENDENT_AMBULATORY_CARE_PROVIDER_SITE_OTHER): Payer: Medicare Other | Admitting: Family Medicine

## 2018-07-17 ENCOUNTER — Encounter: Payer: Self-pay | Admitting: Family Medicine

## 2018-07-17 ENCOUNTER — Telehealth: Payer: Self-pay | Admitting: Family Medicine

## 2018-07-17 VITALS — BP 128/76 | HR 76 | Temp 97.9°F | Ht 64.0 in | Wt 149.0 lb

## 2018-07-17 DIAGNOSIS — M810 Age-related osteoporosis without current pathological fracture: Secondary | ICD-10-CM

## 2018-07-17 DIAGNOSIS — I10 Essential (primary) hypertension: Secondary | ICD-10-CM | POA: Diagnosis not present

## 2018-07-17 DIAGNOSIS — N184 Chronic kidney disease, stage 4 (severe): Secondary | ICD-10-CM | POA: Diagnosis not present

## 2018-07-17 DIAGNOSIS — Z794 Long term (current) use of insulin: Secondary | ICD-10-CM | POA: Diagnosis not present

## 2018-07-17 DIAGNOSIS — M5481 Occipital neuralgia: Secondary | ICD-10-CM

## 2018-07-17 DIAGNOSIS — R1013 Epigastric pain: Secondary | ICD-10-CM

## 2018-07-17 DIAGNOSIS — E1122 Type 2 diabetes mellitus with diabetic chronic kidney disease: Secondary | ICD-10-CM

## 2018-07-17 DIAGNOSIS — R748 Abnormal levels of other serum enzymes: Secondary | ICD-10-CM

## 2018-07-17 LAB — COMPREHENSIVE METABOLIC PANEL
ALT: 93 U/L — ABNORMAL HIGH (ref 0–35)
AST: 43 U/L — ABNORMAL HIGH (ref 0–37)
Albumin: 4.1 g/dL (ref 3.5–5.2)
Alkaline Phosphatase: 130 U/L — ABNORMAL HIGH (ref 39–117)
BUN: 31 mg/dL — ABNORMAL HIGH (ref 6–23)
CO2: 29 mEq/L (ref 19–32)
Calcium: 9.2 mg/dL (ref 8.4–10.5)
Chloride: 110 mEq/L (ref 96–112)
Creatinine, Ser: 1.76 mg/dL — ABNORMAL HIGH (ref 0.40–1.20)
GFR: 27.99 mL/min — ABNORMAL LOW (ref >60.00)
Glucose, Bld: 130 mg/dL — ABNORMAL HIGH (ref 70–99)
Potassium: 4.7 mEq/L (ref 3.5–5.1)
Sodium: 144 mEq/L (ref 135–145)
Total Bilirubin: 0.6 mg/dL (ref 0.2–1.2)
Total Protein: 6.9 g/dL (ref 6.0–8.3)

## 2018-07-17 LAB — CBC WITH DIFFERENTIAL/PLATELET
Basophils Absolute: 0.1 10*3/uL (ref 0.0–0.1)
Basophils Relative: 0.9 % (ref 0.0–3.0)
Eosinophils Absolute: 0.1 10*3/uL (ref 0.0–0.7)
Eosinophils Relative: 1.3 % (ref 0.0–5.0)
HCT: 42.4 % (ref 36.0–46.0)
Hemoglobin: 14 g/dL (ref 12.0–15.0)
Lymphocytes Relative: 16.5 % (ref 12.0–46.0)
Lymphs Abs: 1.2 10*3/uL (ref 0.7–4.0)
MCHC: 33.1 g/dL (ref 30.0–36.0)
MCV: 94.2 fl (ref 78.0–100.0)
Monocytes Absolute: 0.4 10*3/uL (ref 0.1–1.0)
Monocytes Relative: 5.5 % (ref 3.0–12.0)
Neutro Abs: 5.7 10*3/uL (ref 1.4–7.7)
Neutrophils Relative %: 75.8 % (ref 43.0–77.0)
Platelets: 175 10*3/uL (ref 150.0–400.0)
RBC: 4.5 Mil/uL (ref 3.87–5.11)
RDW: 14.1 % (ref 11.5–15.5)
WBC: 7.6 10*3/uL (ref 4.0–10.5)

## 2018-07-17 LAB — LIPASE: Lipase: 137 U/L — ABNORMAL HIGH (ref 11.0–59.0)

## 2018-07-17 NOTE — Telephone Encounter (Signed)
Called and spoke with patient regarding lab results.  Alkaline phosphatase and AST and ALT as well as lipase are all mildly elevated.  She notes no abdominal discomfort at all at this time.  She notes she feels well.  Given lack of pain currently and only mild elevation of her labs we will have her return tomorrow for repeat laboratory testing.  Discussed clear liquid diet.  Advised if she develops any abdominal pain, vomiting, diarrhea, or other symptoms to be reevaluated prior to tomorrow.  Orders placed.  Labs scheduled.

## 2018-07-17 NOTE — Patient Instructions (Signed)
Nice to see you. We will check lab work today and contact you with the results. We will get you to see a neurologist for your headaches. We will check on Prolia for your osteoporosis.

## 2018-07-17 NOTE — Progress Notes (Signed)
Tommi Rumps, MD Phone: [phone]  Kelly [name] is a 78 y.o. female who presents today for follow-up.  CC: Epigastric pain, diabetes, hypertension, headaches, osteoporosis  Epigastric pain: Patient notes this started on the way to the office.  She has had a history of gastritis previously though has not had any significant episodes until one episode last week.  It had been several years prior to that with her last episode.  She notes no nausea, vomiting, diarrhea, blood in her stool, dysuria, or vaginal discharge.  She is status post cholecystectomy and appendectomy.  She typically takes Tums for this type of issue.  Diabetes: Typically 90s-142 fasting.  2 hours postprandial at dinner 98-396.  She is on basaglar 20 units and Victoza.  She has had 1 recent hypoglycemic episode with no symptoms.  She checked her sugar and it was 74.  She drank 2 glasses of orange juice and it went to 222.  She denies polyuria and polydipsia.  Hypertension: Notes her blood pressures have been good though cannot give me numbers.  Taking losartan, Lasix, and amlodipine.  No chest pain or shortness of breath.  Headaches: Patient notes no history of headaches though over the last several weeks about once a week she will have lightninglike shooting discomfort in her left posterior occipital area.  Lasts very briefly and recurs for about 20 minutes.  Does not wake her from sleep.  Occurs once weekly.  Osteoporosis: She notes she was advised that Prolia would be a good option for her by her orthopedist.  Social History   Tobacco Use  Smoking Status Never Smoker  Smokeless Tobacco Never Used     ROS see history of present illness  Objective  Physical Exam Vitals:   07/17/18 1400  BP: 128/76  Pulse: 76  Temp: 97.9 F (36.6 C)  SpO2: 97%    BP Readings from Last 3 Encounters:  07/17/18 128/76  07/09/18 105/67  06/18/18 138/78   Wt Readings from Last 3 Encounters:  07/17/18 149 lb  (67.6 kg)  07/09/18 151 lb 9.6 oz (68.8 kg)  06/18/18 149 lb 3.2 oz (67.7 kg)    Physical Exam Constitutional:      General: She is not in acute distress.    Appearance: She is not diaphoretic.  HENT:     Head:     Comments: No scalp tenderness Cardiovascular:     Rate and Rhythm: Normal rate and regular rhythm.     Heart sounds: Normal heart sounds.  Pulmonary:     Effort: Pulmonary effort is normal.     Breath sounds: Normal breath sounds.  Abdominal:     General: Bowel sounds are normal. There is no distension.     Palpations: Abdomen is soft.     Tenderness: There is abdominal tenderness (Epigastric tenderness). There is no guarding or rebound.  Musculoskeletal:     Right lower leg: No edema.     Left lower leg: No edema.  Skin:    General: Skin is warm and dry.  Neurological:     Mental Status: She is alert.     Comments: CN 2-12 intact, 5/5 strength in bilateral biceps, triceps, grip, quads, hamstrings, plantar and dorsiflexion, sensation to light touch intact in bilateral UE and LE, normal gait, 2+ patellar reflexes      Assessment/Plan: Please see individual problem list.  Hypertension Adequately controlled.  Continue current regimen.  Epigastric pain This potentially could be related to gastritis or "gas pain."  Potentially  could also be related to a pancreatic or right upper quadrant issue.  We will check lab work to evaluate for additional causes.  She is given return precautions.  Occipital neuralgia of left side Symptoms most consistent with occipital neuralgia.  We will refer to neurology to determine if she needs MRI or further evaluation of this issue.  DM (diabetes mellitus), type 2 with renal complications (HCC) Continue current regimen.  Fasting sugars improving.  Not yet due for an A1c.  Osteoporosis We will check into getting Prolia approved for her.    Orders Placed This Encounter  Procedures  . Lipase  . CBC w/Diff  . Comp Met (CMET)  .  Ambulatory referral to Neurology    Referral Priority:   Routine    Referral Type:   Consultation    Referral Reason:   Specialty Services Required    Requested Specialty:   Neurology    Number of Visits Requested:   1    No orders of the defined types were placed in this encounter.    Tommi Rumps, MD [place]

## 2018-07-17 NOTE — Assessment & Plan Note (Signed)
Adequately controlled.  Continue current regimen. 

## 2018-07-18 ENCOUNTER — Other Ambulatory Visit (INDEPENDENT_AMBULATORY_CARE_PROVIDER_SITE_OTHER): Payer: Medicare Other

## 2018-07-18 ENCOUNTER — Other Ambulatory Visit: Payer: Self-pay

## 2018-07-18 DIAGNOSIS — R748 Abnormal levels of other serum enzymes: Secondary | ICD-10-CM

## 2018-07-18 LAB — COMPREHENSIVE METABOLIC PANEL
ALT: 88 U/L — ABNORMAL HIGH (ref 0–35)
AST: 45 U/L — ABNORMAL HIGH (ref 0–37)
Albumin: 4 g/dL (ref 3.5–5.2)
Alkaline Phosphatase: 143 U/L — ABNORMAL HIGH (ref 39–117)
BUN: 24 mg/dL — ABNORMAL HIGH (ref 6–23)
CO2: 29 mEq/L (ref 19–32)
Calcium: 8.9 mg/dL (ref 8.4–10.5)
Chloride: 106 mEq/L (ref 96–112)
Creatinine, Ser: 1.75 mg/dL — ABNORMAL HIGH (ref 0.40–1.20)
GFR: 28.17 mL/min — ABNORMAL LOW (ref >60.00)
Glucose, Bld: 124 mg/dL — ABNORMAL HIGH (ref 70–99)
Potassium: 4.5 mEq/L (ref 3.5–5.1)
Sodium: 142 mEq/L (ref 135–145)
Total Bilirubin: 0.8 mg/dL (ref 0.2–1.2)
Total Protein: 6.4 g/dL (ref 6.0–8.3)

## 2018-07-18 LAB — LIPASE: Lipase: 65 U/L — ABNORMAL HIGH (ref 11.0–59.0)

## 2018-07-20 ENCOUNTER — Other Ambulatory Visit: Payer: Self-pay | Admitting: Family

## 2018-07-20 ENCOUNTER — Telehealth: Payer: Self-pay | Admitting: Family

## 2018-07-20 DIAGNOSIS — R1011 Right upper quadrant pain: Secondary | ICD-10-CM

## 2018-07-20 NOTE — Telephone Encounter (Signed)
I have called both numbers available in chart & LM on cell phone. PEC please transfer patient.

## 2018-07-20 NOTE — Telephone Encounter (Signed)
I called patient left messages on both home and mobile number.  Would you please call patient as well?    She was seen by Dr. Caryl Bis on March 2.   they discussed doing a CT scan.  I have spoken with the CT department, and based on her kidney disease, she would NOT be able to have IV contrast.    Per protocol, she would be safe to have oral contrast and this may facilitate a better CT abdomen image.  Please speak with patient and see if need to proceed with CT Ab done today stat or if she preferrs to wait to early next week which would be priority routine.   this will be driven by symptoms. Does pt have abdominal pain, fever, N, V etc etc?

## 2018-07-23 DIAGNOSIS — M810 Age-related osteoporosis without current pathological fracture: Secondary | ICD-10-CM | POA: Insufficient documentation

## 2018-07-23 DIAGNOSIS — M5481 Occipital neuralgia: Secondary | ICD-10-CM | POA: Insufficient documentation

## 2018-07-23 DIAGNOSIS — R1013 Epigastric pain: Secondary | ICD-10-CM | POA: Insufficient documentation

## 2018-07-23 NOTE — Assessment & Plan Note (Signed)
Symptoms most consistent with occipital neuralgia.  We will refer to neurology to determine if she needs MRI or further evaluation of this issue.

## 2018-07-23 NOTE — Telephone Encounter (Signed)
Please circle back with patient.  See below messages

## 2018-07-23 NOTE — Assessment & Plan Note (Signed)
Continue current regimen.  Fasting sugars improving.  Not yet due for an A1c.

## 2018-07-23 NOTE — Assessment & Plan Note (Signed)
This potentially could be related to gastritis or "gas pain."  Potentially could also be related to a pancreatic or right upper quadrant issue.  We will check lab work to evaluate for additional causes.  She is given return precautions.

## 2018-07-23 NOTE — Assessment & Plan Note (Signed)
We will check into getting Prolia approved for her.

## 2018-07-23 NOTE — Telephone Encounter (Signed)
I have called & left message on both numbers. I asked that patient please call us back!

## 2018-07-23 NOTE — Telephone Encounter (Signed)
Kelly [name],  Ordered routine CT abdomen and pelvis without contrast.  Patient has severe kidney disease.  She can only have oral contrast.  Patient was fine with it being routine versus staff.  Please let me know we get this scheduled

## 2018-07-25 NOTE — Telephone Encounter (Signed)
She is scheduled Friday march 20. She is aware. Thx, melissa

## 2018-07-27 ENCOUNTER — Ambulatory Visit
Admission: RE | Admit: 2018-07-27 | Discharge: 2018-07-27 | Disposition: A | Discharge status: Home / Self Care | Payer: Medicare Other | Source: Ambulatory Visit | Attending: Family | Admitting: Family

## 2018-07-27 ENCOUNTER — Other Ambulatory Visit: Payer: Self-pay

## 2018-07-27 DIAGNOSIS — R1011 Right upper quadrant pain: Secondary | ICD-10-CM | POA: Diagnosis not present

## 2018-07-27 DIAGNOSIS — R1013 Epigastric pain: Secondary | ICD-10-CM | POA: Diagnosis not present

## 2018-07-30 ENCOUNTER — Ambulatory Visit: Payer: Medicare Other | Admitting: Pharmacist

## 2018-07-30 ENCOUNTER — Telehealth: Payer: Self-pay | Admitting: Pharmacist

## 2018-07-30 DIAGNOSIS — Z794 Long term (current) use of insulin: Principal | ICD-10-CM

## 2018-07-30 DIAGNOSIS — E1122 Type 2 diabetes mellitus with diabetic chronic kidney disease: Secondary | ICD-10-CM

## 2018-07-30 DIAGNOSIS — N184 Chronic kidney disease, stage 4 (severe): Secondary | ICD-10-CM

## 2018-07-30 MED ORDER — GLUCOSE BLOOD VI STRP: ORAL_STRIP | 3 refills | Status: DC

## 2018-07-30 NOTE — Telephone Encounter (Signed)
S:     Chief Complaint  Patient presents with  . Medication Management    Patient contacted telephonically for diabetes evaluation, education, and management at the request of Dr. Caryl Bis on last A1c check on 02/12/2018. She was last seen in Pharmacy Clinic on 07/09/2018. At that point, it was mutually decided by patient and her daughter to focus on intensive diet modification rather than add additional pharmacotherapy, given issues with memory and concern for hypoglycemia.  Today, patient reports that she has cut back from Coke 5-6 days per week to no more than 2 days per week. She endorses that the last one "didn't even taste that good". She has switched to water and sugar free ginger ale.    Patient reports adherence with medications.  Current diabetes medications include: Basaglar 20 units daily, Victoza 1.8 mg daily Current hypertension medications include: losartan 50 mg daily Current hyperlipidemic medications include: atorvastatin 10 mg daily  Patient denies hypoglycemic s/sx.   O:  Lab Results  Component Value Date   HGBA1C 8.3 (A) 87/86/7672   Basic Metabolic Panel BMP Latest Ref Rng & Units 07/18/2018 07/17/2018 07/05/2018  Glucose 70 - 99 mg/dL 124(H) 130(H) -  BUN 6 - 23 mg/dL 24(H) 31(H) 35(A)  Creatinine 0.40 - 1.20 mg/dL 1.75(H) 1.76(H) 1.8(A)  Sodium 135 - 145 mEq/L 142 144 144  Potassium 3.5 - 5.1 mEq/L 4.5 4.7 3.8  Chloride 96 - 112 mEq/L 106 110 -  CO2 19 - 32 mEq/L 29 29 -  Calcium 8.4 - 10.5 mg/dL 8.9 9.2 -     Lipid Panel     Component Value Date/Time   CHOL 133 08/02/2017 1022   TRIG 131.0 08/02/2017 1022   HDL 30.30 (L) 08/02/2017 1022   CHOLHDL 4 08/02/2017 1022   VLDL 26.2 08/02/2017 1022   LDLCALC 77 08/02/2017 1022    Self-Monitored Blood Glucose Results  FBG Post -Supper    121 221 Banana sandwich   134 193    103 240 Pimento cheese sandwich + yogurt   92 180    92 209 Peanut butter sandwich   102 396 Popcorn and ice cream   97 90     123 227 Pasta    76 159    129 136    139 141     174     161   Average 110 194      Clinical ASCVD: No  The 10-year ASCVD risk score Mikey Bussing DC Jr., et al., 2013) is: 41.6%   Values used to calculate the score:     Age: 68 years     Sex: Female     Is Non-Hispanic African American: No     Diabetic: Yes     Tobacco smoker: No     Systolic Blood Pressure: 094 mmHg     Is BP treated: Yes     HDL Cholesterol: 30.3 mg/dL     Total Cholesterol: 133 mg/dL    A/P: #Diabetes - Currently uncontrolled though improved per SMBG report, most recent A1c 8.3%. Goal A1c <8% likely appropriate given age, memory impairment, risk of poor outcomes with hypoglycemia. Patient is able to identify which meals raise her post-supper blood sugars the most, and has done a fantastic job decreasing soda consumption. Frequency of suppers with post-prandial BG >180 has improved since our last visit. - Continue Basaglar 20 units daily and Victoza 1.8 mg daily.  - Continue checking fasting and 2 hour post prandial sugars daily -  Recommended dietary modifications (using 1/2 banana, 1 piece of bread in sandwiches, smaller pasta serving sizes) for patient to continue to reduce carbohydrate consumption.     Follow up in Pharmacist Clinic Visit 4 weeks  [name], PharmD, [place] PGY2 Ambulatory Care Pharmacy Resident Phone: [phone]

## 2018-07-30 NOTE — Telephone Encounter (Signed)
I have reviewed the above note and agree. I was available to the pharmacist for consultation.  Tommi Rumps, MD

## 2018-08-02 DIAGNOSIS — Z85828 Personal history of other malignant neoplasm of skin: Secondary | ICD-10-CM | POA: Diagnosis not present

## 2018-08-02 DIAGNOSIS — L570 Actinic keratosis: Secondary | ICD-10-CM | POA: Diagnosis not present

## 2018-08-02 DIAGNOSIS — X32XXXA Exposure to sunlight, initial encounter: Secondary | ICD-10-CM | POA: Diagnosis not present

## 2018-08-02 DIAGNOSIS — D2261 Melanocytic nevi of right upper limb, including shoulder: Secondary | ICD-10-CM | POA: Diagnosis not present

## 2018-08-02 DIAGNOSIS — D0462 Carcinoma in situ of skin of left upper limb, including shoulder: Secondary | ICD-10-CM | POA: Diagnosis not present

## 2018-08-02 DIAGNOSIS — D485 Neoplasm of uncertain behavior of skin: Secondary | ICD-10-CM | POA: Diagnosis not present

## 2018-08-02 DIAGNOSIS — D2262 Melanocytic nevi of left upper limb, including shoulder: Secondary | ICD-10-CM | POA: Diagnosis not present

## 2018-08-02 DIAGNOSIS — D2272 Melanocytic nevi of left lower limb, including hip: Secondary | ICD-10-CM | POA: Diagnosis not present

## 2018-08-02 DIAGNOSIS — Z08 Encounter for follow-up examination after completed treatment for malignant neoplasm: Secondary | ICD-10-CM | POA: Diagnosis not present

## 2018-08-04 ENCOUNTER — Other Ambulatory Visit: Payer: Self-pay | Admitting: Family Medicine

## 2018-08-04 DIAGNOSIS — Z794 Long term (current) use of insulin: Principal | ICD-10-CM

## 2018-08-04 DIAGNOSIS — E1122 Type 2 diabetes mellitus with diabetic chronic kidney disease: Secondary | ICD-10-CM

## 2018-08-04 DIAGNOSIS — N184 Chronic kidney disease, stage 4 (severe): Secondary | ICD-10-CM

## 2018-08-04 MED ORDER — BASAGLAR KWIKPEN 100 UNIT/ML ~~LOC~~ SOPN: 20.0000 [IU] | PEN_INJECTOR | Freq: Every day | SUBCUTANEOUS | 3 refills | Status: DC

## 2018-08-06 ENCOUNTER — Encounter: Payer: Self-pay | Admitting: Family Medicine

## 2018-08-07 NOTE — Telephone Encounter (Signed)
Sent to Catie Ok to switch this to a phone visit?

## 2018-08-08 DIAGNOSIS — M47812 Spondylosis without myelopathy or radiculopathy, cervical region: Secondary | ICD-10-CM | POA: Diagnosis not present

## 2018-08-13 ENCOUNTER — Telehealth: Payer: Self-pay | Admitting: Family Medicine

## 2018-08-13 ENCOUNTER — Other Ambulatory Visit: Payer: Self-pay | Admitting: *Deleted

## 2018-08-13 ENCOUNTER — Telehealth: Payer: Self-pay | Admitting: Gastroenterology

## 2018-08-13 DIAGNOSIS — E1122 Type 2 diabetes mellitus with diabetic chronic kidney disease: Secondary | ICD-10-CM

## 2018-08-13 DIAGNOSIS — Z794 Long term (current) use of insulin: Principal | ICD-10-CM

## 2018-08-13 DIAGNOSIS — N184 Chronic kidney disease, stage 4 (severe): Secondary | ICD-10-CM

## 2018-08-13 MED ORDER — BASAGLAR KWIKPEN 100 UNIT/ML ~~LOC~~ SOPN: 20.0000 [IU] | PEN_INJECTOR | Freq: Every day | SUBCUTANEOUS | 0 refills | Status: DC

## 2018-08-13 NOTE — Telephone Encounter (Signed)
Patient left voicemail saying that [facility] didn't receive the prescription for Basaglar insulin.  Please resubmit this medication. thanks

## 2018-08-13 NOTE — Telephone Encounter (Signed)
Medication resent to [facility] electronically.

## 2018-08-13 NOTE — Telephone Encounter (Signed)
Pt left vm  She states she is receiving emails that she needs to schedule an apt she is due for colonoscopy she doesn't understand why we are sending her messages for her to call us if we are not open ?

## 2018-08-23 DIAGNOSIS — M545 Low back pain: Secondary | ICD-10-CM | POA: Diagnosis not present

## 2018-08-23 DIAGNOSIS — M542 Cervicalgia: Secondary | ICD-10-CM | POA: Diagnosis not present

## 2018-08-23 DIAGNOSIS — M5136 Other intervertebral disc degeneration, lumbar region: Secondary | ICD-10-CM | POA: Diagnosis not present

## 2018-08-23 DIAGNOSIS — T2101XA Burn of unspecified degree of chest wall, initial encounter: Secondary | ICD-10-CM | POA: Diagnosis not present

## 2018-08-23 DIAGNOSIS — M25519 Pain in unspecified shoulder: Secondary | ICD-10-CM | POA: Diagnosis not present

## 2018-08-23 DIAGNOSIS — M7062 Trochanteric bursitis, left hip: Secondary | ICD-10-CM | POA: Diagnosis not present

## 2018-08-23 DIAGNOSIS — M25572 Pain in left ankle and joints of left foot: Secondary | ICD-10-CM | POA: Diagnosis not present

## 2018-08-23 DIAGNOSIS — E1165 Type 2 diabetes mellitus with hyperglycemia: Secondary | ICD-10-CM | POA: Diagnosis not present

## 2018-08-23 DIAGNOSIS — Q762 Congenital spondylolisthesis: Secondary | ICD-10-CM | POA: Diagnosis not present

## 2018-08-23 DIAGNOSIS — R52 Pain, unspecified: Secondary | ICD-10-CM | POA: Diagnosis not present

## 2018-08-23 DIAGNOSIS — M5032 Other cervical disc degeneration, mid-cervical region, unspecified level: Secondary | ICD-10-CM | POA: Diagnosis not present

## 2018-08-23 DIAGNOSIS — M6281 Muscle weakness (generalized): Secondary | ICD-10-CM | POA: Diagnosis not present

## 2018-08-27 ENCOUNTER — Telehealth: Payer: Self-pay | Admitting: Pharmacist

## 2018-08-27 ENCOUNTER — Ambulatory Visit: Payer: Medicare Other | Admitting: Pharmacist

## 2018-08-27 DIAGNOSIS — E1122 Type 2 diabetes mellitus with diabetic chronic kidney disease: Secondary | ICD-10-CM

## 2018-08-27 DIAGNOSIS — Z794 Long term (current) use of insulin: Principal | ICD-10-CM

## 2018-08-27 DIAGNOSIS — N184 Chronic kidney disease, stage 4 (severe): Secondary | ICD-10-CM

## 2018-08-27 MED ORDER — BASAGLAR KWIKPEN 100 UNIT/ML ~~LOC~~ SOPN: 20.0000 [IU] | PEN_INJECTOR | Freq: Every day | SUBCUTANEOUS | 1 refills | Status: DC

## 2018-08-27 NOTE — Telephone Encounter (Signed)
S:     Chief Complaint  Patient presents with  . Medication Management    Diabetes    Patient contacted telephonically for diabetes evaluation, education, and management at the request of Dr. Caryl Bis (referred on A1c check on 02/12/2018). Last seen by primary care provider on 07/17/2018.  Today, she reports that she has completed stopped drinking Coca Cola. She drinks coffee and water. She is checking blood sugars BID-TID and endorses adherence to her medications.   Patient reports adherence with medications.  Current diabetes medications include: Victoza 1.8 mg daily, Lantus 20 units daily  Patient denies hypoglycemic s/sx including dizziness, shakiness, sweating.   Patient reported dietary habits: Eats 2-3 meals/day Breakfast: Nothing or 2 fried eggs; sometimes coffee, sometimes water Lunch: Banana sandwich, egg salad sandwich w/ 1 piece of bread; BBQ sandwich (1 piece of bread) + slaw;  Dinner: Frozen meals (salmon, tilapia, flounder);  Snacks: Hasn't been snacking; apple once in a while Drinks: Has completely cut out Coca Cola products.   O:  Lab Results  Component Value Date   HGBA1C 8.3 (A) 44/96/7591   Basic Metabolic Panel BMP Latest Ref Rng & Units 07/18/2018 07/17/2018 07/05/2018  Glucose 70 - 99 mg/dL 124(H) 130(H) -  BUN 6 - 23 mg/dL 24(H) 31(H) 35(A)  Creatinine 0.40 - 1.20 mg/dL 1.75(H) 1.76(H) 1.8(A)  Sodium 135 - 145 mEq/L 142 144 144  Potassium 3.5 - 5.1 mEq/L 4.5 4.7 3.8  Chloride 96 - 112 mEq/L 106 110 -  CO2 19 - 32 mEq/L 29 29 -  Calcium 8.4 - 10.5 mg/dL 8.9 9.2 -   Lipid Panel     Component Value Date/Time   CHOL 133 08/02/2017 1022   TRIG 131.0 08/02/2017 1022   HDL 30.30 (L) 08/02/2017 1022   CHOLHDL 4 08/02/2017 1022   VLDL 26.2 08/02/2017 1022   LDLCALC 77 08/02/2017 1022    Self-Monitored Blood Glucose Results  Fasting After breakfast Before Lunch After Lunch Before Supper After Supper  10-Apr     111 95  11-Apr    138 87 107  12-Apr    150 175    13-Apr 160 213  81    15-Apr 86    98   16-Apr 124     252  17-Apr  160  211  112  18-Apr 122   210 84   19-Apr   125     Avg 123 186 137 163 95 142   7 day average: 148 14 day average: 150 30 day average: 168  A/P: #Diabetes Currently improved per SMBG results, though most A1c 8.3% on 05/21/2018. Goal A1c <8% like appropriate given patient's age and history of hypoglycemia unawareness. BG averages show that she is most likely at this goal. Medication adherence and dietary changes (discontinuation of sodas, using 1 piece of bread on sandwiches, avoiding fried foods) has made an appreciable impact on her sugar control. Previously, daughter expressed significant concerns about addition of meal time insulin or mealtime sulfonylurea and risk of hypoglycemia in the patient.  - Continue current doses: Basaglar 20 units daily and Victoza 1.8 mg daily - Continue to check blood sugars up to 3 times daily, alternating between pre prandial and post prandial.  - Continue to be cognizant of carbohydrate consumption, and use post-prandial blood sugar checks as benchmarks of appropriate sugar content in meals - Due for A1c, however, coronavirus pandemic limits ability to come into clinic.      Follow up in [facility]  Visit 4 weeks.   [name], PharmD, [place] PGY2 Ambulatory Care Pharmacy Resident Phone: [phone]

## 2018-08-28 NOTE — Telephone Encounter (Signed)
I have reviewed the above note and agree. I was available to the pharmacist for consultation.  Tommi Rumps, MD

## 2018-08-29 DIAGNOSIS — M818 Other osteoporosis without current pathological fracture: Secondary | ICD-10-CM | POA: Diagnosis not present

## 2018-08-29 DIAGNOSIS — E559 Vitamin D deficiency, unspecified: Secondary | ICD-10-CM | POA: Diagnosis not present

## 2018-08-30 ENCOUNTER — Telehealth: Payer: Self-pay | Admitting: *Deleted

## 2018-08-30 ENCOUNTER — Telehealth: Payer: Self-pay | Admitting: Cardiovascular Disease

## 2018-08-30 DIAGNOSIS — M810 Age-related osteoporosis without current pathological fracture: Secondary | ICD-10-CM

## 2018-08-30 NOTE — Telephone Encounter (Signed)
Prolia has been approved for this patient OK to schedule?

## 2018-08-30 NOTE — Telephone Encounter (Signed)
She needs to have a vitamin D level checked first. I have placed an order and she can be scheduled for labs. Once that returns we can get her set up with the prolia appointment.

## 2018-08-30 NOTE — Telephone Encounter (Signed)
Virtual Visit Pre-Appointment Phone Call  "(Name), I am calling you today to discuss your upcoming appointment. We are currently trying to limit exposure to the virus that causes COVID-19 by seeing patients at home rather than in the office."  1. "What is the BEST phone number to call the day of the visit?" - include this in appointment notes  2. Do you have or have access to (through a family member/friend) a smartphone with video capability that we can use for your visit?" a. If yes - list this number in appt notes as cell (if different from BEST phone #) and list the appointment type as a VIDEO visit in appointment notes b. If no - list the appointment type as a PHONE visit in appointment notes  3. Confirm consent - "In the setting of the current Covid19 crisis, you are scheduled for a (phone or video) visit with your provider on (date) at (time).  Just as we do with many in-office visits, in order for you to participate in this visit, we must obtain consent.  If you'd like, I can send this to your mychart (if signed up) or email for you to review.  Otherwise, I can obtain your verbal consent now.  All virtual visits are billed to your insurance company just like a normal visit would be.  By agreeing to a virtual visit, we'd like you to understand that the technology does not allow for your provider to perform an examination, and thus may limit your provider's ability to fully assess your condition. If your provider identifies any concerns that need to be evaluated in person, we will make arrangements to do so.  Finally, though the technology is pretty good, we cannot assure that it will always work on either your or our end, and in the setting of a video visit, we may have to convert it to a phone-only visit.  In either situation, we cannot ensure that we have a secure connection.  Are you willing to proceed?" STAFF: Did the patient verbally acknowledge consent to telehealth visit? Document  YES/NO here: YES  4. Advise patient to be prepared - "Two hours prior to your appointment, go ahead and check your blood pressure, pulse, oxygen saturation, and your weight (if you have the equipment to check those) and write them all down. When your visit starts, your provider will ask you for this information. If you have an Apple Watch or Kardia device, please plan to have heart rate information ready on the day of your appointment. Please have a pen and paper handy nearby the day of the visit as well."  5. Give patient instructions for MyChart download to smartphone OR [email or web address] as below if video visit (depending on what platform provider is using)  6. Inform patient they will receive a phone call 15 minutes prior to their appointment time (may be from unknown caller ID) so they should be prepared to answer    Kelly has been deemed a candidate for a follow-up tele-health visit to limit community exposure during the Covid-19 pandemic. I spoke with the patient via phone to ensure availability of phone/video source, confirm preferred email & phone number, and discuss instructions and expectations.  I reminded Kelly [name] to be prepared with any vital sign and/or heart rhythm information that could potentially be obtained via home monitoring, at the time of her visit. I reminded Kelly [name] to expect a phone call prior to  her visit.  Kelly [name] 08/30/2018 10:15 AM   INSTRUCTIONS FOR DOWNLOADING THE MYCHART APP TO SMARTPHONE  - The patient must first make sure to have activated MyChart and know their login information - If Apple, go to [employer] and type in MyChart in the search bar and download the app. If Android, ask patient to go to [employer] and type in [place] in the search bar and download the app. The app is free but as with any other app downloads, their phone may require them to verify saved payment  information or Apple/Android password.  - The patient will need to then log into the app with their MyChart username and password, and select [facility] as their healthcare provider to link the account. When it is time for your visit, go to the MyChart app, find appointments, and click Begin Video Visit. Be sure to Select Allow for your device to access the Microphone and Camera for your visit. You will then be connected, and your provider will be with you shortly.  **If they have any issues connecting, or need assistance please contact MyChart service desk [phone])**  **If using a computer, in order to ensure the best quality for their visit they will need to use either of the following Internet Browsers: [employer], or Google Chrome**  IF USING DOXIMITY or DOXY.ME - The patient will receive a link just prior to their visit by text.     FULL LENGTH CONSENT FOR TELE-HEALTH VISIT   I hereby voluntarily request, consent and authorize [facility] and its employed or contracted physicians, physician assistants, nurse practitioners or other licensed health care professionals (the Practitioner), to provide me with telemedicine health care services (the Services") as deemed necessary by the treating Practitioner. I acknowledge and consent to receive the Services by the Practitioner via telemedicine. I understand that the telemedicine visit will involve communicating with the Practitioner through live audiovisual communication technology and the disclosure of certain medical information by electronic transmission. I acknowledge that I have been given the opportunity to request an in-person assessment or other available alternative prior to the telemedicine visit and am voluntarily participating in the telemedicine visit.  I understand that I have the right to withhold or withdraw my consent to the use of telemedicine in the course of my care at any time, without affecting my right  to future care or treatment, and that the Practitioner or I may terminate the telemedicine visit at any time. I understand that I have the right to inspect all information obtained and/or recorded in the course of the telemedicine visit and may receive copies of available information for a reasonable fee.  I understand that some of the potential risks of receiving the Services via telemedicine include:   Delay or interruption in medical evaluation due to technological equipment failure or disruption;  Information transmitted may not be sufficient (e.g. poor resolution of images) to allow for appropriate medical decision making by the Practitioner; and/or   In rare instances, security protocols could fail, causing a breach of personal health information.  Furthermore, I acknowledge that it is my responsibility to provide information about my medical history, conditions and care that is complete and accurate to the best of my ability. I acknowledge that Practitioner's advice, recommendations, and/or decision may be based on factors not within their control, such as incomplete or inaccurate data provided by me or distortions of diagnostic images or specimens that may result from electronic transmissions. I  understand that the practice of medicine is not an exact science and that Practitioner makes no warranties or guarantees regarding treatment outcomes. I acknowledge that I will receive a copy of this consent concurrently upon execution via email to the email address I last provided but may also request a printed copy by calling the office of [facility].    I understand that my insurance will be billed for this visit.   I have read or had this consent read to me.  I understand the contents of this consent, which adequately explains the benefits and risks of the Services being provided via telemedicine.   I have been provided ample opportunity to ask questions regarding this consent and the Services  and have had my questions answered to my satisfaction.  I give my informed consent for the services to be provided through the use of telemedicine in my medical care  By participating in this telemedicine visit I agree to the above.

## 2018-09-03 NOTE — Telephone Encounter (Signed)
Appears patient is taking Prolia from [facility], below is note from [employer] from [facility].   Discussion Note  The patient has elected to proceed with the Prolia injection today. We reviewed side effects and risks. See above procedure note. The patient will continue to take calcium and vitamin D and will return in six months for repeat injection.   I will resend a prescription for vitamin D 50,000 IUs weekly. She will take this for one more month to complete three months of treatment. She will then continue with her daily dose of vitamin D. She will be seeing her PCP at the end of June. I have recommended that they recheck her vitamin D level at that time.   The patient agrees to continue with previously prescribed exercise and Wellness programs.   The patient understands the importance of compliance and missing an injection could increase their risk of subsequent fracture. Thank you very much for this referral.  Patient educational handouts: No information available.

## 2018-09-03 NOTE — Telephone Encounter (Signed)
Thanks for finding that. I was not aware of that.

## 2018-09-04 ENCOUNTER — Telehealth: Payer: Medicare Other | Admitting: Cardiovascular Disease

## 2018-09-10 DIAGNOSIS — R51 Headache: Secondary | ICD-10-CM | POA: Diagnosis not present

## 2018-09-10 DIAGNOSIS — R519 Headache, unspecified: Secondary | ICD-10-CM | POA: Insufficient documentation

## 2018-09-24 ENCOUNTER — Other Ambulatory Visit: Payer: Self-pay | Admitting: Family Medicine

## 2018-09-24 ENCOUNTER — Ambulatory Visit: Payer: Medicare Other | Admitting: Pharmacist

## 2018-09-24 ENCOUNTER — Encounter: Payer: Self-pay | Admitting: Pharmacist

## 2018-09-24 ENCOUNTER — Telehealth: Payer: Self-pay | Admitting: Pharmacist

## 2018-09-24 DIAGNOSIS — E785 Hyperlipidemia, unspecified: Secondary | ICD-10-CM

## 2018-09-24 DIAGNOSIS — R252 Cramp and spasm: Secondary | ICD-10-CM

## 2018-09-24 NOTE — Telephone Encounter (Signed)
Last OV 07/17/2018  Last refilled  cyclobenzaprine (FLEXERIL) 10 MG tablet 45 tablet 0 06/28/2018   atorvastatin (LIPITOR) 10 MG tablet 90 tablet 1 05/25/2018    Insulin Pen Needle (BD PEN NEEDLE NANO U/F) 32G X 4 MM MISC 200 each 2 11/06/2017     Next OV 10/31/2018  Sent to PCP for approval for controlled

## 2018-09-24 NOTE — Telephone Encounter (Signed)
I have reviewed the above note and agree. I was available to the pharmacist for consultation.  Tommi Rumps, MD

## 2018-09-24 NOTE — Telephone Encounter (Addendum)
S:     Chief Complaint  Patient presents with  . Medication Management    Diabetes     Patient contacted telephonically for diabetes evaluation, education, and management at the request of Dr. Caryl Bis (referred on A1c check on 02/12/2018). Last visit with Pharmacist Clinic on 08/27/2018.  Ms, Kelly [name] reports she is doing well. She does express increased muscle cramps and is taking her cyclobenzaprine more regularly, 10mg  daily. She states at a recent neurology visit she was started on gabapentin 100mg  twice daily. She reports some hypoglycemia with the feeling of jittery, nausea. She states she often over compensates with 2 full glasses of orange juice and her blood sugars then go very high. She reports she has stayed off Coca Cola products for the most part but did "backslide" and had 3 last week.    She reports through her insurance she was given a free glucometer and testing supplies. She also has access to a website that connects to her meter, post tips, gives her averages, and helps her understand her numbers. She is very pleased with this service.    Insurance coverage/medication affordability: Medicare, BCBS  Patient reports adherence with medications.  Current diabetes medications include: Victoza 1.8 mg daily, Lantus 20 units daily  Patient reports hypoglycemic s/sx including dizziness, shakiness, sweating.    Patient reported dietary habits: Eats 2-3 meals/day Breakfast: Nothing or 2 fried eggs; sometimes coffee, sometimes water Lunch: Banana sandwich, egg salad sandwich w/ 1 piece of bread; BBQ sandwich (1 piece of bread) + slaw;  Dinner: Frozen meals (salmon, tilapia, flounder);  Snacks: Hasn't been snacking; apple once in a while Drinks:3 soft drinks last week, but back to non-sugary drinks now    Patient reported exercise habits: walks dog    O:   Lab Results  Component Value Date   HGBA1C 8.3 (A) 07/37/1062    Basic Metabolic Panel BMP Latest Ref Rng & Units  07/18/2018 07/17/2018 07/05/2018  Glucose 70 - 99 mg/dL 124(H) 130(H) -  BUN 6 - 23 mg/dL 24(H) 31(H) 35(A)  Creatinine 0.40 - 1.20 mg/dL 1.75(H) 1.76(H) 1.8(A)  Sodium 135 - 145 mEq/L 142 144 144  Potassium 3.5 - 5.1 mEq/L 4.5 4.7 3.8  Chloride 96 - 112 mEq/L 106 110 -  CO2 19 - 32 mEq/L 29 29 -  Calcium 8.4 - 10.5 mg/dL 8.9 9.2 -     Lipid Panel     Component Value Date/Time   CHOL 133 08/02/2017 1022   TRIG 131.0 08/02/2017 1022   HDL 30.30 (L) 08/02/2017 1022   CHOLHDL 4 08/02/2017 1022   VLDL 26.2 08/02/2017 1022   LDLCALC 77 08/02/2017 1022    Self-Monitored Blood Glucose Results  High Low  Average   90 days 269 65 131  30 days 196 106 141  14 days  155 106 131    A/P: #Diabetes - Currently uncontrolled, most recent A1c 8.3% on 05/21/18, improved from 8.9% on 02/12/18; Goal A1c <7%. Patient reports hypoglycemic events, however none in the past 30 days. Patient reports adherence with medication. Control is suboptimal due to medication optimization and dietary excursions.  - Continue current doses: Basaglar 20 units daily and Victoza 1.8 mg daily - Continue to check blood sugars up to 3 times daily, alternating between pre prandial and post prandial.  - Counseled on hypoglycemia and how to correct with 1/2 cup of a sugary drink so she does not over correct. Also counseled to recheck blood glucose 15 minutes later to  ensure it is increasing.  - Due for A1c, however, coronavirus pandemic limits ability to come into clinic. Scheduled for PCP 10/31/18 - appropriate to check A1C then.   Written patient instructions provided.  Total time in telephonic counseling 20 minutes.    Follow up in Pharmacist Clinic Visit as needed. Follow up with PCP 10/31/18.  Patient seen with Isaias Sakai, PharmD, PGY1.   [name], PharmD, [place] PGY2 Ambulatory Care Pharmacy Resident Phone: [phone]

## 2018-10-04 DIAGNOSIS — D485 Neoplasm of uncertain behavior of skin: Secondary | ICD-10-CM | POA: Diagnosis not present

## 2018-10-04 DIAGNOSIS — D0462 Carcinoma in situ of skin of left upper limb, including shoulder: Secondary | ICD-10-CM | POA: Diagnosis not present

## 2018-10-16 DIAGNOSIS — D0462 Carcinoma in situ of skin of left upper limb, including shoulder: Secondary | ICD-10-CM | POA: Diagnosis not present

## 2018-10-31 ENCOUNTER — Ambulatory Visit (INDEPENDENT_AMBULATORY_CARE_PROVIDER_SITE_OTHER): Payer: Medicare Other | Admitting: Family Medicine

## 2018-10-31 ENCOUNTER — Other Ambulatory Visit: Payer: Self-pay

## 2018-10-31 ENCOUNTER — Ambulatory Visit (INDEPENDENT_AMBULATORY_CARE_PROVIDER_SITE_OTHER): Payer: Medicare Other

## 2018-10-31 ENCOUNTER — Encounter: Payer: Self-pay | Admitting: Family Medicine

## 2018-10-31 DIAGNOSIS — Z0000 Encounter for general adult medical examination without abnormal findings: Secondary | ICD-10-CM

## 2018-10-31 DIAGNOSIS — Z9989 Dependence on other enabling machines and devices: Secondary | ICD-10-CM

## 2018-10-31 DIAGNOSIS — G4733 Obstructive sleep apnea (adult) (pediatric): Secondary | ICD-10-CM

## 2018-10-31 DIAGNOSIS — N184 Chronic kidney disease, stage 4 (severe): Secondary | ICD-10-CM | POA: Diagnosis not present

## 2018-10-31 DIAGNOSIS — R252 Cramp and spasm: Secondary | ICD-10-CM

## 2018-10-31 DIAGNOSIS — E1122 Type 2 diabetes mellitus with diabetic chronic kidney disease: Secondary | ICD-10-CM

## 2018-10-31 DIAGNOSIS — I10 Essential (primary) hypertension: Secondary | ICD-10-CM | POA: Diagnosis not present

## 2018-10-31 DIAGNOSIS — G250 Essential tremor: Secondary | ICD-10-CM

## 2018-10-31 DIAGNOSIS — Z794 Long term (current) use of insulin: Secondary | ICD-10-CM | POA: Diagnosis not present

## 2018-10-31 NOTE — Assessment & Plan Note (Signed)
Patient will check her blood pressures at home.  She will contact us with her readings.  She will continue her current medications.  She will come in for lab work.

## 2018-10-31 NOTE — Progress Notes (Signed)
Subjective:   Kelly [name] is a 78 y.o. female who presents for Medicare Annual (Subsequent) preventive examination.  Review of Systems:  No ROS.  Medicare Wellness Virtual Visit.  Visual/audio telehealth visit, UTA vital signs.   See social history for additional risk factors.   Cardiac Risk Factors include: advanced age (>66men, >4 women)     Objective:     Vitals: There were no vitals taken for this visit.  There is no height or weight on file to calculate BMI.  Advanced Directives 10/31/2018 02/06/2018 10/25/2017 08/22/2017 05/23/2017 07/08/2016 07/08/2016  Does Patient Have a Medical Advance Directive? Yes Yes Yes Yes Yes Yes Yes  Type of Paramedic of [place];Living will [place];Living will [place];Living will - Healthcare Power of [place]  Does patient want to make changes to medical advance directive? No - Patient declined - No - Patient declined - - No - Patient declined -  Copy of [place] in Chart? No - copy requested No - copy requested No - copy requested - - No - copy requested No - copy requested  Would patient like information on creating a medical advance directive? - - - - - No - Patient declined -    Tobacco Social History   Tobacco Use  Smoking Status Never Smoker  Smokeless Tobacco Never Used     Counseling given: Not Answered   Clinical Intake:  Pre-visit preparation completed: Yes        Diabetes: Yes(Followed by pcp)  How often do you need to have someone help you when you read instructions, pamphlets, or other written materials from your doctor or pharmacy?: 1 - Never  Interpreter Needed?: No     Past Medical History:  Diagnosis Date  . Colon polyps   . Depression   . DM (diabetes mellitus), type 2 with renal complications ([place])   . Hyperlipidemia   . Hypertension   . Kidney stones   .  Sleep apnea   . 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    Past Surgical History:  Procedure Laterality Date  . APPENDECTOMY    . BREAST BIOPSY Right 06/19/2018   affirm stereo/x clip/path pending  . cataract  03/2017  . CHOLECYSTECTOMY    . COLONOSCOPY    . COLONOSCOPY WITH PROPOFOL N/A 08/22/2017   Procedure: COLONOSCOPY WITH PROPOFOL;  Surgeon: Jonathon Bellows, MD;  Location: [facility] ENDOSCOPY;  Service: Gastroenterology;  Laterality: N/A;  . ESOPHAGOGASTRODUODENOSCOPY    . ESOPHAGOGASTRODUODENOSCOPY (EGD) WITH PROPOFOL N/A 03/23/2016   Procedure: ESOPHAGOGASTRODUODENOSCOPY (EGD) WITH PROPOFOL;  Surgeon: Manya Silvas, MD;  Location: [facility] ENDOSCOPY;  Service: Endoscopy;  Laterality: N/A;  . FLEXIBLE SIGMOIDOSCOPY N/A 02/06/2018   Procedure: FLEXIBLE SIGMOIDOSCOPY;  Surgeon: Jonathon Bellows, MD;  Location: [facility] ENDOSCOPY;  Service: Gastroenterology;  Laterality: N/A;  . RECTAL PROLAPSE REPAIR  2012   x2   Family History  Problem Relation Age of Onset  . Stroke Mother   . Arthritis Mother   . Aortic aneurysm Sister   . Lung cancer Sister   . Heart attack Father 64  . Breast cancer Sister 72   Social History   Socioeconomic History  . Marital status: Widowed    Spouse name: Not on file  . Number of children: Not on file  . Years of education: Not on file  . Highest education level: Not on file  Occupational History  . Not on file  Social Needs  . Financial resource strain: Not hard at all  . Food insecurity    Worry: Never true    Inability: Never true  . Transportation needs    Medical: No    Non-medical: No  Tobacco Use  . Smoking status: Never Smoker  . Smokeless tobacco: Never Used  Substance and Sexual Activity  . Alcohol use: No  . Drug use: No  . Sexual activity: Not Currently    Partners: Male  Lifestyle  . Physical activity    Days per week: 0 days    Minutes per session: Not on file  . Stress: Not at all  Relationships  . Social  Herbalist on phone: Not on file    Gets together: Not on file    Attends religious service: Not on file    Active member of club or organization: Not on file    Attends meetings of clubs or organizations: Not on file    Relationship status: Not on file  Other Topics Concern  . Not on file  Social History Narrative  . Not on file    Outpatient Encounter Medications as of 10/31/2018  Medication Sig  . ALPRAZolam (XANAX) 0.5 MG tablet Take 0.5 tablets (0.25 mg total) by mouth 2 (two) times daily as needed for anxiety.  Marland Kitchen amLODipine (NORVASC) 2.5 MG tablet TAKE 1 TABLET DAILY  . aspirin EC 81 MG tablet Take 81 mg by mouth daily.  Marland Kitchen atorvastatin (LIPITOR) 10 MG tablet TAKE 1 TABLET DAILY  . cholecalciferol (VITAMIN D3) 25 MCG (1000 UT) tablet Take 6,000 Units by mouth daily.  . cyclobenzaprine (FLEXERIL) 10 MG tablet TAKE 1/2 TABLET (5MG  TOTAL)3 TIMES A DAY AS NEEDED    FOR MUSCLE SPASMS  . denosumab (PROLIA) 60 MG/ML SOSY injection Inject 60 mg into the skin every 6 (six) months.  . ergocalciferol (VITAMIN D2) 1.25 MG (50000 UT) capsule Take 50,000 Units by mouth once a week.  . furosemide (LASIX) 40 MG tablet Take 1 tablet (40 mg total) by mouth daily as needed.  . gabapentin (NEURONTIN) 100 MG capsule Take 100 mg by mouth 2 (two) times daily.  Marland Kitchen glucose blood (ACCU-CHEK AVIVA PLUS) test strip Use to check blood sugar up to 4 times daily  . HYDROcodone-acetaminophen (NORCO) 5-325 MG tablet Norco 5 mg-325 mg tablet  Take 1 tablet twice a day by oral route as needed.  . Insulin Glargine (BASAGLAR KWIKPEN) 100 UNIT/ML SOPN Inject 0.2 mLs (20 Units total) into the skin daily.  . Insulin Pen Needle (BD PEN NEEDLE NANO U/F) 32G X 4 MM MISC USE DAILY WITH VICTOZA AND BASAGLAR  . Lancets MISC Use up to 4 times daily to check blood sugars.  . liraglutide (VICTOZA) 18 MG/3ML SOPN Inject 0.3 mLs (1.8 mg total) into the skin daily.  Marland Kitchen losartan (COZAAR) 50 MG tablet TAKE 1 TABLET DAILY  .  PARoxetine (PAXIL-CR) 25 MG 24 hr tablet TAKE 1 TABLET DAILY  . potassium chloride (K-DUR) 10 MEQ tablet potassium chloride ER 10 mEq tablet,extended release   No facility-administered encounter medications on file as of 10/31/2018.     Activities of Daily Living In your present state of health, do you have any difficulty performing the following activities: 10/31/2018  Hearing? Y  Comment Difficulty hearing television or in a crowd of people. Notes audiology testing complete and plans to pursue hearing aids.  Vision? N  Difficulty concentrating  or making decisions? N  Walking or climbing stairs? N  Dressing or bathing? N  Doing errands, shopping? N  Preparing Food and eating ? N  Using the Toilet? N  In the past six months, have you accidently leaked urine? N  Do you have problems with loss of bowel control? N  Managing your Medications? N  Managing your Finances? N  Housekeeping or managing your Housekeeping? N  Some recent data might be hidden    Patient Care Team: [name], MD as PCP - General (Family Medicine)    Assessment:   This is a routine wellness examination for Elke.  I connected with patient 10/31/18 at 10:00 AM EDT by an audio enabled telemedicine application and verified that I am speaking with the correct person using two identifiers. Patient stated full name and DOB. Patient gave permission to continue with virtual visit. Patient's location was at home and Nurse's location was at [place] office.   Health Screenings  Mammogram - 05/2018 Colonoscopy -  Bone Density - 08/2017 Glaucoma -none Hearing -demonstrates normal hearing during visit although difficulty hearing the television and conversational tones in a crowd. She plans to follow up with her insurance regarding financial assistance with insurance.  POCT Hemoglobin A1C - 05/2018 Labs followed by pcp.  Dental- visits every 6 months. Last cleaning 10/30/18.  Vision- visits every 12 months, she  plans to schedule diabetic eye exam.   Social  Alcohol intake - no       Smoking history- never    Smokers in home? none Illicit drug use? none Exercise - walking about 3 days weekly, 30 minutes  Diet - diabetic Sexually Active -not currently BMI- discussed the importance of a healthy diet, water intake and the benefits of aerobic exercise.  Educational material provided.   Safety  Patient feels safe at home- yes Patient does have smoke detectors at home- yes Patient does wear sunscreen or protective clothing when in direct sunlight -yes Patient does wear seat belt when in a moving vehicle -yes Patient drives- yes  [id number] precautions and sickness symptoms discussed.   Activities of Daily Living Patient denies needing assistance with: driving, household chores, feeding themselves, getting from bed to chair, getting to the toilet, bathing/showering, dressing, managing money, or preparing meals.  No new identified risk were noted.    Depression Screen Patient denies losing interest in daily life, feeling hopeless, or crying easily over simple problems.   Medication-taking as directed and without issues.   Fall Screen Patient denies being afraid of falling or falling in the last year.   Memory Screen Patient is alert.  Patient denies difficulty focusing, concentrating or misplacing items. Correctly identified the president of the [place], season and recall. Patient likes to read, plays computer games, and complete crossword  puzzles for brain stimulation.  Immunizations The following Immunizations were discussed: Influenza, shingles, pneumonia, and tetanus.   Other Providers Patient Care Team: [name], MD as PCP - General (Family Medicine)  Exercise Activities and Dietary recommendations Current Exercise Habits: Home exercise routine, Type of exercise: walking, Time (Minutes): 30, Frequency (Times/Week): 3, Weekly Exercise (Minutes/Week): 90, Intensity: Mild   Goals      Patient Stated   . Increase physical activity (pt-stated)     Walk up to 150 minutes per week for exercise (5 days, 30 minutes)       Fall Risk Fall Risk  10/31/2018 10/25/2017 08/15/2017 06/26/2017 05/22/2017  Falls in the past year? 0 No No  Yes Yes  Number falls in past yr: - - - - 2 or more  Injury with Fall? - - - No -  Risk Factor Category  - - - - High Fall Risk  Risk for fall due to : - - - - Impaired balance/gait  Follow up - - - - Falls prevention discussed   Depression Screen PHQ 2/9 Scores 10/31/2018 10/25/2017 08/15/2017 05/22/2017  PHQ - 2 Score 0 0 0 0     Cognitive Function     6CIT Screen 10/31/2018 10/25/2017  What Year? 0 points 0 points  What month? 0 points 0 points  What time? 0 points 0 points  Count back from 20 0 points 0 points  Months in reverse 0 points 0 points  Repeat phrase 0 points 0 points  Total Score 0 0    Immunization History  Administered Date(s) Administered  . Influenza, High Dose Seasonal PF 02/24/2017, 02/28/2018   Screening Tests Health Maintenance  Topic Date Due  . TETANUS/TDAP  05/09/2018  . OPHTHALMOLOGY EXAM  09/26/2018  . HEMOGLOBIN A1C  11/19/2018  . INFLUENZA VACCINE  12/08/2018  . FOOT EXAM  02/13/2019  . DEXA SCAN  Completed  . PNA vac Low Risk Adult  Completed      Plan:    End of life planning; Advance aging; Advanced directives discussed.  Copy of current HCPOA/Living Will requested.    I have personally reviewed and noted the following in the patient's chart:   . Medical and social history . Use of alcohol, tobacco or illicit drugs  . Current medications and supplements . Functional ability and status . Nutritional status . Physical activity . Advanced directives . List of other physicians . Hospitalizations, surgeries, and ER visits in previous 12 months . Vitals . Screenings to include cognitive, depression, and falls . Referrals and appointments  In addition, I have reviewed and discussed  with patient certain preventive protocols, quality metrics, and best practice recommendations. A written personalized care plan for preventive services as well as general preventive health recommendations were provided to patient.     Varney Biles, LPN  [id number]

## 2018-10-31 NOTE — Patient Instructions (Addendum)
  Kelly [name] , Thank you for taking time to come for your Medicare Wellness Visit. I appreciate your ongoing commitment to your health goals. Please review the following plan we discussed and let me know if I can assist you in the future.   These are the goals we discussed: Goals      Patient Stated   . Increase physical activity (pt-stated)     Walk up to 150 minutes per week for exercise (5 days, 30 minutes)       This is a list of the screening recommended for you and due dates:  Health Maintenance  Topic Date Due  . Tetanus Vaccine  05/09/2018  . Eye exam for diabetics  09/26/2018  . Hemoglobin A1C  11/19/2018  . Flu Shot  12/08/2018  . Complete foot exam   02/13/2019  . DEXA scan (bone density measurement)  Completed  . Pneumonia vaccines  Completed

## 2018-10-31 NOTE — Assessment & Plan Note (Addendum)
Adequately controlled.  Continue CPAP.

## 2018-10-31 NOTE — Progress Notes (Signed)
Virtual Visit via telephone Note  This visit type was conducted due to national recommendations for restrictions regarding the COVID-19 pandemic (e.g. social distancing).  This format is felt to be most appropriate for this patient at this time.  All issues noted in this document were discussed and addressed.  No physical exam was performed (except for noted visual exam findings with Video Visits).   I connected with Kelly [name] today at  9:30 AM EDT by telephone and verified that I am speaking with the correct person using two identifiers. Location patient: home Location provider: work  Persons participating in the virtual visit: patient, provider  I discussed the limitations, risks, security and privacy concerns of performing an evaluation and management service by telephone and the availability of in person appointments. I also discussed with the patient that there may be a patient responsible charge related to this service. The patient expressed understanding and agreed to proceed.  Interactive audio and video telecommunications were attempted between this provider and patient, however failed, due to patient having technical difficulties OR patient did not have access to video capability.  We continued and completed visit with audio only.   Reason for visit: Follow-up.  HPI: Cramps: Patient continues to have intermittent cramping.  She has tried Flexeril and does get some benefit from a whole tablet though it does make her drowsy.  She does continue on Lipitor.  Essential tremor: Patient notes she has had this for several years.  She feels as though it may be getting worse.  Typically shakes when she holds a newspaper though at times she will shake at rest.  She does not drink alcohol.  No walking abnormalities.  Diabetes: Typically running between 80 and 120.  Taking Victoza and basaglar 20 units daily.  No polyuria or polydipsia.  No hypoglycemia.  She is due to see  ophthalmology.  Hypertension: Not checking blood pressures.  Taking amlodipine,  Lasix, and losartan.  No chest pain, shortness breath, or edema.  OSA: Using her CPAP nightly.  Sometimes she will take off the CPAP in the middle of the night.  She does wake well rested.  No hypersomnia.   ROS: See pertinent positives and negatives per HPI.  Past Medical History:  Diagnosis Date  . Colon polyps   . Depression   . DM (diabetes mellitus), type 2 with renal complications ([place])   . Hyperlipidemia   . Hypertension   . Kidney stones   . Sleep apnea   . 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     Past Surgical History:  Procedure Laterality Date  . APPENDECTOMY    . BREAST BIOPSY Right 06/19/2018   affirm stereo/x clip/path pending  . cataract  03/2017  . CHOLECYSTECTOMY    . COLONOSCOPY    . COLONOSCOPY WITH PROPOFOL N/A 08/22/2017   Procedure: COLONOSCOPY WITH PROPOFOL;  Surgeon: Jonathon Bellows, MD;  [facility] ENDOSCOPY;  Service: Gastroenterology;  Laterality: N/A;  . ESOPHAGOGASTRODUODENOSCOPY    . ESOPHAGOGASTRODUODENOSCOPY (EGD) WITH PROPOFOL N/A 03/23/2016   Procedure: ESOPHAGOGASTRODUODENOSCOPY (EGD) WITH PROPOFOL;  Surgeon: Manya Silvas, MD;  Location: [facility] ENDOSCOPY;  Service: Endoscopy;  Laterality: N/A;  . FLEXIBLE SIGMOIDOSCOPY N/A 02/06/2018   Procedure: FLEXIBLE SIGMOIDOSCOPY;  Surgeon: Jonathon Bellows, MD;  Location[facility] ENDOSCOPY;  Service: Gastroenterology;  Laterality: N/A;  . RECTAL PROLAPSE REPAIR  2012   x2    Family History  Problem Relation Age of Onset  . Stroke Mother   .  Arthritis Mother   . Aortic aneurysm Sister   . Lung cancer Sister   . Heart attack Father 61  . Breast cancer Sister 45    SOCIAL HX: Non-smoker.   Current Outpatient Medications:  .  ALPRAZolam (XANAX) 0.5 MG tablet, Take 0.5 tablets (0.25 mg total) by mouth 2 (two) times daily as needed for anxiety., Disp: 30 tablet, Rfl: 0 .  amLODipine (NORVASC)  2.5 MG tablet, TAKE 1 TABLET DAILY, Disp: 90 tablet, Rfl: 3 .  aspirin EC 81 MG tablet, Take 81 mg by mouth daily., Disp: , Rfl:  .  atorvastatin (LIPITOR) 10 MG tablet, TAKE 1 TABLET DAILY, Disp: 90 tablet, Rfl: 1 .  cholecalciferol (VITAMIN D3) 25 MCG (1000 UT) tablet, Take 6,000 Units by mouth daily., Disp: , Rfl:  .  cyclobenzaprine (FLEXERIL) 10 MG tablet, TAKE 1/2 TABLET (5MG  TOTAL)3 TIMES A DAY AS NEEDED    FOR MUSCLE SPASMS, Disp: 45 tablet, Rfl: 0 .  denosumab (PROLIA) 60 MG/ML SOSY injection, Inject 60 mg into the skin every 6 (six) months., Disp: , Rfl:  .  ergocalciferol (VITAMIN D2) 1.25 MG (50000 UT) capsule, Take 50,000 Units by mouth once a week., Disp: , Rfl:  .  furosemide (LASIX) 40 MG tablet, Take 1 tablet (40 mg total) by mouth daily as needed., Disp: 90 tablet, Rfl: 0 .  gabapentin (NEURONTIN) 100 MG capsule, Take 100 mg by mouth 2 (two) times daily., Disp: , Rfl:  .  glucose blood (ACCU-CHEK AVIVA PLUS) test strip, Use to check blood sugar up to 4 times daily, Disp: 100 each, Rfl: 3 .  HYDROcodone-acetaminophen (NORCO) 5-325 MG tablet, Norco 5 mg-325 mg tablet  Take 1 tablet twice a day by oral route as needed., Disp: , Rfl:  .  Insulin Glargine (BASAGLAR KWIKPEN) 100 UNIT/ML SOPN, Inject 0.2 mLs (20 Units total) into the skin daily., Disp: 18 mL, Rfl: 1 .  Insulin Pen Needle (BD PEN NEEDLE NANO U/F) 32G X 4 MM MISC, USE DAILY WITH VICTOZA AND BASAGLAR, Disp: 180 each, Rfl: 2 .  Lancets MISC, Use up to 4 times daily to check blood sugars., Disp: 200 each, Rfl: 11 .  liraglutide (VICTOZA) 18 MG/3ML SOPN, Inject 0.3 mLs (1.8 mg total) into the skin daily., Disp: 27 mL, Rfl: 3 .  losartan (COZAAR) 50 MG tablet, TAKE 1 TABLET DAILY, Disp: 90 tablet, Rfl: 1 .  PARoxetine (PAXIL-CR) 25 MG 24 hr tablet, TAKE 1 TABLET DAILY, Disp: 90 tablet, Rfl: 3 .  potassium chloride (K-DUR) 10 MEQ tablet, potassium chloride ER 10 mEq tablet,extended release, Disp: , Rfl:   EXAM: This was a  telehealth telephone visit notes no physical exam was completed.  ASSESSMENT AND PLAN:  Discussed the following assessment and plan:  Hypertension Patient will check her blood pressures at home.  She will contact us with her readings.  She will continue her current medications.  She will come in for lab work.  OSA on CPAP Adequately controlled.  Continue CPAP.  DM (diabetes mellitus), type 2 with renal complications [facility]) She will continue her current regimen.  Check A1c.  Essential tremor Discussed referral to neurology though the patient opted to defer this to follow-up in 3 months and reevaluation at that time.  Muscle cramps We will have her discontinue her Lipitor.  If her cramps resolved she will let us know.  If they do not resolve she will restart the Lipitor.  Social distancing precautions and sick precautions given regarding COVID-19.  I discussed the assessment and treatment plan with the patient. The patient was provided an opportunity to ask questions and all were answered. The patient agreed with the plan and demonstrated an understanding of the instructions.   The patient was advised to call back or seek an in-person evaluation if the symptoms worsen or if the condition fails to improve as anticipated.  I provided 14 minutes of non-face-to-face time during this encounter.   Tommi Rumps, MD

## 2018-10-31 NOTE — Assessment & Plan Note (Signed)
Discussed referral to neurology though the patient opted to defer this to follow-up in 3 months and reevaluation at that time.

## 2018-10-31 NOTE — Assessment & Plan Note (Signed)
She will continue her current regimen.  Check A1c.

## 2018-10-31 NOTE — Assessment & Plan Note (Signed)
We will have her discontinue her Lipitor.  If her cramps resolved she will let us know.  If they do not resolve she will restart the Lipitor.

## 2018-11-01 NOTE — Progress Notes (Signed)
I have reviewed the above note and agree.  Jene Huq, M.D.  

## 2018-11-06 DIAGNOSIS — M545 Low back pain: Secondary | ICD-10-CM | POA: Diagnosis not present

## 2018-11-06 DIAGNOSIS — M25572 Pain in left ankle and joints of left foot: Secondary | ICD-10-CM | POA: Diagnosis not present

## 2018-11-06 DIAGNOSIS — M7062 Trochanteric bursitis, left hip: Secondary | ICD-10-CM | POA: Diagnosis not present

## 2018-11-06 DIAGNOSIS — M6281 Muscle weakness (generalized): Secondary | ICD-10-CM | POA: Diagnosis not present

## 2018-11-06 DIAGNOSIS — T2101XA Burn of unspecified degree of chest wall, initial encounter: Secondary | ICD-10-CM | POA: Diagnosis not present

## 2018-11-06 DIAGNOSIS — Z79891 Long term (current) use of opiate analgesic: Secondary | ICD-10-CM | POA: Diagnosis not present

## 2018-11-06 DIAGNOSIS — M25519 Pain in unspecified shoulder: Secondary | ICD-10-CM | POA: Diagnosis not present

## 2018-11-06 DIAGNOSIS — M5032 Other cervical disc degeneration, mid-cervical region, unspecified level: Secondary | ICD-10-CM | POA: Diagnosis not present

## 2018-11-06 DIAGNOSIS — E1165 Type 2 diabetes mellitus with hyperglycemia: Secondary | ICD-10-CM | POA: Diagnosis not present

## 2018-11-06 DIAGNOSIS — M5136 Other intervertebral disc degeneration, lumbar region: Secondary | ICD-10-CM | POA: Diagnosis not present

## 2018-11-06 DIAGNOSIS — Z79899 Other long term (current) drug therapy: Secondary | ICD-10-CM | POA: Diagnosis not present

## 2018-11-06 DIAGNOSIS — M542 Cervicalgia: Secondary | ICD-10-CM | POA: Diagnosis not present

## 2018-11-06 DIAGNOSIS — Q762 Congenital spondylolisthesis: Secondary | ICD-10-CM | POA: Diagnosis not present

## 2018-11-06 DIAGNOSIS — Z5181 Encounter for therapeutic drug level monitoring: Secondary | ICD-10-CM | POA: Diagnosis not present

## 2018-11-08 DIAGNOSIS — E1129 Type 2 diabetes mellitus with other diabetic kidney complication: Secondary | ICD-10-CM | POA: Diagnosis not present

## 2018-11-08 DIAGNOSIS — N184 Chronic kidney disease, stage 4 (severe): Secondary | ICD-10-CM | POA: Diagnosis not present

## 2018-11-08 DIAGNOSIS — I10 Essential (primary) hypertension: Secondary | ICD-10-CM | POA: Diagnosis not present

## 2018-11-15 ENCOUNTER — Ambulatory Visit: Payer: Self-pay | Admitting: Pharmacist

## 2018-11-15 DIAGNOSIS — I10 Essential (primary) hypertension: Secondary | ICD-10-CM

## 2018-11-15 DIAGNOSIS — G250 Essential tremor: Secondary | ICD-10-CM

## 2018-11-15 DIAGNOSIS — N184 Chronic kidney disease, stage 4 (severe): Secondary | ICD-10-CM

## 2018-11-15 DIAGNOSIS — Z794 Long term (current) use of insulin: Secondary | ICD-10-CM

## 2018-11-15 DIAGNOSIS — M810 Age-related osteoporosis without current pathological fracture: Secondary | ICD-10-CM

## 2018-11-15 DIAGNOSIS — E1122 Type 2 diabetes mellitus with diabetic chronic kidney disease: Secondary | ICD-10-CM

## 2018-11-15 NOTE — Progress Notes (Signed)
  Care Management Note   Kelly [name] is a 78 y.o. year old female who is a primary care patient of Kelly [name], Kelly Adam, MD. The CM team was consulted for assistance with chronic disease management and care coordination.   I reached out to [employer] by phone today. We discussed chronic care management services. She notes that she had labs drawn for her upcoming appointment with Kelly [name], but is unsure if they checked an A1c.   Kelly [name] was given information about Chronic Care Management services today including:  1. CCM service includes personalized support from designated clinical staff supervised by her physician, including individualized plan of care and coordination with other care providers 2. 24/7 contact phone numbers for assistance for urgent and routine care needs. 3. Service will only be billed when office clinical staff spend 20 minutes or more in a month to coordinate care. 4. Only one practitioner may furnish and bill the service in a calendar month. 5. The patient may stop CCM services at any time (effective at the end of the month) by phone call to the office staff. 6. The patient will be responsible for cost sharing (co-pay) of up to 20% of the service fee (after annual deductible is met). Patient agreed to services and verbal consent obtained.    Review of patient status, including review of consultants reports, relevant laboratory and other test results, and collaboration with appropriate care team members and the patient's provider was performed as part of comprehensive patient evaluation and provision of chronic care management services.   Follow Up Plan:  - Will outreach patient in the next 3-4 weeks to follow up on results of labwork, and provide medication management support  [name], PharmD, [place] Pharmacist [place] [phone]

## 2018-11-15 NOTE — Patient Instructions (Signed)
Visit Information  Ms. [place] was given information about Chronic Care Management services today including:  1. CCM service includes personalized support from designated clinical staff supervised by her physician, including individualized plan of care and coordination with other care providers 2. 24/7 contact phone numbers for assistance for urgent and routine care needs. 3. Service will only be billed when office clinical staff spend 20 minutes or more in a month to coordinate care. 4. Only one practitioner may furnish and bill the service in a calendar month. 5. The patient may stop CCM services at any time (effective at the end of the month) by phone call to the office staff. 6. The patient will be responsible for cost sharing (co-pay) of up to 20% of the service fee (after annual deductible is met).  Patient agreed to services and verbal consent obtained.   Print copy of patient instructions provided.   Follow Up Plan:  - Will outreach patient in the next 3-4 weeks to follow up on results of labwork, and provide medication management support  [name], PharmD, [place] Pharmacist [place] [phone]

## 2018-11-16 DIAGNOSIS — E1129 Type 2 diabetes mellitus with other diabetic kidney complication: Secondary | ICD-10-CM | POA: Diagnosis not present

## 2018-11-16 DIAGNOSIS — N2581 Secondary hyperparathyroidism of renal origin: Secondary | ICD-10-CM | POA: Diagnosis not present

## 2018-11-16 DIAGNOSIS — I129 Hypertensive chronic kidney disease with stage 1 through stage 4 chronic kidney disease, or unspecified chronic kidney disease: Secondary | ICD-10-CM | POA: Diagnosis not present

## 2018-11-16 DIAGNOSIS — N184 Chronic kidney disease, stage 4 (severe): Secondary | ICD-10-CM | POA: Diagnosis not present

## 2018-11-19 DIAGNOSIS — G4733 Obstructive sleep apnea (adult) (pediatric): Secondary | ICD-10-CM | POA: Diagnosis not present

## 2018-11-19 DIAGNOSIS — E1140 Type 2 diabetes mellitus with diabetic neuropathy, unspecified: Secondary | ICD-10-CM | POA: Diagnosis not present

## 2018-11-19 DIAGNOSIS — F5104 Psychophysiologic insomnia: Secondary | ICD-10-CM | POA: Diagnosis not present

## 2018-11-19 DIAGNOSIS — I10 Essential (primary) hypertension: Secondary | ICD-10-CM | POA: Diagnosis not present

## 2018-11-20 ENCOUNTER — Other Ambulatory Visit: Payer: Self-pay

## 2018-11-20 ENCOUNTER — Encounter: Payer: Self-pay | Admitting: Gastroenterology

## 2018-11-20 ENCOUNTER — Ambulatory Visit (INDEPENDENT_AMBULATORY_CARE_PROVIDER_SITE_OTHER): Payer: Medicare Other | Admitting: Gastroenterology

## 2018-11-20 VITALS — HR 85 | Temp 98.0°F | Ht 64.0 in | Wt 149.2 lb

## 2018-11-20 DIAGNOSIS — K5900 Constipation, unspecified: Secondary | ICD-10-CM

## 2018-11-20 DIAGNOSIS — K626 Ulcer of anus and rectum: Secondary | ICD-10-CM

## 2018-11-20 NOTE — Progress Notes (Signed)
Jonathon Bellows MD, MRCP(U.K) [address]  [place]  [place], [place] [address]  Main: [phone]  Fax: [phone]   Primary Care Physician: [name], MD  Primary Gastroenterologist:  Dr. Jonathon Bellows   No chief complaint on file.   HPI: Kelly [name] is a 78 y.o. female    Summary of history :  She is here to follow up for rectal bleeding and a rectal mass . She was initially referred and seen on 08/15/17 for rectal bleeding ongoing for several years.  08/22/17- Colonoscopy - 10 mm tubular adenoma excised in the ascending colon and a mass like lesion seen in the distal rectum close to the anus and bx showed no malignancy , was benign .  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 The polyp was sessile.Biopsy showed : FRAGMENTS OF POLYPOID REGENERATIVE COLONIC-TYPE MUCOSA WITH BANDS OF SMOOTH MUSCLE, AREAS OF SUPERFICIAL ULCERATION, AND FIBRIN CAPS. - SQUAMOUS EPITHELIUM IS NOT IDENTIFIED. - NEGATIVE FOR DYSPLASIA AND MALIGNANCY  RUQ USG shows CBD 7.4 mm and minimally lobulated contour of the liver , s/p cholecystectomy.  Interval history11/6/19 -11/20/2018 At her last visit was given Linzess samples which she took for a few days and stopped.  She states she takes stool softeners on and off.  Had one episode of rectal bleeding.  Current Outpatient Medications  Medication Sig Dispense Refill  . ALPRAZolam (XANAX) 0.5 MG tablet Take 0.5 tablets (0.25 mg total) by mouth 2 (two) times daily as needed for anxiety. 30 tablet 0  . amLODipine (NORVASC) 2.5 MG tablet TAKE 1 TABLET DAILY 90 tablet 3  . aspirin EC 81 MG tablet Take 81 mg by mouth daily.    Marland Kitchen atorvastatin (LIPITOR) 10 MG tablet TAKE 1 TABLET DAILY 90 tablet 1  .  cholecalciferol (VITAMIN D3) 25 MCG (1000 UT) tablet Take 6,000 Units by mouth daily.    . cyclobenzaprine (FLEXERIL) 10 MG tablet TAKE 1/2 TABLET (5MG  TOTAL)3 TIMES A DAY AS NEEDED    FOR MUSCLE SPASMS 45 tablet 0  . denosumab (PROLIA) 60 MG/ML SOSY injection Inject 60 mg into the skin every 6 (six) months.    . ergocalciferol (VITAMIN D2) 1.25 MG (50000 UT) capsule Take 50,000 Units by mouth once a week.    . furosemide (LASIX) 40 MG tablet Take 1 tablet (40 mg total) by mouth daily as needed. 90 tablet 0  . gabapentin (NEURONTIN) 100 MG capsule Take 100 mg by mouth 2 (two) times daily.    Marland Kitchen glucose blood (ACCU-CHEK AVIVA PLUS) test strip Use to check blood sugar up to 4 times daily 100 each 3  . HYDROcodone-acetaminophen (NORCO) 5-325 MG tablet Norco 5 mg-325 mg tablet  Take 1 tablet twice a day by oral route as needed.    . Insulin Glargine (BASAGLAR KWIKPEN) 100 UNIT/ML SOPN Inject 0.2 mLs (20 Units total) into the skin daily. 18 mL 1  . Insulin Pen Needle (BD PEN NEEDLE NANO U/F) 32G X 4 MM MISC USE DAILY WITH VICTOZA AND BASAGLAR 180 each 2  . Lancets MISC Use up to 4 times daily to check blood sugars. 200 each 11  . liraglutide (VICTOZA) 18 MG/3ML SOPN Inject 0.3 mLs (1.8 mg total) into the skin daily. 27 mL 3  . losartan (COZAAR)  50 MG tablet TAKE 1 TABLET DAILY 90 tablet 1  . PARoxetine (PAXIL-CR) 25 MG 24 hr tablet TAKE 1 TABLET DAILY 90 tablet 3  . potassium chloride (K-DUR) 10 MEQ tablet potassium chloride ER 10 mEq tablet,extended release     No current facility-administered medications for this visit.     Allergies as of 11/20/2018 - Review Complete 10/31/2018  Allergen Reaction Noted  . Prilosec otc [omeprazole magnesium] Other (See Comments) 07/20/2016  . Sucralfate Other (See Comments) 07/20/2016  . Amoxicillin Diarrhea 06/21/2015  . Morphine and related Other (See Comments) 06/21/2015    ROS:  General: Negative for anorexia, weight loss, fever, chills, fatigue,  weakness. ENT: Negative for hoarseness, difficulty swallowing , nasal congestion. CV: Negative for chest pain, angina, palpitations, dyspnea on exertion, peripheral edema.  Respiratory: Negative for dyspnea at rest, dyspnea on exertion, cough, sputum, wheezing.  GI: See history of present illness. GU:  Negative for dysuria, hematuria, urinary incontinence, urinary frequency, nocturnal urination.  Endo: Negative for unusual weight change.    Physical Examination:   There were no vitals taken for this visit.  General: Well-nourished, well-developed in no acute distress.  Eyes: No icterus. Conjunctivae pink. Mouth: Oropharyngeal mucosa moist and pink , no lesions erythema or exudate. Lungs: Clear to auscultation bilaterally. Non-labored. Heart: Regular rate and rhythm, no murmurs rubs or gallops.  Abdomen: Bowel sounds are normal, nontender, nondistended, no hepatosplenomegaly or masses, no abdominal bruits or hernia , no rebound or guarding.   Extremities: No lower extremity edema. No clubbing or deformities. Neuro: Alert and oriented x 3.  Grossly intact. Skin: Warm and dry, no jaundice.   Psych: Alert and cooperative, normal mood and affect.   Imaging Studies: No results found.  Assessment and Plan:   Kelly [name] is a 78 y.o. 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    Plan  1. Repeat Sigmoidoscopy  2.  Reiterated the need to continue stool softeners or Linzess on a regular basis otherwise the constipation will lead to solitary rectal ulcer syndrome- she will restart linzess  I have discussed alternative options, risks & benefits,  which include, but are not limited to, bleeding, infection, perforation,respiratory complication & drug reaction.  The patient agrees with this plan & written consent will be obtained.     Dr Jonathon Bellows  MD,MRCP [facility])  Follow up in 6 weeks

## 2018-11-21 DIAGNOSIS — Q762 Congenital spondylolisthesis: Secondary | ICD-10-CM | POA: Diagnosis not present

## 2018-11-21 DIAGNOSIS — E1165 Type 2 diabetes mellitus with hyperglycemia: Secondary | ICD-10-CM | POA: Diagnosis not present

## 2018-11-21 DIAGNOSIS — M25572 Pain in left ankle and joints of left foot: Secondary | ICD-10-CM | POA: Diagnosis not present

## 2018-11-21 DIAGNOSIS — T2101XA Burn of unspecified degree of chest wall, initial encounter: Secondary | ICD-10-CM | POA: Diagnosis not present

## 2018-11-21 DIAGNOSIS — Z79899 Other long term (current) drug therapy: Secondary | ICD-10-CM | POA: Diagnosis not present

## 2018-11-21 DIAGNOSIS — M6281 Muscle weakness (generalized): Secondary | ICD-10-CM | POA: Diagnosis not present

## 2018-11-21 DIAGNOSIS — M7062 Trochanteric bursitis, left hip: Secondary | ICD-10-CM | POA: Diagnosis not present

## 2018-11-21 DIAGNOSIS — M5136 Other intervertebral disc degeneration, lumbar region: Secondary | ICD-10-CM | POA: Diagnosis not present

## 2018-11-21 DIAGNOSIS — M542 Cervicalgia: Secondary | ICD-10-CM | POA: Diagnosis not present

## 2018-11-21 DIAGNOSIS — M545 Low back pain: Secondary | ICD-10-CM | POA: Diagnosis not present

## 2018-11-21 DIAGNOSIS — Z5181 Encounter for therapeutic drug level monitoring: Secondary | ICD-10-CM | POA: Diagnosis not present

## 2018-11-21 DIAGNOSIS — M25519 Pain in unspecified shoulder: Secondary | ICD-10-CM | POA: Diagnosis not present

## 2018-11-21 DIAGNOSIS — M5032 Other cervical disc degeneration, mid-cervical region, unspecified level: Secondary | ICD-10-CM | POA: Diagnosis not present

## 2018-11-21 DIAGNOSIS — Z79891 Long term (current) use of opiate analgesic: Secondary | ICD-10-CM | POA: Diagnosis not present

## 2018-11-26 ENCOUNTER — Telehealth: Payer: Self-pay | Admitting: Gastroenterology

## 2018-11-26 NOTE — Telephone Encounter (Signed)
Pt left vm she has a question regarding her Covid19 test on Thursday please call pt

## 2018-11-26 NOTE — Telephone Encounter (Signed)
Spoke with pt regarding her question about the Covid test and procedure prep instructions. I have reminded pt and resent the printed instructions.

## 2018-11-28 ENCOUNTER — Other Ambulatory Visit
Admission: RE | Admit: 2018-11-28 | Discharge: 2018-11-28 | Disposition: A | Discharge status: Home / Self Care | Payer: Medicare Other | Source: Ambulatory Visit | Attending: Gastroenterology | Admitting: Gastroenterology

## 2018-11-28 ENCOUNTER — Other Ambulatory Visit: Payer: Self-pay

## 2018-11-28 DIAGNOSIS — Z1159 Encounter for screening for other viral diseases: Secondary | ICD-10-CM | POA: Insufficient documentation

## 2018-11-28 LAB — SARS CORONAVIRUS 2 (TAT 6-24 HRS): SARS Coronavirus 2: NEGATIVE

## 2018-12-03 ENCOUNTER — Ambulatory Visit: Payer: Medicare Other | Admitting: Anesthesiology

## 2018-12-03 ENCOUNTER — Encounter
Admission: RE | Disposition: A | Discharge status: Home / Self Care | Payer: Self-pay | Source: Home / Self Care | Attending: Gastroenterology

## 2018-12-03 ENCOUNTER — Encounter: Payer: Self-pay | Admitting: *Deleted

## 2018-12-03 ENCOUNTER — Ambulatory Visit
Admission: RE | Admit: 2018-12-03 | Discharge: 2018-12-03 | Disposition: A | Discharge status: Home / Self Care | Payer: Medicare Other | Attending: Gastroenterology | Admitting: Gastroenterology

## 2018-12-03 ENCOUNTER — Other Ambulatory Visit: Payer: Self-pay | Admitting: Gastroenterology

## 2018-12-03 DIAGNOSIS — E785 Hyperlipidemia, unspecified: Secondary | ICD-10-CM | POA: Diagnosis not present

## 2018-12-03 DIAGNOSIS — F329 Major depressive disorder, single episode, unspecified: Secondary | ICD-10-CM | POA: Diagnosis not present

## 2018-12-03 DIAGNOSIS — G4730 Sleep apnea, unspecified: Secondary | ICD-10-CM | POA: Insufficient documentation

## 2018-12-03 DIAGNOSIS — K5900 Constipation, unspecified: Secondary | ICD-10-CM | POA: Insufficient documentation

## 2018-12-03 DIAGNOSIS — Z79899 Other long term (current) drug therapy: Secondary | ICD-10-CM | POA: Insufficient documentation

## 2018-12-03 DIAGNOSIS — Z794 Long term (current) use of insulin: Secondary | ICD-10-CM | POA: Insufficient documentation

## 2018-12-03 DIAGNOSIS — I252 Old myocardial infarction: Secondary | ICD-10-CM | POA: Diagnosis not present

## 2018-12-03 DIAGNOSIS — K626 Ulcer of anus and rectum: Secondary | ICD-10-CM | POA: Diagnosis not present

## 2018-12-03 DIAGNOSIS — N186 End stage renal disease: Secondary | ICD-10-CM | POA: Diagnosis not present

## 2018-12-03 DIAGNOSIS — E1122 Type 2 diabetes mellitus with diabetic chronic kidney disease: Secondary | ICD-10-CM | POA: Diagnosis not present

## 2018-12-03 DIAGNOSIS — I10 Essential (primary) hypertension: Secondary | ICD-10-CM | POA: Insufficient documentation

## 2018-12-03 DIAGNOSIS — E119 Type 2 diabetes mellitus without complications: Secondary | ICD-10-CM | POA: Diagnosis not present

## 2018-12-03 DIAGNOSIS — Z7982 Long term (current) use of aspirin: Secondary | ICD-10-CM | POA: Insufficient documentation

## 2018-12-03 DIAGNOSIS — Z8719 Personal history of other diseases of the digestive system: Secondary | ICD-10-CM | POA: Insufficient documentation

## 2018-12-03 DIAGNOSIS — I120 Hypertensive chronic kidney disease with stage 5 chronic kidney disease or end stage renal disease: Secondary | ICD-10-CM | POA: Diagnosis not present

## 2018-12-03 HISTORY — PX: FLEXIBLE SIGMOIDOSCOPY: SHX5431

## 2018-12-03 LAB — GLUCOSE, CAPILLARY: Glucose-Capillary: 110 mg/dL — ABNORMAL HIGH (ref 70–99)

## 2018-12-03 SURGERY — SIGMOIDOSCOPY, FLEXIBLE
Anesthesia: General

## 2018-12-03 MED ORDER — LIDOCAINE HCL (CARDIAC) PF 100 MG/5ML IV SOSY
PREFILLED_SYRINGE | INTRAVENOUS | Status: DC | PRN
  Administered 2018-12-03: 80 mg via INTRATRACHEAL

## 2018-12-03 MED ORDER — PROPOFOL 10 MG/ML IV BOLUS
INTRAVENOUS | Status: AC
  Filled 2018-12-03: qty 20

## 2018-12-03 MED ORDER — PROPOFOL 10 MG/ML IV BOLUS
INTRAVENOUS | Status: DC | PRN
  Administered 2018-12-03: 40 mg via INTRAVENOUS

## 2018-12-03 MED ORDER — PROPOFOL 500 MG/50ML IV EMUL
INTRAVENOUS | Status: DC | PRN
  Administered 2018-12-03: 100 ug/kg/min via INTRAVENOUS

## 2018-12-03 MED ORDER — SODIUM CHLORIDE 0.9 % IV SOLN
INTRAVENOUS | Status: DC
  Administered 2018-12-03: 10:00:00 via INTRAVENOUS

## 2018-12-03 MED ORDER — LIDOCAINE HCL (PF) 2 % IJ SOLN
INTRAMUSCULAR | Status: AC
  Filled 2018-12-03: qty 10

## 2018-12-03 NOTE — Anesthesia Postprocedure Evaluation (Signed)
Anesthesia Post Note  Patient: Kelly [name]  Procedure(s) Performed: FLEXIBLE SIGMOIDOSCOPY (N/A )  Patient location during evaluation: Endoscopy Anesthesia Type: General Level of consciousness: awake and alert Pain management: pain level controlled Vital Signs Assessment: post-procedure vital signs reviewed and stable Respiratory status: spontaneous breathing, nonlabored ventilation, respiratory function stable and patient connected to nasal cannula oxygen Cardiovascular status: blood pressure returned to baseline and stable Postop Assessment: no apparent nausea or vomiting Anesthetic complications: no     Last Vitals:  Vitals:   12/03/18 1012 12/03/18 1042  BP: (!) 131/58 (!) 166/71  Pulse: 80   Resp: (!) 23   Temp: (!) 36.3 C   SpO2: 98%     Last Pain:  Vitals:   12/03/18 1042  TempSrc:   PainSc: 0-No pain                 [name]

## 2018-12-03 NOTE — Op Note (Signed)
[facility] Gastroenterology Patient Name: Kelly [name] Procedure Date: 12/03/2018 9:57 AM MRN: [id number] Account #: [id number] Date of Birth: [birth year] Admit Type: Outpatient Age: 78 Room: [facility] ENDO ROOM 4 Gender: Female Note Status: Finalized Procedure:            Flexible Sigmoidoscopy Indications:          Follow-up for history of colon polyps of uncertain                        behavior Providers:            Jonathon Bellows MD, MD Referring MD:         Angela Adam. Caryl Bis (Referring MD) Medicines:            Monitored Anesthesia Care Complications:        No immediate complications. Procedure:            Pre-Anesthesia Assessment:                       - Prior to the procedure, a History and Physical was                        performed, and patient medications, allergies and                        sensitivities were reviewed. The patient's tolerance of                        previous anesthesia was reviewed.                       - The risks and benefits of the procedure and the                        sedation options and risks were discussed with the                        patient. All questions were answered and informed                        consent was obtained.                       - ASA Grade Assessment: II - A patient with mild                        systemic disease.                       After obtaining informed consent, the scope was passed                        under direct vision. The Colonoscope was introduced                        through the anus and advanced to the the left                        transverse colon. The flexible sigmoidoscopy was  accomplished with ease. The patient tolerated the                        procedure well. The quality of the bowel preparation                        was good. Findings:      The perianal and digital rectal examinations were normal.      A [Extent] area of moderately  {skip} mucosa was found in the rectum. ?       solitary recdtal ulcer syndrome- abnormal color.,texture, Biopsies were       taken with a cold forceps for histology.      The exam was otherwise without abnormality. Impression:           - Abnormal mucosa in the rectum. Biopsied.                       - The examination was otherwise normal. Recommendation:       - Discharge patient to home (with escort).                       - Discharge patient to home (with escort).                       - Resume previous diet.                       - Await pathology results. Procedure Code(s):    --- Professional ---                       [phone], Sigmoidoscopy, flexible; with biopsy, single or                        multiple Diagnosis Code(s):    --- Professional ---                       K62.89, Other specified diseases of anus and rectum                       Z86.03, Personal history of neoplasm of uncertain                        behavior CPT copyright 2019 American Medical Association. All rights reserved. The codes documented in this report are preliminary and upon coder review may  be revised to meet current compliance requirements. Jonathon Bellows, MD Jonathon Bellows MD, MD 12/03/2018 10:10:47 AM This report has been signed electronically. Number of Addenda: 0 Note Initiated On: 12/03/2018 9:57 AM Total Procedure Duration: 0 hours 5 minutes 46 seconds  Estimated Blood Loss: Estimated blood loss: none.      [facility]

## 2018-12-03 NOTE — H&P (Signed)
Jonathon Bellows, MD [address] [place], [place], [place], [address] [address], [place], [place], [place], [address] Phone: [phone]  Fax: [phone]  Primary Care Physician:  [name], MD   Pre-Procedure History & Physical: HPI:  Kelly [name] is a 78 y.o. female is here for a sigmoidoscopy    Past Medical History:  Diagnosis Date  . Colon polyps   . Depression   . DM (diabetes mellitus), type 2 with renal complications ([place])   . Hyperlipidemia   . Hypertension   . Kidney stones   . Sleep apnea   . 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     Past Surgical History:  Procedure Laterality Date  . APPENDECTOMY    . BREAST BIOPSY Right 06/19/2018   affirm stereo/x clip/path pending  . cataract  03/2017  . CHOLECYSTECTOMY    . COLONOSCOPY    . COLONOSCOPY WITH PROPOFOL N/A 08/22/2017   Procedure: COLONOSCOPY WITH PROPOFOL;  Surgeon: Jonathon Bellows, MD;  Location: [facility] ENDOSCOPY;  Service: Gastroenterology;  Laterality: N/A;  . ESOPHAGOGASTRODUODENOSCOPY    . ESOPHAGOGASTRODUODENOSCOPY (EGD) WITH PROPOFOL N/A 03/23/2016   Procedure: ESOPHAGOGASTRODUODENOSCOPY (EGD) WITH PROPOFOL;  Surgeon: Manya Silvas, MD;  Location: [facility] ENDOSCOPY;  Service: Endoscopy;  Laterality: N/A;  . FLEXIBLE SIGMOIDOSCOPY N/A 02/06/2018   Procedure: FLEXIBLE SIGMOIDOSCOPY;  Surgeon: Jonathon Bellows, MD;  Location: [facility] ENDOSCOPY;  Service: Gastroenterology;  Laterality: N/A;  . RECTAL PROLAPSE REPAIR  2012   x2    Prior to Admission medications   Medication Sig Start Date End Date Taking? Authorizing Provider  ALPRAZolam Duanne Moron) 0.5 MG tablet Take 0.5 tablets (0.25 mg total) by mouth 2 (two) times daily as needed for anxiety. 10/25/17  Yes [name], MD  amLODipine (NORVASC) 2.5 MG tablet TAKE 1 TABLET DAILY 05/25/18  Yes [name], MD  aspirin EC 81 MG tablet Take 81 mg by mouth daily.   Yes [provider]   atorvastatin (LIPITOR) 10 MG tablet TAKE 1 TABLET DAILY 09/24/18  Yes [name], MD  cholecalciferol (VITAMIN D3) 25 MCG (1000 UT) tablet Take 6,000 Units by mouth daily.   Yes [provider]  cyclobenzaprine (FLEXERIL) 10 MG tablet TAKE 1/2 TABLET (5MG  TOTAL)3 TIMES A DAY AS NEEDED    FOR MUSCLE SPASMS 09/24/18  Yes [name], MD  denosumab (PROLIA) 60 MG/ML SOSY injection Inject 60 mg into the skin every 6 (six) months.   Yes [provider]  ergocalciferol (VITAMIN D2) 1.25 MG (50000 UT) capsule Take 50,000 Units by mouth once a week.   Yes [provider]  furosemide (LASIX) 40 MG tablet Take 1 tablet (40 mg total) by mouth daily as needed. 01/11/17  Yes Cook, [name], DO  gabapentin (NEURONTIN) 100 MG capsule Take 100 mg by mouth 2 (two) times daily.   Yes [provider]  HYDROcodone-acetaminophen (NORCO) 5-325 MG tablet Norco 5 mg-325 mg tablet  Take 1 tablet twice a day by oral route as needed.   Yes [provider]  Insulin Glargine (BASAGLAR KWIKPEN) 100 UNIT/ML SOPN Inject 0.2 mLs (20 Units total) into the skin daily. 08/27/18  Yes [name], MD  liraglutide (VICTOZA) 18 MG/3ML SOPN Inject 0.3 mLs (1.8 mg total) into the skin daily. 11/06/17  Yes [name], MD  losartan (COZAAR) 50 MG tablet TAKE 1 TABLET DAILY 06/14/18  Yes [name],  MD  PARoxetine (PAXIL-CR) 25 MG 24 hr tablet TAKE 1 TABLET DAILY 05/25/18  Yes [name], MD  potassium chloride (K-DUR) 10 MEQ tablet potassium chloride ER 10 mEq tablet,extended release   Yes [provider]  glucose blood (ACCU-CHEK AVIVA PLUS) test strip Use to check blood sugar up to 4 times daily 07/30/18   [name], MD  Insulin Pen Needle (BD PEN NEEDLE NANO U/F) 32G X 4 MM MISC USE DAILY WITH VICTOZA AND BASAGLAR 09/24/18   [name], MD  Lancets MISC Use up to 4 times daily to check blood sugars. 11/04/16   Coral Spikes, DO     Allergies as of 11/21/2018 - Review Complete 11/20/2018  Allergen Reaction Noted  . Prilosec otc [omeprazole magnesium] Other (See Comments) 07/20/2016  . Sucralfate Other (See Comments) 07/20/2016  . Amoxicillin Diarrhea 06/21/2015  . Morphine and related Other (See Comments) 06/21/2015    Family History  Problem Relation Age of Onset  . Stroke Mother   . Arthritis Mother   . Aortic aneurysm Sister   . Lung cancer Sister   . Heart attack Father 54  . Breast cancer Sister 36    Social History   Socioeconomic History  . Marital status: Widowed    Spouse name: Not on file  . Number of children: Not on file  . Years of education: Not on file  . Highest education level: Not on file  Occupational History  . Not on file  Social Needs  . Financial resource strain: Not hard at all  . Food insecurity    Worry: Never true    Inability: Never true  . Transportation needs    Medical: No    Non-medical: No  Tobacco Use  . Smoking status: Never Smoker  . Smokeless tobacco: Never Used  Substance and Sexual Activity  . Alcohol use: No  . Drug use: No  . Sexual activity: Not Currently    Partners: Male  Lifestyle  . Physical activity    Days per week: 0 days    Minutes per session: Not on file  . Stress: Not at all  Relationships  . Social Herbalist on phone: Not on file    Gets together: Not on file    Attends religious service: Not on file    Active member of club or organization: Not on file    Attends meetings of clubs or organizations: Not on file    Relationship status: Not on file  . Intimate partner violence    Fear of current or ex partner: Not on file    Emotionally abused: Not on file    Physically abused: Not on file    Forced sexual activity: Not on file  Other Topics Concern  . Not on file  Social History Narrative  . Not on file    Review of Systems: See HPI, otherwise negative ROS  Physical Exam: BP (!) 145/77   Pulse 91   Temp 97.9  F (36.6 C) (Oral)   Resp 16   Ht 5\' 4"  (1.626 m)   Wt 67.1 kg   SpO2 98%   BMI 25.40 kg/m  General:   Alert,  pleasant and cooperative in NAD Head:  Normocephalic and atraumatic. Neck:  Supple; no masses or thyromegaly. Lungs:  Clear throughout to auscultation, normal respiratory effort.    Heart:  +S1, +S2, Regular rate and rhythm, No edema. Abdomen:  Soft, nontender and nondistended.  Normal bowel sounds, without guarding, and without rebound.   Neurologic:  Alert and  oriented x4;  grossly normal neurologically.  Impression/Plan: Lakiesha [name] is here for a sigmoidoscopy to be performed for abnormal appearing area in the rectum. Risks, benefits, limitations, and alternatives regarding  colonoscopy have been reviewed with the patient.  Questions have been answered.  All parties agreeable.   Jonathon Bellows, MD  12/03/2018, 9:42 AM

## 2018-12-03 NOTE — Anesthesia Preprocedure Evaluation (Signed)
Anesthesia Evaluation  Patient identified by MRN, date of birth, ID band Patient awake    Reviewed: Allergy & Precautions, H&P , NPO status , Patient's Chart, lab work & pertinent test results  History of Anesthesia Complications Negative for: history of anesthetic complications  Airway Mallampati: II  TM Distance: >3 FB Neck ROM: full    Dental  (+) Chipped   Pulmonary neg shortness of breath, sleep apnea ,           Cardiovascular Exercise Tolerance: Good hypertension, (-) angina(-) Past MI and (-) DOE      Neuro/Psych PSYCHIATRIC DISORDERS negative neurological ROS     GI/Hepatic negative GI ROS, Neg liver ROS, neg GERD  ,  Endo/Other  diabetes, Type 2, Insulin Dependent  Renal/GU ESRFRenal disease  negative genitourinary   Musculoskeletal   Abdominal   Peds  Hematology negative hematology ROS (+)   Anesthesia Other Findings Past Medical History: No date: Colon polyps No date: Depression No date: DM (diabetes mellitus), type 2 with renal complications ([facility]) No date: Hyperlipidemia No date: Hypertension No date: Kidney stones No date: Sleep apnea 12/2015: 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   Past Surgical History: No date: APPENDECTOMY 06/19/2018: BREAST BIOPSY; Right     Comment:  affirm stereo/x clip/path pending 03/2017: cataract No date: CHOLECYSTECTOMY No date: COLONOSCOPY 08/22/2017: COLONOSCOPY WITH PROPOFOL; N/A     Comment:  Procedure: COLONOSCOPY WITH PROPOFOL;  Surgeon: Jonathon Bellows, MD;  Location: [facility] ENDOSCOPY;  Service:               Gastroenterology;  Laterality: N/A; No date: ESOPHAGOGASTRODUODENOSCOPY 03/23/2016: ESOPHAGOGASTRODUODENOSCOPY (EGD) WITH PROPOFOL; N/A     Comment:  Procedure: ESOPHAGOGASTRODUODENOSCOPY (EGD) WITH               PROPOFOL;  Surgeon: Manya Silvas, MD;  Location: [facility]              ENDOSCOPY;  Service:  Endoscopy;  Laterality: N/A; 02/06/2018: FLEXIBLE SIGMOIDOSCOPY; N/A     Comment:  Procedure: FLEXIBLE SIGMOIDOSCOPY;  Surgeon: Jonathon Bellows, MD;  Location[facility] ENDOSCOPY;  Service:               Gastroenterology;  Laterality: N/A; 2012: RECTAL PROLAPSE REPAIR     Comment:  x2  BMI    Body Mass Index: 25.40 kg/m      Reproductive/Obstetrics negative OB ROS                             Anesthesia Physical Anesthesia Plan  ASA: III  Anesthesia Plan: General   Post-op Pain Management:    Induction: Intravenous  PONV Risk Score and Plan: Propofol infusion and TIVA  Airway Management Planned: Natural Airway and Nasal Cannula  Additional Equipment:   Intra-op Plan:   Post-operative Plan:   Informed Consent: I have reviewed the patients History and Physical, chart, labs and discussed the procedure including the risks, benefits and alternatives for the proposed anesthesia with the patient or authorized representative who has indicated his/her understanding and acceptance.     Dental Advisory Given  Plan Discussed with: Anesthesiologist, CRNA and Surgeon  Anesthesia Plan Comments: (Patient consented for risks of anesthesia including but not limited to:  - adverse reactions to  medications - risk of intubation if required - damage to teeth, lips or other oral mucosa - sore throat or hoarseness - Damage to heart, brain, lungs or loss of life  Patient voiced understanding.)        Anesthesia Quick Evaluation

## 2018-12-03 NOTE — Anesthesia Post-op Follow-up Note (Signed)
Anesthesia QCDR form completed.        

## 2018-12-03 NOTE — Transfer of Care (Signed)
Immediate Anesthesia Transfer of Care Note  Patient: Kelly [name]  Procedure(s) Performed: FLEXIBLE SIGMOIDOSCOPY (N/A )  Patient Location: PACU  Anesthesia Type:General  Level of Consciousness: awake, alert  and oriented  Airway & Oxygen Therapy: Patient Spontanous Breathing and Patient connected to nasal cannula oxygen  Post-op Assessment: Report given to RN, Post -op Vital signs reviewed and stable and Patient moving all extremities X 4  Post vital signs: Reviewed and stable  Last Vitals:  Vitals Value Taken Time  BP 131/58 12/03/18 1012  Temp 36.3 C 12/03/18 1012  Pulse 79 12/03/18 1015  Resp 16 12/03/18 1015  SpO2 99 % 12/03/18 1015  Vitals shown include unvalidated device data.  Last Pain:  Vitals:   12/03/18 1012  TempSrc: Tympanic  PainSc: 0-No pain         Complications: No apparent anesthesia complications

## 2018-12-04 LAB — SURGICAL PATHOLOGY

## 2018-12-06 ENCOUNTER — Ambulatory Visit: Payer: Medicare Other | Admitting: Pharmacist

## 2018-12-06 DIAGNOSIS — N184 Chronic kidney disease, stage 4 (severe): Secondary | ICD-10-CM

## 2018-12-06 DIAGNOSIS — I10 Essential (primary) hypertension: Secondary | ICD-10-CM

## 2018-12-06 DIAGNOSIS — E1122 Type 2 diabetes mellitus with diabetic chronic kidney disease: Secondary | ICD-10-CM

## 2018-12-06 LAB — PATHOLOGY

## 2018-12-06 NOTE — Chronic Care Management (AMB) (Signed)
Chronic Care Management   Note  12/06/2018 Name: Kelly [name] MRN: [id number] DOB: Dec 14, 1940   Subjective:  Kelly [name] is a 78 y.o. year old female who is a primary care patient of Caryl Bis, Angela Adam, MD. The CCM team was consulted for assistance with chronic disease management and care coordination needs.    Contacted patient telephonically for medication management support.   Review of patient status, including review of consultants reports, laboratory and other test data, was performed as part of comprehensive evaluation and provision of chronic care management services.   Objective:  Lab Results  Component Value Date   CREATININE 1.75 (H) 07/18/2018   CREATININE 1.76 (H) 07/17/2018   CREATININE 1.8 (A) 07/05/2018    Lab Results  Component Value Date   HGBA1C 8.3 (A) 05/21/2018       Component Value Date/Time   CHOL 133 08/02/2017 1022   TRIG 131.0 08/02/2017 1022   HDL 30.30 (L) 08/02/2017 1022   CHOLHDL 4 08/02/2017 1022   VLDL 26.2 08/02/2017 1022   LDLCALC 77 08/02/2017 1022    Clinical ASCVD: No  The 10-year ASCVD risk score Mikey Bussing DC Jr., et al., 2013) is: 59.7%   Values used to calculate the score:     Age: 43 years     Sex: Female     Is Non-Hispanic African American: No     Diabetic: Yes     Tobacco smoker: No     Systolic Blood Pressure: 924 mmHg     Is BP treated: Yes     HDL Cholesterol: 30.3 mg/dL     Total Cholesterol: 133 mg/dL    BP Readings from Last 3 Encounters:  12/03/18 (!) 166/71  07/17/18 128/76  07/09/18 105/67    Allergies  Allergen Reactions  . Prilosec Otc [Omeprazole Magnesium] Other (See Comments)    Maybe cause of acute interstitial nephritis  . Sucralfate Other (See Comments)    Maybe cause of acute interstitial nephritis  . Amoxicillin Diarrhea  . Morphine And Related Other (See Comments)    Chest pains Chest pains    Medications Reviewed Today    Reviewed by [name], [facility]  (Pharmacist) on 12/06/18 at [place] List Status: <None>  Medication Order Taking? Sig Documenting Provider Last Dose Status Informant  ALPRAZolam (XANAX) 0.5 MG tablet [id number] Yes Take 0.5 tablets (0.25 mg total) by mouth 2 (two) times daily as needed for anxiety. [name], MD Taking Active            Med Note Mayo Ao May 21, 2018  4:08 PM) Uses ~2x a month   amLODipine (NORVASC) 2.5 MG tablet [id number] Yes TAKE 1 TABLET DAILY [name], MD Taking Active   aspirin EC 81 MG tablet [id number] Yes Take 81 mg by mouth daily. [provider] Taking Active Multiple Informants  atorvastatin (LIPITOR) 10 MG tablet [id number] Yes TAKE 1 TABLET DAILY [name], MD Taking Active   cholecalciferol (VITAMIN D3) 25 MCG (1000 UT) tablet [id number] Yes Take 6,000 Units by mouth daily. [provider] Taking Active   cyclobenzaprine (FLEXERIL) 10 MG tablet [id number] No TAKE 1/2 TABLET (5MG  TOTAL)3 TIMES A DAY AS NEEDED    FOR MUSCLE SPASMS  Patient not taking: Reported on 12/06/2018   [name], MD Not Taking Active   denosumab (PROLIA) 60 MG/ML SOSY injection [id number] Yes Inject 60 mg into the skin every 6 (six) months. [provider] Taking Active            Med Note Sarajane Marek Sep 24, 2018  1:07 PM) Last dose in April         Discontinued 12/06/18 7416 (Completed Course)   furosemide (LASIX) 40 MG tablet [id number] No Take 1 tablet (40 mg total) by mouth daily as needed.  Patient not taking: Reported on 12/06/2018   Coral Spikes, DO Not Taking Active            Med Note Thurmond Butts, Collier [place] Nov 06, 2017 10:29 AM) Dewaine Conger very sporadically PRN lower extremity edema  gabapentin (NEURONTIN) 100 MG capsule [id number] Yes Take 100 mg by mouth 2 (two) times daily. [provider] Taking Active            Med Note [name], CATHERINE E   Thu Dec 06, 2018  9:18 AM) 1 daily   glucose blood (ACCU-CHEK AVIVA PLUS)  test strip [id number] Yes Use to check blood sugar up to 4 times daily [name], MD Taking Active   HYDROcodone-acetaminophen [facility]) 5-325 MG tablet [id number] Yes Norco 5 mg-325 mg tablet  Take 1 tablet twice a day by oral route as needed. [provider] Taking Active            Med Note Mayo Ao Feb 19, 2018  3:06 PM) Taking ~2x/week  Insulin [place]) 100 UNIT/ML SOPN [id number] Yes Inject 0.2 mLs (20 Units total) into the skin daily. [name], MD Taking Active   Insulin Pen Needle (BD PEN NEEDLE NANO U/F) 32G X 4 MM MISC [id number] Yes USE DAILY WITH VICTOZA AND [name], MD Taking Active   Lancets MISC [id number] Yes Use up to 4 times daily to check blood sugars. Coral Spikes, DO Taking Active   liraglutide (VICTOZA) 18 MG/3ML SOPN [id number] Yes Inject 0.3 mLs (1.8 mg total) into the skin daily. [name], MD Taking Active   losartan (COZAAR) 50 MG tablet [id number] Yes TAKE 1 TABLET DAILY [name], MD Taking Active   PARoxetine (PAXIL-CR) 25 MG 24 hr tablet [id number] Yes TAKE 1 TABLET DAILY [name], MD Taking Active   potassium chloride (K-DUR) 10 MEQ tablet [id number] No potassium chloride ER 10 mEq tablet,extended release [provider] Not Taking Active            Med Note Thurmond Butts, CAROLINE E   Mon Nov 06, 2017 10:32 AM) Only takes when she takes furosemide           Assessment:   Goals Addressed            This Visit's Progress     Patient Stated   . "I want to work on my diabetes" (pt-stated)       Current Barriers:  . Diabetes: uncontrolled but improved; most recent A1c 8.3%; due. Will discuss labwork with Dr. Caryl Bis.  o Notes dietary indiscretions recently explaining higher readings. . Current antihyperglycemic regimen:  . Denies hypoglycemic symptoms, including dizziness, lightheadedness, shaking, sweating; Denies hyperglycemic symptoms, including  polyuria, polydipsia, polyphagia, nocturia, blurred vision, neuropathy . Current meal patterns: o Breakfast: Special K w/ milk; or 2 eggs; sometimes PB sandwich w/ 2 pieces of bread; Premium Protein  o Lunch: Pimento cheese sandwich o Supper: Fish; frozen dinners  o Snacks: Yogurt o Drinks: Water, protein shake; occasional sodas  . Current exercise: Nothing currently .  Current blood glucose readings:   Fasting After Breakfast Before Lunch After Lunch Before Supper After Supper  29-Jul   143     28-Jul   148     27-Jul    80    15-Jul 103       14-Jul   116     16-Jul   172  92   17-Jul    145 114 206  18-Jul  162      19-Jul   196  130   20-Jul        21-Jul  301      22-Jul    253    23-Jul  164   184   24-Jul   150     25-Jul 120     224  Average 112 209 154 159 130 215   . Cardiovascular risk reduction: o Current hypertensive regimen: amlodipine 2.5 mg daily, losartan 50 mg daily; BP well controlled at office visits o Current hyperlipidemia regimen: atorvastatin 10 mg daily; LDL at goal <100 on last check; instructed to hold atorvastatin at last PCP visit due to muscle cramps, and to restart if cramps do not resolve. Cramps did not change, so patient restarted atorvastatin.    Pharmacist Clinical Goal(s):  Marland Kitchen Over the next 90 days, patient with work with PharmD and primary care provider to address optimized management of diabetes  Interventions: . Comprehensive medication review performed . Congratulated patient on continued focus on dietary monitoring, and realizing what types of foods raise her blood sugars more. Encouraged continued blood sugar monitoring after larger meals/snacks to provide feedback. Labwork was mentioned in Dr. Ellen Henri last visit note, none schedule and follow up appointment not schedule. Will collaborate with him to determine patient's follow up plan.  . Patient admitted she isn't checking BP; encouraged to start checking a few days per week to  ensure at goal at home.  . Praised patient for restarting atorvastatin, encouraged continued adherence.   Patient Self Care Activities:  . Patient will check blood glucose daily, document, and provide at future appointments . Patient will take medications as prescribed . Patient will contact provider with any episodes of hypoglycemia . Patient will report any questions or concerns to provider   Initial goal documentation        Plan: - Will collaborate with primary care provider on follow up as above.  - PharmD appointment with patient scheduled in ~4 weeks for continued medication management support  [name], PharmD, [place] Pharmacist [place] [phone]

## 2018-12-06 NOTE — Patient Instructions (Signed)
Visit Information  Goals Addressed            This Visit's Progress     Patient Stated   . "I want to work on my diabetes" (pt-stated)       Current Barriers:  . Diabetes: uncontrolled but improved; most recent A1c 8.3%; due. Will discuss labwork with Dr. Caryl Bis.  o Notes dietary indiscretions recently explaining higher readings. . Current antihyperglycemic regimen:  . Denies hypoglycemic symptoms, including dizziness, lightheadedness, shaking, sweating; Denies hyperglycemic symptoms, including polyuria, polydipsia, polyphagia, nocturia, blurred vision, neuropathy . Current meal patterns: o Breakfast: Special K w/ milk; or 2 eggs; sometimes PB sandwich w/ 2 pieces of bread; Premium Protein  o Lunch: Pimento cheese sandwich o Supper: Fish; frozen dinners  o Snacks: Yogurt o Drinks: Water, protein shake; occasional sodas  . Current exercise: Nothing currently . Current blood glucose readings:   Fasting After Breakfast Before Lunch After Lunch Before Supper After Supper  29-Jul   143     28-Jul   148     27-Jul    80    15-Jul 103       14-Jul   116     16-Jul   172  92   17-Jul    145 114 206  18-Jul  162      19-Jul   196  130   20-Jul        21-Jul  301      22-Jul    253    23-Jul  164   184   24-Jul   150     25-Jul 120     224  Average 112 209 154 159 130 215   . Cardiovascular risk reduction: o Current hypertensive regimen: amlodipine 2.5 mg daily, losartan 50 mg daily; BP well controlled at office visits o Current hyperlipidemia regimen: atorvastatin 10 mg daily; LDL at goal <100 on last check; instructed to hold atorvastatin at last PCP visit due to muscle cramps, and to restart if cramps do not resolve. Cramps did not change, so patient restarted atorvastatin.    Pharmacist Clinical Goal(s):  Marland Kitchen Over the next 90 days, patient with work with PharmD and primary care provider to address optimized management of diabetes  Interventions: . Comprehensive  medication review performed . Congratulated patient on continued focus on dietary monitoring, and realizing what types of foods raise her blood sugars more. Encouraged continued blood sugar monitoring after larger meals/snacks to provide feedback. Labwork was mentioned in Dr. Ellen Henri last visit note, none schedule and follow up appointment not schedule. Will collaborate with him to determine patient's follow up plan.  . Patient admitted she isn't checking BP; encouraged to start checking a few days per week to ensure at goal at home.  . Praised patient for restarting atorvastatin, encouraged continued adherence.   Patient Self Care Activities:  . Patient will check blood glucose daily, document, and provide at future appointments . Patient will take medications as prescribed . Patient will contact provider with any episodes of hypoglycemia . Patient will report any questions or concerns to provider   Initial goal documentation        The patient verbalized understanding of instructions provided today and declined a print copy of patient instruction materials.   Plan: - Will collaborate with primary care provider on follow up as above.  - PharmD appointment with patient scheduled in ~4 weeks for continued medication management support  [name], PharmD, CPP Clinical Pharmacist [place]  [place] [phone]

## 2018-12-09 ENCOUNTER — Telehealth: Payer: Self-pay | Admitting: Family Medicine

## 2018-12-09 NOTE — Telephone Encounter (Signed)
This patient is due for fasting lab work.  Could you please contact her to get her scheduled for labs sometime in the next several weeks?  Thanks.

## 2018-12-09 NOTE — Progress Notes (Signed)
I have reviewed the above note and agree. I was available to the pharmacist for consultation.  Labs ordered for patient.  Message sent to front office staff to schedule.  Tommi Rumps, MD

## 2018-12-09 NOTE — Addendum Note (Signed)
Addended by: [name] on: 12/09/2018 02:29 PM   Modules accepted: Orders

## 2018-12-11 ENCOUNTER — Telehealth: Payer: Self-pay

## 2018-12-11 NOTE — Telephone Encounter (Signed)
-----   Message from Jonathon Bellows, MD sent at 12/09/2018  1:53 PM EDT ----- Kelly [name] inform no features of cancer seen on the biopsy, still feel likely secondary to constipation. I do not think we need to biopsy this any more- we have confirned it on biopsies on multiple occasions. Will discuss further at upcoming office visit   Dr Jonathon Bellows MD,MRCP [facility]) Gastroenterology/Hepatology Pager: [phone]

## 2018-12-11 NOTE — Telephone Encounter (Signed)
Spoke with pt and informed her of biopsy results and Dr. Georgeann Oppenheim suggestions.

## 2018-12-23 ENCOUNTER — Encounter: Payer: Self-pay | Admitting: Gastroenterology

## 2018-12-24 ENCOUNTER — Other Ambulatory Visit (INDEPENDENT_AMBULATORY_CARE_PROVIDER_SITE_OTHER): Payer: Medicare Other

## 2018-12-24 ENCOUNTER — Other Ambulatory Visit: Payer: Self-pay

## 2018-12-24 DIAGNOSIS — N184 Chronic kidney disease, stage 4 (severe): Secondary | ICD-10-CM

## 2018-12-24 DIAGNOSIS — E1122 Type 2 diabetes mellitus with diabetic chronic kidney disease: Secondary | ICD-10-CM

## 2018-12-24 DIAGNOSIS — M810 Age-related osteoporosis without current pathological fracture: Secondary | ICD-10-CM

## 2018-12-24 DIAGNOSIS — I10 Essential (primary) hypertension: Secondary | ICD-10-CM

## 2018-12-24 DIAGNOSIS — Z794 Long term (current) use of insulin: Secondary | ICD-10-CM

## 2018-12-24 LAB — LIPID PANEL
Cholesterol: 142 mg/dL (ref 0–200)
HDL: 26.3 mg/dL — ABNORMAL LOW (ref >39.00)
LDL Cholesterol: 78 mg/dL (ref 0–99)
NonHDL: 115.58
Total CHOL/HDL Ratio: 5
Triglycerides: 187 mg/dL — ABNORMAL HIGH (ref 0.0–149.0)
VLDL: 37.4 mg/dL (ref 0.0–40.0)

## 2018-12-24 LAB — COMPREHENSIVE METABOLIC PANEL
ALT: 26 U/L (ref 0–35)
AST: 31 U/L (ref 0–37)
Albumin: 3.9 g/dL (ref 3.5–5.2)
Alkaline Phosphatase: 67 U/L (ref 39–117)
BUN: 16 mg/dL (ref 6–23)
CO2: 28 mEq/L (ref 19–32)
Calcium: 9.1 mg/dL (ref 8.4–10.5)
Chloride: 107 mEq/L (ref 96–112)
Creatinine, Ser: 2.03 mg/dL — ABNORMAL HIGH (ref 0.40–1.20)
GFR: 23.71 mL/min — ABNORMAL LOW (ref >60.00)
Glucose, Bld: 96 mg/dL (ref 70–99)
Potassium: 4.4 mEq/L (ref 3.5–5.1)
Sodium: 144 mEq/L (ref 135–145)
Total Bilirubin: 0.8 mg/dL (ref 0.2–1.2)
Total Protein: 6.3 g/dL (ref 6.0–8.3)

## 2018-12-24 LAB — VITAMIN D 25 HYDROXY (VIT D DEFICIENCY, FRACTURES): VITD: 111.67 ng/mL (ref 30.00–100.00)

## 2018-12-24 LAB — HEMOGLOBIN A1C: Hgb A1c MFr Bld: 7.4 % — ABNORMAL HIGH (ref 4.6–6.5)

## 2018-12-31 DIAGNOSIS — E119 Type 2 diabetes mellitus without complications: Secondary | ICD-10-CM | POA: Diagnosis not present

## 2018-12-31 DIAGNOSIS — H35372 Puckering of macula, left eye: Secondary | ICD-10-CM | POA: Diagnosis not present

## 2018-12-31 DIAGNOSIS — H04523 Eversion of bilateral lacrimal punctum: Secondary | ICD-10-CM | POA: Diagnosis not present

## 2018-12-31 LAB — HM DIABETES EYE EXAM

## 2019-01-01 ENCOUNTER — Ambulatory Visit (INDEPENDENT_AMBULATORY_CARE_PROVIDER_SITE_OTHER): Payer: Medicare Other | Admitting: Gastroenterology

## 2019-01-01 ENCOUNTER — Other Ambulatory Visit: Payer: Self-pay

## 2019-01-01 VITALS — BP 133/68 | HR 79 | Temp 97.7°F | Ht 64.0 in | Wt 145.6 lb

## 2019-01-01 DIAGNOSIS — K626 Ulcer of anus and rectum: Secondary | ICD-10-CM

## 2019-01-01 NOTE — Progress Notes (Signed)
Jonathon Bellows MD, MRCP(U.K) [address]  [place]  [place], [place] [address]  Main: [phone]  Fax: [phone]   Primary Care Physician: [name], MD  Primary Gastroenterologist:  Dr. Jonathon Bellows   No chief complaint on file.   HPI: Kelly [name] is a 78 y.o. female   Summary of history :  She is here to follow up for rectal bleeding and a rectal mass . She was initially referred and seen on 08/15/17 for rectal bleeding ongoing for several years.  08/22/17- Colonoscopy- 10 mm tubular adenoma excised in the ascending colon and a mass like lesion seen in the distal rectum close to the anus and bx showed no malignancy , was benign .  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 The polyp was sessile.Biopsy showed :FRAGMENTS OF POLYPOID REGENERATIVE COLONIC-TYPE MUCOSA WITH BANDS OF SMOOTH MUSCLE, AREAS OF SUPERFICIAL ULCERATION, AND FIBRIN CAPS. - SQUAMOUS EPITHELIUM IS NOT IDENTIFIED. - NEGATIVE FOR DYSPLASIA AND MALIGNANCY  RUQ USG shows CBD 7.4 mm and minimally lobulated contour of the liver , s/p cholecystectomy.  Interval history7/14/2020-01/01/2019  12/03/2018: Flexible sigmoidoscopy: Abnormal mucosa in the rectum.  Previously seen persisted no change since the last sigmoidoscopy.  Multiple biopsies were taken.  Negative for dysplasia or malignancy  New complaints since last visit.  Takes stool softeners daily but denies use of Linzess she states she still have the samples we gave her previously.  Denies any constipation.    Current Outpatient Medications  Medication Sig Dispense Refill  . ALPRAZolam (XANAX) 0.5 MG tablet Take 0.5 tablets (0.25 mg total) by mouth 2 (two) times daily as needed for anxiety. 30  tablet 0  . amLODipine (NORVASC) 2.5 MG tablet TAKE 1 TABLET DAILY 90 tablet 3  . aspirin EC 81 MG tablet Take 81 mg by mouth daily.    Marland Kitchen atorvastatin (LIPITOR) 10 MG tablet TAKE 1 TABLET DAILY 90 tablet 1  . cholecalciferol (VITAMIN D3) 25 MCG (1000 UT) tablet Take 6,000 Units by mouth daily.    . cyclobenzaprine (FLEXERIL) 10 MG tablet TAKE 1/2 TABLET (5MG  TOTAL)3 TIMES A DAY AS NEEDED    FOR MUSCLE SPASMS (Patient not taking: Reported on 12/06/2018) 45 tablet 0  . denosumab (PROLIA) 60 MG/ML SOSY injection Inject 60 mg into the skin every 6 (six) months.    . furosemide (LASIX) 40 MG tablet Take 1 tablet (40 mg total) by mouth daily as needed. (Patient not taking: Reported on 12/06/2018) 90 tablet 0  . gabapentin (NEURONTIN) 100 MG capsule Take 100 mg by mouth 2 (two) times daily.    Marland Kitchen glucose blood (ACCU-CHEK AVIVA PLUS) test strip Use to check blood sugar up to 4 times daily 100 each 3  . HYDROcodone-acetaminophen (NORCO) 5-325 MG tablet Norco 5 mg-325 mg tablet  Take 1 tablet twice a day by oral route as needed.    . Insulin Glargine (BASAGLAR KWIKPEN) 100 UNIT/ML SOPN Inject 0.2 mLs (20 Units total) into the skin daily. 18 mL 1  . Insulin Pen Needle (BD PEN NEEDLE NANO U/F) 32G X 4 MM MISC USE DAILY WITH VICTOZA AND BASAGLAR 180 each 2  . Lancets MISC Use up to 4 times daily to check blood sugars. 200 each 11  . liraglutide (VICTOZA) 18 MG/3ML SOPN Inject 0.3 mLs (  1.8 mg total) into the skin daily. 27 mL 3  . losartan (COZAAR) 50 MG tablet TAKE 1 TABLET DAILY 90 tablet 1  . PARoxetine (PAXIL-CR) 25 MG 24 hr tablet TAKE 1 TABLET DAILY 90 tablet 3  . potassium chloride (K-DUR) 10 MEQ tablet potassium chloride ER 10 mEq tablet,extended release     No current facility-administered medications for this visit.     Allergies as of 01/01/2019 - Review Complete 12/03/2018  Allergen Reaction Noted  . Prilosec otc [omeprazole magnesium] Other (See Comments) 07/20/2016  . Sucralfate Other (See  Comments) 07/20/2016  . Amoxicillin Diarrhea 06/21/2015  . Morphine and related Other (See Comments) 06/21/2015    ROS:  General: Negative for anorexia, weight loss, fever, chills, fatigue, weakness. ENT: Negative for hoarseness, difficulty swallowing , nasal congestion. CV: Negative for chest pain, angina, palpitations, dyspnea on exertion, peripheral edema.  Respiratory: Negative for dyspnea at rest, dyspnea on exertion, cough, sputum, wheezing.  GI: See history of present illness. GU:  Negative for dysuria, hematuria, urinary incontinence, urinary frequency, nocturnal urination.  Endo: Negative for unusual weight change.    Physical Examination:   There were no vitals taken for this visit.  General: Well-nourished, well-developed in no acute distress.  Eyes: No icterus. Conjunctivae pink. Mouth: Oropharyngeal mucosa moist and pink , no lesions erythema or exudate. Lungs: Clear to auscultation bilaterally. Non-labored. Heart: Regular rate and rhythm, no murmurs rubs or gallops.  Abdomen: Bowel sounds are normal, nontender, nondistended, no hepatosplenomegaly or masses, no abdominal bruits or hernia , no rebound or guarding.   Extremities: No lower extremity edema. No clubbing or deformities. Neuro: Alert and oriented x 3.  Grossly intact. Skin: Warm and dry, no jaundice.   Psych: Alert and cooperative, normal mood and affect.   Imaging Studies: No results found.  Assessment and Plan:   Kelly [name] is a 78 y.o. 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    Plan  1.  Informed the treatment for solitary rectal ulcer syndrome is to keep the stool soft.Cointinue stool softners and try to use linzess more often . I do not think there is any benefit on further endoscopic procedures as there is no evidence of malignancy on multiple biopsies.    Dr Jonathon Bellows  MD,MRCP [facility]) Follow up in as needed

## 2019-01-03 ENCOUNTER — Telehealth: Payer: Self-pay | Admitting: Pharmacist

## 2019-01-03 ENCOUNTER — Ambulatory Visit: Payer: Medicare Other | Admitting: Pharmacist

## 2019-01-03 DIAGNOSIS — E785 Hyperlipidemia, unspecified: Secondary | ICD-10-CM

## 2019-01-03 DIAGNOSIS — I10 Essential (primary) hypertension: Secondary | ICD-10-CM

## 2019-01-03 DIAGNOSIS — N184 Chronic kidney disease, stage 4 (severe): Secondary | ICD-10-CM

## 2019-01-03 DIAGNOSIS — Z794 Long term (current) use of insulin: Secondary | ICD-10-CM

## 2019-01-03 DIAGNOSIS — E1122 Type 2 diabetes mellitus with diabetic chronic kidney disease: Secondary | ICD-10-CM

## 2019-01-03 MED ORDER — ATORVASTATIN CALCIUM 20 MG PO TABS: 20.0000 mg | ORAL_TABLET | Freq: Every day | ORAL | 1 refills | Status: DC

## 2019-01-03 NOTE — Patient Instructions (Signed)
Visit Information  Goals Addressed            This Visit's Progress     Patient Stated   . "I want to work on my diabetes" (pt-stated)       Current Barriers:  . Diabetes: controlled at goal <7.5%; most recent A1c 7.4% o Notes she's lost 5 lbs through watching her diet . Current antihyperglycemic regimen: Victoza 1.8 mg daily, Basaglar 20 units  . Denies hypoglycemic symptoms, including dizziness, lightheadedness, shaking, sweating;  . Denies hyperglycemic symptoms, including polyuria, polydipsia, polyphagia, nocturia, blurred vision, neuropathy . Current meal patterns; avoiding fast food, eating fruit . Current exercise: Nothing currently . Current blood glucose readings:   Fasting  27-Aug 94  26-Aug 107  25-Aug 88  24-Aug 104  23-Aug 140  22-Aug 92  21-Aug 87  20-Aug 171  19-Aug 106  18-Aug 79  17-Aug 89  16-Aug 131  15-Aug 90  14-Aug 97  13-Aug 99  Average 105   . Cardiovascular risk reduction: o Current hypertensive regimen: amlodipine 2.5 mg daily, losartan 50 mg daily; BP well controlled at office visits; reports 2 home BP of 152/82 and 125/73; takes both medications around noon o Current hyperlipidemia regimen: atorvastatin 10 mg daily; LDL not at goal <70; Dr. Caryl Bis wanted to increase dose  Pharmacist Clinical Goal(s):  Marland Kitchen Over the next 90 days, patient with work with PharmD and primary care provider to address optimized management of diabetes  Interventions: . Comprehensive medication review performed; medication list updated in Epic.  Marland Kitchen Reiterated instruction from [place] to hold Vitamin D x1 week, then only take 1 tab daily. Patient verbalized understanding.  . Congratulated on fantastic improvement in A1c through focus on dietary changes and medication adherence.  . Discussed checking BP more frequently. Patient verbalized understanding . Discussed Dr. Ellen Henri recommendation to increase atorvastatin. Will start atorvastatin 20 mg daily. Will  collaborate with clinic staff to schedule patient for follow up lipid panel in 6-8 weeks. Patient counseled to contact clinic or me if she feels muscle side effects, rather than stopping the medication on her own. She verbalized understanding.  Patient Self Care Activities:  . Patient will check blood glucose daily, document, and provide at future appointments . Patient will check BP a few days a week, document, and provide at future appointments . Patient will take medications as prescribed . Patient will contact provider with any episodes of hypoglycemia . Patient will report any questions or concerns to provider   Please see past updates related to this goal by clicking on the "Past Updates" button in the selected goal         The patient verbalized understanding of instructions provided today and declined a print copy of patient instruction materials.   Plan:  - Will collaborate with clinic staff on scheduling lab work as above - Have scheduled appointment with patient in ~4 weeks for continued medication management support  [name], PharmD, [place] Pharmacist [facility] [phone]

## 2019-01-03 NOTE — Telephone Encounter (Signed)
Please schedule patient for fasting lipid panel in 6-8 weeks. Thanks!

## 2019-01-03 NOTE — Chronic Care Management (AMB) (Signed)
Chronic Care Management   Follow Up Note   01/03/2019 Name: Kelly [name] MRN: [id number] DOB: 06-26-1940  Referred by: [name], MD Reason for referral : Chronic Care Management (Medication Management)   Kelly [name] is a 78 y.o. year old female who is a primary care patient of Caryl Bis, Angela Adam, MD. The CCM team was consulted for assistance with chronic disease management and care coordination needs.    Spoke with patient regarding medication management support.   Review of patient status, including review of consultants reports, relevant laboratory and other test results, and collaboration with appropriate care team members and the patient's provider was performed as part of comprehensive patient evaluation and provision of chronic care management services.    SDOH (Social Determinants of Health) screening performed today: Financial Strain . See Care Plan for related entries.   Outpatient Encounter Medications as of 01/03/2019  Medication Sig Note  . ALPRAZolam (XANAX) 0.5 MG tablet Take 0.5 tablets (0.25 mg total) by mouth 2 (two) times daily as needed for anxiety. 05/21/2018: Uses ~2x a month   . amLODipine (NORVASC) 2.5 MG tablet TAKE 1 TABLET DAILY 01/03/2019: Lunchtime  . aspirin EC 81 MG tablet Take 81 mg by mouth daily.   Marland Kitchen denosumab (PROLIA) 60 MG/ML SOSY injection Inject 60 mg into the skin every 6 (six) months. 09/24/2018: Last dose in April   . gabapentin (NEURONTIN) 100 MG capsule Take 100 mg by mouth 2 (two) times daily. 12/06/2018: 1 daily   . glucose blood (ACCU-CHEK AVIVA PLUS) test strip Use to check blood sugar up to 4 times daily   . HYDROcodone-acetaminophen (NORCO) 5-325 MG tablet Norco 5 mg-325 mg tablet  Take 1 tablet twice a day by oral route as needed. 01/03/2019: PRN, 0-1 daily  . Insulin Glargine (BASAGLAR KWIKPEN) 100 UNIT/ML SOPN Inject 0.2 mLs (20 Units total) into the skin daily.   . Insulin Pen Needle (BD PEN NEEDLE NANO U/F) 32G  X 4 MM MISC USE DAILY WITH VICTOZA AND BASAGLAR   . Lancets MISC Use up to 4 times daily to check blood sugars.   . liraglutide (VICTOZA) 18 MG/3ML SOPN Inject 0.3 mLs (1.8 mg total) into the skin daily.   Marland Kitchen losartan (COZAAR) 50 MG tablet TAKE 1 TABLET DAILY   . PARoxetine (PAXIL-CR) 25 MG 24 hr tablet TAKE 1 TABLET DAILY   . [DISCONTINUED] atorvastatin (LIPITOR) 10 MG tablet TAKE 1 TABLET DAILY   . atorvastatin (LIPITOR) 20 MG tablet Take 1 tablet (20 mg total) by mouth daily.   . cholecalciferol (VITAMIN D3) 25 MCG (1000 UT) tablet Take 2,000 Units by mouth daily.    . cyclobenzaprine (FLEXERIL) 10 MG tablet TAKE 1/2 TABLET (5MG  TOTAL)3 TIMES A DAY AS NEEDED    FOR MUSCLE SPASMS (Patient not taking: Reported on 12/06/2018)   . furosemide (LASIX) 40 MG tablet Take 1 tablet (40 mg total) by mouth daily as needed. (Patient not taking: Reported on 12/06/2018) 11/06/2017: Takes very sporadically PRN lower extremity edema  . potassium chloride (K-DUR) 10 MEQ tablet potassium chloride ER 10 mEq tablet,extended release 11/06/2017: Only takes when she takes furosemide   No facility-administered encounter medications on file as of 01/03/2019.      Goals Addressed            This Visit's Progress     Patient Stated   . "I want to work on my diabetes" (pt-stated)       Current Barriers:  . Diabetes:  controlled at goal <7.5%; most recent A1c 7.4% o Notes she's lost 5 lbs through watching her diet . Current antihyperglycemic regimen: Victoza 1.8 mg daily, Basaglar 20 units  . Denies hypoglycemic symptoms, including dizziness, lightheadedness, shaking, sweating;  . Denies hyperglycemic symptoms, including polyuria, polydipsia, polyphagia, nocturia, blurred vision, neuropathy . Current meal patterns; avoiding fast food, eating fruit . Current exercise: Nothing currently . Current blood glucose readings:   Fasting  27-Aug 94  26-Aug 107  25-Aug 88  24-Aug 104  23-Aug 140  22-Aug 92  21-Aug 87   20-Aug 171  19-Aug 106  18-Aug 79  17-Aug 89  16-Aug 131  15-Aug 90  14-Aug 97  13-Aug 99  Average 105   . Cardiovascular risk reduction: o Current hypertensive regimen: amlodipine 2.5 mg daily, losartan 50 mg daily; BP well controlled at office visits; reports 2 home BP of 152/82 and 125/73; takes both medications around noon o Current hyperlipidemia regimen: atorvastatin 10 mg daily; LDL not at goal <70; Dr. Caryl Bis wanted to increase dose  Pharmacist Clinical Goal(s):  Marland Kitchen Over the next 90 days, patient with work with PharmD and primary care provider to address optimized management of diabetes  Interventions: . Comprehensive medication review performed; medication list updated in Epic.  Marland Kitchen Reiterated instruction from [place] to hold Vitamin D x1 week, then only take 1 tab daily. Patient verbalized understanding.  . Congratulated on fantastic improvement in A1c through focus on dietary changes and medication adherence.  . Discussed checking BP more frequently. Patient verbalized understanding . Discussed Dr. Ellen Henri recommendation to increase atorvastatin. Will start atorvastatin 20 mg daily. Will collaborate with clinic staff to schedule patient for follow up lipid panel in 6-8 weeks. Patient counseled to contact clinic or me if she feels muscle side effects, rather than stopping the medication on her own. She verbalized understanding.  Patient Self Care Activities:  . Patient will check blood glucose daily, document, and provide at future appointments . Patient will check BP a few days a week, document, and provide at future appointments . Patient will take medications as prescribed . Patient will contact provider with any episodes of hypoglycemia . Patient will report any questions or concerns to provider   Please see past updates related to this goal by clicking on the "Past Updates" button in the selected goal          Plan:  - Will collaborate with clinic staff  on scheduling lab work as above - Have scheduled appointment with patient in ~4 weeks for continued medication management support  [name], PharmD, [place] Pharmacist [employer] [phone]

## 2019-01-04 DIAGNOSIS — T2101XA Burn of unspecified degree of chest wall, initial encounter: Secondary | ICD-10-CM | POA: Diagnosis not present

## 2019-01-04 DIAGNOSIS — M545 Low back pain: Secondary | ICD-10-CM | POA: Diagnosis not present

## 2019-01-04 DIAGNOSIS — M25572 Pain in left ankle and joints of left foot: Secondary | ICD-10-CM | POA: Diagnosis not present

## 2019-01-04 DIAGNOSIS — M5032 Other cervical disc degeneration, mid-cervical region, unspecified level: Secondary | ICD-10-CM | POA: Diagnosis not present

## 2019-01-04 DIAGNOSIS — M25519 Pain in unspecified shoulder: Secondary | ICD-10-CM | POA: Diagnosis not present

## 2019-01-04 DIAGNOSIS — E1165 Type 2 diabetes mellitus with hyperglycemia: Secondary | ICD-10-CM | POA: Diagnosis not present

## 2019-01-04 DIAGNOSIS — M7062 Trochanteric bursitis, left hip: Secondary | ICD-10-CM | POA: Diagnosis not present

## 2019-01-04 DIAGNOSIS — M6281 Muscle weakness (generalized): Secondary | ICD-10-CM | POA: Diagnosis not present

## 2019-01-04 DIAGNOSIS — Q762 Congenital spondylolisthesis: Secondary | ICD-10-CM | POA: Diagnosis not present

## 2019-01-04 DIAGNOSIS — M5136 Other intervertebral disc degeneration, lumbar region: Secondary | ICD-10-CM | POA: Diagnosis not present

## 2019-01-04 DIAGNOSIS — Z79899 Other long term (current) drug therapy: Secondary | ICD-10-CM | POA: Diagnosis not present

## 2019-01-04 DIAGNOSIS — M542 Cervicalgia: Secondary | ICD-10-CM | POA: Diagnosis not present

## 2019-01-09 NOTE — Telephone Encounter (Signed)
Called and scheduled lab appt. And visit with the provider in October.  Nina,cma

## 2019-01-10 ENCOUNTER — Encounter: Payer: Self-pay | Admitting: *Deleted

## 2019-01-10 ENCOUNTER — Other Ambulatory Visit: Payer: Self-pay | Admitting: Family Medicine

## 2019-01-10 DIAGNOSIS — E559 Vitamin D deficiency, unspecified: Secondary | ICD-10-CM

## 2019-01-10 DIAGNOSIS — E785 Hyperlipidemia, unspecified: Secondary | ICD-10-CM

## 2019-01-10 MED ORDER — ATORVASTATIN CALCIUM 40 MG PO TABS: 40.0000 mg | ORAL_TABLET | Freq: Every day | ORAL | 1 refills | Status: DC

## 2019-01-11 ENCOUNTER — Other Ambulatory Visit: Payer: Self-pay | Admitting: Family Medicine

## 2019-01-21 DIAGNOSIS — F5104 Psychophysiologic insomnia: Secondary | ICD-10-CM | POA: Diagnosis not present

## 2019-01-21 DIAGNOSIS — G4733 Obstructive sleep apnea (adult) (pediatric): Secondary | ICD-10-CM | POA: Diagnosis not present

## 2019-01-30 DIAGNOSIS — L570 Actinic keratosis: Secondary | ICD-10-CM | POA: Diagnosis not present

## 2019-01-30 DIAGNOSIS — D2261 Melanocytic nevi of right upper limb, including shoulder: Secondary | ICD-10-CM | POA: Diagnosis not present

## 2019-01-30 DIAGNOSIS — X32XXXA Exposure to sunlight, initial encounter: Secondary | ICD-10-CM | POA: Diagnosis not present

## 2019-01-30 DIAGNOSIS — D225 Melanocytic nevi of trunk: Secondary | ICD-10-CM | POA: Diagnosis not present

## 2019-01-30 DIAGNOSIS — L821 Other seborrheic keratosis: Secondary | ICD-10-CM | POA: Diagnosis not present

## 2019-01-30 DIAGNOSIS — Z08 Encounter for follow-up examination after completed treatment for malignant neoplasm: Secondary | ICD-10-CM | POA: Diagnosis not present

## 2019-01-30 DIAGNOSIS — D2271 Melanocytic nevi of right lower limb, including hip: Secondary | ICD-10-CM | POA: Diagnosis not present

## 2019-01-30 DIAGNOSIS — D2272 Melanocytic nevi of left lower limb, including hip: Secondary | ICD-10-CM | POA: Diagnosis not present

## 2019-01-30 DIAGNOSIS — D2262 Melanocytic nevi of left upper limb, including shoulder: Secondary | ICD-10-CM | POA: Diagnosis not present

## 2019-01-30 DIAGNOSIS — Z85828 Personal history of other malignant neoplasm of skin: Secondary | ICD-10-CM | POA: Diagnosis not present

## 2019-01-30 DIAGNOSIS — L820 Inflamed seborrheic keratosis: Secondary | ICD-10-CM | POA: Diagnosis not present

## 2019-01-31 ENCOUNTER — Ambulatory Visit: Payer: Medicare Other | Admitting: Pharmacist

## 2019-01-31 DIAGNOSIS — E1122 Type 2 diabetes mellitus with diabetic chronic kidney disease: Secondary | ICD-10-CM

## 2019-01-31 DIAGNOSIS — N184 Chronic kidney disease, stage 4 (severe): Secondary | ICD-10-CM

## 2019-01-31 DIAGNOSIS — E785 Hyperlipidemia, unspecified: Secondary | ICD-10-CM

## 2019-01-31 NOTE — Chronic Care Management (AMB) (Signed)
Chronic Care Management   Follow Up Note   01/31/2019 Name: Kelly [name] MRN: [id number] DOB: 08/04/1940  Referred by: Kelly Haven, MD Reason for referral : Chronic Care Management (Medication Management)   Kelly [name] is a 78 y.o. year old female who is a primary care patient of Kelly [name], Kelly Adam, MD. The CCM team was consulted for assistance with chronic disease management and care coordination needs.    Contacted patient for medication management review today.   Review of patient status, including review of consultants reports, relevant laboratory and other test results, and collaboration with appropriate care team members and the patient's provider was performed as part of comprehensive patient evaluation and provision of chronic care management services.    SDOH (Social Determinants of Health) screening performed today: Physical Activity. See Care Plan for related entries.   Advanced Directives Status: N See Care Plan and [employer]yn[employer] application for related entries.  Outpatient Encounter Medications as of 01/31/2019  Medication Sig Note  . ALPRAZolam (XANAX) 0.5 MG tablet Take 0.5 tablets (0.25 mg total) by mouth 2 (two) times daily as needed for anxiety. 05/21/2018: Uses ~2x a month   . amLODipine (NORVASC) 2.5 MG tablet TAKE 1 TABLET DAILY 01/03/2019: Lunchtime  . aspirin EC 81 MG tablet Take 81 mg by mouth daily.   Kelly [name] atorvastatin (LIPITOR) 40 MG tablet Take 1 tablet (40 mg total) by mouth daily. 01/31/2019: Taking 2x 20 mg tab till finished  . cholecalciferol (VITAMIN D3) 25 MCG (1000 UT) tablet Take 1,000 Units by mouth daily.    . cyclobenzaprine (FLEXERIL) 10 MG tablet TAKE 1/2 TABLET (5MG  TOTAL)3 TIMES A DAY AS NEEDED    FOR MUSCLE SPASMS 01/31/2019: Using ~1/week for leg cramps  . denosumab (PROLIA) 60 MG/ML SOSY injection Inject 60 mg into the skin every 6 (six) months. 09/24/2018: Last dose in April   . gabapentin (NEURONTIN) 100 MG capsule Take 100 mg by  mouth 2 (two) times daily. 12/06/2018: 1 daily   . glucose blood (ACCU-CHEK AVIVA PLUS) test strip Use to check blood sugar up to 4 times daily   . HYDROcodone-acetaminophen (NORCO) 5-325 MG tablet Norco 5 mg-325 mg tablet  Take 1 tablet twice a day by oral route as needed. 01/03/2019: PRN, 0-1 daily  . Insulin Glargine (BASAGLAR KWIKPEN) 100 UNIT/ML SOPN Inject 0.2 mLs (20 Units total) into the skin daily.   . Insulin Pen Needle (BD PEN NEEDLE NANO U/F) 32G X 4 MM MISC USE DAILY WITH VICTOZA AND BASAGLAR   . Lancets MISC Use up to 4 times daily to check blood sugars.   Kelly [name] losartan (COZAAR) 50 MG tablet TAKE 1 TABLET DAILY   . PARoxetine (PAXIL-CR) 25 MG 24 hr tablet TAKE 1 TABLET DAILY   . VICTOZA 18 MG/3ML SOPN INJECT 0.3ML (1.8MG  TOTAL) SUBCUTANEOUSLY DAILY   . furosemide (LASIX) 40 MG tablet Take 1 tablet (40 mg total) by mouth daily as needed. (Patient not taking: Reported on 12/06/2018) 11/06/2017: Takes very sporadically PRN lower extremity edema  . potassium chloride (K-DUR) 10 MEQ tablet potassium chloride ER 10 mEq tablet,extended release 11/06/2017: Only takes when she takes furosemide   No facility-administered encounter medications on file as of 01/31/2019.      Goals Addressed            This Visit's Progress     Patient Stated   . "I want to work on my diabetes" (pt-stated)       Current Barriers:  .  Diabetes: controlled at goal <7.5%; most recent A1c 7.4% o Confirms she has f/u scheduled w/ Dr. Merita [name] on 11/10, labwork on 11/3 o Confirms Prolia injection scheduled 10/30 o Confirms she has increased atorvastatin to 40 mg daily.  . Current antihyperglycemic regimen: Victoza 1.8 mg daily, Basaglar 20 units  . Reports s/sx hypoglycemia (lightheadedness) with a fasting reading of 71 . Denies hyperglycemic symptoms, including polyuria, polydipsia, polyphagia, nocturia, blurred vision, neuropathy . Current exercise: Nothing formal, tries to remain fairly busy . Current blood glucose  readings: lowest 70; highest 226 in past 7 days; average 125;  . Cardiovascular risk reduction: o Current hypertensive regimen: amlodipine 2.5 mg daily, losartan 50 mg daily;  o Current hyperlipidemia regimen: atorvastatin 40 mg daily; on last check, Dr. Caryl [name] recommended increasing to 20 mg daily from 10 mg daily; I sent this in; then Dr. Caryl [name] increased to 40 mg daily.   Pharmacist Clinical Goal(s):  Kelly [name] Over the next 90 days, patient with work with PharmD and primary care provider to address optimized management of diabetes  Interventions: . Comprehensive medication review performed; medication list updated in Epic.  Kelly [name] Patient had a difficult time reviewing readings from her meter, and also is having a difficult time recognizing patterns. I will mail her a paper BG log for her to document after each check to allow for easier verbal review at follow up appointments . We also reviewed that she is past due on losartan refills. She confirms her current bottle was filled 10/06/2018, which she should have completed by now. She fills a daily pill box on a weekly basis. I will mail her an updated list of medications to ensure she is filling pill box appropriately daily  Patient Self Care Activities:  . Patient will check blood glucose daily, document, and provide at future appointments . Patient will check BP a few days a week, document, and provide at future appointments . Patient will take medications as prescribed . Patient will contact provider with any episodes of hypoglycemia . Patient will report any questions or concerns to provider   Please see past updates related to this goal by clicking on the "Past Updates" button in the selected goal          Plan:  - Mailing BG logs and updated medication list.  - Scheduled outreach in ~5 weeks after patient's nephrology f/u appointment.   [name], PharmD, [place] Pharmacist [facility] [place] [phone]

## 2019-01-31 NOTE — Patient Instructions (Addendum)
Visit Information  It was great to talk to you today!  The medications that should be in your pill box are:  - Amlodipine 2.5 mg daily - Aspirin 81 mg daily - Atorvastatin 40 mg daily (or two 20 mg tablets daily) - Losartan 50 mg daily  - Paroxetine 25 mg daily  Your other medications are used as needed.   Give me a call with any questions!  [name], PharmD [phone]    Goals Addressed            This Visit's Progress     Patient Stated   . "I want to work on my diabetes" (pt-stated)       Current Barriers:  . Diabetes: controlled at goal <7.5%; most recent A1c 7.4% o Confirms she has f/u scheduled w/ Dr. Merita Norton on 11/10, labwork on 11/3 o Confirms Prolia injection scheduled 10/30 o Confirms she has increased atorvastatin to 40 mg daily.  . Current antihyperglycemic regimen: Victoza 1.8 mg daily, Basaglar 20 units  . Reports s/sx hypoglycemia (lightheadedness) with a fasting reading of 71 . Denies hyperglycemic symptoms, including polyuria, polydipsia, polyphagia, nocturia, blurred vision, neuropathy . Current exercise: Nothing formal, tries to remain fairly busy . Current blood glucose readings: lowest 70; highest 226 in past 7 days; average 125;  . Cardiovascular risk reduction: o Current hypertensive regimen: amlodipine 2.5 mg daily, losartan 50 mg daily;  o Current hyperlipidemia regimen: atorvastatin 40 mg daily; on last check, Dr. Caryl Bis recommended increasing to 20 mg daily from 10 mg daily; I sent this in; then Dr. Caryl Bis increased to 40 mg daily.   Pharmacist Clinical Goal(s):  Marland Kitchen Over the next 90 days, patient with work with PharmD and primary care provider to address optimized management of diabetes  Interventions: . Comprehensive medication review performed; medication list updated in Epic.  Marland Kitchen Patient had a difficult time reviewing readings from her meter, and also is having a difficult time recognizing patterns. I will mail her a paper BG log  for her to document after each check to allow for easier verbal review at follow up appointments . We also reviewed that she is past due on losartan refills. She confirms her current bottle was filled 10/06/2018, which she should have completed by now. She fills a daily pill box on a weekly basis. I will mail her an updated list of medications to ensure she is filling pill box appropriately daily  Patient Self Care Activities:  . Patient will check blood glucose daily, document, and provide at future appointments . Patient will check BP a few days a week, document, and provide at future appointments . Patient will take medications as prescribed . Patient will contact provider with any episodes of hypoglycemia . Patient will report any questions or concerns to provider   Please see past updates related to this goal by clicking on the "Past Updates" button in the selected goal         The patient verbalized understanding of instructions provided today and declined a print copy of patient instruction materials.   Plan:  - Mailing BG logs and updated medication list.  - Scheduled outreach in ~5 weeks after patient's nephrology f/u appointment.   [name], PharmD, [place] Pharmacist [facility] [place] [phone]

## 2019-02-18 ENCOUNTER — Other Ambulatory Visit (INDEPENDENT_AMBULATORY_CARE_PROVIDER_SITE_OTHER): Payer: Medicare Other

## 2019-02-18 ENCOUNTER — Other Ambulatory Visit: Payer: Self-pay

## 2019-02-18 DIAGNOSIS — E559 Vitamin D deficiency, unspecified: Secondary | ICD-10-CM

## 2019-02-18 DIAGNOSIS — E785 Hyperlipidemia, unspecified: Secondary | ICD-10-CM

## 2019-02-18 LAB — LDL CHOLESTEROL, DIRECT: Direct LDL: 78 mg/dL

## 2019-02-18 LAB — HEPATIC FUNCTION PANEL
ALT: 26 U/L (ref 0–35)
AST: 30 U/L (ref 0–37)
Albumin: 3.8 g/dL (ref 3.5–5.2)
Alkaline Phosphatase: 75 U/L (ref 39–117)
Bilirubin, Direct: 0.1 mg/dL (ref 0.0–0.3)
Total Bilirubin: 0.6 mg/dL (ref 0.2–1.2)
Total Protein: 6.1 g/dL (ref 6.0–8.3)

## 2019-02-18 LAB — LIPID PANEL
Cholesterol: 146 mg/dL (ref 0–200)
HDL: 29.1 mg/dL — ABNORMAL LOW (ref >39.00)
NonHDL: 116.74
Total CHOL/HDL Ratio: 5
Triglycerides: 210 mg/dL — ABNORMAL HIGH (ref 0.0–149.0)
VLDL: 42 mg/dL — ABNORMAL HIGH (ref 0.0–40.0)

## 2019-02-18 LAB — VITAMIN D 25 HYDROXY (VIT D DEFICIENCY, FRACTURES): VITD: 76.94 ng/mL (ref 30.00–100.00)

## 2019-02-22 ENCOUNTER — Other Ambulatory Visit: Payer: Self-pay

## 2019-02-25 ENCOUNTER — Other Ambulatory Visit: Payer: Self-pay

## 2019-02-25 ENCOUNTER — Ambulatory Visit (INDEPENDENT_AMBULATORY_CARE_PROVIDER_SITE_OTHER): Payer: Medicare Other | Admitting: Family Medicine

## 2019-02-25 ENCOUNTER — Encounter: Payer: Self-pay | Admitting: Family Medicine

## 2019-02-25 VITALS — BP 130/70 | HR 82 | Temp 97.4°F | Ht 64.0 in | Wt 149.0 lb

## 2019-02-25 DIAGNOSIS — I10 Essential (primary) hypertension: Secondary | ICD-10-CM

## 2019-02-25 DIAGNOSIS — E785 Hyperlipidemia, unspecified: Secondary | ICD-10-CM

## 2019-02-25 DIAGNOSIS — Z794 Long term (current) use of insulin: Secondary | ICD-10-CM | POA: Diagnosis not present

## 2019-02-25 DIAGNOSIS — E1122 Type 2 diabetes mellitus with diabetic chronic kidney disease: Secondary | ICD-10-CM

## 2019-02-25 DIAGNOSIS — Z23 Encounter for immunization: Secondary | ICD-10-CM

## 2019-02-25 DIAGNOSIS — M810 Age-related osteoporosis without current pathological fracture: Secondary | ICD-10-CM | POA: Diagnosis not present

## 2019-02-25 DIAGNOSIS — N184 Chronic kidney disease, stage 4 (severe): Secondary | ICD-10-CM | POA: Diagnosis not present

## 2019-02-25 MED ORDER — ATORVASTATIN CALCIUM 80 MG PO TABS: 80.0000 mg | ORAL_TABLET | Freq: Every day | ORAL | 1 refills | Status: DC

## 2019-02-25 NOTE — Progress Notes (Signed)
  Tommi Rumps, MD Phone: [phone]  Kelly [name] is a 78 y.o. female who presents today for f/u.  HYPERTENSION Disease Monitoring: Blood pressure range-130s/70-80 Chest pain- no      Dyspnea- no Medications: Compliance- taking losartan, amlodipine   Edema- no  DIABETES Disease Monitoring: Blood Sugar ranges-80-120 Polyuria/phagia/dipsia- no      Optho- UTD Medications: Compliance- taking victoza, basaglar 20 u daily Hypoglycemic symptoms- no  HYPERLIPIDEMIA Disease Monitoring: See symptoms for Hypertension Medications: Compliance- taking lipitor 40 mg daily Right upper quadrant pain- no  Muscle aches- no  Osteoporosis: Patient is getting Prolia through her orthopedist.  She notes no fractures.  She does take vitamin D.  She recently had a vitamin D checked that was elevated.  She switched to taking just 1 vitamin D tablet daily and this has returned to normal.   Social History   Tobacco Use  Smoking Status Never Smoker  Smokeless Tobacco Never Used     ROS see history of present illness  Objective  Physical Exam Vitals:   02/25/19 1341  BP: 130/70  Pulse: 82  Temp: (!) 97.4 F (36.3 C)  SpO2: 94%    BP Readings from Last 3 Encounters:  02/25/19 130/70  01/01/19 133/68  12/03/18 (!) 166/71   Wt Readings from Last 3 Encounters:  02/25/19 149 lb (67.6 kg)  01/01/19 145 lb 9.6 oz (66 kg)  12/03/18 148 lb (67.1 kg)    Physical Exam Constitutional:      General: She is not in acute distress.    Appearance: She is not diaphoretic.  Cardiovascular:     Rate and Rhythm: Normal rate and regular rhythm.     Heart sounds: Normal heart sounds.  Pulmonary:     Effort: Pulmonary effort is normal.     Breath sounds: Normal breath sounds.  Musculoskeletal:     Right lower leg: No edema.     Left lower leg: No edema.  Skin:    General: Skin is warm and dry.  Neurological:     Mental Status: She is alert.      Assessment/Plan: Please see  individual problem list.  Hypertension Well-controlled for age.  Continue current regimen.  DM (diabetes mellitus), type 2 with renal complications (HCC) Improved control.  Check A1c with next set of labs.  Continue current medication.  CKD (chronic kidney disease) stage 4, GFR 15-29 ml/min (HCC) She will continue to see nephrology.  Senile osteoporosis Continue Prolia through orthopedics.  She will continue vitamin D.   Health Maintenance: She will get her tetanus vaccine when it is available at the pharmacy.  She is waiting on them to be able to do vaccines again.  Orders Placed This Encounter  Procedures  . Flu Vaccine QUAD High Dose(Fluad)  . HgB A1c    Standing Status:   Future    Standing Expiration Date:   02/25/2020  . Lipid panel    Standing Status:   Future    Standing Expiration Date:   02/25/2020  . Comp Met (CMET)    Standing Status:   Future    Standing Expiration Date:   02/25/2020    Meds ordered this encounter  Medications  . atorvastatin (LIPITOR) 80 MG tablet    Sig: Take 1 tablet (80 mg total) by mouth daily.    Dispense:  90 tablet    Refill:  1     Tommi Rumps, MD [place]

## 2019-02-25 NOTE — Patient Instructions (Signed)
Nice to see you. We will increase her Lipitor dose to 80 mg daily.  We will have you return for lab work in about 8 weeks.  Please continue your other medications.

## 2019-02-25 NOTE — Assessment & Plan Note (Addendum)
Improved control.  Check A1c with next set of labs.  Continue current medication.

## 2019-02-25 NOTE — Assessment & Plan Note (Signed)
She will continue to see nephrology.  

## 2019-02-25 NOTE — Assessment & Plan Note (Signed)
Continue Prolia through orthopedics.  She will continue vitamin D.

## 2019-02-25 NOTE — Assessment & Plan Note (Signed)
Well-controlled for age.  Continue current regimen.

## 2019-02-26 ENCOUNTER — Other Ambulatory Visit: Payer: Self-pay | Admitting: Family Medicine

## 2019-02-26 DIAGNOSIS — N184 Chronic kidney disease, stage 4 (severe): Secondary | ICD-10-CM

## 2019-02-26 DIAGNOSIS — E1122 Type 2 diabetes mellitus with diabetic chronic kidney disease: Secondary | ICD-10-CM

## 2019-03-08 DIAGNOSIS — M818 Other osteoporosis without current pathological fracture: Secondary | ICD-10-CM | POA: Diagnosis not present

## 2019-03-12 DIAGNOSIS — E1129 Type 2 diabetes mellitus with other diabetic kidney complication: Secondary | ICD-10-CM | POA: Diagnosis not present

## 2019-03-12 DIAGNOSIS — N184 Chronic kidney disease, stage 4 (severe): Secondary | ICD-10-CM | POA: Diagnosis not present

## 2019-03-12 DIAGNOSIS — I10 Essential (primary) hypertension: Secondary | ICD-10-CM | POA: Diagnosis not present

## 2019-03-12 DIAGNOSIS — N2581 Secondary hyperparathyroidism of renal origin: Secondary | ICD-10-CM | POA: Diagnosis not present

## 2019-03-14 DIAGNOSIS — R52 Pain, unspecified: Secondary | ICD-10-CM | POA: Diagnosis not present

## 2019-03-14 DIAGNOSIS — E1165 Type 2 diabetes mellitus with hyperglycemia: Secondary | ICD-10-CM | POA: Diagnosis not present

## 2019-03-14 DIAGNOSIS — M542 Cervicalgia: Secondary | ICD-10-CM | POA: Diagnosis not present

## 2019-03-14 DIAGNOSIS — T2101XA Burn of unspecified degree of chest wall, initial encounter: Secondary | ICD-10-CM | POA: Diagnosis not present

## 2019-03-14 DIAGNOSIS — Z79891 Long term (current) use of opiate analgesic: Secondary | ICD-10-CM | POA: Diagnosis not present

## 2019-03-14 DIAGNOSIS — M5032 Other cervical disc degeneration, mid-cervical region, unspecified level: Secondary | ICD-10-CM | POA: Diagnosis not present

## 2019-03-14 DIAGNOSIS — M6281 Muscle weakness (generalized): Secondary | ICD-10-CM | POA: Diagnosis not present

## 2019-03-14 DIAGNOSIS — Z5181 Encounter for therapeutic drug level monitoring: Secondary | ICD-10-CM | POA: Diagnosis not present

## 2019-03-14 DIAGNOSIS — Z79899 Other long term (current) drug therapy: Secondary | ICD-10-CM | POA: Diagnosis not present

## 2019-03-14 DIAGNOSIS — M7062 Trochanteric bursitis, left hip: Secondary | ICD-10-CM | POA: Diagnosis not present

## 2019-03-14 DIAGNOSIS — M25572 Pain in left ankle and joints of left foot: Secondary | ICD-10-CM | POA: Diagnosis not present

## 2019-03-14 DIAGNOSIS — M545 Low back pain: Secondary | ICD-10-CM | POA: Diagnosis not present

## 2019-03-14 DIAGNOSIS — M25519 Pain in unspecified shoulder: Secondary | ICD-10-CM | POA: Diagnosis not present

## 2019-03-14 DIAGNOSIS — Q762 Congenital spondylolisthesis: Secondary | ICD-10-CM | POA: Diagnosis not present

## 2019-03-14 DIAGNOSIS — M5136 Other intervertebral disc degeneration, lumbar region: Secondary | ICD-10-CM | POA: Diagnosis not present

## 2019-03-18 ENCOUNTER — Encounter: Payer: Self-pay | Admitting: Pharmacist

## 2019-03-18 ENCOUNTER — Ambulatory Visit: Payer: Self-pay | Admitting: Pharmacist

## 2019-03-18 DIAGNOSIS — N189 Chronic kidney disease, unspecified: Secondary | ICD-10-CM | POA: Diagnosis not present

## 2019-03-18 DIAGNOSIS — E1122 Type 2 diabetes mellitus with diabetic chronic kidney disease: Secondary | ICD-10-CM | POA: Diagnosis not present

## 2019-03-18 DIAGNOSIS — N184 Chronic kidney disease, stage 4 (severe): Secondary | ICD-10-CM | POA: Diagnosis not present

## 2019-03-18 DIAGNOSIS — N2581 Secondary hyperparathyroidism of renal origin: Secondary | ICD-10-CM | POA: Insufficient documentation

## 2019-03-18 DIAGNOSIS — E1159 Type 2 diabetes mellitus with other circulatory complications: Secondary | ICD-10-CM | POA: Insufficient documentation

## 2019-03-18 DIAGNOSIS — E118 Type 2 diabetes mellitus with unspecified complications: Secondary | ICD-10-CM | POA: Insufficient documentation

## 2019-03-18 DIAGNOSIS — I10 Essential (primary) hypertension: Secondary | ICD-10-CM | POA: Insufficient documentation

## 2019-03-18 DIAGNOSIS — E119 Type 2 diabetes mellitus without complications: Secondary | ICD-10-CM | POA: Insufficient documentation

## 2019-03-18 DIAGNOSIS — R600 Localized edema: Secondary | ICD-10-CM | POA: Diagnosis not present

## 2019-03-18 DIAGNOSIS — I129 Hypertensive chronic kidney disease with stage 1 through stage 4 chronic kidney disease, or unspecified chronic kidney disease: Secondary | ICD-10-CM | POA: Diagnosis not present

## 2019-03-18 NOTE — Progress Notes (Signed)
Reviewed.  Agree with plan   Dr Laquasia Pincus 

## 2019-03-18 NOTE — Chronic Care Management (AMB) (Signed)
Chronic Care Management   Follow Up Note   03/18/2019 Name: Kelly [name] MRN: [id number] DOB: 10-30-1940  Referred by: [name], MD Reason for referral : Chronic Care Management (Medication Management)   Lorene GARLENE APPERSON is a 78 y.o. year old female who is a primary care patient of Caryl Bis, Angela Adam, MD. The CCM team was consulted for assistance with chronic disease management and care coordination needs.    Met with patient face to face for care coordination today.   Review of patient status, including review of consultants reports, relevant laboratory and other test results, and collaboration with appropriate care team members and the patient's provider was performed as part of comprehensive patient evaluation and provision of chronic care management services.    SDOH (Social Determinants of Health) screening performed today: None. See Care Plan for related entries.   Outpatient Encounter Medications as of 03/18/2019  Medication Sig Note  . ALPRAZolam (XANAX) 0.5 MG tablet Take 0.5 tablets (0.25 mg total) by mouth 2 (two) times daily as needed for anxiety. 05/21/2018: Uses ~2x a month   . amLODipine (NORVASC) 2.5 MG tablet TAKE 1 TABLET DAILY 01/03/2019: Lunchtime  . aspirin EC 81 MG tablet Take 81 mg by mouth daily.   Marland Kitchen atorvastatin (LIPITOR) 80 MG tablet Take 1 tablet (80 mg total) by mouth daily.   . cholecalciferol (VITAMIN D3) 25 MCG (1000 UT) tablet Take 1,000 Units by mouth daily.    . cyclobenzaprine (FLEXERIL) 10 MG tablet TAKE 1/2 TABLET (5MG TOTAL)3 TIMES A DAY AS NEEDED    FOR MUSCLE SPASMS 01/31/2019: Using ~1/week for leg cramps  . denosumab (PROLIA) 60 MG/ML SOSY injection Inject 60 mg into the skin every 6 (six) months. 09/24/2018: Last dose in April   . furosemide (LASIX) 40 MG tablet Take 1 tablet (40 mg total) by mouth daily as needed. 11/06/2017: Takes very sporadically PRN lower extremity edema  . gabapentin (NEURONTIN) 100 MG capsule Take 100 mg  by mouth 2 (two) times daily. 12/06/2018: 1 daily   . glucose blood (ACCU-CHEK AVIVA PLUS) test strip Use to check blood sugar up to 4 times daily   . HYDROcodone-acetaminophen (NORCO) 5-325 MG tablet Norco 5 mg-325 mg tablet  Take 1 tablet twice a day by oral route as needed. 01/03/2019: PRN, 0-1 daily  . Insulin Glargine (BASAGLAR KWIKPEN) 100 UNIT/ML SOPN INJECT 20 UNITS TOTAL      SUBCUTANEOUSLY DAILY   . Insulin Pen Needle (BD PEN NEEDLE NANO U/F) 32G X 4 MM MISC USE DAILY WITH VICTOZA AND BASAGLAR   . Lancets MISC Use up to 4 times daily to check blood sugars.   Marland Kitchen losartan (COZAAR) 50 MG tablet TAKE 1 TABLET DAILY   . PARoxetine (PAXIL-CR) 25 MG 24 hr tablet TAKE 1 TABLET DAILY   . potassium chloride (K-DUR) 10 MEQ tablet potassium chloride ER 10 mEq tablet,extended release 11/06/2017: Only takes when she takes furosemide  . VICTOZA 18 MG/3ML SOPN INJECT 0.3ML (1.8MG TOTAL) SUBCUTANEOUSLY DAILY    No facility-administered encounter medications on file as of 03/18/2019.      Goals Addressed            This Visit's Progress     Patient Stated   . "I want to work on my diabetes" (pt-stated)       Current Barriers:  . Diabetes: controlled at goal <7.5%; most recent A1c 7.4% o Come to clinic today with a form needed to be able to receive a  BP cuff for free from her insurance company  . Current antihyperglycemic regimen: Victoza 1.8 mg daily, Basaglar 20 units  . Cardiovascular risk reduction: o Current hypertensive regimen: amlodipine 2.5 mg daily, losartan 50 mg daily;  o Current hyperlipidemia regimen: atorvastatin 80 mg daily (just increased by Dr. Caryl Bis)  Pharmacist Clinical Goal(s):  Marland Kitchen Over the next 90 days, patient with work with PharmD and primary care provider to address optimized management of diabetes  Interventions: . Assisted patient in completing form to obtain BP cuff. Collaborated w/ primary care provider for his signature.   Patient Self Care Activities:  .  Patient will check blood glucose daily, document, and provide at future appointments . Patient will check BP a few days a week, document, and provide at future appointments . Patient will take medications as prescribed . Patient will contact provider with any episodes of hypoglycemia . Patient will report any questions or concerns to provider   Please see past updates related to this goal by clicking on the "Past Updates" button in the selected goal         Plan:  - Will outreach patient for medication management as scheduled  [name], PharmD, [place] Pharmacist [place] [phone]

## 2019-03-18 NOTE — Patient Instructions (Signed)
Visit Information  Goals Addressed            This Visit's Progress     Patient Stated   . "I want to work on my diabetes" (pt-stated)       Current Barriers:  . Diabetes: controlled at goal <7.5%; most recent A1c 7.4% o Come to clinic today with a form needed to be able to receive a BP cuff for free from her insurance company  . Current antihyperglycemic regimen: Victoza 1.8 mg daily, Basaglar 20 units  . Cardiovascular risk reduction: o Current hypertensive regimen: amlodipine 2.5 mg daily, losartan 50 mg daily;  o Current hyperlipidemia regimen: atorvastatin 80 mg daily (just increased by Dr. Caryl Bis)  Pharmacist Clinical Goal(s):  Marland Kitchen Over the next 90 days, patient with work with PharmD and primary care provider to address optimized management of diabetes  Interventions: . Assisted patient in completing form to obtain BP cuff. Collaborated w/ primary care provider for his signature.   Patient Self Care Activities:  . Patient will check blood glucose daily, document, and provide at future appointments . Patient will check BP a few days a week, document, and provide at future appointments . Patient will take medications as prescribed . Patient will contact provider with any episodes of hypoglycemia . Patient will report any questions or concerns to provider   Please see past updates related to this goal by clicking on the "Past Updates" button in the selected goal         The patient verbalized understanding of instructions provided today and declined a print copy of patient instruction materials.   Plan:  - Will outreach patient for medication management as scheduled  [name], PharmD, [place] Pharmacist [facility] [place] [phone]

## 2019-03-19 NOTE — Progress Notes (Signed)
I returned the form to Catie, Catie I did not see this message to fax until today, so I wanted you to know I did not fax it, if you want I can fax it, sorry for the confusion.  Nansi Birmingham,cma

## 2019-03-20 ENCOUNTER — Telehealth: Payer: Self-pay | Admitting: Pharmacist

## 2019-03-20 NOTE — Telephone Encounter (Signed)
Pt called stating she will not be able to make her phone appt for 11/12 @ 2pm due to having to take her dog to the vet to get the stitches out. I rescheduled pt to 03/25/2019 @ 12:30p.

## 2019-03-21 ENCOUNTER — Telehealth: Payer: Medicare Other

## 2019-03-25 ENCOUNTER — Ambulatory Visit (INDEPENDENT_AMBULATORY_CARE_PROVIDER_SITE_OTHER): Payer: Medicare Other | Admitting: Pharmacist

## 2019-03-25 DIAGNOSIS — Z794 Long term (current) use of insulin: Secondary | ICD-10-CM | POA: Diagnosis not present

## 2019-03-25 DIAGNOSIS — E1122 Type 2 diabetes mellitus with diabetic chronic kidney disease: Secondary | ICD-10-CM | POA: Diagnosis not present

## 2019-03-25 DIAGNOSIS — I10 Essential (primary) hypertension: Secondary | ICD-10-CM

## 2019-03-25 DIAGNOSIS — N184 Chronic kidney disease, stage 4 (severe): Secondary | ICD-10-CM

## 2019-03-25 NOTE — Chronic Care Management (AMB) (Signed)
Chronic Care Management   Follow Up Note   03/25/2019 Name: Kelly [name] MRN: [id number] DOB: 05-06-1941  Referred by: [name], MD Reason for referral : Chronic Care Management (Medication Management)   Kelly [name] is a 78 y.o. year old female who is a primary care patient of Caryl Bis, Angela Adam, MD. The CCM team was consulted for assistance with chronic disease management and care coordination needs.    Contacted patient telephonically for medication management review.   Review of patient status, including review of consultants reports, relevant laboratory and other test results, and collaboration with appropriate care team members and the patient's provider was performed as part of comprehensive patient evaluation and provision of chronic care management services.    SDOH (Social Determinants of Health) screening performed today: None. See Care Plan for related entries.   Outpatient Encounter Medications as of 03/25/2019  Medication Sig Note  . amLODipine (NORVASC) 2.5 MG tablet TAKE 1 TABLET DAILY 01/03/2019: Lunchtime  . atorvastatin (LIPITOR) 80 MG tablet Take 1 tablet (80 mg total) by mouth daily.   . calcitRIOL (ROCALTROL) 0.25 MCG capsule Take 0.25 mcg by mouth daily.   . furosemide (LASIX) 40 MG tablet Take 1 tablet (40 mg total) by mouth daily as needed. 11/06/2017: Takes very sporadically PRN lower extremity edema  . gabapentin (NEURONTIN) 100 MG capsule Take 100 mg by mouth 2 (two) times daily. 12/06/2018: 1 daily   . Insulin Glargine (BASAGLAR KWIKPEN) 100 UNIT/ML SOPN INJECT 20 UNITS TOTAL      SUBCUTANEOUSLY DAILY   . VICTOZA 18 MG/3ML SOPN INJECT 0.3ML (1.8MG  TOTAL) SUBCUTANEOUSLY DAILY   . ALPRAZolam (XANAX) 0.5 MG tablet Take 0.5 tablets (0.25 mg total) by mouth 2 (two) times daily as needed for anxiety. 05/21/2018: Uses ~2x a month   . aspirin EC 81 MG tablet Take 81 mg by mouth daily.   . cyclobenzaprine (FLEXERIL) 10 MG tablet TAKE 1/2  TABLET (5MG  TOTAL)3 TIMES A DAY AS NEEDED    FOR MUSCLE SPASMS 01/31/2019: Using ~1/week for leg cramps  . denosumab (PROLIA) 60 MG/ML SOSY injection Inject 60 mg into the skin every 6 (six) months. 09/24/2018: Last dose in April   . glucose blood (ACCU-CHEK AVIVA PLUS) test strip Use to check blood sugar up to 4 times daily   . HYDROcodone-acetaminophen (NORCO) 5-325 MG tablet Norco 5 mg-325 mg tablet  Take 1 tablet twice a day by oral route as needed. 01/03/2019: PRN, 0-1 daily  . Insulin Pen Needle (BD PEN NEEDLE NANO U/F) 32G X 4 MM MISC USE DAILY WITH VICTOZA AND BASAGLAR   . Lancets MISC Use up to 4 times daily to check blood sugars.   Marland Kitchen losartan (COZAAR) 50 MG tablet TAKE 1 TABLET DAILY   . PARoxetine (PAXIL-CR) 25 MG 24 hr tablet TAKE 1 TABLET DAILY   . potassium chloride (K-DUR) 10 MEQ tablet potassium chloride ER 10 mEq tablet,extended release 11/06/2017: Only takes when she takes furosemide  . [DISCONTINUED] cholecalciferol (VITAMIN D3) 25 MCG (1000 UT) tablet Take 1,000 Units by mouth daily.     No facility-administered encounter medications on file as of 03/25/2019.      Goals Addressed            This Visit's Progress     Patient Stated   . "I want to work on my diabetes" (pt-stated)       Current Barriers:  . Diabetes: controlled at goal <7.5%; most recent A1c 7.4% o Reports concerns  about increased glucose levels recently  . Current antihyperglycemic regimen: Victoza 1.8 mg daily, Basaglar 20 units  . Current glucose readings: o Over the last week, fasting readings have been 180-230s; Reports a 7 day average of 195 . Current meal patterns:  o Notes that she bought a fruit cake lately and has been eating off of that. Sugars have been much higher than normal over the past week o Breakfast: PB sandwich; sometimes no-sugar strawberry jam; sometimes cereal (cornflakes) w/ milk; sometimes eggs o Supper: Frozen dinners w/ fish; salads; spaghetti one night;  o Drinks: bottled  water, lemon juice; no more than 1 pepsi daily  . Cardiovascular risk reduction: o Current hypertensive regimen: amlodipine 2.5 mg daily, losartan 50 mg daily; has not received BP meter from [place] yet o Current hyperlipidemia regimen: atorvastatin 80 mg daily (just increased by Dr. Caryl Bis)  Pharmacist Clinical Goal(s):  Marland Kitchen Over the next 90 days, patient with work with PharmD and primary care provider to address optimized management of diabetes  Interventions: . Comprehensive medication review performed, medication list updated in Epic . Discussed recent elevations d/t dietary excursions. Discussed choosing lower carb options for breakfast, and discussed purchasing small cans of Pepsi instead of the regular can size. Patient notes that she stopped getting the smaller cans because they were more expensive and that she planned to get the normal can sizes and pour some out, however, she hasn't had the will power to do this. Discussed benefit of choosing pre-portioned sizes . Patient requests time to "do better" rather than increase insulin dose at this time. Will defer medication changes for ~4 weeks . Encouraged to check BP a few days per week once she receives her new meter. She verbalized understanding.  . Discussed upcoming fasting labs for lipid panel s/p increasing to atorvastatin 80 mg daily. Patient verbalized understanding.   Patient Self Care Activities:  . Patient will check blood glucose daily, document, and provide at future appointments . Patient will check BP a few days a week, document, and provide at future appointments . Patient will take medications as prescribed . Patient will report any questions or concerns to provider   Please see past updates related to this goal by clicking on the "Past Updates" button in the selected goal         Plan:  - Scheduled outreach in ~ 4 weeks for continued medication management support  [name], PharmD, [place] Pharmacist  [facility] [phone]

## 2019-03-25 NOTE — Progress Notes (Signed)
Reviewed.  Agree with plan   Dr Clarann Helvey 

## 2019-03-25 NOTE — Patient Instructions (Signed)
Visit Information  Goals Addressed            This Visit's Progress     Patient Stated   . "I want to work on my diabetes" (pt-stated)       Current Barriers:  . Diabetes: controlled at goal <7.5%; most recent A1c 7.4% o Reports concerns about increased glucose levels recently  . Current antihyperglycemic regimen: Victoza 1.8 mg daily, Basaglar 20 units  . Current glucose readings: o Over the last week, fasting readings have been 180-230s; Reports a 7 day average of 195 . Current meal patterns:  o Notes that she bought a fruit cake lately and has been eating off of that. Sugars have been much higher than normal over the past week o Breakfast: PB sandwich; sometimes no-sugar strawberry jam; sometimes cereal (cornflakes) w/ milk; sometimes eggs o Supper: Frozen dinners w/ fish; salads; spaghetti one night;  o Drinks: bottled water, lemon juice; no more than 1 pepsi daily  . Cardiovascular risk reduction: o Current hypertensive regimen: amlodipine 2.5 mg daily, losartan 50 mg daily; has not received BP meter from [place] yet o Current hyperlipidemia regimen: atorvastatin 80 mg daily (just increased by Dr. Caryl Bis)  Pharmacist Clinical Goal(s):  Marland Kitchen Over the next 90 days, patient with work with PharmD and primary care provider to address optimized management of diabetes  Interventions: . Comprehensive medication review performed, medication list updated in Epic . Discussed recent elevations d/t dietary excursions. Discussed choosing lower carb options for breakfast, and discussed purchasing small cans of Pepsi instead of the regular can size. Patient notes that she stopped getting the smaller cans because they were more expensive and that she planned to get the normal can sizes and pour some out, however, she hasn't had the will power to do this. Discussed benefit of choosing pre-portioned sizes . Patient requests time to "do better" rather than increase insulin dose at this time. Will  defer medication changes for ~4 weeks . Encouraged to check BP a few days per week once she receives her new meter. She verbalized understanding.  . Discussed upcoming fasting labs for lipid panel s/p increasing to atorvastatin 80 mg daily. Patient verbalized understanding.   Patient Self Care Activities:  . Patient will check blood glucose daily, document, and provide at future appointments . Patient will check BP a few days a week, document, and provide at future appointments . Patient will take medications as prescribed . Patient will report any questions or concerns to provider   Please see past updates related to this goal by clicking on the "Past Updates" button in the selected goal         The patient verbalized understanding of instructions provided today and declined a print copy of patient instruction materials.   Plan:  - Scheduled outreach in ~ 4 weeks for continued medication management support  [name], PharmD, [place] Pharmacist [place] [phone]

## 2019-04-11 ENCOUNTER — Other Ambulatory Visit: Payer: Self-pay

## 2019-04-11 ENCOUNTER — Emergency Department
Admission: EM | Admit: 2019-04-11 | Discharge: 2019-04-11 | Disposition: A | Discharge status: Home / Self Care | Payer: Medicare Other | Attending: Emergency Medicine | Admitting: Emergency Medicine

## 2019-04-11 ENCOUNTER — Emergency Department: Payer: Medicare Other

## 2019-04-11 ENCOUNTER — Encounter: Payer: Self-pay | Admitting: Emergency Medicine

## 2019-04-11 DIAGNOSIS — Z7982 Long term (current) use of aspirin: Secondary | ICD-10-CM | POA: Insufficient documentation

## 2019-04-11 DIAGNOSIS — S301XXA Contusion of abdominal wall, initial encounter: Secondary | ICD-10-CM | POA: Diagnosis not present

## 2019-04-11 DIAGNOSIS — Y999 Unspecified external cause status: Secondary | ICD-10-CM | POA: Insufficient documentation

## 2019-04-11 DIAGNOSIS — M810 Age-related osteoporosis without current pathological fracture: Secondary | ICD-10-CM | POA: Insufficient documentation

## 2019-04-11 DIAGNOSIS — K0889 Other specified disorders of teeth and supporting structures: Secondary | ICD-10-CM | POA: Insufficient documentation

## 2019-04-11 DIAGNOSIS — S01511A Laceration without foreign body of lip, initial encounter: Secondary | ICD-10-CM

## 2019-04-11 DIAGNOSIS — E1122 Type 2 diabetes mellitus with diabetic chronic kidney disease: Secondary | ICD-10-CM | POA: Insufficient documentation

## 2019-04-11 DIAGNOSIS — S50811A Abrasion of right forearm, initial encounter: Secondary | ICD-10-CM | POA: Insufficient documentation

## 2019-04-11 DIAGNOSIS — S0993XA Unspecified injury of face, initial encounter: Secondary | ICD-10-CM

## 2019-04-11 DIAGNOSIS — S30811A Abrasion of abdominal wall, initial encounter: Secondary | ICD-10-CM | POA: Diagnosis not present

## 2019-04-11 DIAGNOSIS — N184 Chronic kidney disease, stage 4 (severe): Secondary | ICD-10-CM | POA: Diagnosis not present

## 2019-04-11 DIAGNOSIS — Y9389 Activity, other specified: Secondary | ICD-10-CM | POA: Diagnosis not present

## 2019-04-11 DIAGNOSIS — S51811A Laceration without foreign body of right forearm, initial encounter: Secondary | ICD-10-CM

## 2019-04-11 DIAGNOSIS — Z79899 Other long term (current) drug therapy: Secondary | ICD-10-CM | POA: Insufficient documentation

## 2019-04-11 DIAGNOSIS — W108XXA Fall (on) (from) other stairs and steps, initial encounter: Secondary | ICD-10-CM | POA: Diagnosis not present

## 2019-04-11 DIAGNOSIS — Z794 Long term (current) use of insulin: Secondary | ICD-10-CM | POA: Insufficient documentation

## 2019-04-11 DIAGNOSIS — I129 Hypertensive chronic kidney disease with stage 1 through stage 4 chronic kidney disease, or unspecified chronic kidney disease: Secondary | ICD-10-CM | POA: Diagnosis not present

## 2019-04-11 DIAGNOSIS — Y929 Unspecified place or not applicable: Secondary | ICD-10-CM | POA: Insufficient documentation

## 2019-04-11 DIAGNOSIS — W19XXXA Unspecified fall, initial encounter: Secondary | ICD-10-CM

## 2019-04-11 LAB — COMPREHENSIVE METABOLIC PANEL
ALT: 21 U/L (ref 0–44)
AST: 23 U/L (ref 15–41)
Albumin: 3.9 g/dL (ref 3.5–5.0)
Alkaline Phosphatase: 73 U/L (ref 38–126)
Anion gap: 9 (ref 5–15)
BUN: 20 mg/dL (ref 8–23)
CO2: 27 mmol/L (ref 22–32)
Calcium: 9.6 mg/dL (ref 8.9–10.3)
Chloride: 104 mmol/L (ref 98–111)
Creatinine, Ser: 1.97 mg/dL — ABNORMAL HIGH (ref 0.44–1.00)
GFR calc Af Amer: 28 mL/min — ABNORMAL LOW (ref >60)
GFR calc non Af Amer: 24 mL/min — ABNORMAL LOW (ref >60)
Glucose, Bld: 116 mg/dL — ABNORMAL HIGH (ref 70–99)
Potassium: 3.6 mmol/L (ref 3.5–5.1)
Sodium: 140 mmol/L (ref 135–145)
Total Bilirubin: 0.9 mg/dL (ref 0.3–1.2)
Total Protein: 7.1 g/dL (ref 6.5–8.1)

## 2019-04-11 LAB — CBC WITH DIFFERENTIAL/PLATELET
Abs Immature Granulocytes: 0.02 10*3/uL (ref 0.00–0.07)
Basophils Absolute: 0.1 10*3/uL (ref 0.0–0.1)
Basophils Relative: 1 %
Eosinophils Absolute: 0.1 10*3/uL (ref 0.0–0.5)
Eosinophils Relative: 2 %
HCT: 45.3 % (ref 36.0–46.0)
Hemoglobin: 14.5 g/dL (ref 12.0–15.0)
Immature Granulocytes: 0 %
Lymphocytes Relative: 28 %
Lymphs Abs: 1.8 10*3/uL (ref 0.7–4.0)
MCH: 30.3 pg (ref 26.0–34.0)
MCHC: 32 g/dL (ref 30.0–36.0)
MCV: 94.6 fL (ref 80.0–100.0)
Monocytes Absolute: 0.5 10*3/uL (ref 0.1–1.0)
Monocytes Relative: 7 %
Neutro Abs: 3.9 10*3/uL (ref 1.7–7.7)
Neutrophils Relative %: 62 %
Platelets: 191 10*3/uL (ref 150–400)
RBC: 4.79 MIL/uL (ref 3.87–5.11)
RDW: 13.6 % (ref 11.5–15.5)
WBC: 6.4 10*3/uL (ref 4.0–10.5)
nRBC: 0 % (ref 0.0–0.2)

## 2019-04-11 LAB — URINALYSIS, COMPLETE (UACMP) WITH MICROSCOPIC
Bacteria, UA: NONE SEEN
Bilirubin Urine: NEGATIVE
Glucose, UA: NEGATIVE mg/dL
Hgb urine dipstick: NEGATIVE
Ketones, ur: NEGATIVE mg/dL
Leukocytes,Ua: NEGATIVE
Nitrite: NEGATIVE
Protein, ur: 30 mg/dL — AB
Specific Gravity, Urine: 1.025 (ref 1.005–1.030)
pH: 5 (ref 5.0–8.0)

## 2019-04-11 MED ORDER — HYDROCODONE-ACETAMINOPHEN 5-325 MG PO TABS
1.0000 | ORAL_TABLET | Freq: Once | ORAL | Status: AC
  Administered 2019-04-11: 1 via ORAL
  Filled 2019-04-11: qty 1

## 2019-04-11 NOTE — Discharge Instructions (Addendum)
Follow-up with your dentist regarding her loose tooth.  You may peel off the glue on your upper lip after 5 days if it is still adhered.  Return to the ER for worsening symptoms, persistent vomiting, difficulty breathing or other concerns.

## 2019-04-11 NOTE — ED Triage Notes (Addendum)
Patient ambulatory to triage with steady gait, without difficulty or distress noted; pt reports that she tripped  & fell down 3 steps landing on concrete; skin tear noted to rt FA; abrasion to rt side abd; lac to inside and outer upper lip with upper tooth injury; denies LOC; no cervical or spinal tenderness with palpation

## 2019-04-11 NOTE — ED Provider Notes (Signed)
[facility] Emergency Department Provider Note   ____________________________________________   First MD Initiated Contact with Patient 04/11/19 [phone]     (approximate)  I have reviewed the triage vital signs and the nursing notes.   HISTORY  Chief Complaint Fall    HPI Kelly [name] is a 78 y.o. female who presents to the ED status post mechanical fall.  Patient was bringing a potted plant in from the cold approximately 11:30 PM when she tripped and fell down 3 steps landing on the concrete.  Did not strike head or suffer LOC.  Presents with lip laceration, loose upper left front tooth, skin tear to her right forearm and abrasion to her right abdominal wall.  Tetanus is up-to-date.  Patient denies anticoagulant use.  Denies vision changes, neck pain, chest pain, shortness of breath, nausea, vomiting, dizziness.      Past Medical History:  Diagnosis Date   Colon polyps    Depression    DM (diabetes mellitus), type 2 with renal complications (HCC)    Hyperlipidemia    Hypertension    Kidney stones    Sleep apnea    Squamous cell skin cancer 12/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     Patient Active Problem List   Diagnosis Date Noted   Occipital neuralgia of left side 07/23/2018   Osteoporosis 07/23/2018   Muscle cramps 02/12/2018   Numbness and tingling of left thumb 10/25/2017   Rectal lesion 09/07/2017   Height loss 08/15/2017   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   Cervical spondylosis without myelopathy 08/15/2017   DDD (degenerative disc disease), cervical 08/15/2017   Bursitis of hip 08/15/2017   Female stress incontinence 08/15/2017   Muscle weakness 08/15/2017   Neck pain 08/15/2017   Spondylolisthesis, congenital 08/15/2017   Radial styloid tenosynovitis 08/15/2017   Senile osteoporosis 08/15/2017   Shoulder joint pain 08/15/2017   Anxiety and depression 07/25/2017   Rectal bleeding 07/25/2017    Essential tremor 04/26/2017   Other fatigue 03/22/2017   Left ventricular hypertrophy 10/12/2016   CKD (chronic kidney disease) stage 4, GFR 15-29 ml/min ([place]) 08/26/2016   OSA on CPAP 08/26/2016   DM (diabetes mellitus), type 2 with renal complications ([place])    Hyperlipidemia    Hypertension     Past Surgical History:  Procedure Laterality Date   APPENDECTOMY     BREAST BIOPSY Right 06/19/2018   affirm stereo/x clip/path pending   cataract  03/2017   CHOLECYSTECTOMY     COLONOSCOPY     COLONOSCOPY WITH PROPOFOL N/A 08/22/2017   Procedure: COLONOSCOPY WITH PROPOFOL;  Surgeon: Jonathon Bellows, MD;  Location: [facility] ENDOSCOPY;  Service: Gastroenterology;  Laterality: N/A;   ESOPHAGOGASTRODUODENOSCOPY     ESOPHAGOGASTRODUODENOSCOPY (EGD) WITH PROPOFOL N/A 03/23/2016   Procedure: ESOPHAGOGASTRODUODENOSCOPY (EGD) WITH PROPOFOL;  Surgeon: Manya Silvas, MD;  Location: [facility] ENDOSCOPY;  Service: Endoscopy;  Laterality: N/A;   FLEXIBLE SIGMOIDOSCOPY N/A 02/06/2018   Procedure: FLEXIBLE SIGMOIDOSCOPY;  Surgeon: Jonathon Bellows, MD;  Location: [facility] ENDOSCOPY;  Service: Gastroenterology;  Laterality: N/A;   FLEXIBLE SIGMOIDOSCOPY N/A 12/03/2018   Procedure: FLEXIBLE SIGMOIDOSCOPY;  Surgeon: Jonathon Bellows, MD;  Location: [facility] ENDOSCOPY;  Service: Gastroenterology;  Laterality: N/A;   RECTAL PROLAPSE REPAIR  2012   x2    Prior to Admission medications   Medication Sig Start Date End Date Taking? Authorizing Provider  ALPRAZolam Duanne Moron) 0.5 MG tablet Take 0.5 tablets (0.25 mg total) by mouth 2 (two) times daily as needed for anxiety. 10/25/17  [name], MD  amLODipine (NORVASC) 2.5 MG tablet TAKE 1 TABLET DAILY 05/25/18   [name], MD  aspirin EC 81 MG tablet Take 81 mg by mouth daily.    [provider]  atorvastatin (LIPITOR) 80 MG tablet Take 1 tablet (80 mg total) by mouth daily. 02/25/19   [name], MD  calcitRIOL (ROCALTROL) 0.25 MCG capsule  Take 0.25 mcg by mouth daily.    [provider]  cyclobenzaprine (FLEXERIL) 10 MG tablet TAKE 1/2 TABLET (5MG  TOTAL)3 TIMES A DAY AS NEEDED    FOR MUSCLE SPASMS 09/24/18   [name], MD  denosumab (PROLIA) 60 MG/ML SOSY injection Inject 60 mg into the skin every 6 (six) months.    [provider]  furosemide (LASIX) 40 MG tablet Take 1 tablet (40 mg total) by mouth daily as needed. 01/11/17   Coral Spikes, DO  gabapentin (NEURONTIN) 100 MG capsule Take 100 mg by mouth 2 (two) times daily.    [provider]  glucose blood (ACCU-CHEK AVIVA PLUS) test strip Use to check blood sugar up to 4 times daily 07/30/18   [name], MD  HYDROcodone-acetaminophen (NORCO) 5-325 MG tablet Norco 5 mg-325 mg tablet  Take 1 tablet twice a day by oral route as needed.    [provider]  Insulin Glargine (BASAGLAR KWIKPEN) 100 UNIT/ML SOPN INJECT 20 UNITS TOTAL      SUBCUTANEOUSLY DAILY 02/27/19   [name], MD  Insulin Pen Needle (BD PEN NEEDLE NANO U/F) 32G X 4 MM MISC USE DAILY WITH VICTOZA AND BASAGLAR 09/24/18   [name], MD  Lancets MISC Use up to 4 times daily to check blood sugars. 11/04/16   Coral Spikes, DO  losartan (COZAAR) 50 MG tablet TAKE 1 TABLET DAILY 02/27/19   [name], MD  PARoxetine (PAXIL-CR) 25 MG 24 hr tablet TAKE 1 TABLET DAILY 05/25/18   [name], MD  potassium chloride (K-DUR) 10 MEQ tablet potassium chloride ER 10 mEq tablet,extended release    [provider]  VICTOZA 18 MG/3ML SOPN INJECT 0.3ML (1.8MG  TOTAL) SUBCUTANEOUSLY DAILY 01/11/19   [name], MD    Allergies Prilosec otc [omeprazole magnesium], Sucralfate, Amoxicillin, and Morphine and related  Family History  Problem Relation Age of Onset   Stroke Mother    Arthritis Mother    Aortic aneurysm Sister    Lung cancer Sister    Heart attack Father 24   Breast cancer Sister 54    Social History Social History    Tobacco Use   Smoking status: Never Smoker   Smokeless tobacco: Never Used  Substance Use Topics   Alcohol use: No   Drug use: No    Review of Systems  Constitutional: No fever/chills Eyes: No visual changes. ENT: Positive for tooth injury and lip laceration.  No sore throat. Cardiovascular: Denies chest pain. Respiratory: Denies shortness of breath. Gastrointestinal: Positive for abrasion to right upper quadrant abdominal wall.  No abdominal pain.  No nausea, no vomiting.  No diarrhea.  No constipation. Genitourinary: Negative for dysuria. Musculoskeletal: Negative for back pain. Skin: Negative for rash. Neurological: Negative for headaches, focal weakness or numbness.   ____________________________________________   PHYSICAL EXAM:  VITAL SIGNS: ED Triage Vitals  Enc Vitals Group     BP 04/11/19 0104 (!) 184/79     Pulse Rate 04/11/19 0104 78     Resp 04/11/19 0104 18  Temp 04/11/19 0104 98.1 F (36.7 C)     Temp Source 04/11/19 0104 Oral     SpO2 04/11/19 0104 97 %     Weight 04/11/19 0105 145 lb (65.8 kg)     Height 04/11/19 0105 5\' 4"  (1.626 m)     Head Circumference --      Peak Flow --      Pain Score 04/11/19 0104 3     Pain Loc --      Pain Edu? --      Excl. in [place]? --     Constitutional: Alert and oriented. Well appearing and in no acute distress. Eyes: Conjunctivae are normal. PERRL. EOMI. Head: Atraumatic. Nose: Atraumatic. Mouth/Throat: Mucous membranes are moist.  No dental malocclusion.  Loose left upper central incisor.  Less than 1 cm well approximated laceration to upper left outer lip and same to inner upper left lip.  No active bleeding. Neck: No stridor.  No cervical spine tenderness to palpation. Cardiovascular: Normal rate, regular rhythm. Grossly normal heart sounds.  Good peripheral circulation. Respiratory: Normal respiratory effort.  No retractions. Lungs CTAB. Gastrointestinal: Soft and nontender to light or deep palpation.   Ecchymosis over right upper quadrant.  No distention. No abdominal bruits. No CVA tenderness. Musculoskeletal: Skin tear to right forearm.  No lower extremity tenderness nor edema.  No joint effusions. Neurologic:  Normal speech and language. No gross focal neurologic deficits are appreciated. No gait instability. Skin:  Skin is warm, dry and intact. No rash noted. Psychiatric: Mood and affect are normal. Speech and behavior are normal.  ____________________________________________   LABS (all labs ordered are listed, but only abnormal results are displayed)  Labs Reviewed  COMPREHENSIVE METABOLIC PANEL - Abnormal; Notable for the following components:      Result Value   Glucose, Bld 116 (*)    Creatinine, Ser 1.97 (*)    GFR calc non Af Amer 24 (*)    GFR calc Af Amer 28 (*)    All other components within normal limits  URINALYSIS, COMPLETE (UACMP) WITH MICROSCOPIC - Abnormal; Notable for the following components:   Color, Urine YELLOW (*)    APPearance HAZY (*)    Protein, ur 30 (*)    All other components within normal limits  CBC WITH DIFFERENTIAL/PLATELET   ____________________________________________  EKG  None ____________________________________________  RADIOLOGY  ED MD interpretation: Soft tissue contusion without intra-abdominal injury  Official radiology report(s): Ct Abdomen Pelvis Wo Contrast  Result Date: 04/11/2019 CLINICAL DATA:  Fall down 3 steps. Abrasion to right side of abdomen. EXAM: CT ABDOMEN AND PELVIS WITHOUT CONTRAST TECHNIQUE: Multidetector CT imaging of the abdomen and pelvis was performed following the standard protocol without IV contrast. COMPARISON:  CT of the abdomen and pelvis 07/27/2018 FINDINGS: Lower chest: Patchy airspace opacities are present at the lung bases, left greater than right. There is no effusion. The heart size is normal. No significant pericardial effusion is present. Hepatobiliary: No focal liver abnormality is seen. Status  post cholecystectomy. No biliary dilatation. Pancreas: Unremarkable. No pancreatic ductal dilatation or surrounding inflammatory changes. Spleen: Normal in size without focal abnormality. Adrenals/Urinary Tract: Adrenal glands are normal bilaterally. Kidneys are atrophic bilaterally. No stone or mass lesion is present. There is no obstruction. The ureters are within normal limits. The urinary bladder is normal. Stomach/Bowel: A small hiatal hernia is present. The stomach and duodenum are otherwise normal. Small bowel is unremarkable. Terminal ileum is within normal limits. The appendix is not discretely visualized  and may be surgically absent. The ascending and transverse colon are within normal limits. Diverticular changes are present the sigmoid colon. No focal inflammatory changes are present. Vascular/Lymphatic: Atherosclerotic changes are present within the aorta and branch vessels. There is no aneurysm. No significant retroperitoneal adenopathy is present. Reproductive: Uterus and bilateral adnexa are unremarkable. Other: No abdominal wall hernia or abnormality. No abdominopelvic ascites. Periumbilical subcutaneous hyperdensity likely represents fat necrosis, stable from the prior exam. There is slight stranding in the subcutaneous fat and right upper quadrant, contain consistent with the acute contusion. No additional underlying trauma is present. 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 Chronic endplate degenerative changes at L2-3 is stable. No acute fractures are present. No focal lytic or blastic lesions are evident. The hips are located and within normal limits bilaterally. IMPRESSION: 1. Soft tissue contusion in the right upper quadrant without underlying fracture or other acute trauma. 2. Chronic changes are otherwise stable as described. 3. Aortic Atherosclerosis (ICD10-I70.0). Electronically Signed   By: San Morelle M.D.   On: 04/11/2019 05:02     ____________________________________________   PROCEDURES  Procedure(s) performed (including Critical Care):  Marland KitchenMarland KitchenLaceration Repair  Date/Time: 04/11/2019 6:48 AM Performed by: Paulette Blanch, MD Authorized by: Paulette Blanch, MD   Consent:    Consent obtained:  Verbal   Consent given by:  Patient   Risks discussed:  Infection, pain, poor cosmetic result and poor wound healing Anesthesia (see MAR for exact dosages):    Anesthesia method:  None Laceration details:    Location:  Lip   Length (cm):  0.5   Depth (mm):  1 Repair type:    Repair type:  Simple Exploration:    Hemostasis achieved with:  Direct pressure   Wound exploration: entire depth of wound probed and visualized     Contaminated: no   Treatment:    Area cleansed with:  Saline   Amount of cleaning:  Standard   Irrigation solution:  Sterile saline   Irrigation method:  Syringe   Visualized foreign bodies/material removed: no   Skin repair:    Repair method:  Tissue adhesive Approximation:    Approximation:  Close Post-procedure details:    Patient tolerance of procedure:  Tolerated well, no immediate complications     ____________________________________________   INITIAL IMPRESSION / ASSESSMENT AND PLAN / ED COURSE  As part of my medical decision making, I reviewed the following data within the [place] notes reviewed and incorporated, Labs reviewed, Radiograph reviewed and Notes from prior ED visits     Kelly [name] was evaluated in Emergency Department on 04/11/2019 for the symptoms described in the history of present illness. She was evaluated in the context of the global COVID-19 pandemic, which necessitated consideration that the patient might be at risk for infection with the SARS-CoV-2 virus that causes COVID-19. Institutional protocols and algorithms that pertain to the evaluation of patients at risk for COVID-19 are in a state of rapid change based on  information released by regulatory bodies including the CDC and federal and state organizations. These policies and algorithms were followed during the patient's care in the ED.    78 year old female who presents to the ED status post mechanical fall with dental injury, lip laceration, right forearm skin tear and ecchymosis over her right upper abdomen.  Differential diagnosis includes but is not limited to musculoskeletal contusion, liver laceration, fracture/dislocation, etc.  Given patient's CKD stage IV, will obtain CT  abdomen/pelvis without contrast.  H/H is unremarkable and patient remains hemodynamically stable.  Discussed repair of her through and through lip injury which does not cross the vermilion border; will Dermabond outer lip and leave inner lip to heal naturally.   Clinical Course as of Apr 10 548  Thu Apr 11, 2019  0547 Updated patient and her daughter of CT imaging results.  Strict return precautions given.  Both verbalized understanding and agree with plan of care.   [JS]    Clinical Course User Index [JS] Paulette Blanch, MD     ____________________________________________   FINAL CLINICAL IMPRESSION(S) / ED DIAGNOSES  Final diagnoses:  Fall, initial encounter  Skin tear of right forearm without complication, initial encounter  Lip laceration, initial encounter  Dental injury, initial encounter  Contusion of abdominal wall, initial encounter     ED Discharge Orders    None       Note:  This document was prepared using Dragon voice recognition software and may include unintentional dictation errors.   Paulette Blanch, MD 04/11/19 [phone]

## 2019-04-11 NOTE — ED Notes (Signed)
Antibiotic ointment applied to left arm, wrapped with gauze, and non adherent dressing. Pt tolerated well and educated on care. Pt verbalized understanding.

## 2019-04-12 ENCOUNTER — Ambulatory Visit: Payer: Self-pay | Admitting: *Deleted

## 2019-04-12 ENCOUNTER — Telehealth: Payer: Self-pay | Admitting: Family Medicine

## 2019-04-12 NOTE — Telephone Encounter (Signed)
Patient is checking status if she can come into the office, If patient does not answer please leave message.    Patient also wanted PCP to know that patient has a bruise on right thigh patient is worried about blood clot.   Call back [phone]

## 2019-04-12 NOTE — Telephone Encounter (Signed)
Tried to reach patient by phone no appointment available in office concern fo rblood clot needs evaluation at [place]. [facility] nurse triage and advise,.

## 2019-04-12 NOTE — Telephone Encounter (Signed)
Left message for patient to call offie.

## 2019-04-12 NOTE — Telephone Encounter (Signed)
Pt stated that she fell on her right side and her tooth almost went thru her lip.  she was seen in the ED and used glue to close the laceration there. She also has a bruised on her right thigh, outer portion. She has a scratch on her arm and bruised on her chin.  She is not blood thinners. She is requesting an  appointment with her pcp on [place] office visit. No appropriate protocol. Notified LB at [facility] regarding an appointment. Call conference in with the practice.   Answer Assessment - Initial Assessment Questions 1. MECHANISM: "How did the injury happen?"      Tripped and fell down some stairs 2. ONSET: "When did the injury happen?" (Minutes or hours ago)      Last night 3. LOCATION: "What part of the mouth is injured?"      Top lip 4. APPEARANCE of INJURY: "What does the mouth look like?"      swollen 5. BLEEDING: "Is the mouth still bleeding?" If so, ask: "Is it difficult to stop?"      no 6. SIZE: For cuts, bruises, or swelling, ask: "How large is it?" (e.g., inches or centimeters; entire lip)      Lip is swollen  7. PAIN: "Is it painful?" If so, ask: "How bad is the pain?"   (e.g., Scale 1-10; or mild, moderate, severe)     Very tender to touch 8. TETANUS: For any breaks in the skin, ask: "When was the last tetanus booster?"     Tetanus up to date. 9. OTHER SYMPTOMS: "Are you having any trouble breathing?"     no 10. PREGNANCY: "Is there any chance you are pregnant?" "When was your last menstrual period?"       n/a  Protocols used: MOUTH INJURY-A-AH

## 2019-04-12 NOTE — Telephone Encounter (Signed)
Pt fell on 12/03 and went to the ED front tooth went almost through top lip which swollen. Pt went to the dentist front tooth is loose. Pt needs to follow up with provider. Pt cannot do virtual. Please advise if pt needs to be seen? Thank you!  Call pt @ [phone] pt has to leave for appt at 12 if not avail please leave vm

## 2019-04-15 ENCOUNTER — Ambulatory Visit (INDEPENDENT_AMBULATORY_CARE_PROVIDER_SITE_OTHER): Payer: Medicare Other | Admitting: Family Medicine

## 2019-04-15 ENCOUNTER — Encounter: Payer: Self-pay | Admitting: Family Medicine

## 2019-04-15 ENCOUNTER — Other Ambulatory Visit: Payer: Self-pay

## 2019-04-15 DIAGNOSIS — Z794 Long term (current) use of insulin: Secondary | ICD-10-CM

## 2019-04-15 DIAGNOSIS — E785 Hyperlipidemia, unspecified: Secondary | ICD-10-CM

## 2019-04-15 DIAGNOSIS — W19XXXD Unspecified fall, subsequent encounter: Secondary | ICD-10-CM

## 2019-04-15 DIAGNOSIS — S51811D Laceration without foreign body of right forearm, subsequent encounter: Secondary | ICD-10-CM

## 2019-04-15 DIAGNOSIS — N184 Chronic kidney disease, stage 4 (severe): Secondary | ICD-10-CM

## 2019-04-15 DIAGNOSIS — E1122 Type 2 diabetes mellitus with diabetic chronic kidney disease: Secondary | ICD-10-CM | POA: Diagnosis not present

## 2019-04-15 DIAGNOSIS — W19XXXA Unspecified fall, initial encounter: Secondary | ICD-10-CM

## 2019-04-15 DIAGNOSIS — S01511D Laceration without foreign body of lip, subsequent encounter: Secondary | ICD-10-CM | POA: Diagnosis not present

## 2019-04-15 NOTE — Assessment & Plan Note (Signed)
CBGs have trended up.  She will continue her current regimen and have labs next week.  I discussed dietary changes and decreasing soda intake.  She will follow up with our clinical pharmacist next week as well.

## 2019-04-15 NOTE — Telephone Encounter (Signed)
Patient has appointment today with PCP  °

## 2019-04-15 NOTE — Assessment & Plan Note (Addendum)
Plan for repeat lipid panel as planned next week.  Continue Lipitor.  Monitor for myalgias.

## 2019-04-15 NOTE — Progress Notes (Signed)
Virtual Visit via telephone Note  This visit type was conducted due to national recommendations for restrictions regarding the COVID-19 pandemic (e.g. social distancing).  This format is felt to be most appropriate for this patient at this time.  All issues noted in this document were discussed and addressed.  No physical exam was performed (except for noted visual exam findings with Video Visits).   I connected with Kelly [name] today at 10:00 AM EST by telephone and verified that I am speaking with the correct person using two identifiers.  In Location patient: home Location provider: home office Persons participating in the virtual visit: patient, provider  I discussed the limitations, risks, security and privacy concerns of performing an evaluation and management service by telephone and the availability of in person appointments. I also discussed with the patient that there may be a patient responsible charge related to this service. The patient expressed understanding and agreed to proceed.  Interactive audio and video telecommunications were attempted between this provider and patient, however failed, due to patient having technical difficulties OR patient did not have access to video capability.  We continued and completed visit with audio only.   Reason for visit: follow-up  HPI: Fall: Patient was evaluated in the emergency department on 04/11/2019 for a fall.  She was walking into her garage with a plant and fell down 3 steps.  She fell into a garbage bin first and then fell to the floor which was concrete.  She notes her tooth hit the concrete and lacerated her upper lip with a through and through injury.  This did not involve the vermilion border based on review of the ED note.  She notes no loss of consciousness.  She had the lip laceration repaired with glue.  She has had no drainage from this or redness.  She does have 2 loose teeth and she did see her dentist who advised  that the teeth would likely tighten up on their own.  She notes her pain is tolerable.  She has a skin tear on her right forearm from the wrist up to her elbow and notes this is scabbing and healing okay.  She is not on a blood thinner.  She did notice a hematoma on her right thigh with a deep lump with overlying bruising.  She is up-to-date on her tetanus vaccine.  CT abdomen pelvis without contrast revealed findings of a contusion though no other acute changes.  Diabetes: Notes her sugars have been up to 200s recently.  She is checking in the middle the day though it is a fasting sugar.  She is on Architect.  She is eating lots of fish.  She does eat lots of peanut butter and salads.  Lots of vegetables.  No fast foods.  She does have 1 soda daily.  Hyperlipidemia: Taking Lipitor 80 mg once daily.  No myalgias.  She is scheduled for lab work next week.   ROS: See pertinent positives and negatives per HPI.  Past Medical History:  Diagnosis Date  . Colon polyps   . Depression   . DM (diabetes mellitus), type 2 with renal complications ([place])   . Hyperlipidemia   . Hypertension   . Kidney stones   . Sleep apnea   . 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     Past Surgical History:  Procedure Laterality Date  . APPENDECTOMY    . BREAST BIOPSY Right 06/19/2018   affirm  stereo/x clip/path pending  . cataract  03/2017  . CHOLECYSTECTOMY    . COLONOSCOPY    . COLONOSCOPY WITH PROPOFOL N/A 08/22/2017   Procedure: COLONOSCOPY WITH PROPOFOL;  Surgeon: Jonathon Bellows, MD;  Location: [facility] ENDOSCOPY;  Service: Gastroenterology;  Laterality: N/A;  . ESOPHAGOGASTRODUODENOSCOPY    . ESOPHAGOGASTRODUODENOSCOPY (EGD) WITH PROPOFOL N/A 03/23/2016   Procedure: ESOPHAGOGASTRODUODENOSCOPY (EGD) WITH PROPOFOL;  Surgeon: Manya Silvas, MD;  Location: [facility] ENDOSCOPY;  Service: Endoscopy;  Laterality: N/A;  . FLEXIBLE SIGMOIDOSCOPY N/A 02/06/2018   Procedure: FLEXIBLE  SIGMOIDOSCOPY;  Surgeon: Jonathon Bellows, MD;  Location: [facility] ENDOSCOPY;  Service: Gastroenterology;  Laterality: N/A;  . FLEXIBLE SIGMOIDOSCOPY N/A 12/03/2018   Procedure: FLEXIBLE SIGMOIDOSCOPY;  Surgeon: Jonathon Bellows, MD;  Location: [facility] ENDOSCOPY;  Service: Gastroenterology;  Laterality: N/A;  . RECTAL PROLAPSE REPAIR  2012   x2    Family History  Problem Relation Age of Onset  . Stroke Mother   . Arthritis Mother   . Aortic aneurysm Sister   . Lung cancer Sister   . Heart attack Father 77  . Breast cancer Sister 50    SOCIAL HX: Non-smoker.   Current Outpatient Medications:  .  ALPRAZolam (XANAX) 0.5 MG tablet, Take 0.5 tablets (0.25 mg total) by mouth 2 (two) times daily as needed for anxiety., Disp: 30 tablet, Rfl: 0 .  amLODipine (NORVASC) 2.5 MG tablet, TAKE 1 TABLET DAILY, Disp: 90 tablet, Rfl: 3 .  aspirin EC 81 MG tablet, Take 81 mg by mouth daily., Disp: , Rfl:  .  atorvastatin (LIPITOR) 80 MG tablet, Take 1 tablet (80 mg total) by mouth daily., Disp: 90 tablet, Rfl: 1 .  calcitRIOL (ROCALTROL) 0.25 MCG capsule, Take 0.25 mcg by mouth daily., Disp: , Rfl:  .  cyclobenzaprine (FLEXERIL) 10 MG tablet, TAKE 1/2 TABLET (5MG  TOTAL)3 TIMES A DAY AS NEEDED    FOR MUSCLE SPASMS, Disp: 45 tablet, Rfl: 0 .  denosumab (PROLIA) 60 MG/ML SOSY injection, Inject 60 mg into the skin every 6 (six) months., Disp: , Rfl:  .  furosemide (LASIX) 40 MG tablet, Take 1 tablet (40 mg total) by mouth daily as needed., Disp: 90 tablet, Rfl: 0 .  gabapentin (NEURONTIN) 100 MG capsule, Take 100 mg by mouth 2 (two) times daily., Disp: , Rfl:  .  glucose blood (ACCU-CHEK AVIVA PLUS) test strip, Use to check blood sugar up to 4 times daily, Disp: 100 each, Rfl: 3 .  HYDROcodone-acetaminophen (NORCO) 5-325 MG tablet, Norco 5 mg-325 mg tablet  Take 1 tablet twice a day by oral route as needed., Disp: , Rfl:  .  Insulin Glargine (BASAGLAR KWIKPEN) 100 UNIT/ML SOPN, INJECT 20 UNITS TOTAL      SUBCUTANEOUSLY  DAILY, Disp: 15 mL, Rfl: 0 .  Insulin Pen Needle (BD PEN NEEDLE NANO U/F) 32G X 4 MM MISC, USE DAILY WITH VICTOZA AND BASAGLAR, Disp: 180 each, Rfl: 2 .  Lancets MISC, Use up to 4 times daily to check blood sugars., Disp: 200 each, Rfl: 11 .  losartan (COZAAR) 50 MG tablet, TAKE 1 TABLET DAILY, Disp: 90 tablet, Rfl: 1 .  PARoxetine (PAXIL-CR) 25 MG 24 hr tablet, TAKE 1 TABLET DAILY, Disp: 90 tablet, Rfl: 3 .  potassium chloride (K-DUR) 10 MEQ tablet, potassium chloride ER 10 mEq tablet,extended release, Disp: , Rfl:  .  VICTOZA 18 MG/3ML SOPN, INJECT 0.3ML (1.8MG  TOTAL) SUBCUTANEOUSLY DAILY, Disp: 27 mL, Rfl: 3  EXAM: This is a telehealth telephone visit and thus no physical exam  was completed.  ASSESSMENT AND PLAN:  Discussed the following assessment and plan:  Fall Status post mechanical fall with lip laceration and skin tear of right forearm.  She reports she is healing well.  No signs of infection.  I advised her to monitor for any purulent drainage or redness at the sites of her wounds and to contact immediately [place] if she develops any signs of infection.  She will let her dentist know if her teeth do not improve.  She will monitor the hematoma in her right thigh.  She was concerned that this could represent a DVT though I discussed that the history is not consistent with a DVT and indicates a hematoma as the cause.  Hyperlipidemia Plan for repeat lipid panel as planned next week.  Continue Lipitor.  Monitor for myalgias.  DM (diabetes mellitus), type 2 with renal complications ([place]) CBGs have trended up.  She will continue her current regimen and have labs next week.  I discussed dietary changes and decreasing soda intake.  She will follow up with our clinical pharmacist next week as well.    I discussed the assessment and treatment plan with the patient. The patient was provided an opportunity to ask questions and all were answered. The patient agreed with the plan and demonstrated an  understanding of the instructions.   The patient was advised to call back or seek an in-person evaluation if the symptoms worsen or if the condition fails to improve as anticipated.  I provided 20 minutes of non-face-to-face time during this encounter.   Tommi Rumps, MD

## 2019-04-15 NOTE — Assessment & Plan Note (Addendum)
Status post mechanical fall with lip laceration and skin tear of right forearm.  She reports she is healing well.  No signs of infection.  I advised her to monitor for any purulent drainage or redness at the sites of her wounds and to contact immediately [place] if she develops any signs of infection.  She will let her dentist know if her teeth do not improve.  She will monitor the hematoma in her right thigh.  She was concerned that this could represent a DVT though I discussed that the history is not consistent with a DVT and indicates a hematoma as the cause.

## 2019-04-22 ENCOUNTER — Other Ambulatory Visit (INDEPENDENT_AMBULATORY_CARE_PROVIDER_SITE_OTHER): Payer: Medicare Other

## 2019-04-22 ENCOUNTER — Other Ambulatory Visit: Payer: Self-pay

## 2019-04-22 DIAGNOSIS — N184 Chronic kidney disease, stage 4 (severe): Secondary | ICD-10-CM

## 2019-04-22 DIAGNOSIS — E785 Hyperlipidemia, unspecified: Secondary | ICD-10-CM | POA: Diagnosis not present

## 2019-04-22 DIAGNOSIS — Z794 Long term (current) use of insulin: Secondary | ICD-10-CM

## 2019-04-22 DIAGNOSIS — E1122 Type 2 diabetes mellitus with diabetic chronic kidney disease: Secondary | ICD-10-CM

## 2019-04-22 LAB — COMPREHENSIVE METABOLIC PANEL
ALT: 34 U/L (ref 0–35)
AST: 31 U/L (ref 0–37)
Albumin: 3.8 g/dL (ref 3.5–5.2)
Alkaline Phosphatase: 79 U/L (ref 39–117)
BUN: 15 mg/dL (ref 6–23)
CO2: 31 mEq/L (ref 19–32)
Calcium: 9.1 mg/dL (ref 8.4–10.5)
Chloride: 107 mEq/L (ref 96–112)
Creatinine, Ser: 1.98 mg/dL — ABNORMAL HIGH (ref 0.40–1.20)
GFR: 24.38 mL/min — ABNORMAL LOW (ref >60.00)
Glucose, Bld: 151 mg/dL — ABNORMAL HIGH (ref 70–99)
Potassium: 4.4 mEq/L (ref 3.5–5.1)
Sodium: 146 mEq/L — ABNORMAL HIGH (ref 135–145)
Total Bilirubin: 0.6 mg/dL (ref 0.2–1.2)
Total Protein: 6 g/dL (ref 6.0–8.3)

## 2019-04-22 LAB — LIPID PANEL
Cholesterol: 107 mg/dL (ref 0–200)
HDL: 27.4 mg/dL — ABNORMAL LOW (ref >39.00)
LDL Cholesterol: 46 mg/dL (ref 0–99)
NonHDL: 79.18
Total CHOL/HDL Ratio: 4
Triglycerides: 168 mg/dL — ABNORMAL HIGH (ref 0.0–149.0)
VLDL: 33.6 mg/dL (ref 0.0–40.0)

## 2019-04-22 LAB — HEMOGLOBIN A1C: Hgb A1c MFr Bld: 7.6 % — ABNORMAL HIGH (ref 4.6–6.5)

## 2019-04-25 ENCOUNTER — Ambulatory Visit (INDEPENDENT_AMBULATORY_CARE_PROVIDER_SITE_OTHER): Payer: Medicare Other | Admitting: Pharmacist

## 2019-04-25 DIAGNOSIS — N184 Chronic kidney disease, stage 4 (severe): Secondary | ICD-10-CM | POA: Diagnosis not present

## 2019-04-25 DIAGNOSIS — I10 Essential (primary) hypertension: Secondary | ICD-10-CM

## 2019-04-25 DIAGNOSIS — E785 Hyperlipidemia, unspecified: Secondary | ICD-10-CM

## 2019-04-25 DIAGNOSIS — E1122 Type 2 diabetes mellitus with diabetic chronic kidney disease: Secondary | ICD-10-CM

## 2019-04-25 DIAGNOSIS — Z794 Long term (current) use of insulin: Secondary | ICD-10-CM

## 2019-04-25 NOTE — Patient Instructions (Signed)
Visit Information  Goals Addressed            This Visit's Progress     Patient Stated   . "I want to work on my diabetes" (pt-stated)       Current Barriers:  . Diabetes: uncontrolled at goal <7.5%; most recent A1c 7.6% o Had a fall, seen by Dr. Caryl Bis 04/15/2019 for evaluation o Reviewed recent lab work. Does report f/u w/ Dr. Candiss Norse scheduled 07/22/2018 . Current antihyperglycemic regimen: Victoza 1.8 mg daily, Basaglar 20 units; denies missing any doses   . Current glucose readings:  Fasting After Lunch Before Supper Bedtime    1-Dec 156      2-Dec 177      3-Dec 147 142 205 104   4-Dec 123      5-Dec 156 280     6-Dec 162   219   7-Dec 105  65 305 Didn't eat  8-Dec 153 149     9-Dec 92   276   10-Dec 76 144     11-Dec 75  70 219   12-Dec 108   218   13-Dec 228   149   14-Dec 230 205     15-Dec 101  158 125 Threw up all day  16-Dec 77   211   17-Dec 124       135 184 124 213    . Current meal patterns:  o Breakfast: PB sandwich; sometimes eggs; flavored water; avoids sodas, juice, milk  o Snacks: Generally doesn't eat this meal, as she wakes up around lunch time o Supper: Frozen fish dinners; various things that her daughter brings  o Drinks: occasional soda, but not every day. No more than 1 a day of the small cans  . Cardiovascular risk reduction: o Current hypertensive regimen: amlodipine 2.5 mg daily, losartan 50 mg daily; has not been checking o Current hyperlipidemia regimen: atorvastatin 80 mg daily; LDL now at goal <70  Pharmacist Clinical Goal(s):  Marland Kitchen Over the next 90 days, patient with work with PharmD and primary care provider to address optimized management of diabetes  Interventions: . Comprehensive medication review performed, medication list updated in electronic medical record . Difficult to determine next steps of medication management, given no distinct patterns of hyper or hypoglycemia. Increasing basal insulin may result in episodes of  hypoglycemia throughout the day. Previously, daughter expressed severe concerns about adding sulfonylurea or mealtime insulin, given patient's memory concerns and risk of hypoglycemia. GLP1 is maxed, SGLT2 or metformin inappropriate given patient's renal dysfunction . Recommended patient start keeping a food diary, and correlate w/ BG readings. Patient notes she has several 2021 calendars at home, and it will be easy to start keeping track of what she eats. Hopefully, this will allow for more data to guide dietary interventions.  . Patient will also start checking home BP readings and record for future review.   Patient Self Care Activities:  . Patient will check blood glucose daily, document, and provide at future appointments . Patient will check BP a few days a week, document, and provide at future appointments . Patient will take medications as prescribed . Patient will report any questions or concerns to provider   Please see past updates related to this goal by clicking on the "Past Updates" button in the selected goal         The patient verbalized understanding of instructions provided today and declined a print copy of patient instruction materials.   Plan: -  Scheduled f/u call in ~ 4 weeks (05/30/19 at 3 pm) for medication management review.   [name], PharmD, [place], CPP Clinical Pharmacist [place] [phone]

## 2019-04-25 NOTE — Chronic Care Management (AMB) (Signed)
Chronic Care Management   Follow Up Note   04/25/2019 Name: Kelly [name] MRN: [id number] DOB: January 21, 1941  Referred by: [name], MD Reason for referral : Chronic Care Management (Medication Management)   Kelly [name] is a 78 y.o. year old female who is a primary care patient of Caryl Bis, Angela Adam, MD. The CCM team was consulted for assistance with chronic disease management and care coordination needs.    Contacted patient for medication management review.   Review of patient status, including review of consultants reports, relevant laboratory and other test results, and collaboration with appropriate care team members and the patient's provider was performed as part of comprehensive patient evaluation and provision of chronic care management services.    SDOH (Social Determinants of Health) screening performed today: Physical Activity. See Care Plan for related entries.   Outpatient Encounter Medications as of 04/25/2019  Medication Sig Note  . amLODipine (NORVASC) 2.5 MG tablet TAKE 1 TABLET DAILY 01/03/2019: Lunchtime  . atorvastatin (LIPITOR) 80 MG tablet Take 1 tablet (80 mg total) by mouth daily.   Marland Kitchen glucose blood (ACCU-CHEK AVIVA PLUS) test strip Use to check blood sugar up to 4 times daily   . Insulin Glargine (BASAGLAR KWIKPEN) 100 UNIT/ML SOPN INJECT 20 UNITS TOTAL      SUBCUTANEOUSLY DAILY   . Insulin Pen Needle (BD PEN NEEDLE NANO U/F) 32G X 4 MM MISC USE DAILY WITH VICTOZA AND BASAGLAR   . Lancets MISC Use up to 4 times daily to check blood sugars.   Marland Kitchen losartan (COZAAR) 50 MG tablet TAKE 1 TABLET DAILY   . VICTOZA 18 MG/3ML SOPN INJECT 0.3ML (1.8MG  TOTAL) SUBCUTANEOUSLY DAILY   . ALPRAZolam (XANAX) 0.5 MG tablet Take 0.5 tablets (0.25 mg total) by mouth 2 (two) times daily as needed for anxiety. 05/21/2018: Uses ~2x a month   . aspirin EC 81 MG tablet Take 81 mg by mouth daily.   . calcitRIOL (ROCALTROL) 0.25 MCG capsule Take 0.25 mcg by mouth  daily.   . cyclobenzaprine (FLEXERIL) 10 MG tablet TAKE 1/2 TABLET (5MG  TOTAL)3 TIMES A DAY AS NEEDED    FOR MUSCLE SPASMS 01/31/2019: Using ~1/week for leg cramps  . denosumab (PROLIA) 60 MG/ML SOSY injection Inject 60 mg into the skin every 6 (six) months. 09/24/2018: Last dose in April   . furosemide (LASIX) 40 MG tablet Take 1 tablet (40 mg total) by mouth daily as needed. 11/06/2017: Takes very sporadically PRN lower extremity edema  . gabapentin (NEURONTIN) 100 MG capsule Take 100 mg by mouth 2 (two) times daily. 12/06/2018: 1 daily   . HYDROcodone-acetaminophen (NORCO) 5-325 MG tablet Norco 5 mg-325 mg tablet  Take 1 tablet twice a day by oral route as needed. 01/03/2019: PRN, 0-1 daily  . PARoxetine (PAXIL-CR) 25 MG 24 hr tablet TAKE 1 TABLET DAILY   . potassium chloride (K-DUR) 10 MEQ tablet potassium chloride ER 10 mEq tablet,extended release 11/06/2017: Only takes when she takes furosemide   No facility-administered encounter medications on file as of 04/25/2019.     Goals Addressed            This Visit's Progress     Patient Stated   . "I want to work on my diabetes" (pt-stated)       Current Barriers:  . Diabetes: uncontrolled at goal <7.5%; most recent A1c 7.6% o Had a fall, seen by Dr. Caryl Bis 04/15/2019 for evaluation o Reviewed recent lab work. Does report f/u w/ Dr. Candiss Norse scheduled  07/22/2018 . Current antihyperglycemic regimen: Victoza 1.8 mg daily, Basaglar 20 units; denies missing any doses   . Current glucose readings:  Fasting After Lunch Before Supper Bedtime    1-Dec 156      2-Dec 177      3-Dec 147 142 205 104   4-Dec 123      5-Dec 156 280     6-Dec 162   219   7-Dec 105  65 305 Didn't eat  8-Dec 153 149     9-Dec 92   276   10-Dec 76 144     11-Dec 75  70 219   12-Dec 108   218   13-Dec 228   149   14-Dec 230 205     15-Dec 101  158 125 Threw up all day  16-Dec 77   211   17-Dec 124       135 184 124 213    . Current meal patterns:  o Breakfast:  PB sandwich; sometimes eggs; flavored water; avoids sodas, juice, milk  o Snacks: Generally doesn't eat this meal, as she wakes up around lunch time o Supper: Frozen fish dinners; various things that her daughter brings  o Drinks: occasional soda, but not every day. No more than 1 a day of the small cans  . Cardiovascular risk reduction: o Current hypertensive regimen: amlodipine 2.5 mg daily, losartan 50 mg daily; has not been checking o Current hyperlipidemia regimen: atorvastatin 80 mg daily; LDL now at goal <70  Pharmacist Clinical Goal(s):  Marland Kitchen Over the next 90 days, patient with work with PharmD and primary care provider to address optimized management of diabetes  Interventions: . Comprehensive medication review performed, medication list updated in electronic medical record . Difficult to determine next steps of medication management, given no distinct patterns of hyper or hypoglycemia. Increasing basal insulin may result in episodes of hypoglycemia throughout the day. Previously, daughter expressed severe concerns about adding sulfonylurea or mealtime insulin, given patient's memory concerns and risk of hypoglycemia. GLP1 is maxed, SGLT2 or metformin inappropriate given patient's renal dysfunction . Recommended patient start keeping a food diary, and correlate w/ BG readings. Patient notes she has several 2021 calendars at home, and it will be easy to start keeping track of what she eats. Hopefully, this will allow for more data to guide dietary interventions.  . Patient will also start checking home BP readings and record for future review.   Patient Self Care Activities:  . Patient will check blood glucose daily, document, and provide at future appointments . Patient will check BP a few days a week, document, and provide at future appointments . Patient will take medications as prescribed . Patient will report any questions or concerns to provider   Please see past updates related to  this goal by clicking on the "Past Updates" button in the selected goal          Plan: - Scheduled f/u call in ~ 4 weeks (05/30/19 at 3 pm) for medication management review.   [name], PharmD, [place], CPP Clinical Pharmacist [place] [phone]

## 2019-05-01 ENCOUNTER — Encounter: Payer: Self-pay | Admitting: Family Medicine

## 2019-05-03 ENCOUNTER — Encounter: Payer: Self-pay | Admitting: Family Medicine

## 2019-05-16 ENCOUNTER — Other Ambulatory Visit: Payer: Self-pay | Admitting: Family Medicine

## 2019-05-16 DIAGNOSIS — E1122 Type 2 diabetes mellitus with diabetic chronic kidney disease: Secondary | ICD-10-CM

## 2019-05-16 DIAGNOSIS — N184 Chronic kidney disease, stage 4 (severe): Secondary | ICD-10-CM

## 2019-05-16 DIAGNOSIS — Z794 Long term (current) use of insulin: Secondary | ICD-10-CM

## 2019-05-24 ENCOUNTER — Other Ambulatory Visit: Payer: Self-pay | Admitting: Family Medicine

## 2019-05-28 DIAGNOSIS — H04123 Dry eye syndrome of bilateral lacrimal glands: Secondary | ICD-10-CM | POA: Diagnosis not present

## 2019-05-30 ENCOUNTER — Ambulatory Visit (INDEPENDENT_AMBULATORY_CARE_PROVIDER_SITE_OTHER): Payer: Medicare Other | Admitting: Pharmacist

## 2019-05-30 DIAGNOSIS — E1122 Type 2 diabetes mellitus with diabetic chronic kidney disease: Secondary | ICD-10-CM | POA: Diagnosis not present

## 2019-05-30 DIAGNOSIS — Z794 Long term (current) use of insulin: Secondary | ICD-10-CM

## 2019-05-30 DIAGNOSIS — I10 Essential (primary) hypertension: Secondary | ICD-10-CM

## 2019-05-30 DIAGNOSIS — N184 Chronic kidney disease, stage 4 (severe): Secondary | ICD-10-CM | POA: Diagnosis not present

## 2019-05-30 NOTE — Chronic Care Management (AMB) (Signed)
Chronic Care Management   Follow Up Note   05/30/2019 Name: Kelly [name] MRN: [id number] DOB: [birth year]  Referred by: Kelly Haven, MD Reason for referral : Chronic Care Management (Medication Management)   Kelly [name] is a 79 y.o. year old female who is a primary care patient of Kelly [name], Kelly Adam, MD. The CCM team was consulted for assistance with chronic disease management and care coordination needs.   Contacted patient for medication management review.   Review of patient status, including review of consultants reports, relevant laboratory and other test results, and collaboration with appropriate care team members and the patient's provider was performed as part of comprehensive patient evaluation and provision of chronic care management services.    SDOH (Social Determinants of Health) screening performed today: None. See Care Plan for related entries.   Outpatient Encounter Medications as of 05/30/2019  Medication Sig Note  . ALPRAZolam (XANAX) 0.5 MG tablet Take 0.5 tablets (0.25 mg total) by mouth 2 (two) times daily as needed for anxiety. (Patient taking differently: Take 0.5 mg by mouth 2 (two) times daily as needed for anxiety. ) 05/21/2018: Uses ~2x a month   . amLODipine (NORVASC) 2.5 MG tablet TAKE 1 TABLET DAILY   . aspirin EC 81 MG tablet Take 81 mg by mouth daily.   Marland Kitchen atorvastatin (LIPITOR) 80 MG tablet Take 1 tablet (80 mg total) by mouth daily.   . calcitRIOL (ROCALTROL) 0.25 MCG capsule Take 0.25 mcg by mouth daily.   . cholecalciferol (VITAMIN D3) 25 MCG (1000 UNIT) tablet Take 1,000 Units by mouth daily.   . cyclobenzaprine (FLEXERIL) 10 MG tablet TAKE 1/2 TABLET (5MG  TOTAL)3 TIMES A DAY AS NEEDED    FOR MUSCLE SPASMS 01/31/2019: Using ~1/week for leg cramps  . denosumab (PROLIA) 60 MG/ML SOSY injection Inject 60 mg into the skin every 6 (six) months. 09/24/2018: Last dose in April   . docusate sodium (COLACE) 100 MG capsule Take 100 mg by  mouth 2 (two) times daily.   Marland Kitchen gabapentin (NEURONTIN) 100 MG capsule Take 100 mg by mouth 2 (two) times daily. 12/06/2018: 1 daily   . glucosamine-chondroitin 500-400 MG tablet Take 1 tablet by mouth 3 (three) times daily.   Marland Kitchen glucose blood (ACCU-CHEK AVIVA PLUS) test strip Use to check blood sugar up to 4 times daily   . HYDROcodone-acetaminophen (NORCO) 5-325 MG tablet Norco 5 mg-325 mg tablet  Take 1 tablet twice a day by oral route as needed. 01/03/2019: PRN, 0-1 daily  . Insulin Glargine (BASAGLAR KWIKPEN) 100 UNIT/ML SOPN INJECT 20 UNITS TOTAL      SUBCUTANEOUSLY DAILY   . Insulin Pen Needle (BD PEN NEEDLE NANO U/F) 32G X 4 MM MISC USE DAILY WITH VICTOZA AND BASAGLAR   . Lancets MISC Use up to 4 times daily to check blood sugars.   Marland Kitchen losartan (COZAAR) 50 MG tablet TAKE 1 TABLET DAILY   . Multiple Minerals-Vitamins (CALCIUM CITRATE PLUS PO) Take by mouth.   Marland Kitchen PARoxetine (PAXIL-CR) 25 MG 24 hr tablet TAKE 1 TABLET DAILY   . VICTOZA 18 MG/3ML SOPN INJECT 0.3ML (1.8MG  TOTAL) SUBCUTANEOUSLY DAILY   . furosemide (LASIX) 40 MG tablet Take 1 tablet (40 mg total) by mouth daily as needed. (Patient not taking: Reported on 05/30/2019) 11/06/2017: Takes very sporadically PRN lower extremity edema  . potassium chloride (K-DUR) 10 MEQ tablet potassium chloride ER 10 mEq tablet,extended release 11/06/2017: Only takes when she takes furosemide   No facility-administered encounter medications on  file as of 05/30/2019.     Objective:   Goals Addressed            This Visit's Progress     Patient Stated   . "I want to work on my diabetes" (pt-stated)       Current Barriers:  . Diabetes: uncontrolled at goal <7.5%; most recent A1c 7.6% o Reports enjoying documenting daily meals, as this has helped her stay accountable . Current antihyperglycemic regimen: Victoza 1.8 mg daily, Basaglar 20 units; reports missing dose  . Reports 1 episode of midday hypoglycemia to 46 . Current glucose readings:  Fasting  Breakfast Lunch Before Supper Supper  Post supper Bedtime Snack Bedtime   7-Jan 129   92 Banana sandwich and coke      8-Jan 92 1/4 sub sandwich, coke  124 Broiled flounder, veggies  Oranges    9-Jan 125 2 scrambled eggs  112 Cheese and crackers, popcorn      10-Jan 95 2 scrambled eggs, pepsi  46 -> treated, 110 Broiled flounder, veggies      11-Jan 148 Cheese and soda crackers   Spaghetti and Salad, but then threw up  169 Throwing Up  12-Jan 125 2 fried eggs, toast + sugar free jam Chicken livers, sugar free jellow 194 Popcorn       13-Jan 151  Cherries 121 Hamburger, fries, coke 196  196   14-Jan 124 Salted peanuts, coke   Banana sandwich       15-Jan 131 Banana Sandwich   Ribeye steak 196     16-Jan 100 Banana Sandwich and orange pudding, banana sandwich 105 chicken salad sandwich  ice cream popcicle   17-Jan 150 Banana       Throwing Up  18-Jan 86  Chicken salad sandwich, lemonade 103 Birthday Dinner - steak, potato   192 Forgot insulin today  19-Jan 220    [place] 254  267 Ate leftover supper for 4 am snack  20-Jan 195 Chicken salad sandwich, lemonade 87 chicken salad sandwich, lemonade, popcile   135   21-Jan 159 Cereal w/ milk, blueberries         . Cardiovascular risk reduction: o Current hypertensive regimen: amlodipine 2.5 mg daily, losartan 50 mg daily; Current BP readings: 1/1 (148/83 HR 75), 1/2 (140/79 HR 85); 1/5 (157/90 HR 83), 1/7 (154/78 HR 80), 1/10 (155/87 HR 77), 1/15 (175/90 HR 79) o Current hyperlipidemia regimen: atorvastatin 80 mg daily; LDL now at goal <70  Pharmacist Clinical Goal(s):  Marland Kitchen Over the next 90 days, patient with work with PharmD and primary care provider to address optimized management of diabetes  Interventions: . Comprehensive medication review performed, medication list updated in electronic medical record . Praised patient for extensive food diary logs . Extensive dietary review. Recommend choosing low carb snacks (single serving popcorn) over high  carb sweets (fruit, popsicle) if she wants a snack before bed. Recommended drinking water or diet lemonade/sodas rather than full sugar. Patient will work on this.  . Reviewed BP readings. As office readings have been at goal and sometimes low, will defer any antihypertensive adjustments at this time, until patient's home BP technique has been checked. She confirms that she places cuff on upper part of arm and sits with feet flat on the ground, but doesn't always sit still for 5 minutes before. Encouraged her to bring BP machine to next appointment with any provider for teaching and technique evaluation. Encouraged to start checking BP daily at home.   Patient  Self Care Activities:  . Patient will check blood glucose daily, document, and provide at future appointments . Patient will check BP daily document, and provide at future appointments . Patient will take medications as prescribed . Patient will report any questions or concerns to provider   Please see past updates related to this goal by clicking on the "Past Updates" button in the selected goal          Plan:  - Scheduled f/u call 06/24/19   [name], PharmD, BCACP, [place] Pharmacist [place] [phone]

## 2019-05-30 NOTE — Patient Instructions (Signed)
Visit Information  Goals Addressed            This Visit's Progress     Patient Stated   . "I want to work on my diabetes" (pt-stated)       Current Barriers:  . Diabetes: uncontrolled at goal <7.5%; most recent A1c 7.6% o Reports enjoying documenting daily meals, as this has helped her stay accountable . Current antihyperglycemic regimen: Victoza 1.8 mg daily, Basaglar 20 units; reports missing dose  . Reports 1 episode of midday hypoglycemia to 46 . Current glucose readings:  Fasting Breakfast Lunch Before Supper Supper  Post supper Bedtime Snack Bedtime   7-Jan 129   92 Banana sandwich and coke      8-Jan 92 1/4 sub sandwich, coke  124 Broiled flounder, veggies  Oranges    9-Jan 125 2 scrambled eggs  112 Cheese and crackers, popcorn      10-Jan 95 2 scrambled eggs, pepsi  46 -> treated, 110 Broiled flounder, veggies      11-Jan 148 Cheese and soda crackers   Spaghetti and Salad, but then threw up  169 Throwing Up  12-Jan 125 2 fried eggs, toast + sugar free jam Chicken livers, sugar free jellow 194 Popcorn       13-Jan 151  Cher[place] 121 Hamburger, fries, coke 196  196   14-Jan 124 Salted peanuts, coke   Banana sandwich       15-Jan 131 Banana Sandwich   Ribeye steak 196     16-Jan 100 Banana Sandwich and orange pudding, banana sandwich 105 chicken salad sandwich  ice cream popcicle   17-Jan 150 Banana       Throwing Up  18-Jan 86  Chicken salad sandwich, lemonade 103 Birthday Dinner - steak, potato   192 Forgot insulin today  19-Jan 220    [place] 254  267 Ate leftover supper for 4 am snack  20-Jan 195 Chicken salad sandwich, lemonade 87 chicken salad sandwich, lemonade, popcile   135   21-Jan 159 Cereal w/ milk, blueberries         . Cardiovascular risk reduction: o Current hypertensive regimen: amlodipine 2.5 mg daily, losartan 50 mg daily; Current BP readings: 1/1 (148/83 HR 75), 1/2 (140/79 HR 85); 1/5 (157/90 HR 83), 1/7 (154/78 HR 80), 1/10 (155/87 HR 77), 1/15 (175/90 HR  79) o Current hyperlipidemia regimen: atorvastatin 80 mg daily; LDL now at goal <70  Pharmacist Clinical Goal(s):  Marland Kitchen Over the next 90 days, patient with work with PharmD and primary care provider to address optimized management of diabetes  Interventions: . Comprehensive medication review performed, medication list updated in electronic medical record . Praised patient for extensive food diary logs . Extensive dietary review. Recommend choosing low carb snacks (single serving popcorn) over high carb sweets (fruit, popsicle) if she wants a snack before bed. Recommended drinking water or diet lemonade/sodas rather than full sugar. Patient will work on this.  . Reviewed BP readings. As office readings have been at goal and sometimes low, will defer any antihypertensive adjustments at this time, until patient's home BP technique has been checked. She confirms that she places cuff on upper part of arm and sits with feet flat on the ground, but doesn't always sit still for 5 minutes before. Encouraged her to bring BP machine to next appointment with any provider for teaching and technique evaluation. Encouraged to start checking BP daily at home.   Patient Self Care Activities:  . Patient will check blood  glucose daily, document, and provide at future appointments . Patient will check BP daily document, and provide at future appointments . Patient will take medications as prescribed . Patient will report any questions or concerns to provider   Please see past updates related to this goal by clicking on the "Past Updates" button in the selected goal         The patient verbalized understanding of instructions provided today and declined a print copy of patient instruction materials.    Plan:  - Scheduled f/u call 06/24/19   [name], PharmD, BCACP, CPP Clinical Pharmacist [place] [phone]

## 2019-06-10 DIAGNOSIS — T2101XA Burn of unspecified degree of chest wall, initial encounter: Secondary | ICD-10-CM | POA: Diagnosis not present

## 2019-06-10 DIAGNOSIS — R52 Pain, unspecified: Secondary | ICD-10-CM | POA: Diagnosis not present

## 2019-06-10 DIAGNOSIS — M25519 Pain in unspecified shoulder: Secondary | ICD-10-CM | POA: Diagnosis not present

## 2019-06-10 DIAGNOSIS — M545 Low back pain: Secondary | ICD-10-CM | POA: Diagnosis not present

## 2019-06-10 DIAGNOSIS — M5032 Other cervical disc degeneration, mid-cervical region, unspecified level: Secondary | ICD-10-CM | POA: Diagnosis not present

## 2019-06-10 DIAGNOSIS — M6281 Muscle weakness (generalized): Secondary | ICD-10-CM | POA: Diagnosis not present

## 2019-06-10 DIAGNOSIS — M542 Cervicalgia: Secondary | ICD-10-CM | POA: Diagnosis not present

## 2019-06-10 DIAGNOSIS — M7062 Trochanteric bursitis, left hip: Secondary | ICD-10-CM | POA: Diagnosis not present

## 2019-06-10 DIAGNOSIS — M5136 Other intervertebral disc degeneration, lumbar region: Secondary | ICD-10-CM | POA: Diagnosis not present

## 2019-06-10 DIAGNOSIS — E1165 Type 2 diabetes mellitus with hyperglycemia: Secondary | ICD-10-CM | POA: Diagnosis not present

## 2019-06-10 DIAGNOSIS — M25572 Pain in left ankle and joints of left foot: Secondary | ICD-10-CM | POA: Diagnosis not present

## 2019-06-10 DIAGNOSIS — Q762 Congenital spondylolisthesis: Secondary | ICD-10-CM | POA: Diagnosis not present

## 2019-06-24 ENCOUNTER — Ambulatory Visit (INDEPENDENT_AMBULATORY_CARE_PROVIDER_SITE_OTHER): Payer: Medicare Other | Admitting: Pharmacist

## 2019-06-24 DIAGNOSIS — Z794 Long term (current) use of insulin: Secondary | ICD-10-CM | POA: Diagnosis not present

## 2019-06-24 DIAGNOSIS — E1122 Type 2 diabetes mellitus with diabetic chronic kidney disease: Secondary | ICD-10-CM

## 2019-06-24 DIAGNOSIS — N184 Chronic kidney disease, stage 4 (severe): Secondary | ICD-10-CM | POA: Diagnosis not present

## 2019-06-24 DIAGNOSIS — I10 Essential (primary) hypertension: Secondary | ICD-10-CM | POA: Diagnosis not present

## 2019-06-24 NOTE — Patient Instructions (Signed)
Visit Information  Goals Addressed            This Visit's Progress     Patient Stated   . "I want to work on my diabetes" (pt-stated)       Current Barriers:  . Diabetes: uncontrolled at goal <7.5%; most recent A1c 7.6% . Current antihyperglycemic regimen: Victoza 1.8 mg daily, Basaglar 20 units daily . Denies hypoglycemia  . Current meal patterns:  o Notes she has been snacking on a lot of red grapes lately. Will eat the grapes straight off the bunch o Drinking a mini coke every other day . Current glucose readings and BP readings (BP readings are at different times of the day, but she did not document which time):  Date SBP DBP HR Fasting Before Supper After Supper Bedtime  1-Feb    97 244  159  2-Feb    123 110 128 197  3-Feb    102   186  4-Feb 170 86 70 129 243 194 94  5-Feb 171 90 71 159 226 229   6-Feb 132 90 70 137 275    7-Feb 175 88 72 82 160  208  8-Feb 165 78 67 127     9-Feb 159 83 67 119 127  227  10-Feb 107 86 66 177   94  11-Feb 139 88 70 87 93    12-Feb 157 89 67 141     13-Feb 134 78 73 124     14-Feb 155 85 73 115 130    15-Feb 168 87 66      AVG 153 86 69 128 179 184 166   . Cardiovascular risk reduction: o Current hypertensive regimen: amlodipine 2.5 mg daily, losartan 50 mg QAM o Current hyperlipidemia regimen: atorvastatin 80 mg daily; LDL now at goal <70  Pharmacist Clinical Goal(s):  Marland Kitchen Over the next 90 days, patient with work with PharmD and primary care provider to address optimized management of diabetes  Interventions: . Comprehensive medication review performed, medication list updated in electronic medical record . Reviewed BG and BP readings. Congratulated patient on monitoring BP/BG regularly.  . Continue Basaglar 20 units daily and Victoza 1.8 mg daily. Will avoid increasing dose to prevent hypoglycemia. Reviewed that post prandial readings need to be generally <200, and that she should plan to adjust her diet accordingly. May consider  low dose glipizide IR moving forward to help target dietary excursions . Encouraged to document when she has sodas to help draw correlation with higher blood sugar readings . Discussed fixing a portion size of grapes in a small bowl, rather than eating off of the bunch, to help manage serving size.  Kelly [name] if higher BP readings are in the mornings, before she takes antihypertensives. Recommended she move amlodipine to evening administration, continue losartan QAM. Encouraged her to document what time of the day her BP readings are moving forward to help determine any daily patterns. Will defer amlodipine dose increase for now due to significant variability in BP readings.   Patient Self Care Activities:  . Patient will check blood glucose daily, document, and provide at future appointments . Patient will check BP daily document, and provide at future appointments . Patient will take medications as prescribed . Patient will report any questions or concerns to provider   Please see past updates related to this goal by clicking on the "Past Updates" button in the selected goal         The patient  verbalized understanding of instructions provided today and declined a print copy of patient instruction materials.   Plan:  - Scheduled f/u call 07/25/19  Kelly [name], PharmD, BCACP, CPP Clinical Pharmacist [place] [phone]

## 2019-06-24 NOTE — Chronic Care Management (AMB) (Signed)
Chronic Care Management   Follow Up Note   06/24/2019 Name: CODEE BLOODWORTH MRN: [id number] DOB: 1940/12/02  Referred by: [name], MD Reason for referral : Chronic Care Management (Medication Management)   Angeliah ARAH ARO is a 79 y.o. year old female who is a primary care patient of Caryl Bis, Angela Adam, MD. The CCM team was consulted for assistance with chronic disease management and care coordination needs.   Contacted patient for medication management review.   Review of patient status, including review of consultants reports, relevant laboratory and other test results, and collaboration with appropriate care team members and the patient's provider was performed as part of comprehensive patient evaluation and provision of chronic care management services.    SDOH (Social Determinants of Health) screening performed today: None. See Care Plan for related entries.   Outpatient Encounter Medications as of 06/24/2019  Medication Sig Note  . amLODipine (NORVASC) 2.5 MG tablet TAKE 1 TABLET DAILY   . losartan (COZAAR) 50 MG tablet TAKE 1 TABLET DAILY   . ALPRAZolam (XANAX) 0.5 MG tablet Take 0.5 tablets (0.25 mg total) by mouth 2 (two) times daily as needed for anxiety. (Patient taking differently: Take 0.5 mg by mouth 2 (two) times daily as needed for anxiety. ) 05/21/2018: Uses ~2x a month   . aspirin EC 81 MG tablet Take 81 mg by mouth daily.   Marland Kitchen atorvastatin (LIPITOR) 80 MG tablet Take 1 tablet (80 mg total) by mouth daily.   . calcitRIOL (ROCALTROL) 0.25 MCG capsule Take 0.25 mcg by mouth daily.   . cholecalciferol (VITAMIN D3) 25 MCG (1000 UNIT) tablet Take 1,000 Units by mouth daily.   . cyclobenzaprine (FLEXERIL) 10 MG tablet TAKE 1/2 TABLET (5MG  TOTAL)3 TIMES A DAY AS NEEDED    FOR MUSCLE SPASMS 01/31/2019: Using ~1/week for leg cramps  . denosumab (PROLIA) 60 MG/ML SOSY injection Inject 60 mg into the skin every 6 (six) months. 09/24/2018: Last dose in April   .  docusate sodium (COLACE) 100 MG capsule Take 100 mg by mouth 2 (two) times daily.   . furosemide (LASIX) 40 MG tablet Take 1 tablet (40 mg total) by mouth daily as needed. (Patient not taking: Reported on 05/30/2019) 11/06/2017: Takes very sporadically PRN lower extremity edema  . gabapentin (NEURONTIN) 100 MG capsule Take 100 mg by mouth 2 (two) times daily. 12/06/2018: 1 daily   . glucosamine-chondroitin 500-400 MG tablet Take 1 tablet by mouth 3 (three) times daily.   Marland Kitchen glucose blood (ACCU-CHEK AVIVA PLUS) test strip Use to check blood sugar up to 4 times daily   . HYDROcodone-acetaminophen (NORCO) 5-325 MG tablet Norco 5 mg-325 mg tablet  Take 1 tablet twice a day by oral route as needed. 01/03/2019: PRN, 0-1 daily  . Insulin Glargine (BASAGLAR KWIKPEN) 100 UNIT/ML SOPN INJECT 20 UNITS TOTAL      SUBCUTANEOUSLY DAILY   . Insulin Pen Needle (BD PEN NEEDLE NANO U/F) 32G X 4 MM MISC USE DAILY WITH VICTOZA AND BASAGLAR   . Lancets MISC Use up to 4 times daily to check blood sugars.   . Multiple Minerals-Vitamins (CALCIUM CITRATE PLUS PO) Take by mouth.   Marland Kitchen PARoxetine (PAXIL-CR) 25 MG 24 hr tablet TAKE 1 TABLET DAILY   . potassium chloride (K-DUR) 10 MEQ tablet potassium chloride ER 10 mEq tablet,extended release 11/06/2017: Only takes when she takes furosemide  . VICTOZA 18 MG/3ML SOPN INJECT 0.3ML (1.8MG  TOTAL) SUBCUTANEOUSLY DAILY    No facility-administered encounter medications on  file as of 06/24/2019.     Objective:   Goals Addressed            This Visit's Progress     Patient Stated   . "I want to work on my diabetes" (pt-stated)       Current Barriers:  . Diabetes: uncontrolled at goal <7.5%; most recent A1c 7.6% . Current antihyperglycemic regimen: Victoza 1.8 mg daily, Basaglar 20 units daily . Denies hypoglycemia  . Current meal patterns:  o Notes she has been snacking on a lot of red grapes lately. Will eat the grapes straight off the bunch o Drinking a mini coke every other  day . Current glucose readings and BP readings (BP readings are at different times of the day, but she did not document which time):  Date SBP DBP HR Fasting Before Supper After Supper Bedtime  1-Feb    97 244  159  2-Feb    123 110 128 197  3-Feb    102   186  4-Feb 170 86 70 129 243 194 94  5-Feb 171 90 71 159 226 229   6-Feb 132 90 70 137 275    7-Feb 175 88 72 82 160  208  8-Feb 165 78 67 127     9-Feb 159 83 67 119 127  227  10-Feb 107 86 66 177   94  11-Feb 139 88 70 87 93    12-Feb 157 89 67 141     13-Feb 134 78 73 124     14-Feb 155 85 73 115 130    15-Feb 168 87 66      AVG 153 86 69 128 179 184 166   . Cardiovascular risk reduction: o Current hypertensive regimen: amlodipine 2.5 mg daily, losartan 50 mg QAM o Current hyperlipidemia regimen: atorvastatin 80 mg daily; LDL now at goal <70  Pharmacist Clinical Goal(s):  Marland Kitchen Over the next 90 days, patient with work with PharmD and primary care provider to address optimized management of diabetes  Interventions: . Comprehensive medication review performed, medication list updated in electronic medical record . Reviewed BG and BP readings. Congratulated patient on monitoring BP/BG regularly.  . Continue Basaglar 20 units daily and Victoza 1.8 mg daily. Will avoid increasing dose to prevent hypoglycemia. Reviewed that post prandial readings need to be generally <200, and that she should plan to adjust her diet accordingly. May consider low dose glipizide IR moving forward to help target dietary excursions . Encouraged to document when she has sodas to help draw correlation with higher blood sugar readings . Discussed fixing a portion size of grapes in a small bowl, rather than eating off of the bunch, to help manage serving size.  Geanie Logan if higher BP readings are in the mornings, before she takes antihypertensives. Recommended she move amlodipine to evening administration, continue losartan QAM. Encouraged her to document what  time of the day her BP readings are moving forward to help determine any daily patterns. Will defer amlodipine dose increase for now due to significant variability in BP readings.   Patient Self Care Activities:  . Patient will check blood glucose daily, document, and provide at future appointments . Patient will check BP daily document, and provide at future appointments . Patient will take medications as prescribed . Patient will report any questions or concerns to provider   Please see past updates related to this goal by clicking on the "Past Updates" button in the selected goal  Plan:  - Scheduled f/u call 07/25/19  [name], PharmD, BCACP, CPP Clinical Pharmacist [place] [phone]

## 2019-06-25 DIAGNOSIS — H02883 Meibomian gland dysfunction of right eye, unspecified eyelid: Secondary | ICD-10-CM | POA: Diagnosis not present

## 2019-06-27 ENCOUNTER — Ambulatory Visit: Payer: Self-pay | Admitting: Pharmacist

## 2019-06-27 DIAGNOSIS — I10 Essential (primary) hypertension: Secondary | ICD-10-CM

## 2019-06-27 NOTE — Patient Instructions (Signed)
Visit Information  Goals Addressed            This Visit's Progress     Patient Stated   . "I want to work on my diabetes" (pt-stated)       Current Barriers:  . Diabetes: uncontrolled at goal <7.5%; most recent A1c 7.6% o Calls asking about which medication we had talked about moving to the evenings . Current antihyperglycemic regimen: Victoza 1.8 mg daily, Basaglar 20 units daily . Current glucose readings and BP readings . Cardiovascular risk reduction: o Current hypertensive regimen: amlodipine 2.5 mg daily, losartan 50 mg QAM o Current hyperlipidemia regimen: atorvastatin 80 mg daily; LDL now at goal <70  Pharmacist Clinical Goal(s):  Marland Kitchen Over the next 90 days, patient with work with PharmD and primary care provider to address optimized management of diabetes  Interventions: . Reviewed that we are moving amlodipine to QPM. She verbalized understanding.  . Notes she is receiving first COVID vaccine tomorrow.   Patient Self Care Activities:  . Patient will check blood glucose daily, document, and provide at future appointments . Patient will check BP daily document, and provide at future appointments . Patient will take medications as prescribed . Patient will report any questions or concerns to provider   Please see past updates related to this goal by clicking on the "Past Updates" button in the selected goal         Patient verbalizes understanding of instructions provided today.   Plan:  - Will outreach patient as previously scheduled  [name], PharmD, [place], [place] Pharmacist [place] [phone]

## 2019-06-27 NOTE — Chronic Care Management (AMB) (Signed)
Chronic Care Management   Follow Up Note   06/27/2019 Name: Kelly [name] MRN: [id number] DOB: [birth year]  Referred by: [name], MD Reason for referral : Chronic Care Management (Medication Management)   Kelly [name] is a 79 y.o. year old female who is a primary care patient of Caryl Bis, Angela Adam, MD. The CCM team was consulted for assistance with chronic disease management and care coordination needs.    Contacted patient for medication management review.   Review of patient status, including review of consultants reports, relevant laboratory and other test results, and collaboration with appropriate care team members and the patient's provider was performed as part of comprehensive patient evaluation and provision of chronic care management services.    SDOH (Social Determinants of Health) assessments performed: No    Outpatient Encounter Medications as of 06/27/2019  Medication Sig Note  . ALPRAZolam (XANAX) 0.5 MG tablet Take 0.5 tablets (0.25 mg total) by mouth 2 (two) times daily as needed for anxiety. (Patient taking differently: Take 0.5 mg by mouth 2 (two) times daily as needed for anxiety. ) 05/21/2018: Uses ~2x a month   . amLODipine (NORVASC) 2.5 MG tablet TAKE 1 TABLET DAILY   . aspirin EC 81 MG tablet Take 81 mg by mouth daily.   Marland Kitchen atorvastatin (LIPITOR) 80 MG tablet Take 1 tablet (80 mg total) by mouth daily.   . calcitRIOL (ROCALTROL) 0.25 MCG capsule Take 0.25 mcg by mouth daily.   . cholecalciferol (VITAMIN D3) 25 MCG (1000 UNIT) tablet Take 1,000 Units by mouth daily.   . cyclobenzaprine (FLEXERIL) 10 MG tablet TAKE 1/2 TABLET (5MG  TOTAL)3 TIMES A DAY AS NEEDED    FOR MUSCLE SPASMS 01/31/2019: Using ~1/week for leg cramps  . denosumab (PROLIA) 60 MG/ML SOSY injection Inject 60 mg into the skin every 6 (six) months. 09/24/2018: Last dose in April   . docusate sodium (COLACE) 100 MG capsule Take 100 mg by mouth 2 (two) times daily.   .  furosemide (LASIX) 40 MG tablet Take 1 tablet (40 mg total) by mouth daily as needed. (Patient not taking: Reported on 05/30/2019) 11/06/2017: Takes very sporadically PRN lower extremity edema  . gabapentin (NEURONTIN) 100 MG capsule Take 100 mg by mouth 2 (two) times daily. 12/06/2018: 1 daily   . glucosamine-chondroitin 500-400 MG tablet Take 1 tablet by mouth 3 (three) times daily.   Marland Kitchen glucose blood (ACCU-CHEK AVIVA PLUS) test strip Use to check blood sugar up to 4 times daily   . HYDROcodone-acetaminophen (NORCO) 5-325 MG tablet Norco 5 mg-325 mg tablet  Take 1 tablet twice a day by oral route as needed. 01/03/2019: PRN, 0-1 daily  . Insulin Glargine (BASAGLAR KWIKPEN) 100 UNIT/ML SOPN INJECT 20 UNITS TOTAL      SUBCUTANEOUSLY DAILY   . Insulin Pen Needle (BD PEN NEEDLE NANO U/F) 32G X 4 MM MISC USE DAILY WITH VICTOZA AND BASAGLAR   . Lancets MISC Use up to 4 times daily to check blood sugars.   Marland Kitchen losartan (COZAAR) 50 MG tablet TAKE 1 TABLET DAILY   . Multiple Minerals-Vitamins (CALCIUM CITRATE PLUS PO) Take by mouth.   Marland Kitchen PARoxetine (PAXIL-CR) 25 MG 24 hr tablet TAKE 1 TABLET DAILY   . potassium chloride (K-DUR) 10 MEQ tablet potassium chloride ER 10 mEq tablet,extended release 11/06/2017: Only takes when she takes furosemide  . VICTOZA 18 MG/3ML SOPN INJECT 0.3ML (1.8MG  TOTAL) SUBCUTANEOUSLY DAILY    No facility-administered encounter medications on file as of 06/27/2019.  Objective:   Goals Addressed            This Visit's Progress     Patient Stated   . "I want to work on my diabetes" (pt-stated)       Current Barriers:  . Diabetes: uncontrolled at goal <7.5%; most recent A1c 7.6% o Calls asking about which medication we had talked about moving to the evenings . Current antihyperglycemic regimen: Victoza 1.8 mg daily, Basaglar 20 units daily . Current glucose readings and BP readings . Cardiovascular risk reduction: o Current hypertensive regimen: amlodipine 2.5 mg daily,  losartan 50 mg QAM o Current hyperlipidemia regimen: atorvastatin 80 mg daily; LDL now at goal <70  Pharmacist Clinical Goal(s):  Marland Kitchen Over the next 90 days, patient with work with PharmD and primary care provider to address optimized management of diabetes  Interventions: . Reviewed that we are moving amlodipine to QPM. She verbalized understanding.  . Notes she is receiving first COVID vaccine tomorrow.   Patient Self Care Activities:  . Patient will check blood glucose daily, document, and provide at future appointments . Patient will check BP daily document, and provide at future appointments . Patient will take medications as prescribed . Patient will report any questions or concerns to provider   Please see past updates related to this goal by clicking on the "Past Updates" button in the selected goal          Plan:  - Will outreach patient as previously scheduled  [name], PharmD, [place], [place] Pharmacist [place] [phone]

## 2019-07-03 ENCOUNTER — Ambulatory Visit (INDEPENDENT_AMBULATORY_CARE_PROVIDER_SITE_OTHER): Payer: Medicare Other | Admitting: Family Medicine

## 2019-07-03 ENCOUNTER — Other Ambulatory Visit: Payer: Self-pay

## 2019-07-03 DIAGNOSIS — Z794 Long term (current) use of insulin: Secondary | ICD-10-CM | POA: Diagnosis not present

## 2019-07-03 DIAGNOSIS — I10 Essential (primary) hypertension: Secondary | ICD-10-CM | POA: Diagnosis not present

## 2019-07-03 DIAGNOSIS — E1122 Type 2 diabetes mellitus with diabetic chronic kidney disease: Secondary | ICD-10-CM

## 2019-07-03 DIAGNOSIS — N184 Chronic kidney disease, stage 4 (severe): Secondary | ICD-10-CM | POA: Diagnosis not present

## 2019-07-03 DIAGNOSIS — M7061 Trochanteric bursitis, right hip: Secondary | ICD-10-CM

## 2019-07-03 DIAGNOSIS — M7062 Trochanteric bursitis, left hip: Secondary | ICD-10-CM | POA: Diagnosis not present

## 2019-07-03 DIAGNOSIS — Z87898 Personal history of other specified conditions: Secondary | ICD-10-CM | POA: Insufficient documentation

## 2019-07-03 DIAGNOSIS — W19XXXA Unspecified fall, initial encounter: Secondary | ICD-10-CM | POA: Diagnosis not present

## 2019-07-03 NOTE — Assessment & Plan Note (Signed)
Chronic history of vomiting.  She underwent evaluation with GI and had a negative EGD.  She notes it infrequently occurs now.  She will monitor and if it worsens she will let us know.

## 2019-07-03 NOTE — Assessment & Plan Note (Signed)
Some improved blood pressure since changing amlodipine to nighttime dosing.  We will see if our clinical pharmacist can follow-up with her in a week or so to see how her blood pressure is doing prior to increasing the dose of her amlodipine.  She will continue her losartan.

## 2019-07-03 NOTE — Progress Notes (Addendum)
Virtual Visit via telephone Note  This visit type was conducted due to national recommendations for restrictions regarding the COVID-19 pandemic (e.g. social distancing).  This format is felt to be most appropriate for this patient at this time.  All issues noted in this document were discussed and addressed.  No physical exam was performed (except for noted visual exam findings with Video Visits).   I connected with Kelly [name] today at 10:30 AM EST by telephone and verified that I am speaking with the correct person using two identifiers. Location patient: home Location provider: work Persons participating in the virtual visit: patient, provider  I discussed the limitations, risks, security and privacy concerns of performing an evaluation and management service by telephone and the availability of in person appointments. I also discussed with the patient that there may be a patient responsible charge related to this service. The patient expressed understanding and agreed to proceed.  Interactive audio and video telecommunications were attempted between this provider and patient, however failed, due to patient having technical difficulties OR patient did not have access to video capability.  We continued and completed visit with audio only.   Reason for visit: follow-up  HPI: Bilateral hip pain: Patient notes this has been going on for some time.  Bothers her mostly when she lays in bed.  Does not matter what side she is on.  She notes they are tender to palpation laterally and anteriorly over her hip.  She takes Vicodin for back pain.  She also takes glucosamine.  Fall: She has not fallen since December when we previously did a visit regarding her fall.  She notes her lip is healing well though is still a little sore.  Diabetes: Typically 80-120 fasting.  Taking Basaglar 20 units once daily.  Also on Victoza.  No polyuria or polydipsia.  No hypoglycemia.  Hypertension: BP ranges  between 129-175/42-95.  Our clinical pharmacist advised her to change her amlodipine to nighttime dosing to see if that made a difference.  Her next 2 blood pressures were 129/75 and 136/42 though more recently she had a blood pressure 175/90.  She also continues on losartan.  No chest pain, shortness breath, or edema.  History of vomiting: Patient notes a history of randomly vomiting.  Will occur for 1 day and then resolve on its own.  Last occurred on January 29.  She notes no dysphagia though when she is vomiting it feels like the food is in her chest.  She previously saw GI for this issue and had an EGD that was negative for cause.   ROS: See pertinent positives and negatives per HPI.  Past Medical History:  Diagnosis Date  . Colon polyps   . Depression   . DM (diabetes mellitus), type 2 with renal complications ([place])   . Hyperlipidemia   . Hypertension   . Kidney stones   . Sleep apnea   . 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     Past Surgical History:  Procedure Laterality Date  . APPENDECTOMY    . BREAST BIOPSY Right 06/19/2018   affirm stereo/x clip/path pending  . cataract  03/2017  . CHOLECYSTECTOMY    . COLONOSCOPY    . COLONOSCOPY WITH PROPOFOL N/A 08/22/2017   Procedure: COLONOSCOPY WITH PROPOFOL;  Surgeon: Jonathon Bellows, MD;  Location: [facility] ENDOSCOPY;  Service: Gastroenterology;  Laterality: N/A;  . ESOPHAGOGASTRODUODENOSCOPY    . ESOPHAGOGASTRODUODENOSCOPY (EGD) WITH PROPOFOL N/A 03/23/2016   Procedure: ESOPHAGOGASTRODUODENOSCOPY (  EGD) WITH PROPOFOL;  Surgeon: Manya Silvas, MD;  Location: [facility] ENDOSCOPY;  Service: Endoscopy;  Laterality: N/A;  . FLEXIBLE SIGMOIDOSCOPY N/A 02/06/2018   Procedure: FLEXIBLE SIGMOIDOSCOPY;  Surgeon: Jonathon Bellows, MD;  Location: [facility] ENDOSCOPY;  Service: Gastroenterology;  Laterality: N/A;  . FLEXIBLE SIGMOIDOSCOPY N/A 12/03/2018   Procedure: FLEXIBLE SIGMOIDOSCOPY;  Surgeon: Jonathon Bellows, MD;  Location: [facility]  ENDOSCOPY;  Service: Gastroenterology;  Laterality: N/A;  . RECTAL PROLAPSE REPAIR  2012   x2    Family History  Problem Relation Age of Onset  . Stroke Mother   . Arthritis Mother   . Aortic aneurysm Sister   . Lung cancer Sister   . Heart attack Father 64  . Breast cancer Sister 17    SOCIAL HX: Non-smoker   Current Outpatient Medications:  .  ALPRAZolam (XANAX) 0.5 MG tablet, Take 0.5 tablets (0.25 mg total) by mouth 2 (two) times daily as needed for anxiety. (Patient taking differently: Take 0.5 mg by mouth 2 (two) times daily as needed for anxiety. ), Disp: 30 tablet, Rfl: 0 .  amLODipine (NORVASC) 2.5 MG tablet, TAKE 1 TABLET DAILY, Disp: 90 tablet, Rfl: 3 .  aspirin EC 81 MG tablet, Take 81 mg by mouth daily., Disp: , Rfl:  .  atorvastatin (LIPITOR) 80 MG tablet, Take 1 tablet (80 mg total) by mouth daily., Disp: 90 tablet, Rfl: 1 .  calcitRIOL (ROCALTROL) 0.25 MCG capsule, Take 0.25 mcg by mouth daily., Disp: , Rfl:  .  cholecalciferol (VITAMIN D3) 25 MCG (1000 UNIT) tablet, Take 1,000 Units by mouth daily., Disp: , Rfl:  .  cyclobenzaprine (FLEXERIL) 10 MG tablet, TAKE 1/2 TABLET (5MG  TOTAL)3 TIMES A DAY AS NEEDED    FOR MUSCLE SPASMS, Disp: 45 tablet, Rfl: 0 .  denosumab (PROLIA) 60 MG/ML SOSY injection, Inject 60 mg into the skin every 6 (six) months., Disp: , Rfl:  .  docusate sodium (COLACE) 100 MG capsule, Take 100 mg by mouth 2 (two) times daily., Disp: , Rfl:  .  furosemide (LASIX) 40 MG tablet, Take 1 tablet (40 mg total) by mouth daily as needed., Disp: 90 tablet, Rfl: 0 .  gabapentin (NEURONTIN) 100 MG capsule, Take 100 mg by mouth 2 (two) times daily., Disp: , Rfl:  .  glucosamine-chondroitin 500-400 MG tablet, Take 1 tablet by mouth 3 (three) times daily., Disp: , Rfl:  .  glucose blood (ACCU-CHEK AVIVA PLUS) test strip, Use to check blood sugar up to 4 times daily, Disp: 100 each, Rfl: 3 .  HYDROcodone-acetaminophen (NORCO) 5-325 MG tablet, Norco 5 mg-325 mg  tablet  Take 1 tablet twice a day by oral route as needed., Disp: , Rfl:  .  Insulin Glargine (BASAGLAR KWIKPEN) 100 UNIT/ML SOPN, INJECT 20 UNITS TOTAL      SUBCUTANEOUSLY DAILY, Disp: 15 mL, Rfl: 0 .  Insulin Pen Needle (BD PEN NEEDLE NANO U/F) 32G X 4 MM MISC, USE DAILY WITH VICTOZA AND BASAGLAR, Disp: 180 each, Rfl: 2 .  Lancets MISC, Use up to 4 times daily to check blood sugars., Disp: 200 each, Rfl: 11 .  losartan (COZAAR) 50 MG tablet, TAKE 1 TABLET DAILY, Disp: 90 tablet, Rfl: 1 .  Multiple Minerals-Vitamins (CALCIUM CITRATE PLUS PO), Take by mouth., Disp: , Rfl:  .  PARoxetine (PAXIL-CR) 25 MG 24 hr tablet, TAKE 1 TABLET DAILY, Disp: 90 tablet, Rfl: 3 .  potassium chloride (K-DUR) 10 MEQ tablet, potassium chloride ER 10 mEq tablet,extended release, Disp: , Rfl:  .  VICTOZA 18 MG/3ML SOPN, INJECT 0.3ML (1.8MG  TOTAL) SUBCUTANEOUSLY DAILY, Disp: 27 mL, Rfl: 3  EXAM: This is a telehealth telephone visit and thus no physical exam was completed.  ASSESSMENT AND PLAN:  Discussed the following assessment and plan:  DM (diabetes mellitus), type 2 with renal complications ([place]) Much improved with CBGs.  She will continue her current regimen.  Hypertension Some improved blood pressure since changing amlodipine to nighttime dosing.  We will see if our clinical pharmacist can follow-up with her in a week or so to see how her blood pressure is doing prior to increasing the dose of her amlodipine.  She will continue her losartan.  Bursitis of hip I suspect she has bursitis though there could be an intra-articular issue such as arthritis.  She will contact her orthopedist if this worsens.  Fall No recurrent falls.  History of vomiting Chronic history of vomiting.  She underwent evaluation with GI and had a negative EGD.  She notes it infrequently occurs now.  She will monitor and if it worsens she will let us know.   No orders of the defined types were placed in this encounter.   No  orders of the defined types were placed in this encounter.    I discussed the assessment and treatment plan with the patient. The patient was provided an opportunity to ask questions and all were answered. The patient agreed with the plan and demonstrated an understanding of the instructions.   The patient was advised to call back or seek an in-person evaluation if the symptoms worsen or if the condition fails to improve as anticipated.  I spent 15 minutes in nonface-to-face time during this encounter.  Tommi Rumps, MD

## 2019-07-03 NOTE — Assessment & Plan Note (Signed)
No recurrent falls.

## 2019-07-03 NOTE — Assessment & Plan Note (Signed)
Much improved with CBGs.  She will continue her current regimen.

## 2019-07-03 NOTE — Assessment & Plan Note (Signed)
I suspect she has bursitis though there could be an intra-articular issue such as arthritis.  She will contact her orthopedist if this worsens.

## 2019-07-11 ENCOUNTER — Ambulatory Visit: Payer: Medicare Other | Admitting: Pharmacist

## 2019-07-11 DIAGNOSIS — I10 Essential (primary) hypertension: Secondary | ICD-10-CM

## 2019-07-11 MED ORDER — AMLODIPINE BESYLATE 5 MG PO TABS: 5.0000 mg | ORAL_TABLET | Freq: Every day | ORAL | 0 refills | Status: DC

## 2019-07-11 NOTE — Patient Instructions (Signed)
Visit Information  Goals Addressed            This Visit's Progress     Patient Stated   . "I want to work on my diabetes" (pt-stated)       [place] (see longtitudinal plan of care for additional care plan information)  Current Barriers:  . Diabetes: uncontrolled at goal <7.5%; most recent A1c 7.6% o Spoke with Dr. Caryl Bis 2/24, had some high and some low readings. Requested a sooner f/u with me to review readings and adjust medications as needed.  . Current antihyperglycemic regimen: Victoza 1.8 mg daily, Basaglar 20 units daily . Cardiovascular risk reduction: o Current hypertensive regimen: amlodipine 2.5 mg QPM, losartan 50 mg QAM  SBP DBP HR   25-Feb 156 91 69 Afternoon  26-Feb 160 86 65 Morning pre med  27-Feb 155 84 68 Morning pre med  1-Mar 158 80 72 Bedtime  3-Mar 158 85 86   4-Mar 154 81 65 Morning pre med   157 85 71    o Current hyperlipidemia regimen: atorvastatin 80 mg daily; LDL now at goal <70  Pharmacist Clinical Goal(s):  Marland Kitchen Over the next 90 days, patient with work with PharmD and primary care provider to address optimized management of diabetes  Interventions: . Increase amlodipine to 5 mg QPM. She will take 2 of the 2.5 mg tablets she current has. Continue losartan 50 mg QAM.  . Continue checking BP daily and recording for review.   Patient Self Care Activities:  . Patient will check blood glucose daily, document, and provide at future appointments . Patient will check BP daily document, and provide at future appointments . Patient will take medications as prescribed . Patient will report any questions or concerns to provider   Please see past updates related to this goal by clicking on the "Past Updates" button in the selected goal         Patient verbalizes understanding of instructions provided today.    Plan:  - Will outreach for f/u in ~2 weeks as previously scheduled  [name], PharmD, [place], [place]  Pharmacist [place] [phone]

## 2019-07-11 NOTE — Chronic Care Management (AMB) (Signed)
Chronic Care Management   Follow Up Note   07/11/2019 Name: Kelly [name] MRN: [id number] DOB: Dec 28, 1940  Referred by: [name], MD Reason for referral : Chronic Care Management (Medication Management)   Kelly [name] is a 79 y.o. year old female who is a primary care patient of Caryl Bis, Angela Adam, MD. The CCM team was consulted for assistance with chronic disease management and care coordination needs.    Contacted patient for medication management review.  Review of patient status, including review of consultants reports, relevant laboratory and other test results, and collaboration with appropriate care team members and the patient's provider was performed as part of comprehensive patient evaluation and provision of chronic care management services.    SDOH (Social Determinants of Health) assessments performed: No See Care Plan activities for detailed interventions related to [facility])     Outpatient Encounter Medications as of 07/11/2019  Medication Sig Note  . amLODipine (NORVASC) 5 MG tablet Take 1 tablet (5 mg total) by mouth daily.   Marland Kitchen losartan (COZAAR) 50 MG tablet TAKE 1 TABLET DAILY   . [DISCONTINUED] amLODipine (NORVASC) 2.5 MG tablet TAKE 1 TABLET DAILY   . ALPRAZolam (XANAX) 0.5 MG tablet Take 0.5 tablets (0.25 mg total) by mouth 2 (two) times daily as needed for anxiety. (Patient taking differently: Take 0.5 mg by mouth 2 (two) times daily as needed for anxiety. ) 05/21/2018: Uses ~2x a month   . aspirin EC 81 MG tablet Take 81 mg by mouth daily.   Marland Kitchen atorvastatin (LIPITOR) 80 MG tablet Take 1 tablet (80 mg total) by mouth daily.   . calcitRIOL (ROCALTROL) 0.25 MCG capsule Take 0.25 mcg by mouth daily.   . cholecalciferol (VITAMIN D3) 25 MCG (1000 UNIT) tablet Take 1,000 Units by mouth daily.   . cyclobenzaprine (FLEXERIL) 10 MG tablet TAKE 1/2 TABLET (5MG  TOTAL)3 TIMES A DAY AS NEEDED    FOR MUSCLE SPASMS 01/31/2019: Using ~1/week for leg cramps  .  denosumab (PROLIA) 60 MG/ML SOSY injection Inject 60 mg into the skin every 6 (six) months. 09/24/2018: Last dose in April   . docusate sodium (COLACE) 100 MG capsule Take 100 mg by mouth 2 (two) times daily.   . furosemide (LASIX) 40 MG tablet Take 1 tablet (40 mg total) by mouth daily as needed. 11/06/2017: Takes very sporadically PRN lower extremity edema  . gabapentin (NEURONTIN) 100 MG capsule Take 100 mg by mouth 2 (two) times daily. 12/06/2018: 1 daily   . glucosamine-chondroitin 500-400 MG tablet Take 1 tablet by mouth 3 (three) times daily.   Marland Kitchen glucose blood (ACCU-CHEK AVIVA PLUS) test strip Use to check blood sugar up to 4 times daily   . HYDROcodone-acetaminophen (NORCO) 5-325 MG tablet Norco 5 mg-325 mg tablet  Take 1 tablet twice a day by oral route as needed. 01/03/2019: PRN, 0-1 daily  . Insulin Glargine (BASAGLAR KWIKPEN) 100 UNIT/ML SOPN INJECT 20 UNITS TOTAL      SUBCUTANEOUSLY DAILY   . Insulin Pen Needle (BD PEN NEEDLE NANO U/F) 32G X 4 MM MISC USE DAILY WITH VICTOZA AND BASAGLAR   . Lancets MISC Use up to 4 times daily to check blood sugars.   . Multiple Minerals-Vitamins (CALCIUM CITRATE PLUS PO) Take by mouth.   Marland Kitchen PARoxetine (PAXIL-CR) 25 MG 24 hr tablet TAKE 1 TABLET DAILY   . potassium chloride (K-DUR) 10 MEQ tablet potassium chloride ER 10 mEq tablet,extended release 11/06/2017: Only takes when she takes furosemide  . VICTOZA  18 MG/3ML SOPN INJECT 0.3ML (1.8MG  TOTAL) SUBCUTANEOUSLY DAILY    No facility-administered encounter medications on file as of 07/11/2019.     Objective:   Goals Addressed            This Visit's Progress     Patient Stated   . "I want to work on my diabetes" (pt-stated)       [place] (see longtitudinal plan of care for additional care plan information)  Current Barriers:  . Diabetes: uncontrolled at goal <7.5%; most recent A1c 7.6% o Spoke with Dr. Caryl Bis 2/24, had some high and some low readings. Requested a sooner f/u with me to  review readings and adjust medications as needed.  . Current antihyperglycemic regimen: Victoza 1.8 mg daily, Basaglar 20 units daily . Cardiovascular risk reduction: o Current hypertensive regimen: amlodipine 2.5 mg QPM, losartan 50 mg QAM  SBP DBP HR   25-Feb 156 91 69 Afternoon  26-Feb 160 86 65 Morning pre med  27-Feb 155 84 68 Morning pre med  1-Mar 158 80 72 Bedtime  3-Mar 158 85 86   4-Mar 154 81 65 Morning pre med   157 85 71    o Current hyperlipidemia regimen: atorvastatin 80 mg daily; LDL now at goal <70  Pharmacist Clinical Goal(s):  Marland Kitchen Over the next 90 days, patient with work with PharmD and primary care provider to address optimized management of diabetes  Interventions: . Increase amlodipine to 5 mg QPM. She will take 2 of the 2.5 mg tablets she current has. Continue losartan 50 mg QAM.  . Continue checking BP daily and recording for review.   Patient Self Care Activities:  . Patient will check blood glucose daily, document, and provide at future appointments . Patient will check BP daily document, and provide at future appointments . Patient will take medications as prescribed . Patient will report any questions or concerns to provider   Please see past updates related to this goal by clicking on the "Past Updates" button in the selected goal          Plan:  - Will outreach for f/u in ~2 weeks as previously scheduled  [name], PharmD, [place], [place] Pharmacist [place] [phone]

## 2019-07-22 DIAGNOSIS — R600 Localized edema: Secondary | ICD-10-CM | POA: Diagnosis not present

## 2019-07-22 DIAGNOSIS — I10 Essential (primary) hypertension: Secondary | ICD-10-CM | POA: Diagnosis not present

## 2019-07-22 DIAGNOSIS — N2581 Secondary hyperparathyroidism of renal origin: Secondary | ICD-10-CM | POA: Diagnosis not present

## 2019-07-22 DIAGNOSIS — E1122 Type 2 diabetes mellitus with diabetic chronic kidney disease: Secondary | ICD-10-CM | POA: Diagnosis not present

## 2019-07-22 DIAGNOSIS — N189 Chronic kidney disease, unspecified: Secondary | ICD-10-CM | POA: Diagnosis not present

## 2019-07-22 DIAGNOSIS — N184 Chronic kidney disease, stage 4 (severe): Secondary | ICD-10-CM | POA: Diagnosis not present

## 2019-07-23 ENCOUNTER — Other Ambulatory Visit: Payer: Self-pay | Admitting: Family Medicine

## 2019-07-23 DIAGNOSIS — N184 Chronic kidney disease, stage 4 (severe): Secondary | ICD-10-CM

## 2019-07-23 DIAGNOSIS — E1122 Type 2 diabetes mellitus with diabetic chronic kidney disease: Secondary | ICD-10-CM

## 2019-07-25 ENCOUNTER — Ambulatory Visit (INDEPENDENT_AMBULATORY_CARE_PROVIDER_SITE_OTHER): Payer: Medicare Other | Admitting: Pharmacist

## 2019-07-25 DIAGNOSIS — I10 Essential (primary) hypertension: Secondary | ICD-10-CM | POA: Diagnosis not present

## 2019-07-25 DIAGNOSIS — Z794 Long term (current) use of insulin: Secondary | ICD-10-CM | POA: Diagnosis not present

## 2019-07-25 DIAGNOSIS — N184 Chronic kidney disease, stage 4 (severe): Secondary | ICD-10-CM | POA: Diagnosis not present

## 2019-07-25 DIAGNOSIS — E1122 Type 2 diabetes mellitus with diabetic chronic kidney disease: Secondary | ICD-10-CM

## 2019-07-25 NOTE — Chronic Care Management (AMB) (Signed)
Chronic Care Management   Follow Up Note   07/25/2019 Name: Kelly [name] MRN: [id number] DOB: 11-18-1940  Referred by: [name], MD Reason for referral : Chronic Care Management (Medication Management)   Kelly [name] is a 79 y.o. year old female who is a primary care patient of Kelly [name], Angela Adam, MD. The CCM team was consulted for assistance with chronic disease management and care coordination needs.   Contacted patient for medication management review.    Review of patient status, including review of consultants reports, relevant laboratory and other test results, and collaboration with appropriate care team members and the patient's provider was performed as part of comprehensive patient evaluation and provision of chronic care management services.    SDOH (Social Determinants of Health) assessments performed: Yes See Care Plan activities for detailed interventions related to [facility])     Outpatient Encounter Medications as of 07/25/2019  Medication Sig Note  . ALPRAZolam (XANAX) 0.5 MG tablet Take 0.5 tablets (0.25 mg total) by mouth 2 (two) times daily as needed for anxiety. (Patient taking differently: Take 0.5 mg by mouth 2 (two) times daily as needed for anxiety. ) 05/21/2018: Uses ~2x a month   . amLODipine (NORVASC) 5 MG tablet Take 1 tablet (5 mg total) by mouth daily.   Marland Kitchen aspirin EC 81 MG tablet Take 81 mg by mouth daily.   Marland Kitchen atorvastatin (LIPITOR) 80 MG tablet Take 1 tablet (80 mg total) by mouth daily.   . calcitRIOL (ROCALTROL) 0.25 MCG capsule Take 0.25 mcg by mouth daily.   . Calcium Citrate-Vitamin D (CALCIUM CITRATE +D PO) Take 600 mg by mouth daily.   . Cholecalciferol (VITAMIN D) 50 MCG (2000 UT) CAPS Take 2,000 Units by mouth daily.    . cyclobenzaprine (FLEXERIL) 10 MG tablet TAKE 1/2 TABLET (5MG  TOTAL)3 TIMES A DAY AS NEEDED    FOR MUSCLE SPASMS 01/31/2019: Using ~1/week for leg cramps  . denosumab (PROLIA) 60 MG/ML SOSY injection Inject  60 mg into the skin every 6 (six) months. 09/24/2018: Last dose in April   . docusate sodium (COLACE) 100 MG capsule Take 100 mg by mouth 2 (two) times daily.   . furosemide (LASIX) 40 MG tablet Take 1 tablet (40 mg total) by mouth daily as needed. 11/06/2017: Takes very sporadically PRN lower extremity edema  . gabapentin (NEURONTIN) 100 MG capsule Take 100 mg by mouth 2 (two) times daily. 12/06/2018: 1 daily   . glucosamine-chondroitin 500-400 MG tablet Take 1 tablet by mouth 3 (three) times daily.   Marland Kitchen glucose blood (ACCU-CHEK AVIVA PLUS) test strip Use to check blood sugar up to 4 times daily   . HYDROcodone-acetaminophen (NORCO) 5-325 MG tablet Norco 5 mg-325 mg tablet  Take 1 tablet twice a day by oral route as needed. 01/03/2019: PRN, 0-1 daily  . Insulin Glargine (BASAGLAR KWIKPEN) 100 UNIT/ML INJECT 20 UNITS TOTAL      SUBCUTANEOUSLY DAILY   . Insulin Pen Needle (BD PEN NEEDLE NANO U/F) 32G X 4 MM MISC USE DAILY WITH VICTOZA AND BASAGLAR   . Lancets MISC Use up to 4 times daily to check blood sugars.   Marland Kitchen losartan (COZAAR) 50 MG tablet TAKE 1 TABLET DAILY   . Melatonin 5 MG CHEW Chew 5 mg by mouth daily.   Marland Kitchen PARoxetine (PAXIL-CR) 25 MG 24 hr tablet TAKE 1 TABLET DAILY   . potassium chloride (K-DUR) 10 MEQ tablet potassium chloride ER 10 mEq tablet,extended release 11/06/2017: Only takes when she  takes furosemide  . VICTOZA 18 MG/3ML SOPN INJECT 0.3ML (1.8MG  TOTAL) SUBCUTANEOUSLY DAILY   . [DISCONTINUED] Multiple Minerals-Vitamins (CALCIUM CITRATE PLUS PO) Take by mouth.    No facility-administered encounter medications on file as of 07/25/2019.     Objective:   Goals Addressed            This Visit's Progress     Patient Stated   . "I want to work on my diabetes" (pt-stated)       CARE PLAN ENTRY (see longtitudinal plan of care for additional care plan information)  Current Barriers:  . Diabetes: uncontrolled at goal <7.5%; most recent A1c 7.6% o Reports visit w/ nephrology  earlier this week went well.  . Current antihyperglycemic regimen: Victoza 1.8 mg daily, Basaglar 20 units daily . Current BG readings:   Fasting Pre supper Bedtime   4-Mar 107     5-Mar 103  100   6-Mar 89     7-Mar 140     8-Mar 111  205 Eaten cookies  9-Mar 133     10-Mar      11-Mar 148 66 238 Had low, tx oranges and cake  12-Mar 112  124   13-Mar 145 117    14-Mar 78  83   15-Mar 105  211   16-Mar 136     17-Mar 163     18-Mar 128     AVG 121 92 160    . Cardiovascular risk reduction: o Current hypertensive regimen: amlodipine 5 mg QPM, losartan 50 mg QAM- most recent BP at Dr. Keturah Barre office was 128  SBP DBP HR  4-Mar 154 81 65  5-Mar 144 82 70  6-Mar 135 76 76  7-Mar     8-Mar 143 76 76  9-Mar 133 75 73  10-Mar 138 71 73  11-Mar     12-Mar 123 72 73  13-Mar 159 89 64  14-Mar 144 77 75  15-Mar 159 87 71  16-Mar 156 76 76  17-Mar 139 71 75  18-Mar 149 89 68  AVG 144 79 72   o Current hyperlipidemia regimen: atorvastatin 80 mg daily; LDL now at goal <70  Pharmacist Clinical Goal(s):  Marland Kitchen Over the next 90 days, patient with work with PharmD and primary care provider to address optimized management of diabetes  Interventions: . Comprehensive medication management review, medication list updated in electronic medical record . Praised patient for regular monitoring and documentation of BG and BP  . Discussed appropriate treatment of hypoglycemic episode (did not need oranges AND cake to treat a low of 66). She verbalized understanding . Continue Basaglar 20 units daily and Victoza 1.8 mg daily. Due for A1c. Will communicate w/ Dr Kelly [name] to see if he would like her to go ahead and get this labwork, or defer until his next f/u in late May.  . Mismatch between manual BP checked at Dr. Keturah Barre office and patient home BP. Will collaborate w/ office staff to schedule patient for a nurse visit for BP check and comparison with her home meter. Patient aware she will need  to bring her home BP meter in for comparison. If A1c wanted, will collaborate to have lab visit and nurse visit scheduled at the same time.   Patient Self Care Activities:  . Patient will check blood glucose daily, document, and provide at future appointments . Patient will check BP daily document, and provide at future appointments . Patient will take medications as prescribed .  Patient will report any questions or concerns to provider   Please see past updates related to this goal by clicking on the "Past Updates" button in the selected goal          Plan:  - Scheduled f/u call 09/16/19  [name], PharmD, BCACP, [place] Pharmacist [place] [phone]

## 2019-07-25 NOTE — Patient Instructions (Signed)
Visit Information  Goals Addressed            This Visit's Progress     Patient Stated   . "I want to work on my diabetes" (pt-stated)       CARE PLAN ENTRY (see longtitudinal plan of care for additional care plan information)  Current Barriers:  . Diabetes: uncontrolled at goal <7.5%; most recent A1c 7.6% o Reports visit w/ nephrology earlier this week went well.  . Current antihyperglycemic regimen: Victoza 1.8 mg daily, Basaglar 20 units daily . Current BG readings:   Fasting Pre supper Bedtime   4-Mar 107     5-Mar 103  100   6-Mar 89     7-Mar 140     8-Mar 111  205 Eaten cookies  9-Mar 133     10-Mar      11-Mar 148 66 238 Had low, tx oranges and cake  12-Mar 112  124   13-Mar 145 117    14-Mar 78  83   15-Mar 105  211   16-Mar 136     17-Mar 163     18-Mar 128     AVG 121 92 160    . Cardiovascular risk reduction: o Current hypertensive regimen: amlodipine 5 mg QPM, losartan 50 mg QAM- most recent BP at Dr. Keturah Barre office was 128  SBP DBP HR  4-Mar 154 81 65  5-Mar 144 82 70  6-Mar 135 76 76  7-Mar     8-Mar 143 76 76  9-Mar 133 75 73  10-Mar 138 71 73  11-Mar     12-Mar 123 72 73  13-Mar 159 89 64  14-Mar 144 77 75  15-Mar 159 87 71  16-Mar 156 76 76  17-Mar 139 71 75  18-Mar 149 89 68  AVG 144 79 72   o Current hyperlipidemia regimen: atorvastatin 80 mg daily; LDL now at goal <70  Pharmacist Clinical Goal(s):  Marland Kitchen Over the next 90 days, patient with work with PharmD and primary care provider to address optimized management of diabetes  Interventions: . Comprehensive medication management review, medication list updated in electronic medical record . Praised patient for regular monitoring and documentation of BG and BP  . Discussed appropriate treatment of hypoglycemic episode (did not need oranges AND cake to treat a low of 66). She verbalized understanding . Continue Basaglar 20 units daily and Victoza 1.8 mg daily. Due for A1c. Will  communicate w/ Dr Caryl Bis to see if he would like her to go ahead and get this labwork, or defer until his next f/u in late May.  . Mismatch between manual BP checked at Dr. Keturah Barre office and patient home BP. Will collaborate w/ office staff to schedule patient for a nurse visit for BP check and comparison with her home meter. Patient aware she will need to bring her home BP meter in for comparison. If A1c wanted, will collaborate to have lab visit and nurse visit scheduled at the same time.   Patient Self Care Activities:  . Patient will check blood glucose daily, document, and provide at future appointments . Patient will check BP daily document, and provide at future appointments . Patient will take medications as prescribed . Patient will report any questions or concerns to provider   Please see past updates related to this goal by clicking on the "Past Updates" button in the selected goal         Patient verbalizes understanding of instructions provided  today.   Plan:  - Scheduled f/u call 09/16/19  [name], PharmD, BCACP, CPP Clinical Pharmacist [place] [phone]

## 2019-07-30 ENCOUNTER — Telehealth: Payer: Self-pay | Admitting: Family Medicine

## 2019-07-30 DIAGNOSIS — D225 Melanocytic nevi of trunk: Secondary | ICD-10-CM | POA: Diagnosis not present

## 2019-07-30 DIAGNOSIS — D2272 Melanocytic nevi of left lower limb, including hip: Secondary | ICD-10-CM | POA: Diagnosis not present

## 2019-07-30 DIAGNOSIS — L570 Actinic keratosis: Secondary | ICD-10-CM | POA: Diagnosis not present

## 2019-07-30 DIAGNOSIS — D2262 Melanocytic nevi of left upper limb, including shoulder: Secondary | ICD-10-CM | POA: Diagnosis not present

## 2019-07-30 DIAGNOSIS — D2271 Melanocytic nevi of right lower limb, including hip: Secondary | ICD-10-CM | POA: Diagnosis not present

## 2019-07-30 DIAGNOSIS — L821 Other seborrheic keratosis: Secondary | ICD-10-CM | POA: Diagnosis not present

## 2019-07-30 DIAGNOSIS — Z85828 Personal history of other malignant neoplasm of skin: Secondary | ICD-10-CM | POA: Diagnosis not present

## 2019-07-30 DIAGNOSIS — X32XXXA Exposure to sunlight, initial encounter: Secondary | ICD-10-CM | POA: Diagnosis not present

## 2019-07-30 NOTE — Telephone Encounter (Signed)
Pt needs to schedule fasting lab and bp visit. Please remind pt to bring at home bp monitor

## 2019-07-30 NOTE — Addendum Note (Signed)
Addended by: [name] on: 07/30/2019 08:40 AM   Modules accepted: Orders

## 2019-07-30 NOTE — Chronic Care Management (AMB) (Signed)
  Chronic Care Management   Note  07/30/2019 Name: [name] MRN: [id number] DOB: June 13, 1940  Kelly [name] is a 79 y.o. year old female who is a primary care patient of Caryl Bis, Angela Adam, MD. The CCM team was consulted for assistance with chronic disease management and care coordination needs.   A1c ordered. Will collaborate w/ staff to outreach patient to schedule non-fasting lab work and a nurse BP visit in the next ~2 weeks.    [name], PharmD, [place], CPP Clinical Pharmacist [place] [phone]

## 2019-08-07 ENCOUNTER — Other Ambulatory Visit (INDEPENDENT_AMBULATORY_CARE_PROVIDER_SITE_OTHER): Payer: Medicare Other

## 2019-08-07 ENCOUNTER — Ambulatory Visit (INDEPENDENT_AMBULATORY_CARE_PROVIDER_SITE_OTHER): Payer: Medicare Other

## 2019-08-07 ENCOUNTER — Other Ambulatory Visit: Payer: Self-pay

## 2019-08-07 ENCOUNTER — Other Ambulatory Visit: Payer: Self-pay | Admitting: Family Medicine

## 2019-08-07 VITALS — BP 136/67 | HR 78

## 2019-08-07 DIAGNOSIS — I10 Essential (primary) hypertension: Secondary | ICD-10-CM

## 2019-08-07 DIAGNOSIS — Z794 Long term (current) use of insulin: Secondary | ICD-10-CM

## 2019-08-07 DIAGNOSIS — N184 Chronic kidney disease, stage 4 (severe): Secondary | ICD-10-CM

## 2019-08-07 DIAGNOSIS — E1122 Type 2 diabetes mellitus with diabetic chronic kidney disease: Secondary | ICD-10-CM | POA: Diagnosis not present

## 2019-08-07 LAB — HEMOGLOBIN A1C: Hgb A1c MFr Bld: 7.6 % — ABNORMAL HIGH (ref 4.6–6.5)

## 2019-08-07 NOTE — Progress Notes (Signed)
BP is acceptable. She should continue to monitor at home.

## 2019-08-07 NOTE — Progress Notes (Signed)
Patient is here for a BP check due to bp being high at last visit, as per patient.  Currently patients BP is 136/67 and BPM is 78.  Patient has no complaints of headaches, blurry vision, chest pain, arm pain, light headedness, dizziness, and nor jaw pain. Please see previous note for order.

## 2019-08-07 NOTE — Addendum Note (Signed)
Addended by: Tor Netters I on: 08/07/2019 03:25 PM   Modules accepted: Orders

## 2019-08-08 LAB — BASIC METABOLIC PANEL
BUN: 30 mg/dL — ABNORMAL HIGH (ref 6–23)
CO2: 31 mEq/L (ref 19–32)
Calcium: 8.7 mg/dL (ref 8.4–10.5)
Chloride: 104 mEq/L (ref 96–112)
Creatinine, Ser: 1.98 mg/dL — ABNORMAL HIGH (ref 0.40–1.20)
GFR: 24.36 mL/min — ABNORMAL LOW (ref >60.00)
Glucose, Bld: 220 mg/dL — ABNORMAL HIGH (ref 70–99)
Potassium: 3.8 mEq/L (ref 3.5–5.1)
Sodium: 142 mEq/L (ref 135–145)

## 2019-08-26 ENCOUNTER — Telehealth: Payer: Self-pay | Admitting: Family Medicine

## 2019-08-26 ENCOUNTER — Ambulatory Visit (INDEPENDENT_AMBULATORY_CARE_PROVIDER_SITE_OTHER): Payer: Medicare Other | Admitting: Pharmacist

## 2019-08-26 DIAGNOSIS — N184 Chronic kidney disease, stage 4 (severe): Secondary | ICD-10-CM

## 2019-08-26 DIAGNOSIS — Z794 Long term (current) use of insulin: Secondary | ICD-10-CM

## 2019-08-26 DIAGNOSIS — I10 Essential (primary) hypertension: Secondary | ICD-10-CM | POA: Diagnosis not present

## 2019-08-26 DIAGNOSIS — E1122 Type 2 diabetes mellitus with diabetic chronic kidney disease: Secondary | ICD-10-CM

## 2019-08-26 NOTE — Telephone Encounter (Signed)
Pt's daughter called she would like to talk to Catie or Dr. Caryl Bis about her mothers Insulin Glargine (BASAGLAR KWIKPEN) 100 UNIT/ML and Victoza

## 2019-08-26 NOTE — Patient Instructions (Signed)
Visit Information  Goals Addressed            This Visit's Progress     Patient Stated   . "I want to work on my diabetes" (pt-stated)       [place] (see longtitudinal plan of care for additional care plan information)  Current Barriers:  . Diabetes: uncontrolled at goal <7.5%; most recent A1c 7.6% o Daughter calls today to discuss recent medication changes. She reports episodes of hypoglycemia (60-70s) since increasing to 22 units daily. Happening overnight. She worries that her mother doesn't always wake up when this happens o She wants to make sure that we saw the recent decline in renal function on nephrology labs . Current antihyperglycemic regimen: Victoza 1.8 mg daily, Basaglar 22 units daily . Cardiovascular risk reduction: o Current hypertensive regimen: amlodipine 5 mg QPM, losartan 50 mg QAM - Daughter reports that her mother sometimes doesn't take her morning medications until ~3-4 pm. o Current hyperlipidemia regimen: atorvastatin 80 mg daily; LDL now at goal <70 . CKD: calcitriol 0.25 mg daily. Patient's daughter worried that she read that this can cause hypotension   Pharmacist Clinical Goal(s):  Marland Kitchen Over the next 90 days, patient with work with PharmD and primary care provider to address optimized management of diabetes  Interventions: . Comprehensive medication review performed, medication list updated in electronic medical record . Inter-disciplinary care team collaboration (see longitudinal plan of care) . Decrease Basaglar to 20 units daily  . Extensive discussion w/ patient's daughter about administration timing. Reviewed that patient could give her daily medications at whatever time is best for her ability to remain adherent.  . Reviewed importance of continued discussion of dietary adjustments (including d/c sodas) . Reviewed that hypotension is not a common side effect of calcitriol. Reviewed why patient needs this medication for vitamin D d/t  CKD . Discussed that it is most appropriate for patient's daughter to attend appointments w/ her mom moving forward. She requests a face to face appointment w/ Dr. Caryl Bis at next appointment. I have also made my next appointment face to face  Patient Self Care Activities:  . Patient will check blood glucose daily, document, and provide at future appointments . Patient will check BP daily document, and provide at future appointments . Patient will take medications as prescribed . Patient will report any questions or concerns to provider   Please see past updates related to this goal by clicking on the "Past Updates" button in the selected goal         Patient verbalizes understanding of instructions provided today.   Plan:  - Will meet with patient face to face at next appointment  [name], PharmD, [place], [place] Pharmacist [place] [phone]

## 2019-08-26 NOTE — Telephone Encounter (Signed)
Pt's daughter called she would like to talk to Catie or Dr. Caryl Bis about her mothers Insulin Glargine (BASAGLAR KWIKPEN) 100 UNIT/ML and Victoza daughter said try her mobile if you can't get her call her landline at [phone].  Modell Fendrick,cma

## 2019-08-26 NOTE — Telephone Encounter (Signed)
If the patients daughter can't be reached on her mobile try her land line # [phone].

## 2019-08-26 NOTE — Chronic Care Management (AMB) (Signed)
Chronic Care Management   Follow Up Note   08/26/2019 Name: Kelly [name] MRN: [id number] DOB: 1940-10-08  Referred by: [name], MD Reason for referral : Chronic Care Management (Medication Management)   Kelly [name] is a 79 y.o. year old female who is a primary care patient of Caryl Bis, Angela Adam, MD. The CCM team was consulted for assistance with chronic disease management and care coordination needs.    Received call from patient's daughter wanting to discuss medication concerns.   Review of patient status, including review of consultants reports, relevant laboratory and other test results, and collaboration with appropriate care team members and the patient's provider was performed as part of comprehensive patient evaluation and provision of chronic care management services.    SDOH (Social Determinants of Health) assessments performed: No See Care Plan activities for detailed interventions related to [facility])     Outpatient Encounter Medications as of 08/26/2019  Medication Sig Note  . amLODipine (NORVASC) 5 MG tablet Take 1 tablet (5 mg total) by mouth daily.   . calcitRIOL (ROCALTROL) 0.25 MCG capsule Take 0.25 mcg by mouth daily.   . Insulin Glargine (BASAGLAR KWIKPEN) 100 UNIT/ML INJECT 20 UNITS TOTAL      SUBCUTANEOUSLY DAILY 08/26/2019: 22 units  . ALPRAZolam (XANAX) 0.5 MG tablet Take 0.5 tablets (0.25 mg total) by mouth 2 (two) times daily as needed for anxiety. (Patient taking differently: Take 0.5 mg by mouth 2 (two) times daily as needed for anxiety. ) 05/21/2018: Uses ~2x a month   . aspirin EC 81 MG tablet Take 81 mg by mouth daily.   Marland Kitchen atorvastatin (LIPITOR) 80 MG tablet Take 1 tablet (80 mg total) by mouth daily.   . Calcium Citrate-Vitamin D (CALCIUM CITRATE +D PO) Take 600 mg by mouth daily.   . Cholecalciferol (VITAMIN D) 50 MCG (2000 UT) CAPS Take 2,000 Units by mouth daily.    . cyclobenzaprine (FLEXERIL) 10 MG tablet TAKE 1/2 TABLET (5MG   TOTAL)3 TIMES A DAY AS NEEDED    FOR MUSCLE SPASMS 01/31/2019: Using ~1/week for leg cramps  . denosumab (PROLIA) 60 MG/ML SOSY injection Inject 60 mg into the skin every 6 (six) months. 09/24/2018: Last dose in April   . docusate sodium (COLACE) 100 MG capsule Take 100 mg by mouth 2 (two) times daily.   . furosemide (LASIX) 40 MG tablet Take 1 tablet (40 mg total) by mouth daily as needed. 11/06/2017: Takes very sporadically PRN lower extremity edema  . gabapentin (NEURONTIN) 100 MG capsule Take 100 mg by mouth 2 (two) times daily. 12/06/2018: 1 daily   . glucosamine-chondroitin 500-400 MG tablet Take 1 tablet by mouth 3 (three) times daily.   Marland Kitchen glucose blood (ACCU-CHEK AVIVA PLUS) test strip Use to check blood sugar up to 4 times daily   . HYDROcodone-acetaminophen (NORCO) 5-325 MG tablet Norco 5 mg-325 mg tablet  Take 1 tablet twice a day by oral route as needed. 01/03/2019: PRN, 0-1 daily  . Insulin Pen Needle (BD PEN NEEDLE NANO U/F) 32G X 4 MM MISC USE DAILY WITH VICTOZA AND BASAGLAR   . Lancets MISC Use up to 4 times daily to check blood sugars.   Marland Kitchen losartan (COZAAR) 50 MG tablet TAKE 1 TABLET DAILY   . Melatonin 5 MG CHEW Chew 5 mg by mouth daily.   Marland Kitchen PARoxetine (PAXIL-CR) 25 MG 24 hr tablet TAKE 1 TABLET DAILY   . potassium chloride (K-DUR) 10 MEQ tablet potassium chloride ER 10 mEq tablet,extended  release 11/06/2017: Only takes when she takes furosemide  . VICTOZA 18 MG/3ML SOPN INJECT 0.3ML (1.8MG  TOTAL) SUBCUTANEOUSLY DAILY    No facility-administered encounter medications on file as of 08/26/2019.     Objective:   Goals Addressed            This Visit's Progress     Patient Stated   . "I want to work on my diabetes" (pt-stated)       [place] (see longtitudinal plan of care for additional care plan information)  Current Barriers:  . Diabetes: uncontrolled at goal <7.5%; most recent A1c 7.6% o Daughter calls today to discuss recent medication changes. She reports  episodes of hypoglycemia (60-70s) since increasing to 22 units daily. Happening overnight. She worries that her mother doesn't always wake up when this happens o She wants to make sure that we saw the recent decline in renal function on nephrology labs . Current antihyperglycemic regimen: Victoza 1.8 mg daily, Basaglar 22 units daily . Cardiovascular risk reduction: o Current hypertensive regimen: amlodipine 5 mg QPM, losartan 50 mg QAM - Daughter reports that her mother sometimes doesn't take her morning medications until ~3-4 pm. o Current hyperlipidemia regimen: atorvastatin 80 mg daily; LDL now at goal <70 . CKD: calcitriol 0.25 mg daily. Patient's daughter worried that she read that this can cause hypotension   Pharmacist Clinical Goal(s):  Marland Kitchen Over the next 90 days, patient with work with PharmD and primary care provider to address optimized management of diabetes  Interventions: . Comprehensive medication review performed, medication list updated in electronic medical record . Inter-disciplinary care team collaboration (see longitudinal plan of care) . Decrease Basaglar to 20 units daily  . Extensive discussion w/ patient's daughter about administration timing. Reviewed that patient could give her daily medications at whatever time is best for her ability to remain adherent.  . Reviewed importance of continued discussion of dietary adjustments (including d/c sodas) . Reviewed that hypotension is not a common side effect of calcitriol. Reviewed why patient needs this medication for vitamin D d/t CKD . Discussed that it is most appropriate for patient's daughter to attend appointments w/ her mom moving forward. She requests a face to face appointment w/ Dr. Caryl Bis at next appointment. I have also made my next appointment face to face  Patient Self Care Activities:  . Patient will check blood glucose daily, document, and provide at future appointments . Patient will check BP daily  document, and provide at future appointments . Patient will take medications as prescribed . Patient will report any questions or concerns to provider   Please see past updates related to this goal by clicking on the "Past Updates" button in the selected goal          Plan:  - Will meet with patient face to face at next appointment  [name], PharmD, [place], [place] Pharmacist [place] [phone]

## 2019-08-29 NOTE — Telephone Encounter (Signed)
error 

## 2019-09-04 DIAGNOSIS — E1121 Type 2 diabetes mellitus with diabetic nephropathy: Secondary | ICD-10-CM | POA: Diagnosis not present

## 2019-09-04 DIAGNOSIS — N184 Chronic kidney disease, stage 4 (severe): Secondary | ICD-10-CM | POA: Diagnosis not present

## 2019-09-04 DIAGNOSIS — I10 Essential (primary) hypertension: Secondary | ICD-10-CM | POA: Diagnosis not present

## 2019-09-11 DIAGNOSIS — M818 Other osteoporosis without current pathological fracture: Secondary | ICD-10-CM | POA: Diagnosis not present

## 2019-09-12 IMAGING — MG STEREOTACTIC VACUUM ASSIST RIGHT
6 series · 6 of 6 positions shown · non-contrast
Comparison: Previous exams.

Addendum:
CLINICAL DATA: Right upper breast distortion.

EXAM:
RIGHT BREAST STEREOTACTIC CORE NEEDLE BIOPSY

[R (1 of 6)]
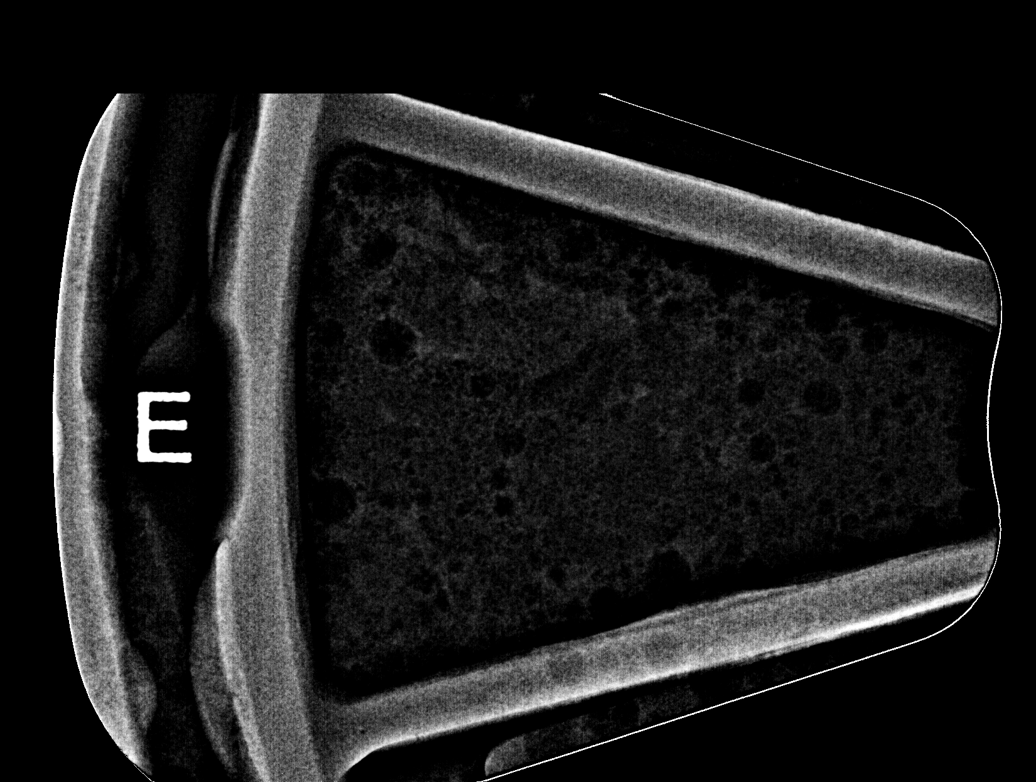

[R (2 of 6)]
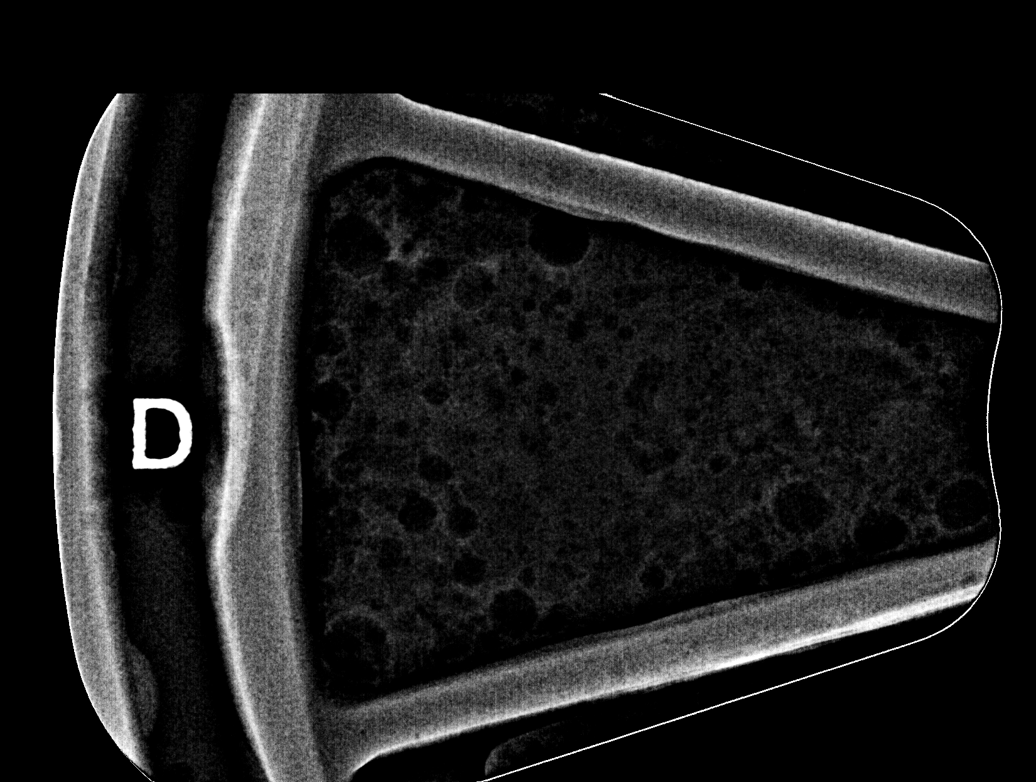

[R (3 of 6)]
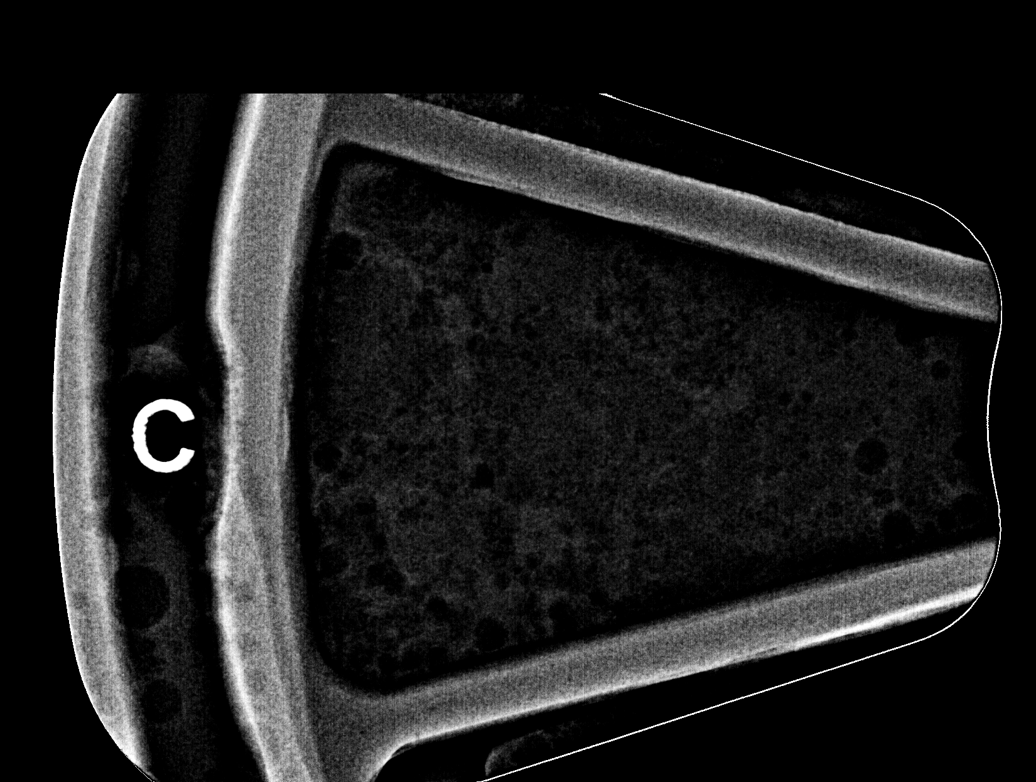

[R (4 of 6)]
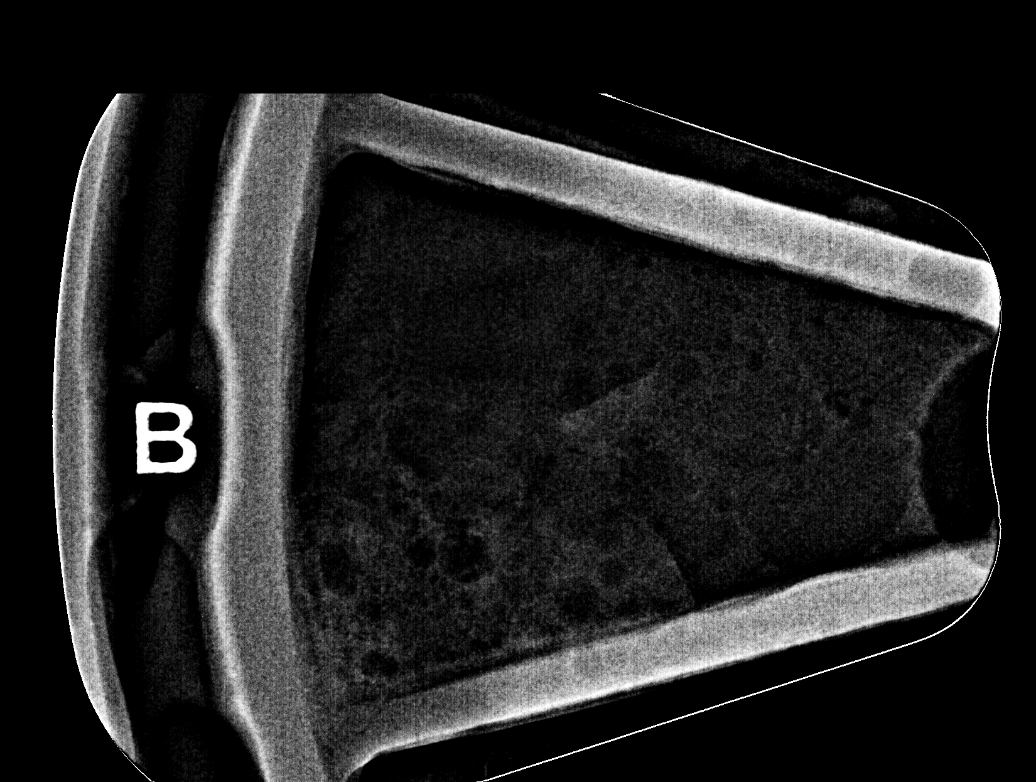

[R (5 of 6)]
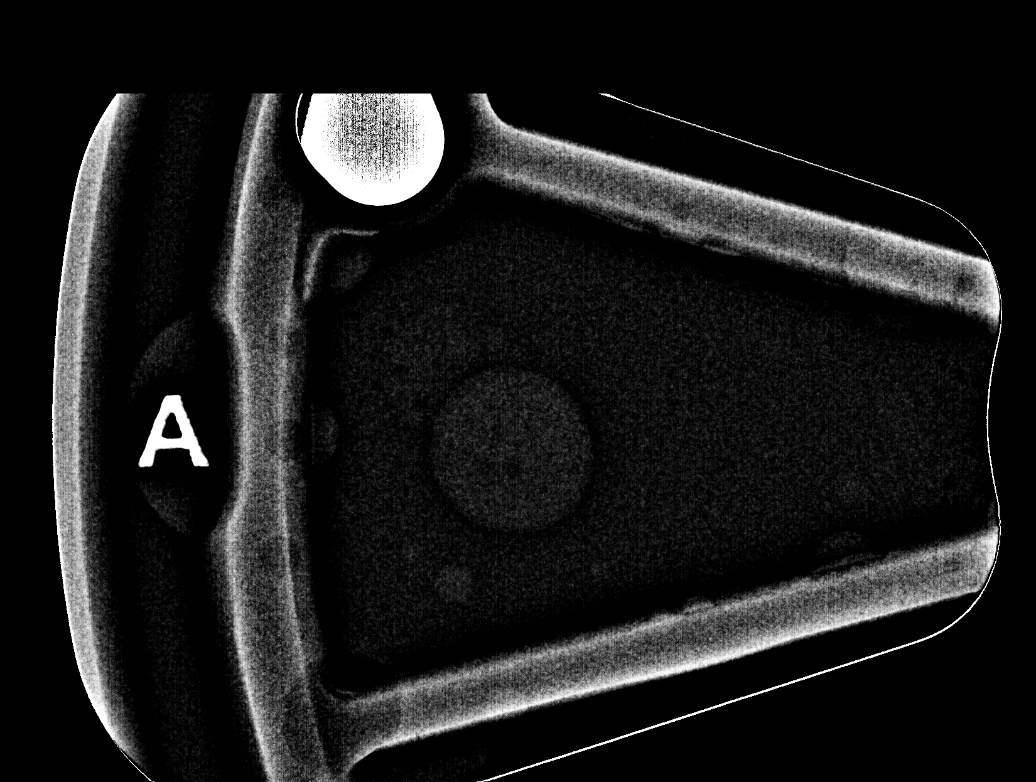

[R (6 of 6)]
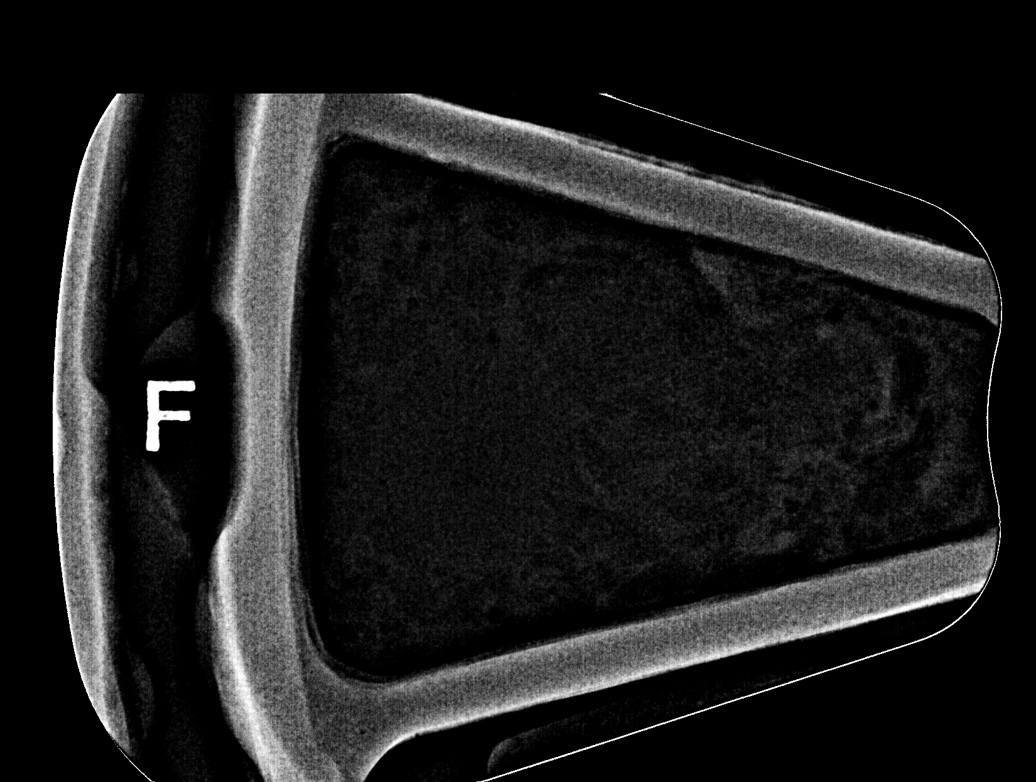

[6 of 6 positions shown; findings below may reference images not displayed]



Using sterile technique and 1% Lidocaine as local anesthetic, under
stereotactic guidance, a 9 gauge vacuum assisted device was used to
perform core needle biopsy of distortion in the upper right breast.
Using a lateral approach. Specimen radiograph was performed showing
presence of tissue.

Lesion quadrant: Upper inner quadrant

At the conclusion of the procedure, a X shaped tissue marker clip
was deployed into the biopsy cavity. Follow-up 2-view mammogram was
performed and dictated separately.
IMPRESSION: Stereotactic-guided biopsy of the right breast. No apparent
complications.

ADDENDUM:
Pathology of the right breast biopsy revealed A. BREAST, RIGHT,
UPPER INNER QUADRANT, STEREOTACTIC BIOPSY: COLUMNAR CELL CHANGE WITH
MICROCALCIFICATIONS. FIBROCYSTIC CHANGES WITH MICROCALCIFICATIONS.
NO ATYPICAL DUCTAL HYPERPLASIA OR MALIGNANCY IDENTIFIED.

This was found to be concordant by Dr. Millan Ferrer.

Recommendation: Routine bilateral screening mammogram in one year.

At the patient's request, results and recommendations were relayed
to the patient by phone on 06/20/2018. The patient stated she did
well following the biopsy with no bleeding, bruising or pain. Post
biopsy instructions were reviewed with the patient and all of her
questions were answered. She was encouraged to contact the [HOSPITAL]

Addendum by Chavigny, Baele on 06/20/2018.

*** End of Addendum ***

## 2019-09-16 ENCOUNTER — Ambulatory Visit (INDEPENDENT_AMBULATORY_CARE_PROVIDER_SITE_OTHER): Payer: Medicare Other | Admitting: Pharmacist

## 2019-09-16 DIAGNOSIS — E1122 Type 2 diabetes mellitus with diabetic chronic kidney disease: Secondary | ICD-10-CM | POA: Diagnosis not present

## 2019-09-16 DIAGNOSIS — E785 Hyperlipidemia, unspecified: Secondary | ICD-10-CM

## 2019-09-16 DIAGNOSIS — Z794 Long term (current) use of insulin: Secondary | ICD-10-CM

## 2019-09-16 DIAGNOSIS — I10 Essential (primary) hypertension: Secondary | ICD-10-CM

## 2019-09-16 DIAGNOSIS — N184 Chronic kidney disease, stage 4 (severe): Secondary | ICD-10-CM

## 2019-09-16 NOTE — Patient Instructions (Signed)
Visit Information  Goals Addressed            This Visit's Progress     Patient Stated   . "I want to work on my diabetes" (pt-stated)       Kelly [name] (see longtitudinal plan of care for additional care plan information)  Current Barriers:  . Diabetes: uncontrolled at goal <7.5%; most recent A1c 7.6% . Current antihyperglycemic regimen: Victoza 1.8 mg daily, Basaglar 20 units daily . Current glucose readings:  Fasting Before Supper After Supper  26-Apr 127  161  27-Apr 138  209  28-Apr 81 174 125  29-Apr 119    30-Apr 88    1-May 127 74 170  2-May 120  137  3-May 89    4-May 98 122   5-May 102    6-May 108 119   7-May 99 115   8-May 145 135 149  9-May 99  85  10-May 83    AVG 108 123 148   . Cardiovascular risk reduction: o Current hypertensive regimen: amlodipine 5 mg QPM, losartan 50 mg QAM  SBP DBP  26-Apr 134 80  27-Apr 135 77  28-Apr 133 71  29-Apr    30-Apr 134 80  1-May 135 76  2-May 134 77  3-May    4-May 134 80  5-May 134 72  6-May 133 72  7-May 136 78  8-May 135 70  9-May 140 75  10-May 135 77  AVG 135 76   o Current hyperlipidemia regimen: atorvastatin 80 mg daily; LDL now at goal <70 . CKD: calcitriol 0.25 mg daily.   Pharmacist Clinical Goal(s):  Marland Kitchen Over the next 90 days, patient with work with PharmD and primary care provider to address optimized management of diabetes  Interventions: . Comprehensive medication review performed, medication list updated in electronic medical record . Inter-disciplinary care team collaboration (see longitudinal plan of care) . Continue Basaglar 20 units daily and Victoza 1.8 mg daily. Avoiding sulfonylurea or mealtime insulin d/t risk of hypoglycemia, and patient's daughter's request to avoid anything with a significant risk of hypoglycemia. Appears that sugar readings are well controlled at this time. Patient aware of meals that drive up her sugars more than others.  . Reviewed BP readings. Patient  notes these are at different times during the day. Continue current regimen.  . Reviewed lipid readings. LDL at goal.   Patient Self Care Activities:  . Patient will check blood glucose daily, document, and provide at future appointments . Patient will check BP daily document, and provide at future appointments . Patient will take medications as prescribed . Patient will report any questions or concerns to provider   Please see past updates related to this goal by clicking on the "Past Updates" button in the selected goal         Patient verbalizes understanding of instructions provided today.   Plan:  - Scheduled f/u call in ~ 10 weeks (patient has PCP f/u in ~ 3 weeks)  [name], PharmD, [place], [place] Pharmacist [place] [phone]

## 2019-09-16 NOTE — Chronic Care Management (AMB) (Signed)
Chronic Care Management   Follow Up Note   09/16/2019 Name: Kelly [name] MRN: [id number] DOB: 12/08/1940  Referred by: Kelly Haven, MD Reason for referral : Chronic Care Management (Medication Management)   Kelly [name] is a 79 y.o. year old female who is a primary care patient of Kelly [name], Kelly Adam, MD. The CCM team was consulted for assistance with chronic disease management and care coordination needs.    Contacted patient for medication management review.   Review of patient status, including review of consultants reports, relevant laboratory and other test results, and collaboration with appropriate care team members and the patient's provider was performed as part of comprehensive patient evaluation and provision of chronic care management services.    SDOH (Social Determinants of Health) assessments performed: No See Care Plan activities for detailed interventions related to [facility])     Outpatient Encounter Medications as of 09/16/2019  Medication Sig Note  . amLODipine (NORVASC) 5 MG tablet Take 1 tablet (5 mg total) by mouth daily.   Marland Kitchen aspirin EC 81 MG tablet Take 81 mg by mouth daily.   Marland Kitchen atorvastatin (LIPITOR) 80 MG tablet Take 1 tablet (80 mg total) by mouth daily.   . calcitRIOL (ROCALTROL) 0.25 MCG capsule Take 0.25 mcg by mouth daily.   . Calcium Citrate-Vitamin D (CALCIUM CITRATE +D PO) Take 600 mg by mouth daily.   . Cholecalciferol (VITAMIN D) 50 MCG (2000 UT) CAPS Take 2,000 Units by mouth daily.    . furosemide (LASIX) 40 MG tablet Take 1 tablet (40 mg total) by mouth daily as needed. 11/06/2017: Takes very sporadically PRN lower extremity edema  . Insulin Glargine (BASAGLAR KWIKPEN) 100 UNIT/ML INJECT 20 UNITS TOTAL      SUBCUTANEOUSLY DAILY   . Insulin Pen Needle (BD PEN NEEDLE NANO U/F) 32G X 4 MM MISC USE DAILY WITH VICTOZA AND BASAGLAR   . losartan (COZAAR) 50 MG tablet TAKE 1 TABLET DAILY   . Melatonin 5 MG CHEW Chew 5 mg by mouth daily.    Marland Kitchen PARoxetine (PAXIL-CR) 25 MG 24 hr tablet TAKE 1 TABLET DAILY   . VICTOZA 18 MG/3ML SOPN INJECT 0.3ML (1.8MG  TOTAL) SUBCUTANEOUSLY DAILY   . ALPRAZolam (XANAX) 0.5 MG tablet Take 0.5 tablets (0.25 mg total) by mouth 2 (two) times daily as needed for anxiety. (Patient taking differently: Take 0.5 mg by mouth 2 (two) times daily as needed for anxiety. ) 05/21/2018: Uses ~2x a month   . cyclobenzaprine (FLEXERIL) 10 MG tablet TAKE 1/2 TABLET (5MG  TOTAL)3 TIMES A DAY AS NEEDED    FOR MUSCLE SPASMS (Patient not taking: Reported on 09/16/2019) 01/31/2019: Using ~1/week for leg cramps  . denosumab (PROLIA) 60 MG/ML SOSY injection Inject 60 mg into the skin every 6 (six) months. 09/24/2018: Last dose in April   . docusate sodium (COLACE) 100 MG capsule Take 100 mg by mouth 2 (two) times daily.   Marland Kitchen gabapentin (NEURONTIN) 100 MG capsule Take 100 mg by mouth 2 (two) times daily. 12/06/2018: 1 daily   . glucosamine-chondroitin 500-400 MG tablet Take 1 tablet by mouth 3 (three) times daily.   Marland Kitchen glucose blood (ACCU-CHEK AVIVA PLUS) test strip Use to check blood sugar up to 4 times daily   . HYDROcodone-acetaminophen (NORCO) 5-325 MG tablet Norco 5 mg-325 mg tablet  Take 1 tablet twice a day by oral route as needed. 01/03/2019: PRN, 0-1 daily  . Lancets MISC Use up to 4 times daily to check blood sugars.   Marland Kitchen  potassium chloride (K-DUR) 10 MEQ tablet potassium chloride ER 10 mEq tablet,extended release 11/06/2017: Only takes when she takes furosemide   No facility-administered encounter medications on file as of 09/16/2019.     Objective:   Goals Addressed            This Visit's Progress     Patient Stated   . "I want to work on my diabetes" (pt-stated)       [place] (see longtitudinal plan of care for additional care plan information)  Current Barriers:  . Diabetes: uncontrolled at goal <7.5%; most recent A1c 7.6% . Current antihyperglycemic regimen: Victoza 1.8 mg daily, Basaglar 20 units  daily . Current glucose readings:  Fasting Before Supper After Supper  26-Apr 127  161  27-Apr 138  209  28-Apr 81 174 125  29-Apr 119    30-Apr 88    1-May 127 74 170  2-May 120  137  3-May 89    4-May 98 122   5-May 102    6-May 108 119   7-May 99 115   8-May 145 135 149  9-May 99  85  10-May 83    AVG 108 123 148   . Cardiovascular risk reduction: o Current hypertensive regimen: amlodipine 5 mg QPM, losartan 50 mg QAM  SBP DBP  26-Apr 134 80  27-Apr 135 77  28-Apr 133 71  29-Apr    30-Apr 134 80  1-May 135 76  2-May 134 77  3-May    4-May 134 80  5-May 134 72  6-May 133 72  7-May 136 78  8-May 135 70  9-May 140 75  10-May 135 77  AVG 135 76   o Current hyperlipidemia regimen: atorvastatin 80 mg daily; LDL now at goal <70 . CKD: calcitriol 0.25 mg daily.   Pharmacist Clinical Goal(s):  Marland Kitchen Over the next 90 days, patient with work with PharmD and primary care provider to address optimized management of diabetes  Interventions: . Comprehensive medication review performed, medication list updated in electronic medical record . Inter-disciplinary care team collaboration (see longitudinal plan of care) . Continue Basaglar 20 units daily and Victoza 1.8 mg daily. Avoiding sulfonylurea or mealtime insulin d/t risk of hypoglycemia, and patient's daughter's request to avoid anything with a significant risk of hypoglycemia. Appears that sugar readings are well controlled at this time. Patient aware of meals that drive up her sugars more than others.  . Reviewed BP readings. Patient notes these are at different times during the day. Continue current regimen.  . Reviewed lipid readings. LDL at goal.   Patient Self Care Activities:  . Patient will check blood glucose daily, document, and provide at future appointments . Patient will check BP daily document, and provide at future appointments . Patient will take medications as prescribed . Patient will report any questions  or concerns to provider   Please see past updates related to this goal by clicking on the "Past Updates" button in the selected goal          Plan:  - Scheduled f/u call in ~ 10 weeks (patient has PCP f/u in ~ 3 weeks)  [name], PharmD, [place], [place] Pharmacist [place] [phone]

## 2019-09-17 ENCOUNTER — Other Ambulatory Visit: Payer: Self-pay | Admitting: Family Medicine

## 2019-09-17 DIAGNOSIS — E785 Hyperlipidemia, unspecified: Secondary | ICD-10-CM

## 2019-09-19 ENCOUNTER — Other Ambulatory Visit: Payer: Self-pay | Admitting: Family Medicine

## 2019-09-19 DIAGNOSIS — E1122 Type 2 diabetes mellitus with diabetic chronic kidney disease: Secondary | ICD-10-CM

## 2019-09-19 DIAGNOSIS — Z794 Long term (current) use of insulin: Secondary | ICD-10-CM

## 2019-09-19 DIAGNOSIS — N184 Chronic kidney disease, stage 4 (severe): Secondary | ICD-10-CM

## 2019-09-27 NOTE — Progress Notes (Signed)
I called and spoke with the patient and informed her that her appointment with the provider is a in person visit and she understood.  Leovanni Bjorkman,cma

## 2019-10-01 ENCOUNTER — Ambulatory Visit (INDEPENDENT_AMBULATORY_CARE_PROVIDER_SITE_OTHER): Payer: Medicare Other | Admitting: Family Medicine

## 2019-10-01 ENCOUNTER — Encounter: Payer: Self-pay | Admitting: Family Medicine

## 2019-10-01 ENCOUNTER — Other Ambulatory Visit: Payer: Self-pay

## 2019-10-01 VITALS — BP 120/70 | HR 98 | Temp 96.4°F | Ht 64.0 in | Wt 136.8 lb

## 2019-10-01 DIAGNOSIS — W57XXXA Bitten or stung by nonvenomous insect and other nonvenomous arthropods, initial encounter: Secondary | ICD-10-CM | POA: Insufficient documentation

## 2019-10-01 DIAGNOSIS — L84 Corns and callosities: Secondary | ICD-10-CM

## 2019-10-01 DIAGNOSIS — S70262A Insect bite (nonvenomous), left hip, initial encounter: Secondary | ICD-10-CM

## 2019-10-01 DIAGNOSIS — M7062 Trochanteric bursitis, left hip: Secondary | ICD-10-CM | POA: Diagnosis not present

## 2019-10-01 DIAGNOSIS — I10 Essential (primary) hypertension: Secondary | ICD-10-CM

## 2019-10-01 DIAGNOSIS — R1110 Vomiting, unspecified: Secondary | ICD-10-CM | POA: Insufficient documentation

## 2019-10-01 DIAGNOSIS — H04123 Dry eye syndrome of bilateral lacrimal glands: Secondary | ICD-10-CM | POA: Insufficient documentation

## 2019-10-01 DIAGNOSIS — Z794 Long term (current) use of insulin: Secondary | ICD-10-CM | POA: Diagnosis not present

## 2019-10-01 DIAGNOSIS — E1122 Type 2 diabetes mellitus with diabetic chronic kidney disease: Secondary | ICD-10-CM | POA: Diagnosis not present

## 2019-10-01 DIAGNOSIS — M7061 Trochanteric bursitis, right hip: Secondary | ICD-10-CM

## 2019-10-01 DIAGNOSIS — N184 Chronic kidney disease, stage 4 (severe): Secondary | ICD-10-CM | POA: Diagnosis not present

## 2019-10-01 DIAGNOSIS — M810 Age-related osteoporosis without current pathological fracture: Secondary | ICD-10-CM

## 2019-10-01 MED ORDER — DOXYCYCLINE HYCLATE 100 MG PO TABS: 100.0000 mg | ORAL_TABLET | Freq: Two times a day (BID) | ORAL | 0 refills | Status: DC

## 2019-10-01 NOTE — Patient Instructions (Signed)
Nice to see you. We are treating you with doxycycline for possible infection related to your tick bite. If you develop worsening redness or you develop fevers please contact us immediately. We will get density scan.  Please call to schedule this. I have referred you to podiatry.

## 2019-10-01 NOTE — Assessment & Plan Note (Signed)
Stable.  Continue current medication

## 2019-10-01 NOTE — Assessment & Plan Note (Signed)
She will see her orthopedist as planned.

## 2019-10-01 NOTE — Assessment & Plan Note (Signed)
She was evaluated by ophthalmology.  Treated with a steroid ointment.  She is no longer using this.  I discussed that she should only use the steroid ointment when advised by a physician and to not use it for very long as there is risk to her eyes with chronic use of steroids.

## 2019-10-01 NOTE — Assessment & Plan Note (Signed)
Bone density scan ordered.  She will continue Prolia.

## 2019-10-01 NOTE — Assessment & Plan Note (Signed)
Findings are concerning for surrounding cellulitis.  We will treat with doxycycline.  She will monitor the area and if the redness is not progressively improving she will let us know.  If she has any worsening redness or develops fever or signs of infection she will contact us immediately or be evaluated.

## 2019-10-01 NOTE — Progress Notes (Signed)
Kelly Rumps, MD Phone: [phone]  Theta Kelly [name] is a 79 y.o. female who presents today for f/u.  DIABETES Disease Monitoring: Recent A1c 7.6.        Polyuria/phagia/dipsia- no      Optho- UTD Medications: Compliance- taking basaglar and victoza Hypoglycemic symptoms- rare if she does not eat  HYPERTENSION  Disease Monitoring  Home BP Monitoring not checking consistently Chest pain- no    Dyspnea- no Medications  Compliance-  Taking losartan.  Tick bite: Patient notes she removed a tick from her left hip yesterday.  It was not engorged.  She is not had any fevers.  There has been some erythema surrounding it.  Dry eyes: She saw ophthalmology.  They gave her an ointment with a steroid in it to use for a brief period of time.  She used it for 6 doses and her symptoms improved.  She is no longer on this.  Vomiting: She had a day of vomiting last week for about 12 hours.  She got to the point where she was vomiting up yellow mucus.  Her abdomen was sore with this though did not have any abdominal pain.  No blood in her stool.  No blood in her vomitus.  No diarrhea.  Has not had any symptoms since then.  She notes she has had issues with this in the past where she will randomly vomit for half a day and then it will resolve.  Bilateral hip pain: Reports bursitis in bilateral hips.  Sees orthopedics for this.  Osteoporosis: Still getting Prolia.  Due for DEXA scan.    Social History   Tobacco Use  Smoking Status Never Smoker  Smokeless Tobacco Never Used     ROS see history of present illness  Objective  Physical Exam Vitals:   10/01/19 1048  BP: 120/70  Pulse: 98  Temp: (!) 96.4 F (35.8 C)  SpO2: 94%    BP Readings from Last 3 Encounters:  10/01/19 120/70  08/07/19 136/67  04/11/19 (!) 153/78   Wt Readings from Last 3 Encounters:  10/01/19 136 lb 12.8 oz (62.1 kg)  07/03/19 138 lb (62.6 kg)  04/15/19 145 lb (65.8 kg)    Physical  Exam Constitutional:      General: She is not in acute distress.    Appearance: She is not diaphoretic.  Cardiovascular:     Rate and Rhythm: Normal rate and regular rhythm.     Heart sounds: Normal heart sounds.  Pulmonary:     Effort: Pulmonary effort is normal.     Breath sounds: Normal breath sounds.  Musculoskeletal:     Right lower leg: No edema.     Left lower leg: No edema.  Skin:    General: Skin is warm and dry.       Neurological:     Mental Status: She is alert.    Diabetic Foot Exam - Simple   Simple Foot Form Diabetic Foot exam was performed with the following findings: Yes 10/01/2019 10:45 AM  Visual Inspection See comments: Yes Sensation Testing Intact to touch and monofilament testing bilaterally: Yes Pulse Check Posterior Tibialis and Dorsalis pulse intact bilaterally: Yes Comments Callus over the left first MTP joint, no other deformities, ulcerations, or skin breakdown      Assessment/Plan: Please see individual problem list.  Hypertension Well-controlled here.  Continue current medication.  DM (diabetes mellitus), type 2 with renal complications (HCC) Stable.  Continue current medication.  Dry eyes She was evaluated by  ophthalmology.  Treated with a steroid ointment.  She is no longer using this.  I discussed that she should only use the steroid ointment when advised by a physician and to not use it for very long as there is risk to her eyes with chronic use of steroids.  Tick bite of left hip Findings are concerning for surrounding cellulitis.  We will treat with doxycycline.  She will monitor the area and if the redness is not progressively improving she will let us know.  If she has any worsening redness or develops fever or signs of infection she will contact us immediately or be evaluated.  Vomiting Undetermined cause.  Has resolved.  She will monitor for recurrence.  Osteoporosis Bone density scan ordered.  She will continue  Prolia.  Bursitis of hip She will see her orthopedist as planned.  Referred to podiatry to consider diabetic shoes.  Orders Placed This Encounter  Procedures  . DG Bone Density    Standing Status:   Future    Standing Expiration Date:   09/30/2020    Order Specific Question:   Reason for Exam (SYMPTOM  OR DIAGNOSIS REQUIRED)    Answer:   osteoporosis    Order Specific Question:   Preferred imaging location?    Answer:   [facility]  . Ambulatory referral to Podiatry    Referral Priority:   Routine    Referral Type:   Consultation    Referral Reason:   Specialty Services Required    Requested Specialty:   Podiatry    Number of Visits Requested:   1    Meds ordered this encounter  Medications  . doxycycline (VIBRA-TABS) 100 MG tablet    Sig: Take 1 tablet (100 mg total) by mouth 2 (two) times daily.    Dispense:  14 tablet    Refill:  0    This visit occurred during the SARS-CoV-2 public health emergency.  Safety protocols were in place, including screening questions prior to the visit, additional usage of staff PPE, and extensive cleaning of exam room while observing appropriate contact time as indicated for disinfecting solutions.    Kelly Rumps, MD [place]

## 2019-10-01 NOTE — Assessment & Plan Note (Signed)
Undetermined cause.  Has resolved.  She will monitor for recurrence.

## 2019-10-01 NOTE — Assessment & Plan Note (Signed)
Well-controlled here.  Continue current medication.

## 2019-10-05 ENCOUNTER — Other Ambulatory Visit: Payer: Self-pay | Admitting: Family Medicine

## 2019-10-11 ENCOUNTER — Telehealth: Payer: Self-pay | Admitting: Family Medicine

## 2019-10-11 NOTE — Telephone Encounter (Signed)
Pt wants to know if she needs to call to make bone density test appt or if we need to send referral. Please advise

## 2019-10-11 NOTE — Telephone Encounter (Signed)
Pt wants to know if she needs to call to make bone density test appt or if we need to send referral. Please advise. Lesia Hausen

## 2019-10-14 ENCOUNTER — Other Ambulatory Visit: Payer: Self-pay | Admitting: Family Medicine

## 2019-10-14 DIAGNOSIS — Z1231 Encounter for screening mammogram for malignant neoplasm of breast: Secondary | ICD-10-CM

## 2019-10-14 NOTE — Telephone Encounter (Signed)
She needs to call to schedule the bone density test.

## 2019-10-16 DIAGNOSIS — T2101XA Burn of unspecified degree of chest wall, initial encounter: Secondary | ICD-10-CM | POA: Diagnosis not present

## 2019-10-16 DIAGNOSIS — M25551 Pain in right hip: Secondary | ICD-10-CM | POA: Diagnosis not present

## 2019-10-16 DIAGNOSIS — M6281 Muscle weakness (generalized): Secondary | ICD-10-CM | POA: Diagnosis not present

## 2019-10-16 DIAGNOSIS — M25552 Pain in left hip: Secondary | ICD-10-CM | POA: Diagnosis not present

## 2019-10-16 DIAGNOSIS — E1165 Type 2 diabetes mellitus with hyperglycemia: Secondary | ICD-10-CM | POA: Diagnosis not present

## 2019-10-16 DIAGNOSIS — M1612 Unilateral primary osteoarthritis, left hip: Secondary | ICD-10-CM | POA: Diagnosis not present

## 2019-10-16 DIAGNOSIS — M542 Cervicalgia: Secondary | ICD-10-CM | POA: Diagnosis not present

## 2019-10-16 DIAGNOSIS — M545 Low back pain: Secondary | ICD-10-CM | POA: Diagnosis not present

## 2019-10-16 DIAGNOSIS — M25559 Pain in unspecified hip: Secondary | ICD-10-CM | POA: Diagnosis not present

## 2019-10-16 DIAGNOSIS — M5136 Other intervertebral disc degeneration, lumbar region: Secondary | ICD-10-CM | POA: Diagnosis not present

## 2019-10-16 DIAGNOSIS — M25519 Pain in unspecified shoulder: Secondary | ICD-10-CM | POA: Diagnosis not present

## 2019-10-16 DIAGNOSIS — Z79891 Long term (current) use of opiate analgesic: Secondary | ICD-10-CM | POA: Diagnosis not present

## 2019-10-16 DIAGNOSIS — M5032 Other cervical disc degeneration, mid-cervical region, unspecified level: Secondary | ICD-10-CM | POA: Diagnosis not present

## 2019-10-16 DIAGNOSIS — Q762 Congenital spondylolisthesis: Secondary | ICD-10-CM | POA: Diagnosis not present

## 2019-10-17 ENCOUNTER — Ambulatory Visit
Admission: RE | Admit: 2019-10-17 | Discharge: 2019-10-17 | Disposition: A | Discharge status: Home / Self Care | Payer: Medicare Other | Source: Ambulatory Visit | Attending: Family Medicine | Admitting: Family Medicine

## 2019-10-17 ENCOUNTER — Other Ambulatory Visit: Payer: Medicare Other

## 2019-10-17 DIAGNOSIS — M810 Age-related osteoporosis without current pathological fracture: Secondary | ICD-10-CM | POA: Diagnosis not present

## 2019-10-17 DIAGNOSIS — Z1231 Encounter for screening mammogram for malignant neoplasm of breast: Secondary | ICD-10-CM | POA: Diagnosis not present

## 2019-10-17 DIAGNOSIS — Z780 Asymptomatic menopausal state: Secondary | ICD-10-CM | POA: Diagnosis not present

## 2019-10-17 NOTE — Telephone Encounter (Signed)
I called the patient and she stated she is getting her bone density today.  Kelly [name],cma

## 2019-10-20 ENCOUNTER — Encounter (HOSPITAL_COMMUNITY): Payer: Self-pay | Admitting: Emergency Medicine

## 2019-10-20 ENCOUNTER — Other Ambulatory Visit: Payer: Self-pay

## 2019-10-20 ENCOUNTER — Emergency Department (HOSPITAL_COMMUNITY)
Admission: EM | Admit: 2019-10-20 | Discharge: 2019-10-20 | Disposition: A | Discharge status: Home / Self Care | Payer: Medicare Other | Attending: Emergency Medicine | Admitting: Emergency Medicine

## 2019-10-20 DIAGNOSIS — Z7982 Long term (current) use of aspirin: Secondary | ICD-10-CM | POA: Diagnosis not present

## 2019-10-20 DIAGNOSIS — I129 Hypertensive chronic kidney disease with stage 1 through stage 4 chronic kidney disease, or unspecified chronic kidney disease: Secondary | ICD-10-CM | POA: Insufficient documentation

## 2019-10-20 DIAGNOSIS — Z85828 Personal history of other malignant neoplasm of skin: Secondary | ICD-10-CM | POA: Insufficient documentation

## 2019-10-20 DIAGNOSIS — Z79899 Other long term (current) drug therapy: Secondary | ICD-10-CM | POA: Diagnosis not present

## 2019-10-20 DIAGNOSIS — E1165 Type 2 diabetes mellitus with hyperglycemia: Secondary | ICD-10-CM | POA: Diagnosis present

## 2019-10-20 DIAGNOSIS — R739 Hyperglycemia, unspecified: Secondary | ICD-10-CM

## 2019-10-20 DIAGNOSIS — Z794 Long term (current) use of insulin: Secondary | ICD-10-CM | POA: Diagnosis not present

## 2019-10-20 DIAGNOSIS — N184 Chronic kidney disease, stage 4 (severe): Secondary | ICD-10-CM | POA: Insufficient documentation

## 2019-10-20 DIAGNOSIS — E1122 Type 2 diabetes mellitus with diabetic chronic kidney disease: Secondary | ICD-10-CM | POA: Diagnosis not present

## 2019-10-20 LAB — URINALYSIS, ROUTINE W REFLEX MICROSCOPIC
Bacteria, UA: NONE SEEN
Bilirubin Urine: NEGATIVE
Glucose, UA: >=500 mg/dL — AB
Hgb urine dipstick: NEGATIVE
Ketones, ur: NEGATIVE mg/dL
Leukocytes,Ua: NEGATIVE
Nitrite: NEGATIVE
Protein, ur: NEGATIVE mg/dL
Specific Gravity, Urine: 1.024 (ref 1.005–1.030)
pH: 6 (ref 5.0–8.0)

## 2019-10-20 LAB — CBC
HCT: 43.4 % (ref 36.0–46.0)
Hemoglobin: 14.1 g/dL (ref 12.0–15.0)
MCH: 31.7 pg (ref 26.0–34.0)
MCHC: 32.5 g/dL (ref 30.0–36.0)
MCV: 97.5 fL (ref 80.0–100.0)
Platelets: 163 10*3/uL (ref 150–400)
RBC: 4.45 MIL/uL (ref 3.87–5.11)
RDW: 12.9 % (ref 11.5–15.5)
WBC: 9.7 10*3/uL (ref 4.0–10.5)
nRBC: 0 % (ref 0.0–0.2)

## 2019-10-20 LAB — BASIC METABOLIC PANEL
Anion gap: 10 (ref 5–15)
BUN: 55 mg/dL — ABNORMAL HIGH (ref 8–23)
CO2: 23 mmol/L (ref 22–32)
Calcium: 9.3 mg/dL (ref 8.9–10.3)
Chloride: 98 mmol/L (ref 98–111)
Creatinine, Ser: 2.68 mg/dL — ABNORMAL HIGH (ref 0.44–1.00)
GFR calc Af Amer: 19 mL/min — ABNORMAL LOW (ref >60)
GFR calc non Af Amer: 16 mL/min — ABNORMAL LOW (ref >60)
Glucose, Bld: 731 mg/dL (ref 70–99)
Potassium: 4.9 mmol/L (ref 3.5–5.1)
Sodium: 131 mmol/L — ABNORMAL LOW (ref 135–145)

## 2019-10-20 LAB — CBG MONITORING, ED
Glucose-Capillary: >600 mg/dL (ref 70–99)
Glucose-Capillary: 558 mg/dL (ref 70–99)
Glucose-Capillary: 420 mg/dL — ABNORMAL HIGH (ref 70–99)

## 2019-10-20 MED ORDER — SODIUM CHLORIDE 0.9 % IV BOLUS
2000.0000 mL | Freq: Once | INTRAVENOUS | Status: AC
  Administered 2019-10-20: 2000 mL via INTRAVENOUS

## 2019-10-20 MED ORDER — INSULIN ASPART 100 UNIT/ML ~~LOC~~ SOLN
15.0000 [IU] | Freq: Once | SUBCUTANEOUS | Status: AC
  Administered 2019-10-20: 10 [IU] via SUBCUTANEOUS

## 2019-10-20 NOTE — ED Triage Notes (Signed)
Pt states blood sugar was 600 at home.  Denies any complaints other than "a little lightheaded".

## 2019-10-20 NOTE — ED Notes (Signed)
CBG Results of 558 reported to [place], RN.

## 2019-10-20 NOTE — Discharge Instructions (Signed)
Try to follow your regular diabetic diet.  Make sure that you are drinking an adequate amount of water, 50 - 75 ounces per day.  Take your regular medications, both this evening and tomorrow morning.

## 2019-10-20 NOTE — ED Provider Notes (Signed)
[facility] EMERGENCY DEPARTMENT Provider Note   CSN: [id number] Arrival date & time: 10/20/19  1710     History Chief Complaint  Patient presents with  . Hyperglycemia    Kelly [name] is a 79 y.o. female.  HPI She presents for evaluation of high blood sugar.  She checked her blood sugar after lunch today and found it to be over 600.  She tried to drink some water but the sugar did not lower.  She recalls eating chocolate cake, and macaroni cheese, at lunch today.  She has been at a convention for the last 3 days and eating non carbohydrate modified diet.  Yesterday her blood sugar was over 400.  Typically her blood sugar is in the low 100s.  She denies symptoms other than mild tiredness.  She denies nausea, vomiting, fever, chills, cough, shortness of breath, weakness or dizziness.  She took her usual medications, this morning.  There are no other known modifying factors.    Past Medical History:  Diagnosis Date  . Colon polyps   . Depression   . DM (diabetes mellitus), type 2 with renal complications ([place])   . Hyperlipidemia   . Hypertension   . Kidney stones   . Sleep apnea   . 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     Patient Active Problem List   Diagnosis Date Noted  . Dry eyes 10/01/2019  . Tick bite of left hip 10/01/2019  . Vomiting 10/01/2019  . History of vomiting 07/03/2019  . Fall 04/15/2019  . Occipital neuralgia of left side 07/23/2018  . Osteoporosis 07/23/2018  . Muscle cramps 02/12/2018  . Numbness and tingling of left thumb 10/25/2017  . Rectal lesion 09/07/2017  . Height loss 08/15/2017  . 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 Cervical spondylosis without myelopathy 08/15/2017  . DDD (degenerative disc disease), cervical 08/15/2017  . Bursitis of hip 08/15/2017  . Female stress incontinence 08/15/2017  . Muscle weakness 08/15/2017  . Neck pain 08/15/2017  . Spondylolisthesis, congenital  08/15/2017  . Radial styloid tenosynovitis 08/15/2017  . Senile osteoporosis 08/15/2017  . Shoulder joint pain 08/15/2017  . Anxiety and depression 07/25/2017  . Rectal bleeding 07/25/2017  . Essential tremor 04/26/2017  . Other fatigue 03/22/2017  . Left ventricular hypertrophy 10/12/2016  . CKD (chronic kidney disease) stage 4, GFR 15-29 ml/min ([facility]) 08/26/2016  . OSA on CPAP 08/26/2016  . DM (diabetes mellitus), type 2 with renal complications ([place])   . Hyperlipidemia   . Hypertension     Past Surgical History:  Procedure Laterality Date  . APPENDECTOMY    . BREAST BIOPSY Right 06/19/2018   affirm stereo/x clip/COLUMNAR CELL CHANGE WITH MICROCALCIFICATIONS. FIBROCYSTIC CHANGES WITH MICROCALCIFICATIONS  . cataract  03/2017  . CHOLECYSTECTOMY    . COLONOSCOPY    . COLONOSCOPY WITH PROPOFOL N/A 08/22/2017   Procedure: COLONOSCOPY WITH PROPOFOL;  Surgeon: Jonathon Bellows, MD;  Location: [facility] ENDOSCOPY;  Service: Gastroenterology;  Laterality: N/A;  . ESOPHAGOGASTRODUODENOSCOPY    . ESOPHAGOGASTRODUODENOSCOPY (EGD) WITH PROPOFOL N/A 03/23/2016   Procedure: ESOPHAGOGASTRODUODENOSCOPY (EGD) WITH PROPOFOL;  Surgeon: Manya Silvas, MD;  Location: [facility] ENDOSCOPY;  Service: Endoscopy;  Laterality: N/A;  . FLEXIBLE SIGMOIDOSCOPY N/A 02/06/2018   Procedure: FLEXIBLE SIGMOIDOSCOPY;  Surgeon: Jonathon Bellows, MD;  Location: [facility] ENDOSCOPY;  Service: Gastroenterology;  Laterality: N/A;  . FLEXIBLE SIGMOIDOSCOPY N/A 12/03/2018   Procedure: FLEXIBLE SIGMOIDOSCOPY;  Surgeon: Jonathon Bellows, MD;  Location: [facility] ENDOSCOPY;  Service: Gastroenterology;  Laterality: N/A;  . RECTAL PROLAPSE REPAIR  2012   x2     OB History   No obstetric history on file.     Family History  Problem Relation Age of Onset  . Stroke Mother   . Arthritis Mother   . Aortic aneurysm Sister   . Lung cancer Sister   . Heart attack Father 80  . Breast cancer Sister 16    Social History   Tobacco Use  . Smoking status:  Never Smoker  . Smokeless tobacco: Never Used  Vaping Use  . Vaping Use: Never used  Substance Use Topics  . Alcohol use: No  . Drug use: No    Home Medications Prior to Admission medications   Medication Sig Start Date End Date Taking? Authorizing Provider  ALPRAZolam Duanne Moron) 0.5 MG tablet Take 0.5 tablets (0.25 mg total) by mouth 2 (two) times daily as needed for anxiety. Patient taking differently: Take 0.5 mg by mouth 2 (two) times daily as needed for anxiety.  10/25/17   [name], MD  amLODipine (NORVASC) 5 MG tablet Take 1 tablet (5 mg total) by mouth daily. 07/11/19   [name], MD  aspirin EC 81 MG tablet Take 81 mg by mouth daily.    [provider]  atorvastatin (LIPITOR) 80 MG tablet TAKE 1 TABLET DAILY 09/18/19   [name], MD  calcitRIOL (ROCALTROL) 0.25 MCG capsule Take 0.25 mcg by mouth daily.    [provider]  Calcium Citrate-Vitamin D (CALCIUM CITRATE +D PO) Take 600 mg by mouth daily.    [provider]  Cholecalciferol (VITAMIN D) 50 MCG (2000 UT) CAPS Take 2,000 Units by mouth daily.     [provider]  cyclobenzaprine (FLEXERIL) 10 MG tablet TAKE 1/2 TABLET (5MG  TOTAL)3 TIMES A DAY AS NEEDED    FOR MUSCLE SPASMS 09/24/18   [name], MD  denosumab (PROLIA) 60 MG/ML SOSY injection Inject 60 mg into the skin every 6 (six) months.    [provider]  docusate sodium (COLACE) 100 MG capsule Take 100 mg by mouth 2 (two) times daily.    [provider]  doxycycline (VIBRA-TABS) 100 MG tablet Take 1 tablet (100 mg total) by mouth 2 (two) times daily. 10/01/19   [name], MD  furosemide (LASIX) 40 MG tablet Take 1 tablet (40 mg total) by mouth daily as needed. 01/11/17   Coral Spikes, DO  gabapentin (NEURONTIN) 100 MG capsule Take 100 mg by mouth 2 (two) times daily.    [provider]  glucosamine-chondroitin 500-400 MG tablet Take 1 tablet by mouth 3 (three) times daily.     [provider]  glucose blood (ACCU-CHEK AVIVA PLUS) test strip Use to check blood sugar up to 4 times daily 07/30/18   [name], MD  HYDROcodone-acetaminophen (NORCO) 5-325 MG tablet Norco 5 mg-325 mg tablet  Take 1 tablet twice a day by oral route as needed.    [provider]  Insulin Glargine (BASAGLAR KWIKPEN) 100 UNIT/ML INJECT 20 UNITS TOTAL      SUBCUTANEOUSLY DAILY 09/19/19   [name], MD  Insulin Pen Needle (BD PEN NEEDLE NANO U/F) 32G X 4 MM MISC USE DAILY WITH VICTOZA AND BASAGLAR 09/19/19   [name], MD  Lancets MISC Use up to 4 times daily to check blood sugars. 11/04/16   Coral Spikes, DO  losartan (COZAAR) 50 MG tablet TAKE 1 TABLET DAILY  10/08/19   [name], MD  Melatonin 5 MG CHEW Chew 5 mg by mouth daily.    [provider]  PARoxetine (PAXIL-CR) 25 MG 24 hr tablet TAKE 1 TABLET DAILY 09/18/19   [name], MD  potassium chloride (K-DUR) 10 MEQ tablet potassium chloride ER 10 mEq tablet,extended release    [provider]  VICTOZA 18 MG/3ML SOPN INJECT 0.3ML (1.8MG  TOTAL) SUBCUTANEOUSLY DAILY 01/11/19   [name], MD    Allergies    Prilosec otc [omeprazole magnesium], Sucralfate, Amoxicillin, and Morphine and related  Review of Systems   Review of Systems  All other systems reviewed and are negative.   Physical Exam Updated Vital Signs BP (!) 151/67 (BP Location: Right Arm)   Pulse 75   Temp 98.3 F (36.8 C) (Oral)   Resp 20   Ht 5\' 3"  (1.6 m)   Wt 61.7 kg   SpO2 98%   BMI 24.09 kg/m   Physical Exam Vitals and nursing note reviewed.  Constitutional:      General: She is not in acute distress.    Appearance: She is well-developed. She is not ill-appearing, toxic-appearing or diaphoretic.  HENT:     Head: Normocephalic and atraumatic.  Eyes:     Conjunctiva/sclera: Conjunctivae normal.     Pupils: Pupils are equal, round, and reactive to light.  Neck:     Trachea:  Phonation normal.  Cardiovascular:     Rate and Rhythm: Normal rate and regular rhythm.  Pulmonary:     Effort: Pulmonary effort is normal.     Breath sounds: Normal breath sounds.  Chest:     Chest wall: No tenderness.  Abdominal:     General: There is no distension.     Palpations: Abdomen is soft.     Tenderness: There is no abdominal tenderness. There is no guarding.  Musculoskeletal:        General: Normal range of motion.     Cervical back: Normal range of motion and neck supple.  Skin:    General: Skin is warm and dry.  Neurological:     Mental Status: She is alert and oriented to person, place, and time.     Motor: No abnormal muscle tone.  Psychiatric:        Mood and Affect: Mood normal.        Behavior: Behavior normal.        Thought Content: Thought content normal.        Judgment: Judgment normal.     ED Results / Procedures / Treatments   Labs (all labs ordered are listed, but only abnormal results are displayed) Labs Reviewed  BASIC METABOLIC PANEL - Abnormal; Notable for the following components:      Result Value   Sodium 131 (*)    Glucose, Bld 731 (*)    BUN 55 (*)    Creatinine, Ser 2.68 (*)    GFR calc non Af Amer 16 (*)    GFR calc Af Amer 19 (*)    All other components within normal limits  URINALYSIS, ROUTINE W REFLEX MICROSCOPIC - Abnormal; Notable for the following components:   Color, Urine STRAW (*)    Glucose, UA >=500 (*)    All other components within normal limits  CBG MONITORING, ED - Abnormal; Notable for the following components:   Glucose-Capillary >600 (*)    All other components within normal limits  CBG MONITORING, ED - Abnormal; Notable for the following  components:   Glucose-Capillary 558 (*)    All other components within normal limits  CBG MONITORING, ED - Abnormal; Notable for the following components:   Glucose-Capillary 420 (*)    All other components within normal limits  CBC  CBG MONITORING, ED    EKG None   Radiology No results found.  Procedures .Critical Care Performed by: Daleen Bo, MD Authorized by: Daleen Bo, MD   Critical care provider statement:    Critical care time (minutes):  35   Critical care start time:  10/20/2019 7:05 PM   Critical care end time:  10/20/2019 9:07 PM   Critical care time was exclusive of:  Separately billable procedures and treating other patients   Critical care was necessary to treat or prevent imminent or life-threatening deterioration of the following conditions:  Metabolic crisis   Critical care was time spent personally by me on the following activities:  Blood draw for specimens, development of treatment plan with patient or surrogate, discussions with consultants, evaluation of patient's response to treatment, examination of patient, obtaining history from patient or surrogate, ordering and performing treatments and interventions, ordering and review of laboratory studies, pulse oximetry, re-evaluation of patient's condition, review of old charts and ordering and review of radiographic studies   (including critical care time)  Medications Ordered in ED Medications  sodium chloride 0.9 % bolus 2,000 mL (2,000 mLs Intravenous New Bag/Given 10/20/19 1941)  insulin aspart (novoLOG) injection 15 Units (10 Units Subcutaneous Given 10/20/19 1959)    ED Course  I have reviewed the triage vital signs and the nursing notes.  Pertinent labs & imaging results that were available during my care of the patient were reviewed by me and considered in my medical decision making (see chart for details).  Clinical Course as of Oct 19 2105  Sun Oct 20, 2019  1944 Normal except sodium low, glucose high, BUN high, creatinine high, GFR low  Basic metabolic panel(!!) [EW]  8588 Normal except glucose high  Urinalysis, Routine w reflex microscopic(!) [EW]  1945 Normal  CBC [EW]  1945 Elevated, somewhat improved  CBG monitoring, ED(!!) [EW]  2104 Improving CBG,  420  POC CBG, ED(!) [EW]    Clinical Course User Index [EW] Daleen Bo, MD   MDM Rules/Calculators/A&P                           Patient Vitals for the past 24 hrs:  BP Temp Temp src Pulse Resp SpO2 Height Weight  10/20/19 1717 (!) 151/67 98.3 F (36.8 C) Oral 75 20 98 % 5\' 3"  (1.6 m) 61.7 kg    9:05 PM Reevaluation with update and discussion. After initial assessment and treatment, an updated evaluation reveals she remains comfortable and has completed 2 L normal saline.  Findings discussed and questions answered. Daleen Bo   Medical Decision Making:  This patient is presenting for evaluation of high blood sugar, which does require a range of treatment options, and is a complaint that involves a moderate risk of morbidity and mortality. The differential diagnoses include DKA, hyperglycemia, medication noncompliance, inappropriate dietary intake. I decided to review old records, and in summary elderly female who asserts she is taking her usual medicines, and acknowledges poor compliance with diet for several days.  I obtained additional historical information from her daughter at the bedside.  Clinical Laboratory Tests Ordered, included CBC, Metabolic panel and Urinalysis. Review indicates hyperglycemia with elevated BUN and creatinine higher than  baseline.    Critical Interventions-clinical evaluation, laboratory testing, IV fluid administration, insulin treatment, observation reassessment  After These Interventions, the Patient was reevaluated and was found improved and stable for discharge.  CRITICAL CARE-yes Performed by: Daleen Bo  Nursing Notes Reviewed/ Care Coordinated Applicable Imaging Reviewed Interpretation of Laboratory Data incorporated into ED treatment  The patient appears reasonably screened and/or stabilized for discharge and I doubt any other medical condition or other [facility] requiring further screening, evaluation, or treatment in the ED at this  time prior to discharge.  Plan: Home Medications-continue usual medications; Home Treatments-normal carbohydrate diet 50 to 75 oz of water each day; return here if the recommended treatment, does not improve the symptoms; Recommended follow up-PCP checkup 1 week and as needed     Final Clinical Impression(s) / ED Diagnoses Final diagnoses:  Hyperglycemia    Rx / DC Orders ED Discharge Orders    None       Daleen Bo, MD 10/20/19 2107

## 2019-10-20 NOTE — ED Notes (Signed)
Pt ambulated to and from bathroom with a steady gait.

## 2019-10-22 ENCOUNTER — Telehealth: Payer: Self-pay | Admitting: Family Medicine

## 2019-10-22 NOTE — Telephone Encounter (Signed)
Patient daughter called and had concerns of her mother and the medication alprazolam,  She stated she took her mom to the ER on Sunday night because she was a t a hotel for a convention and the mom told the daughter her sugar was 60   so she picked her up and took her to the Er and when they checked it it was 736, she asked her mom if she took her insulin that morning and the mom stated yes she did, she thinks the medication Alprazolam is causing her to forget things. she states that her mom is taking 2 of these pills and claims it helps her sleep.  The daughter is concerned and wants her to take melatonin.  The daughter states she sees a difference in her mother while on this medication, her memory is not as good and her mood.  She stated a different doctor gives her this medication.  She rescheduled her appt so she can come in and be with her at her next visit. With you.  Suzzette Gasparro,cma

## 2019-10-22 NOTE — Telephone Encounter (Signed)
Pt daughter would like a call back before her mothers appt on 6/23

## 2019-10-23 NOTE — Telephone Encounter (Signed)
Noted. It is possible that the patient did not take her insulin given how elevated her glucose was. They should monitor her glucose and if it trends up like that again have her evaluated in person in the ED. We can discuss further at her follow-up.

## 2019-10-24 ENCOUNTER — Telehealth: Payer: Self-pay

## 2019-10-24 MED ORDER — GLIPIZIDE 5 MG PO TABS
5.0000 mg | ORAL_TABLET | Freq: Every day | ORAL | 3 refills | Status: DC
Start: 2019-10-24 — End: 2019-11-28

## 2019-10-24 NOTE — Telephone Encounter (Signed)
I called and spoke with the patient and her daughter, the daughter stated they forgot to mention that the patient had steroid injections in her hip on June 9th at [facility].  and was wondering would that be why her sugars was elevated because they are actually going back to normal right now.  I informed her I would talk with the provider and see if the new medication is still needed after that information

## 2019-10-24 NOTE — Telephone Encounter (Signed)
Called and LVM for the patient to call me back.  Kelly [name],cma  

## 2019-10-24 NOTE — Telephone Encounter (Signed)
Noted. Glipizide sent in for the patient to start on. She should take this right before breakfast in the morning. She will continue her other medications. She should contact us with her sugars tomorrow after taking the glipizide.

## 2019-10-24 NOTE — Telephone Encounter (Signed)
I called and spoke with the patient and she gave me her Sugars for yesterday and today as follows;   Wednesday 10/23/19- 2:30 am 531 11:30 am 318, not eating 2:20 pm 288, 5:30 pm 481, she ate a open face peanut butter and jelly sandwich 6:40 pm 373, 7:30 pm 366, 8 pm she took 2 units of basglar and at 10:50 pm 371 and 11 pm 459. Today 10/24/2019- 4:23 am 249, 7 am 223, and 11:21 am 174   Patient states she is having no symptoms at all and she is taking her Basglar as prescribed. Please advise.  Quadarius Henton,cma

## 2019-10-24 NOTE — Telephone Encounter (Signed)
-Caller states her mothers glucose level is 481 and they are wanting to know what to go. She got shots in bilateral hips on June 9th, they believe it might be a steroid shot. Sugar has been increasing since the shot. She takes victoza and basaglar. She is type 2 diabetic. She is asymptomatic. She is seeming sluggish today.   [place] RECORD [facility] Patient Name: Kelly [name] Gender: Female DOB: [birth year] Age: 79 Y 59 M 30 D Return Phone Number: [phone] (Primary) Address: City/State/Zip: [place] [address] Client [facility] Client Site [place] [place] Physician Tommi Rumps - MD Contact Type Call Who Is Calling Patient / Member / Family / Caregiver Call Type Triage / Clinical Caller Name Kelly [name] Relationship To Patient Daughter Return Phone Number [phone] (Primary) Chief Complaint Blood Sugar High Reason for Call Symptomatic / Request for [place] states her mothers glucose level is 481 and they are wanting to know what to go. Translation No Nurse Assessment Nurse: Enrigue Catena Date/Time Eilene Ghazi Time): 10/23/2019 6:09:41 PM Confirm and document reason for call. If symptomatic, describe symptoms. ---Caller states her mothers glucose level is 481 and they are wanting to know what to go. She got shots in bilateral hips on June 9th, they believe it might be a steroid shot. Sugar has been increasing since the shot. She takes victoza and basaglar. She is type 2 diabetic. She is asymptomatic. She is seeming sluggish today. Has the patient had close contact with a person known or suspected to have the novel coronavirus illness OR traveled / lives in area with major community spread (including international travel) in the last 14 days from the onset of symptoms? * If Asymptomatic,  screen for exposure and travel within the last 14 days. ---No Does the patient have any new or worsening symptoms? ---Yes Will a triage be completed? ---Yes Related visit to physician within the last 2 weeks? ---N/A Does the PT have any chronic conditions? (i.e. diabetes, asthma, this includes High risk factors for pregnancy, etc.) ---Yes List chronic conditions. ---diabetes type 2, HTN, sleep apnea, stage 4 kidney disease. Is this a behavioral health or substance abuse call? ---No Guidelines Guideline Title Affirmed Question Affirmed Notes Nurse Date/Time (Eastern Time) Diabetes - High Blood Sugar Blood glucose > 400 mg/ dL (22.2 mmol/L) Enrigue Catena 10/23/2019 6:14:24 PMPLEASE NOTE: All timestamps contained within this report are represented as [place] Standard Time. CONFIDENTIALTY NOTICE: This fax transmission is intended only for the addressee. It contains information that is legally privileged, confidential or otherwise protected from use or disclosure. If you are not the intended recipient, you are strictly prohibited from reviewing, disclosing, copying using or disseminating any of this information or taking any action in reliance on or regarding this information. If you have received this fax in error, please notify us immediately by telephone so that we can arrange for its return to [place]. Phone: [phone], Toll-Free: [phone], Fax: [phone] Page: 2 of 2 Call Id: [id number] [place]. Time Eilene Ghazi Time) Disposition Final User 10/23/2019 5:51:20 PM Attempt made - line busy Enrigue Catena 10/23/2019 6:23:09 PM Called On-Call Provider Enrigue Catena 10/23/2019 6:19:40 PM Call PCP Now Yes Baron Sane Disagree/Comply Comply Caller Understands Yes PreDisposition Call Doctor Care Advice Given Per Guideline CALL PCP NOW: * You need to discuss this with your doctor (or NP/PA). * I'll page the on-call provider now.  If you haven't heard from the provider (or me)  within 30 minutes, call again. CALL BACK IF: * Vomiting occurs * Rapid breathing occurs * You become worse. CARE ADVICE given per Diabetes - High Blood Sugar (Adult) guideline. Paging DoctorName Phone DateTime Result/Outcome Message Type Notes Deborra Medina - MD [phone] 10/23/2019 6:23:09 PM Called On Call Provider - Reached Doctor Paged Deborra Medina - MD 10/23/2019 6:23:38 PM Spoke with On Call - General Message Result Provider asked to be conferenced in with patient.

## 2019-10-24 NOTE — Telephone Encounter (Signed)
Please call the patient and see how she is currently feeling. Is she having any symptoms? Nausea, vomiting, or confusion? Please confirm that she is currently using basaglar 20 units once daily. Please also see what her sugar currently is. Thanks.

## 2019-10-24 NOTE — Addendum Note (Signed)
Addended by: Caryl Bis, [place] Recupero G on: 10/24/2019 02:00 PM   Modules accepted: Orders

## 2019-10-25 NOTE — Telephone Encounter (Signed)
She can go ahead and start on the glipizide. She needs to monitor her glucose closely and if she starts to have low sugars (<80) she needs to eat something and contact us.

## 2019-10-25 NOTE — Telephone Encounter (Signed)
I called and spoke with the patient and informed her that the provider is starting her on Glipizide and he sent it to her pharmacy and if her BS gets below 80, eat something and call and let us know, she understood.  Makaio Mach,cma

## 2019-10-25 NOTE — Telephone Encounter (Signed)
Can you find out what her numbers have been today and late yesterday?

## 2019-10-25 NOTE — Telephone Encounter (Signed)
I called and spoke with the patient and got her BS:  yesterday 11:21 am  BS 174, 3:15 pm BS 133, 4:15 pm BS 160, 8 pm 297, 10:25 pm 354 This morning patient checked BS and it was 284. Garv Kuechle,cma

## 2019-10-29 ENCOUNTER — Other Ambulatory Visit: Payer: Self-pay

## 2019-10-29 ENCOUNTER — Ambulatory Visit (INDEPENDENT_AMBULATORY_CARE_PROVIDER_SITE_OTHER): Payer: Medicare Other | Admitting: Podiatry

## 2019-10-29 ENCOUNTER — Encounter: Payer: Self-pay | Admitting: Family Medicine

## 2019-10-29 DIAGNOSIS — M2042 Other hammer toe(s) (acquired), left foot: Secondary | ICD-10-CM | POA: Diagnosis not present

## 2019-10-29 DIAGNOSIS — E119 Type 2 diabetes mellitus without complications: Secondary | ICD-10-CM | POA: Diagnosis not present

## 2019-10-29 DIAGNOSIS — M2041 Other hammer toe(s) (acquired), right foot: Secondary | ICD-10-CM

## 2019-10-30 ENCOUNTER — Ambulatory Visit: Payer: Medicare Other | Admitting: Family Medicine

## 2019-10-30 ENCOUNTER — Encounter: Payer: Self-pay | Admitting: Podiatry

## 2019-10-30 NOTE — Progress Notes (Signed)
Subjective: Kelly [name] presents today referred by [name], MD for diabetic foot evaluation.  Patient relates greater than 3 year history of diabetes.  Patient denies any history of foot wounds.  Patient denies any history of numbness, tingling, burning, pins/needles sensations.  Past Medical History:  Diagnosis Date  . Colon polyps   . Depression   . DM (diabetes mellitus), type 2 with renal complications ([place])   . Hyperlipidemia   . Hypertension   . Kidney stones   . Sleep apnea   . 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     Patient Active Problem List   Diagnosis Date Noted  . Dry eyes 10/01/2019  . Tick bite of left hip 10/01/2019  . Vomiting 10/01/2019  . History of vomiting 07/03/2019  . Fall 04/15/2019  . Occipital neuralgia of left side 07/23/2018  . Osteoporosis 07/23/2018  . Muscle cramps 02/12/2018  . Numbness and tingling of left thumb 10/25/2017  . Rectal lesion 09/07/2017  . Height loss 08/15/2017  . 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 Cervical spondylosis without myelopathy 08/15/2017  . DDD (degenerative disc disease), cervical 08/15/2017  . Bursitis of hip 08/15/2017  . Female stress incontinence 08/15/2017  . Muscle weakness 08/15/2017  . Neck pain 08/15/2017  . Spondylolisthesis, congenital 08/15/2017  . Radial styloid tenosynovitis 08/15/2017  . Senile osteoporosis 08/15/2017  . Shoulder joint pain 08/15/2017  . Anxiety and depression 07/25/2017  . Rectal bleeding 07/25/2017  . Essential tremor 04/26/2017  . Other fatigue 03/22/2017  . Left ventricular hypertrophy 10/12/2016  . CKD (chronic kidney disease) stage 4, GFR 15-29 ml/min ([facility]) 08/26/2016  . OSA on CPAP 08/26/2016  . DM (diabetes mellitus), type 2 with renal complications ([place])   . Hyperlipidemia   . Hypertension     Past Surgical History:  Procedure Laterality Date  . APPENDECTOMY    . BREAST BIOPSY Right 06/19/2018   affirm  stereo/x clip/COLUMNAR CELL CHANGE WITH MICROCALCIFICATIONS. FIBROCYSTIC CHANGES WITH MICROCALCIFICATIONS  . cataract  03/2017  . CHOLECYSTECTOMY    . COLONOSCOPY    . COLONOSCOPY WITH PROPOFOL N/A 08/22/2017   Procedure: COLONOSCOPY WITH PROPOFOL;  Surgeon: Jonathon Bellows, MD;  Location[facility] ENDOSCOPY;  Service: Gastroenterology;  Laterality: N/A;  . ESOPHAGOGASTRODUODENOSCOPY    . ESOPHAGOGASTRODUODENOSCOPY (EGD) WITH PROPOFOL N/A 03/23/2016   Procedure: ESOPHAGOGASTRODUODENOSCOPY (EGD) WITH PROPOFOL;  Surgeon: Manya Silvas, MD;  Location: [facility] ENDOSCOPY;  Service: Endoscopy;  Laterality: N/A;  . FLEXIBLE SIGMOIDOSCOPY N/A 02/06/2018   Procedure: FLEXIBLE SIGMOIDOSCOPY;  Surgeon: Jonathon Bellows, MD;  Location: [facility] ENDOSCOPY;  Service: Gastroenterology;  Laterality: N/A;  . FLEXIBLE SIGMOIDOSCOPY N/A 12/03/2018   Procedure: FLEXIBLE SIGMOIDOSCOPY;  Surgeon: Jonathon Bellows, MD;  Location: [facility] ENDOSCOPY;  Service: Gastroenterology;  Laterality: N/A;  . RECTAL PROLAPSE REPAIR  2012   x2    Current Outpatient Medications on File Prior to Visit  Medication Sig Dispense Refill  . ALPRAZolam (XANAX) 0.25 MG tablet Take 0.25 mg by mouth 2 (two) times daily as needed for anxiety.    . ALPRAZolam (XANAX) 0.5 MG tablet Take 0.5 tablets (0.25 mg total) by mouth 2 (two) times daily as needed for anxiety. 30 tablet 0  . amLODipine (NORVASC) 5 MG tablet Take 1 tablet (5 mg total) by mouth daily. (Patient taking differently: Take 5 mg by mouth at bedtime. ) 30 tablet 0  . aspirin EC 81 MG tablet Take 81 mg by mouth daily.    Marland Kitchen atorvastatin (LIPITOR)  80 MG tablet TAKE 1 TABLET DAILY 90 tablet 1  . calcitRIOL (ROCALTROL) 0.25 MCG capsule Take 0.25 mcg by mouth daily.    . Calcium Citrate-Vitamin D (CALCIUM CITRATE +D PO) Take 600 mg by mouth daily.    . Cholecalciferol (VITAMIN D) 50 MCG (2000 UT) CAPS Take 2,000 Units by mouth daily.     . cyclobenzaprine (FLEXERIL) 10 MG tablet TAKE 1/2 TABLET (5MG  TOTAL)3  TIMES A DAY AS NEEDED    FOR MUSCLE SPASMS 45 tablet 0  . denosumab (PROLIA) 60 MG/ML SOSY injection Inject 60 mg into the skin every 6 (six) months.    . docusate sodium (COLACE) 100 MG capsule Take 100 mg by mouth 2 (two) times daily.    Marland Kitchen doxycycline (VIBRA-TABS) 100 MG tablet Take 1 tablet (100 mg total) by mouth 2 (two) times daily. 14 tablet 0  . furosemide (LASIX) 40 MG tablet Take 1 tablet (40 mg total) by mouth daily as needed. 90 tablet 0  . gabapentin (NEURONTIN) 100 MG capsule Take 100 mg by mouth 2 (two) times daily.    Marland Kitchen glipiZIDE (GLUCOTROL) 5 MG tablet Take 1 tablet (5 mg total) by mouth daily before breakfast. 30 tablet 3  . glucosamine-chondroitin 500-400 MG tablet Take 1 tablet by mouth 3 (three) times daily.    Marland Kitchen glucose blood (ACCU-CHEK AVIVA PLUS) test strip Use to check blood sugar up to 4 times daily 100 each 3  . HYDROcodone-acetaminophen (NORCO) 5-325 MG tablet Norco 5 mg-325 mg tablet  Take 1 tablet twice a day by oral route as needed.    . Insulin Glargine (BASAGLAR KWIKPEN) 100 UNIT/ML INJECT 20 UNITS TOTAL      SUBCUTANEOUSLY DAILY 15 mL 0  . Insulin Pen Needle (BD PEN NEEDLE NANO U/F) 32G X 4 MM MISC USE DAILY WITH VICTOZA AND BASAGLAR 180 each 2  . Lancets MISC Use up to 4 times daily to check blood sugars. 200 each 11  . losartan (COZAAR) 50 MG tablet TAKE 1 TABLET DAILY 90 tablet 1  . Melatonin 5 MG CHEW Chew 5 mg by mouth daily.    . naproxen (NAPROSYN) 375 MG tablet naproxen 375 mg tablet  Take 1 tablet twice a day by oral route with meals.    Marland Kitchen PARoxetine (PAXIL-CR) 25 MG 24 hr tablet TAKE 1 TABLET DAILY 90 tablet 3  . potassium chloride (K-DUR) 10 MEQ tablet potassium chloride ER 10 mEq tablet,extended release    . VICTOZA 18 MG/3ML SOPN INJECT 0.3ML (1.8MG  TOTAL) SUBCUTANEOUSLY DAILY 27 mL 3   No current facility-administered medications on file prior to visit.     Allergies  Allergen Reactions  . Prilosec Otc [Omeprazole Magnesium] Other (See  Comments)    Maybe cause of acute interstitial nephritis  . Sucralfate Other (See Comments)    Maybe cause of acute interstitial nephritis  . Amoxicillin Diarrhea  . Morphine And Related Other (See Comments)    Chest pains Chest pains    Social History   Occupational History  . Not on file  Tobacco Use  . Smoking status: Never Smoker  . Smokeless tobacco: Never Used  Vaping Use  . Vaping Use: Never used  Substance and Sexual Activity  . Alcohol use: No  . Drug use: No  . Sexual activity: Not Currently    Partners: Male    Family History  Problem Relation Age of Onset  . Stroke Mother   . Arthritis Mother   . Aortic aneurysm Sister   .  Lung cancer Sister   . Heart attack Father 31  . Breast cancer Sister 67    Immunization History  Administered Date(s) Administered  . Fluad Quad(high Dose 65+) 02/25/2019  . Influenza, High Dose Seasonal PF 02/24/2017, 02/28/2018  . Influenza,inj,quad, With Preservative 02/19/2016  . Moderna SARS-COVID-2 Vaccination 07/05/2019, 08/06/2019  . Td 02/26/2019    Review of systems: Positive Findings in bold print.  Constitutional:  chills, fatigue, fever, sweats, weight change Communication: Optometrist, sign Ecologist, hand writing, iPad/Android device Head: headaches, head injury Eyes: changes in vision, eye pain, glaucoma, cataracts, macular degeneration, diplopia, glare,  light sensitivity, eyeglasses or contacts, blindness Ears nose mouth throat: hearing impaired, hearing aids,  ringing in ears, deaf, sign language,  vertigo, nosebleeds,  rhinitis,  cold sores, snoring, swollen glands Cardiovascular: HTN, edema, arrhythmia, pacemaker in place, defibrillator in place, chest pain/tightness, chronic anticoagulation, blood clot, heart failure, MI Peripheral Vascular: leg cramps, varicose veins, blood clots, lymphedema, varicosities Respiratory:  asthma, difficulty breathing, denies congestion, SOB, wheezing, cough,  emphysema Gastrointestinal: change in appetite or weight, abdominal pain, constipation, diarrhea, nausea, vomiting, vomiting blood, change in bowel habits, abdominal pain, jaundice, rectal bleeding, hemorrhoids, GERD Genitourinary:  nocturia,  pain on urination, polyuria,  blood in urine, Foley catheter, urinary urgency, ESRD on hemodialysis Musculoskeletal: amputation, cramping, stiff joints, painful joints, decreased joint motion, fractures, OA, gout, hemiplegia, paraplegia, uses cane, wheelchair bound, uses walker, uses rollator Skin: +changes in toenails, color change, dryness, itching, mole changes,  rash, wound(s) Neurological: headaches, numbness in feet, paresthesias in feet, burning in feet, fainting,  seizures, change in speech, migraines, memory problems/poor historian, cerebral palsy, weakness, paralysis, CVA, TIA Endocrine: diabetes, hypothyroidism, hyperthyroidism,  goiter, dry mouth, flushing, heat intolerance, cold intolerance,  excessive thirst, denies polyuria,  nocturia Hematological:  easy bleeding, excessive bleeding, easy bruising, enlarged lymph nodes, on long term blood thinner, history of past transusions Allergy/immunological:  hives, eczema, frequent infections, multiple drug allergies, seasonal allergies, transplant recipient, multiple food allergies Psychiatric:  anxiety, depression, mood disorder, suicidal ideations, hallucinations, insomnia  Objective: There were no vitals filed for this visit. Vascular Examination: Capillary refill time within normal limits less than 3 seconds x 10 digits.  Dorsalis pedis pulses palpable.  Posterior tibial pulses palpable.  Digital hair not present x 10 digits.  Skin temperature gradient WNL b/l.  Dermatological Examination: Skin with normal turgor, texture and tone b/l  Toenails 1-5 b/l discolored, thick, dystrophic with subungual debris and pain with palpation to nailbeds due to thickness of  nails.  Musculoskeletal: Muscle strength 5/5 to all LE muscle groups.  Hammertoe contractures semireducible noted bilaterally 2 through 5.  No pain on palpation.  Neurological: Sensation intact with 10 gram monofilament.  Vibratory sensation intact.  Assessment: 1. NIDDM 2. Encounter for diabetic foot examination  Plan: 1. Discussed diabetic foot care principles. Literature dispensed on today. 2. Patient to continue soft, supportive shoe gear daily. 3. Patient to report any pedal injuries to medical professional immediately. 4. Follow up one year. 5. Patient/POA to call should there be a concern in the interim. 6. Patient will be scheduled to follow-up with Liliane Channel for diabetic shoes given that she has hammertoe contractures that could ultimately lead to ulceration if not properly offloaded with wide shoe gear modification with supportive insoles.

## 2019-11-01 ENCOUNTER — Ambulatory Visit (INDEPENDENT_AMBULATORY_CARE_PROVIDER_SITE_OTHER): Payer: Medicare Other

## 2019-11-01 VITALS — Ht 63.0 in | Wt 136.0 lb

## 2019-11-01 DIAGNOSIS — Z0000 Encounter for general adult medical examination without abnormal findings: Secondary | ICD-10-CM

## 2019-11-01 DIAGNOSIS — Z602 Problems related to living alone: Secondary | ICD-10-CM | POA: Diagnosis not present

## 2019-11-01 NOTE — Progress Notes (Addendum)
Subjective:   Kelly [name] is a 79 y.o. female who presents for Medicare Annual (Subsequent) preventive examination.  Review of Systems    No ROS.  Medicare Wellness Virtual Visit.    Cardiac Risk Factors include: advanced age (>12men, >47 women);diabetes mellitus;hypertension     Objective:    Today's Vitals   11/01/19 0906  Weight: 136 lb (61.7 kg)  Height: 5\' 3"  (1.6 m)   Body mass index is 24.09 kg/m.  Advanced Directives 11/01/2019 04/11/2019 12/03/2018 10/31/2018 02/06/2018 10/25/2017 08/22/2017  Does Patient Have a Medical Advance Directive? Yes No Yes Yes Yes Yes Yes  Type of Paramedic of [place];Living will - - [place];Living will [place];Living will [place];Living will -  Does patient want to make changes to medical advance directive? No - Patient declined - - No - Patient declined - No - Patient declined -  Copy of [place] in Chart? No - copy requested - - No - copy requested No - copy requested No - copy requested -  Would patient like information on creating a medical advance directive? - No - Patient declined - - - - -    Current Medications (verified) Outpatient Encounter Medications as of 11/01/2019  Medication Sig  . ALPRAZolam (XANAX) 0.25 MG tablet Take 0.25 mg by mouth 2 (two) times daily as needed for anxiety.  . ALPRAZolam (XANAX) 0.5 MG tablet Take 0.5 tablets (0.25 mg total) by mouth 2 (two) times daily as needed for anxiety.  Marland Kitchen amLODipine (NORVASC) 5 MG tablet Take 1 tablet (5 mg total) by mouth daily. (Patient taking differently: Take 5 mg by mouth at bedtime. )  . aspirin EC 81 MG tablet Take 81 mg by mouth daily.  Marland Kitchen atorvastatin (LIPITOR) 80 MG tablet TAKE 1 TABLET DAILY  . calcitRIOL (ROCALTROL) 0.25 MCG capsule Take 0.25 mcg by mouth daily.  . Calcium Citrate-Vitamin D (CALCIUM CITRATE +D PO) Take 600 mg by mouth daily.  .  Cholecalciferol (VITAMIN D) 50 MCG (2000 UT) CAPS Take 2,000 Units by mouth daily.   . cyclobenzaprine (FLEXERIL) 10 MG tablet TAKE 1/2 TABLET (5MG  TOTAL)3 TIMES A DAY AS NEEDED    FOR MUSCLE SPASMS  . denosumab (PROLIA) 60 MG/ML SOSY injection Inject 60 mg into the skin every 6 (six) months.  . docusate sodium (COLACE) 100 MG capsule Take 100 mg by mouth 2 (two) times daily.  Marland Kitchen doxycycline (VIBRA-TABS) 100 MG tablet Take 1 tablet (100 mg total) by mouth 2 (two) times daily.  . furosemide (LASIX) 40 MG tablet Take 1 tablet (40 mg total) by mouth daily as needed.  . gabapentin (NEURONTIN) 100 MG capsule Take 100 mg by mouth 2 (two) times daily.  Marland Kitchen glipiZIDE (GLUCOTROL) 5 MG tablet Take 1 tablet (5 mg total) by mouth daily before breakfast.  . glucosamine-chondroitin 500-400 MG tablet Take 1 tablet by mouth 3 (three) times daily.  Marland Kitchen glucose blood (ACCU-CHEK AVIVA PLUS) test strip Use to check blood sugar up to 4 times daily  . HYDROcodone-acetaminophen (NORCO) 5-325 MG tablet Norco 5 mg-325 mg tablet  Take 1 tablet twice a day by oral route as needed.  . Insulin Glargine (BASAGLAR KWIKPEN) 100 UNIT/ML INJECT 20 UNITS TOTAL      SUBCUTANEOUSLY DAILY  . Insulin Pen Needle (BD PEN NEEDLE NANO U/F) 32G X 4 MM MISC USE DAILY WITH VICTOZA AND BASAGLAR  . Lancets MISC Use up to 4  times daily to check blood sugars.  Marland Kitchen losartan (COZAAR) 50 MG tablet TAKE 1 TABLET DAILY  . Melatonin 5 MG CHEW Chew 5 mg by mouth daily.  . naproxen (NAPROSYN) 375 MG tablet naproxen 375 mg tablet  Take 1 tablet twice a day by oral route with meals.  Marland Kitchen PARoxetine (PAXIL-CR) 25 MG 24 hr tablet TAKE 1 TABLET DAILY  . potassium chloride (K-DUR) 10 MEQ tablet potassium chloride ER 10 mEq tablet,extended release  . VICTOZA 18 MG/3ML SOPN INJECT 0.3ML (1.8MG  TOTAL) SUBCUTANEOUSLY DAILY   No facility-administered encounter medications on file as of 11/01/2019.    Allergies (verified) Prilosec otc [omeprazole magnesium],  Sucralfate, Amoxicillin, and Morphine and related   History: Past Medical History:  Diagnosis Date  . Colon polyps   . Depression   . DM (diabetes mellitus), type 2 with renal complications ([place])   . Hyperlipidemia   . Hypertension   . Kidney stones   . Sleep apnea   . 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    Past Surgical History:  Procedure Laterality Date  . APPENDECTOMY    . BREAST BIOPSY Right 06/19/2018   affirm stereo/x clip/COLUMNAR CELL CHANGE WITH MICROCALCIFICATIONS. FIBROCYSTIC CHANGES WITH MICROCALCIFICATIONS  . cataract  03/2017  . CHOLECYSTECTOMY    . COLONOSCOPY    . COLONOSCOPY WITH PROPOFOL N/A 08/22/2017   Procedure: COLONOSCOPY WITH PROPOFOL;  Surgeon: Jonathon Bellows, MD;  Location: [facility] ENDOSCOPY;  Service: Gastroenterology;  Laterality: N/A;  . ESOPHAGOGASTRODUODENOSCOPY    . ESOPHAGOGASTRODUODENOSCOPY (EGD) WITH PROPOFOL N/A 03/23/2016   Procedure: ESOPHAGOGASTRODUODENOSCOPY (EGD) WITH PROPOFOL;  Surgeon: Manya Silvas, MD;  Location: [facility] ENDOSCOPY;  Service: Endoscopy;  Laterality: N/A;  . FLEXIBLE SIGMOIDOSCOPY N/A 02/06/2018   Procedure: FLEXIBLE SIGMOIDOSCOPY;  Surgeon: Jonathon Bellows, MD;  Location: [facility] ENDOSCOPY;  Service: Gastroenterology;  Laterality: N/A;  . FLEXIBLE SIGMOIDOSCOPY N/A 12/03/2018   Procedure: FLEXIBLE SIGMOIDOSCOPY;  Surgeon: Jonathon Bellows, MD;  Location: [facility] ENDOSCOPY;  Service: Gastroenterology;  Laterality: N/A;  . RECTAL PROLAPSE REPAIR  2012   x2   Family History  Problem Relation Age of Onset  . Stroke Mother   . Arthritis Mother   . Aortic aneurysm Sister   . Lung cancer Sister   . Heart attack Father 85  . Breast cancer Sister 93   Social History   Socioeconomic History  . Marital status: Widowed    Spouse name: Not on file  . Number of children: Not on file  . Years of education: Not on file  . Highest education level: Not on file  Occupational History  . Not on file  Tobacco Use  .  Smoking status: Never Smoker  . Smokeless tobacco: Never Used  Vaping Use  . Vaping Use: Never used  Substance and Sexual Activity  . Alcohol use: No  . Drug use: No  . Sexual activity: Not Currently    Partners: Male  Other Topics Concern  . Not on file  Social History Narrative  . Not on file   Social Determinants of Health   Financial Resource Strain:   . Difficulty of Paying Living Expenses:   Food Insecurity:   . Worried About Charity fundraiser in the Last Year:   . Arboriculturist in the Last Year:   Transportation Needs:   . Film/video editor (Medical):   Marland Kitchen Lack of Transportation (Non-Medical):   Physical Activity:   . Days of Exercise per Week:   .  Minutes of Exercise per Session:   Stress:   . Feeling of Stress :   Social Connections:   . Frequency of Communication with Friends and Family:   . Frequency of Social Gatherings with Friends and Family:   . Attends Religious Services:   . Active Member of Clubs or Organizations:   . Attends Archivist Meetings:   Marland Kitchen Marital Status:     Tobacco Counseling Counseling given: Not Answered   Clinical Intake:  Pre-visit preparation completed: Yes        Diabetes: Yes (Followed by pcp)  How often do you need to have someone help you when you read instructions, pamphlets, or other written materials from your doctor or pharmacy?: 1 - Never        Activities of Daily Living In your present state of health, do you have any difficulty performing the following activities: 11/01/2019  Hearing? Y  Comment Hearing aids  Vision? N  Difficulty concentrating or making decisions? N  Walking or climbing stairs? N  Dressing or bathing? N  Doing errands, shopping? N  Preparing Food and eating ? N  Using the Toilet? N  In the past six months, have you accidently leaked urine? N  Do you have problems with loss of bowel control? N  Managing your Medications? N  Managing your Finances? N  Housekeeping  or managing your Housekeeping? N  Some recent data might be hidden    Patient Care Team: [name], MD as PCP - General (Family Medicine) [name], [facility] as Pharmacist (Pharmacist)  Indicate any recent Medical Services you may have received from other than [facility] providers in the past year (date may be approximate).     Assessment:   This is a routine wellness examination for Jonay.  I connected with Evelynne today by telephone and verified that I am speaking with the correct person using two identifiers. Location patient: home Location provider: work Persons participating in the virtual visit: patient, Marine scientist.    I discussed the limitations, risks, security and privacy concerns of performing an evaluation and management service by telephone and the availability of in person appointments. The patient expressed understanding and verbally consented to this telephonic visit.    Interactive audio and video telecommunications were attempted between this provider and patient, however failed, due to patient having technical difficulties OR patient did not have access to video capability.  We continued and completed visit with audio only.  Some vital signs may be absent or patient reported.   Hearing/Vision screen  Hearing Screening   125Hz  250Hz  500Hz  1000Hz  2000Hz  3000Hz  4000Hz  6000Hz  8000Hz   Right ear:           Left ear:           Comments: Hearing aid, bilateral   Vision Screening Comments: Followed by Dr. Ellin Mayhew Wears corrective lenses Cataract extraction, bilateral Visual acuity not assessed, virtual visit.  They have seen their ophthalmologist in the last 12 months.     Dietary issues and exercise activities discussed: Current Exercise Habits: Home exercise routine, Type of exercise: walking, Time (Minutes): 20, Frequency (Times/Week): 5, Weekly Exercise (Minutes/Week): 100, Intensity: Mild  Goals      Patient Stated   .  "I want to work on my  diabetes" (pt-stated)      [place] (see longtitudinal plan of care for additional care plan information)  Current Barriers:  . Diabetes: uncontrolled at goal <7.5%; most recent A1c 7.6% . Current  antihyperglycemic regimen: Victoza 1.8 mg daily, Basaglar 20 units daily . Current glucose readings:  Fasting Before Supper After Supper  26-Apr 127  161  27-Apr 138  209  28-Apr 81 174 125  29-Apr 119    30-Apr 88    1-May 127 74 170  2-May 120  137  3-May 89    4-May 98 122   5-May 102    6-May 108 119   7-May 99 115   8-May 145 135 149  9-May 99  85  10-May 83    AVG 108 123 148   . Cardiovascular risk reduction: o Current hypertensive regimen: amlodipine 5 mg QPM, losartan 50 mg QAM  SBP DBP  26-Apr 134 80  27-Apr 135 77  28-Apr 133 71  29-Apr    30-Apr 134 80  1-May 135 76  2-May 134 77  3-May    4-May 134 80  5-May 134 72  6-May 133 72  7-May 136 78  8-May 135 70  9-May 140 75  10-May 135 77  AVG 135 76   o Current hyperlipidemia regimen: atorvastatin 80 mg daily; LDL now at goal <70 . CKD: calcitriol 0.25 mg daily.   Pharmacist Clinical Goal(s):  Marland Kitchen Over the next 90 days, patient with work with PharmD and primary care provider to address optimized management of diabetes  Interventions: . Comprehensive medication review performed, medication list updated in electronic medical record . Inter-disciplinary care team collaboration (see longitudinal plan of care) . Continue Basaglar 20 units daily and Victoza 1.8 mg daily. Avoiding sulfonylurea or mealtime insulin d/t risk of hypoglycemia, and patient's daughter's request to avoid anything with a significant risk of hypoglycemia. Appears that sugar readings are well controlled at this time. Patient aware of meals that drive up her sugars more than others.  . Reviewed BP readings. Patient notes these are at different times during the day. Continue current regimen.  . Reviewed lipid readings. LDL at goal.    Patient Self Care Activities:  . Patient will check blood glucose daily, document, and provide at future appointments . Patient will check BP daily document, and provide at future appointments . Patient will take medications as prescribed . Patient will report any questions or concerns to provider   Please see past updates related to this goal by clicking on the "Past Updates" button in the selected goal      .  Increase physical activity (pt-stated)      Walk up to 150 minutes per week for exercise (5 days, 30 minutes)      Depression Screen PHQ 2/9 Scores 11/01/2019 10/01/2019 07/03/2019 02/25/2019 10/31/2018 10/25/2017 08/15/2017  PHQ - 2 Score 0 0 0 0 0 0 0    Fall Risk Fall Risk  11/01/2019 10/01/2019 07/03/2019 02/25/2019 10/31/2018  Falls in the past year? 0 1 1 0 0  Comment None since last reported in November 2020 - - - -  Number falls in past yr: 0 0 0 0 -  Injury with Fall? - 0 1 - -  Risk Factor Category  - - - - -  Risk for fall due to : - - - - -  Follow up Falls evaluation completed Falls evaluation completed Falls evaluation completed Falls evaluation completed -    Handrails in use when climbing stairs Yes  Home free of loose throw rugs in walkways, pet beds, electrical cords, etc? Yes  Adequate lighting in your home to reduce risk of falls? Yes  ASSISTIVE DEVICES UTILIZED TO PREVENT FALLS:  Life alert? No  Use of a cane, walker or w/c? No  Grab bars in the bathroom? Yes  Shower chair or bench in shower? No  Elevated toilet seat or a handicapped toilet? Yes   TIMED UP AND GO:  Was the test performed? No . Virtual visit.  Cognitive Function:     6CIT Screen 11/01/2019 10/31/2018 10/25/2017  What Year? 0 points 0 points 0 points  What month? 0 points 0 points 0 points  What time? 0 points 0 points 0 points  Count back from 20 0 points 0 points 0 points  Months in reverse 0 points 0 points 0 points  Repeat phrase 0 points 0 points 0 points  Total Score 0 0  0   Immunizations Immunization History  Administered Date(s) Administered  . Fluad Quad(high Dose 65+) 02/25/2019  . Influenza, High Dose Seasonal PF 02/24/2017, 02/28/2018  . Influenza,inj,quad, With Preservative 02/19/2016  . Moderna SARS-COVID-2 Vaccination 07/05/2019, 08/06/2019  . Td 02/26/2019    Health Maintenance There are no preventive care reminders to display for this patient. Health Maintenance  Topic Date Due  . INFLUENZA VACCINE  12/08/2019  . OPHTHALMOLOGY EXAM  12/31/2019  . HEMOGLOBIN A1C  02/06/2020  . FOOT EXAM  09/30/2020  . TETANUS/TDAP  02/25/2029  . DEXA SCAN  Completed  . COVID-19 Vaccine  Completed  . Hepatitis C Screening  Completed  . PNA vac Low Risk Adult  Completed   Lung Cancer Screening: (Low Dose CT Chest recommended if Age 21-80 years, 30 pack-year currently smoking OR have quit w/in 15years.) does not qualify.   Dental Screening: Recommended annual dental exams for proper oral hygiene  Community Resource Referral / Chronic Care Management: CRR this visit?   Referral sent to connected care for life alert per patient request.   CCM required this visit?  No   Plan:   Keep all routine maintenance appointments.   11/13/19 @ 4:00  CCM 11/28/19 @ 3:00  Follow up 01/01/20 @ 1:15  Referral sent to connected care for life alert per patient request.   I have personally reviewed and noted the following in the patient's chart:   . Medical and social history . Use of alcohol, tobacco or illicit drugs  . Current medications and supplements . Functional ability and status . Nutritional status . Physical activity . Advanced directives . List of other physicians . Hospitalizations, surgeries, and ER visits in previous 12 months . Vitals . Screenings to include cognitive, depression, and falls . Referrals and appointments  In addition, I have reviewed and discussed with patient certain preventive protocols, quality metrics, and best practice  recommendations. A written personalized care plan for preventive services as well as general preventive health recommendations were provided to patient.     Varney Biles, LPN   11/05/4763

## 2019-11-01 NOTE — Progress Notes (Signed)
I have reviewed the above note and agree.  Kamarrion Stfort, M.D.  

## 2019-11-01 NOTE — Patient Instructions (Addendum)
[name] , Thank you for taking time to come for your Medicare Wellness Visit. I appreciate your ongoing commitment to your health goals. Please review the following plan we discussed and let me know if I can assist you in the future.   These are the goals we discussed: Goals      Patient Stated   .  "I want to work on my diabetes" (pt-stated)      Kelly [name] (see longtitudinal plan of care for additional care plan information)  Current Barriers:  . Diabetes: uncontrolled at goal <7.5%; most recent A1c 7.6% . Current antihyperglycemic regimen: Victoza 1.8 mg daily, Basaglar 20 units daily . Current glucose readings:  Fasting Before Supper After Supper  26-Apr 127  161  27-Apr 138  209  28-Apr 81 174 125  29-Apr 119    30-Apr 88    1-May 127 74 170  2-May 120  137  3-May 89    4-May 98 122   5-May 102    6-May 108 119   7-May 99 115   8-May 145 135 149  9-May 99  85  10-May 83    AVG 108 123 148   . Cardiovascular risk reduction: o Current hypertensive regimen: amlodipine 5 mg QPM, losartan 50 mg QAM  SBP DBP  26-Apr 134 80  27-Apr 135 77  28-Apr 133 71  29-Apr    30-Apr 134 80  1-May 135 76  2-May 134 77  3-May    4-May 134 80  5-May 134 72  6-May 133 72  7-May 136 78  8-May 135 70  9-May 140 75  10-May 135 77  AVG 135 76   o Current hyperlipidemia regimen: atorvastatin 80 mg daily; LDL now at goal <70 . CKD: calcitriol 0.25 mg daily.   Pharmacist Clinical Goal(s):  Marland Kitchen Over the next 90 days, patient with work with PharmD and primary care provider to address optimized management of diabetes  Interventions: . Comprehensive medication review performed, medication list updated in electronic medical record . Inter-disciplinary care team collaboration (see longitudinal plan of care) . Continue Basaglar 20 units daily and Victoza 1.8 mg daily. Avoiding sulfonylurea or mealtime insulin d/t risk of hypoglycemia, and patient's daughter's request to avoid  anything with a significant risk of hypoglycemia. Appears that sugar readings are well controlled at this time. Patient aware of meals that drive up her sugars more than others.  . Reviewed BP readings. Patient notes these are at different times during the day. Continue current regimen.  . Reviewed lipid readings. LDL at goal.   Patient Self Care Activities:  . Patient will check blood glucose daily, document, and provide at future appointments . Patient will check BP daily document, and provide at future appointments . Patient will take medications as prescribed . Patient will report any questions or concerns to provider   Please see past updates related to this goal by clicking on the "Past Updates" button in the selected goal      .  Increase physical activity (pt-stated)      Walk up to 150 minutes per week for exercise (5 days, 30 minutes)       This is a list of the screening recommended for you and due dates:  Health Maintenance  Topic Date Due  . Flu Shot  12/08/2019  . Eye exam for diabetics  12/31/2019  . Hemoglobin A1C  02/06/2020  . Complete foot exam   09/30/2020  . Tetanus  Vaccine  02/25/2029  . DEXA scan (bone density measurement)  Completed  . COVID-19 Vaccine  Completed  .  Hepatitis C: One time screening is recommended by Center for Disease Control  (CDC) for  adults born from [birth year] through 1964.   Completed  . Pneumonia vaccines  Completed   Immunizations Immunization History  Administered Date(s) Administered  . Fluad Quad(high Dose 65+) 02/25/2019  . Influenza, High Dose Seasonal PF 02/24/2017, 02/28/2018  . Influenza,inj,quad, With Preservative 02/19/2016  . Moderna SARS-COVID-2 Vaccination 07/05/2019, 08/06/2019  . Td 02/26/2019   Keep all routine maintenance appointments.   11/13/19 @ 4:00  CCM 11/28/19 @ 3:00  Follow up 01/01/20 @ 1:15  Advanced directives: End of life planning; Advance aging; Advanced directives discussed.  Copy of current  HCPOA/Living Will requested.    Conditions/risks identified: requests for life alert due to living alone. Referral sent to connected care for follow up.   Follow up in one year for your annual wellness visit.   Preventive Care 64 Years and Older, Female Preventive care refers to lifestyle choices and visits with your health care provider that can promote health and wellness. What does preventive care include?  A yearly physical exam. This is also called an annual well check.  Dental exams once or twice a year.  Routine eye exams. Ask your health care provider how often you should have your eyes checked.  Personal lifestyle choices, including:  Daily care of your teeth and gums.  Regular physical activity.  Eating a healthy diet.  Avoiding tobacco and drug use.  Limiting alcohol use.  Practicing safe sex.  Taking low-dose aspirin every day.  Taking vitamin and mineral supplements as recommended by your health care provider. What happens during an annual well check? The services and screenings done by your health care provider during your annual well check will depend on your age, overall health, lifestyle risk factors, and family history of disease. Counseling  Your health care provider may ask you questions about your:  Alcohol use.  Tobacco use.  Drug use.  Emotional well-being.  Home and relationship well-being.  Sexual activity.  Eating habits.  History of falls.  Memory and ability to understand (cognition).  Work and work Statistician.  Reproductive health. Screening  You may have the following tests or measurements:  Height, weight, and BMI.  Blood pressure.  Lipid and cholesterol levels. These may be checked every 5 years, or more frequently if you are over 19 years old.  Skin check.  Lung cancer screening. You may have this screening every year starting at age 65 if you have a 30-pack-year history of smoking and currently smoke or have  quit within the past 15 years.  Fecal occult blood test (FOBT) of the stool. You may have this test every year starting at age 26.  Flexible sigmoidoscopy or colonoscopy. You may have a sigmoidoscopy every 5 years or a colonoscopy every 10 years starting at age 70.  Hepatitis C blood test.  Hepatitis B blood test.  Sexually transmitted disease (STD) testing.  Diabetes screening. This is done by checking your blood sugar (glucose) after you have not eaten for a while (fasting). You may have this done every 1-3 years.  Bone density scan. This is done to screen for osteoporosis. You may have this done starting at age 52.  Mammogram. This may be done every 1-2 years. Talk to your health care provider about how often you should have regular mammograms. Talk with your health  care provider about your test results, treatment options, and if necessary, the need for more tests. Vaccines  Your health care provider may recommend certain vaccines, such as:  Influenza vaccine. This is recommended every year.  Tetanus, diphtheria, and acellular pertussis (Tdap, Td) vaccine. You may need a Td booster every 10 years.  Zoster vaccine. You may need this after age 66.  Pneumococcal 13-valent conjugate (PCV13) vaccine. One dose is recommended after age 88.  Pneumococcal polysaccharide (PPSV23) vaccine. One dose is recommended after age 81. Talk to your health care provider about which screenings and vaccines you need and how often you need them. This information is not intended to replace advice given to you by your health care provider. Make sure you discuss any questions you have with your health care provider. Document Released: 05/22/2015 Document Revised: 01/13/2016 Document Reviewed: 02/24/2015 Elsevier Interactive Patient Education  2017 [place] Prevention in the Home Falls can cause injuries. They can happen to people of all ages. There are many things you can do to make your home  safe and to help prevent falls. What can I do on the outside of my home?  Regularly fix the edges of walkways and driveways and fix any cracks.  Remove anything that might make you trip as you walk through a door, such as a raised step or threshold.  Trim any bushes or trees on the path to your home.  Use bright outdoor lighting.  Clear any walking paths of anything that might make someone trip, such as rocks or tools.  Regularly check to see if handrails are loose or broken. Make sure that both sides of any steps have handrails.  Any raised decks and porches should have guardrails on the edges.  Have any leaves, snow, or ice cleared regularly.  Use sand or salt on walking paths during winter.  Clean up any spills in your garage right away. This includes oil or grease spills. What can I do in the bathroom?  Use night lights.  Install grab bars by the toilet and in the tub and shower. Do not use towel bars as grab bars.  Use non-skid mats or decals in the tub or shower.  If you need to sit down in the shower, use a plastic, non-slip stool.  Keep the floor dry. Clean up any water that spills on the floor as soon as it happens.  Remove soap buildup in the tub or shower regularly.  Attach bath mats securely with double-sided non-slip rug tape.  Do not have throw rugs and other things on the floor that can make you trip. What can I do in the bedroom?  Use night lights.  Make sure that you have a light by your bed that is easy to reach.  Do not use any sheets or blankets that are too big for your bed. They should not hang down onto the floor.  Have a firm chair that has side arms. You can use this for support while you get dressed.  Do not have throw rugs and other things on the floor that can make you trip. What can I do in the kitchen?  Clean up any spills right away.  Avoid walking on wet floors.  Keep items that you use a lot in easy-to-reach places.  If you  need to reach something above you, use a strong step stool that has a grab bar.  Keep electrical cords out of the way.  Do not use floor  polish or wax that makes floors slippery. If you must use wax, use non-skid floor wax.  Do not have throw rugs and other things on the floor that can make you trip. What can I do with my stairs?  Do not leave any items on the stairs.  Make sure that there are handrails on both sides of the stairs and use them. Fix handrails that are broken or loose. Make sure that handrails are as long as the stairways.  Check any carpeting to make sure that it is firmly attached to the stairs. Fix any carpet that is loose or worn.  Avoid having throw rugs at the top or bottom of the stairs. If you do have throw rugs, attach them to the floor with carpet tape.  Make sure that you have a light switch at the top of the stairs and the bottom of the stairs. If you do not have them, ask someone to add them for you. What else can I do to help prevent falls?  Wear shoes that:  Do not have high heels.  Have rubber bottoms.  Are comfortable and fit you well.  Are closed at the toe. Do not wear sandals.  If you use a stepladder:  Make sure that it is fully opened. Do not climb a closed stepladder.  Make sure that both sides of the stepladder are locked into place.  Ask someone to hold it for you, if possible.  Clearly mark and make sure that you can see:  Any grab bars or handrails.  First and last steps.  Where the edge of each step is.  Use tools that help you move around (mobility aids) if they are needed. These include:  Canes.  Walkers.  Scooters.  Crutches.  Turn on the lights when you go into a dark area. Replace any light bulbs as soon as they burn out.  Set up your furniture so you have a clear path. Avoid moving your furniture around.  If any of your floors are uneven, fix them.  If there are any pets around you, be aware of where they  are.  Review your medicines with your doctor. Some medicines can make you feel dizzy. This can increase your chance of falling. Ask your doctor what other things that you can do to help prevent falls. This information is not intended to replace advice given to you by your health care provider. Make sure you discuss any questions you have with your health care provider. Document Released: 02/19/2009 Document Revised: 10/01/2015 Document Reviewed: 05/30/2014 Elsevier Interactive Patient Education  2017 [employer].

## 2019-11-04 ENCOUNTER — Telehealth: Payer: Self-pay | Admitting: Family Medicine

## 2019-11-04 DIAGNOSIS — E1122 Type 2 diabetes mellitus with diabetic chronic kidney disease: Secondary | ICD-10-CM

## 2019-11-04 DIAGNOSIS — N184 Chronic kidney disease, stage 4 (severe): Secondary | ICD-10-CM

## 2019-11-04 DIAGNOSIS — Z794 Long term (current) use of insulin: Secondary | ICD-10-CM

## 2019-11-04 NOTE — Telephone Encounter (Signed)
I have put the endo referral in. They should contact her to schedule.

## 2019-11-04 NOTE — Telephone Encounter (Signed)
I called the patient and informed her that her endocrinology referral was placed and someone from there will call and schedule her an appointment, she understood.  Saraiyah Hemminger,cma

## 2019-11-04 NOTE — Addendum Note (Signed)
Addended by: [name] on: 11/04/2019 02:15 PM   Modules accepted: Orders

## 2019-11-04 NOTE — Telephone Encounter (Signed)
Patient called and stated her BS is still running high and she is taking all the medications prescribed consistently. 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  I called an asked for her reading.  This morning before breakfast BS was 170,  at noon today it was 147.  Yesterday BS upon waking it was 294, 2:30 pm 173 and later that evening it was 174. Please advise.  Royale Swamy,cma

## 2019-11-04 NOTE — Telephone Encounter (Signed)
Pt fasting blood sugar are running between 370-170 in the morning and she has stage 4 kidney failure  Pt would like a referral to an endocrinologist Pt would like a call back

## 2019-11-12 DIAGNOSIS — R8290 Unspecified abnormal findings in urine: Secondary | ICD-10-CM | POA: Diagnosis not present

## 2019-11-12 DIAGNOSIS — N184 Chronic kidney disease, stage 4 (severe): Secondary | ICD-10-CM | POA: Diagnosis not present

## 2019-11-13 ENCOUNTER — Ambulatory Visit: Payer: Medicare Other | Admitting: Family Medicine

## 2019-11-15 DIAGNOSIS — E1169 Type 2 diabetes mellitus with other specified complication: Secondary | ICD-10-CM | POA: Insufficient documentation

## 2019-11-15 DIAGNOSIS — E785 Hyperlipidemia, unspecified: Secondary | ICD-10-CM | POA: Insufficient documentation

## 2019-11-15 DIAGNOSIS — M545 Low back pain, unspecified: Secondary | ICD-10-CM | POA: Insufficient documentation

## 2019-11-15 DIAGNOSIS — E782 Mixed hyperlipidemia: Secondary | ICD-10-CM | POA: Insufficient documentation

## 2019-11-19 DIAGNOSIS — R600 Localized edema: Secondary | ICD-10-CM | POA: Insufficient documentation

## 2019-11-20 ENCOUNTER — Ambulatory Visit: Payer: Medicare Other | Admitting: Orthotics

## 2019-11-20 ENCOUNTER — Ambulatory Visit: Payer: Medicare Other | Admitting: Family Medicine

## 2019-11-20 ENCOUNTER — Other Ambulatory Visit: Payer: Self-pay

## 2019-11-20 DIAGNOSIS — M2020 Hallux rigidus, unspecified foot: Secondary | ICD-10-CM

## 2019-11-20 DIAGNOSIS — M2042 Other hammer toe(s) (acquired), left foot: Secondary | ICD-10-CM

## 2019-11-20 DIAGNOSIS — M2041 Other hammer toe(s) (acquired), right foot: Secondary | ICD-10-CM

## 2019-11-20 DIAGNOSIS — E119 Type 2 diabetes mellitus without complications: Secondary | ICD-10-CM

## 2019-11-20 NOTE — Progress Notes (Signed)

## 2019-11-27 ENCOUNTER — Other Ambulatory Visit: Payer: Medicare Other | Admitting: Orthotics

## 2019-11-28 ENCOUNTER — Ambulatory Visit (INDEPENDENT_AMBULATORY_CARE_PROVIDER_SITE_OTHER): Payer: Medicare Other | Admitting: Pharmacist

## 2019-11-28 DIAGNOSIS — N184 Chronic kidney disease, stage 4 (severe): Secondary | ICD-10-CM

## 2019-11-28 DIAGNOSIS — Z794 Long term (current) use of insulin: Secondary | ICD-10-CM | POA: Diagnosis not present

## 2019-11-28 DIAGNOSIS — E1122 Type 2 diabetes mellitus with diabetic chronic kidney disease: Secondary | ICD-10-CM | POA: Diagnosis not present

## 2019-11-28 DIAGNOSIS — I10 Essential (primary) hypertension: Secondary | ICD-10-CM | POA: Diagnosis not present

## 2019-11-28 MED ORDER — AMLODIPINE BESYLATE 5 MG PO TABS: 5.0000 mg | ORAL_TABLET | Freq: Every day | ORAL | 3 refills | Status: DC

## 2019-11-28 MED ORDER — GLIPIZIDE 5 MG PO TABS: 2.5000 mg | ORAL_TABLET | Freq: Every day | ORAL | 2 refills | Status: DC

## 2019-11-28 NOTE — Chronic Care Management (AMB) (Signed)
Chronic Care Management   Follow Up Note   11/28/2019 Name: Kelly [name] MRN: [id number] DOB: 06/26/1940  Referred by: Kelly Haven, MD Reason for referral : Chronic Care Management (Medication Management)   Kelly [name] is a 79 y.o. year old female who is a primary care patient of Kelly [name], Kelly Adam, MD. The CCM team was consulted for assistance with chronic disease management and care coordination needs.   Contacted patient for medication management review.   Review of patient status, including review of consultants reports, relevant laboratory and other test results, and collaboration with appropriate care team members and the patient's provider was performed as part of comprehensive patient evaluation and provision of chronic care management services.    SDOH (Social Determinants of Health) assessments performed: No See Care Plan activities for detailed interventions related to [facility])     Outpatient Encounter Medications as of 11/28/2019  Medication Sig Note  . amLODipine (NORVASC) 5 MG tablet Take 1 tablet (5 mg total) by mouth daily.   Marland Kitchen aspirin EC 81 MG tablet Take 81 mg by mouth daily.   Marland Kitchen atorvastatin (LIPITOR) 80 MG tablet TAKE 1 TABLET DAILY   . calcitRIOL (ROCALTROL) 0.25 MCG capsule Take 0.25 mcg by mouth daily.   . Calcium Citrate-Vitamin D (CALCIUM CITRATE +D PO) Take 600 mg by mouth daily.   . Cholecalciferol (VITAMIN D) 50 MCG (2000 UT) CAPS Take 2,000 Units by mouth daily.    . cyclobenzaprine (FLEXERIL) 10 MG tablet TAKE 1/2 TABLET (5MG  TOTAL)3 TIMES A DAY AS NEEDED    FOR MUSCLE SPASMS 01/31/2019: Using ~1/week for leg cramps  . denosumab (PROLIA) 60 MG/ML SOSY injection Inject 60 mg into the skin every 6 (six) months. 09/24/2018: Last dose in April   . docusate sodium (COLACE) 100 MG capsule Take 100 mg by mouth 2 (two) times daily.   . furosemide (LASIX) 40 MG tablet Take 1 tablet (40 mg total) by mouth daily as needed. 11/06/2017: Takes very  sporadically PRN lower extremity edema  . gabapentin (NEURONTIN) 100 MG capsule Take 100 mg by mouth 2 (two) times daily. 12/06/2018: 1 daily   . glipiZIDE (GLUCOTROL) 5 MG tablet Take 0.5 tablets (2.5 mg total) by mouth daily with supper.   Marland Kitchen glucosamine-chondroitin 500-400 MG tablet Take 1 tablet by mouth 3 (three) times daily.   Marland Kitchen glucose blood (ACCU-CHEK AVIVA PLUS) test strip Use to check blood sugar up to 4 times daily   . HYDROcodone-acetaminophen (NORCO) 5-325 MG tablet Norco 5 mg-325 mg tablet  Take 1 tablet twice a day by oral route as needed. 01/03/2019: PRN, 0-1 daily  . Insulin Glargine (BASAGLAR KWIKPEN) 100 UNIT/ML INJECT 20 UNITS TOTAL      SUBCUTANEOUSLY DAILY   . Insulin Pen Needle (BD PEN NEEDLE NANO U/F) 32G X 4 MM MISC USE DAILY WITH VICTOZA AND BASAGLAR   . Lancets MISC Use up to 4 times daily to check blood sugars.   Marland Kitchen losartan (COZAAR) 50 MG tablet TAKE 1 TABLET DAILY   . Melatonin 5 MG CHEW Chew 5 mg by mouth daily.   Marland Kitchen PARoxetine (PAXIL-CR) 25 MG 24 hr tablet TAKE 1 TABLET DAILY   . potassium chloride (K-DUR) 10 MEQ tablet potassium chloride ER 10 mEq tablet,extended release 11/06/2017: Only takes when she takes furosemide  . VICTOZA 18 MG/3ML SOPN INJECT 0.3ML (1.8MG  TOTAL) SUBCUTANEOUSLY DAILY   . [DISCONTINUED] amLODipine (NORVASC) 5 MG tablet Take 1 tablet (5 mg total) by mouth daily. (Patient  taking differently: Take 5 mg by mouth at bedtime. )   . [DISCONTINUED] glipiZIDE (GLUCOTROL) 5 MG tablet Take 1 tablet (5 mg total) by mouth daily before breakfast.   . ALPRAZolam (XANAX) 0.5 MG tablet Take 0.5 tablets (0.25 mg total) by mouth 2 (two) times daily as needed for anxiety. (Patient not taking: Reported on 11/28/2019)   . [DISCONTINUED] ALPRAZolam (XANAX) 0.25 MG tablet Take 0.25 mg by mouth 2 (two) times daily as needed for anxiety. (Patient not taking: Reported on 11/28/2019)   . [DISCONTINUED] doxycycline (VIBRA-TABS) 100 MG tablet Take 1 tablet (100 mg total) by mouth  2 (two) times daily.   . [DISCONTINUED] naproxen (NAPROSYN) 375 MG tablet naproxen 375 mg tablet  Take 1 tablet twice a day by oral route with meals.    No facility-administered encounter medications on file as of 11/28/2019.     Objective:   Goals Addressed              This Visit's Progress     Patient Stated   .  "I want to work on my diabetes" (pt-stated)        CARE PLAN ENTRY (see longtitudinal plan of care for additional care plan information)  Current Barriers:  . Social, financial, and community barriers:  o Patient reports being very scared of her recent sugar elevation. She did not realize that the bilateral steroid injections would have such an impact on her sugars. Notes she never heard from the endocrinology referral placed, but now that her sugars are more normal, does not feel like she needs to see an endocrinologist o Also notes having more tremor/nervousness recently. Wonders if she should be taking more alprazolam . Diabetes: uncontrolled at goal <7.5%; most recent T6R 4.4%; complicated by CKD . Current antihyperglycemic regimen: Victoza 1.8 mg daily, Basaglar 20 units daily, glipizide 5 mg QAM . Current glucose readings:  Fasting After Morning Meal Before Supper After Supper Bedtime   1-Jul 131 183 170  253 Ate spaghetti  2-Jul 131 78 226   No lunch, so ate pimento cheese sandwich w/ pepsi after low reading  3-Jul 104  49 243 204 Ate lots of Kisses to correct low  4-Jul 205 156 98 172    5-Jul 133  69  276 Ate candy to correct low  6-Jul 153 227 88 256  Ihop for breakfast, steak and vegetables for dinner  7-Jul 98 110   178   8-Jul 267 258   140 ate popcorn before bed  9-Jul 122  66 186 213 ate tomato sandwich for lunch; broccoli, cheese, coke, 3 candy kisses  10-Jul 129 96 169  244   11-Jul 124  121 230 154   12-Jul 105  96     13-Jul 212  211   Tomato sandwich, then shrimp and baked potato  14-Jul 102  87 141    15-Jul 104  61 300  Ate M&M to correct  low   16-Jul 151  65  138   17-Jul 90  123  235 popcorn and pepsi after dinner  18-Jul 260    148   19-Jul 152    228 baked spaghetti  20-Jul 142 98 69 157    21-Jul 153  93  235   22-Jul        AVG 147 151 109 211 203    . Cardiovascular risk reduction: o Current hypertensive regimen: amlodipine 5 mg QPM (has been taking 2 2.5 mg tabs), losartan  50 mg QAM o Current hyperlipidemia regimen: atorvastatin 80 mg daily; LDL now at goal <70 . Peripheral Neuropathy: gabapentin 100 mg BID, though patient has not been taking. She notes her daughter told her to stop taking it to avoid getting sleepy. Patient endorses more concerns w/ nerve pain sensations recently.  . Supplements: calcitriol 0.25 mg daily, calcium + vitamin D daily   Pharmacist Clinical Goal(s):  Marland Kitchen Over the next 90 days, patient with work with PharmD and primary care provider to address optimized management of diabetes  Interventions: . Comprehensive medication review performed, medication list updated in electronic medical record . Inter-disciplinary care team collaboration (see longitudinal plan of care) . Reviewed rule of 15. Patient is having hypoglycemia, but then over treating and causing elevations. Additionally, post supper elevations are concerning. Supper is her largest, most consistent meal. Stop glipizide QAM, and instead start glipizide 2.5 mg QPM, about 30 minutes before supper.  . Patient will continue to check fasting, midday, and evening sugar readings. She is to STOP glipizide if she develops low blood sugars at bedtime . Discussed that dietary indiscretions are impacting sugar control. Discussed referral to Dietician for education. She is amenable to this. Will collaborate w/ PCP to place this when he sees her in office next week. Discussed that it would be good for her daughter to attend these appointments with her. . Discussed mood and "tremors". She does not seem to associate these episodes w/ hypoglycemic  episodes. Discussed that rather than taking more alprazolam, we should consider if paroxetine is adequately controlling mood anymore. Encouraged to discuss alternative SSRI therapy w/ PCP next week.   Patient Self Care Activities:  . Patient will check blood glucose daily, document, and provide at future appointments . Patient will check BP daily document, and provide at future appointments . Patient will take medications as prescribed . Patient will report any questions or concerns to provider   Please see past updates related to this goal by clicking on the "Past Updates" button in the selected goal          Plan:  - Scheduled f/u call in ~ 3 weeks  [name], PharmD, [place], [place] Pharmacist [place] [phone]

## 2019-11-28 NOTE — Patient Instructions (Signed)
Visit Information  Goals Addressed              This Visit's Progress     Patient Stated   .  "I want to work on my diabetes" (pt-stated)        CARE PLAN ENTRY (see longtitudinal plan of care for additional care plan information)  Current Barriers:  . Social, financial, and community barriers:  o Patient reports being very scared of her recent sugar elevation. She did not realize that the bilateral steroid injections would have such an impact on her sugars. Notes she never heard from the endocrinology referral placed, but now that her sugars are more normal, does not feel like she needs to see an endocrinologist o Also notes having more tremor/nervousness recently. Wonders if she should be taking more alprazolam . Diabetes: uncontrolled at goal <7.5%; most recent V3X 1.0%; complicated by CKD . Current antihyperglycemic regimen: Victoza 1.8 mg daily, Basaglar 20 units daily, glipizide 5 mg QAM . Current glucose readings:  Fasting After Morning Meal Before Supper After Supper Bedtime   1-Jul 131 183 170  253 Ate spaghetti  2-Jul 131 78 226   No lunch, so ate pimento cheese sandwich w/ pepsi after low reading  3-Jul 104  49 243 204 Ate lots of Kisses to correct low  4-Jul 205 156 98 172    5-Jul 133  69  276 Ate candy to correct low  6-Jul 153 227 88 256  Ihop for breakfast, steak and vegetables for dinner  7-Jul 98 110   178   8-Jul 267 258   140 ate popcorn before bed  9-Jul 122  66 186 213 ate tomato sandwich for lunch; broccoli, cheese, coke, 3 candy kisses  10-Jul 129 96 169  244   11-Jul 124  121 230 154   12-Jul 105  96     13-Jul 212  211   Tomato sandwich, then shrimp and baked potato  14-Jul 102  87 141    15-Jul 104  61 300  Ate M&M to correct low   16-Jul 151  65  138   17-Jul 90  123  235 popcorn and pepsi after dinner  18-Jul 260    148   19-Jul 152    228 baked spaghetti  20-Jul 142 98 69 157    21-Jul 153  93  235   22-Jul        AVG 147 151 109 211 203     . Cardiovascular risk reduction: o Current hypertensive regimen: amlodipine 5 mg QPM (has been taking 2 2.5 mg tabs), losartan 50 mg QAM o Current hyperlipidemia regimen: atorvastatin 80 mg daily; LDL now at goal <70 . Peripheral Neuropathy: gabapentin 100 mg BID, though patient has not been taking. She notes her daughter told her to stop taking it to avoid getting sleepy. Patient endorses more concerns w/ nerve pain sensations recently.  . Supplements: calcitriol 0.25 mg daily, calcium + vitamin D daily   Pharmacist Clinical Goal(s):  Marland Kitchen Over the next 90 days, patient with work with PharmD and primary care provider to address optimized management of diabetes  Interventions: . Comprehensive medication review performed, medication list updated in electronic medical record . Inter-disciplinary care team collaboration (see longitudinal plan of care) . Reviewed rule of 15. Patient is having hypoglycemia, but then over treating and causing elevations. Additionally, post supper elevations are concerning. Supper is her largest, most consistent meal. Stop glipizide QAM, and instead start glipizide  2.5 mg QPM, about 30 minutes before supper.  . Patient will continue to check fasting, midday, and evening sugar readings. She is to STOP glipizide if she develops low blood sugars at bedtime . Discussed that dietary indiscretions are impacting sugar control. Discussed referral to Dietician for education. She is amenable to this. Will collaborate w/ PCP to place this when he sees her in office next week. Discussed that it would be good for her daughter to attend these appointments with her. . Discussed mood and "tremors". She does not seem to associate these episodes w/ hypoglycemic episodes. Discussed that rather than taking more alprazolam, we should consider if paroxetine is adequately controlling mood anymore. Encouraged to discuss alternative SSRI therapy w/ PCP next week.   Patient Self Care Activities:   . Patient will check blood glucose daily, document, and provide at future appointments . Patient will check BP daily document, and provide at future appointments . Patient will take medications as prescribed . Patient will report any questions or concerns to provider   Please see past updates related to this goal by clicking on the "Past Updates" button in the selected goal         The patient verbalized understanding of instructions provided today and declined a print copy of patient instruction materials.    Plan:  - Scheduled f/u call in ~ 3 weeks  [name], PharmD, [place], [place] Pharmacist [place] [phone]

## 2019-11-29 ENCOUNTER — Ambulatory Visit: Payer: Medicare Other | Admitting: Family Medicine

## 2019-12-02 ENCOUNTER — Ambulatory Visit (INDEPENDENT_AMBULATORY_CARE_PROVIDER_SITE_OTHER): Payer: Medicare Other | Admitting: Family Medicine

## 2019-12-02 ENCOUNTER — Encounter: Payer: Self-pay | Admitting: Family Medicine

## 2019-12-02 ENCOUNTER — Other Ambulatory Visit: Payer: Self-pay

## 2019-12-02 DIAGNOSIS — N184 Chronic kidney disease, stage 4 (severe): Secondary | ICD-10-CM

## 2019-12-02 DIAGNOSIS — F32A Depression, unspecified: Secondary | ICD-10-CM

## 2019-12-02 DIAGNOSIS — G250 Essential tremor: Secondary | ICD-10-CM | POA: Diagnosis not present

## 2019-12-02 DIAGNOSIS — B029 Zoster without complications: Secondary | ICD-10-CM | POA: Diagnosis not present

## 2019-12-02 DIAGNOSIS — F419 Anxiety disorder, unspecified: Secondary | ICD-10-CM | POA: Diagnosis not present

## 2019-12-02 DIAGNOSIS — Z794 Long term (current) use of insulin: Secondary | ICD-10-CM | POA: Diagnosis not present

## 2019-12-02 DIAGNOSIS — E1122 Type 2 diabetes mellitus with diabetic chronic kidney disease: Secondary | ICD-10-CM

## 2019-12-02 DIAGNOSIS — F329 Major depressive disorder, single episode, unspecified: Secondary | ICD-10-CM | POA: Diagnosis not present

## 2019-12-02 MED ORDER — VALACYCLOVIR HCL 1 G PO TABS
1000.0000 mg | ORAL_TABLET | Freq: Every day | ORAL | 0 refills | Status: DC
Start: 2019-12-02 — End: 2020-01-01

## 2019-12-02 NOTE — Progress Notes (Signed)
Virtual Visit via telephone Note  This visit type was conducted due to national recommendations for restrictions regarding the COVID-19 pandemic (e.g. social distancing).  This format is felt to be most appropriate for this patient at this time.  All issues noted in this document were discussed and addressed.  No physical exam was performed (except for noted visual exam findings with Video Visits).   I connected with Kelly [name] today at  2:45 PM EDT by telephone and verified that I am speaking with the correct person using two identifiers. Location patient: car  Location provider: work Persons participating in the virtual visit: patient, provider, Chaz Ronning  I discussed the limitations, risks, security and privacy concerns of performing an evaluation and management service by telephone and the availability of in person appointments. I also discussed with the patient that there may be a patient responsible charge related to this service. The patient expressed understanding and agreed to proceed.  Interactive audio and video telecommunications were attempted between this provider and patient, however failed, due to patient having technical difficulties OR patient did not have access to video capability.  We continued and completed visit with audio only.   Reason for visit: f/u.  HPI: Diabetes: Taking Victoza, Basaglar, and glipizide.  She is on half of glipizide at night.  Her 90-day average has been 187 with a low of 49 and a high of greater than 600.  Based on her daughter's report it sounds as though her hypoglycemic episodes are occurring when she waits to take her Basaglar until later in the day and also takes her glipizide close to the time she takes her Engineer, agricultural.  Most of the time she does not feel her low sugars.  Her elevated sugars seem to be related to steroid injection she got on July 9.  She has been eating lots of tomato sandwiches and drinking lots of sodas.  She  typically eats 2 meals a day.  Essential tremor: They note she has some hand tremors that typically occur when she is holding onto something.  Rarely has a resting tremor.  Rash: They wonder if she has shingles on her left breast.  She has had erythematous papules that were preceded by pain.  The rash started the morning of this visit.  She has a spot in a similar location on her back.  Notes the pain is 5 out of 10.  She has Norco at home that she can take for pain.  The patient was briefly in the office at the time of her visit and I was able to view the rash in the presence of Fulton Mole, CMA with me standing outside of the room.  The rash appeared to be consistent with shingles.  We advised the patient and her daughter that given that I did not pass my fit testing for a N95 mask I could not see the patient in person based on [place]'s airborne precaution guidelines. The visit was shifted to a phone visit.   Anxiety/depression: Patient notes her depression is stable.  Her anxiety is worse.  She is worried about her kidney function.  She rarely takes Xanax.  She is currently on Paxil.  No SI.   ROS: See pertinent positives and negatives per HPI.  Past Medical History:  Diagnosis Date  . Colon polyps   . Depression   . DM (diabetes mellitus), type 2 with renal complications ([place])   . Hyperlipidemia   . Hypertension   . Kidney  stones   . Sleep apnea   . 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     Past Surgical History:  Procedure Laterality Date  . APPENDECTOMY    . BREAST BIOPSY Right 06/19/2018   affirm stereo/x clip/COLUMNAR CELL CHANGE WITH MICROCALCIFICATIONS. FIBROCYSTIC CHANGES WITH MICROCALCIFICATIONS  . cataract  03/2017  . CHOLECYSTECTOMY    . COLONOSCOPY    . COLONOSCOPY WITH PROPOFOL N/A 08/22/2017   Procedure: COLONOSCOPY WITH PROPOFOL;  Surgeon: Jonathon Bellows, MD;  [facility] END[facility]COPY;  Service: Gastroenterology;  Laterality: N/A;  .  ESOPHAGOGASTRODUODENOSCOPY    . ESOPHAGOGASTRODUODENOSCOPY (EGD) WITH PROPOFOL N/A 03/23/2016   Procedure: ESOPHAGOGASTRODUODENOSCOPY (EGD) WITH PROPOFOL;  Surgeon: Manya Silvas, MD;  Location: [facility] ENDOSCOPY;  Service: Endoscopy;  Laterality: N/A;  . FLEXIBLE SIGMOIDOSCOPY N/A 02/06/2018   Procedure: FLEXIBLE SIGMOIDOSCOPY;  Surgeon: Jonathon Bellows, MD;  Location[facility] ENDOSCOPY;  Service: Gastroenterology;  Laterality: N/A;  . FLEXIBLE SIGMOIDOSCOPY N/A 12/03/2018   Procedure: FLEXIBLE SIGMOIDOSCOPY;  Surgeon: Jonathon Bellows, MD;  Location: [facility] ENDOSCOPY;  Service: Gastroenterology;  Laterality: N/A;  . RECTAL PROLAPSE REPAIR  2012   x2    Family History  Problem Relation Age of Onset  . Stroke Mother   . Arthritis Mother   . Aortic aneurysm Sister   . Lung cancer Sister   . Heart attack Father 17  . Breast cancer Sister 46    SOCIAL HX: nonsmoker   Current Outpatient Medications:  .  amLODipine (NORVASC) 5 MG tablet, Take 1 tablet (5 mg total) by mouth daily., Disp: 90 tablet, Rfl: 3 .  aspirin EC 81 MG tablet, Take 81 mg by mouth daily., Disp: , Rfl:  .  atorvastatin (LIPITOR) 80 MG tablet, TAKE 1 TABLET DAILY, Disp: 90 tablet, Rfl: 1 .  calcitRIOL (ROCALTROL) 0.25 MCG capsule, Take 0.25 mcg by mouth daily., Disp: , Rfl:  .  Calcium Citrate-Vitamin D (CALCIUM CITRATE +D PO), Take 600 mg by mouth daily., Disp: , Rfl:  .  Cholecalciferol (VITAMIN D) 50 MCG (2000 UT) CAPS, Take 2,000 Units by mouth daily. , Disp: , Rfl:  .  cyclobenzaprine (FLEXERIL) 10 MG tablet, TAKE 1/2 TABLET (5MG  TOTAL)3 TIMES A DAY AS NEEDED    FOR MUSCLE SPASMS, Disp: 45 tablet, Rfl: 0 .  denosumab (PROLIA) 60 MG/ML SOSY injection, Inject 60 mg into the skin every 6 (six) months., Disp: , Rfl:  .  docusate sodium (COLACE) 100 MG capsule, Take 100 mg by mouth 2 (two) times daily., Disp: , Rfl:  .  furosemide (LASIX) 40 MG tablet, Take 1 tablet (40 mg total) by mouth daily as needed., Disp: 90 tablet, Rfl: 0 .   gabapentin (NEURONTIN) 100 MG capsule, Take 100 mg by mouth 2 (two) times daily., Disp: , Rfl:  .  glipiZIDE (GLUCOTROL) 5 MG tablet, Take 0.5 tablets (2.5 mg total) by mouth daily with supper., Disp: 15 tablet, Rfl: 2 .  glucosamine-chondroitin 500-400 MG tablet, Take 1 tablet by mouth 3 (three) times daily., Disp: , Rfl:  .  glucose blood (ACCU-CHEK AVIVA PLUS) test strip, Use to check blood sugar up to 4 times daily, Disp: 100 each, Rfl: 3 .  HYDROcodone-acetaminophen (NORCO) 5-325 MG tablet, Norco 5 mg-325 mg tablet  Take 1 tablet twice a day by oral route as needed., Disp: , Rfl:  .  Insulin Glargine (BASAGLAR KWIKPEN) 100 UNIT/ML, INJECT 20 UNITS TOTAL      SUBCUTANEOUSLY DAILY, Disp: 15 mL, Rfl: 0 .  Insulin Pen Needle (BD PEN NEEDLE NANO U/F) 32G X 4 MM MISC, USE DAILY WITH VICTOZA AND BASAGLAR, Disp: 180 each, Rfl: 2 .  Lancets MISC, Use up to 4 times daily to check blood sugars., Disp: 200 each, Rfl: 11 .  losartan (COZAAR) 50 MG tablet, TAKE 1 TABLET DAILY, Disp: 90 tablet, Rfl: 1 .  Melatonin 5 MG CHEW, Chew 5 mg by mouth daily., Disp: , Rfl:  .  PARoxetine (PAXIL-CR) 25 MG 24 hr tablet, TAKE 1 TABLET DAILY, Disp: 90 tablet, Rfl: 3 .  potassium chloride (K-DUR) 10 MEQ tablet, potassium chloride ER 10 mEq tablet,extended release, Disp: , Rfl:  .  VICTOZA 18 MG/3ML SOPN, INJECT 0.3ML (1.8MG  TOTAL) SUBCUTANEOUSLY DAILY, Disp: 27 mL, Rfl: 3 .  valACYclovir (VALTREX) 1000 MG tablet, Take 1 tablet (1,000 mg total) by mouth daily., Disp: 20 tablet, Rfl: 0  EXAM: This is a telephone visit and thus no physical exam was completed.  ASSESSMENT AND PLAN:  Discussed the following assessment and plan:  DM (diabetes mellitus), type 2 with renal complications ([place]) Uncontrolled.  She has lots of dietary indiscretions that are likely contributing to her elevated sugars.  She is also not taking her Basaglar and glipizide correctly.  I discussed that she needs to take the Basaglar in the morning and  take the glipizide in the evening with a meal to limit the risk of hypoglycemia.  She needs to make sure she eats adequate meals with those.  We will refer her to nutrition for them to get some counseling on her diet.  Essential tremor Chronic.  Relatively stable with may be some mild increase.  Discussed monitoring and the potential for neurology evaluation in the future and the potential for medication though she defers that at this time.  Anxiety and depression This has worsened.  We will check with our clinical pharmacist regarding a tapering regimen for her Paxil to get her on the Lexapro.  Shingles Rash and pain are concerning for shingles.  We will treat with Valtrex that has been renally dosed.  She has hydrocodone that she can take for pain if needed.  Advised it could take a week or 2 for the rash to resolve.  Discussed that the pain could persist though could go away.  She will monitor.  Advised to avoid anyone who has not had chickenpox, is immunocompromise, or is pregnant.  Advised to keep the rash covered.   Orders Placed This Encounter  Procedures  . Ambulatory referral to diabetic education    Referral Priority:   Routine    Referral Type:   Consultation    Referral Reason:   Specialty Services Required    Number of Visits Requested:   1    Meds ordered this encounter  Medications  . valACYclovir (VALTREX) 1000 MG tablet    Sig: Take 1 tablet (1,000 mg total) by mouth daily.    Dispense:  20 tablet    Refill:  0     I discussed the assessment and treatment plan with the patient. The patient was provided an opportunity to ask questions and all were answered. The patient agreed with the plan and demonstrated an understanding of the instructions.   The patient was advised to call back or seek an in-person evaluation if the symptoms worsen or if the condition fails to improve as anticipated.  I provided 26 minutes of non-face-to-face time during this encounter.   Tommi Rumps, MD

## 2019-12-05 ENCOUNTER — Telehealth: Payer: Self-pay | Admitting: Family Medicine

## 2019-12-05 DIAGNOSIS — B029 Zoster without complications: Secondary | ICD-10-CM | POA: Insufficient documentation

## 2019-12-05 HISTORY — DX: Zoster without complications: B02.9

## 2019-12-05 MED ORDER — ESCITALOPRAM OXALATE 10 MG PO TABS
ORAL_TABLET | ORAL | 1 refills | Status: DC
Start: 2019-12-05 — End: 2020-04-29

## 2019-12-05 MED ORDER — PAROXETINE HCL ER 12.5 MG PO TB24: 12.5000 mg | ORAL_TABLET | Freq: Every day | ORAL | 0 refills | Status: DC

## 2019-12-05 NOTE — Assessment & Plan Note (Signed)
Uncontrolled.  She has lots of dietary indiscretions that are likely contributing to her elevated sugars.  She is also not taking her Basaglar and glipizide correctly.  I discussed that she needs to take the Basaglar in the morning and take the glipizide in the evening with a meal to limit the risk of hypoglycemia.  She needs to make sure she eats adequate meals with those.  We will refer her to nutrition for them to get some counseling on her diet.

## 2019-12-05 NOTE — Telephone Encounter (Signed)
-----   Message from [name], [facility] sent at 12/05/2019 12:55 PM EDT ----- Oh good! Since CR, it won't be as bad as if we were tapering IR paroxetine.   Would send paroxetine CR 12.5 mg daily x2 weeks. Have her start escitalopram 5 mg daily x 2 weeks, then increase to 10 mg daily and d/c paroxetine  Catie ----- Message ----- From: [name], MD Sent: 12/05/2019  11:48 AM EDT To: [name], [facility]  Hey Catie,   I want to transition this patient from paxil to lexapro. How long of a taper would we need to do for the paxil prior to starting on the lexapro? Thanks.  Randall Hiss

## 2019-12-05 NOTE — Assessment & Plan Note (Signed)
Chronic.  Relatively stable with may be some mild increase.  Discussed monitoring and the potential for neurology evaluation in the future and the potential for medication though she defers that at this time.

## 2019-12-05 NOTE — Telephone Encounter (Signed)
I called and spoke with the patient and informed her of how the provider is transitioning her from paxil to lexapro, the patient wrote down the instructions and I informed her that both medication was sent to pharmacy.  Jeziel Hoffmann,cma

## 2019-12-05 NOTE — Assessment & Plan Note (Signed)
Rash and pain are concerning for shingles.  We will treat with Valtrex that has been renally dosed.  She has hydrocodone that she can take for pain if needed.  Advised it could take a week or 2 for the rash to resolve.  Discussed that the pain could persist though could go away.  She will monitor.  Advised to avoid anyone who has not had chickenpox, is immunocompromise, or is pregnant.  Advised to keep the rash covered.

## 2019-12-05 NOTE — Assessment & Plan Note (Signed)
This has worsened.  We will check with our clinical pharmacist regarding a tapering regimen for her Paxil to get her on the Lexapro.

## 2019-12-05 NOTE — Telephone Encounter (Signed)
Please let the patient know that I heard from Catie on her paxil transitioning to lexapro. The patient will take paxil CR 12.5 mg daily for 2 weeks. At the same time she will start lexapro 5 mg daily for 2 weeks, then increase lexapro to 10 mg daily and stop paxil. I sent the lexapro and the lower dose of paxil to the pharmacy.

## 2019-12-09 ENCOUNTER — Telehealth: Payer: Self-pay

## 2019-12-09 NOTE — Telephone Encounter (Signed)
12/09/19 Spoke with patient about Med Alert offered through [facility] [phone]. Ambrose Mantle [phone]

## 2019-12-16 NOTE — Telephone Encounter (Signed)
12/16/19 Left message on home voicemail for patient to return my call regarding medical aleryt system. Unable to leave message on cell voicemail full.  Will attempt to contact patient later this week. Kelly [name] [phone]

## 2019-12-16 NOTE — Telephone Encounter (Signed)
12/16/19 Spoke with patient she is planning on getting the [place] offered through the [facility] which cost $21 a month.  No other resources are needed at this time. Ambrose Mantle [phone]

## 2019-12-17 ENCOUNTER — Encounter: Payer: Medicare Other | Attending: Family Medicine | Admitting: Dietician

## 2019-12-17 ENCOUNTER — Other Ambulatory Visit: Payer: Self-pay

## 2019-12-17 ENCOUNTER — Encounter: Payer: Self-pay | Admitting: Dietician

## 2019-12-17 VITALS — Ht 63.0 in | Wt 140.4 lb

## 2019-12-17 DIAGNOSIS — N184 Chronic kidney disease, stage 4 (severe): Secondary | ICD-10-CM | POA: Diagnosis not present

## 2019-12-17 DIAGNOSIS — Z794 Long term (current) use of insulin: Secondary | ICD-10-CM | POA: Insufficient documentation

## 2019-12-17 DIAGNOSIS — E1122 Type 2 diabetes mellitus with diabetic chronic kidney disease: Secondary | ICD-10-CM | POA: Insufficient documentation

## 2019-12-17 NOTE — Patient Instructions (Signed)
   Switch to a whole wheat bread for sandwiches   Avoid dark colas   Limit sodium intake to 1500 mg/day (500mg /meal)   Portion control carb intake   2-3 servings/meals   1-2 servings/snacks   Read labels for   Saturated fat <5g  Trans fat 0g  Sodium 500mg    Net Carbs (total carbs-fiber) about 45g  Added sugar <10g

## 2019-12-17 NOTE — Progress Notes (Signed)
Medical Nutrition Therapy: Visit start time: 1330  end time: 1500  Assessment:  Diagnosis: type 2 diabetes Past medical history: sleep apnea Psychosocial issues/ stress concerns: pt rates stress as "moderate" and feels "ok" about self stress management   Preferred learning method:  . Auditory . Visual . Hands-on  Current weight: 140.4 lbs  Height: 5'3" Medications, supplements: reconciled in medical record  Labs- A1c 7.6 (H) 08/07/2019, GFR 24.36 (L), Creatinine 1.98 (H), BUN 30(H)  Progress and evaluation:   Pt states feeling confused about what foods to eat that are ok for diabetes and CKD  Pt states her daughter lives nearby and helps with groceries and planning meals   Pt states she enjoys fruit but has heard that fruit may not be good for her    Pt states she is most interest in learning what foods to eat   Pt reports she worked with dietitian at this location several years ago and found the visit helpful, but needs refresher  Physical activity: walking 30 min, 6 days/week  Dietary Intake:  Usual eating pattern includes 3 meals and 1 snacks per day. Dining out frequency: 1 meals per week.  Breakfast: bowl of cereal (special k with whole milk), PB and jelly sandwich with apple juice  Lunch: sandwich with sliced turkey/ham/pimento cheese/chicken salad; fish sandwich or burger when out; [employer] chicken pot pie; [place] pasta   Supper: frozen fish dinners Snack: small bag of butter popcorn  Beverages: apple juice, 3-4 cups water, 2 cans of coke twice a week    Diet high in added sugar and possibly high in sodium.  Diet needs to include more whole fruits and vegetables as well as whole grains.  Nutrition Care Education: Basic nutrition: basic food groups, appropriate nutrient balance, appropriate meal and snack schedule, general nutrition guidelines    Weight control: importance of low sugar and low fat choices, portion control strategies Advanced nutrition:  recipe  modification, cooking techniques, dining out, food label reading Diabetes:  goals for BGs, appropriate meal and snack schedule, appropriate carb intake and balance, healthy carb choices, role of fiber, protein, fat CKD:  importance of controlling BP, moderate protein intake, identifying high sodium foods, identifying foods high in potassium and phosphorus, importance of electrolyte balance, fluid intake   Nutritional Diagnosis:  NB-1.1 Food and nutrition-related knowledge deficit As related to diabetes and CKD sef management.  As evidenced by pt report of confusion about which foods to eat, partial understanding of portion control. [place]-2.2 Altered nutrition-related laboratory As related to diabetes.  As evidenced by A1c 7.6 on 08/07/2019.  Intervention:  Discussed the most important foods to limit with regards to CKD.  Reviewed that dietary management of diabetes may help preserve some renal function by putting less stress on the kidneys.  Pt prefers ease of frozen meals.  Reviewed in detail how to read label and ingredients to determine which frozen meals work with her nutrition goals.    Increase fruit and vegetable intake   Incorporate vegetables at lunch and dinner   Incorporate more F/V at snacks  Include whole fruit at breakfast instead of juice   Decrease sugar-sweetened beverage consumption   Avoid fruit juice  Switch from regular to diet soda  Drink at least 64oz water daily (add crystal light, lemon, or mio as needed)    Increase fiber intake   Eat at least 3 servings of whole grains a day  Increase fruit and vegetable intake  Decrease sodium intake   Reduce sodium  intake to recommended 1500mg /day   Read nutrition labels on packaged foods to monitor sodium intake   400-600 mg/meal  <200 mg/snack   Decrease fast food frequency   Education Materials given:  . General diet guidelines for Diabetes . NCM CKD Medical Nutrition Therapy . Plate Planner with food  lists . Goals/ instructions  Learner/ who was taught:  . Patient   Level of understanding: Marland Kitchen Verbalizes/ demonstrates competency  Demonstrated degree of understanding via:   Teach back Learning barriers: . None  Willingness to learn/ readiness for change: . Eager, change in progress  Monitoring and Evaluation:  Dietary intake, exercise, BG and A1c, GFR, and body weight      follow up: prn

## 2019-12-18 ENCOUNTER — Other Ambulatory Visit: Payer: Self-pay | Admitting: Family Medicine

## 2019-12-18 ENCOUNTER — Ambulatory Visit (INDEPENDENT_AMBULATORY_CARE_PROVIDER_SITE_OTHER): Payer: Medicare Other | Admitting: Pharmacist

## 2019-12-18 DIAGNOSIS — Z794 Long term (current) use of insulin: Secondary | ICD-10-CM

## 2019-12-18 DIAGNOSIS — N184 Chronic kidney disease, stage 4 (severe): Secondary | ICD-10-CM

## 2019-12-18 DIAGNOSIS — E1122 Type 2 diabetes mellitus with diabetic chronic kidney disease: Secondary | ICD-10-CM

## 2019-12-18 NOTE — Chronic Care Management (AMB) (Signed)
Chronic Care Management   Follow Up Note   12/18/2019 Name: Kelly [name] MRN: [id number] DOB: 25-Aug-1940  Referred by: [name], MD Reason for referral : Chronic Care Management (Medication Management)   Kelly [name] is a 79 y.o. year old female who is a primary care patient of Caryl Bis, Angela Adam, MD. The CCM team was consulted for assistance with chronic disease management and care coordination needs.    Contacted patient for medication management review.  Review of patient status, including review of consultants reports, relevant laboratory and other test results, and collaboration with appropriate care team members and the patient's provider was performed as part of comprehensive patient evaluation and provision of chronic care management services.    SDOH (Social Determinants of Health) assessments performed: No See Care Plan activities for detailed interventions related to [facility])     Outpatient Encounter Medications as of 12/18/2019  Medication Sig Note  . amLODipine (NORVASC) 5 MG tablet Take 1 tablet (5 mg total) by mouth daily.   Marland Kitchen aspirin EC 81 MG tablet Take 81 mg by mouth daily.   Marland Kitchen atorvastatin (LIPITOR) 80 MG tablet TAKE 1 TABLET DAILY   . calcitRIOL (ROCALTROL) 0.25 MCG capsule Take 0.25 mcg by mouth daily.   . Cholecalciferol (VITAMIN D) 50 MCG (2000 UT) CAPS Take 2,000 Units by mouth daily.    . cyclobenzaprine (FLEXERIL) 10 MG tablet TAKE 1/2 TABLET (5MG  TOTAL)3 TIMES A DAY AS NEEDED    FOR MUSCLE SPASMS 01/31/2019: Using ~1/week for leg cramps  . denosumab (PROLIA) 60 MG/ML SOSY injection Inject 60 mg into the skin every 6 (six) months. 09/24/2018: Last dose in April   . docusate sodium (COLACE) 100 MG capsule Take 100 mg by mouth daily.    Marland Kitchen escitalopram (LEXAPRO) 10 MG tablet Take 0.5 tablets (5 mg total) by mouth daily for 14 days, THEN 1 tablet (10 mg total) daily.   . furosemide (LASIX) 40 MG tablet Take 1 tablet (40 mg total) by mouth  daily as needed. 12/17/2019: As needed  . gabapentin (NEURONTIN) 100 MG capsule Take 100 mg by mouth 2 (two) times daily. 12/18/2019: QPM  . glipiZIDE (GLUCOTROL) 5 MG tablet Take 0.5 tablets (2.5 mg total) by mouth daily with supper.   Marland Kitchen glucosamine-chondroitin 500-400 MG tablet Take 1 tablet by mouth 3 (three) times daily.   Marland Kitchen glucose blood (ACCU-CHEK AVIVA PLUS) test strip Use to check blood sugar up to 4 times daily   . HYDROcodone-acetaminophen (NORCO) 5-325 MG tablet Norco 5 mg-325 mg tablet  Take 1 tablet twice a day by oral route as needed. 12/18/2019: Never takes more than 1 per day; takes ~3 times per week  . Insulin Glargine (BASAGLAR KWIKPEN) 100 UNIT/ML INJECT 20 UNITS TOTAL      SUBCUTANEOUSLY DAILY   . Insulin Pen Needle (BD PEN NEEDLE NANO U/F) 32G X 4 MM MISC USE DAILY WITH VICTOZA AND BASAGLAR   . Lancets MISC Use up to 4 times daily to check blood sugars.   Marland Kitchen losartan (COZAAR) 50 MG tablet TAKE 1 TABLET DAILY   . Melatonin 5 MG CHEW Chew 5 mg by mouth daily.   Marland Kitchen PARoxetine (PAXIL-CR) 12.5 MG 24 hr tablet Take 1 tablet (12.5 mg total) by mouth daily.   . potassium chloride (K-DUR) 10 MEQ tablet potassium chloride ER 10 mEq tablet,extended release 11/06/2017: Only takes when she takes furosemide  . valACYclovir (VALTREX) 1000 MG tablet Take 1 tablet (1,000 mg total) by mouth  daily.   Marland Kitchen VICTOZA 18 MG/3ML SOPN INJECT 0.3ML (1.8MG  TOTAL) SUBCUTANEOUSLY DAILY   . Calcium Citrate-Vitamin D (CALCIUM CITRATE +D PO) Take 600 mg by mouth daily. (Patient not taking: Reported on 12/18/2019)    No facility-administered encounter medications on file as of 12/18/2019.     Objective:   Goals Addressed              This Visit's Progress     Patient Stated   .  "I want to work on my diabetes" (pt-stated)        CARE PLAN ENTRY (see longtitudinal plan of care for additional care plan information)  Current Barriers:  . Social, financial, and community barriers:  o Reports enjoying her  visit with dietician yesterday. Notes that she took many notes about what she should and shouldn't be eating. Verbalizes that she needs to stop drinking pepsi . Diabetes: uncontrolled at goal <7.5%; most recent Y1E 5.6%; complicated by CKD . Current antihyperglycemic regimen: Victoza 1.8 mg daily, Basaglar 20 units daily, glipizide 2.5-5 mg QPM, though mostly 5 mg. Taking after supper . Current glucose readings:  Fasting Before Lunch After lunch Before Supper After Supper Bedtime   23-Jul 151 130  128  319   24-Jul 134 112       25-Jul 203   109  262 Fruit cake, pepsi  26-Jul 94    267    27-Jul 73 105  73  219   28-Jul 90 96   237 209   29-Jul 139 129 283   325 fish dinner  30-Jul 120  144 82  267   31-Jul 99 96       1-Aug 167 63 106   197   2-Aug 148 118 296   319 Pepsi  3-Aug 94   169  168   4-Aug 73  173 120 94 129   5-Aug 68  209 131  135   6-Aug 130    207 216   7-Aug 117 104   150 335 Pepsi  8-Aug 108 101  125 206    9-Aug 95 80    150   10-Aug 131 96  97  141   11-Aug 92 99        116 102 202 115 194 226    . Cardiovascular risk reduction: o Current hypertensive regimen: amlodipine 5 mg QPM, losartan 50 mg QAM o Current hyperlipidemia regimen: atorvastatin 80 mg daily; LDL now at goal <70 . Peripheral Neuropathy: gabapentin 100 mg QPM . Osteoporosis: Prolia Q6M from [facility]; last dose April. However, reports that [facility] is no longer offering the medication and they are referring her to someone at [facility]. Notes that she hasn't heard anything about scheduling. Wonders if she can get the injection here. Vitamin D 2000 units daily, needs to pick up a new bottle of calcium supplement . Supplements: calcitriol 0.25 mg daily, vitamin D daily - needs to pick up a new bottle of calcium supplement  Pharmacist Clinical Goal(s):  Marland Kitchen Over the next 90 days, patient with work with PharmD and primary care provider to address optimized management of  diabetes  Interventions: . Comprehensive medication review performed, medication list updated in electronic medical record . Inter-disciplinary care team collaboration (see longitudinal plan of care) . Reviewed appropriate sulfonylurea administration. Patient will start taking glipizide 5 mg before supper. Continue Basaglar 20 units daily and Victoza 1.8 mg daily. Worry about use of mealtime insulin and risk of  hypoglycemia, especially as patient's diet is variable. Reviewed that she needs to stop sugary sodas and monitor carbohydrate intake as discussed at dietary visit yesterday . Will collaborate w/ PCP and clinic staff to see if we can administer Prolia . Discussed use of FreeStyle Libre for more consistent review of glucoses and feedback on dietary choices. Patient is not injecting insulin 3+ times daily, so unlikely to qualify for insurance coverage. However, would be helpful to have patient wear a sample CGM for 2 weeks to evaluate glucose control. She notes she may be able to pay the out of pocket cost of $75/month w/o insurance.   Patient Self Care Activities:  . Patient will check blood glucose daily, document, and provide at future appointments . Patient will check BP daily document, and provide at future appointments . Patient will take medications as prescribed . Patient will report any questions or concerns to provider   Please see past updates related to this goal by clicking on the "Past Updates" button in the selected goal         Plan: - Scheduled face to face in ~2 weeks to for CGM sample placement  [name], PharmD, [place], [place] Pharmacist [place] [phone]

## 2019-12-18 NOTE — Progress Notes (Signed)
I have reviewed the above note and agree. I was available to the pharmacist for consultation. We should be able to give prolia in the office. She may need to get approved for this, though I am unsure. I will forward to [facility] to determine the process for this.  Tommi Rumps, MD

## 2019-12-18 NOTE — Patient Instructions (Addendum)
Kelly [name],   It was great to talk with you today!  1. Take your glipizide 10-15 minutes before supper. This way, it best matches up with your post-supper sugar elevation.   2. See below for our face to face appointment to place the FreeStyle [place] sample sensor.   3. Cut out the sodas! Use the skills you learned with the dietician to focus on better control of your sugar after supper. Our goal 2 hour post-meal sugar is <180. If you sugar is greater than 180, you need to think about the carbohydrate content of that meal and how you could cut down on the serving size next time  See you in 2 weeks!  [name], PharmD  Visit Information  Goals Addressed              This Visit's Progress     Patient Stated   .  "I want to work on my diabetes" (pt-stated)        CARE PLAN ENTRY (see longtitudinal plan of care for additional care plan information)  Current Barriers:  . Social, financial, and community barriers:  o Reports enjoying her visit with dietician yesterday. Notes that she took many notes about what she should and shouldn't be eating. Verbalizes that she needs to stop drinking pepsi . Diabetes: uncontrolled at goal <7.5%; most recent S9H 7.3%; complicated by CKD . Current antihyperglycemic regimen: Victoza 1.8 mg daily, Basaglar 20 units daily, glipizide 2.5-5 mg QPM, though mostly 5 mg. Taking after supper . Current glucose readings:  Fasting Before Lunch After lunch Before Supper After Supper Bedtime   23-Jul 151 130  128  319   24-Jul 134 112       25-Jul 203   109  262 Fruit cake, pepsi  26-Jul 94    267    27-Jul 73 105  73  219   28-Jul 90 96   237 209   29-Jul 139 129 283   325 fish dinner  30-Jul 120  144 82  267   31-Jul 99 96       1-Aug 167 63 106   197   2-Aug 148 118 296   319 Pepsi  3-Aug 94   169  168   4-Aug 73  173 120 94 129   5-Aug 68  209 131  135   6-Aug 130    207 216   7-Aug 117 104   150 335 Pepsi  8-Aug 108 101  125 206    9-Aug 95  80    150   10-Aug 131 96  97  141   11-Aug 92 99        116 102 202 115 194 226    . Cardiovascular risk reduction: o Current hypertensive regimen: amlodipine 5 mg QPM, losartan 50 mg QAM o Current hyperlipidemia regimen: atorvastatin 80 mg daily; LDL now at goal <70 . Peripheral Neuropathy: gabapentin 100 mg QPM . Osteoporosis: Prolia Q6M from [facility]; last dose April. However, reports that [facility] is no longer offering the medication and they are referring her to someone at [facility]. Notes that she hasn't heard anything about scheduling. Wonders if she can get the injection here. Vitamin D 2000 units daily, needs to pick up a new bottle of calcium supplement . Supplements: calcitriol 0.25 mg daily, vitamin D daily - needs to pick up a new bottle of calcium supplement  Pharmacist Clinical Goal(s):  Marland Kitchen Over the next 90 days, patient  with work with PharmD and primary care provider to address optimized management of diabetes  Interventions: . Comprehensive medication review performed, medication list updated in electronic medical record . Inter-disciplinary care team collaboration (see longitudinal plan of care) . Reviewed appropriate sulfonylurea administration. Patient will start taking glipizide 5 mg before supper. Continue Basaglar 20 units daily and Victoza 1.8 mg daily. Worry about use of mealtime insulin and risk of hypoglycemia, especially as patient's diet is variable. Reviewed that she needs to stop sugary sodas and monitor carbohydrate intake as discussed at dietary visit yesterday . Will collaborate w/ PCP and clinic staff to see if we can administer Prolia . Discussed use of FreeStyle Libre for more consistent review of glucoses and feedback on dietary choices. Patient is not injecting insulin 3+ times daily, so unlikely to qualify for insurance coverage. However, would be helpful to have patient wear a sample CGM for 2 weeks to evaluate glucose control. She notes she may be able  to pay the out of pocket cost of $75/month w/o insurance.   Patient Self Care Activities:  . Patient will check blood glucose daily, document, and provide at future appointments . Patient will check BP daily document, and provide at future appointments . Patient will take medications as prescribed . Patient will report any questions or concerns to provider   Please see past updates related to this goal by clicking on the "Past Updates" button in the selected goal         The patient verbalized understanding of instructions provided today and agreed to receive a mailed copy of patient instruction and/or educational materials. Plan: - Scheduled face to face in ~2 weeks to for CGM sample placement  [name], PharmD, [place], [place] Pharmacist [place] [phone]

## 2019-12-20 ENCOUNTER — Telehealth: Payer: Self-pay | Admitting: Family Medicine

## 2019-12-20 NOTE — Telephone Encounter (Signed)
Pharmacist, community for [employer] on [address]. ID# [id number]

## 2019-12-20 NOTE — Progress Notes (Signed)
Insurance verification for Prolia filed on [employer] Portal. 

## 2019-12-24 NOTE — Telephone Encounter (Signed)
Patient approved for prolia last Vit D 02/18/19 ok to schedule?

## 2019-12-24 NOTE — Telephone Encounter (Signed)
It is ok to schedule her. I believe she has been getting this through another office previously, so we may need to contact that office to determine her last dose.

## 2019-12-26 ENCOUNTER — Ambulatory Visit: Payer: Medicare Other | Admitting: Pharmacist

## 2019-12-26 DIAGNOSIS — N184 Chronic kidney disease, stage 4 (severe): Secondary | ICD-10-CM

## 2019-12-26 DIAGNOSIS — E1122 Type 2 diabetes mellitus with diabetic chronic kidney disease: Secondary | ICD-10-CM

## 2019-12-26 DIAGNOSIS — Z794 Long term (current) use of insulin: Secondary | ICD-10-CM

## 2019-12-26 NOTE — Telephone Encounter (Signed)
Noted. Wait to schedule until she's in next week.

## 2019-12-26 NOTE — Chronic Care Management (AMB) (Signed)
Chronic Care Management   Follow Up Note   12/26/2019 Name: Kelly [name] MRN: [id number] DOB: 07/18/1940  Referred by: [name], MD Reason for referral : Chronic Care Management (Medication Management)   Kelly [name] is a 79 y.o. year old female who is a primary care patient of Kelly [name], Kelly Adam, MD. The CCM team was consulted for assistance with chronic disease management and care coordination needs.    Patient contacted me with medication management question  Review of patient status, including review of consultants reports, relevant laboratory and other test results, and collaboration with appropriate care team members and the patient's provider was performed as part of comprehensive patient evaluation and provision of chronic care management services.    SDOH (Social Determinants of Health) assessments performed: No See Care Plan activities for detailed interventions related to Kelly [name])     Outpatient Encounter Medications as of 12/26/2019  Medication Sig Note  . amLODipine (NORVASC) 5 MG tablet Take 1 tablet (5 mg total) by mouth daily.   Marland Kitchen aspirin EC 81 MG tablet Take 81 mg by mouth daily.   Marland Kitchen atorvastatin (LIPITOR) 80 MG tablet TAKE 1 TABLET DAILY   . calcitRIOL (ROCALTROL) 0.25 MCG capsule Take 0.25 mcg by mouth daily.   . Calcium Citrate-Vitamin D (CALCIUM CITRATE +D PO) Take 600 mg by mouth daily. (Patient not taking: Reported on 12/18/2019)   . Cholecalciferol (VITAMIN D) 50 MCG (2000 UT) CAPS Take 2,000 Units by mouth daily.    . cyclobenzaprine (FLEXERIL) 10 MG tablet TAKE 1/2 TABLET (5MG  TOTAL)3 TIMES A DAY AS NEEDED    FOR MUSCLE SPASMS 01/31/2019: Using ~1/week for leg cramps  . denosumab (PROLIA) 60 MG/ML SOSY injection Inject 60 mg into the skin every 6 (six) months. 09/24/2018: Last dose in April   . docusate sodium (COLACE) 100 MG capsule Take 100 mg by mouth daily.    Marland Kitchen escitalopram (LEXAPRO) 10 MG tablet Take 0.5 tablets (5 mg total) by  mouth daily for 14 days, THEN 1 tablet (10 mg total) daily.   . furosemide (LASIX) 40 MG tablet Take 1 tablet (40 mg total) by mouth daily as needed. 12/17/2019: As needed  . gabapentin (NEURONTIN) 100 MG capsule Take 100 mg by mouth 2 (two) times daily. 12/18/2019: QPM  . glipiZIDE (GLUCOTROL) 5 MG tablet Take 0.5 tablets (2.5 mg total) by mouth daily with supper.   Marland Kitchen glucosamine-chondroitin 500-400 MG tablet Take 1 tablet by mouth 3 (three) times daily.   Marland Kitchen glucose blood (ACCU-CHEK AVIVA PLUS) test strip Use to check blood sugar up to 4 times daily   . HYDROcodone-acetaminophen (NORCO) 5-325 MG tablet Norco 5 mg-325 mg tablet  Take 1 tablet twice a day by oral route as needed. 12/18/2019: Never takes more than 1 per day; takes ~3 times per week  . Insulin Glargine (BASAGLAR KWIKPEN) 100 UNIT/ML INJECT 20 UNITS TOTAL      SUBCUTANEOUSLY DAILY   . Insulin Pen Needle (BD PEN NEEDLE NANO U/F) 32G X 4 MM MISC USE DAILY WITH VICTOZA AND BASAGLAR   . Lancets MISC Use up to 4 times daily to check blood sugars.   Marland Kitchen losartan (COZAAR) 50 MG tablet TAKE 1 TABLET DAILY   . Melatonin 5 MG CHEW Chew 5 mg by mouth daily.   Marland Kitchen PARoxetine (PAXIL-CR) 12.5 MG 24 hr tablet Take 1 tablet (12.5 mg total) by mouth daily.   . potassium chloride (K-DUR) 10 MEQ tablet potassium chloride ER 10 mEq tablet,extended  release 11/06/2017: Only takes when she takes furosemide  . valACYclovir (VALTREX) 1000 MG tablet Take 1 tablet (1,000 mg total) by mouth daily.   Marland Kitchen VICTOZA 18 MG/3ML SOPN INJECT 0.3ML (1.8MG  TOTAL) SUBCUTANEOUSLY DAILY    No facility-administered encounter medications on file as of 12/26/2019.     Objective:   Goals Addressed              This Visit's Progress     Patient Stated   .  "I want to work on my diabetes" (pt-stated)        CARE PLAN ENTRY (see longtitudinal plan of care for additional care plan information)  Current Barriers:  . Social, financial, and community barriers:  o Calls today to  report some morning hypoglycemia <70 with taking glipizide. Has stopped, and reports fastings remain well controlled 80-120s . Diabetes: uncontrolled at goal <7.5%; most recent H2R 9.7%; complicated by CKD . Current antihyperglycemic regimen: Victoza 1.8 mg daily, Basaglar 20 units daily, glipizide 5 mg QPM though has stopped in the past few days . Current glucose readings: o W/o glipizide over the past few days, having readings of 80s-120s . Cardiovascular risk reduction: o Current hypertensive regimen: amlodipine 5 mg QPM, losartan 50 mg QAM o Current hyperlipidemia regimen: atorvastatin 80 mg daily; LDL now at goal <70 . Peripheral Neuropathy: gabapentin 100 mg QPM . Osteoporosis: Approved to get Prolia from our clinic. Wondering last date.  . Supplements: calcitriol 0.25 mg daily, vitamin D daily - needs to pick up a new bottle of calcium supplement  Pharmacist Clinical Goal(s):  Marland Kitchen Over the next 90 days, patient with work with PharmD and primary care provider to address optimized management of diabetes  Interventions: . Comprehensive medication review performed, medication list updated in electronic medical record . Inter-disciplinary care team collaboration (see longitudinal plan of care) . Agree w/ patient w/ continuing to hold glipizide. Will review sugar readings at appointment next week.  . Reviewed approval for Prolia injection, will plan to schedule next dose around 03/13/20 when patient is in next week for appt  Patient Self Care Activities:  . Patient will check blood glucose daily, document, and provide at future appointments . Patient will check BP daily document, and provide at future appointments . Patient will take medications as prescribed . Patient will report any questions or concerns to provider   Please see past updates related to this goal by clicking on the "Past Updates" button in the selected goal          Plan:  - Will meet with patient next week for f/u  as previously scheduled  [name], PharmD, [place], [place] Pharmacist [name] [phone]

## 2019-12-26 NOTE — Telephone Encounter (Signed)
Patient found documentation that her last Prolia injection at [facility] was 09/11/2019. She will be in office next week for f/u, so we can schedule nurse visit for November at that time.   Catie

## 2019-12-26 NOTE — Patient Instructions (Signed)
Visit Information  Goals Addressed              This Visit's Progress     Patient Stated   .  "I want to work on my diabetes" (pt-stated)        CARE PLAN ENTRY (see longtitudinal plan of care for additional care plan information)  Current Barriers:  . Social, financial, and community barriers:  o Calls today to report some morning hypoglycemia <70 with taking glipizide. Has stopped, and reports fastings remain well controlled 80-120s . Diabetes: uncontrolled at goal <7.5%; most recent H7W 2.6%; complicated by CKD . Current antihyperglycemic regimen: Victoza 1.8 mg daily, Basaglar 20 units daily, glipizide 5 mg QPM though has stopped in the past few days . Current glucose readings: o W/o glipizide over the past few days, having readings of 80s-120s . Cardiovascular risk reduction: o Current hypertensive regimen: amlodipine 5 mg QPM, losartan 50 mg QAM o Current hyperlipidemia regimen: atorvastatin 80 mg daily; LDL now at goal <70 . Peripheral Neuropathy: gabapentin 100 mg QPM . Osteoporosis: Approved to get Prolia from our clinic. Wondering last date.  . Supplements: calcitriol 0.25 mg daily, vitamin D daily - needs to pick up a new bottle of calcium supplement  Pharmacist Clinical Goal(s):  Marland Kitchen Over the next 90 days, patient with work with PharmD and primary care provider to address optimized management of diabetes  Interventions: . Comprehensive medication review performed, medication list updated in electronic medical record . Inter-disciplinary care team collaboration (see longitudinal plan of care) . Agree w/ patient w/ continuing to hold glipizide. Will review sugar readings at appointment next week.  . Reviewed approval for Prolia injection, will plan to schedule next dose around 03/13/20 when patient is in next week for appt  Patient Self Care Activities:  . Patient will check blood glucose daily, document, and provide at future appointments . Patient will check BP daily  document, and provide at future appointments . Patient will take medications as prescribed . Patient will report any questions or concerns to provider   Please see past updates related to this goal by clicking on the "Past Updates" button in the selected goal         The patient verbalized understanding of instructions provided today and declined a print copy of patient instruction materials.    Plan:  - Will meet with patient next week for f/u as previously scheduled  [name], PharmD, [place], [place] Pharmacist [place] [phone]

## 2019-12-30 ENCOUNTER — Other Ambulatory Visit: Payer: Self-pay

## 2019-12-31 LAB — HM DIABETES EYE EXAM

## 2020-01-01 ENCOUNTER — Ambulatory Visit (INDEPENDENT_AMBULATORY_CARE_PROVIDER_SITE_OTHER): Payer: Medicare Other | Admitting: Family Medicine

## 2020-01-01 ENCOUNTER — Ambulatory Visit: Payer: Medicare Other | Admitting: Pharmacist

## 2020-01-01 ENCOUNTER — Encounter: Payer: Self-pay | Admitting: Family Medicine

## 2020-01-01 ENCOUNTER — Other Ambulatory Visit: Payer: Self-pay

## 2020-01-01 DIAGNOSIS — N184 Chronic kidney disease, stage 4 (severe): Secondary | ICD-10-CM

## 2020-01-01 DIAGNOSIS — G250 Essential tremor: Secondary | ICD-10-CM | POA: Diagnosis not present

## 2020-01-01 DIAGNOSIS — B029 Zoster without complications: Secondary | ICD-10-CM

## 2020-01-01 DIAGNOSIS — M810 Age-related osteoporosis without current pathological fracture: Secondary | ICD-10-CM

## 2020-01-01 DIAGNOSIS — Z794 Long term (current) use of insulin: Secondary | ICD-10-CM

## 2020-01-01 DIAGNOSIS — F32A Depression, unspecified: Secondary | ICD-10-CM

## 2020-01-01 DIAGNOSIS — M5481 Occipital neuralgia: Secondary | ICD-10-CM | POA: Diagnosis not present

## 2020-01-01 DIAGNOSIS — F329 Major depressive disorder, single episode, unspecified: Secondary | ICD-10-CM

## 2020-01-01 DIAGNOSIS — F419 Anxiety disorder, unspecified: Secondary | ICD-10-CM | POA: Diagnosis not present

## 2020-01-01 DIAGNOSIS — E1122 Type 2 diabetes mellitus with diabetic chronic kidney disease: Secondary | ICD-10-CM

## 2020-01-01 MED ORDER — FREESTYLE LIBRE 2 SENSOR MISC: 3 refills | Status: DC

## 2020-01-01 NOTE — Progress Notes (Signed)
Kelly Rumps, MD Phone: [phone]  Kelly [name] is a 79 y.o. female who presents today for f/u.  Osteoporosis: Taking calcium and vitamin D.  No recent fractures.  She is transitioning her Prolia to our office.  Anxiety/depression: She notes no significant symptoms with transitioning from Paxil to Lexapro.  Her daughter notes she is more quiet and low-key.  Chronic headaches: These are intermittent.  They occur only occasionally.  No changes from her baseline.  They will alternate between front and back of her head.  Shingles: Rash resolved.  No residual symptoms.  Essential tremor: Patient noted at her last visit that this was a little worse though since her last visit it has improved.  She has had slight tremor for 5 to 6 years.  Typically only bothers her when she holds a newspaper or if she holds her hands up.  Diabetes: Notes CBGs occasionally into the 60s and 70s in the morning.  She is no longer on glipizide.  They note that she takes her insulin at varying times and some days will take it early in the morning and some days will take it closer to noon.  Reports she saw the eye doctor yesterday and had a good checkup.  Social History   Tobacco Use  Smoking Status Never Smoker  Smokeless Tobacco Never Used     ROS see history of present illness  Objective  Physical Exam Vitals:   01/01/20 1318  BP: 116/78  Pulse: 82  Temp: 98.1 F (36.7 C)  SpO2: 98%    BP Readings from Last 3 Encounters:  01/01/20 116/78  12/02/19 (!) 130/70  10/20/19 (!) 147/83   Wt Readings from Last 3 Encounters:  01/01/20 139 lb 6.4 oz (63.2 kg)  12/17/19 140 lb 6.4 oz (63.7 kg)  12/02/19 141 lb 9.6 oz (64.2 kg)    Physical Exam Constitutional:      General: She is not in acute distress.    Appearance: She is not diaphoretic.  Cardiovascular:     Rate and Rhythm: Normal rate and regular rhythm.     Heart sounds: Normal heart sounds.  Pulmonary:     Effort: Pulmonary  effort is normal.     Breath sounds: Normal breath sounds.  Skin:    General: Skin is warm and dry.  Neurological:     Mental Status: She is alert.     Comments: PERRL, EOMI, facial sensation to light touch intact bilaterally, hearing intact to finger rub, able to open and close eyes appropriately, shoulder shrug intact, 5/5 strength in bilateral biceps, triceps, grip, quads, hamstrings, plantar and dorsiflexion, sensation to light touch intact in bilateral UE and LE, normal gait, slight fine tremor, no cogwheel rigidity in her arms      Assessment/Plan: Please see individual problem list.  DM (diabetes mellitus), type 2 with renal complications ([place]) Reinforced taking her insulin at a consistent time each day.  Our clinical pharmacist is discussing freestyle libre with the patient and getting her set up with this.  I think this will be quite beneficial.  The patient will follow up with the clinical pharmacist for her next visit regarding her diabetes.  Essential tremor Chronic.  Has improved since her last visit benign exam.  She will monitor.  Anxiety and depression Symptoms improved.  Continue Lexapro.  Discussed completing the taper off of Paxil.  Shingles Resolved.  Encouraged her to get the Shingrix vaccine.  Osteoporosis She will go ahead and schedule her nurse visit  for her Prolia injection for November.  Continue calcium and vitamin D.  Occipital neuralgia of left side Intermittent issues with headaches that have been unchanged for some time.  Discussed monitoring her symptoms and if they worsen or change in any manner letting us know.    No orders of the defined types were placed in this encounter.   No orders of the defined types were placed in this encounter.   This visit occurred during the SARS-CoV-2 public health emergency.  Safety protocols were in place, including screening questions prior to the visit, additional usage of staff PPE, and extensive cleaning of  exam room while observing appropriate contact time as indicated for disinfecting solutions.    Kelly Rumps, MD [place]

## 2020-01-01 NOTE — Chronic Care Management (AMB) (Signed)
Chronic Care Management   Follow Up Note   01/01/2020 Name: Kelly [name] MRN: [id number] DOB: [birth year]  Referred by: [name], MD Reason for referral : Chronic Care Management (Medication Management)   Kelly [name] is a 79 y.o. year old female who is a primary care patient of Caryl Bis, Angela Adam, MD. The CCM team was consulted for assistance with chronic disease management and care coordination needs.    Met with patient and daughter face to face for medication management review and Cgm teaching,  Review of patient status, including review of consultants reports, relevant laboratory and other test results, and collaboration with appropriate care team members and the patient's provider was performed as part of comprehensive patient evaluation and provision of chronic care management services.    SDOH (Social Determinants of Health) assessments performed: No See Care Plan activities for detailed interventions related to [facility])     Outpatient Encounter Medications as of 01/01/2020  Medication Sig Note  . Insulin Glargine (BASAGLAR KWIKPEN) 100 UNIT/ML INJECT 20 UNITS TOTAL      SUBCUTANEOUSLY DAILY   . amLODipine (NORVASC) 5 MG tablet Take 1 tablet (5 mg total) by mouth daily.   Marland Kitchen aspirin EC 81 MG tablet Take 81 mg by mouth daily.   Marland Kitchen atorvastatin (LIPITOR) 80 MG tablet TAKE 1 TABLET DAILY   . calcitRIOL (ROCALTROL) 0.25 MCG capsule Take 0.25 mcg by mouth daily.   . Calcium Citrate-Vitamin D (CALCIUM CITRATE +D PO) Take 600 mg by mouth daily.    . Cholecalciferol (VITAMIN D) 50 MCG (2000 UT) CAPS Take 2,000 Units by mouth daily.    . Continuous Blood Gluc Sensor (FREESTYLE LIBRE 2 SENSOR) MISC Use to check sugar at least 4 times daily   . cyclobenzaprine (FLEXERIL) 10 MG tablet TAKE 1/2 TABLET (5MG TOTAL)3 TIMES A DAY AS NEEDED    FOR MUSCLE SPASMS 01/31/2019: Using ~1/week for leg cramps  . denosumab (PROLIA) 60 MG/ML SOSY injection Inject 60 mg into the skin  every 6 (six) months. 09/24/2018: Last dose in April   . docusate sodium (COLACE) 100 MG capsule Take 100 mg by mouth daily.    Marland Kitchen escitalopram (LEXAPRO) 10 MG tablet Take 0.5 tablets (5 mg total) by mouth daily for 14 days, THEN 1 tablet (10 mg total) daily.   . furosemide (LASIX) 40 MG tablet Take 1 tablet (40 mg total) by mouth daily as needed. 12/17/2019: As needed  . gabapentin (NEURONTIN) 100 MG capsule Take 100 mg by mouth 2 (two) times daily. 12/18/2019: QPM  . glucosamine-chondroitin 500-400 MG tablet Take 1 tablet by mouth 3 (three) times daily.   Marland Kitchen glucose blood (ACCU-CHEK AVIVA PLUS) test strip Use to check blood sugar up to 4 times daily   . HYDROcodone-acetaminophen (NORCO) 5-325 MG tablet Norco 5 mg-325 mg tablet  Take 1 tablet twice a day by oral route as needed. 12/18/2019: Never takes more than 1 per day; takes ~3 times per week  . Insulin Pen Needle (BD PEN NEEDLE NANO U/F) 32G X 4 MM MISC USE DAILY WITH VICTOZA AND BASAGLAR   . Lancets MISC Use up to 4 times daily to check blood sugars.   Marland Kitchen losartan (COZAAR) 50 MG tablet TAKE 1 TABLET DAILY   . Melatonin 5 MG CHEW Chew 5 mg by mouth daily.   . potassium chloride (K-DUR) 10 MEQ tablet potassium chloride ER 10 mEq tablet,extended release 11/06/2017: Only takes when she takes furosemide  . VICTOZA 18 MG/3ML SOPN  INJECT 0.3ML (1.8MG TOTAL) SUBCUTANEOUSLY DAILY   . [DISCONTINUED] glipiZIDE (GLUCOTROL) 5 MG tablet Take 0.5 tablets (2.5 mg total) by mouth daily with supper.   . [DISCONTINUED] PARoxetine (PAXIL-CR) 12.5 MG 24 hr tablet Take 1 tablet (12.5 mg total) by mouth daily.   . [DISCONTINUED] valACYclovir (VALTREX) 1000 MG tablet Take 1 tablet (1,000 mg total) by mouth daily.    No facility-administered encounter medications on file as of 01/01/2020.     Objective:   Goals Addressed              This Visit's Progress     Patient Stated   .  "I want to work on my diabetes" (pt-stated)        CARE PLAN ENTRY (see  longtitudinal plan of care for additional care plan information)  Current Barriers:  . Social, financial, and community barriers:  o Daughter is concerned about patient remembering to take her insulin at a consistent time every day. Concerned about hypoglycemia. Discussed use of CGM previously . Diabetes: uncontrolled at goal <7.5%; most recent X3G 1.8%; complicated by CKD . Current antihyperglycemic regimen: Victoza 1.8 mg daily, Basaglar 20 units daily (though time of administration fluctuates) . Cardiovascular risk reduction: o Current hypertensive regimen: amlodipine 5 mg QPM, losartan 50 mg QAM o Current hyperlipidemia regimen: atorvastatin 80 mg daily; LDL now at goal <70 . Peripheral Neuropathy: gabapentin 100 mg QPM . Osteoporosis: Approved to get Prolia from our clinic. Due in October . Depression: transitioning from paroxetine to escitalopram. Daughter asks if escitalopram has any long term effects like lorazepam. . Supplements: calcitriol 0.25 mg daily, vitamin D daily, calcium  Pharmacist Clinical Goal(s):  Marland Kitchen Over the next 90 days, patient with work with PharmD and primary care provider to address optimized management of diabetes  Interventions: . Comprehensive medication review performed, medication list updated in electronic medical record . Inter-disciplinary care team collaboration (see longitudinal plan of care) . Educated on use of FreeStyle Libre CGM. Coached patient on placement. Patient and daughter verbalize understanding. Set low glucose alarm for 70 and high for 240 mg/dL. Set reminder at 10 am for patient to remember to take her [place]. Script is covered at [employer] for $30/month. [facility], and she will call me back if so. . Encouraged to continue to keep a food diary to correlate with glucose readings and patterns.  . Counseled on differences between SSRI and benzodiazepines, as daughter  thought that was the type of medication escitalopram was. Discussed geriatric safety of SSRI.    Patient Self Care Activities:  . Patient will check blood glucose daily, document, and provide at future appointments . Patient will check BP daily document, and provide at future appointments . Patient will take medications as prescribed . Patient will report any questions or concerns to provider   Please see past updates related to this goal by clicking on the "Past Updates" button in the selected goal          Plan:  - Scheduled f/u in ~ 2 weeks for CGM download   [name], PharmD, [place], [place] Pharmacist [place] [phone]

## 2020-01-01 NOTE — Assessment & Plan Note (Signed)
Resolved.  Encouraged her to get the Shingrix vaccine.

## 2020-01-01 NOTE — Assessment & Plan Note (Signed)
Chronic.  Has improved since her last visit benign exam.  She will monitor.

## 2020-01-01 NOTE — Assessment & Plan Note (Signed)
She will go ahead and schedule her nurse visit for her Prolia injection for November.  Continue calcium and vitamin D.

## 2020-01-01 NOTE — Patient Instructions (Addendum)
Visit Information  Goals Addressed              This Visit's Progress     Patient Stated   .  "I want to work on my diabetes" (pt-stated)        CARE PLAN ENTRY (see longtitudinal plan of care for additional care plan information)  Current Barriers:  . Social, financial, and community barriers:  o Daughter is concerned about patient remembering to take her insulin at a consistent time every day. Concerned about hypoglycemia. Discussed use of CGM previously . Diabetes: uncontrolled at goal <7.5%; most recent I7P 8.2%; complicated by CKD . Current antihyperglycemic regimen: Victoza 1.8 mg daily, Basaglar 20 units daily (though time of administration fluctuates) . Cardiovascular risk reduction: o Current hypertensive regimen: amlodipine 5 mg QPM, losartan 50 mg QAM o Current hyperlipidemia regimen: atorvastatin 80 mg daily; LDL now at goal <70 . Peripheral Neuropathy: gabapentin 100 mg QPM . Osteoporosis: Approved to get Prolia from our clinic. Due in October . Depression: transitioning from paroxetine to escitalopram. Daughter asks if escitalopram has any long term effects like lorazepam. . Supplements: calcitriol 0.25 mg daily, vitamin D daily, calcium  Pharmacist Clinical Goal(s):  Marland Kitchen Over the next 90 days, patient with work with PharmD and primary care provider to address optimized management of diabetes  Interventions: . Comprehensive medication review performed, medication list updated in electronic medical record . Inter-disciplinary care team collaboration (see longitudinal plan of care) . Educated on use of FreeStyle Libre CGM. Coached patient on placement. Patient and daughter verbalize understanding. Set low glucose alarm for 70 and high for 240 mg/dL. Set reminder at 10 am for patient to remember to take her [place]. Script is covered at [employer] for $30/month. [facility], and she will call me back if  so. . Encouraged to continue to keep a food diary to correlate with glucose readings and patterns.  . Counseled on differences between SSRI and benzodiazepines, as daughter thought that was the type of medication escitalopram was. Discussed geriatric safety of SSRI.    Patient Self Care Activities:  . Patient will check blood glucose daily, document, and provide at future appointments . Patient will check BP daily document, and provide at future appointments . Patient will take medications as prescribed . Patient will report any questions or concerns to provider   Please see past updates related to this goal by clicking on the "Past Updates" button in the selected goal         The patient verbalized understanding of instructions provided today and declined a print copy of patient instruction materials.    Plan:  - Scheduled f/u in ~ 2 weeks for CGM download   [name], PharmD, [place], [facility] Pharmacist [place] [phone]

## 2020-01-01 NOTE — Assessment & Plan Note (Signed)
Reinforced taking her insulin at a consistent time each day.  Our clinical pharmacist is discussing freestyle libre with the patient and getting her set up with this.  I think this will be quite beneficial.  The patient will follow up with the clinical pharmacist for her next visit regarding her diabetes.

## 2020-01-01 NOTE — Assessment & Plan Note (Signed)
Intermittent issues with headaches that have been unchanged for some time.  Discussed monitoring her symptoms and if they worsen or change in any manner letting us know.

## 2020-01-01 NOTE — Patient Instructions (Addendum)
Nice to see you. Please take your insulin at the exact same time each day. Please complete the taper off of Paxil and on to Lexapro. If your headaches worsen please let us know. Please get the Shingrix vaccine from the pharmacy

## 2020-01-01 NOTE — Assessment & Plan Note (Signed)
Symptoms improved.  Continue Lexapro.  Discussed completing the taper off of Paxil.

## 2020-01-02 ENCOUNTER — Ambulatory Visit: Payer: Medicare Other | Admitting: Pharmacist

## 2020-01-02 DIAGNOSIS — N184 Chronic kidney disease, stage 4 (severe): Secondary | ICD-10-CM

## 2020-01-02 DIAGNOSIS — E1122 Type 2 diabetes mellitus with diabetic chronic kidney disease: Secondary | ICD-10-CM

## 2020-01-02 DIAGNOSIS — Z794 Long term (current) use of insulin: Secondary | ICD-10-CM | POA: Diagnosis not present

## 2020-01-02 MED ORDER — FREESTYLE LIBRE 2 SENSOR MISC: 3 refills | Status: DC

## 2020-01-02 NOTE — Chronic Care Management (AMB) (Signed)
Chronic Care Management   Follow Up Note   01/02/2020 Name: Kelly [name] MRN: [id number] DOB: 1940/08/01  Referred by: [name], MD Reason for referral : Chronic Care Management (Medication Management)   Kelly [name] is a 79 y.o. year old female who is a primary care patient of Caryl Bis, Angela Adam, MD. The CCM team was consulted for assistance with chronic disease management and care coordination needs.    Received call from patient today with FreeStyle Libre device question.  Review of patient status, including review of consultants reports, relevant laboratory and other test results, and collaboration with appropriate care team members and the patient's provider was performed as part of comprehensive patient evaluation and provision of chronic care management services.    SDOH (Social Determinants of Health) assessments performed: No See Care Plan activities for detailed interventions related to [facility])     Outpatient Encounter Medications as of 01/02/2020  Medication Sig Note  . amLODipine (NORVASC) 5 MG tablet Take 1 tablet (5 mg total) by mouth daily.   Marland Kitchen aspirin EC 81 MG tablet Take 81 mg by mouth daily.   Marland Kitchen atorvastatin (LIPITOR) 80 MG tablet TAKE 1 TABLET DAILY   . calcitRIOL (ROCALTROL) 0.25 MCG capsule Take 0.25 mcg by mouth daily.   . Calcium Citrate-Vitamin D (CALCIUM CITRATE +D PO) Take 600 mg by mouth daily.    . Cholecalciferol (VITAMIN D) 50 MCG (2000 UT) CAPS Take 2,000 Units by mouth daily.    . Continuous Blood Gluc Sensor (FREESTYLE LIBRE 2 SENSOR) MISC Use to check sugar at least 4 times daily   . cyclobenzaprine (FLEXERIL) 10 MG tablet TAKE 1/2 TABLET (5MG  TOTAL)3 TIMES A DAY AS NEEDED    FOR MUSCLE SPASMS 01/31/2019: Using ~1/week for leg cramps  . denosumab (PROLIA) 60 MG/ML SOSY injection Inject 60 mg into the skin every 6 (six) months. 09/24/2018: Last dose in April   . docusate sodium (COLACE) 100 MG capsule Take 100 mg by mouth daily.     Marland Kitchen escitalopram (LEXAPRO) 10 MG tablet Take 0.5 tablets (5 mg total) by mouth daily for 14 days, THEN 1 tablet (10 mg total) daily.   . furosemide (LASIX) 40 MG tablet Take 1 tablet (40 mg total) by mouth daily as needed. 12/17/2019: As needed  . gabapentin (NEURONTIN) 100 MG capsule Take 100 mg by mouth 2 (two) times daily. 12/18/2019: QPM  . glucosamine-chondroitin 500-400 MG tablet Take 1 tablet by mouth 3 (three) times daily.   Marland Kitchen glucose blood (ACCU-CHEK AVIVA PLUS) test strip Use to check blood sugar up to 4 times daily   . HYDROcodone-acetaminophen (NORCO) 5-325 MG tablet Norco 5 mg-325 mg tablet  Take 1 tablet twice a day by oral route as needed. 12/18/2019: Never takes more than 1 per day; takes ~3 times per week  . Insulin Glargine (BASAGLAR KWIKPEN) 100 UNIT/ML INJECT 20 UNITS TOTAL      SUBCUTANEOUSLY DAILY   . Insulin Pen Needle (BD PEN NEEDLE NANO U/F) 32G X 4 MM MISC USE DAILY WITH VICTOZA AND BASAGLAR   . Lancets MISC Use up to 4 times daily to check blood sugars.   Marland Kitchen losartan (COZAAR) 50 MG tablet TAKE 1 TABLET DAILY   . Melatonin 5 MG CHEW Chew 5 mg by mouth daily.   . potassium chloride (K-DUR) 10 MEQ tablet potassium chloride ER 10 mEq tablet,extended release 11/06/2017: Only takes when she takes furosemide  . VICTOZA 18 MG/3ML SOPN INJECT 0.3ML (1.8MG  TOTAL) SUBCUTANEOUSLY  DAILY   . [DISCONTINUED] Continuous Blood Gluc Sensor (FREESTYLE LIBRE 2 SENSOR) MISC Use to check sugar at least 4 times daily    No facility-administered encounter medications on file as of 01/02/2020.     Objective:   Goals Addressed              This Visit's Progress     Patient Stated   .  "I want to work on my diabetes" (pt-stated)        CARE PLAN ENTRY (see longtitudinal plan of care for additional care plan information)  Current Barriers:  . Social, financial, and community barriers:  o Patient calls today to report that the [place] 2 sensor sample placed yesterday never started working,  and is giving her an error message that she needs to place a new sensor. . Diabetes: uncontrolled at goal <7.5%; most recent Q6V 7.8%; complicated by CKD . Current antihyperglycemic regimen: Victoza 1.8 mg daily, Basaglar 20 units daily  . Cardiovascular risk reduction: o Current hypertensive regimen: amlodipine 5 mg QPM, losartan 50 mg QAM o Current hyperlipidemia regimen: atorvastatin 80 mg daily; LDL now at goal <70 . Peripheral Neuropathy: gabapentin 100 mg QPM . Osteoporosis: Approved to get Prolia from our clinic. Due in October . Depression: escitalopram 5 mg daily while completing paroxetine taper . Supplements: calcitriol 0.25 mg daily, vitamin D daily, calcium  Pharmacist Clinical Goal(s):  Marland Kitchen Over the next 90 days, patient with work with PharmD and primary care provider to address optimized management of diabetes  Interventions: . Inter-disciplinary care team collaboration (see longitudinal plan of care) . Counseled to contact [place] customer support to request a replacement sensor . Patient notes that she plans to pick up a 1 month supply from [place], but requests 3 month supply [facility]. Will do so today.    Patient Self Care Activities:  . Patient will check blood glucose at least 4 times daily using CGM and provide at future appointments . Patient will take medications as prescribed . Patient will report any questions or concerns to provider   Please see past updates related to this goal by clicking on the "Past Updates" button in the selected goal          Plan:  - Will follow up as previously scheduled  [name], PharmD, [place], [place] Pharmacist [place] [phone]

## 2020-01-02 NOTE — Patient Instructions (Signed)
Visit Information  Goals Addressed              This Visit's Progress     Patient Stated   .  "I want to work on my diabetes" (pt-stated)        CARE PLAN ENTRY (see longtitudinal plan of care for additional care plan information)  Current Barriers:  . Social, financial, and community barriers:  o Patient calls today to report that the [place] 2 sensor sample placed yesterday never started working, and is giving her an error message that she needs to place a new sensor. . Diabetes: uncontrolled at goal <7.5%; most recent I3K 7.4%; complicated by CKD . Current antihyperglycemic regimen: Victoza 1.8 mg daily, Basaglar 20 units daily  . Cardiovascular risk reduction: o Current hypertensive regimen: amlodipine 5 mg QPM, losartan 50 mg QAM o Current hyperlipidemia regimen: atorvastatin 80 mg daily; LDL now at goal <70 . Peripheral Neuropathy: gabapentin 100 mg QPM . Osteoporosis: Approved to get Prolia from our clinic. Due in October . Depression: escitalopram 5 mg daily while completing paroxetine taper . Supplements: calcitriol 0.25 mg daily, vitamin D daily, calcium  Pharmacist Clinical Goal(s):  Marland Kitchen Over the next 90 days, patient with work with PharmD and primary care provider to address optimized management of diabetes  Interventions: . Inter-disciplinary care team collaboration (see longitudinal plan of care) . Counseled to contact [place] customer support to request a replacement sensor . Patient notes that she plans to pick up a 1 month supply from [place], but requests 3 month supply [facility]. Will do so today.    Patient Self Care Activities:  . Patient will check blood glucose at least 4 times daily using CGM and provide at future appointments . Patient will take medications as prescribed . Patient will report any questions or concerns to provider   Please see past updates related to this goal by clicking on the "Past Updates" button in the  selected goal         The patient verbalized understanding of instructions provided today and declined a print copy of patient instruction materials.   Plan:  - Will follow up as previously scheduled  [name], PharmD, [place], [place] Pharmacist [place] [phone]

## 2020-01-08 DIAGNOSIS — M47816 Spondylosis without myelopathy or radiculopathy, lumbar region: Secondary | ICD-10-CM | POA: Diagnosis not present

## 2020-01-08 DIAGNOSIS — E119 Type 2 diabetes mellitus without complications: Secondary | ICD-10-CM | POA: Diagnosis not present

## 2020-01-08 DIAGNOSIS — M25572 Pain in left ankle and joints of left foot: Secondary | ICD-10-CM | POA: Diagnosis not present

## 2020-01-08 DIAGNOSIS — M5032 Other cervical disc degeneration, mid-cervical region, unspecified level: Secondary | ICD-10-CM | POA: Diagnosis not present

## 2020-01-08 DIAGNOSIS — H35372 Puckering of macula, left eye: Secondary | ICD-10-CM | POA: Diagnosis not present

## 2020-01-08 DIAGNOSIS — M545 Low back pain: Secondary | ICD-10-CM | POA: Diagnosis not present

## 2020-01-08 DIAGNOSIS — M546 Pain in thoracic spine: Secondary | ICD-10-CM | POA: Diagnosis not present

## 2020-01-08 DIAGNOSIS — T2101XA Burn of unspecified degree of chest wall, initial encounter: Secondary | ICD-10-CM | POA: Diagnosis not present

## 2020-01-08 DIAGNOSIS — M25559 Pain in unspecified hip: Secondary | ICD-10-CM | POA: Diagnosis not present

## 2020-01-08 DIAGNOSIS — M7062 Trochanteric bursitis, left hip: Secondary | ICD-10-CM | POA: Diagnosis not present

## 2020-01-08 DIAGNOSIS — M4312 Spondylolisthesis, cervical region: Secondary | ICD-10-CM | POA: Diagnosis not present

## 2020-01-08 DIAGNOSIS — M6281 Muscle weakness (generalized): Secondary | ICD-10-CM | POA: Diagnosis not present

## 2020-01-08 DIAGNOSIS — M25519 Pain in unspecified shoulder: Secondary | ICD-10-CM | POA: Diagnosis not present

## 2020-01-08 DIAGNOSIS — M5136 Other intervertebral disc degeneration, lumbar region: Secondary | ICD-10-CM | POA: Diagnosis not present

## 2020-01-08 DIAGNOSIS — M47812 Spondylosis without myelopathy or radiculopathy, cervical region: Secondary | ICD-10-CM | POA: Diagnosis not present

## 2020-01-08 DIAGNOSIS — M542 Cervicalgia: Secondary | ICD-10-CM | POA: Diagnosis not present

## 2020-01-08 DIAGNOSIS — E1165 Type 2 diabetes mellitus with hyperglycemia: Secondary | ICD-10-CM | POA: Diagnosis not present

## 2020-01-08 DIAGNOSIS — Q762 Congenital spondylolisthesis: Secondary | ICD-10-CM | POA: Diagnosis not present

## 2020-01-08 DIAGNOSIS — M47814 Spondylosis without myelopathy or radiculopathy, thoracic region: Secondary | ICD-10-CM | POA: Diagnosis not present

## 2020-01-17 ENCOUNTER — Ambulatory Visit (INDEPENDENT_AMBULATORY_CARE_PROVIDER_SITE_OTHER): Payer: Medicare Other | Admitting: Pharmacist

## 2020-01-17 ENCOUNTER — Other Ambulatory Visit: Payer: Self-pay

## 2020-01-17 ENCOUNTER — Ambulatory Visit: Payer: Medicare Other

## 2020-01-17 DIAGNOSIS — Z794 Long term (current) use of insulin: Secondary | ICD-10-CM

## 2020-01-17 DIAGNOSIS — N184 Chronic kidney disease, stage 4 (severe): Secondary | ICD-10-CM | POA: Diagnosis not present

## 2020-01-17 DIAGNOSIS — E1122 Type 2 diabetes mellitus with diabetic chronic kidney disease: Secondary | ICD-10-CM | POA: Diagnosis not present

## 2020-01-17 MED ORDER — BASAGLAR KWIKPEN 100 UNIT/ML ~~LOC~~ SOPN: 18.0000 [IU] | PEN_INJECTOR | Freq: Every day | SUBCUTANEOUS | 0 refills | Status: DC

## 2020-01-17 MED ORDER — OZEMPIC (0.25 OR 0.5 MG/DOSE) 2 MG/1.5ML ~~LOC~~ SOPN: 0.5000 mg | PEN_INJECTOR | SUBCUTANEOUS | 2 refills | Status: DC

## 2020-01-17 NOTE — Patient Instructions (Addendum)
Ms. Brase,   Here is what we are going to do today:  1) DECREASE Basaglar to 18 units daily 2) When you get it from mail order, START Ozempic 0.5 mg weekly. You may have a bit more stomach fullness with this medication. If you start to have low blood sugars, please call me. We can reduce your Basaglar again.   Call me with any questions!  [name], PharmD [phone]  Visit Information  Goals Addressed              This Visit's Progress     Patient Stated   .  "I want to work on my diabetes" (pt-stated)        CARE PLAN ENTRY (see longtitudinal plan of care for additional care plan information)  Current Barriers:  . Social, financial, and community barriers:  o None noted. Loves using CGM. . Diabetes: uncontrolled at goal <7.5%; most recent Z8H 8.8%; complicated by CKD . Current antihyperglycemic regimen: Victoza 1.8 mg daily, Basaglar 20 units daily  . Current glucose readings: Date of Download: 8/28-9/10/21 % Time CGM is active: 95% Average Glucose: 147 mg/dL Glucose Management Indicator: 6.8% Glucose Variability: 39.9% (goal <36%) Time in Goal:  - Time in range 70-180: 73% - Time above range: 25% - Time below range: 2% Observed patterns: occasional fasting/midday hypoglycemia. Still post-prandial elevations, especially after larger meals (patient has been keeping a food log)  . Cardiovascular risk reduction: o Current hypertensive regimen: amlodipine 5 mg QPM, losartan 50 mg QAM - Reports home BP readings at goal o Current hyperlipidemia regimen: atorvastatin 80 mg daily; LDL now at goal <70 . Peripheral Neuropathy: gabapentin 100 mg QPM . Osteoporosis: Approved to get Prolia from our clinic. Due in October . Depression: escitalopram 5 mg  . Supplements: calcitriol 0.25 mg daily, vitamin D daily, calcium  Pharmacist Clinical Goal(s):  Marland Kitchen Over the next 90 days, patient with work with PharmD and primary care provider to address optimized management of  diabetes  Interventions: . Comprehensive medication review performed, medication list updated in electronic medical record . Interdisciplinary care team collaboration (see longitudinal plan of care) . Reviewed CGM download w/ patient. To reduce risk of fasting hypoglycemia, reduce Basaglar to 18 units daily. To better target post prandials and hopefully reduce insulin burden, will transition to Ozempic. Discussed importance of adhering to a strategy to remember weekly administration. Patient will start Ozempic 0.5 mg weekly when the medication arrives from [facility] mail order. Patient is to contact me if she has continued hypoglycemia and we will further reduce insulin . Dietary discussion regarding identifying meals that raise sugars.   Patient Self Care Activities:  . Patient will check blood glucose at least 4 times daily using CGM and provide at future appointments . Patient will take medications as prescribed . Patient will report any questions or concerns to provider   Please see past updates related to this goal by clicking on the "Past Updates" button in the selected goal         The patient verbalized understanding of instructions provided today and declined a print copy of patient instruction materials.   Plan:  - Scheduled f/u face to face in ~ 5 weeks  [name], PharmD, [place], [place] Pharmacist [place] [phone]

## 2020-01-17 NOTE — Chronic Care Management (AMB) (Addendum)
Chronic Care Management   Follow Up Note   01/17/2020 Name: Kelly [name] MRN: [id number] DOB: 04-07-1941  Referred by: [name], MD Reason for referral : Chronic Care Management (Medication Management)   Kelly [name] is a 79 y.o. year old female who is a primary care patient of Caryl Bis, Angela Adam, MD. The CCM team was consulted for assistance with chronic disease management and care coordination needs.    Met with patient face to face for CGM download and review.  Review of patient status, including review of consultants reports, relevant laboratory and other test results, and collaboration with appropriate care team members and the patient's provider was performed as part of comprehensive patient evaluation and provision of chronic care management services.    SDOH (Social Determinants of Health) assessments performed: Yes See Care Plan activities for detailed interventions related to SDOH)  SDOH Interventions     Most Recent Value  SDOH Interventions  Financial Strain Interventions Intervention Not Indicated       Outpatient Encounter Medications as of 01/17/2020  Medication Sig Note  . Insulin Glargine (BASAGLAR KWIKPEN) 100 UNIT/ML Inject 18 Units into the skin daily.   . [DISCONTINUED] Insulin Glargine (BASAGLAR KWIKPEN) 100 UNIT/ML INJECT 20 UNITS TOTAL      SUBCUTANEOUSLY DAILY   . [DISCONTINUED] VICTOZA 18 MG/3ML SOPN INJECT 0.3ML (1.8MG TOTAL) SUBCUTANEOUSLY DAILY   . amLODipine (NORVASC) 5 MG tablet Take 1 tablet (5 mg total) by mouth daily.   Marland Kitchen aspirin EC 81 MG tablet Take 81 mg by mouth daily.   Marland Kitchen atorvastatin (LIPITOR) 80 MG tablet TAKE 1 TABLET DAILY   . calcitRIOL (ROCALTROL) 0.25 MCG capsule Take 0.25 mcg by mouth daily.   . Calcium Citrate-Vitamin D (CALCIUM CITRATE +D PO) Take 600 mg by mouth daily.    . Cholecalciferol (VITAMIN D) 50 MCG (2000 UT) CAPS Take 2,000 Units by mouth daily.    . Continuous Blood Gluc Sensor (FREESTYLE LIBRE  2 SENSOR) MISC Use to check sugar at least 4 times daily   . cyclobenzaprine (FLEXERIL) 10 MG tablet TAKE 1/2 TABLET (5MG TOTAL)3 TIMES A DAY AS NEEDED    FOR MUSCLE SPASMS 01/31/2019: Using ~1/week for leg cramps  . denosumab (PROLIA) 60 MG/ML SOSY injection Inject 60 mg into the skin every 6 (six) months. 09/24/2018: Last dose in April   . docusate sodium (COLACE) 100 MG capsule Take 100 mg by mouth daily.    Marland Kitchen escitalopram (LEXAPRO) 10 MG tablet Take 0.5 tablets (5 mg total) by mouth daily for 14 days, THEN 1 tablet (10 mg total) daily.   . furosemide (LASIX) 40 MG tablet Take 1 tablet (40 mg total) by mouth daily as needed. 12/17/2019: As needed  . gabapentin (NEURONTIN) 100 MG capsule Take 100 mg by mouth 2 (two) times daily. 12/18/2019: QPM  . glucosamine-chondroitin 500-400 MG tablet Take 1 tablet by mouth 3 (three) times daily.   Marland Kitchen glucose blood (ACCU-CHEK AVIVA PLUS) test strip Use to check blood sugar up to 4 times daily   . HYDROcodone-acetaminophen (NORCO) 5-325 MG tablet Norco 5 mg-325 mg tablet  Take 1 tablet twice a day by oral route as needed. 12/18/2019: Never takes more than 1 per day; takes ~3 times per week  . Lancets MISC Use up to 4 times daily to check blood sugars.   Marland Kitchen losartan (COZAAR) 50 MG tablet TAKE 1 TABLET DAILY   . Melatonin 5 MG CHEW Chew 5 mg by mouth daily.   Marland Kitchen  potassium chloride (K-DUR) 10 MEQ tablet potassium chloride ER 10 mEq tablet,extended release 11/06/2017: Only takes when she takes furosemide  . Semaglutide,0.25 or 0.5MG/DOS, (OZEMPIC, 0.25 OR 0.5 MG/DOSE,) 2 MG/1.5ML SOPN Inject 0.5 mg into the skin once a week.   . [DISCONTINUED] Insulin Pen Needle (BD PEN NEEDLE NANO U/F) 32G X 4 MM MISC USE DAILY WITH VICTOZA AND BASAGLAR    No facility-administered encounter medications on file as of 01/17/2020.     Objective:          Goals Addressed              This Visit's Progress     Patient Stated   .  "I want to work on my diabetes" (pt-stated)         CARE PLAN ENTRY (see longtitudinal plan of care for additional care plan information)  Current Barriers:  . Social, financial, and community barriers:  o None noted. Loves using CGM. . Diabetes: uncontrolled at goal <7.5%; most recent K4Y 1.8%; complicated by CKD . Current antihyperglycemic regimen: Victoza 1.8 mg daily, Basaglar 20 units daily  . Current glucose readings: Date of Download: 8/28-9/10/21 % Time CGM is active: 95% Average Glucose: 147 mg/dL Glucose Management Indicator: 6.8% Glucose Variability: 39.9% (goal <36%) Time in Goal:  - Time in range 70-180: 73% - Time above range: 25% - Time below range: 2% Observed patterns: occasional fasting/midday hypoglycemia. Still post-prandial elevations, especially after larger meals (patient has been keeping a food log)  . Cardiovascular risk reduction: o Current hypertensive regimen: amlodipine 5 mg QPM, losartan 50 mg QAM - Reports home BP readings at goal o Current hyperlipidemia regimen: atorvastatin 80 mg daily; LDL now at goal <70 . Peripheral Neuropathy: gabapentin 100 mg QPM . Osteoporosis: Approved to get Prolia from our clinic. Due in October . Depression: escitalopram 5 mg  . Supplements: calcitriol 0.25 mg daily, vitamin D daily, calcium  Pharmacist Clinical Goal(s):  Marland Kitchen Over the next 90 days, patient with work with PharmD and primary care provider to address optimized management of diabetes  Interventions: . Comprehensive medication review performed, medication list updated in electronic medical record . Interdisciplinary care team collaboration (see longitudinal plan of care) . Reviewed CGM download w/ patient. To reduce risk of fasting hypoglycemia, reduce Basaglar to 18 units daily. To better target post prandials and hopefully reduce insulin burden, will transition to Ozempic. Discussed importance of adhering to a strategy to remember weekly administration. Patient will start Ozempic 0.5 mg weekly when the  medication arrives [facility]. Patient is to contact me if she has continued hypoglycemia and we will further reduce insulin . Dietary discussion regarding identifying meals that raise sugars.   Patient Self Care Activities:  . Patient will check blood glucose at least 4 times daily using CGM and provide at future appointments . Patient will take medications as prescribed . Patient will report any questions or concerns to provider   Please see past updates related to this goal by clicking on the "Past Updates" button in the selected goal          Plan:  - Scheduled f/u face to face in ~ 5 weeks  [name], PharmD, [place], [place] Pharmacist [place] [phone]

## 2020-01-20 ENCOUNTER — Telehealth: Payer: Self-pay

## 2020-01-20 ENCOUNTER — Telehealth: Payer: Self-pay | Admitting: Family Medicine

## 2020-01-20 NOTE — Telephone Encounter (Signed)
Rejection Reason - Patient DeclinedBiochemist, clinical - Endocrinology said about 5 hours ago  Patient called to cancel appt and stated she will have her PCP take care of her diabetes and does not want to see an endocrinologist.

## 2020-01-27 NOTE — Telephone Encounter (Signed)
I do not see this in my basket.

## 2020-01-28 ENCOUNTER — Other Ambulatory Visit: Payer: Self-pay | Admitting: Family Medicine

## 2020-01-28 DIAGNOSIS — R252 Cramp and spasm: Secondary | ICD-10-CM

## 2020-01-29 DIAGNOSIS — D2261 Melanocytic nevi of right upper limb, including shoulder: Secondary | ICD-10-CM | POA: Diagnosis not present

## 2020-01-29 DIAGNOSIS — D2262 Melanocytic nevi of left upper limb, including shoulder: Secondary | ICD-10-CM | POA: Diagnosis not present

## 2020-01-29 DIAGNOSIS — L570 Actinic keratosis: Secondary | ICD-10-CM | POA: Diagnosis not present

## 2020-01-29 DIAGNOSIS — Z85828 Personal history of other malignant neoplasm of skin: Secondary | ICD-10-CM | POA: Diagnosis not present

## 2020-01-29 DIAGNOSIS — D2272 Melanocytic nevi of left lower limb, including hip: Secondary | ICD-10-CM | POA: Diagnosis not present

## 2020-01-29 DIAGNOSIS — D225 Melanocytic nevi of trunk: Secondary | ICD-10-CM | POA: Diagnosis not present

## 2020-01-29 DIAGNOSIS — X32XXXA Exposure to sunlight, initial encounter: Secondary | ICD-10-CM | POA: Diagnosis not present

## 2020-01-30 ENCOUNTER — Ambulatory Visit: Payer: Medicare Other | Admitting: Podiatry

## 2020-02-04 ENCOUNTER — Encounter: Payer: Self-pay | Admitting: Podiatry

## 2020-02-04 ENCOUNTER — Ambulatory Visit (INDEPENDENT_AMBULATORY_CARE_PROVIDER_SITE_OTHER): Payer: Medicare Other | Admitting: Podiatry

## 2020-02-04 ENCOUNTER — Other Ambulatory Visit: Payer: Self-pay

## 2020-02-04 DIAGNOSIS — Z794 Long term (current) use of insulin: Secondary | ICD-10-CM

## 2020-02-04 DIAGNOSIS — N184 Chronic kidney disease, stage 4 (severe): Secondary | ICD-10-CM

## 2020-02-04 DIAGNOSIS — B351 Tinea unguium: Secondary | ICD-10-CM | POA: Diagnosis not present

## 2020-02-04 DIAGNOSIS — E1122 Type 2 diabetes mellitus with diabetic chronic kidney disease: Secondary | ICD-10-CM

## 2020-02-04 DIAGNOSIS — M79675 Pain in left toe(s): Secondary | ICD-10-CM | POA: Diagnosis not present

## 2020-02-04 DIAGNOSIS — M79674 Pain in right toe(s): Secondary | ICD-10-CM | POA: Diagnosis not present

## 2020-02-05 ENCOUNTER — Encounter: Payer: Self-pay | Admitting: Podiatry

## 2020-02-05 NOTE — Progress Notes (Signed)
  Subjective:  Patient ID: Kelly [name], female    DOB: 06-Nov-1940,  MRN: [id number]  Chief Complaint  Patient presents with  . Diabetes    DFC   79 y.o. female returns for the above complaint.  Patient presents with thickened elongated dystrophic toenails x10.  Patient states is painful to touch.  She would like to have them debrided down as she is not able to do it herself.  She denies any other acute complaints.  She does not have any other foot and ankle pain.  She is a diabetic with last A1c of 7.6 Objective:  There were no vitals filed for this visit. Podiatric Exam: Vascular: dorsalis pedis and posterior tibial pulses are palpable bilateral. Capillary return is immediate. Temperature gradient is WNL. Skin turgor WNL  Sensorium: Normal Semmes Weinstein monofilament test. Normal tactile sensation bilaterally. Nail Exam: Pt has thick disfigured discolored nails with subungual debris noted bilateral entire nail hallux through fifth toenails.  Pain on palpation to the nails. Ulcer Exam: There is no evidence of ulcer or pre-ulcerative changes or infection. Orthopedic Exam: Muscle tone and strength are WNL. No limitations in general ROM. No crepitus or effusions noted. HAV  B/L.  Hammer toes 2-5  B/L. Skin: No Porokeratosis. No infection or ulcers    Assessment & Plan:  No diagnosis found.  Patient was evaluated and treated and all questions answered.  Onychomycosis with pain  -Nails palliatively debrided as below. -Educated on self-care  Procedure: Nail Debridement Rationale: pain  Type of Debridement: manual, sharp debridement. Instrumentation: Nail nipper, rotary burr. Number of Nails: 10  Procedures and Treatment: Consent by patient was obtained for treatment procedures. The patient understood the discussion of treatment and procedures well. All questions were answered thoroughly reviewed. Debridement of mycotic and hypertrophic toenails, 1 through 5 bilateral and  clearing of subungual debris. No ulceration, no infection noted.  Return Visit-Office Procedure: Patient instructed to return to the office for a follow up visit 3 months for continued evaluation and treatment.  Boneta Lucks, DPM    No follow-ups on file.

## 2020-02-10 ENCOUNTER — Ambulatory Visit: Payer: Medicare Other | Admitting: Endocrinology

## 2020-02-10 ENCOUNTER — Ambulatory Visit: Payer: Medicare Other | Admitting: Pharmacist

## 2020-02-10 DIAGNOSIS — E1122 Type 2 diabetes mellitus with diabetic chronic kidney disease: Secondary | ICD-10-CM

## 2020-02-10 DIAGNOSIS — N184 Chronic kidney disease, stage 4 (severe): Secondary | ICD-10-CM

## 2020-02-10 MED ORDER — BASAGLAR KWIKPEN 100 UNIT/ML ~~LOC~~ SOPN: 14.0000 [IU] | PEN_INJECTOR | Freq: Every day | SUBCUTANEOUS | 0 refills | Status: DC

## 2020-02-10 NOTE — Patient Instructions (Signed)
Visit Information  Goals Addressed              This Visit's Progress     Patient Stated     "I want to work on my diabetes" (pt-stated)        CARE PLAN ENTRY (see longtitudinal plan of care for additional care plan information)  Current Barriers:   Social, financial, and community barriers:  o Calls today with reports of hypoglycemia  Diabetes: uncontrolled at goal <7.5%; most recent B0W 8.8%; complicated by CKD  Current antihyperglycemic regimen: Ozempic 0.5 mg weekly, Basaglar 18 units daily   Current glucose readings: o Reports episodes of fasting hypoglycemia in 50s  Cardiovascular risk reduction: o Current hypertensive regimen: amlodipine 5 mg QPM, losartan 50 mg QAM o Current hyperlipidemia regimen: atorvastatin 80 mg daily; LDL at goal <70  Peripheral Neuropathy: gabapentin 100 mg QPM  Osteoporosis: scheduled for Prolia injection in November  Depression: escitalopram 5 mg   Supplements: calcitriol 0.25 mg daily, vitamin D daily, calcium  Pharmacist Clinical Goal(s):   Over the next 90 days, patient with work with PharmD and primary care provider to address optimized management of diabetes  Interventions:  Comprehensive medication review performed, medication list updated in electronic medical record  Interdisciplinary care team collaboration (see longitudinal plan of care)  Reduce Basaglar to 14 units daily. Patient instructed to call me next week if continued episodes of hypoglycemia.   Patient Self Care Activities:   Patient will check blood glucose at least 4 times daily using CGM and provide at future appointments  Patient will take medications as prescribed  Patient will report any questions or concerns to provider   Please see past updates related to this goal by clicking on the "Past Updates" button in the selected goal         The patient verbalized understanding of instructions provided today and declined a print copy of patient  instruction materials.   Plan:  - Will outreach as previously scheduled  [name], PharmD, [place], [place] Pharmacist [place] [phone]

## 2020-02-10 NOTE — Chronic Care Management (AMB) (Signed)
Chronic Care Management   Follow Up Note   02/10/2020 Name: Kelly [name] MRN: [id number] DOB: [birth year]  Referred by: [name], MD Reason for referral : Chronic Care Management (Medication Management)   Kelly [name] is a 79 y.o. year old female who is a primary care patient of Caryl Bis, Angela Adam, MD. The CCM team was consulted for assistance with chronic disease management and care coordination needs.    Received call from patient for medication management support.  Review of patient status, including review of consultants reports, relevant laboratory and other test results, and collaboration with appropriate care team members and the patient's provider was performed as part of comprehensive patient evaluation and provision of chronic care management services.    SDOH (Social Determinants of Health) assessments performed: No See Care Plan activities for detailed interventions related to [facility])     Outpatient Encounter Medications as of 02/10/2020  Medication Sig Note  . amLODipine (NORVASC) 5 MG tablet Take 1 tablet (5 mg total) by mouth daily.   Marland Kitchen aspirin EC 81 MG tablet Take 81 mg by mouth daily.   Marland Kitchen atorvastatin (LIPITOR) 80 MG tablet TAKE 1 TABLET DAILY   . calcitRIOL (ROCALTROL) 0.25 MCG capsule Take 0.25 mcg by mouth daily.   . Calcium Citrate-Vitamin D (CALCIUM CITRATE +D PO) Take 600 mg by mouth daily.    . Cholecalciferol (VITAMIN D) 50 MCG (2000 UT) CAPS Take 2,000 Units by mouth daily.    . Continuous Blood Gluc Sensor (FREESTYLE LIBRE 2 SENSOR) MISC Use to check sugar at least 4 times daily   . cyclobenzaprine (FLEXERIL) 10 MG tablet TAKE 1/2 TABLET (5MG  TOTAL)3 TIMES A DAY AS NEEDED    FOR MUSCLE SPASMS   . denosumab (PROLIA) 60 MG/ML SOSY injection Inject 60 mg into the skin every 6 (six) months. 09/24/2018: Last dose in April   . docusate sodium (COLACE) 100 MG capsule Take 100 mg by mouth daily.    Marland Kitchen escitalopram (LEXAPRO) 10 MG tablet Take  0.5 tablets (5 mg total) by mouth daily for 14 days, THEN 1 tablet (10 mg total) daily.   . furosemide (LASIX) 40 MG tablet Take 1 tablet (40 mg total) by mouth daily as needed. 12/17/2019: As needed  . gabapentin (NEURONTIN) 100 MG capsule Take 100 mg by mouth 2 (two) times daily. 12/18/2019: QPM  . glucosamine-chondroitin 500-400 MG tablet Take 1 tablet by mouth 3 (three) times daily.   Marland Kitchen glucose blood (ACCU-CHEK AVIVA PLUS) test strip Use to check blood sugar up to 4 times daily   . HYDROcodone-acetaminophen (NORCO) 5-325 MG tablet Norco 5 mg-325 mg tablet  Take 1 tablet twice a day by oral route as needed. 12/18/2019: Never takes more than 1 per day; takes ~3 times per week  . Insulin Glargine (BASAGLAR KWIKPEN) 100 UNIT/ML Inject 14 Units into the skin daily.   . Lancets MISC Use up to 4 times daily to check blood sugars.   Marland Kitchen losartan (COZAAR) 50 MG tablet TAKE 1 TABLET DAILY   . Melatonin 5 MG CHEW Chew 5 mg by mouth daily.   . potassium chloride (K-DUR) 10 MEQ tablet potassium chloride ER 10 mEq tablet,extended release 11/06/2017: Only takes when she takes furosemide  . Semaglutide,0.25 or 0.5MG /DOS, (OZEMPIC, 0.25 OR 0.5 MG/DOSE,) 2 MG/1.5ML SOPN Inject 0.5 mg into the skin once a week.   . [DISCONTINUED] Insulin Glargine (BASAGLAR KWIKPEN) 100 UNIT/ML Inject 18 Units into the skin daily.    No facility-administered  encounter medications on file as of 02/10/2020.     Objective:   Goals Addressed              This Visit's Progress     Patient Stated   .  "I want to work on my diabetes" (pt-stated)        CARE PLAN ENTRY (see longtitudinal plan of care for additional care plan information)  Current Barriers:  . Social, financial, and community barriers:  o Calls today with reports of hypoglycemia . Diabetes: uncontrolled at goal <7.5%; most recent Q9U 7.6%; complicated by CKD . Current antihyperglycemic regimen: Ozempic 0.5 mg weekly, Basaglar 18 units daily  . Current glucose  readings: o Reports episodes of fasting hypoglycemia in 50s . Cardiovascular risk reduction: o Current hypertensive regimen: amlodipine 5 mg QPM, losartan 50 mg QAM o Current hyperlipidemia regimen: atorvastatin 80 mg daily; LDL at goal <70 . Peripheral Neuropathy: gabapentin 100 mg QPM . Osteoporosis: scheduled for Prolia injection in November . Depression: escitalopram 5 mg  . Supplements: calcitriol 0.25 mg daily, vitamin D daily, calcium  Pharmacist Clinical Goal(s):  Marland Kitchen Over the next 90 days, patient with work with PharmD and primary care provider to address optimized management of diabetes  Interventions: . Comprehensive medication review performed, medication list updated in electronic medical record . Interdisciplinary care team collaboration (see longitudinal plan of care) . Reduce Basaglar to 14 units daily. Patient instructed to call me next week if continued episodes of hypoglycemia.   Patient Self Care Activities:  . Patient will check blood glucose at least 4 times daily using CGM and provide at future appointments . Patient will take medications as prescribed . Patient will report any questions or concerns to provider   Please see past updates related to this goal by clicking on the "Past Updates" button in the selected goal          Plan:  - Will outreach as previously scheduled  [name], PharmD, [place], [place] Pharmacist [place] [phone]

## 2020-02-11 DIAGNOSIS — N184 Chronic kidney disease, stage 4 (severe): Secondary | ICD-10-CM | POA: Diagnosis not present

## 2020-02-17 ENCOUNTER — Other Ambulatory Visit: Payer: Self-pay

## 2020-02-18 DIAGNOSIS — I10 Essential (primary) hypertension: Secondary | ICD-10-CM | POA: Diagnosis not present

## 2020-02-18 DIAGNOSIS — N184 Chronic kidney disease, stage 4 (severe): Secondary | ICD-10-CM | POA: Diagnosis not present

## 2020-02-18 DIAGNOSIS — E1122 Type 2 diabetes mellitus with diabetic chronic kidney disease: Secondary | ICD-10-CM | POA: Diagnosis not present

## 2020-02-18 DIAGNOSIS — R600 Localized edema: Secondary | ICD-10-CM | POA: Diagnosis not present

## 2020-02-18 DIAGNOSIS — N2581 Secondary hyperparathyroidism of renal origin: Secondary | ICD-10-CM | POA: Diagnosis not present

## 2020-02-19 ENCOUNTER — Ambulatory Visit (INDEPENDENT_AMBULATORY_CARE_PROVIDER_SITE_OTHER): Payer: Medicare Other | Admitting: Pharmacist

## 2020-02-19 ENCOUNTER — Other Ambulatory Visit: Payer: Self-pay

## 2020-02-19 DIAGNOSIS — F32A Depression, unspecified: Secondary | ICD-10-CM

## 2020-02-19 DIAGNOSIS — N184 Chronic kidney disease, stage 4 (severe): Secondary | ICD-10-CM | POA: Diagnosis not present

## 2020-02-19 DIAGNOSIS — F419 Anxiety disorder, unspecified: Secondary | ICD-10-CM | POA: Diagnosis not present

## 2020-02-19 DIAGNOSIS — E1122 Type 2 diabetes mellitus with diabetic chronic kidney disease: Secondary | ICD-10-CM | POA: Diagnosis not present

## 2020-02-19 DIAGNOSIS — Z794 Long term (current) use of insulin: Secondary | ICD-10-CM | POA: Diagnosis not present

## 2020-02-19 MED ORDER — BASAGLAR KWIKPEN 100 UNIT/ML ~~LOC~~ SOPN: 10.0000 [IU] | PEN_INJECTOR | Freq: Every day | SUBCUTANEOUS | 3 refills | Status: DC

## 2020-02-19 NOTE — Patient Instructions (Addendum)
Kelly [name],   It was great talking with you! I am SO PROUD of your hard work!  Decrease Basaglar to 10 units daily. Continue Ozempic 0.5 mg weekly.   I'm going to ask two of my teammates to give you a call - a nurse case manager and a Education officer, museum (who is a Transport planner). The three of [place] work with Dr. Caryl Bis to follow along with you through telephone calls to help support your health.   We're here for you!  [name], PharmD [phone]  Visit Information  Goals Addressed              This Visit's Progress     Patient Stated   .  "I want to work on my diabetes" (pt-stated)        CARE PLAN ENTRY (see longtitudinal plan of care for additional care plan information)  Current Barriers:  . Social, financial, and community barriers:  o Notes significant worrying lately. Worries about sudden renal decline, worries about end of life decisions. Tearful during our visit today. Denies SI/HI. Notes her daughter often reminds her that if she makes poor health decisions, she will be placed on dialysis. Denies having a support system to talk with . Diabetes: uncontrolled at goal <7.5%; most recent G2X 5.2%; complicated by CKD . Current antihyperglycemic regimen: Ozempic 0.5 mg weekly, Basaglar 14 units daily  o Does endorse reduction in appetite since starting Ozempic, however, does not note that this is negatively impactful to quality of life. . Current glucose readings: Date of Download: 02/06/20-02/19/20 % Time CGM is active: 92% Average Glucose: 136 mg/dL Glucose Management Indicator: 6.6%  Glucose Variability: 35.4% (goal <36%) Time in Goal:  - Time in range 70-180: 18% - Time above range: 79% - Time below range: 3% Observed patterns: basal hypoglycemia, continued post-prandial elevations.  . Cardiovascular risk reduction: o Current hypertensive regimen: amlodipine 5 mg QPM, losartan 50 mg QAM o Current hyperlipidemia regimen: atorvastatin 80 mg daily; LDL at goal  <70 . Peripheral Neuropathy: gabapentin 100 mg QPM . Osteoporosis: scheduled for Prolia injection in November . Depression: escitalopram 10 mg daily - reports more sleepiness since being on this medication, in comparison to paroxetine. Also, upon review, feels like her worrying has been worse since switching to escitalopram. Denies SI/HI . Supplements: calcitriol 0.25 mg daily, vitamin D daily, calcium  Pharmacist Clinical Goal(s):  Marland Kitchen Over the next 90 days, patient with work with PharmD and primary care provider to address optimized management of diabetes  Interventions: . Comprehensive medication review performed, medication list updated in electronic medical record . Interdisciplinary care team collaboration (see longitudinal plan of care) . Reduce Basaglar to 10 units daily. Continue Ozempic 0.5 mg weekly. Praised for improvement in glucose readings. Encouraged patient to match up meal diary with elevated post prandial elevations and consider how to modify meals to reduce carbohydrate content (ie, 1 tomato sandwich instead of 2) . Provided empathetic listening as patient discussed her health worries. She is amenable to RN CM and LCSW referral for disease management education and mental health support.  . Scheduled f/u with Dr. Caryl Bis for soonest available, 03/23/20. If anxieties have worsened since start of escitalopram, recommend change to another SSRI. Encouraged to continue for now to avoid abrupt discontinuation. Advised to seek immediate medical treatment if development of SI/HI  Patient Self Care Activities:  . Patient will check blood glucose at least 4 times daily using CGM and provide at future appointments . Patient will take medications  as prescribed . Patient will report any questions or concerns to provider   Please see past updates related to this goal by clicking on the "Past Updates" button in the selected goal         Print copy of patient instructions provided.    - Will collaborate w/ PCP regarding f/u appt plan.  - Scheduled f/u call with me in ~4 weeks  [name], PharmD, [place], [place] Pharmacist [place] [phone]

## 2020-02-19 NOTE — Chronic Care Management (AMB) (Signed)
Chronic Care Management   Follow Up Note   02/19/2020 Name: Kelly [name] MRN: [id number] DOB: February 14, 1941  Referred by: [name], MD Reason for referral : Chronic Care Management (Medication Management)   Kelly [name] is a 79 y.o. year old female who is a primary care patient of Caryl Bis, Angela Adam, MD. The CCM team was consulted for assistance with chronic disease management and care coordination needs.    Met with patient face to face for medication management review.   Review of patient status, including review of consultants reports, relevant laboratory and other test results, and collaboration with appropriate care team members and the patient's provider was performed as part of comprehensive patient evaluation and provision of chronic care management services.    SDOH (Social Determinants of Health) assessments performed: Yes See Care Plan activities for detailed interventions related to SDOH)  SDOH Interventions     Most Recent Value  SDOH Interventions  Stress Interventions Other (Comment), Provide Counseling  [f/u with PCP scheduled]       Outpatient Encounter Medications as of 02/19/2020  Medication Sig Note   amLODipine (NORVASC) 5 MG tablet Take 1 tablet (5 mg total) by mouth daily.    aspirin EC 81 MG tablet Take 81 mg by mouth daily.    atorvastatin (LIPITOR) 80 MG tablet TAKE 1 TABLET DAILY    calcitRIOL (ROCALTROL) 0.25 MCG capsule Take 0.25 mcg by mouth daily.    Calcium Citrate-Vitamin D (CALCIUM CITRATE +D PO) Take 600 mg by mouth daily.     Cholecalciferol (VITAMIN D) 50 MCG (2000 UT) CAPS Take 2,000 Units by mouth daily.     Continuous Blood Gluc Sensor (FREESTYLE LIBRE 2 SENSOR) MISC Use to check sugar at least 4 times daily    cyclobenzaprine (FLEXERIL) 10 MG tablet TAKE 1/2 TABLET (5MG TOTAL)3 TIMES A DAY AS NEEDED    FOR MUSCLE SPASMS    denosumab (PROLIA) 60 MG/ML SOSY injection Inject 60 mg into the skin every 6 (six)  months. 09/24/2018: Last dose in April    escitalopram (LEXAPRO) 10 MG tablet Take 0.5 tablets (5 mg total) by mouth daily for 14 days, THEN 1 tablet (10 mg total) daily.    furosemide (LASIX) 40 MG tablet Take 1 tablet (40 mg total) by mouth daily as needed. 12/17/2019: As needed   gabapentin (NEURONTIN) 100 MG capsule Take 100 mg by mouth 2 (two) times daily. 12/18/2019: QPM   glucosamine-chondroitin 500-400 MG tablet Take 1 tablet by mouth 3 (three) times daily.    glucose blood (ACCU-CHEK AVIVA PLUS) test strip Use to check blood sugar up to 4 times daily    Insulin Glargine (BASAGLAR KWIKPEN) 100 UNIT/ML Inject 10 Units into the skin daily.    Lancets MISC Use up to 4 times daily to check blood sugars.    losartan (COZAAR) 50 MG tablet TAKE 1 TABLET DAILY    Melatonin 5 MG CHEW Chew 5 mg by mouth daily.    potassium chloride (K-DUR) 10 MEQ tablet potassium chloride ER 10 mEq tablet,extended release 11/06/2017: Only takes when she takes furosemide   Semaglutide,0.25 or 0.5MG/DOS, (OZEMPIC, 0.25 OR 0.5 MG/DOSE,) 2 MG/1.5ML SOPN Inject 0.5 mg into the skin once a week.    [DISCONTINUED] Insulin Glargine (BASAGLAR KWIKPEN) 100 UNIT/ML Inject 14 Units into the skin daily.    docusate sodium (COLACE) 100 MG capsule Take 100 mg by mouth daily.     HYDROcodone-acetaminophen (NORCO) 5-325 MG tablet Norco 5 mg-325 mg  tablet  Take 1 tablet twice a day by oral route as needed. 12/18/2019: Never takes more than 1 per day; takes ~3 times per week   No facility-administered encounter medications on file as of 02/19/2020.     Objective:    Goals Addressed              This Visit's Progress     Patient Stated     "I want to work on my diabetes" (pt-stated)        [place] (see longtitudinal plan of care for additional care plan information)  Current Barriers:   Social, financial, and community barriers:  o Notes significant worrying lately. Worries about sudden renal  decline, worries about end of life decisions. Tearful during our visit today. Denies SI/HI. Notes her daughter often reminds her that if she makes poor health decisions, she will be placed on dialysis. Denies having a support system to talk with  Diabetes: uncontrolled at goal <7.5%; most recent V4Q 5.9%; complicated by CKD  Current antihyperglycemic regimen: Ozempic 0.5 mg weekly, Basaglar 14 units daily  o Does endorse reduction in appetite since starting Ozempic, however, does not note that this is negatively impactful to quality of life.  Current glucose readings: Date of Download: 02/06/20-02/19/20 % Time CGM is active: 92% Average Glucose: 136 mg/dL Glucose Management Indicator: 6.6%  Glucose Variability: 35.4% (goal <36%) Time in Goal:  - Time in range 70-180: 18% - Time above range: 79% - Time below range: 3% Observed patterns: basal hypoglycemia, continued post-prandial elevations.   Cardiovascular risk reduction: o Current hypertensive regimen: amlodipine 5 mg QPM, losartan 50 mg QAM o Current hyperlipidemia regimen: atorvastatin 80 mg daily; LDL at goal <70  Peripheral Neuropathy: gabapentin 100 mg QPM  Osteoporosis: scheduled for Prolia injection in November  Depression: escitalopram 10 mg daily - reports more sleepiness since being on this medication, in comparison to paroxetine. Also, upon review, feels like her worrying has been worse since switching to escitalopram. Denies SI/HI  Supplements: calcitriol 0.25 mg daily, vitamin D daily, calcium  Pharmacist Clinical Goal(s):   Over the next 90 days, patient with work with PharmD and primary care provider to address optimized management of diabetes  Interventions:  Comprehensive medication review performed, medication list updated in electronic medical record  Interdisciplinary care team collaboration (see longitudinal plan of care)  Reduce Basaglar to 10 units daily. Continue Ozempic 0.5 mg weekly. Praised for  improvement in glucose readings. Encouraged patient to match up meal diary with elevated post prandial elevations and consider how to modify meals to reduce carbohydrate content (ie, 1 tomato sandwich instead of 2)  Provided empathetic listening as patient discussed her health worries. She is amenable to RN CM and LCSW referral for disease management education and mental health support.   Scheduled f/u with Dr. Caryl Bis for soonest available, 03/23/20. If anxieties have worsened since start of escitalopram, recommend change to another SSRI. Encouraged to continue for now to avoid abrupt discontinuation. Advised to seek immediate medical treatment if development of SI/HI  Patient Self Care Activities:   Patient will check blood glucose at least 4 times daily using CGM and provide at future appointments  Patient will take medications as prescribed  Patient will report any questions or concerns to provider   Please see past updates related to this goal by clicking on the "Past Updates" button in the selected goal          Plan:  - Will collaborate w/  PCP regarding f/u appt plan.  - Scheduled f/u call with me in ~4 weeks  [name], PharmD, [place], [place] Pharmacist [place] [phone]

## 2020-02-21 ENCOUNTER — Ambulatory Visit: Payer: Medicare Other | Admitting: Pharmacist

## 2020-02-21 DIAGNOSIS — Z794 Long term (current) use of insulin: Secondary | ICD-10-CM

## 2020-02-21 DIAGNOSIS — N184 Chronic kidney disease, stage 4 (severe): Secondary | ICD-10-CM

## 2020-02-21 DIAGNOSIS — E1122 Type 2 diabetes mellitus with diabetic chronic kidney disease: Secondary | ICD-10-CM

## 2020-02-21 MED ORDER — OZEMPIC (0.25 OR 0.5 MG/DOSE) 2 MG/1.5ML ~~LOC~~ SOPN: 0.5000 mg | PEN_INJECTOR | SUBCUTANEOUS | 3 refills | Status: DC

## 2020-02-21 NOTE — Chronic Care Management (AMB) (Signed)
Chronic Care Management   Follow Up Note   02/21/2020 Name: Kelly [name] MRN: [id number] DOB: 12-10-1940  Referred by: [name], MD Reason for referral : Chronic Care Management (Medication Management)   Kelly [name] is a 79 y.o. year old female who is a primary care patient of Caryl Bis, Angela Adam, MD. The CCM team was consulted for assistance with chronic disease management and care coordination needs.    Received call from patient with medication access concerns.   Review of patient status, including review of consultants reports, relevant laboratory and other test results, and collaboration with appropriate care team members and the patient's provider was performed as part of comprehensive patient evaluation and provision of chronic care management services.    SDOH (Social Determinants of Health) assessments performed: No See Care Plan activities for detailed interventions related to [facility])     Outpatient Encounter Medications as of 02/21/2020  Medication Sig Note   amLODipine (NORVASC) 5 MG tablet Take 1 tablet (5 mg total) by mouth daily.    aspirin EC 81 MG tablet Take 81 mg by mouth daily.    atorvastatin (LIPITOR) 80 MG tablet TAKE 1 TABLET DAILY    calcitRIOL (ROCALTROL) 0.25 MCG capsule Take 0.25 mcg by mouth daily.    Calcium Citrate-Vitamin D (CALCIUM CITRATE +D PO) Take 600 mg by mouth daily.     Cholecalciferol (VITAMIN D) 50 MCG (2000 UT) CAPS Take 2,000 Units by mouth daily.     Continuous Blood Gluc Sensor (FREESTYLE LIBRE 2 SENSOR) MISC Use to check sugar at least 4 times daily    cyclobenzaprine (FLEXERIL) 10 MG tablet TAKE 1/2 TABLET (5MG  TOTAL)3 TIMES A DAY AS NEEDED    FOR MUSCLE SPASMS    denosumab (PROLIA) 60 MG/ML SOSY injection Inject 60 mg into the skin every 6 (six) months. 09/24/2018: Last dose in April    docusate sodium (COLACE) 100 MG capsule Take 100 mg by mouth daily.     escitalopram (LEXAPRO) 10 MG tablet Take  0.5 tablets (5 mg total) by mouth daily for 14 days, THEN 1 tablet (10 mg total) daily.    furosemide (LASIX) 40 MG tablet Take 1 tablet (40 mg total) by mouth daily as needed. 12/17/2019: As needed   gabapentin (NEURONTIN) 100 MG capsule Take 100 mg by mouth 2 (two) times daily. 12/18/2019: QPM   glucosamine-chondroitin 500-400 MG tablet Take 1 tablet by mouth 3 (three) times daily.    glucose blood (ACCU-CHEK AVIVA PLUS) test strip Use to check blood sugar up to 4 times daily    HYDROcodone-acetaminophen (NORCO) 5-325 MG tablet Norco 5 mg-325 mg tablet  Take 1 tablet twice a day by oral route as needed. 12/18/2019: Never takes more than 1 per day; takes ~3 times per week   Insulin Glargine (BASAGLAR KWIKPEN) 100 UNIT/ML Inject 10 Units into the skin daily.    Lancets MISC Use up to 4 times daily to check blood sugars.    losartan (COZAAR) 50 MG tablet TAKE 1 TABLET DAILY    Melatonin 5 MG CHEW Chew 5 mg by mouth daily.    potassium chloride (K-DUR) 10 MEQ tablet potassium chloride ER 10 mEq tablet,extended release 11/06/2017: Only takes when she takes furosemide   Semaglutide,0.25 or 0.5MG /DOS, (OZEMPIC, 0.25 OR 0.5 MG/DOSE,) 2 MG/1.5ML SOPN Inject 0.5 mg into the skin once a week.    [DISCONTINUED] Semaglutide,0.25 or 0.5MG /DOS, (OZEMPIC, 0.25 OR 0.5 MG/DOSE,) 2 MG/1.5ML SOPN Inject 0.5 mg into the skin  once a week.    No facility-administered encounter medications on file as of 02/21/2020.     Objective:   Goals Addressed              This Visit's Progress     Patient Stated     "I want to work on my diabetes" (pt-stated)        [place] (see longtitudinal plan of care for additional care plan information)  Current Barriers:   Social, financial, and community barriers:  o Requests a 90 day supply of Ozempic be sent to [facility], as more cost effective than 28 day supplies  Diabetes: uncontrolled at goal <7.5%; most recent U3J 4.9%; complicated by  CKD  Current antihyperglycemic regimen: Ozempic 0.5 mg weekly, Basaglar 10 units daily  Observed patterns: basal hypoglycemia, continued post-prandial elevations.   Cardiovascular risk reduction: o Current hypertensive regimen: amlodipine 5 mg QPM, losartan 50 mg QAM o Current hyperlipidemia regimen: atorvastatin 80 mg daily; LDL at goal <70  Peripheral Neuropathy: gabapentin 100 mg QPM  Osteoporosis: scheduled for Prolia injection in November  Depression: escitalopram 10 mg daily - reports more sleepiness since being on this medication, in comparison to paroxetine. Also, upon review, feels like her worrying has been worse since switching to escitalopram. Denies SI/HI  Supplements: calcitriol 0.25 mg daily, vitamin D daily, calcium  Pharmacist Clinical Goal(s):   Over the next 90 days, patient with work with PharmD and primary care provider to address optimized management of diabetes  Interventions:  3 month supply of Ozempic sent to [place].   Patient Self Care Activities:   Patient will check blood glucose at least 4 times daily using CGM and provide at future appointments  Patient will take medications as prescribed  Patient will report any questions or concerns to provider   Please see past updates related to this goal by clicking on the "Past Updates" button in the selected goal          Plan:  - Will outreach as previously scheduled  [name], PharmD, [place], [place] Pharmacist [place] [phone]

## 2020-02-21 NOTE — Patient Instructions (Signed)
Visit Information  Goals Addressed              This Visit's Progress     Patient Stated   .  "I want to work on my diabetes" (pt-stated)        CARE PLAN ENTRY (see longtitudinal plan of care for additional care plan information)  Current Barriers:  . Social, financial, and community barriers:  o Requests a 90 day supply of Ozempic be sent to [facility], as more cost effective than 28 day supplies . Diabetes: uncontrolled at goal <7.5%; most recent B7C 4.8%; complicated by CKD . Current antihyperglycemic regimen: Ozempic 0.5 mg weekly, Basaglar 10 units daily  Observed patterns: basal hypoglycemia, continued post-prandial elevations.  . Cardiovascular risk reduction: o Current hypertensive regimen: amlodipine 5 mg QPM, losartan 50 mg QAM o Current hyperlipidemia regimen: atorvastatin 80 mg daily; LDL at goal <70 . Peripheral Neuropathy: gabapentin 100 mg QPM . Osteoporosis: scheduled for Prolia injection in November . Depression: escitalopram 10 mg daily - reports more sleepiness since being on this medication, in comparison to paroxetine. Also, upon review, feels like her worrying has been worse since switching to escitalopram. Denies SI/HI . Supplements: calcitriol 0.25 mg daily, vitamin D daily, calcium  Pharmacist Clinical Goal(s):  Marland Kitchen Over the next 90 days, patient with work with PharmD and primary care provider to address optimized management of diabetes  Interventions: . 3 month supply of Ozempic sent to [place].   Patient Self Care Activities:  . Patient will check blood glucose at least 4 times daily using CGM and provide at future appointments . Patient will take medications as prescribed . Patient will report any questions or concerns to provider   Please see past updates related to this goal by clicking on the "Past Updates" button in the selected goal         The patient verbalized understanding of instructions provided today and declined a print copy  of patient instruction materials.   Plan:  - Will outreach as previously scheduled  [name], PharmD, [place], [place] Pharmacist [place] [phone]

## 2020-02-21 NOTE — Addendum Note (Signed)
Addended by: Ezequiel Ganser on: 02/21/2020 02:20 PM   Modules accepted: Orders

## 2020-02-27 ENCOUNTER — Ambulatory Visit: Payer: Medicare Other | Admitting: Pharmacist

## 2020-02-27 ENCOUNTER — Telehealth: Payer: Self-pay | Admitting: Family Medicine

## 2020-02-27 DIAGNOSIS — F32A Depression, unspecified: Secondary | ICD-10-CM

## 2020-02-27 DIAGNOSIS — Z794 Long term (current) use of insulin: Secondary | ICD-10-CM

## 2020-02-27 DIAGNOSIS — N184 Chronic kidney disease, stage 4 (severe): Secondary | ICD-10-CM

## 2020-02-27 DIAGNOSIS — E1122 Type 2 diabetes mellitus with diabetic chronic kidney disease: Secondary | ICD-10-CM

## 2020-02-27 DIAGNOSIS — F419 Anxiety disorder, unspecified: Secondary | ICD-10-CM

## 2020-02-27 NOTE — Progress Notes (Signed)
Patient called to discuss escitalopram. See CCM note. Scheduled for appt w/ PCP on Monday

## 2020-02-27 NOTE — Chronic Care Management (AMB) (Signed)
Chronic Care Management   Follow Up Note   02/27/2020 Name: Kelly [name] MRN: [id number] DOB: [birth year]  Referred by: [name], MD Reason for referral : Chronic Care Management (Medication Management)   Kelly [name] is a 79 y.o. year old female who is a primary care patient of Caryl Bis, Angela Adam, MD. The CCM team was consulted for assistance with chronic disease management and care coordination needs.    Received call from patient regarding medication access.  Review of patient status, including review of consultants reports, relevant laboratory and other test results, and collaboration with appropriate care team members and the patient's provider was performed as part of comprehensive patient evaluation and provision of chronic care management services.    SDOH (Social Determinants of Health) assessments performed: No See Care Plan activities for detailed interventions related to [facility])     Outpatient Encounter Medications as of 02/27/2020  Medication Sig Note  . amLODipine (NORVASC) 5 MG tablet Take 1 tablet (5 mg total) by mouth daily.   Marland Kitchen aspirin EC 81 MG tablet Take 81 mg by mouth daily.   Marland Kitchen atorvastatin (LIPITOR) 80 MG tablet TAKE 1 TABLET DAILY   . calcitRIOL (ROCALTROL) 0.25 MCG capsule Take 0.25 mcg by mouth daily.   . Calcium Citrate-Vitamin D (CALCIUM CITRATE +D PO) Take 600 mg by mouth daily.    . Cholecalciferol (VITAMIN D) 50 MCG (2000 UT) CAPS Take 2,000 Units by mouth daily.    . Continuous Blood Gluc Sensor (FREESTYLE LIBRE 2 SENSOR) MISC Use to check sugar at least 4 times daily   . cyclobenzaprine (FLEXERIL) 10 MG tablet TAKE 1/2 TABLET (5MG  TOTAL)3 TIMES A DAY AS NEEDED    FOR MUSCLE SPASMS   . denosumab (PROLIA) 60 MG/ML SOSY injection Inject 60 mg into the skin every 6 (six) months. 09/24/2018: Last dose in April   . docusate sodium (COLACE) 100 MG capsule Take 100 mg by mouth daily.    Marland Kitchen escitalopram (LEXAPRO) 10 MG tablet Take 0.5  tablets (5 mg total) by mouth daily for 14 days, THEN 1 tablet (10 mg total) daily.   . furosemide (LASIX) 40 MG tablet Take 1 tablet (40 mg total) by mouth daily as needed. 12/17/2019: As needed  . gabapentin (NEURONTIN) 100 MG capsule Take 100 mg by mouth 2 (two) times daily. 12/18/2019: QPM  . glucosamine-chondroitin 500-400 MG tablet Take 1 tablet by mouth 3 (three) times daily.   Marland Kitchen glucose blood (ACCU-CHEK AVIVA PLUS) test strip Use to check blood sugar up to 4 times daily   . HYDROcodone-acetaminophen (NORCO) 5-325 MG tablet Norco 5 mg-325 mg tablet  Take 1 tablet twice a day by oral route as needed. 12/18/2019: Never takes more than 1 per day; takes ~3 times per week  . Insulin Glargine (BASAGLAR KWIKPEN) 100 UNIT/ML Inject 10 Units into the skin daily.   . Lancets MISC Use up to 4 times daily to check blood sugars.   Marland Kitchen losartan (COZAAR) 50 MG tablet TAKE 1 TABLET DAILY   . Melatonin 5 MG CHEW Chew 5 mg by mouth daily.   . potassium chloride (K-DUR) 10 MEQ tablet potassium chloride ER 10 mEq tablet,extended release 11/06/2017: Only takes when she takes furosemide  . Semaglutide,0.25 or 0.5MG /DOS, (OZEMPIC, 0.25 OR 0.5 MG/DOSE,) 2 MG/1.5ML SOPN Inject 0.5 mg into the skin once a week.    No facility-administered encounter medications on file as of 02/27/2020.     Objective:   Goals Addressed  This Visit's Progress     Patient Stated   .  "I want to work on my diabetes" (pt-stated)        CARE PLAN ENTRY (see longtitudinal plan of care for additional care plan information)  Current Barriers:  . Social, financial, and community barriers:  o Calls today to report continued mood concerns. Reports she has been tearful every day since our appointment last week. Denies SI/HI . Diabetes: uncontrolled at goal <7.5%; most recent T0G 2.6%; complicated by CKD . Current antihyperglycemic regimen: Ozempic 0.5 mg weekly, Basaglar 10 units daily . Cardiovascular risk  reduction: o Current hypertensive regimen: amlodipine 5 mg QPM, losartan 50 mg QAM o Current hyperlipidemia regimen: atorvastatin 80 mg daily; LDL at goal <70 . Peripheral Neuropathy: gabapentin 100 mg QPM . Osteoporosis: scheduled for Prolia injection in November . Depression: escitalopram 10 mg daily . Supplements: calcitriol 0.25 mg daily, vitamin D daily, calcium  Pharmacist Clinical Goal(s):  Marland Kitchen Over the next 90 days, patient with work with PharmD and primary care provider to address optimized management of diabetes  Interventions: . Collaborated w/ office staff to work patient in for an appointment on Monday w/ PCP  Patient Self Care Activities:  . Patient will check blood glucose at least 4 times daily using CGM and provide at future appointments . Patient will take medications as prescribed . Patient will report any questions or concerns to provider   Please see past updates related to this goal by clicking on the "Past Updates" button in the selected goal          Plan:  - Will outreach patient as previously scheduled  [name], PharmD, [place], [place] Pharmacist [place] [phone]

## 2020-02-27 NOTE — Patient Instructions (Signed)
Visit Information  Goals Addressed              This Visit's Progress     Patient Stated   .  "I want to work on my diabetes" (pt-stated)        CARE PLAN ENTRY (see longtitudinal plan of care for additional care plan information)  Current Barriers:  . Social, financial, and community barriers:  o Calls today to report continued mood concerns. Reports she has been tearful every day since our appointment last week. Denies SI/HI . Diabetes: uncontrolled at goal <7.5%; most recent H0T 8.8%; complicated by CKD . Current antihyperglycemic regimen: Ozempic 0.5 mg weekly, Basaglar 10 units daily . Cardiovascular risk reduction: o Current hypertensive regimen: amlodipine 5 mg QPM, losartan 50 mg QAM o Current hyperlipidemia regimen: atorvastatin 80 mg daily; LDL at goal <70 . Peripheral Neuropathy: gabapentin 100 mg QPM . Osteoporosis: scheduled for Prolia injection in November . Depression: escitalopram 10 mg daily . Supplements: calcitriol 0.25 mg daily, vitamin D daily, calcium  Pharmacist Clinical Goal(s):  Marland Kitchen Over the next 90 days, patient with work with PharmD and primary care provider to address optimized management of diabetes  Interventions: . Collaborated w/ office staff to work patient in for an appointment on Monday w/ PCP  Patient Self Care Activities:  . Patient will check blood glucose at least 4 times daily using CGM and provide at future appointments . Patient will take medications as prescribed . Patient will report any questions or concerns to provider   Please see past updates related to this goal by clicking on the "Past Updates" button in the selected goal         The patient verbalized understanding of instructions provided today and declined a print copy of patient instruction materials.   Plan:  - Will outreach patient as previously scheduled  [name], PharmD, [place], [place] Pharmacist [place] [phone]

## 2020-02-27 NOTE — Telephone Encounter (Signed)
Patient called and would like to speak to Catie about her medications. 

## 2020-03-02 ENCOUNTER — Encounter: Payer: Self-pay | Admitting: *Deleted

## 2020-03-02 ENCOUNTER — Other Ambulatory Visit: Payer: Self-pay | Admitting: *Deleted

## 2020-03-02 ENCOUNTER — Encounter: Payer: Self-pay | Admitting: Family Medicine

## 2020-03-02 ENCOUNTER — Ambulatory Visit (INDEPENDENT_AMBULATORY_CARE_PROVIDER_SITE_OTHER): Payer: Medicare Other | Admitting: Family Medicine

## 2020-03-02 ENCOUNTER — Other Ambulatory Visit: Payer: Self-pay

## 2020-03-02 VITALS — BP 118/70 | HR 84 | Temp 97.8°F | Ht 63.0 in | Wt 134.8 lb

## 2020-03-02 DIAGNOSIS — F32A Depression, unspecified: Secondary | ICD-10-CM | POA: Diagnosis not present

## 2020-03-02 DIAGNOSIS — F419 Anxiety disorder, unspecified: Secondary | ICD-10-CM

## 2020-03-02 DIAGNOSIS — R300 Dysuria: Secondary | ICD-10-CM | POA: Diagnosis not present

## 2020-03-02 DIAGNOSIS — Z23 Encounter for immunization: Secondary | ICD-10-CM

## 2020-03-02 LAB — POCT URINALYSIS DIPSTICK
Bilirubin, UA: POSITIVE
Blood, UA: NEGATIVE
Glucose, UA: NEGATIVE
Ketones, UA: POSITIVE
Nitrite, UA: POSITIVE
Protein, UA: POSITIVE — AB
Spec Grav, UA: >=1.03 — AB (ref 1.010–1.025)
Urobilinogen, UA: 2 E.U./dL — AB
pH, UA: 6 (ref 5.0–8.0)

## 2020-03-02 LAB — BASIC METABOLIC PANEL
BUN: 17 mg/dL (ref 6–23)
CO2: 30 mEq/L (ref 19–32)
Calcium: 9 mg/dL (ref 8.4–10.5)
Chloride: 103 mEq/L (ref 96–112)
Creatinine, Ser: 2.34 mg/dL — ABNORMAL HIGH (ref 0.40–1.20)
GFR: 19.42 mL/min — ABNORMAL LOW (ref >60.00)
Glucose, Bld: 238 mg/dL — ABNORMAL HIGH (ref 70–99)
Potassium: 3.9 mEq/L (ref 3.5–5.1)
Sodium: 140 mEq/L (ref 135–145)

## 2020-03-02 MED ORDER — CEPHALEXIN 500 MG PO CAPS: 500.0000 mg | ORAL_CAPSULE | Freq: Two times a day (BID) | ORAL | 0 refills | Status: DC

## 2020-03-02 NOTE — Patient Outreach (Signed)
[facility]) Care Management  03/02/2020  Kelly [name] [birth year] [id number]   Kelly [name] Telephone Assessment/Screen for MD referral  Referral Date: 02/19/20 Referral Source: MD referral, Kelly [name] [facility] embedded pharmacy staff  Referral Reason: Disease management education and mental health support      Kelly-I've worked with this patient for ~2 years now on DM, making significant improvements. 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 Tearful during visit today about what she can do to prevent decline. Not much support/anybody to talk to at home besides her daughter, who is causing more worry. Would appreciate diet/disease education, support, and definitely mental health support.  Insurance: blue cross and blue shield    Outreach attempt # 1  Patient is able to verify HIPAA, DOB and address Reviewed and addressed referral to [name] with patient Consent: [facility] RN CM reviewed [facility] services with patient. Patient gave verbal consent for services [name] telephonic RN CM.    diabetes got libre freestyle 2  hgbA1c 08/07/19 =7.6 was 8.3  On 05/21/18 pt wants it to be below 6.5  Once in a while she tests with her accu chek to compare to the [employer] value She reports she was doing 2-3 cbg sticks qd prior to the [place] cbg value range today 115- 119 She reports she has  just finish eating a hot dog, french fries and a ice cream cone "to reward myself after getting my shots" cbg checked during outreach call = 240  She reports also enjoying pepsi at times Healthy better food choices discussed  Walks her dog for exercise primary care provider (PCP) manages her diabetes treatment plan  She reports seeing her ophthalmologist 6 months ago for evaluation and treatment of dry eye concerns- wears glasses Goal Happy to keep cbg down   Received both the flu and moderna covid booster shot today She was encouraged to monitor the sites and apply ice or  take preferred pain reliever prn  Kelly [name],her daughter per pt does not want the covid shot Pt reports having daughter to follow the covid precautions   Hypertension (HTN) is being monitored at home and she reports managed with medicine and MD visits  Stage 4 kidney disease/UTI today she was seen by her pcp and diagnosed with an urinary tract infection (UTI) She was started on antibiotics She is aware of symptoms starting last night to include urgency, difficulty voiding with burning Urinary hygiene, hydration discussed  Dr Candiss Norse nephrologist informed her of her stage 4 kidney disease, coping discussed, positive speaking and thinking encouraged She reports she likes to drink lots of fluid    EMMI education sent to her verified e-mail address to include chronic kidney disease, diabetes: nutrition and healthy eating, urinary tract infections in adults, making healthy choices when you eat out, making healthy choices when you grocery shop   Social: Kelly [name] reports her home companion is her 120 lb female dog she walks frequently for exercise    Past Medical History:  Diagnosis Date  . Colon polyps   . Depression   . DM (diabetes mellitus), type 2 with renal complications ([place])   . Hyperlipidemia   . Hypertension   . Kidney stones   . Sleep apnea   . 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    Never smoker or drinker  DME: Freestyle Libre cbg meter- accu chek cbg meter, BP cuff, eye glasses   Plan: [facility] RN CM will follow  up with Kelly [name] within the next 14-21 business days Pt encouraged to return a call to [facility] RN CM prn Routed note to MD Goals Addressed              This Visit's Progress     Patient Stated   .  [name]) Eat Healthy (pt-stated)        Follow Up Date 03/16/20   - drink 6 to 8 glasses of water each day - set a realistic goal    Why is this important?   When you are ready to manage your nutrition or weight, having a plan and setting  goals will help.  Taking small steps to change how you eat and exercise is a good place to start.    Notes:     .  [name]) Follow My Treatment Plan (pt-stated)        Follow Up Date 03/16/20   - ask for help if I can't afford my medicines - call the doctor or nurse to get help with side effects - keep follow-up appointments    Why is this important?   Staying as healthy as you can is very important. This may mean making changes if you smoke, don't exercise or eat poorly.  A healthy lifestyle is an important goal for you.  Following the treatment plan and making changes may be hard.  Try some of these steps to help keep the disease from getting worse.     Notes:     .  ([facility]) Monitor and Manage My Blood Sugar (pt-stated)        Follow Up Date 03/16/20   - check blood sugar at prescribed times - check blood sugar if I feel it is too high or too low    Why is this important?   Checking your blood sugar at home helps to keep it from getting very high or very low.  Writing the results in a diary or log helps the doctor know how to care for you.  Your blood sugar log should have the time, date and the results.  Also, write down the amount of insulin or other medicine that you take.  Other information, like what you ate, exercise done and how you were feeling, will also be helpful.     Notes:         Devonia Farro L. Lavina Hamman, RN, BSN, [place] Coordinator Office number [phone] Mobile number [phone]  Main [facility] number [phone] Fax number [phone]

## 2020-03-02 NOTE — Assessment & Plan Note (Signed)
Improving now that she decreased the dose of her Lexapro.  She will remain on Lexapro 5 mg once daily and monitor her symptoms.  If her depression does not continue to improve or if she has any worsening symptoms of depression she will let us know right away and we could consider switching the medication to an alternative medication.

## 2020-03-02 NOTE — Assessment & Plan Note (Addendum)
Urinalysis and symptoms are concerning for UTI.  We will treat with Keflex that has been renally dosed.  If her symptoms are not improving she will let us know.  She only provided a small amount of urine and thus we will try to get this cultured if possible.  We will also check her kidney function with a BMP given decreased urine output.

## 2020-03-02 NOTE — Patient Instructions (Signed)
Nice to see you. Please continue the Lexapro 5 mg once daily. We will treat you with Keflex for a UTI.  If your symptoms are not improving on this please let us know.

## 2020-03-02 NOTE — Progress Notes (Signed)
  Tommi Rumps, MD Phone: [phone]  Kelly [name] is a 79 y.o. female who presents today for f/u.  UTI: Dysuria- yes Frequency- no  Urgency- yes  Hematuria- no  Abd pain- no  Vaginal d/c- no History of similar symptoms with UTI.  Depression: Patient reports this started after going on to the 10 mg dose of the Lexapro.  She felt like she wanted to sleep all day.  She was crying and felt depressed.  Last week she decrease the dose of Lexapro to 5 mg once daily and notes her symptoms have improved on that dose.  She feels as though her depression is improving.  No anxiety.  No SI or HI.   Social History   Tobacco Use  Smoking Status Never Smoker  Smokeless Tobacco Never Used     ROS see history of present illness  Objective  Physical Exam Vitals:   03/02/20 1053  BP: 118/70  Pulse: 84  Temp: 97.8 F (36.6 C)  SpO2: 95%    BP Readings from Last 3 Encounters:  03/02/20 118/70  01/01/20 116/78  12/02/19 (!) 130/70   Wt Readings from Last 3 Encounters:  03/02/20 134 lb 12.8 oz (61.1 kg)  01/01/20 139 lb 6.4 oz (63.2 kg)  12/17/19 140 lb 6.4 oz (63.7 kg)    Physical Exam Constitutional:      General: She is not in acute distress.    Appearance: She is not ill-appearing.  Abdominal:     General: Bowel sounds are normal. There is no distension.     Palpations: Abdomen is soft.     Tenderness: There is no abdominal tenderness. There is no guarding or rebound.  Neurological:     Mental Status: She is alert.      Assessment/Plan: Please see individual problem list.  Problem List Items Addressed This Visit    Anxiety and depression    Improving now that she decreased the dose of her Lexapro.  She will remain on Lexapro 5 mg once daily and monitor her symptoms.  If her depression does not continue to improve or if she has any worsening symptoms of depression she will let us know right away and we could consider switching the medication to an alternative  medication.      Dysuria - Primary    Urinalysis and symptoms are concerning for UTI.  We will treat with Keflex that has been renally dosed.  If her symptoms are not improving she will let us know.  She only provided a small amount of urine and thus we will try to get this cultured if possible.  We will also check her kidney function with a BMP given decreased urine output.      Relevant Medications   cephALEXin (KEFLEX) 500 MG capsule   Other Relevant Orders   POCT Urinalysis Dipstick (Completed)   Urine Culture   Basic Metabolic Panel (BMET)    Other Visit Diagnoses    Need for immunization against influenza       Relevant Orders   Flu Vaccine QUAD High Dose(Fluad) (Completed)       This visit occurred during the SARS-CoV-2 public health emergency.  Safety protocols were in place, including screening questions prior to the visit, additional usage of staff PPE, and extensive cleaning of exam room while observing appropriate contact time as indicated for disinfecting solutions.    Tommi Rumps, MD [place]

## 2020-03-04 ENCOUNTER — Other Ambulatory Visit: Payer: Self-pay | Admitting: Family Medicine

## 2020-03-04 ENCOUNTER — Other Ambulatory Visit: Payer: Self-pay

## 2020-03-04 DIAGNOSIS — I10 Essential (primary) hypertension: Secondary | ICD-10-CM

## 2020-03-04 DIAGNOSIS — E785 Hyperlipidemia, unspecified: Secondary | ICD-10-CM

## 2020-03-04 LAB — URINE CULTURE
MICRO NUMBER:: 11113509
SPECIMEN QUALITY:: ADEQUATE

## 2020-03-04 MED ORDER — FUROSEMIDE 40 MG PO TABS: 40.0000 mg | ORAL_TABLET | Freq: Every day | ORAL | 0 refills | Status: DC | PRN

## 2020-03-09 ENCOUNTER — Telehealth: Payer: Self-pay | Admitting: Pharmacist

## 2020-03-09 NOTE — Progress Notes (Signed)
Patient requests refill on gabapentin 100 mg capsules. Previously prescribed by Dr. Melrose Nakayama. Routing to PCP

## 2020-03-10 ENCOUNTER — Encounter: Payer: Self-pay | Admitting: Family Medicine

## 2020-03-10 ENCOUNTER — Other Ambulatory Visit: Payer: Self-pay

## 2020-03-10 ENCOUNTER — Ambulatory Visit (INDEPENDENT_AMBULATORY_CARE_PROVIDER_SITE_OTHER): Payer: Medicare Other

## 2020-03-10 DIAGNOSIS — M810 Age-related osteoporosis without current pathological fracture: Secondary | ICD-10-CM | POA: Diagnosis not present

## 2020-03-10 MED ORDER — DENOSUMAB 60 MG/ML ~~LOC~~ SOSY
60.0000 mg | PREFILLED_SYRINGE | Freq: Once | SUBCUTANEOUS | Status: AC
Start: 2020-03-10 — End: 2020-03-10
  Administered 2020-03-10: 60 mg via SUBCUTANEOUS

## 2020-03-10 NOTE — Progress Notes (Addendum)
Patient presented for 6-month Prolia injection SQ to left arm. Patient tolerated well. 

## 2020-03-16 ENCOUNTER — Other Ambulatory Visit: Payer: Self-pay

## 2020-03-16 ENCOUNTER — Other Ambulatory Visit: Payer: Self-pay | Admitting: *Deleted

## 2020-03-16 NOTE — Patient Outreach (Signed)
[facility]) Care Management  03/16/2020  Kelly [name] [birth year] [id number]   [facility] Telephone Assessment/Screen for MD referral  Referral Date: 02/19/20 Referral Source: MD referral, [name] [facility] embedded pharmacy staff  Referral Reason: Disease management education and mental health support Catie-I've worked with this patient for ~2 years now on DM, making significant improvements. 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 Tearful during visit today about what she can do to prevent decline. Not much support/anybody to talk to at home besides her daughter, who is causing more worry. Would appreciate diet/disease education, support, and definitely mental health support.  Insurance: blue cross and blue shield    Outreach successful Patient is able to verify HIPAA ([place] and Accountability Act) identifiers Reviewed and addressed the purpose of the follow up call with the patient  Consent: [facility] RN CM reviewed [facility] services with patient. Patient gave verbal consent for services.    Follow up assessment Diabetes Mrs Koffler continues to report her cbg values are staying close to 120s  Reviewed her last HgA1c of 7.6 in March 2021 with her  She anticipates it being < 7 with the next lab check She was commended for her hard work  Chronic Kidney disease (CKD) Assessed her barrier She reports she does not feel she is as active as she could be She still walks her dog once a day  She was encouraged to increase activity in the home like walking, foot taps, anything to remain active She was challenged to increase her steps each day. Trackers and keeping a journal discussed She states she will follow up with use of a tracker (phone or purchase)  Reviewed CKD symptoms, action plan, glomerular filtration rate (GFR)., fluid intake self care, activity, diet, medicines, blood  pressure (BP), iron, edema She denies trouble with fluid retention She states she has minimal edema She voiced concern of not knowing or recognizing any abnormal  signs and symptoms (s/s) except her MD outreached to her about her abnormal kidney functioning values     [facility] RN CM will follow up with Mrs Mcneely in the next 14-21 business days Pt encouraged to return a call to [facility] RN CM prn Routed note to MD  Goals Addressed              This Visit's Progress     Patient Stated   .  [facility]) Become More Active (pt-stated)   Not on track     Follow Up Date 03/30/20   - choose a type of activity I enjoy such as biking, gardening, team sports, walking - keep track of how long I exercise    Notes:    .  ([place]) Eat Healthy (pt-stated)   On track     Follow Up Date 03/30/20   - drink 6 to 8 glasses of water each day - set a realistic goal   Notes:    .  [facility]) Follow My Treatment Plan (pt-stated)   On track     Follow Up Date 03/30/20   - ask for help if I can't afford my medicines - avoid negative self-talk - call the doctor or nurse to get help with side effects - keep follow-up appointments   Notes:    .  [facility]) Monitor and Manage My Blood Sugar (pt-stated)   On track     Follow Up Date 03/30/20   - check blood sugar at prescribed times -  check blood sugar if I feel it is too high or too low   Notes:        Shaida Route L. Lavina Hamman, RN, BSN, [place] Coordinator Office number [phone] Main [facility] number [phone] Fax number [phone]

## 2020-03-20 ENCOUNTER — Telehealth: Payer: Medicare Other

## 2020-03-20 ENCOUNTER — Ambulatory Visit: Payer: Medicare Other | Admitting: Pharmacist

## 2020-03-20 DIAGNOSIS — F32A Depression, unspecified: Secondary | ICD-10-CM

## 2020-03-20 DIAGNOSIS — M810 Age-related osteoporosis without current pathological fracture: Secondary | ICD-10-CM

## 2020-03-20 DIAGNOSIS — N184 Chronic kidney disease, stage 4 (severe): Secondary | ICD-10-CM

## 2020-03-20 DIAGNOSIS — I10 Essential (primary) hypertension: Secondary | ICD-10-CM

## 2020-03-20 DIAGNOSIS — F419 Anxiety disorder, unspecified: Secondary | ICD-10-CM

## 2020-03-20 DIAGNOSIS — E1122 Type 2 diabetes mellitus with diabetic chronic kidney disease: Secondary | ICD-10-CM

## 2020-03-20 NOTE — Chronic Care Management (AMB) (Signed)
Chronic Care Management   Pharmacy Note  03/20/2020 Name: Kelly [name] MRN: [id number] DOB: [birth year]   Subjective:  Kelly [name] is a 79 y.o. year old female who is a primary care patient of Caryl Bis, Angela Adam, MD. The CCM team was consulted for assistance with chronic disease management and care coordination needs.    Engaged with patient by telephone for follow up visit in response to provider referral for pharmacy case management and/or care coordination services.   Consent to Services:  [name] @ was given information about Chronic Care Management services, agreed to services, and gave verbal consent prior to initiation of services on 11/15/2018. Please see initial visit note for detailed documentation.   Review of patient status, including review of consultants reports, laboratory and other test data, was performed as part of comprehensive evaluation and provision of chronic care management services.   SDOH (Social Determinants of Health) assessments and interventions performed:  SDOH Interventions     Most Recent Value  SDOH Interventions  Depression Interventions/Treatment  Medication, PHQ2-9 Score <4 Follow-up Not Indicated       Objective:  Lab Results  Component Value Date   CREATININE 2.34 (H) 03/02/2020   CREATININE 2.68 (H) 10/20/2019   CREATININE 1.98 (H) 08/07/2019    Lab Results  Component Value Date   HGBA1C 7.6 (H) 08/07/2019       Component Value Date/Time   CHOL 107 04/22/2019 1055   TRIG 168.0 (H) 04/22/2019 1055   HDL 27.40 (L) 04/22/2019 1055   CHOLHDL 4 04/22/2019 1055   VLDL 33.6 04/22/2019 1055   LDLCALC 46 04/22/2019 1055   LDLDIRECT 78.0 02/18/2019 0857     Clinical ASCVD: No  The ASCVD Risk score (Goff DC Jr., et al., 2013) failed to calculate for the following reasons:   The valid total cholesterol range is 130 to 320 mg/dL    Depression screen [facility] 2/9 03/20/2020 03/20/2020 03/02/2020  Decreased Interest 0 1 0    Down, Depressed, Hopeless 1 0 1  PHQ - 2 Score 1 1 1   Altered sleeping 2 - -  Tired, decreased energy 0 - -  Change in appetite 0 - -  Feeling bad or failure about yourself  0 - -  Trouble concentrating 0 - -  Moving slowly or fidgety/restless 0 - -  Suicidal thoughts 0 - -  PHQ-9 Score 3 - -  Difficult doing work/chores Somewhat difficult - -     BP Readings from Last 3 Encounters:  03/02/20 118/70  01/01/20 116/78  12/02/19 (!) 130/70    Assessment:   Allergies  Allergen Reactions  . Prilosec Otc [Omeprazole Magnesium] Other (See Comments)    Maybe cause of acute interstitial nephritis  . Sucralfate Other (See Comments)    Maybe cause of acute interstitial nephritis  . Amoxicillin Diarrhea  . Morphine And Related Other (See Comments)    Chest pains Chest pains    Medications Reviewed Today    Reviewed by [name], RPH-CPP (Pharmacist) on 03/20/20 at 1151  Med List Status: <None>  Medication Order Taking? Sig Documenting Provider Last Dose Status Informant  amLODipine (NORVASC) 5 MG tablet [id number] Yes Take 1 tablet (5 mg total) by mouth daily. [name], MD Taking Active   aspirin EC 81 MG tablet [id number] Yes Take 81 mg by mouth daily. [provider] Taking Active Multiple Informants  atorvastatin (LIPITOR) 80 MG tablet [id number] Yes TAKE 1 TABLET DAILY [name], MD Taking  Active   calcitRIOL (ROCALTROL) 0.25 MCG capsule [id number] Yes Take 0.25 mcg by mouth daily. [provider] Taking Active   Calcium Citrate-Vitamin D (CALCIUM CITRATE +D PO) [id number] Yes Take 600 mg by mouth daily.  [provider] Taking Active   Cholecalciferol (VITAMIN D) 50 MCG (2000 UT) CAPS [id number] Yes Take 2,000 Units by mouth daily.  [provider] Taking Active   Continuous Blood Gluc Sensor (FREESTYLE LIBRE 2 SENSOR) [place] [id number] Yes Use to check sugar at least 4 times daily [name], MD Taking Active    cyclobenzaprine (FLEXERIL) 10 MG tablet [id number] Yes TAKE 1/2 TABLET (5MG  TOTAL)3 TIMES A DAY AS NEEDED    FOR MUSCLE SPASMS [name], MD Taking Active   denosumab (PROLIA) 60 MG/ML SOSY injection [id number] Yes Inject 60 mg into the skin every 6 (six) months. [provider] Taking Active            Med Note Erling Cruz, Adria Dill Sep 24, 2018  1:07 PM) Last dose in April   docusate sodium (COLACE) 100 MG capsule [id number] Yes Take 100 mg by mouth daily.  [provider] Taking Active   escitalopram (LEXAPRO) 10 MG tablet [id number] Yes Take 0.5 tablets (5 mg total) by mouth daily for 14 days, THEN 1 tablet (10 mg total) daily. [name], MD Taking Active            Med Note [name], Arville Lime   Fri Mar 20, 2020 11:49 AM) Taking 5 mg daily  furosemide (LASIX) 40 MG tablet [id number] Yes Take 1 tablet (40 mg total) by mouth daily as needed. [name], MD Taking Active   gabapentin (NEURONTIN) 100 MG capsule [id number] Yes Take 100 mg by mouth 2 (two) times daily. [provider] Taking Active            Med Note [name], Arville Lime   Fri Mar 20, 2020 11:50 AM)    glucosamine-chondroitin 500-400 MG tablet [id number] Yes Take 1 tablet by mouth 3 (three) times daily. [provider] Taking Active   glucose blood (ACCU-CHEK AVIVA PLUS) test strip [id number] Yes Use to check blood sugar up to 4 times daily [name], MD Taking Active   HYDROcodone-acetaminophen [facility]) 5-325 MG tablet [id number] Yes Norco 5 mg-325 mg tablet  Take 1 tablet twice a day by oral route as needed. [provider] Taking Active            Med Note [name], [name] Dec 18, 2019  3:23 PM) Never takes more than 1 per day; takes ~3 times per week  Insulin Glargine (BASAGLAR KWIKPEN) 100 UNIT/ML [id number] Yes Inject 10 Units into the skin daily. [name], MD Taking Active   Lancets MISC [id number] Yes Use up to 4 times daily to check  blood sugars. Coral Spikes, DO Taking Active   losartan (COZAAR) 50 MG tablet [id number] Yes TAKE 1 TABLET DAILY [name], MD Taking Active   Melatonin 5 MG CHEW [id number] Yes Chew 5 mg by mouth daily. [provider] Taking Active   potassium chloride (K-DUR) 10 MEQ tablet [id number] Yes potassium chloride ER 10 mEq tablet,extended release [provider] Taking Active            Med Note Thurmond Butts, CAROLINE E   Mon Nov 06, 2017 10:32 AM) Only takes when she takes furosemide  Semaglutide,0.25 or 0.5MG /DOS, (OZEMPIC, 0.25 OR 0.5 MG/DOSE,)  2 MG/1.5ML SOPN [id number] Yes Inject 0.5 mg into the skin once a week. [name], MD Taking Active           Patient Active Problem List   Diagnosis Date Noted  . Dysuria 03/02/2020  . Shingles 12/05/2019  . Edema of lower extremity 11/19/2019  . Hyperlipidemia 11/15/2019  . Low back pain 11/15/2019  . Dry eyes 10/01/2019  . Tick bite 10/01/2019  . Vomiting 10/01/2019  . Personal history of other specified conditions 07/03/2019  . Fall 04/15/2019  . Type 2 diabetes mellitus ([place]) 03/18/2019  . Hypertension 03/18/2019  . Hyperparathyroidism due to renal insufficiency ([place]) 03/18/2019  . Headache disorder 09/10/2018  . Cervico-occipital neuralgia 07/23/2018  . Osteoporosis 07/23/2018  . Cramp and spasm 02/12/2018  . Paresthesia of hand 10/25/2017  . Lesion of rectum 09/07/2017  . Loss of height 08/15/2017  . 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 Cervical spondylosis without myelopathy 08/15/2017  . DDD (degenerative disc disease), cervical 08/15/2017  . Bursitis of hip 08/15/2017  . Female stress incontinence 08/15/2017  . Muscle weakness 08/15/2017  . Neck pain 08/15/2017  . Spondylolisthesis, congenital 08/15/2017  . Radial styloid tenosynovitis 08/15/2017  . Senile osteoporosis 08/15/2017  . Hip pain 08/15/2017  . 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  . Rectal hemorrhage 07/25/2017  .  Essential tremor 04/26/2017  . Other fatigue 03/22/2017  . Left ventricular hypertrophy 10/12/2016  . Chronic kidney disease, stage 4 (severe) ([place]) 08/26/2016  . Obstructive sleep apnea syndrome 08/26/2016    Medication Assistance: None required. Patient affirms current coverage meets needs.   Patient Care Plan: Disease Management    Problem Identified: Medication Management - Diabetes, Mental Health     Long-Range Goal: Patient Stated   This Visit's Progress: On track  Priority: High  Note:   Current Barriers:  . Unable to achieve control of diabetes  . Unable to independently control mental health diagnoses  Pharmacist Clinical Goal(s):  Marland Kitchen Over the next 90 days, patient will achieve adherence to monitoring guidelines and medication adherence to achieve therapeutic efficacy. Marland Kitchen achieve control of diabetes as evidenced by A1c through collaboration with PharmD and provider.   Interventions: . Inter-disciplinary care team collaboration (see longitudinal plan of care) . Comprehensive medication review performed; medication list updated in electronic medical record  Diabetes: . Uncontrolled, but improved per CGM readings; current treatment: Basaglar 10 units daily, Ozempic 0.5 mg weeky; o Renal function limits metformin, SGLT2  . Current glucose readings:  Time in Goal:  - Time in range 70-180: 83% - Time above range: 17% - Time below range: 0% . Denies hypoglycemic symptoms . Current meal patterns: reports that she has not had a soda in "weeks" . Current exercise: notes that she is not very active right now, but plans to focus on increasing moving forward.  . Peripheral neuropathy: gabapentin 100 mg QPM- would like Dr. Caryl Bis to take over prescribing for this . Recommended continue current regimen . PCP visit in ~4 days  Hypertension: . Controlled per last clinic BP; current treatment: amlodipine 5 mg daily, losartan 50 mg daily . Furosemide 40 mg + potassium 10 mEq PRN  lower extremity edema . Recommended continue current regimen  Hyperlipidemia, ASCVD risk reduction . Controlled; current treatment: atorvastatin 80 mg daily, ezetimibe 10 mg daily;  . Antiplatelet regimen: aspirin 81 mg daily . Recommended continue current regimen  Depression, anxiety: . Improved with dose reduction of escitalopram 5 mg. Notes that she is still tired,  but anxiety has improved. Specifically reports continued lingering worries that she "won't live through the year" specifically in regards to renal function. Denies SI/HI today.  Osteoporosis: . Appropriately treated w/ Prolia Q6 months; calcium 1200 mg + Vitamin D 1000 units daily; calcitriol 0.25 mcg daily per nephrology   Chronic Pain - Degenerative Disc Disease, Arthritis: . PRN hydrocodone/APAP 5/325 mg PRN, cyclobenzaprine 10 mg daily, docusate 100 mg PRN  Patient Goals/Self-Care Activities . Over the next 90 days, patient will:  - take medications as prescribed - check glucose at least QID using continuous glucose monitor, document, and provide at future appointments - target a minimum of 150 minutes of moderate intensity exercise weekly  Follow Up Plan: Face to Face appointment with care management team member scheduled for:       Plan: Face to Face appointment with care management team member scheduled for: 05/20/19  [name], PharmD, BCACP, [place] Pharmacist [place] [phone]

## 2020-03-20 NOTE — Patient Instructions (Signed)
Visit Information Patient Care Plan: Disease Management    Problem Identified: Medication Management - Diabetes, Mental Health     Long-Range Goal: Patient Stated   This Visit's Progress: On track  Priority: High  Note:   Current Barriers:  . Unable to achieve control of diabetes  . Unable to independently control mental health diagnoses  Pharmacist Clinical Goal(s):  Marland Kitchen Over the next 90 days, patient will achieve adherence to monitoring guidelines and medication adherence to achieve therapeutic efficacy. Marland Kitchen achieve control of diabetes as evidenced by A1c through collaboration with PharmD and provider.   Interventions: . Inter-disciplinary care team collaboration (see longitudinal plan of care) . Comprehensive medication review performed; medication list updated in electronic medical record  Diabetes: . Uncontrolled, but improved per CGM readings; current treatment: Basaglar 10 units daily, Ozempic 0.5 mg weeky; o Renal function limits metformin, SGLT2  . Current glucose readings:  Time in Goal:  - Time in range 70-180: 83% - Time above range: 17% - Time below range: 0% . Denies hypoglycemic symptoms . Current meal patterns: reports that she has not had a soda in "weeks" . Current exercise: notes that she is not very active right now, but plans to focus on increasing moving forward.  . Peripheral neuropathy: gabapentin 100 mg QPM- would like Dr. Caryl Bis to take over prescribing for this . Recommended continue current regimen . PCP visit in ~4 days  Hypertension: . Controlled per last clinic BP; current treatment: amlodipine 5 mg daily, losartan 50 mg daily . Furosemide 40 mg + potassium 10 mEq PRN lower extremity edema . Recommended continue current regimen  Hyperlipidemia, ASCVD risk reduction . Controlled; current treatment: atorvastatin 80 mg daily, ezetimibe 10 mg daily;  . Antiplatelet regimen: aspirin 81 mg daily . Recommended continue current regimen  Depression,  anxiety: . Improved with dose reduction of escitalopram 5 mg. Notes that she is still tired, but anxiety has improved. Specifically reports continued lingering worries that she "won't live through the year" specifically in regards to renal function. Denies SI/HI today.  Osteoporosis: . Appropriately treated w/ Prolia Q6 months; calcium 1200 mg + Vitamin D 1000 units daily; calcitriol 0.25 mcg daily per nephrology   Chronic Pain - Degenerative Disc Disease, Arthritis: . PRN hydrocodone/APAP 5/325 mg PRN, cyclobenzaprine 10 mg daily, docusate 100 mg PRN  Patient Goals/Self-Care Activities . Over the next 90 days, patient will:  - take medications as prescribed - check glucose at least QID using continuous glucose monitor, document, and provide at future appointments - target a minimum of 150 minutes of moderate intensity exercise weekly  Follow Up Plan: Face to Face appointment with care management team member scheduled for:        The patient verbalized understanding of instructions, educational materials, and care plan provided today and declined offer to receive copy of patient instructions, educational materials, and care plan.    Plan: Face to Face appointment with care management team member scheduled for: 05/20/19  [name], PharmD, [place], [place] [phone]

## 2020-03-21 DIAGNOSIS — Z23 Encounter for immunization: Secondary | ICD-10-CM | POA: Diagnosis not present

## 2020-03-23 ENCOUNTER — Ambulatory Visit (INDEPENDENT_AMBULATORY_CARE_PROVIDER_SITE_OTHER): Payer: Medicare Other | Admitting: Family Medicine

## 2020-03-23 ENCOUNTER — Encounter: Payer: Self-pay | Admitting: Family Medicine

## 2020-03-23 ENCOUNTER — Other Ambulatory Visit: Payer: Self-pay

## 2020-03-23 VITALS — BP 140/70 | HR 82 | Temp 98.0°F | Ht 63.0 in | Wt 132.0 lb

## 2020-03-23 DIAGNOSIS — F418 Other specified anxiety disorders: Secondary | ICD-10-CM

## 2020-03-23 DIAGNOSIS — E1122 Type 2 diabetes mellitus with diabetic chronic kidney disease: Secondary | ICD-10-CM

## 2020-03-23 DIAGNOSIS — N184 Chronic kidney disease, stage 4 (severe): Secondary | ICD-10-CM | POA: Diagnosis not present

## 2020-03-23 DIAGNOSIS — Z794 Long term (current) use of insulin: Secondary | ICD-10-CM | POA: Diagnosis not present

## 2020-03-23 NOTE — Patient Instructions (Signed)
Nice to see you. Please stop your Lexapro.  Please monitor your symptoms for a week.  If your anxiety worsens please let us know. We will contact you with your lab results.

## 2020-03-24 LAB — BASIC METABOLIC PANEL
BUN: 24 mg/dL — ABNORMAL HIGH (ref 6–23)
CO2: 33 mEq/L — ABNORMAL HIGH (ref 19–32)
Calcium: 10.6 mg/dL — ABNORMAL HIGH (ref 8.4–10.5)
Chloride: 104 mEq/L (ref 96–112)
Creatinine, Ser: 2.34 mg/dL — ABNORMAL HIGH (ref 0.40–1.20)
GFR: 19.41 mL/min — ABNORMAL LOW (ref >60.00)
Glucose, Bld: 205 mg/dL — ABNORMAL HIGH (ref 70–99)
Potassium: 4.9 mEq/L (ref 3.5–5.1)
Sodium: 144 mEq/L (ref 135–145)

## 2020-03-24 LAB — HEMOGLOBIN A1C: Hgb A1c MFr Bld: 7.5 % — ABNORMAL HIGH (ref 4.6–6.5)

## 2020-03-25 ENCOUNTER — Telehealth: Payer: Self-pay | Admitting: Family Medicine

## 2020-03-25 ENCOUNTER — Encounter: Payer: Self-pay | Admitting: Family Medicine

## 2020-03-25 DIAGNOSIS — F32A Depression, unspecified: Secondary | ICD-10-CM

## 2020-03-25 DIAGNOSIS — F418 Other specified anxiety disorders: Secondary | ICD-10-CM

## 2020-03-25 DIAGNOSIS — F419 Anxiety disorder, unspecified: Secondary | ICD-10-CM

## 2020-03-25 NOTE — Progress Notes (Signed)
Kelly Rumps, MD Phone: [phone]  [name] is a 79 y.o. female who presents today for f/u.  Depression/anxiety: Patient feels as though her anxiety may be worse on the Lexapro.  It was definitely worse on the 10 mg dose.  She notes some mild depression.  No SI or HI.  Notes her thoughts are going everywhere and she has trouble sleeping.  She was on Paxil for years and that did work quite well for her.  Diabetes: Taking Basaglar 10 units daily and Ozempic 0.5 mg weekly.  No polyuria or polydipsia.  One episode of hypoglycemia to 21 though she noted no symptoms.    Vomiting: She does note occasional episodes of vomiting.  These occur sporadically.  These have been occurring sporadically for years.  No abdominal pain, diarrhea, or blood with this.  Typically has one episode and then does not have any 4 weeks.  Social History   Tobacco Use  Smoking Status Never Smoker  Smokeless Tobacco Never Used     ROS see history of present illness  Objective  Physical Exam Vitals:   03/23/20 1604  BP: 140/70  Pulse: 82  Temp: 98 F (36.7 C)  SpO2: 96%    BP Readings from Last 3 Encounters:  03/23/20 140/70  03/02/20 118/70  01/01/20 116/78   Wt Readings from Last 3 Encounters:  03/23/20 132 lb (59.9 kg)  03/02/20 134 lb 12.8 oz (61.1 kg)  01/01/20 139 lb 6.4 oz (63.2 kg)    Physical Exam Constitutional:      General: She is not in acute distress.    Appearance: She is not diaphoretic.  Cardiovascular:     Rate and Rhythm: Normal rate and regular rhythm.     Heart sounds: Normal heart sounds.  Pulmonary:     Effort: Pulmonary effort is normal.     Breath sounds: Normal breath sounds.  Abdominal:     General: Bowel sounds are normal. There is no distension.     Palpations: Abdomen is soft.     Tenderness: There is no abdominal tenderness. There is no guarding or rebound.  Skin:    General: Skin is warm and dry.  Neurological:     Mental Status: She is  alert.      Assessment/Plan: Please see individual problem list.  Problem List Items Addressed This Visit    Mixed anxiety and depressive disorder    Continues to have some symptoms despite taking Lexapro 5 mg a day.  She would like to discontinue this and see how she does off of the Lexapro.  She will contact us in a week.  I will also have my CMA reach out to her in a week as well to check in and see how she is doing.  If anxiety and depression is not improving off of the Lexapro I would consider starting her on Zoloft.      Type 2 diabetes mellitus (HCC) - Primary    Check A1c and BMP.  She will continue Basaglar 10 units daily and Ozempic 0.5 mg weekly.      Relevant Orders   HgB A1c (Completed)   Basic Metabolic Panel (BMET) (Completed)      This visit occurred during the SARS-CoV-2 public health emergency.  Safety protocols were in place, including screening questions prior to the visit, additional usage of staff PPE, and extensive cleaning of exam room while observing appropriate contact time as indicated for disinfecting solutions.    Kelly Rumps, MD  [place]

## 2020-03-25 NOTE — Assessment & Plan Note (Signed)
Continues to have some symptoms despite taking Lexapro 5 mg a day.  She would like to discontinue this and see how she does off of the Lexapro.  She will contact us in a week.  I will also have my CMA reach out to her in a week as well to check in and see how she is doing.  If anxiety and depression is not improving off of the Lexapro I would consider starting her on Zoloft.

## 2020-03-25 NOTE — Telephone Encounter (Signed)
Please call the patient on 03/30/20 to see how her anxiety and depression are doing with her being off of the lexapro. Thanks.

## 2020-03-25 NOTE — Assessment & Plan Note (Signed)
Check A1c and BMP.  She will continue Basaglar 10 units daily and Ozempic 0.5 mg weekly.

## 2020-03-30 ENCOUNTER — Other Ambulatory Visit: Payer: Self-pay | Admitting: *Deleted

## 2020-03-30 ENCOUNTER — Other Ambulatory Visit: Payer: Self-pay

## 2020-03-30 NOTE — Patient Outreach (Signed)
[facility]) Care Management  03/30/2020  Kelly [name] [birth year] [id number]   [facility] Telephone Assessment/Screen for MD referral  Referral Date:02/19/20 Referral Source:MD referral, [name] [facility] embedded pharmacy staff Referral Reason:Disease management education and mental health support Catie-I've worked with this patient for ~2 years now on DM, making significant improvements. 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 Tearful during visit today about what she can do to prevent decline. Not much support/anybody to talk to at home besides her daughter, who is causing more worry. Would appreciate diet/disease education, support, and definitely mental health support.  Insurance:blue cross and blue shield    Outreach successful Patient is able to verify HIPAA ([place] and Accountability Act) identifiers Reviewed and addressed the purpose of the follow up call with the patient  Consent: [facility]) RN CM reviewed [facility] services with patient. Patient gave verbal consent for services.    Follow up assessment Depression Kelly [name] reports she is depressed She reports seeing her primary care provider (PCP) last week She reports a recent history of being taken off Paxil and started on Lexapro She reports not benefiting from Lexapro.Depression screen completed. PHQ2 =2, PHQ9= 6 She reports having poor appetite, loss of interest in doing things, sleeping more up to "sixteen hours and not getting out of bed"  She reports her poor appetite may be also related to ozempic She reports feeling "doom and gloom. Like I am not going to be here much longer. I am afraid my kidneys will fail" She denies wanting to hurt herself or others She and [facility] RN Cm discussed her GRF and kidney functions in detail during the last outreach and on today She also confirms she has spoken in details with  her nephrologist, Dr Candiss Norse.  She voices understanding that kidney illnesses may occur because of various reasons when a patient is hospitalized as a secondary concern. [facility] RN CM discussed the importance of maintaining all major medical issues. She voiced understanding A new symptom she discusses is having nausea with a yellow emesis recently She reports eating popcorn prior to bedtime but nothing else different She denies noting undigested popcorn [facility] RN CM discussed actions that may help to decrease the episode from reoccurring to include limiting intake of alcohol (she does not use alcohol), not lifting heavy object to avoid hernia issues (she does have a hernia per pt) avoid smoking (she does not smoke), and eating a variety of fruits, vegetables and fiber.   [facility] RN CM inquired about [facility] SW referral for mental health resources/services, psychiatry (managed BH medicines) or counselor (manage emotions, CBT) services. She confirms she has not had a psychiatrist nor a Social worker. [facility] RN CM inquired if [facility] RN CM could have her permission to speak with her pcp/pcp RN about possibly seeing if psychiatry or counselor services may benefit her as she reports she has been on Paxil for years and she has become more depressed with thoughts of her renal illness She voices not wanting to do anything her pcp does not think is beneficial as she values his input   [facility] care coordination Outreach to Dr Caryl Bis office [phone] Spoke with Gae Bon to share Kelly [name]'s symptoms and concerns with continuation of depression since change in her treatment plan. [facility] RN CM inquired about possible referrals to counselor or psychiatry  Gae Bon will speak with Dr Biagio Quint  Diabetes Kelly [name] continues to report her cbg values are staying close to 120s  Chronic Kidney disease (CKD) Reviewed CKD symptoms, action plan, glomerular filtration rate (GFR)., fluid intake self care, activity, diet, medicines, She denies trouble  with fluid retention She states she has minimal edema She voiced concern of not knowing or recognizing any abnormal  signs and symptoms (s/s) except her MD outreached to her about her abnormal kidney functioning values     [facility]ans [facility] RN CM will follow up with Kelly [name] in the next 14-21 business days Pt encouraged to return a call to [facility] RN CM prn Routed note to MD Goals      Patient Stated   .  [facility]) Become More Active (pt-stated)      Follow Up Date 04/14/20   - choose a type of activity I enjoy such as biking, gardening, team sports, walking - keep track of how long I exercise     Notes:     .  ([facility]) Eat Healthy (pt-stated)      Follow Up Date 04/14/20    - drink 6 to 8 glasses of water each day - set a realistic goal     Notes:     .  [facility]) Follow My Treatment Plan (pt-stated)      Follow Up Date 04/14/20   - ask for help if I can't afford my medicines - avoid negative self-talk - call the doctor or nurse to get help with side effects - keep follow-up appointments        Notes:     .  [facility]) Monitor and Manage My Blood Sugar (pt-stated)      Follow Up Date 04/14/20   - check blood sugar at prescribed times - check blood sugar if I feel it is too high or too low       Notes:     .  [facility]) Track and Manage My Symptoms (pt-stated)      Follow Up Date 04/14/20  - develop a personal safety plan - watch for early signs of feeling worse     Notes: 05/31/19 outreach to pcp RN to share Kelly [name]'s symptoms and concerns with continuation of depression since change in her treatment plan. [facility] RN CM inquired about possible referrals to counselor or psychiatry  Gae Bon will speak with Dr Doree Albee L. Lavina Hamman, RN, BSN, [place] Coordinator Office number [phone] [facility] number [phone] Fax number [phone]

## 2020-03-30 NOTE — Telephone Encounter (Signed)
I received a call from  a [facility] nurse name Maudie Mercury about the patient , she is off Lexapro and she is still very depressed.  The nurse asked the patient if she has ever seen psychology and she stated she had not , so she wanted to ask if you would put in a referral for the patient to get her on the right medication.  Kelly [name],cma

## 2020-03-31 NOTE — Addendum Note (Signed)
Addended by: [name] on: 03/31/2020 11:48 AM   Modules accepted: Orders

## 2020-03-31 NOTE — Telephone Encounter (Signed)
I called and spoke with the patient and she stated if you feel she needs to see a psychiatrist she would, but in the meantime she would like to try a new medication and she wants it sent to [facility].  Also the patient stated she needs a new RX for Gabapentin she is out, another Dr. Prescribed it and she no longer see him.  She wants that Rx sent to [facility] mail order.  Shjon Lizarraga,cma

## 2020-03-31 NOTE — Telephone Encounter (Signed)
I have placed a referral.  Does the patient want to start on another medication in the meantime?  Thanks.

## 2020-04-01 ENCOUNTER — Other Ambulatory Visit: Payer: Self-pay | Admitting: Family Medicine

## 2020-04-01 MED ORDER — GABAPENTIN 100 MG PO CAPS: 100.0000 mg | ORAL_CAPSULE | Freq: Two times a day (BID) | ORAL | 1 refills | Status: DC

## 2020-04-06 MED ORDER — SERTRALINE HCL 50 MG PO TABS: 50.0000 mg | ORAL_TABLET | Freq: Every day | ORAL | 3 refills | Status: DC

## 2020-04-06 NOTE — Telephone Encounter (Signed)
Called and spoke to [employer]. Hema verbalized understanding and had no further questions.

## 2020-04-06 NOTE — Telephone Encounter (Signed)
Pt wanted a call back about what was discussed earlier and to get results

## 2020-04-06 NOTE — Telephone Encounter (Signed)
I sent zoloft in for her to start on. I will go ahead and make a psychiatry referral as well. I sent her gabapentin in last week.

## 2020-04-06 NOTE — Addendum Note (Signed)
Addended by: [name] on: 04/06/2020 09:54 AM   Modules accepted: Orders

## 2020-04-06 NOTE — Telephone Encounter (Signed)
Called and spoke to [employer]. She states that when I called at 10:11, I had woke her up from a nap and that she did not remember what I had told her. I repeated the lab results and Kelly [name] again verbalized understanding and states that she had no further questions.

## 2020-04-07 ENCOUNTER — Ambulatory Visit: Payer: Medicare Other | Admitting: Family Medicine

## 2020-04-07 ENCOUNTER — Telehealth: Payer: Self-pay | Admitting: Podiatry

## 2020-04-07 NOTE — Telephone Encounter (Signed)
Pt called and spoke to [facility] asking for a rx for diabetic shoes to take somewhere else to get since she has never received her shoes from our office in [place]. They were ordered in November 27 2019.   It appears that shoes may have arrived 8.2 but I do not see where inserts ever arrived.

## 2020-04-08 ENCOUNTER — Encounter: Payer: Self-pay | Admitting: Dietician

## 2020-04-08 NOTE — Telephone Encounter (Signed)
Dawn, check with Delsa Bern on this one..I would hate for her to not be able to get shoes for this year...her shoes are here

## 2020-04-09 ENCOUNTER — Telehealth: Payer: Self-pay | Admitting: Podiatry

## 2020-04-09 NOTE — Telephone Encounter (Signed)
Pt called and was not happy with the service for the diabetic shoes. She said when she originally came to see Dr Posey Pronto in [place] she had paperwork for [facility] to get shoes from there and he said we could get them at our office and she believes he discarded the paperwork. She stated when she came in last time they had the shoes but they were the wrong color (Blue not White) and no inserts.   I explained that the shoe style she ordered is no longer available in white but I could get her in to pick out a different shoe if she would like. She was also concerned that we had filed the claim with the insurance and I told her we had not that we are not allowed to bill insurance until she has the shoes and walks out with them. She said she called the other day to the [place] office and was told the person was out to lunch and would call her and no one has called. She is not happy with our diabetic shoe process and would just like a RX to go to [place] to get her shoes and inserts. I apologized and told pt I would send the message and someone would call when rx and office note are ready to be picked up.

## 2020-04-10 NOTE — Telephone Encounter (Signed)
Have her come pick it up at [place] office next tues

## 2020-04-14 ENCOUNTER — Other Ambulatory Visit: Payer: Self-pay

## 2020-04-14 ENCOUNTER — Other Ambulatory Visit: Payer: Self-pay | Admitting: *Deleted

## 2020-04-14 ENCOUNTER — Encounter: Payer: Self-pay | Admitting: *Deleted

## 2020-04-14 NOTE — Patient Outreach (Signed)
[facility] [facility]) Care Management  04/14/2020  Kelly [name] [birth year] [id number]   [facility] Telephone Assessment/Screen for MD referral  Referral Date:02/19/20 Referral Source:MD referral, Kelly [name] [facility] embedded pharmacy staff Referral Reason:Disease management education and mental health support Kelly-I've worked with this patient for ~2 years now on DM, making significant improvements. 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 Tearful during visit today about what she can do to prevent decline. Not much support/anybody to talk to at home besides her daughter, who is causing more worry. Would appreciate diet/disease education, support, and definitely mental health support.  Insurance:blue cross and blue shield    Outreach successful Patient is able to verify HIPAA ([place] and Accountability Act) identifiers Reviewed and addressed the purpose of the follow up call with the patient  Consent: [facility]) RN [name] reviewed [facility] services with patient. Patient gave verbal consent for services.    Follow up assessment Depression Kelly [name] reports her depression has improved since she has been on Zoloft 50 mg every day for the last 2 weeks. This was prescribed by Kelly [name] She reports her appetite, loss of interest in doing things, sleeping has improved She reports she is "no longer worried abut dying" She reports she has lost more weight related to [place] She is at 128 lbs and states she would like to lose about 3 more lbs She states she did receive a call from a female staff member of Kelly [name] office to offer referral for a psychiatrist but at this time she does not feel she is needing to speak with a psychiatrist [facility] RN [name] and Kelly [name] discussed the differences in a psychiatrist and a mental health therapist/counselor  She still feels at this time she is okay with  not speaking with a counselor and continuing to speak with Kelly [name]   She denies wanting to hurt herself or othersgoal met Depression screen [facility] 2/9 04/14/2020  Decreased Interest 1  Down, Depressed, Hopeless 0  PHQ - 2 Score 1  Altered sleeping 0  Tired, decreased energy 0  Change in appetite 0  Feeling bad or failure about yourself  0  Trouble concentrating 0  Moving slowly or fidgety/restless 0  Suicidal thoughts 0  PHQ-9 Score 1  Difficult doing work/chores Not difficult at all    Diabetes She reports home management continues to be good She continues to monitor her cbg using her freestyle libre  Her cbg has ranged from 104-121  On today her cbg was 147 after eating [place] meal Her lowest cbg was 66 on 04/12/20  She reports this value increased after she ate a meal  She reports her Libre alarms when her cbg value is too low or too high She completed errands today and went to her podiatrist to get forms to take to [facility] and ordered her diabetic shoes   Voice squeaky Pt reports change in the tone of her voice [facility] RN [name] answered questions about reasons for voice changes [facility] RN [name] lead patient through a exercise using her diaphragm to assist with improving her vocal changes Shared with her that speaking from her diaphragm makes her speech fuller and less squeaky Encouraged her to practice the method She voiced appreciation  Plans [facility] RN [name] will follow up with Kelly [name] within the next 30-45 business days for further assessment of care coordination and/or disease management needs Pt encouraged to return a call  to [facility] RN [name] prn Routed note to MD  Goals      Patient Stated   .  [facility] [name]) Become More Active (pt-stated)      Follow Up Date 05/15/20    - choose a type of activity I enjoy such as biking, gardening, team sports, walking - keep track of how long I exercise     Notes:     .  ([facility]) Eat Healthy (pt-stated)      Follow Up Date 05/15/20      - drink 6 to 8 glasses of water each day - set a realistic goal       Notes:     .  [facility] [name]) Follow My Treatment Plan (pt-stated)      Follow Up Date 05/15/20    - ask for help if I can't afford my medicines - avoid negative self-talk - call the doctor or nurse to get help with side effects - keep follow-up appointments      Notes:     .  [facility] [name]) Monitor and Manage My Blood Sugar (pt-stated)      Follow Up Date 05/15/20    - check blood sugar at prescribed times - check blood sugar if I feel it is too high or too low      Notes:     .  [facility]) Track and Manage My Symptoms (pt-stated)      Follow Up Date 05/15/20  - develop a personal safety plan - watch for early signs of feeling worse     Notes: 04/14/20 depression symptoms better since been on zoloft 50 mg for 2 weeks  03/30/20 outreach to pcp RN to share Kelly [name]'s symptoms and concerns with continuation of depression since change in her treatment plan. [facility] RN [name] inquired about possible referrals to counselor or psychiatry  Kelly [name] will speak with Kelly Doree Albee L. Lavina Hamman, RN, BSN, [place] Coordinator Office number [phone] [facility] [place] [name] number [phone] Fax number [phone]

## 2020-04-16 ENCOUNTER — Other Ambulatory Visit: Payer: Self-pay | Admitting: Family Medicine

## 2020-04-16 MED ORDER — OZEMPIC (1 MG/DOSE) 2 MG/1.5ML ~~LOC~~ SOPN: 1.0000 mg | PEN_INJECTOR | SUBCUTANEOUS | 2 refills | Status: DC

## 2020-04-17 ENCOUNTER — Other Ambulatory Visit: Payer: Self-pay | Admitting: Family Medicine

## 2020-04-17 ENCOUNTER — Ambulatory Visit: Payer: Medicare Other | Admitting: Pharmacist

## 2020-04-17 DIAGNOSIS — I10 Essential (primary) hypertension: Secondary | ICD-10-CM

## 2020-04-17 DIAGNOSIS — E1122 Type 2 diabetes mellitus with diabetic chronic kidney disease: Secondary | ICD-10-CM

## 2020-04-17 DIAGNOSIS — N184 Chronic kidney disease, stage 4 (severe): Secondary | ICD-10-CM

## 2020-04-17 DIAGNOSIS — E785 Hyperlipidemia, unspecified: Secondary | ICD-10-CM

## 2020-04-17 MED ORDER — OZEMPIC (0.25 OR 0.5 MG/DOSE) 2 MG/1.5ML ~~LOC~~ SOPN: 0.5000 mg | PEN_INJECTOR | SUBCUTANEOUS | 1 refills | Status: DC

## 2020-04-17 NOTE — Chronic Care Management (AMB) (Signed)
Chronic Care Management   Pharmacy Note  04/17/2020 Name: Kelly [name] MRN: [id number] DOB: [birth year]   Subjective:  Kelly [name] is a 79 y.o. year old female who is a primary care patient of Caryl Bis, Angela Adam, MD. The CCM team was consulted for assistance with chronic disease management and care coordination needs.    Engaged with patient by telephone for follow up visit in response to provider referral for pharmacy case management and/or care coordination services.   Consent to Services:  Kelly [name] was given information about Chronic Care Management services, agreed to services, and gave verbal consent prior to initiation of services on 11/15/2018. Please see initial visit note for detailed documentation.   SDOH (Social Determinants of Health) assessments and interventions performed: none    Objective:  Lab Results  Component Value Date   CREATININE 2.34 (H) 03/23/2020   CREATININE 2.34 (H) 03/02/2020   CREATININE 2.68 (H) 10/20/2019    Lab Results  Component Value Date   HGBA1C 7.5 (H) 03/23/2020       Component Value Date/Time   CHOL 107 04/22/2019 1055   TRIG 168.0 (H) 04/22/2019 1055   HDL 27.40 (L) 04/22/2019 1055   CHOLHDL 4 04/22/2019 1055   VLDL 33.6 04/22/2019 1055   LDLCALC 46 04/22/2019 1055   LDLDIRECT 78.0 02/18/2019 0857    BP Readings from Last 3 Encounters:  03/23/20 140/70  03/02/20 118/70  01/01/20 116/78    Assessment/Interventions: Review of patient past medical history, allergies, medications, health status, including review of consultants reports, laboratory and other test data, was performed as part of comprehensive evaluation and provision of chronic care management services.   Allergies  Allergen Reactions   Prilosec Otc [Omeprazole Magnesium] Other (See Comments)    Maybe cause of acute interstitial nephritis   Sucralfate Other (See Comments)    Maybe cause of acute interstitial nephritis   Amoxicillin  Diarrhea   Morphine And Related Other (See Comments)    Chest pains Chest pains    Medications Reviewed Today    Reviewed by Barbaraann Faster, RN (Registered Nurse) on 03/30/20 at 1634  Med List Status: <None>  Medication Order Taking? Sig Documenting Provider Last Dose Status Informant  amLODipine (NORVASC) 5 MG tablet [id number] No Take 1 tablet (5 mg total) by mouth daily. [name], MD Taking Active   aspirin EC 81 MG tablet [id number] No Take 81 mg by mouth daily. [provider] Taking Active Multiple Informants  atorvastatin (LIPITOR) 80 MG tablet [id number] No TAKE 1 TABLET DAILY [name], MD Taking Active   calcitRIOL (ROCALTROL) 0.25 MCG capsule [id number] No Take 0.25 mcg by mouth daily. [provider] Taking Active   Calcium Citrate-Vitamin D (CALCIUM CITRATE +D PO) [id number] No Take 600 mg by mouth daily.  [provider] Taking Active   Cholecalciferol (VITAMIN D) 50 MCG (2000 UT) CAPS [id number] No Take 2,000 Units by mouth daily.  [provider] Taking Active   Continuous Blood Gluc Sensor (FREESTYLE LIBRE 2 SENSOR) MISC [id number] No Use to check sugar at least 4 times daily [name], MD Taking Active   cyclobenzaprine (FLEXERIL) 10 MG tablet [id number] No TAKE 1/2 TABLET (5MG  TOTAL)3 TIMES A DAY AS NEEDED    FOR MUSCLE SPASMS [name], MD Taking Active   denosumab (PROLIA) 60 MG/ML SOSY injection [id number] No Inject 60 mg into the skin every 6 (six) months. [provider] Taking Active  Med Note Erling Cruz, ANNA Angelica Ran Sep 24, 2018  1:07 PM) Last dose in April   docusate sodium (COLACE) 100 MG capsule [id number] No Take 100 mg by mouth daily.  [provider] Taking Active   escitalopram (LEXAPRO) 10 MG tablet [id number] No Take 0.5 tablets (5 mg total) by mouth daily for 14 days, THEN 1 tablet (10 mg total) daily. [name], MD Taking Expired 03/20/20 2359            Med  Note ([place] Simerson E   Fri Mar 20, 2020 11:49 AM) Taking 5 mg daily  furosemide (LASIX) 40 MG tablet [id number] No Take 1 tablet (40 mg total) by mouth daily as needed. [name], MD Taking Active   gabapentin (NEURONTIN) 100 MG capsule [id number] No Take 100 mg by mouth 2 (two) times daily. [provider] Taking Active            Med Note [name], Arville Lime   Fri Mar 20, 2020 11:50 AM)    glucosamine-chondroitin 500-400 MG tablet [id number] No Take 1 tablet by mouth 3 (three) times daily. [provider] Taking Active   glucose blood (ACCU-CHEK AVIVA PLUS) test strip [id number] No Use to check blood sugar up to 4 times daily [name], MD Taking Active   HYDROcodone-acetaminophen [facility]) 5-325 MG tablet [id number] No Norco 5 mg-325 mg tablet  Take 1 tablet twice a day by oral route as needed. [provider] Taking Active            Med Note [name] Dec 18, 2019  3:23 PM) Never takes more than 1 per day; takes ~3 times per week  Insulin Glargine (BASAGLAR KWIKPEN) 100 UNIT/ML [id number] No Inject 10 Units into the skin daily. [name], MD Taking Active   Lancets MISC [id number] No Use up to 4 times daily to check blood sugars. Coral Spikes, DO Taking Active   losartan (COZAAR) 50 MG tablet [id number] No TAKE 1 TABLET DAILY [name], MD Taking Active   Melatonin 5 MG CHEW [id number] No Chew 5 mg by mouth daily. [provider] Taking Active   potassium chloride (K-DUR) 10 MEQ tablet [id number] No potassium chloride ER 10 mEq tablet,extended release [provider] Taking Active            Med Note Thurmond Butts, CAROLINE E   Mon Nov 06, 2017 10:32 AM) Only takes when she takes furosemide  Semaglutide,0.25 or 0.5MG /DOS, (OZEMPIC, 0.25 OR 0.5 MG/DOSE,) 2 MG/1.5ML SOPN [id number] No Inject 0.5 mg into the skin once a week. 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   Headache disorder 09/10/2018   Cervico-occipital neuralgia 07/23/2018   Osteoporosis 07/23/2018   Cramp and spasm 02/12/2018   Paresthesia of hand 10/25/2017   Lesion of rectum 09/07/2017   Loss of height 08/15/2017   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   Cervical spondylosis without myelopathy 08/15/2017  DDD (degenerative disc disease), cervical 08/15/2017   Bursitis of hip 08/15/2017   Female stress incontinence 08/15/2017   Muscle weakness 08/15/2017   Neck pain 08/15/2017   Spondylolisthesis, congenital 08/15/2017   Radial styloid tenosynovitis 08/15/2017   Senile osteoporosis 08/15/2017   Hip pain 08/15/2017   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   Rectal hemorrhage 07/25/2017   Essential tremor 04/26/2017   Other fatigue 03/22/2017   Left ventricular hypertrophy 10/12/2016   Chronic kidney disease, stage 4 (severe) ([place]) 08/26/2016   Obstructive sleep apnea syndrome 08/26/2016    Medication Assistance: None required. Patient affirms current coverage meets needs.   Patient Care Plan: Medication Management    Problem Identified: Diabetes, CKD, Mental Health     Long-Range Goal: Disease Progression Prevention   Start Date: 03/02/2020  Expected End Date: 05/15/2020  This Visit's Progress: On track  Recent Progress: On track  Priority: High  Note:   Current Barriers:   Unable to achieve control of diabetes   Unable to independently control mental health diagnoses  Pharmacist Clinical Goal(s):    Over the next 90 days, patient will achieve adherence to monitoring guidelines and medication adherence to achieve therapeutic efficacy.  achieve control of diabetes as evidenced by A1c through collaboration with PharmD and provider.   Interventions:  Inter-disciplinary care team collaboration (see longitudinal plan of care)  Comprehensive medication review performed; medication list updated in electronic medical record  Diabetes:  Uncontrolled; current treatment: Ozempic 0.5 mg weekly (did not increase to 1 mg yet), Basaglar 10 units daily    Endorses significant nausea and vomiting for the past month. Notes she should have told the [facility] RM CM, but she forgot. Notes 9 days where she threw up, 6 days where she was so nauseous she didn't eat much. Wonders if this is related to Ozempic  Reduce Ozempic to 4.65 mg + 9 clicks (halfway in between 0.25 and 0.5 mg doses). Continue current Basaglar dose. Encouraged hydration, monitoring for low blood sugars and appropriate treatment.   Call scheduled in 2 weeks to review.   Hypertension:  Controlled per last clinic BP; current treatment: amlodipine 5 mg daily, losartan 50 mg daily  Furosemide 40 mg + potassium 10 mEq PRN lower extremity edema  Recommended continue current regimen  Hyperlipidemia, ASCVD risk reduction  Controlled; current treatment: atorvastatin 80 mg daily, ezetimibe 10 mg daily;   Antiplatelet regimen: aspirin 81 mg daily  Recommended continue current regimen  Depression, anxiety:  Improved with dose reduction of escitalopram 5 mg. Notes that she is still tired, but anxiety has improved.   Continue current regimen at this time  Osteoporosis:  Appropriately treated w/ Prolia Q6 months; calcium 1200 mg + Vitamin D 1000 units daily; calcitriol 0.25 mcg daily per nephrology   Chronic Pain - Degenerative Disc Disease, Arthritis:  PRN hydrocodone/APAP 5/325 mg PRN, cyclobenzaprine 10 mg daily, docusate 100 mg  PRN  Patient Goals/Self-Care Activities  Over the next 90 days, patient will:  - take medications as prescribed - check glucose at least QID using continuous glucose monitor, document, and provide at future appointments - target a minimum of 150 minutes of moderate intensity exercise weekly  Follow Up Plan: Telephone follow up appointment with care management team member scheduled for: ~ 2 weeks         Plan: Telephone follow up appointment with care management team member scheduled for:~ 2 weeks  [name], PharmD, [place], CPP Clinical Pharmacist [place]  [phone] ° ° °

## 2020-04-17 NOTE — Addendum Note (Signed)
Addended by: [name] on: 04/17/2020 03:12 PM   Modules accepted: Orders

## 2020-04-17 NOTE — Patient Instructions (Signed)
Visit Information  Patient Care Plan: Medication Management    Problem Identified: Diabetes, CKD, Mental Health     Long-Range Goal: Disease Progression Prevention   Start Date: 03/02/2020  Expected End Date: 05/15/2020  This Visit's Progress: On track  Recent Progress: On track  Priority: High  Note:   Current Barriers:  . Unable to achieve control of diabetes  . Unable to independently control mental health diagnoses  Pharmacist Clinical Goal(s):  Marland Kitchen Over the next 90 days, patient will achieve adherence to monitoring guidelines and medication adherence to achieve therapeutic efficacy. Marland Kitchen achieve control of diabetes as evidenced by A1c through collaboration with PharmD and provider.   Interventions: . Inter-disciplinary care team collaboration (see longitudinal plan of care) . Comprehensive medication review performed; medication list updated in electronic medical record  Diabetes: . Uncontrolled; current treatment: Ozempic 0.5 mg weekly (did not increase to 1 mg yet), Basaglar 10 units daily   . Endorses significant nausea and vomiting for the past month. Notes she should have told the [facility] RM CM, but she forgot. Notes 9 days where she threw up, 6 days where she was so nauseous she didn't eat much. Wonders if this is related to [place] . Reduce Ozempic to 0.27 mg + 9 clicks (halfway in between 0.25 and 0.5 mg doses). Continue current Basaglar dose. Encouraged hydration, monitoring for low blood sugars and appropriate treatment.  . Call scheduled in 2 weeks to review.   Hypertension: . Controlled per last clinic BP; current treatment: amlodipine 5 mg daily, losartan 50 mg daily . Furosemide 40 mg + potassium 10 mEq PRN lower extremity edema . Recommended continue current regimen  Hyperlipidemia, ASCVD risk reduction . Controlled; current treatment: atorvastatin 80 mg daily, ezetimibe 10 mg daily;  . Antiplatelet regimen: aspirin 81 mg daily . Recommended continue current  regimen  Depression, anxiety: . Improved with dose reduction of escitalopram 5 mg. Notes that she is still tired, but anxiety has improved.  . Continue current regimen at this time  Osteoporosis: . Appropriately treated w/ Prolia Q6 months; calcium 1200 mg + Vitamin D 1000 units daily; calcitriol 0.25 mcg daily per nephrology   Chronic Pain - Degenerative Disc Disease, Arthritis: . PRN hydrocodone/APAP 5/325 mg PRN, cyclobenzaprine 10 mg daily, docusate 100 mg PRN  Patient Goals/Self-Care Activities . Over the next 90 days, patient will:  - take medications as prescribed - check glucose at least QID using continuous glucose monitor, document, and provide at future appointments - target a minimum of 150 minutes of moderate intensity exercise weekly  Follow Up Plan: Telephone follow up appointment with care management team member scheduled for: ~ 2 weeks         The patient verbalized understanding of instructions, educational materials, and care plan provided today and declined offer to receive copy of patient instructions, educational materials, and care plan.    Plan: Telephone follow up appointment with care management team member scheduled for:~ 2 weeks  [name], PharmD, [place], [place] Pharmacist [place] [phone]

## 2020-04-22 ENCOUNTER — Ambulatory Visit: Payer: Medicare Other | Admitting: Pharmacist

## 2020-04-22 DIAGNOSIS — M545 Low back pain, unspecified: Secondary | ICD-10-CM | POA: Diagnosis not present

## 2020-04-22 DIAGNOSIS — M6281 Muscle weakness (generalized): Secondary | ICD-10-CM | POA: Diagnosis not present

## 2020-04-22 DIAGNOSIS — M25572 Pain in left ankle and joints of left foot: Secondary | ICD-10-CM | POA: Diagnosis not present

## 2020-04-22 DIAGNOSIS — E785 Hyperlipidemia, unspecified: Secondary | ICD-10-CM

## 2020-04-22 DIAGNOSIS — E1122 Type 2 diabetes mellitus with diabetic chronic kidney disease: Secondary | ICD-10-CM

## 2020-04-22 DIAGNOSIS — M25559 Pain in unspecified hip: Secondary | ICD-10-CM | POA: Diagnosis not present

## 2020-04-22 DIAGNOSIS — E1165 Type 2 diabetes mellitus with hyperglycemia: Secondary | ICD-10-CM | POA: Diagnosis not present

## 2020-04-22 DIAGNOSIS — M25519 Pain in unspecified shoulder: Secondary | ICD-10-CM | POA: Diagnosis not present

## 2020-04-22 DIAGNOSIS — Q762 Congenital spondylolisthesis: Secondary | ICD-10-CM | POA: Diagnosis not present

## 2020-04-22 DIAGNOSIS — M542 Cervicalgia: Secondary | ICD-10-CM | POA: Diagnosis not present

## 2020-04-22 DIAGNOSIS — M5032 Other cervical disc degeneration, mid-cervical region, unspecified level: Secondary | ICD-10-CM | POA: Diagnosis not present

## 2020-04-22 DIAGNOSIS — M7062 Trochanteric bursitis, left hip: Secondary | ICD-10-CM | POA: Diagnosis not present

## 2020-04-22 DIAGNOSIS — I10 Essential (primary) hypertension: Secondary | ICD-10-CM

## 2020-04-22 DIAGNOSIS — N184 Chronic kidney disease, stage 4 (severe): Secondary | ICD-10-CM

## 2020-04-22 DIAGNOSIS — T2101XA Burn of unspecified degree of chest wall, initial encounter: Secondary | ICD-10-CM | POA: Diagnosis not present

## 2020-04-22 DIAGNOSIS — M5136 Other intervertebral disc degeneration, lumbar region: Secondary | ICD-10-CM | POA: Diagnosis not present

## 2020-04-22 NOTE — Chronic Care Management (AMB) (Signed)
Chronic Care Management   Pharmacy Note  04/22/2020 Name: Kelly [name] MRN: [id number] DOB: [birth year]  Subjective:  Kelly [name] is a 79 y.o. year old female who is a primary care patient of Kelly [name], Kelly Adam, MD. The CCM team was consulted for assistance with chronic disease management and care coordination needs.    Engaged with patient by telephone for a return call in response to provider referral for pharmacy case management and/or care coordination services.   Consent to Services:  [name] was given information about Chronic Care Management services, agreed to services, and gave verbal consent prior to initiation of services on 11/15/2018 . Please see initial visit note for detailed documentation.   Objective:  Lab Results  Component Value Date   CREATININE 2.34 (H) 03/23/2020   CREATININE 2.34 (H) 03/02/2020   CREATININE 2.68 (H) 10/20/2019    Lab Results  Component Value Date   HGBA1C 7.5 (H) 03/23/2020       Component Value Date/Time   CHOL 107 04/22/2019 1055   TRIG 168.0 (H) 04/22/2019 1055   HDL 27.40 (L) 04/22/2019 1055   CHOLHDL 4 04/22/2019 1055   VLDL 33.6 04/22/2019 1055   LDLCALC 46 04/22/2019 1055   LDLDIRECT 78.0 02/18/2019 0857    BP Readings from Last 3 Encounters:  03/23/20 140/70  03/02/20 118/70  01/01/20 116/78    Assessment/Interventions: Review of patient past medical history, allergies, medications, health status, including review of consultants reports, laboratory and other test data, was performed as part of comprehensive evaluation and provision of chronic care management services.   SDOH (Social Determinants of Health) assessments and interventions performed:    CCM Care Plan  Allergies  Allergen Reactions  . Prilosec Otc [Omeprazole Magnesium] Other (See Comments)    Maybe cause of acute interstitial nephritis  . Sucralfate Other (See Comments)    Maybe cause of acute interstitial nephritis  .  Amoxicillin Diarrhea  . Morphine And Related Other (See Comments)    Chest pains Chest pains    Medications Reviewed Today    Reviewed by Barbaraann Faster, RN (Registered Nurse) on 03/30/20 at 1634  Med List Status: <None>  Medication Order Taking? Sig Documenting Provider Last Dose Status Informant  amLODipine (NORVASC) 5 MG tablet [id number] No Take 1 tablet (5 mg total) by mouth daily. Kelly Haven, MD Taking Active   aspirin EC 81 MG tablet [id number] No Take 81 mg by mouth daily. [provider] Taking Active Multiple Informants  atorvastatin (LIPITOR) 80 MG tablet [id number] No TAKE 1 TABLET DAILY Kelly Haven, MD Taking Active   calcitRIOL (ROCALTROL) 0.25 MCG capsule [id number] No Take 0.25 mcg by mouth daily. [provider] Taking Active   Calcium Citrate-Vitamin D (CALCIUM CITRATE +D PO) [id number] No Take 600 mg by mouth daily.  [provider] Taking Active   Cholecalciferol (VITAMIN D) 50 MCG (2000 UT) CAPS [id number] No Take 2,000 Units by mouth daily.  [provider] Taking Active   Continuous Blood Gluc Sensor (FREESTYLE LIBRE 2 SENSOR) MISC [id number] No Use to check sugar at least 4 times daily Kelly Haven, MD Taking Active   cyclobenzaprine (FLEXERIL) 10 MG tablet [id number] No TAKE 1/2 TABLET (5MG  TOTAL)3 TIMES A DAY AS NEEDED    FOR MUSCLE SPASMS Kelly Haven, MD Taking Active   denosumab (PROLIA) 60 MG/ML SOSY injection [id number] No Inject 60 mg into the skin every 6 (six) months. [provider] Taking Active  Med Note Erling Cruz, ANNA Angelica Ran Sep 24, 2018  1:07 PM) Last dose in April   docusate sodium (COLACE) 100 MG capsule [id number] No Take 100 mg by mouth daily.  [provider] Taking Active   escitalopram (LEXAPRO) 10 MG tablet [id number] No Take 0.5 tablets (5 mg total) by mouth daily for 14 days, THEN 1 tablet (10 mg total) daily. Kelly Haven, MD Taking Expired 03/20/20 2359             Med Note (Delyle Weider E   Fri Mar 20, 2020 11:49 AM) Taking 5 mg daily  furosemide (LASIX) 40 MG tablet [id number] No Take 1 tablet (40 mg total) by mouth daily as needed. Kelly Haven, MD Taking Active   gabapentin (NEURONTIN) 100 MG capsule [id number] No Take 100 mg by mouth 2 (two) times daily. [provider] Taking Active            Med Note [name], Arville Lime   Fri Mar 20, 2020 11:50 AM)    glucosamine-chondroitin 500-400 MG tablet [id number] No Take 1 tablet by mouth 3 (three) times daily. [provider] Taking Active   glucose blood (ACCU-CHEK AVIVA PLUS) test strip [id number] No Use to check blood sugar up to 4 times daily Kelly Haven, MD Taking Active   HYDROcodone-acetaminophen [facility]) 5-325 MG tablet [id number] No Norco 5 mg-325 mg tablet  Take 1 tablet twice a day by oral route as needed. [provider] Taking Active            Med Note [name] Dec 18, 2019  3:23 PM) Never takes more than 1 per day; takes ~3 times per week  Insulin Glargine (BASAGLAR KWIKPEN) 100 UNIT/ML [id number] No Inject 10 Units into the skin daily. Kelly Haven, MD Taking Active   Lancets MISC [id number] No Use up to 4 times daily to check blood sugars. Coral Spikes, DO Taking Active   losartan (COZAAR) 50 MG tablet [id number] No TAKE 1 TABLET DAILY Kelly Haven, MD Taking Active   Melatonin 5 MG CHEW [id number] No Chew 5 mg by mouth daily. [provider] Taking Active   potassium chloride (K-DUR) 10 MEQ tablet [id number] No potassium chloride ER 10 mEq tablet,extended release [provider] Taking Active            Med Note Thurmond Butts, CAROLINE E   Mon Nov 06, 2017 10:32 AM) Only takes when she takes furosemide  Semaglutide,0.25 or 0.5MG /DOS, (OZEMPIC, 0.25 OR 0.5 MG/DOSE,) 2 MG/1.5ML SOPN [id number] No Inject 0.5 mg into the skin once a week. Kelly Haven, MD Taking Active           Patient Active Problem  List   Diagnosis Date Noted  . Dysuria 03/02/2020  . Edema of lower extremity 11/19/2019  . Hyperlipidemia 11/15/2019  . Low back pain 11/15/2019  . Dry eyes 10/01/2019  . Tick bite 10/01/2019  . Vomiting 10/01/2019  . Personal history of other specified conditions 07/03/2019  . Fall 04/15/2019  . Type 2 diabetes mellitus ([place]) 03/18/2019  . Hypertension 03/18/2019  . Hyperparathyroidism due to renal insufficiency ([place]) 03/18/2019  . Headache disorder 09/10/2018  . Cervico-occipital neuralgia 07/23/2018  . Osteoporosis 07/23/2018  . Cramp and spasm 02/12/2018  . Paresthesia of hand 10/25/2017  . Lesion of rectum 09/07/2017  . Loss of height 08/15/2017  . 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 Cervical spondylosis without myelopathy 08/15/2017  .  DDD (degenerative disc disease), cervical 08/15/2017  . Bursitis of hip 08/15/2017  . Female stress incontinence 08/15/2017  . Muscle weakness 08/15/2017  . Neck pain 08/15/2017  . Spondylolisthesis, congenital 08/15/2017  . Radial styloid tenosynovitis 08/15/2017  . Senile osteoporosis 08/15/2017  . Hip pain 08/15/2017  . 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  . Rectal hemorrhage 07/25/2017  . Essential tremor 04/26/2017  . Other fatigue 03/22/2017  . Left ventricular hypertrophy 10/12/2016  . Chronic kidney disease, stage 4 (severe) ([place]) 08/26/2016  . Obstructive sleep apnea syndrome 08/26/2016    Conditions to be addressed/monitored per PCP order: HTN, HLD and DMII  Patient Care Plan: Medication Management    Problem Identified: Diabetes, CKD, Mental Health     Long-Range Goal: Disease Progression Prevention   Start Date: 03/02/2020  Expected End Date: 05/15/2020  Recent Progress: On track  Priority: High  Note:   Current Barriers:  . Unable to achieve control of diabetes  . Unable to independently control mental health diagnoses  Pharmacist Clinical Goal(s):  Marland Kitchen Over the next 90 days, patient will achieve  adherence to monitoring guidelines and medication adherence to achieve therapeutic efficacy. Marland Kitchen achieve control of diabetes as evidenced by A1c through collaboration with PharmD and provider.   Interventions: . Inter-disciplinary care team collaboration (see longitudinal plan of care) . Comprehensive medication review performed; medication list updated in electronic medical record  Diabetes: . Uncontrolled; current treatment: Ozempic 0.5 mg weekly (did not increase to 1 mg yet), Basaglar 10 units daily   . Notes that she hadn't vomited in a few days until today, when she vomited. Notes that she saw her orthopedic today, who gave her the impression that she should stop the Ozempic because "it is not good for kidneys".  . Reviewed that GLP1 are safe in renal disease, but if patient vomited enough to become dehydrated, this can be damaging to her kidneys. Stop Ozempic. Focus on hydration. Will call patient next week as previously scheduled   Hypertension: . Controlled per last clinic BP; current treatment: amlodipine 5 mg daily, losartan 50 mg daily . Furosemide 40 mg + potassium 10 mEq PRN lower extremity edema . Recommended continue current regimen  Hyperlipidemia, ASCVD risk reduction . Controlled; current treatment: atorvastatin 80 mg daily, ezetimibe 10 mg daily;  . Antiplatelet regimen: aspirin 81 mg daily . Recommended continue current regimen  Depression, anxiety: . Improved with dose reduction of escitalopram 5 mg. Notes that she is still tired, but anxiety has improved.  . Continue current regimen at this time  Osteoporosis: . Appropriately treated w/ Prolia Q6 months; calcium 1200 mg + Vitamin D 1000 units daily; calcitriol 0.25 mcg daily per nephrology   Chronic Pain - Degenerative Disc Disease, Arthritis: . PRN hydrocodone/APAP 5/325 mg PRN, cyclobenzaprine 10 mg daily, docusate 100 mg PRN  Patient Goals/Self-Care Activities . Over the next 90 days, patient will:  - take  medications as prescribed - check glucose at least QID using continuous glucose monitor, document, and provide at future appointments - target a minimum of 150 minutes of moderate intensity exercise weekly  Follow Up Plan: Telephone follow up appointment with care management team member scheduled for: ~ 1 week        Medication Assistance: Application for [employer] [facility]) medication assistance program in process. Anticipated assistance start date TBD. See plan of care above for additional detail.    Plan: Telephone follow up appointment with care management team member scheduled for: ~ 1 week  [name], PharmD, [place], [place] Clinical Pharmacist [employer] at [place]

## 2020-04-22 NOTE — Patient Instructions (Signed)
Visit Information  Patient Care Plan: Medication Management    Problem Identified: Diabetes, CKD, Mental Health     Long-Range Goal: Disease Progression Prevention   Start Date: 03/02/2020  Expected End Date: 05/15/2020  Recent Progress: On track  Priority: High  Note:   Current Barriers:  . Unable to achieve control of diabetes  . Unable to independently control mental health diagnoses  Pharmacist Clinical Goal(s):  Marland Kitchen Over the next 90 days, patient will achieve adherence to monitoring guidelines and medication adherence to achieve therapeutic efficacy. Marland Kitchen achieve control of diabetes as evidenced by A1c through collaboration with PharmD and provider.   Interventions: . Inter-disciplinary care team collaboration (see longitudinal plan of care) . Comprehensive medication review performed; medication list updated in electronic medical record  Diabetes: . Uncontrolled; current treatment: Ozempic 0.5 mg weekly (did not increase to 1 mg yet), Basaglar 10 units daily   . Notes that she hadn't vomited in a few days until today, when she vomited. Notes that she saw her orthopedic today, who gave her the impression that she should stop the Ozempic because "it is not good for kidneys".  . Reviewed that GLP1 are safe in renal disease, but if patient vomited enough to become dehydrated, this can be damaging to her kidneys. Stop Ozempic. Focus on hydration. Will call patient next week as previously scheduled   Hypertension: . Controlled per last clinic BP; current treatment: amlodipine 5 mg daily, losartan 50 mg daily . Furosemide 40 mg + potassium 10 mEq PRN lower extremity edema . Recommended continue current regimen  Hyperlipidemia, ASCVD risk reduction . Controlled; current treatment: atorvastatin 80 mg daily, ezetimibe 10 mg daily;  . Antiplatelet regimen: aspirin 81 mg daily . Recommended continue current regimen  Depression, anxiety: . Improved with dose reduction of escitalopram 5 mg.  Notes that she is still tired, but anxiety has improved.  . Continue current regimen at this time  Osteoporosis: . Appropriately treated w/ Prolia Q6 months; calcium 1200 mg + Vitamin D 1000 units daily; calcitriol 0.25 mcg daily per nephrology   Chronic Pain - Degenerative Disc Disease, Arthritis: . PRN hydrocodone/APAP 5/325 mg PRN, cyclobenzaprine 10 mg daily, docusate 100 mg PRN  Patient Goals/Self-Care Activities . Over the next 90 days, patient will:  - take medications as prescribed - check glucose at least QID using continuous glucose monitor, document, and provide at future appointments - target a minimum of 150 minutes of moderate intensity exercise weekly  Follow Up Plan: Telephone follow up appointment with care management team member scheduled for: ~ 1 week         The patient verbalized understanding of instructions, educational materials, and care plan provided today and declined offer to receive copy of patient instructions, educational materials, and care plan.   Plan: Telephone follow up appointment with care management team member scheduled for: ~ 1 week  [name], PharmD, [place], [place] Clinical Pharmacist [employer] at [employer] [phone]

## 2020-04-23 ENCOUNTER — Telehealth: Payer: Self-pay | Admitting: Family Medicine

## 2020-04-23 NOTE — Telephone Encounter (Signed)
I called and spoke with the patients daughter and informed her to stop the zoloft and the Ozempic, she is to continue the basglar 10 units and log her sugars while off the ozempic until her visit by phone with catie next week and they understood.  Galilea Quito,cma

## 2020-04-23 NOTE — Telephone Encounter (Signed)
Pt daughter would like a call back about the Semaglutide,0.25 or 0.5MG /DOS, (OZEMPIC, 0.25 OR 0.5 MG/DOSE and how it is making her feel

## 2020-04-23 NOTE — Telephone Encounter (Addendum)
Error. ng 

## 2020-04-23 NOTE — Telephone Encounter (Signed)
Noted. She needs to stop the Zoloft. I also thought Kelly [name] discussed having her stop the ozempic as well. If her symptoms aren't improving with stopping those she needs to be seen in person for further evaluation.

## 2020-04-28 DIAGNOSIS — M4803 Spinal stenosis, cervicothoracic region: Secondary | ICD-10-CM | POA: Diagnosis not present

## 2020-04-28 DIAGNOSIS — M4802 Spinal stenosis, cervical region: Secondary | ICD-10-CM | POA: Diagnosis not present

## 2020-04-28 DIAGNOSIS — M47812 Spondylosis without myelopathy or radiculopathy, cervical region: Secondary | ICD-10-CM | POA: Diagnosis not present

## 2020-04-29 ENCOUNTER — Ambulatory Visit: Payer: Medicare Other | Admitting: Pharmacist

## 2020-04-29 DIAGNOSIS — E1122 Type 2 diabetes mellitus with diabetic chronic kidney disease: Secondary | ICD-10-CM

## 2020-04-29 DIAGNOSIS — Z794 Long term (current) use of insulin: Secondary | ICD-10-CM

## 2020-04-29 DIAGNOSIS — E785 Hyperlipidemia, unspecified: Secondary | ICD-10-CM

## 2020-04-29 DIAGNOSIS — N184 Chronic kidney disease, stage 4 (severe): Secondary | ICD-10-CM

## 2020-04-29 NOTE — Chronic Care Management (AMB) (Signed)
Chronic Care Management   Pharmacy Note  04/29/2020 Name: Kelly [name] MRN: [id number] DOB: 1941/02/01  Subjective:  Kelly [name] is a 79 y.o. year old female who is a primary care patient of Caryl Bis, Angela Adam, MD. The CCM team was consulted for assistance with chronic disease management and care coordination needs.    Engaged with patient by telephone for follow up visit in response to provider referral for pharmacy case management and/or care coordination services.   Consent to Services:  Ms. Ziolkowski was given information about Chronic Care Management services, agreed to services, and gave verbal consent prior to initiation of services on 11/12/2018. Please see initial visit note for detailed documentation.   Objective:  Lab Results  Component Value Date   CREATININE 2.34 (H) 03/23/2020   CREATININE 2.34 (H) 03/02/2020   CREATININE 2.68 (H) 10/20/2019    Lab Results  Component Value Date   HGBA1C 7.5 (H) 03/23/2020       Component Value Date/Time   CHOL 107 04/22/2019 1055   TRIG 168.0 (H) 04/22/2019 1055   HDL 27.40 (L) 04/22/2019 1055   CHOLHDL 4 04/22/2019 1055   VLDL 33.6 04/22/2019 1055   LDLCALC 46 04/22/2019 1055   LDLDIRECT 78.0 02/18/2019 0857    Clinical ASCVD: No  The ASCVD Risk score Mikey Bussing DC Jr., et al., 2013) failed to calculate for the following reasons:   The valid total cholesterol range is 130 to 320 mg/dL      BP Readings from Last 3 Encounters:  03/23/20 140/70  03/02/20 118/70  01/01/20 116/78    Assessment/Interventions: Review of patient past medical history, allergies, medications, health status, including review of consultants reports, laboratory and other test data, was performed as part of comprehensive evaluation and provision of chronic care management services.   SDOH (Social Determinants of Health) assessments and interventions performed:    CCM Care Plan  Allergies  Allergen Reactions   Prilosec Otc  [Omeprazole Magnesium] Other (See Comments)    Maybe cause of acute interstitial nephritis   Sucralfate Other (See Comments)    Maybe cause of acute interstitial nephritis   Amoxicillin Diarrhea   Morphine And Related Other (See Comments)    Chest pains Chest pains    Medications Reviewed Today    Reviewed by [name], RPH-CPP (Pharmacist) on 04/29/20 at 32  Med List Status: <None>  Medication Order Taking? Sig Documenting Provider Last Dose Status Informant  amLODipine (NORVASC) 5 MG tablet [id number] Yes Take 1 tablet (5 mg total) by mouth daily. [name], MD Taking Active   aspirin EC 81 MG tablet [id number] Yes Take 81 mg by mouth daily. [provider] Taking Active Multiple Informants  atorvastatin (LIPITOR) 80 MG tablet [id number] Yes TAKE 1 TABLET DAILY [name], MD Taking Active   calcitRIOL (ROCALTROL) 0.25 MCG capsule [id number] Yes Take 0.25 mcg by mouth daily. [provider] Taking Active   Calcium Citrate-Vitamin D (CALCIUM CITRATE +D PO) [id number] Yes Take 600 mg by mouth daily.  [provider] Taking Active   Cholecalciferol (VITAMIN D) 50 MCG (2000 UT) CAPS [id number] Yes Take 2,000 Units by mouth daily.  [provider] Taking Active   Continuous Blood Gluc Sensor (FREESTYLE LIBRE 2 SENSOR) [place] [id number]  Use to check sugar at least 4 times daily [name], MD  Active   cyclobenzaprine (FLEXERIL) 10 MG tablet [id number] Yes TAKE 1/2 TABLET (5MG  TOTAL)3 TIMES A DAY AS NEEDED  FOR MUSCLE SPASMS [name], MD Taking Active   denosumab (PROLIA) 60 MG/ML SOSY injection [id number] Yes Inject 60 mg into the skin every 6 (six) months. [provider] Taking Active            Med Note Erling Cruz, Adria Dill Sep 24, 2018  1:07 PM) Last dose in April   docusate sodium (COLACE) 100 MG capsule [id number] Yes Take 100 mg by mouth daily.  [provider] Taking Active   escitalopram  (LEXAPRO) 10 MG tablet [id number] No Take 0.5 tablets (5 mg total) by mouth daily for 14 days, THEN 1 tablet (10 mg total) daily.  Patient not taking: Reported on 04/29/2020   [name], MD Not Taking Active            Med Note Barbaraann Faster   Tue Apr 14, 2020  5:33 PM) 04/14/20 No longer taking Lexapro Now on Zoloft 50 mg qd  Has been taking zoloft for 2 weeks  furosemide (LASIX) 40 MG tablet [id number] Yes Take 1 tablet (40 mg total) by mouth daily as needed. [name], MD Taking Active   gabapentin (NEURONTIN) 100 MG capsule [id number] Yes Take 1 capsule (100 mg total) by mouth 2 (two) times daily. [name], MD Taking Active   glucosamine-chondroitin 500-400 MG tablet [id number] Yes Take 1 tablet by mouth 3 (three) times daily. [provider] Taking Active   glucose blood (ACCU-CHEK AVIVA PLUS) test strip [id number] Yes Use to check blood sugar up to 4 times daily [name], MD Taking Active   HYDROcodone-acetaminophen (NORCO/VICODIN) 5-325 MG tablet [id number] Yes Norco 5 mg-325 mg tablet  Take 1 tablet twice a day by oral route as needed. [provider] Taking Active            Med Note [name], [name] Dec 18, 2019  3:23 PM) Never takes more than 1 per day; takes ~3 times per week  Insulin Glargine [facility] KWIKPEN) 100 UNIT/ML [id number] No INJECT 20 UNITS TOTAL      SUBCUTANEOUSLY DAILY  Patient not taking: Reported on 04/29/2020   [name], MD Not Taking Active            Med Note [name]   Wed Apr 29, 2020  1:13 PM) 10 units  Lancets MISC [id number] Yes Use up to 4 times daily to check blood sugars. Coral Spikes, DO Taking Active   losartan (COZAAR) 50 MG tablet [id number] Yes TAKE 1 TABLET DAILY [name], MD Taking Active   Melatonin 5 MG CHEW [id number] Yes Chew 5 mg by mouth daily. [provider] Taking Active   potassium chloride (K-DUR) 10 MEQ tablet [id number] Yes potassium  chloride ER 10 mEq tablet,extended release [provider] Taking Active            Med Note Thurmond Butts, CAROLINE E   Mon Nov 06, 2017 10:32 AM) Only takes when she takes furosemide  sertraline (ZOLOFT) 50 MG tablet [id number] No Take 1 tablet (50 mg total) by mouth daily.  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/18/2019   Headache disorder 09/10/2018   Cervico-occipital neuralgia 07/23/2018   Osteoporosis 07/23/2018   Cramp and spasm 02/12/2018   Paresthesia of hand 10/25/2017   Lesion of rectum 09/07/2017   Loss of height 08/15/2017   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   Cervical spondylosis without myelopathy 08/15/2017   DDD (degenerative disc disease), cervical 08/15/2017   Bursitis of hip 08/15/2017   Female stress incontinence 08/15/2017   Muscle weakness 08/15/2017   Neck pain 08/15/2017   Spondylolisthesis, congenital 08/15/2017   Radial styloid tenosynovitis 08/15/2017   Senile osteoporosis 08/15/2017   Hip pain 08/15/2017   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   Rectal hemorrhage 07/25/2017   Essential tremor 04/26/2017   Other fatigue 03/22/2017   Left ventricular hypertrophy 10/12/2016   Chronic kidney disease, stage 4 (severe) ([place]) 08/26/2016   Obstructive sleep apnea syndrome 08/26/2016    Conditions to be addressed/monitored per PCP order: HTN, HLD and COPD  Patient Care Plan: Medication Management     Problem Identified: Diabetes, CKD, Mental Health     Long-Range Goal: Disease Progression Prevention   Start Date: 03/02/2020  Expected End Date: 05/15/2020  Recent Progress: On track  Priority: High  Note:   Current Barriers:   Unable to achieve control of diabetes   Unable to independently control mental health diagnoses  Pharmacist Clinical Goal(s):   Over the next 90 days, patient will achieve adherence to monitoring guidelines and medication adherence to achieve therapeutic efficacy.  Over the next 90 days, patient will a achieve control of diabetes as evidenced by A1c through collaboration with PharmD and provider.   Interventions:  1:1 collaboration with [name], MD regarding development and update of comprehensive plan of care as evidenced by provider attestation and co-signature  Inter-disciplinary care team collaboration (see longitudinal plan of care)  Comprehensive medication review performed; medication list updated in electronic medical record  Diabetes:  Uncontrolled; current treatment: Basaglar 10 units daily, though has been holding for the past week due to glucose <70. Does report that diarrhea "started" the day that she stopped Ozempic, which was 04/24/20.  o Hx nausea, vomiting (was nauseous or vomited at least 50% of the days in November) with Ozempic. Did not notify the team of this until 04/17/20.  Continue to hold Basaglar given episodes of hypoglycemia, at least until patient's GI complaints have resolved. Advised to seek care if diarrhea worsens or nausea resurfaces. Discussed concern of dehydration and importance to remain well hydrated. Advised to call me or call the office if hyperglycemia consistently >200 develops.   Will f/u on glucose at provider visit in 2 weeks (see depression)  Hypertension:  Controlled per last clinic BP; current treatment: amlodipine 5 mg daily, losartan 50 mg daily  Furosemide 40 mg + potassium 10 mEq PRN  lower extremity edema  Recommended continue current regimen  Hyperlipidemia, ASCVD risk reduction  Controlled; current treatment: atorvastatin 80 mg daily, ezetimibe 10 mg daily;   Antiplatelet regimen: aspirin 81 mg daily  Recommended continue current regimen  Depression, anxiety:  CMA discussed with patient's daughter, daughter requested that sertraline be discontinued as she believes it was contributing to patient's nausea/vomiting and mood concerns. Patient discontinued sertraline 04/24/20 as per PCP recommendation.  Patient notes today that she needs  another antidepressant d/t episodes of crying. Denies SI/HI. Scheduled for a sooner appointment with PCP in ~ 2 weeks in next available slot.   Osteoporosis:  Appropriately treated w/ Prolia Q6 months; calcium 1200 mg + Vitamin D 1000 units daily; calcitriol 0.25 mcg daily per nephrology   Chronic Pain - Degenerative Disc Disease, Arthritis:  PRN hydrocodone/APAP 5/325 mg PRN, cyclobenzaprine 10 mg daily, docusate 100 mg PRN  Patient Goals/Self-Care Activities  Over the next 90 days, patient will:  - take medications as prescribed - check glucose at least QID using continuous glucose monitor, document, and provide at future appointments - target a minimum of 150 minutes of moderate intensity exercise weekly  Follow Up Plan: Telephone follow up appointment with care management team member scheduled for: ~ 3 weeks        Medication Assistance: None required. Patient affirms current coverage meets needs.   Plan: Telephone follow up appointment with care management team member scheduled for: ~ 3 weeks  [name], PharmD, [place], [place] Clinical Pharmacist [employer] at [employer] [phone]

## 2020-04-29 NOTE — Patient Instructions (Signed)
Visit Information  Patient Care Plan: Medication Management    Problem Identified: Diabetes, CKD, Mental Health     Long-Range Goal: Disease Progression Prevention   Start Date: 03/02/2020  Expected End Date: 05/15/2020  Recent Progress: On track  Priority: High  Note:   Current Barriers:  . Unable to achieve control of diabetes  . Unable to independently control mental health diagnoses  Pharmacist Clinical Goal(s):  Marland Kitchen Over the next 90 days, patient will achieve adherence to monitoring guidelines and medication adherence to achieve therapeutic efficacy. . Over the next 90 days, patient will a achieve control of diabetes as evidenced by A1c through collaboration with PharmD and provider.   Interventions: . 1:1 collaboration with [name], MD regarding development and update of comprehensive plan of care as evidenced by provider attestation and co-signature . Inter-disciplinary care team collaboration (see longitudinal plan of care) . Comprehensive medication review performed; medication list updated in electronic medical record  Diabetes: . Uncontrolled; current treatment: Basaglar 10 units daily, though has been holding for the past week due to glucose <70. Does report that diarrhea "started" the day that she stopped Ozempic, which was 04/24/20.  o Hx nausea, vomiting (was nauseous or vomited at least 50% of the days in November) with Ozempic. Did not notify the team of this until 04/17/20. Marland Kitchen Continue to hold Basaglar given episodes of hypoglycemia, at least until patient's GI complaints have resolved. Advised to seek care if diarrhea worsens or nausea resurfaces. Discussed concern of dehydration and importance to remain well hydrated. Advised to call me or call the office if hyperglycemia consistently >200 develops.  . Will f/u on glucose at provider visit in 2 weeks (see depression)  Hypertension: . Controlled per last clinic BP; current treatment: amlodipine 5 mg daily,  losartan 50 mg daily . Furosemide 40 mg + potassium 10 mEq PRN lower extremity edema . Recommended continue current regimen  Hyperlipidemia, ASCVD risk reduction . Controlled; current treatment: atorvastatin 80 mg daily, ezetimibe 10 mg daily;  . Antiplatelet regimen: aspirin 81 mg daily . Recommended continue current regimen  Depression, anxiety: . CMA discussed with patient's daughter, daughter requested that sertraline be discontinued as she believes it was contributing to patient's nausea/vomiting and mood concerns. Patient discontinued sertraline 04/24/20 as per PCP recommendation. . Patient notes today that she needs another antidepressant d/t episodes of crying. Denies SI/HI. Scheduled for a sooner appointment with PCP in ~ 2 weeks in next available slot.   Osteoporosis: . Appropriately treated w/ Prolia Q6 months; calcium 1200 mg + Vitamin D 1000 units daily; calcitriol 0.25 mcg daily per nephrology   Chronic Pain - Degenerative Disc Disease, Arthritis: . PRN hydrocodone/APAP 5/325 mg PRN, cyclobenzaprine 10 mg daily, docusate 100 mg PRN  Patient Goals/Self-Care Activities . Over the next 90 days, patient will:  - take medications as prescribed - check glucose at least QID using continuous glucose monitor, document, and provide at future appointments - target a minimum of 150 minutes of moderate intensity exercise weekly  Follow Up Plan: Telephone follow up appointment with care management team member scheduled for: ~ 3 weeks       The patient verbalized understanding of instructions, educational materials, and care plan provided today and declined offer to receive copy of patient instructions, educational materials, and care plan.    Plan: Telephone follow up appointment with care management team member scheduled for: ~ 3 weeks  [name], PharmD, [place], [place] Clinical Pharmacist [employer] at [employer] [phone]

## 2020-05-12 ENCOUNTER — Other Ambulatory Visit: Payer: Self-pay

## 2020-05-13 ENCOUNTER — Encounter: Payer: Self-pay | Admitting: Family Medicine

## 2020-05-13 ENCOUNTER — Ambulatory Visit (INDEPENDENT_AMBULATORY_CARE_PROVIDER_SITE_OTHER): Payer: Medicare Other | Admitting: Family Medicine

## 2020-05-13 ENCOUNTER — Other Ambulatory Visit: Payer: Self-pay

## 2020-05-13 DIAGNOSIS — R1110 Vomiting, unspecified: Secondary | ICD-10-CM

## 2020-05-13 DIAGNOSIS — F418 Other specified anxiety disorders: Secondary | ICD-10-CM

## 2020-05-13 DIAGNOSIS — E1122 Type 2 diabetes mellitus with diabetic chronic kidney disease: Secondary | ICD-10-CM | POA: Diagnosis not present

## 2020-05-13 DIAGNOSIS — Z794 Long term (current) use of insulin: Secondary | ICD-10-CM | POA: Diagnosis not present

## 2020-05-13 DIAGNOSIS — N184 Chronic kidney disease, stage 4 (severe): Secondary | ICD-10-CM | POA: Diagnosis not present

## 2020-05-13 MED ORDER — DULOXETINE HCL 30 MG PO CPEP
30.0000 mg | ORAL_CAPSULE | Freq: Every day | ORAL | 3 refills | Status: DC
Start: 2020-05-13 — End: 2020-06-10

## 2020-05-13 NOTE — Patient Instructions (Signed)
Nice to see you. We will start you on cymbalta for your depression.  If you notice any side effects from this please let us know. Please continue with your Basaglar 10 units daily.  Please send me a list of your glucoses in about a week.

## 2020-05-13 NOTE — Assessment & Plan Note (Signed)
Glucose has trended down some since restarting Basaglar.  She will continue on Basaglar 10 units once daily.  She will remain off of Ozempic given side effects.  I did discuss that the renal dysfunction with use of Ozempic is typically seen in patients who are having vomiting, diarrhea, or dehydration and thus I did feel that it was previously safe for her to use though given her side effects we could not use it moving forward.  They will contact me in about a week to let me know what her sugars have been doing and we could consider increasing her Basaglar.

## 2020-05-13 NOTE — Progress Notes (Signed)
Tommi Rumps, MD Phone: [phone]  Kelly [name] is a 80 y.o. female who presents today for follow-up.  Depression: This continues to be an issue.  She gets tearful fairly easily.  She was unable to tolerate the Zoloft given possible side effects of nausea, vomiting, and diarrhea though those could have been related to other medications.  Lexapro was not helpful.  She was on Paxil previously though the side effect profile made it more beneficial to switch to something else.  No SI.  Vomiting: This was related to the Ozempic.  She is no longer taking this.  She was concerned about this affecting her kidney function as her orthopedist told her that she should not be on it with her kidney function.  Diabetes: She came off of Ozempic and subsequently stopped Basaglar as her glucose was getting low.  Since coming off of the [place] her glucose has rebounded to being high.  She restarted her Basaglar 3 to 4 days ago and notes her blood glucose has been trending down with this.  No polyuria or polydipsia.  No recent hypoglycemia.  Social History   Tobacco Use  Smoking Status Never Smoker  Smokeless Tobacco Never Used    Current Outpatient Medications on File Prior to Visit  Medication Sig Dispense Refill  . amLODipine (NORVASC) 5 MG tablet Take 1 tablet (5 mg total) by mouth daily. 90 tablet 3  . aspirin EC 81 MG tablet Take 81 mg by mouth daily.    Marland Kitchen atorvastatin (LIPITOR) 80 MG tablet TAKE 1 TABLET DAILY 90 tablet 1  . calcitRIOL (ROCALTROL) 0.25 MCG capsule Take 0.25 mcg by mouth daily.    . Calcium Citrate-Vitamin D (CALCIUM CITRATE +D PO) Take 600 mg by mouth daily.     . Cholecalciferol (VITAMIN D) 50 MCG (2000 UT) CAPS Take 2,000 Units by mouth daily.     . Continuous Blood Gluc Sensor (FREESTYLE LIBRE 2 SENSOR) MISC Use to check sugar at least 4 times daily 6 each 3  . cyclobenzaprine (FLEXERIL) 10 MG tablet TAKE 1/2 TABLET (5MG  TOTAL)3 TIMES A DAY AS NEEDED    FOR MUSCLE  SPASMS 45 tablet 0  . denosumab (PROLIA) 60 MG/ML SOSY injection Inject 60 mg into the skin every 6 (six) months.    . furosemide (LASIX) 40 MG tablet Take 1 tablet (40 mg total) by mouth daily as needed. 90 tablet 0  . gabapentin (NEURONTIN) 100 MG capsule Take 1 capsule (100 mg total) by mouth 2 (two) times daily. 180 capsule 1  . glucosamine-chondroitin 500-400 MG tablet Take 1 tablet by mouth 3 (three) times daily.    Marland Kitchen glucose blood (ACCU-CHEK AVIVA PLUS) test strip Use to check blood sugar up to 4 times daily 100 each 3  . HYDROcodone-acetaminophen (NORCO/VICODIN) 5-325 MG tablet Norco 5 mg-325 mg tablet  Take 1 tablet twice a day by oral route as needed.    . Insulin Glargine (BASAGLAR KWIKPEN) 100 UNIT/ML INJECT 20 UNITS TOTAL      SUBCUTANEOUSLY DAILY 15 mL 0  . Lancets MISC Use up to 4 times daily to check blood sugars. 200 each 11  . losartan (COZAAR) 50 MG tablet TAKE 1 TABLET DAILY 90 tablet 1  . Melatonin 5 MG CHEW Chew 5 mg by mouth daily.    . potassium chloride (K-DUR) 10 MEQ tablet potassium chloride ER 10 mEq tablet,extended release     No current facility-administered medications on file prior to visit.     ROS  see history of present illness  Objective  Physical Exam Vitals:   05/13/20 1356  BP: 128/76  Pulse: 74  Temp: 97.7 F (36.5 C)  SpO2: 97%    BP Readings from Last 3 Encounters:  05/13/20 128/76  03/23/20 140/70  03/02/20 118/70   Wt Readings from Last 3 Encounters:  05/13/20 125 lb (56.7 kg)  04/14/20 128 lb (58.1 kg)  03/23/20 132 lb (59.9 kg)    Physical Exam Constitutional:      General: She is not in acute distress.    Appearance: She is not diaphoretic.  Cardiovascular:     Rate and Rhythm: Normal rate and regular rhythm.     Heart sounds: Normal heart sounds.  Pulmonary:     Effort: Pulmonary effort is normal.     Breath sounds: Normal breath sounds.  Musculoskeletal:        General: No edema.  Skin:    General: Skin is warm  and dry.  Neurological:     Mental Status: She is alert.  Psychiatric:     Comments: Mood depressed, intermittently tearful, affect flat      Assessment/Plan: Please see individual problem list.  Problem List Items Addressed This Visit    Mixed anxiety and depressive disorder    The patient continues to have symptoms.  We will start on Cymbalta 30 mg daily.  She will monitor for side effects and let us know if they occur.  I did discuss that if this is not beneficial for her or if she is unable to tolerate it we would likely have to consider a psychiatry referral.      Relevant Medications   DULoxetine (CYMBALTA) 30 MG capsule   Type 2 diabetes mellitus ([place])    Glucose has trended down some since restarting Basaglar.  She will continue on Basaglar 10 units once daily.  She will remain off of Ozempic given side effects.  I did discuss that the renal dysfunction with use of Ozempic is typically seen in patients who are having vomiting, diarrhea, or dehydration and thus I did feel that it was previously safe for her to use though given her side effects we could not use it moving forward.  They will contact me in about a week to let me know what her sugars have been doing and we could consider increasing her Basaglar.      Vomiting    Suspect this is related to the Ozempic.  She will remain off of this.  The symptoms have resolved.         This visit occurred during the SARS-CoV-2 public health emergency.  Safety protocols were in place, including screening questions prior to the visit, additional usage of staff PPE, and extensive cleaning of exam room while observing appropriate contact time as indicated for disinfecting solutions.    Tommi Rumps, MD [place]

## 2020-05-13 NOTE — Assessment & Plan Note (Signed)
Suspect this is related to the [place].  She will remain off of this.  The symptoms have resolved.

## 2020-05-13 NOTE — Assessment & Plan Note (Signed)
The patient continues to have symptoms.  We will start on Cymbalta 30 mg daily.  She will monitor for side effects and let us know if they occur.  I did discuss that if this is not beneficial for her or if she is unable to tolerate it we would likely have to consider a psychiatry referral.

## 2020-05-15 ENCOUNTER — Other Ambulatory Visit: Payer: Self-pay | Admitting: *Deleted

## 2020-05-15 NOTE — Patient Outreach (Signed)
[facility]) Care Management  05/15/2020  Kelly [name] 10/28/1940 [id number]   [facility] unsuccessful Telephone Assessment/Screen for MD referral  Referral Date:02/19/20 Referral Source:MD referral, [name] [facility] embedded pharmacy staff Referral Reason:Disease management education and mental health support Catie-I've worked with this patient for ~2 years now on DM, making significant improvements. 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 Tearful during visit today about what she can do to prevent decline. Not much support/anybody to talk to at home besides her daughter, who is causing more worry. Would appreciate diet/disease education, support, and definitely mental health support.  Insurance:blue cross and blue shield  [facility] Unsuccessful outreach   Outreach attempt to the home number  No answer. [facility] RN CM left HIPAA [facility] Portability and Accountability Act) compliant voicemail message along with CM's contact info.   Plan: [facility] RN CM scheduled this patient for another call attempt within 7-14 business days  Luella Gardenhire L. Lavina Hamman, RN, BSN, [place] Coordinator Office number [phone] Mobile number [phone]  Main [facility] number [phone] Fax number [phone]

## 2020-05-16 ENCOUNTER — Other Ambulatory Visit: Payer: Self-pay | Admitting: Family Medicine

## 2020-05-18 ENCOUNTER — Other Ambulatory Visit: Payer: Self-pay

## 2020-05-19 ENCOUNTER — Ambulatory Visit: Payer: Medicare Other

## 2020-05-19 ENCOUNTER — Telehealth: Payer: Self-pay | Admitting: Pharmacist

## 2020-05-19 NOTE — Telephone Encounter (Signed)
  Chronic Care Management   Note  05/19/2020 Name: Kelly [name] MRN: [id number] DOB: 1940/05/30  Daughter calls today to report concerns regarding glucose management prior to our appointment tomorrow. Patient's daughter is on [place].   Reports that patient has not been consistently taking Basaglar in the morning as previously discussed. Reports that she will often take later in the day. We discussed that basal insulins do not specifically have to be taken before meals, but that the important thing is to focus on taking it consistently. Patient's daughter reports that she feels her mother's post prandial sugar readings are better if she takes Engineer, agricultural before meals.   Also noted that she has an upcoming orthopedic injection and she anticipated her mother may receive another steroid injection. She worries about resultant hyperglycemia. She reminds me that patient has glipizide at home, but that she would only like patient to be on for a short period of time if needed. I encouraged her to have patient call us after orthopedic appointment if she is given a steroid injection.   Reports increased agitation in her mom's attitude that she believes is related to hyperglycemia recently.   Reports diet high in carbs lately - fries, pancakes, milkshakes. Likely related to having felt so bad on Ozempic that she is over eating now. Reviewed that patient had tolerated GLP1 Victoza previously. Reviewed that patient and I made a joint decision to switch from Victoza to Ozempic to target improved glycemic benefit.   Also again reports that she doesn't feel the patient needs an antidepressant. Expressed concerns about patient being on both duloxetine and gabapentin, as she knows duloxetine is for chronic pain as well. Reviewed that we would continue to monitor response to therapy, and we could consider reduction in dose of gabapentin if patient desires.   Will follow with patient tomorrow as scheduled.   Catie  [name], PharmD, [place], [place] Clinical Pharmacist [employer] at [place]

## 2020-05-20 ENCOUNTER — Other Ambulatory Visit: Payer: Self-pay

## 2020-05-20 ENCOUNTER — Ambulatory Visit: Payer: Medicare Other | Admitting: Pharmacist

## 2020-05-20 DIAGNOSIS — Z794 Long term (current) use of insulin: Secondary | ICD-10-CM

## 2020-05-20 DIAGNOSIS — E1122 Type 2 diabetes mellitus with diabetic chronic kidney disease: Secondary | ICD-10-CM

## 2020-05-20 MED ORDER — VICTOZA 18 MG/3ML ~~LOC~~ SOPN: 0.6000 mg | PEN_INJECTOR | Freq: Every day | SUBCUTANEOUS | 1 refills | Status: DC

## 2020-05-20 MED ORDER — BASAGLAR KWIKPEN 100 UNIT/ML ~~LOC~~ SOPN: 8.0000 [IU] | PEN_INJECTOR | Freq: Every day | SUBCUTANEOUS | 0 refills | Status: DC

## 2020-05-20 NOTE — Chronic Care Management (AMB) (Signed)
Chronic Care Management Pharmacy Note  05/20/2020 Name:  Kelly [name] MRN:  [id number] DOB:  [birth year]  Subjective: Kelly [name] is an 80 y.o. year old female who is a primary patient of Sonnenberg, Angela Adam, MD.  The CCM team was consulted for assistance with disease management and care coordination needs.    Engaged with patient face to face for follow up visit in response to provider referral for pharmacy case management and/or care coordination services.   Consent to Services:  The patient was given information about Chronic Care Management services, agreed to services, and gave verbal consent prior to initiation of services.  Please see initial visit note for detailed documentation.   Objective:  Lab Results  Component Value Date   CREATININE 2.34 (H) 03/23/2020   CREATININE 2.34 (H) 03/02/2020   CREATININE 2.68 (H) 10/20/2019    Lab Results  Component Value Date   HGBA1C 7.5 (H) 03/23/2020       Component Value Date/Time   CHOL 107 04/22/2019 1055   TRIG 168.0 (H) 04/22/2019 1055   HDL 27.40 (L) 04/22/2019 1055   CHOLHDL 4 04/22/2019 1055   VLDL 33.6 04/22/2019 1055   LDLCALC 46 04/22/2019 1055   LDLDIRECT 78.0 02/18/2019 0857    BP Readings from Last 3 Encounters:  05/13/20 128/76  03/23/20 140/70  03/02/20 118/70        Assessment/Interventions: Review of patient past medical history, allergies, medications, health status, including review of consultants reports, laboratory and other test data, was performed as part of comprehensive evaluation and provision of chronic care management services.   SDOH:  (Social Determinants of Health) assessments and interventions performed:  SDOH Interventions   Flowsheet Row Most Recent Value  SDOH Interventions   SDOH Interventions for the Following Domains Depression  Financial Strain Interventions Intervention Not Indicated  Stress Interventions Intervention Not Indicated  Depression  Interventions/Treatment  Currently on Treatment      CCM Care Plan  Allergies  Allergen Reactions  . Prilosec Otc [Omeprazole Magnesium] Other (See Comments)    Maybe cause of acute interstitial nephritis  . Sucralfate Other (See Comments)    Maybe cause of acute interstitial nephritis  . Amoxicillin Diarrhea  . Morphine And Related Other (See Comments)    Chest pains Chest pains    Medications Reviewed Today    Reviewed by [name], RPH-CPP (Pharmacist) on 05/20/20 at 1430  Med List Status: <None>  Medication Order Taking? Sig Documenting Provider Last Dose Status Informant  amLODipine (NORVASC) 5 MG tablet [id number] Yes Take 1 tablet (5 mg total) by mouth daily. [name], MD Taking Active   aspirin EC 81 MG tablet [id number] Yes Take 81 mg by mouth daily. [provider] Taking Active Multiple Informants  atorvastatin (LIPITOR) 80 MG tablet [id number] Yes TAKE 1 TABLET DAILY [name], MD Taking Active   calcitRIOL (ROCALTROL) 0.25 MCG capsule [id number] Yes Take 0.25 mcg by mouth daily. [provider] Taking Active   Calcium Citrate-Vitamin D (CALCIUM CITRATE +D PO) [id number] Yes Take 600 mg by mouth daily.  [provider] Taking Active   Cholecalciferol (VITAMIN D) 50 MCG (2000 UT) CAPS [id number] Yes Take 2,000 Units by mouth daily.  [provider] Taking Active   Continuous Blood Gluc Sensor (FREESTYLE LIBRE 2 SENSOR) [place] [id number] Yes Use to check sugar at least 4 times daily [name], MD Taking Active   cyclobenzaprine (FLEXERIL) 10 MG tablet [id number] Yes TAKE 1/2  TABLET (5MG  TOTAL)3 TIMES A DAY AS NEEDED    FOR MUSCLE SPASMS [name], MD Taking Active   denosumab (PROLIA) 60 MG/ML SOSY injection [id number] Yes Inject 60 mg into the skin every 6 (six) months. [provider] Taking Active            Med Note [name], [name] May 20, 2020  2:28 PM)    DULoxetine (CYMBALTA)  30 MG capsule [id number] Yes Take 1 capsule (30 mg total) by mouth daily. [name], MD Taking Active   furosemide (LASIX) 40 MG tablet [id number] Yes Take 1 tablet (40 mg total) by mouth daily as needed. [name], MD Taking Active   gabapentin (NEURONTIN) 100 MG capsule [id number] Yes Take 1 capsule (100 mg total) by mouth 2 (two) times daily. [name], MD Taking Active            Med Note [name], [name] May 20, 2020  2:27 PM) Taking QPM  glucosamine-chondroitin 500-400 MG tablet [id number]  Take 1 tablet by mouth 3 (three) times daily. [provider]  Active   glucose blood (ACCU-CHEK AVIVA PLUS) test strip [id number]  Use to check blood sugar up to 4 times daily [name], MD  Active   HYDROcodone-acetaminophen (NORCO/VICODIN) 5-325 MG tablet [id number]  Norco 5 mg-325 mg tablet  Take 1 tablet twice a day by oral route as needed. [provider]  Active            Med Note [name] Dec 18, 2019  3:23 PM) Never takes more than 1 per day; takes ~3 times per week  Insulin Glargine [facility] KWIKPEN) 100 UNIT/ML [id number] Yes INJECT 20 UNITS TOTAL      SUBCUTANEOUSLY DAILY [name], MD Taking Active            Med Note [name]   Wed Apr 29, 2020  1:13 PM) 10 units  Lancets MISC [id number] Yes Use up to 4 times daily to check blood sugars. Coral Spikes, DO Taking Active   losartan (COZAAR) 50 MG tablet [id number] Yes TAKE 1 TABLET DAILY [name], MD Taking Active   Melatonin 5 MG CHEW [id number] Yes Chew 5 mg by mouth daily. [provider] Taking Active   potassium chloride (K-DUR) 10 MEQ tablet [id number] No potassium chloride ER 10 mEq tablet,extended release  Patient not taking: Reported on 05/20/2020   [provider] Not Taking Active            Med Note Thurmond Butts, CAROLINE E   Mon Nov 06, 2017 10:32 AM) Only takes when she takes furosemide          Patient  Active Problem List   Diagnosis Date Noted  . Dysuria 03/02/2020  . Edema of lower extremity 11/19/2019  . Hyperlipidemia 11/15/2019  . Low back pain 11/15/2019  . Dry eyes 10/01/2019  . Tick bite 10/01/2019  . Vomiting 10/01/2019  . Personal history of other specified conditions 07/03/2019  . Fall 04/15/2019  . Type 2 diabetes mellitus ([place]) 03/18/2019  . Hypertension 03/18/2019  . Hyperparathyroidism due to renal insufficiency ([place]) 03/18/2019  . Headache disorder 09/10/2018  . Cervico-occipital neuralgia 07/23/2018  . Osteoporosis 07/23/2018  . Cramp and spasm 02/12/2018  . Paresthesia of hand 10/25/2017  . Lesion of rectum 09/07/2017  . Loss of height 08/15/2017  . 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  Cervical spondylosis without myelopathy 08/15/2017  . DDD (degenerative disc disease), cervical 08/15/2017  . Bursitis of hip 08/15/2017  . Female stress incontinence 08/15/2017  . Muscle weakness 08/15/2017  . Neck pain 08/15/2017  . Spondylolisthesis, congenital 08/15/2017  . Radial styloid tenosynovitis 08/15/2017  . Senile osteoporosis 08/15/2017  . Hip pain 08/15/2017  . 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  . Rectal hemorrhage 07/25/2017  . Essential tremor 04/26/2017  . Other fatigue 03/22/2017  . Left ventricular hypertrophy 10/12/2016  . Chronic kidney disease, stage 4 (severe) ([place]) 08/26/2016  . Obstructive sleep apnea syndrome 08/26/2016    Conditions to be addressed/monitored: HTN, HLD, DMII and Depression  Care Plan : Medication Management  Updates made by [name], RPH-CPP since 05/20/2020 12:00 AM    Problem: Diabetes, CKD, Mental Health     Long-Range Goal: Disease Progression Prevention   Start Date: 03/02/2020  Expected End Date: 05/15/2020  Recent Progress: On track  Priority: High  Note:   Current Barriers:  . Unable to achieve control of diabetes  . Unable to independently control mental health  diagnoses  Pharmacist Clinical Goal(s):  Marland Kitchen Over the next 90 days, patient will achieve adherence to monitoring guidelines and medication adherence to achieve therapeutic efficacy. . Over the next 90 days, patient will a achieve control of diabetes as evidenced by A1c through collaboration with PharmD and provider.   Interventions: . 1:1 collaboration with [name], MD regarding development and update of comprehensive plan of care as evidenced by provider attestation and co-signature . Inter-disciplinary care team collaboration (see longitudinal plan of care) . Comprehensive medication review performed; medication list updated in electronic medical record  Diabetes: . Uncontrolled; current treatment: Basaglar 10 units daily o Hx nausea, vomiting with Ozempic. Tolerated Victoza previously.  . Current glucose readings . Denies episodes of hypoglycemia . Current meal patterns: appetite has recovered, admits she is eating things she shouldn't. Went to [facility] yesterday for breakfast and had pancakes, bacon, sausage, and apple juice. Also notes the night before where she got a spaghetti plate from [employer]. Notes she ate a candy bar on the way to office today . Discussed that diet is playing a significant impact on glucose control. Encouraged patient to re-start keeping a food diary, as this previously helped her better moderate meals/portion sizes . Discussed eventually restarting Victoza. Patient was interested in doing this today, and she has a supply at home. Discussed that if she develops recurrent nausea/vomiting to immediately stop Victoza and contact our office. Patient verbalized understanding. Restart Victoza at 0.6 mg daily. Reduce Basaglar to 8 units daily. Discussed taking both medications every morning when she wakes up (late morning) and the importance of consistency in administration time.  . Encouraged to notify us if she receives steroid injection at upcoming ortho  visit  Hypertension: . Controlled per last clinic BP; current treatment: amlodipine 5 mg daily, losartan 50 mg daily . Patient is not checking at home, but has a machine.  . Furosemide 40 mg + potassium 10 mEq PRN lower extremity edema- though notes that she takes when she feels "increased eye bags"; has not been using regularly  . Recommended continue current regimen . Encouraged weekly home BP monitoring. Patient verbalized understanding.   Hyperlipidemia, ASCVD risk reduction . Controlled per last lipid panel; current treatment: atorvastatin 80 mg daily, ezetimibe 10 mg daily;  . Antiplatelet regimen: aspirin 81 mg daily . Recommended continue current regimen  Depression, anxiety: . Patient indicates  improvement in mood with start of duloxetine 2 weeks ago. Notes that her emotions feel less volatile, that she feels "more like myself"; current regimen: duloxetine 30 mg daily . Continue current regimen at this time. Encouraged continued collaboration and communication with primary care team regarding mood . Provided education that this medication may also provide benefit for chronic pain  Osteoporosis: . Appropriately treated w/ Prolia Q6 months; last dose 03/2020; will be due again in May . Supplements: calcium 1200 mg + Vitamin D 1000 units daily; calcitriol 0.25 mcg daily per nephrology  . Recommended to continue current regimen at this time  Chronic Pain - Degenerative Disc Disease, Arthritis: . Well controlled at this time; current regimen: gabapentin 100 mg BID, but notes that she is only taking 1 dose QPM. Denies any sedation or next day drowsiness from this medication; PRN hydrocodone/APAP 5/325 mg PRN, cyclobenzaprine 10 mg, docusate 100 mg PRN; . Recommended to continue current regimen at this time. Discussed increased sedation with aging, or decreased renal function with gabapentin due to reduced clearance. Patient verbalizes understanding  Patient Goals/Self-Care  Activities . Over the next 90 days, patient will:  - take medications as prescribed - check glucose at least QID using continuous glucose monitor, document, and provide at future appointments - target a minimum of 150 minutes of moderate intensity exercise weekly - start keeping a food diary to better monitor impact of dietary choices  Follow Up Plan: Face to Face appointment with care management team member scheduled for:  ~ 8 weeks (PCP visit in ~ 4 weeks)     Medication Assistance: None required. Patient affirms current coverage meets needs.   Follow Up:  Patient agrees to Care Plan and Follow-up.  Plan: Face to Face appointment with care management team member scheduled for: ~ 8 weeks (PCP visit in ~ 4 weeks)  [name], PharmD, [place], [place] Clinical Pharmacist [employer] at [facility] [phone]

## 2020-05-20 NOTE — Patient Instructions (Addendum)
It was great talking to you today!  Let's restart the Victoza. Inject 0.6 mg every morning.  Decrease Basaglar to 8 units every morning. Inject the Basaglar and the Victoza around the same time every day when you first get up.   Keep doing a great job scanning your blood sugars throughout the day.   I recommend you go back to keeping a food diary. If you find that certain meals result in blood sugars being greater than 200 after the meal, go back and think about what you ate and how much you ate. Use that to help adjust your meals (for example, don't get juice/tea next time, eat a smaller portion size of spaghetti or pancakes, etc).   Try to check your blood pressure about once weekly. Make sure you are sitting still with your feel flat on the floor for at least 5 minutes before checking.   Take care, and call me if you need anything!  [name], PharmD [phone]  Visit Information  Patient Care Plan: Medication Management    Problem Identified: Diabetes, CKD, Mental Health     Long-Range Goal: Disease Progression Prevention   Start Date: 03/02/2020  Expected End Date: 05/15/2020  This Visit's Progress: On track  Recent Progress: On track  Priority: High  Note:   Current Barriers:  . Unable to achieve control of diabetes  . Unable to independently control mental health diagnoses  Pharmacist Clinical Goal(s):  Marland Kitchen Over the next 90 days, patient will achieve adherence to monitoring guidelines and medication adherence to achieve therapeutic efficacy. . Over the next 90 days, patient will a achieve control of diabetes as evidenced by A1c through collaboration with PharmD and provider.   Interventions: . 1:1 collaboration with [name], MD regarding development and update of comprehensive plan of care as evidenced by provider attestation and co-signature . Inter-disciplinary care team collaboration (see longitudinal plan of care) . Comprehensive medication review  performed; medication list updated in electronic medical record  Diabetes: . Uncontrolled; current treatment: Basaglar 10 units daily o Hx nausea, vomiting with Ozempic. Tolerated Victoza previously.  . Current glucose readings Date of Download: 05/07/20-05/20/20 % Time CGM is active: 96% Average Glucose: 187 mg/dL Glucose Management Indicator: 7.8  Glucose Variability: 42.2 (goal <36%) Time in Goal:  - Time in range 70-180: 56% - Time above range: 43% - Time below range: 1% Observed patterns: controlled fastings, but significant post prandial elevations . Denies episodes of hypoglycemia . Current meal patterns: appetite has recovered, admits she is eating things she shouldn't. Went to [employer] yesterday for breakfast and had pancakes, bacon, sausage, and apple juice. Also notes the night before where she got a spaghetti plate from [employer]. Notes she ate a candy bar on the way to office today . Discussed that diet is playing a significant impact on glucose control. Encouraged patient to re-start keeping a food diary, as this previously helped her better moderate meals/portion sizes . Discussed eventually restarting Victoza. Patient was interested in doing this today, and she has a supply at home. Discussed that if she develops recurrent nausea/vomiting to immediately stop Victoza and contact our office. Patient verbalized understanding. Restart Victoza at 0.6 mg daily. Reduce Basaglar to 8 units daily. Discussed taking both medications every morning when she wakes up (late morning) and the importance of consistency in administration time.  . Encouraged to notify us if she receives steroid injection at upcoming ortho visit  Hypertension: . Controlled per last clinic BP; current  treatment: amlodipine 5 mg daily, losartan 50 mg daily . Patient is not checking at home, but has a machine.  . Furosemide 40 mg + potassium 10 mEq PRN lower extremity edema- though notes that she takes when she  feels "increased eye bags"; has not been using regularly  . Recommended continue current regimen . Encouraged weekly home BP monitoring. Patient verbalized understanding.   Hyperlipidemia, ASCVD risk reduction . Controlled per last lipid panel; current treatment: atorvastatin 80 mg daily, ezetimibe 10 mg daily;  . Antiplatelet regimen: aspirin 81 mg daily . Recommended continue current regimen  Depression, anxiety: . Patient indicates improvement in mood with start of duloxetine 2 weeks ago. Notes that her emotions feel less volatile, that she feels "more like myself"; current regimen: duloxetine 30 mg daily . Continue current regimen at this time. Encouraged continued collaboration and communication with primary care team regarding mood . Provided education that this medication may also provide benefit for chronic pain  Osteoporosis: . Appropriately treated w/ Prolia Q6 months; last dose 03/2020; will be due again in May . Supplements: calcium 1200 mg + Vitamin D 1000 units daily; calcitriol 0.25 mcg daily per nephrology  . Recommended to continue current regimen at this time  Chronic Pain - Degenerative Disc Disease, Arthritis: . Well controlled at this time; current regimen: gabapentin 100 mg BID, but notes that she is only taking 1 dose QPM. Denies any sedation or next day drowsiness from this medication; PRN hydrocodone/APAP 5/325 mg PRN, cyclobenzaprine 10 mg, docusate 100 mg PRN; . Recommended to continue current regimen at this time. Discussed increased sedation with aging, or decreased renal function with gabapentin due to reduced clearance. Patient verbalizes understanding  Patient Goals/Self-Care Activities . Over the next 90 days, patient will:  - take medications as prescribed - check glucose at least QID using continuous glucose monitor, document, and provide at future appointments - target a minimum of 150 minutes of moderate intensity exercise weekly - start keeping a food  diary to better monitor impact of dietary choices  Follow Up Plan: Face to Face appointment with care management team member scheduled for:  ~ 8 weeks (PCP visit in ~ 4 weeks)   Patient Care Plan: Depression (Adult)    Problem Identified: Depression Identification (Depression)   Priority: High  Onset Date: 03/30/2020    Goal: Depressive Symptoms Identified   Start Date: 03/30/2020  Expected End Date: 05/08/2020  This Visit's Progress: On track  Recent Progress: On track  Priority: Medium  Note:   Evidence-based guidance:  Identify risk for depression by reviewing presenting symptoms and risk factors.  Review use of medications that contribute to depression such as steroid, narcotic, sedative, antihypertensive, beta blocker, cytoxic agent.  Review related metabolic processes, including infection, anemia, thyroid dysfunction, kidney failure, heart failure, alcohol or substance use.  Perform depression screening using standardized tools to obtain baseline intensity of depressive symptoms.  Perform or refer for a full diagnostic interview when positive screening results are noted; use DSM-5 criteria to determine appropriate diagnosis (e.g., major depression, persistent depressive disorder, unspecified depressive   disorder).   Notes:    Task: Identify Depressive Symptoms and Facilitate Treatment   Due Date: 04/14/2020  Outcome: Positive  Responsible User: Barbaraann Faster, RN  Note:   Care Management Activities:    - depression screen reviewed - medication list reviewed - mental health treatment arranged - participation in psychiatric services encouraged - substance use assessed    Notes:    Problem  Identified: Symptoms (Depression)   Priority: High  Onset Date: 03/30/2020    Goal: Symptoms Monitored and Managed   Start Date: 03/02/2020  Expected End Date: 05/15/2020  This Visit's Progress: On track  Note:   Evidence-based guidance:   Promote use of complementary and  alternative medicine therapy including exercise, relaxation, music, bright light or dance therapy, acupuncture, aromatherapy.   Monitor and promote nutrition, pain control and sleep/rest; provide guidance regarding sleep hygiene techniques when patient presents with insomnia.   Explore treatment options in an atmosphere of hope and optimism; support the belief that recovery is possible.   Facilitate and advocate for mental health treatment that may include depression-focused psychotherapy, cognitive-behavioral therapy, social-skills training, relaxation training, problem-solving therapy.   Prepare for use of pharmacotherapy, such as selective serotonin-reuptake inhibitor, tricyclic antidepressant, serotonin norepinephrine-reuptake inhibitor, amino ketone, monoamine-oxidase inhibitor based on presentation and risk factors.   Caution patients who choose over-the-counter S-adenosyl methionine (SAM-e) or St. John's Wort regarding potential drug interactions; engage pharmacist in consultation.   Monitor and manage response to medication and therapy regularly; consider weekly for the first month, then monthly after 4 to 8 weeks of treatment until full remission or for 6 to 24 months; anticipate adjustments to plan.   Prepare patient for potential long-term pharmacologic treatment that may prevent a relapse.   Facilitate shared decision-making regarding treatment, particularly for major depression, that may include electroconvulsive therapy or transcranial magnetic stimulation.   Promote development of physical activity plan to increase the secretion of neurobiological markers, promote social interaction, distraction and confidence-building.   Explore means to support work reintegration, such as modified working hours, peer support or coaching and community job programs.   Consider referral to community-based health program for support and group activities, such as exercise classes.   Notes:     Task: Alleviate Barriers to Depression Treatment   Due Date: 05/08/2020  Outcome: Positive  Note:   Care Management Activities:    - activity or exercise based on tolerance encouraged - depression screen reviewed - emotional support provided - healthy lifestyle promoted - medication side effects monitored and managed - participation in mental health treatment encouraged - response to pharmacologic therapy monitored - substance use assessed    Notes:    Problem Identified: Harm or Injury (Depression)   Priority: Low  Onset Date: 03/30/2020    Goal: Harm or Injury Prevented Completed 04/14/2020  Start Date: 03/30/2020  Expected End Date: 05/08/2020  This Visit's Progress: On track  Recent Progress: On track  Priority: Low  Note:   Evidence-based guidance:  Explore self-harm or suicidal tendencies compassionately, yet directly, by asking about suicidal ideation, attempt history, family history and history of self-harming behaviors.  Make immediate arrangements for evaluation at a local emergency department, community mental health agency or other psychiatric service when patient expresses positive suicidal ideation with a plan and access to lethal means.  Provide anticipatory guidance to remove lethal medication and firearms from home.  Ensure adequate supervision is provided to monitor for increasing risk factors; provide emergency contact information and instructions.  Assess for injurious side effects of pharmacologic therapy, such as drug interactions, sedation and insomnia, as well as cardiovascular, anticholinergic or sexual effects.  Engage family in providing a safe home environment and closely monitoring changes in the patient's emotional state that may include negativity, hopelessness and suicidal ideation.  Maintain frequent, structured and supportive contact by phone, office or home visit; collaborate closely with behavioral health specialists and psychiatry.  Notes:     Task: Identify and Reduce Risk to Safety Completed 04/14/2020  Due Date: 05/08/2020  Outcome: Positive  Note:   Care Management Activities:    - risk of self-injury assessed - side effects of medication monitored - suicidal or self-injurious tendencies explored    Notes:    Problem Identified: Response to Treatment (Depression)   Priority: High  Onset Date: 03/30/2020    Long-Range Goal: Response to Treatment Maximized   Start Date: 03/30/2020  Expected End Date: 06/08/2020  This Visit's Progress: On track  Recent Progress: On track  Priority: High  Note:   Evidence-based guidance:  Engage patient in conversation about the perceived benefits of mental health treatment and quality of therapeutic alliance with his/her mental health professional.  Assess for barriers to attending appointments, such as transportation, financial, sense of slow or little improvement and forgetfulness.  Consider patient resistance to treatment based on stigma related to mental health diagnosis.  Maintain a documented system of ongoing contacts with patient during the first 6 to 12 months of treatment, as missed appointments and disengagement may signal deteriorating condition.  Provide anticipatory guidance about the risk of increased symptoms and potential psychiatric hospitalization for those who have a pattern of nonattendance at mental health appointments.  Re-screen for depressive symptoms at mutually identified intervals.   Notes:    Task: Facilitate Engagement in [place]   Due Date: 05/08/2020  Outcome: Positive  Responsible User: Barbaraann Faster, RN  Note:   Care Management Activities:    - re-screen for depressive symptoms performed - perceived benefits to therapy discussed    Notes:      Print copy of patient instructions, educational materials, and care plan provided in person.   Plan: Face to Face appointment with care management team member scheduled for: ~ 8  weeks (PCP visit in ~ 4 weeks)  [name], PharmD, [place], [place] Clinical Pharmacist [employer] at [facility] [phone]

## 2020-05-22 ENCOUNTER — Other Ambulatory Visit: Payer: Self-pay

## 2020-05-22 ENCOUNTER — Encounter: Payer: Self-pay | Admitting: *Deleted

## 2020-05-22 ENCOUNTER — Other Ambulatory Visit: Payer: Self-pay | Admitting: *Deleted

## 2020-05-22 NOTE — Patient Outreach (Incomplete)
[place] [facility]) Care Management  05/22/2020  Kelly [name] 1940-11-02 [id number]  [facility] successful outreach for MD referral with case transfer to embedded services  Referral Date:02/19/20 Referral Source:MD referral, [name] [facility] embedded pharmacy staff Referral Reason:Disease management education and mental health support Catie-I've worked with this patient for ~2 years now on DM, making significant improvements. 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 Tearful during visit today about what she can do to prevent decline. Not much support/anybody to talk to at home besides her daughter, who is causing more worry. Would appreciate diet/disease education, support, and definitely mental health support.  Insurance:blue cross and blue shield  Patient is able to verify HIPAA ([place] and Accountability Act) identifiers Reviewed and addressed the purpose of the follow up call with the patient  Consent: [facility] ([place]) RN CM reviewed [facility] services with patient. Patient gave verbal consent for services.  Follow up assessment Kelly [name] states she is doing much better  She confirms she is now on cymbalta with the encouragement of her provider  She reports it is "working", "I am not so emotional" She states she is thankful she was willing to try as her daughter was not wanting her to be on medications. She made a quality decision for herself and is thankful  [facility] RN CM assessed for care coordination needs and disease management She states she does not feel she has any at this time She continues to work on managing her symptoms, blood sugar, and diet She is following her treatment plan and is become more active She apologizes for not being available on 05/15/20 as she was attending a funeral [facility] RN CM provided empathy, sympathy and encouragement [facility] RN CM reviewed with her the [place]  embedded CM services for 2022 Kelly [name] is agreeing to engage with [facility] embedded CM services for 2022  Plans completed warm transfer to embedded CM services Patient agrees to care plan and future engagement with embedded CM services at her pcp office [facility] telephonic CM services closed -  Pt encouraged to return a call to [place] CM prn Letters sent to patient and MD  Goals Addressed              This Visit's Progress     Patient Stated   .  COMPLETED: [facility]) Become More Active (pt-stated)   On track     Follow Up Date 05/22/20    - choose a type of activity I enjoy such as biking, gardening, team sports, walking - keep track of how long I exercise     Notes: 05/22/20 goal  met    .  [facility]) Eat Healthy (pt-stated)   On track     Follow Up Date Pending outreach from embedded RN CM   - drink 6 to 8 glasses of water each day - set a realistic goal      Notes: 05/22/20 goal being met    .  [facility]) Follow My Treatment Plan (pt-stated)   On track     Follow Up Date Pending outreach from embedded RN CM   - ask for help if I can't afford my medicines - avoid negative self-talk - call the doctor or nurse to get help with side effects - keep follow-up appointments    Notes: 05/22/20 goal being met    .  [facility]) Monitor and Manage My Blood Sugar (pt-stated)   On track  Follow Up Date Pending outreach from embedded RN CM   - check blood sugar at prescribed times - check blood sugar if I feel it is too high or too low     Notes: 05/22/20 goal being met    .  [facility]) Track and Manage My Symptoms (pt-stated)   On track     Follow Up Date Pending outreach from embedded RN CM - develop a personal safety plan - watch for early signs of feeling worse     Notes: 05/22/20 goal being met 04/14/20 depression symptoms better since been on Zoloft 50 mg for 2 weeks  03/30/20 outreach to pcp RN to share Kelly [name]'s symptoms and concerns with continuation of depression since change in  her treatment plan. [facility] RN CM inquired about possible referrals to counselor or psychiatry  Kelly [name] will speak with Dr Doree Albee L. Lavina Hamman, RN, BSN, [place] Coordinator Office number [phone] Main [facility] number [phone] Fax number [phone]

## 2020-05-22 NOTE — Patient Outreach (Signed)
[place] [name]) Care Management  05/22/2020  Kelly [name] 07/17/1940 [id number]  [facility] successful outreach for MD referral with case transfer to embedded services  Referral Date:02/19/20 Referral Source:MD referral, Kelly [name] embedded pharmacy staff Referral Reason:Disease management education and mental health support Kelly-I've worked with this patient for ~2 years now on DM, making significant improvements. 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 Tearful during visit today about what she can do to prevent decline. Not much support/anybody to talk to at home besides her daughter, who is causing more worry. Would appreciate diet/disease education, support, and definitely mental health support.  Insurance:blue cross and blue shield  Patient is able to verify HIPAA ([place] and Accountability Act) identifiers Reviewed and addressed the purpose of the follow up call with the patient  Consent: [facility] ([place]) RN CM reviewed [facility] services with patient. Patient gave verbal consent for services.  Follow up assessment Kelly [name] states she is doing much better  She confirms she is now on cymbalta with the encouragement of her provider  She reports it is "working", "I am not so emotional" She states she is thankful she was willing to try as her daughter was not wanting her to be on medications. She made a quality decision for herself and is thankful  [facility] RN CM assessed for care coordination needs and disease management She states she does not feel she has any at this time She continues to work on managing her symptoms, blood sugar, and diet She is following her treatment plan and is become more active She apologizes for not being available on 05/15/20 as she was attending a funeral [facility] RN CM provided empathy, sympathy and encouragement [name] [place] RN CM reviewed with her the [place]  embedded CM services for 2022 Kelly [name] is agreeing to engage with Kelly [name] [facility] embedded CM services for 2022  Plans completed warm transfer to embedded CM services Patient agrees to care plan and future engagement with embedded CM services at her pcp office [facility] telephonic CM services closed -  Pt encouraged to return a call to [place] CM prn Letters sent to patient and MD  Goals Addressed              This Visit's Progress     Patient Stated   .  COMPLETED: [name]) Become More Active (pt-stated)   On track     Follow Up Date 05/22/20    - choose a type of activity I enjoy such as biking, gardening, team sports, walking - keep track of how long I exercise     Notes: 05/22/20 goal  met    .  [facility]) Eat Healthy (pt-stated)   On track     Follow Up Date Pending outreach from embedded RN CM   - drink 6 to 8 glasses of water each day - set a realistic goal      Notes: 05/22/20 goal being met    .  [facility]) Follow My Treatment Plan (pt-stated)   On track     Follow Up Date Pending outreach from embedded RN CM   - ask for help if I can't afford my medicines - avoid negative self-talk - call the doctor or nurse to get help with side effects - keep follow-up appointments    Notes: 05/22/20 goal being met    .  [facility]) Monitor and Manage My Blood Sugar (pt-stated)   On track  Follow Up Date Pending outreach from embedded RN CM   - check blood sugar at prescribed times - check blood sugar if I feel it is too high or too low     Notes: 05/22/20 goal being met    .  [facility]) Track and Manage My Symptoms (pt-stated)   On track     Follow Up Date Pending outreach from embedded RN CM - develop a personal safety plan - watch for early signs of feeling worse     Notes: 05/22/20 goal being met 04/14/20 depression symptoms better since been on Zoloft 50 mg for 2 weeks  03/30/20 outreach to pcp RN to share Kelly [name]'s symptoms and concerns with continuation of depression since change in  her treatment plan. [facility] RN CM inquired about possible referrals to counselor or psychiatry  Kelly [name] will speak with Dr Doree Albee L. Lavina Hamman, RN, BSN, [place] Coordinator Office number [phone] Main [facility] number [phone] Fax number [phone]

## 2020-05-25 DIAGNOSIS — M7062 Trochanteric bursitis, left hip: Secondary | ICD-10-CM | POA: Diagnosis not present

## 2020-05-25 DIAGNOSIS — Q762 Congenital spondylolisthesis: Secondary | ICD-10-CM | POA: Diagnosis not present

## 2020-05-25 DIAGNOSIS — M5032 Other cervical disc degeneration, mid-cervical region, unspecified level: Secondary | ICD-10-CM | POA: Diagnosis not present

## 2020-05-25 DIAGNOSIS — E1165 Type 2 diabetes mellitus with hyperglycemia: Secondary | ICD-10-CM | POA: Diagnosis not present

## 2020-05-25 DIAGNOSIS — T2101XA Burn of unspecified degree of chest wall, initial encounter: Secondary | ICD-10-CM | POA: Diagnosis not present

## 2020-05-25 DIAGNOSIS — M25559 Pain in unspecified hip: Secondary | ICD-10-CM | POA: Diagnosis not present

## 2020-05-25 DIAGNOSIS — M6281 Muscle weakness (generalized): Secondary | ICD-10-CM | POA: Diagnosis not present

## 2020-05-25 DIAGNOSIS — M25572 Pain in left ankle and joints of left foot: Secondary | ICD-10-CM | POA: Diagnosis not present

## 2020-05-25 DIAGNOSIS — M545 Low back pain, unspecified: Secondary | ICD-10-CM | POA: Diagnosis not present

## 2020-05-25 DIAGNOSIS — M25519 Pain in unspecified shoulder: Secondary | ICD-10-CM | POA: Diagnosis not present

## 2020-05-25 DIAGNOSIS — M542 Cervicalgia: Secondary | ICD-10-CM | POA: Diagnosis not present

## 2020-05-25 DIAGNOSIS — M5136 Other intervertebral disc degeneration, lumbar region: Secondary | ICD-10-CM | POA: Diagnosis not present

## 2020-05-27 ENCOUNTER — Ambulatory Visit: Payer: Medicare Other | Admitting: Family Medicine

## 2020-06-01 ENCOUNTER — Ambulatory Visit (INDEPENDENT_AMBULATORY_CARE_PROVIDER_SITE_OTHER): Payer: Medicare Other | Admitting: Internal Medicine

## 2020-06-01 VITALS — BP 113/61 | HR 81 | Temp 98.6°F | Resp 14 | Ht 64.0 in | Wt 125.0 lb

## 2020-06-01 DIAGNOSIS — N184 Chronic kidney disease, stage 4 (severe): Secondary | ICD-10-CM | POA: Diagnosis not present

## 2020-06-01 DIAGNOSIS — Z9989 Dependence on other enabling machines and devices: Secondary | ICD-10-CM | POA: Diagnosis not present

## 2020-06-01 DIAGNOSIS — Z7189 Other specified counseling: Secondary | ICD-10-CM | POA: Insufficient documentation

## 2020-06-01 DIAGNOSIS — R600 Localized edema: Secondary | ICD-10-CM | POA: Diagnosis not present

## 2020-06-01 DIAGNOSIS — I10 Essential (primary) hypertension: Secondary | ICD-10-CM | POA: Diagnosis not present

## 2020-06-01 DIAGNOSIS — N2581 Secondary hyperparathyroidism of renal origin: Secondary | ICD-10-CM | POA: Diagnosis not present

## 2020-06-01 DIAGNOSIS — G4733 Obstructive sleep apnea (adult) (pediatric): Secondary | ICD-10-CM | POA: Diagnosis not present

## 2020-06-01 DIAGNOSIS — E1122 Type 2 diabetes mellitus with diabetic chronic kidney disease: Secondary | ICD-10-CM | POA: Diagnosis not present

## 2020-06-01 DIAGNOSIS — R8290 Unspecified abnormal findings in urine: Secondary | ICD-10-CM | POA: Diagnosis not present

## 2020-06-01 NOTE — Patient Instructions (Signed)

## 2020-06-01 NOTE — Progress Notes (Unsigned)
[facility] [place], [place] [address]  Pulmonary Sleep Medicine   Office Visit Note  Patient Name: Kelly [name] DOB: [birth year] MRN [id number]    Chief Complaint: Obstructive Sleep Apnea visit  Brief History:  Kelly [name] is seen today for follow up The patient has a 12 year history of sleep apnea. Patient is not using PAP nightly.  She is still removing the mask unknowingly at night. She is unaware of any problems with the mask as to why she is removing the mask.  She is sleeping elevated on her side with the CPAP pillow.  She is using the nasal mask. The patient feels more rested after sleeping with PAP.  The patient reports benefiting from PAP use. Reported sleepiness is  Improved  and the Epworth2 Sleepiness Score is 2 out of 24. The patient occasionally take naps. The patient complains of the folowing: dry mouth  The compliance download shows poot compliance with an average use time of 5.2 hours. The AHI is 0.6  The patient no longer complains of limb movements disrupting sleep.  ROS  General: (-) fever, (-) chills, (-) night sweat Nose and Sinuses: (-) nasal stuffiness or itchiness, (-) postnasal drip, (-) nosebleeds, (-) sinus trouble. Mouth and Throat: (-) sore throat, (-) hoarseness. Neck: (-) swollen glands, (-) enlarged thyroid, (-) neck pain. Respiratory: - cough, - shortness of breath, - wheezing. Neurologic: - numbness, - tingling. Psychiatric: anxiety/depression doing well with cymbalta.    Current Medication: Outpatient Encounter Medications as of 06/01/2020  Medication Sig Note  . amLODipine (NORVASC) 5 MG tablet Take 1 tablet (5 mg total) by mouth daily.   Marland Kitchen aspirin EC 81 MG tablet Take 81 mg by mouth daily.   Marland Kitchen atorvastatin (LIPITOR) 80 MG tablet TAKE 1 TABLET DAILY   . calcitRIOL (ROCALTROL) 0.25 MCG capsule Take 0.25 mcg by mouth daily.   . Calcium Citrate-Vitamin D (CALCIUM CITRATE +D PO) Take 600 mg by mouth daily.    .  Cholecalciferol (VITAMIN D) 50 MCG (2000 UT) CAPS Take 2,000 Units by mouth daily.    . Continuous Blood Gluc Sensor (FREESTYLE LIBRE 2 SENSOR) MISC Use to check sugar at least 4 times daily   . cyclobenzaprine (FLEXERIL) 10 MG tablet TAKE 1/2 TABLET (5MG  TOTAL)3 TIMES A DAY AS NEEDED    FOR MUSCLE SPASMS   . denosumab (PROLIA) 60 MG/ML SOSY injection Inject 60 mg into the skin every 6 (six) months.   . DULoxetine (CYMBALTA) 30 MG capsule Take 1 capsule (30 mg total) by mouth daily.   . furosemide (LASIX) 40 MG tablet Take 1 tablet (40 mg total) by mouth daily as needed.   . gabapentin (NEURONTIN) 100 MG capsule Take 1 capsule (100 mg total) by mouth 2 (two) times daily. 05/20/2020: Taking QPM  . glucosamine-chondroitin 500-400 MG tablet Take 1 tablet by mouth 3 (three) times daily.   Marland Kitchen glucose blood (ACCU-CHEK AVIVA PLUS) test strip Use to check blood sugar up to 4 times daily   . HYDROcodone-acetaminophen (NORCO/VICODIN) 5-325 MG tablet Norco 5 mg-325 mg tablet  Take 1 tablet twice a day by oral route as needed. 12/18/2019: Never takes more than 1 per day; takes ~3 times per week  . Insulin Glargine (BASAGLAR KWIKPEN) 100 UNIT/ML Inject 8 Units into the skin daily.   . Lancets MISC Use up to 4 times daily to check blood sugars.   . liraglutide (VICTOZA) 18 MG/3ML SOPN Inject 0.6 mg into the skin daily.   Marland Kitchen  losartan (COZAAR) 50 MG tablet TAKE 1 TABLET DAILY   . Melatonin 5 MG CHEW Chew 5 mg by mouth daily.   . potassium chloride (K-DUR) 10 MEQ tablet potassium chloride ER 10 mEq tablet,extended release (Patient not taking: Reported on 05/20/2020) 11/06/2017: Only takes when she takes furosemide   No facility-administered encounter medications on file as of 06/01/2020.    Surgical History: Past Surgical History:  Procedure Laterality Date  . APPENDECTOMY    . BREAST BIOPSY Right 06/19/2018   affirm stereo/x clip/COLUMNAR CELL CHANGE WITH MICROCALCIFICATIONS. FIBROCYSTIC CHANGES WITH  MICROCALCIFICATIONS  . cataract  03/2017  . CHOLECYSTECTOMY    . COLONOSCOPY    . COLONOSCOPY WITH PROPOFOL N/A 08/22/2017   Procedure: COLONOSCOPY WITH PROPOFOL;  Surgeon: Jonathon Bellows, MD;  Location: [facility] ENDOSCOPY;  Service: Gastroenterology;  Laterality: N/A;  . ESOPHAGOGASTRODUODENOSCOPY    . ESOPHAGOGASTRODUODENOSCOPY (EGD) WITH PROPOFOL N/A 03/23/2016   Procedure: ESOPHAGOGASTRODUODENOSCOPY (EGD) WITH PROPOFOL;  Surgeon: Manya Silvas, MD;  Location[facility] ENDOSCOPY;  Service: Endoscopy;  Laterality: N/A;  . FLEXIBLE SIGMOIDOSCOPY N/A 02/06/2018   Procedure: FLEXIBLE SIGMOIDOSCOPY;  Surgeon: Jonathon Bellows, MD;  Location: [facility] ENDOSCOPY;  Service: Gastroenterology;  Laterality: N/A;  . FLEXIBLE SIGMOIDOSCOPY N/A 12/03/2018   Procedure: FLEXIBLE SIGMOIDOSCOPY;  Surgeon: Jonathon Bellows, MD;  Location: [facility] ENDOSCOPY;  Service: Gastroenterology;  Laterality: N/A;  . RECTAL PROLAPSE REPAIR  2012   x2    Medical History: Past Medical History:  Diagnosis Date  . Colon polyps   . Depression   . DM (diabetes mellitus), type 2 with renal complications ([place])   . Hyperlipidemia   . Hypertension   . Kidney stones   . Shingles 12/05/2019  . Sleep apnea   . 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     Family History: Non contributory to the present illness  Social History: Social History   Socioeconomic History  . Marital status: Widowed    Spouse name: Not on file  . Number of children: Not on file  . Years of education: Not on file  . Highest education level: High school graduate  Occupational History  . Occupation: retired   Tobacco Use  . Smoking status: Never Smoker  . Smokeless tobacco: Never Used  Vaping Use  . Vaping Use: Never used  Substance and Sexual Activity  . Alcohol use: No  . Drug use: No  . Sexual activity: Not Currently    Partners: Male  Other Topics Concern  . Not on file  Social History Narrative  . Not on file   Social  Determinants of Health   Financial Resource Strain: Low Risk   . Difficulty of Paying Living Expenses: Not hard at all  Food Insecurity: No Food Insecurity  . Worried About Charity fundraiser in the Last Year: Never true  . Ran Out of Food in the Last Year: Never true  Transportation Needs: No Transportation Needs  . Lack of Transportation (Medical): No  . Lack of Transportation (Non-Medical): No  Physical Activity: Not on file  Stress: Stress Concern Present  . Feeling of Stress : To some extent  Social Connections: Not on file  Intimate Partner Violence: Not At Risk  . Fear of Current or Ex-Partner: No  . Emotionally Abused: No  . Physically Abused: No  . Sexually Abused: No    Vital Signs: Blood pressure 113/61, pulse 81, temperature 98.6 F (37 C), temperature source Temporal, resp. rate 14, height 5\' 4"  (1.626  m), weight 125 lb (56.7 kg), SpO2 94 %.  Examination: General Appearance: The patient is well-developed, well-nourished, and in no distress. Neck Circumference: 33 Skin: Gross inspection of skin unremarkable. Head: normocephalic, no gross deformities. Eyes: no gross deformities noted. ENT: ears appear grossly normal Neurologic: Alert and oriented. No involuntary movements.    EPWORTH SLEEPINESS SCALE:  Scale:  (0)= no chance of dozing; (1)= slight chance of dozing; (2)= moderate chance of dozing; (3)= high chance of dozing  Chance  Situtation    Sitting and reading: 0    Watching TV: 0    Sitting Inactive in public: 0    As a passenger in car: 1      Lying down to rest: 1    Sitting and talking: 0    Sitting quielty after lunch: 0    In a car, stopped in traffic: 0   TOTAL SCORE:   2 out of 24    SLEEP STUDIES:  1. PSG 03/2008 AHI 25 SpO82min 77%   CPAP COMPLIANCE DATA:  Date Range: 05/30/19-05/28/20  Average Daily Use: 5.2 hours  Median Use: 5.3  Compliance for > 4 Hours: 55%  AHI: 0.6 respiratory events per hour  Days Used:  300/365  Mask Leak: 7.2  95th Percentile Pressure: 4         LABS: Recent Results (from the past 2160 hour(s))  HgB A1c     Status: Abnormal   Collection Time: 03/23/20  4:48 PM  Result Value Ref Range   Hgb A1c MFr Bld 7.5 (H) 4.6 - 6.5 %    Comment: Glycemic Control Guidelines for People with Diabetes:Non Diabetic:  <6%Goal of Therapy: <7%Additional Action Suggested:  >4%   Basic Metabolic Panel (BMET)     Status: Abnormal   Collection Time: 03/23/20  4:48 PM  Result Value Ref Range   Sodium 144 135 - 145 mEq/L   Potassium 4.9 3.5 - 5.1 mEq/L   Chloride 104 96 - 112 mEq/L   CO2 33 (H) 19 - 32 mEq/L   Glucose, Bld 205 (H) 70 - 99 mg/dL   BUN 24 (H) 6 - 23 mg/dL   Creatinine, Ser 2.34 (H) 0.40 - 1.20 mg/dL   GFR 19.41 (L) >60.00 mL/min    Comment: Calculated using the CKD-EPI Creatinine Equation (2021)   Calcium 10.6 (H) 8.4 - 10.5 mg/dL    Radiology: No results found.  No results found.  No results found.    Assessment and Plan: Patient Active Problem List   Diagnosis Date Noted  . CPAP use counseling 06/01/2020  . Dysuria 03/02/2020  . Edema of lower extremity 11/19/2019  . Hyperlipidemia 11/15/2019  . Low back pain 11/15/2019  . Dry eyes 10/01/2019  . Tick bite 10/01/2019  . Vomiting 10/01/2019  . Personal history of other specified conditions 07/03/2019  . Fall 04/15/2019  . Type 2 diabetes mellitus ([place]) 03/18/2019  . Hypertension 03/18/2019  . Hyperparathyroidism due to renal insufficiency ([place]) 03/18/2019  . Headache disorder 09/10/2018  . Cervico-occipital neuralgia 07/23/2018  . Osteoporosis 07/23/2018  . Cramp and spasm 02/12/2018  . Paresthesia of hand 10/25/2017  . Lesion of rectum 09/07/2017  . Loss of height 08/15/2017  . 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 Cervical spondylosis without myelopathy 08/15/2017  . DDD (degenerative disc disease), cervical 08/15/2017  . Bursitis of hip 08/15/2017  . Female stress incontinence  08/15/2017  . Muscle weakness 08/15/2017  . Neck pain 08/15/2017  . Spondylolisthesis, congenital 08/15/2017  .  Radial styloid tenosynovitis 08/15/2017  . Senile osteoporosis 08/15/2017  . Hip pain 08/15/2017  . 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  . Rectal hemorrhage 07/25/2017  . Essential tremor 04/26/2017  . Other fatigue 03/22/2017  . Left ventricular hypertrophy 10/12/2016  . Chronic kidney disease, stage 4 (severe) ([place]) 08/26/2016  . OSA on CPAP 08/26/2016      The patient does tolerate PAP and reports significant benefit from PAP use.She is still removing the mask at night but does not know why. The patient is using the [place]  and advised to stop using it. Cleaning instructions were reviewed.  The compliance is poor. The apnea is well controlled.   1. OSA- continue nightly use of the CPAP and keep trying to increase use time. 2. CPAP use counseling- Pt reports good compliance with CPAP therapy. Cleaning machine by hand, and changing filters and tubing as directed. Denies headaches, sinus issues, palpitations, or hemoptysis.    General Counseling: I have discussed the findings of the evaluation and examination with Braylinn.  I have also discussed any further diagnostic evaluation thatmay be needed or ordered today. Caleb verbalizes understanding of the findings of todays visit. We also reviewed her medications today and discussed drug interactions and side effects including but not limited excessive drowsiness and altered mental states. We also discussed that there is always a risk not just to her but also people around her. she has been encouraged to call the office with any questions or concerns that should arise related to todays visit.  No orders of the defined types were placed in this encounter.       I have personally obtained a history, examined the patient, evaluated laboratory and imaging results, formulated the assessment and plan and placed  orders.  This patient was seen today by Tressie Ellis, PA-C in collaboration with Dr. Devona Konig.    Richelle Ito Saunders Glance, PhD, FAASM  Diplomate, American Board of Sleep Medicine    Allyne Gee, MD [facility] Diplomate ABMS Pulmonary and Critical Care Medicine Sleep medicine

## 2020-06-08 ENCOUNTER — Other Ambulatory Visit: Payer: Self-pay

## 2020-06-10 ENCOUNTER — Ambulatory Visit (INDEPENDENT_AMBULATORY_CARE_PROVIDER_SITE_OTHER): Payer: Medicare Other | Admitting: Family Medicine

## 2020-06-10 ENCOUNTER — Other Ambulatory Visit: Payer: Self-pay

## 2020-06-10 ENCOUNTER — Telehealth: Payer: Self-pay | Admitting: *Deleted

## 2020-06-10 ENCOUNTER — Encounter: Payer: Self-pay | Admitting: Family Medicine

## 2020-06-10 DIAGNOSIS — E1122 Type 2 diabetes mellitus with diabetic chronic kidney disease: Secondary | ICD-10-CM

## 2020-06-10 DIAGNOSIS — F418 Other specified anxiety disorders: Secondary | ICD-10-CM | POA: Diagnosis not present

## 2020-06-10 DIAGNOSIS — W19XXXA Unspecified fall, initial encounter: Secondary | ICD-10-CM | POA: Diagnosis not present

## 2020-06-10 DIAGNOSIS — Z794 Long term (current) use of insulin: Secondary | ICD-10-CM

## 2020-06-10 DIAGNOSIS — N184 Chronic kidney disease, stage 4 (severe): Secondary | ICD-10-CM | POA: Diagnosis not present

## 2020-06-10 MED ORDER — DULOXETINE HCL 30 MG PO CPEP: 30.0000 mg | ORAL_CAPSULE | Freq: Every day | ORAL | 3 refills | Status: DC

## 2020-06-10 NOTE — Assessment & Plan Note (Signed)
Much improved with Cymbalta 30 mg daily.  She will continue this.  We will follow up in 2 months.

## 2020-06-10 NOTE — Assessment & Plan Note (Signed)
Mechanical fall related to dog leash and her straps getting caught in her legs.  Encouraged her to be as safe as possible and to not let those types of things get around her feet.

## 2020-06-10 NOTE — Progress Notes (Signed)
Tommi Rumps, MD Phone: [phone]  Kelly [name] is a 80 y.o. female who presents today for f/u.  DIABETES Disease Monitoring: Blood Sugar ranges-100-110 Polyuria/phagia/dipsia- no      Optho- UTD Medications: Compliance- taking basaglar 8 u daily, victoza 0.6 mg daily Hypoglycemic symptoms- no  Anxiety: This is quite a bit better on the Cymbalta.  She denies any anxiety or depression symptoms currently.  Fall: Patient notes she had a fall at her that office.  She was getting her dog out of the car and the leash and her purse straps got caught up in her legs.  She fell onto her left elbow and notes bruising and abrasions though no other injuries.  No loss of consciousness.  No head injury.  Notes she is doing well now.   Social History   Tobacco Use  Smoking Status Never Smoker  Smokeless Tobacco Never Used    Current Outpatient Medications on File Prior to Visit  Medication Sig Dispense Refill  . amLODipine (NORVASC) 5 MG tablet Take 1 tablet (5 mg total) by mouth daily. 90 tablet 3  . aspirin EC 81 MG tablet Take 81 mg by mouth daily.    Marland Kitchen atorvastatin (LIPITOR) 80 MG tablet TAKE 1 TABLET DAILY 90 tablet 1  . calcitRIOL (ROCALTROL) 0.25 MCG capsule Take 0.25 mcg by mouth daily.    . Calcium Citrate-Vitamin D (CALCIUM CITRATE +D PO) Take 600 mg by mouth daily.     . Cholecalciferol (VITAMIN D) 50 MCG (2000 UT) CAPS Take 2,000 Units by mouth daily.     . Continuous Blood Gluc Sensor (FREESTYLE LIBRE 2 SENSOR) MISC Use to check sugar at least 4 times daily 6 each 3  . cyclobenzaprine (FLEXERIL) 10 MG tablet TAKE 1/2 TABLET (5MG  TOTAL)3 TIMES A DAY AS NEEDED    FOR MUSCLE SPASMS 45 tablet 0  . denosumab (PROLIA) 60 MG/ML SOSY injection Inject 60 mg into the skin every 6 (six) months.    . furosemide (LASIX) 40 MG tablet Take 1 tablet (40 mg total) by mouth daily as needed. 90 tablet 0  . gabapentin (NEURONTIN) 100 MG capsule Take 1 capsule (100 mg total) by mouth 2  (two) times daily. 180 capsule 1  . glucosamine-chondroitin 500-400 MG tablet Take 1 tablet by mouth 3 (three) times daily.    Marland Kitchen glucose blood (ACCU-CHEK AVIVA PLUS) test strip Use to check blood sugar up to 4 times daily 100 each 3  . HYDROcodone-acetaminophen (NORCO/VICODIN) 5-325 MG tablet Norco 5 mg-325 mg tablet  Take 1 tablet twice a day by oral route as needed.    . Insulin Glargine (BASAGLAR KWIKPEN) 100 UNIT/ML Inject 8 Units into the skin daily. 15 mL 0  . Lancets MISC Use up to 4 times daily to check blood sugars. 200 each 11  . liraglutide (VICTOZA) 18 MG/3ML SOPN Inject 0.6 mg into the skin daily. 3 mL 1  . losartan (COZAAR) 50 MG tablet TAKE 1 TABLET DAILY 90 tablet 1  . Melatonin 5 MG CHEW Chew 5 mg by mouth daily.    . potassium chloride (K-DUR) 10 MEQ tablet      No current facility-administered medications on file prior to visit.     ROS see history of present illness  Objective  Physical Exam Vitals:   06/10/20 1314  BP: 110/70  Pulse: 88  Temp: 97.8 F (36.6 C)  SpO2: 94%    BP Readings from Last 3 Encounters:  06/10/20 110/70  06/01/20 113/61  05/13/20 128/76   Wt Readings from Last 3 Encounters:  06/10/20 123 lb 6.4 oz (56 kg)  06/01/20 125 lb (56.7 kg)  05/13/20 125 lb (56.7 kg)    Physical Exam Constitutional:      General: She is not in acute distress.    Appearance: She is not diaphoretic.  Cardiovascular:     Rate and Rhythm: Normal rate and regular rhythm.     Heart sounds: Normal heart sounds.  Pulmonary:     Effort: Pulmonary effort is normal.     Breath sounds: Normal breath sounds.  Musculoskeletal:        General: No edema.     Comments: Scattered bruises around her right elbow with some abrasions, no tenderness, no bony tenderness or bony defects noted, full range of motion right elbow  Skin:    General: Skin is warm and dry.  Neurological:     Mental Status: She is alert.      Assessment/Plan: Please see individual  problem list.  Problem List Items Addressed This Visit    Fall    Mechanical fall related to dog leash and her straps getting caught in her legs.  Encouraged her to be as safe as possible and to not let those types of things get around her feet.      Mixed anxiety and depressive disorder    Much improved with Cymbalta 30 mg daily.  She will continue this.  We will follow up in 2 months.      Relevant Medications   DULoxetine (CYMBALTA) 30 MG capsule   Type 2 diabetes mellitus ([place])    Reported CBGs seem reasonable.  She will continue with her current regimen.  Plan to follow-up in 2 months with check of A1c.          This visit occurred during the SARS-CoV-2 public health emergency.  Safety protocols were in place, including screening questions prior to the visit, additional usage of staff PPE, and extensive cleaning of exam room while observing appropriate contact time as indicated for disinfecting solutions.    Tommi Rumps, MD [place]

## 2020-06-10 NOTE — Patient Instructions (Signed)
Nice to see you. We will continue with the Cymbalta 30 mg daily.  I will see you back in 2 months.

## 2020-06-10 NOTE — Telephone Encounter (Signed)
   06/10/2020  Kelly [name] 1940/07/15 [id number]    Outreach Attempt:  RN Care Manager outreached to patient for initial assessment for Chronic Care Management engagement with nursing.  Patient answered and stated she was not able to speak today due to going to doctors appointment.  Request call back.  Plan:  RN Care Manager will outreach to patient again within the next 10 business days per her request.  Hubert Azure RN RN Care Coordinator  [phone] [email or web address]

## 2020-06-10 NOTE — Assessment & Plan Note (Signed)
Reported CBGs seem reasonable.  She will continue with her current regimen.  Plan to follow-up in 2 months with check of A1c.

## 2020-06-16 ENCOUNTER — Encounter: Payer: Self-pay | Admitting: *Deleted

## 2020-06-16 ENCOUNTER — Ambulatory Visit (INDEPENDENT_AMBULATORY_CARE_PROVIDER_SITE_OTHER): Payer: Medicare Other | Admitting: *Deleted

## 2020-06-16 DIAGNOSIS — N184 Chronic kidney disease, stage 4 (severe): Secondary | ICD-10-CM

## 2020-06-16 DIAGNOSIS — I10 Essential (primary) hypertension: Secondary | ICD-10-CM | POA: Diagnosis not present

## 2020-06-16 DIAGNOSIS — Z794 Long term (current) use of insulin: Secondary | ICD-10-CM | POA: Diagnosis not present

## 2020-06-16 DIAGNOSIS — E1122 Type 2 diabetes mellitus with diabetic chronic kidney disease: Secondary | ICD-10-CM

## 2020-06-16 NOTE — Patient Instructions (Addendum)
Visit Information  Nice Speaking with you today.  Glad to hear the new medication is helping you feel better.  Please call me with any questions or concerns.  Hubert Azure RN Care Manager [phone])  PATIENT GOALS: Goals Addressed              This Visit's Progress   .  (CCM RNCM) Monitor and Manage My Blood Sugar        Timeframe:  Long-Range Goal Priority:  Medium Start Date:  06/16/20                           Expected End Date:  12/13/20                     Follow Up Date 07/22/20   . Check blood sugar at prescribed times (at least 4 times a day)  . Check blood sugar if I feel it is too high or too low, calling provider for sustained highs or lows . Enter blood sugar readings and medication or insulin into daily log  . Take the blood sugar log to all doctor visits  . Continue to keep food diary notating what foods elevate blood sugars . Try to increase fruits and vegetables in diet (eat them daily) . Take medication as prescribed . Current Hgb A1C is 7.5 with goal being below 7   Why is this important?   Checking your blood sugar at home helps to keep it from getting very high or very low.  Writing the results in a diary or log helps the doctor know how to care for you.  Your blood sugar log should have the time, date and the results.  Also, write down the amount of insulin or other medicine that you take.  Other information, like what you ate, exercise done and how you were feeling, will also be helpful.      .  (CCM RNCM) Track and Manage My Blood Pressure-Hypertension        Timeframe:  Long-Range Goal Priority:  Medium Start Date: 06/16/20                            Expected End Date:  12/13/20                     Follow Up Date 07/22/20    . Check blood pressure daily . Write blood pressure results in a log or diary and take to medical appointments . Continue to monitor self for signs and symptoms of high blood pressure (swelling in extremities, headache,  etc) . Increase activity as tolerated and weather allows   Why is this important?    You won't feel high blood pressure, but it can still hurt your blood vessels.   High blood pressure can cause heart or kidney problems. It can also cause a stroke.   Making lifestyle changes like losing a little weight or eating less salt will help.   Checking your blood pressure at home and at different times of the day can help to control blood pressure.   If the doctor prescribes medicine remember to take it the way the doctor ordered.   Call the office if you cannot afford the medicine or if there are questions about it.      Patient verbalizes understanding of instructions provided today and agrees to view in [place].  The care management team will reach out to the patient again over the next 45 days.   Hubert Azure RN, MSN RN Care Management Coordinator [place] [phone] [email or web address]   Diabetes Mellitus and Sick Day Management Blood sugar (glucose) can be difficult to control when you are sick. Common illnesses that can cause problems for people with diabetes (diabetes mellitus) include colds, fever, flu (influenza), nausea, vomiting, and diarrhea. These illnesses can cause stress and loss of body fluids (dehydration), and those issues can cause blood glucose levels to increase. Because of this, it is very important to take your insulin and diabetes medicines and eat some form of carbohydrate when you are sick. You should make a plan for days when you are sick (sick day plan) as part of your diabetes management plan. You and your health care provider should make this plan in advance. The following guidelines are intended to help you manage an illness that lasts for about 24 hours or less. Your health care provider may also give you more specific instructions. How to manage your blood glucose  Check your blood glucose every 2-4 hours, or as  often as told by your health care provider.  If you use insulin, take your usual dose. If your blood glucose continues to be too high, you may need to take an additional insulin dose as told by your health care provider.  Know your sick day treatment goals. Your target blood glucose levels may be different when you are sick.  If you use oral diabetes medicine, continue to take your medicines. Have a plan with your health care provider for these medicines while you are sick.  If you use injectable hormone medicines other than insulin to control your diabetes, have a plan with your health care provider for these medicines while you are sick.   Follow these instructions at home Check your ketones  If you have type 1 diabetes, check your urine ketones every 4 hours.  If you have type 2 diabetes, check your urine ketones as often as told by your health care provider. Eating and drinking  Drink enough fluid to keep your urine pale yellow. This is especially important if you have a fever, vomiting, or diarrhea. Those symptoms can lead to dehydration.  Follow instructions from your health care provider about beverages to avoid.  Do not drink alcohol, caffeine, or drinks that contain a lot of sugar.  You need to eat some form of carbohydrates when you are sick. Eat 45-50 grams (45-50 g) of carbohydrates every 3-4 hours until you feel better. All of the food choices below contain about 15 g of carbohydrates. Plan ahead and keep some of these foods around so you have them if you get sick. ? 4-6 oz (120-177 mL) carbonated beverage that contains sugar, such as regular (not diet) soda. You may be able to drink carbonated beverages more easily if you open the beverage and let it sit at room temperature for a few minutes before drinking. ?  of a twin frozen ice pop. ? 4 oz (120 g) regular gelatin. ? 4 oz (120 mL) fruit juice. ? 4 oz (120 g) ice cream or frozen yogurt. ? 2 oz (60 g) sherbet. ? 1 slice  bread or toast. ? 6 saltine crackers. ? 5 vanilla wafers. Medicines  Take-over-the-counter and prescription medicines only as told by your health care provider.  Check medicine labels for added sugars. Some medicines may contain sugar or types of sugars that can  raise your blood glucose level. Questions to ask your health care provider  Should I adjust my diabetes medicines?  How often do I need to check my blood glucose?  What supplies do I need to manage my diabetes at home when I am sick?  What number can I call if I have questions?  What foods and drinks should I avoid? Contact a health care provider if:  You have been sick or have had a fever for 2 days or longer and you are not getting better.  Your blood glucose is at or above 240 mg/dl (13.3 mmol/L), even after you take an additional insulin dose.  You are unable to drink fluids without vomiting.  You have any of the following for more than 6 hours: ? Nausea. ? Vomiting. ? Diarrhea. Get help right away if:  You have difficulty breathing.  You have moderate or high ketone levels in your urine.  You have a change in how you think, feel, or act (mental status).  You develop symptoms of diabetic ketoacidosis. These include: ? Nausea. ? Vomiting. ? Excessive thirst. ? Excessive urination. ? Fruity or sweet smelling breath. ? Rapid breathing. ? Pain in the abdomen.  Your blood glucose is lower than 54mg /dl (3.0 mmol/L).  You used emergency glucagon to treat low blood glucose. These symptoms may represent a serious problem that is an emergency. Do not wait to see if the symptoms will go away. Get medical help right away. Call your local emergency services (911 in the [place] Do not drive yourself to the hospital. Summary  Blood sugar (glucose) can be difficult to control when you are sick. Common illnesses that can cause problems for people with diabetes (diabetes mellitus) include colds, fever, flu (influenza),  nausea, vomiting, and diarrhea.  Illnesses can cause stress and loss of body fluids (dehydration), and those issues can cause blood glucose levels to increase.  Make a plan for days when you are sick (sick day plan) as part of your diabetes management plan. You and your health care provider should make this plan in advance.  It is very important to take your insulin and diabetes medicines and to eat some form of carbohydrate when you are sick.  Contact your health care provider if have problems managing your blood glucose levels when you are sick, or if you have been sick or had a fever for 2 days or longer and are not getting better. This information is not intended to replace advice given to you by your health care provider. Make sure you discuss any questions you have with your health care provider. Document Revised: 05/16/2019 Document Reviewed: 05/16/2019 Elsevier Patient Education  2021 [employer].

## 2020-06-16 NOTE — Chronic Care Management (AMB) (Signed)
Chronic Care Management   CCM RN Visit Note  06/16/2020 Name: Kelly [name] MRN: [id number] DOB: [birth year]  Subjective: Kelly [name] is a 80 y.o. year old female who is a primary care patient of Caryl Bis, Angela Adam, MD. The care management team was consulted for assistance with disease management and care coordination needs.    Engaged with patient by telephone for initial visit in response to provider referral for case management and/or care coordination services.   Consent to Services:  The patient was given the following information about Chronic Care Management services today, agreed to services, and gave verbal consent: 1. CCM service includes personalized support from designated clinical staff supervised by the primary care provider, including individualized plan of care and coordination with other care providers 2. 24/7 contact phone numbers for assistance for urgent and routine care needs. 3. Service will only be billed when office clinical staff spend 20 minutes or more in a month to coordinate care. 4. Only one practitioner may furnish and bill the service in a calendar month. 5.The patient may stop CCM services at any time (effective at the end of the month) by phone call to the office staff. 6. The patient will be responsible for cost sharing (co-pay) of up to 20% of the service fee (after annual deductible is met). Patient agreed to services and consent obtained.  Patient agreed to services and verbal consent obtained.   Assessment: Review of patient past medical history, allergies, medications, health status, including review of consultants reports, laboratory and other test data, was performed as part of comprehensive evaluation and provision of chronic care management services.   SDOH (Social Determinants of Health) assessments and interventions performed:    CCM Care Plan  Allergies  Allergen Reactions  . Prilosec Otc [Omeprazole Magnesium] Other (See  Comments)    Maybe cause of acute interstitial nephritis  . Sucralfate Other (See Comments)    Maybe cause of acute interstitial nephritis  . Amoxicillin Diarrhea  . Morphine And Related Other (See Comments)    Chest pains Chest pains    Outpatient Encounter Medications as of 06/16/2020  Medication Sig Note  . amLODipine (NORVASC) 5 MG tablet Take 1 tablet (5 mg total) by mouth daily.   Marland Kitchen aspirin EC 81 MG tablet Take 81 mg by mouth daily.   Marland Kitchen atorvastatin (LIPITOR) 80 MG tablet TAKE 1 TABLET DAILY   . calcitRIOL (ROCALTROL) 0.25 MCG capsule Take 0.25 mcg by mouth daily.   . Calcium Citrate-Vitamin D (CALCIUM CITRATE +D PO) Take 600 mg by mouth daily.    . Cholecalciferol (VITAMIN D) 50 MCG (2000 UT) CAPS Take 2,000 Units by mouth daily.    . Continuous Blood Gluc Sensor (FREESTYLE LIBRE 2 SENSOR) MISC Use to check sugar at least 4 times daily   . cyclobenzaprine (FLEXERIL) 10 MG tablet TAKE 1/2 TABLET (5MG TOTAL)3 TIMES A DAY AS NEEDED    FOR MUSCLE SPASMS   . denosumab (PROLIA) 60 MG/ML SOSY injection Inject 60 mg into the skin every 6 (six) months.   . DULoxetine (CYMBALTA) 30 MG capsule Take 1 capsule (30 mg total) by mouth daily.   . furosemide (LASIX) 40 MG tablet Take 1 tablet (40 mg total) by mouth daily as needed.   . gabapentin (NEURONTIN) 100 MG capsule Take 1 capsule (100 mg total) by mouth 2 (two) times daily. 05/20/2020: Taking QPM  . glucosamine-chondroitin 500-400 MG tablet Take 1 tablet by mouth 3 (three) times daily. 06/16/2020: Reports taking  once a day  . HYDROcodone-acetaminophen (NORCO/VICODIN) 5-325 MG tablet Norco 5 mg-325 mg tablet  Take 1 tablet twice a day by oral route as needed. 12/18/2019: Never takes more than 1 per day; takes ~3 times per week  . Insulin Glargine (BASAGLAR KWIKPEN) 100 UNIT/ML Inject 8 Units into the skin daily.   Marland Kitchen liraglutide (VICTOZA) 18 MG/3ML SOPN Inject 0.6 mg into the skin daily.   Marland Kitchen losartan (COZAAR) 50 MG tablet TAKE 1 TABLET DAILY   .  Melatonin 5 MG CHEW Chew 5 mg by mouth daily.   . potassium chloride (K-DUR) 10 MEQ tablet  11/06/2017: Only takes when she takes furosemide  . glucose blood (ACCU-CHEK AVIVA PLUS) test strip Use to check blood sugar up to 4 times daily   . Lancets MISC Use up to 4 times daily to check blood sugars.    No facility-administered encounter medications on file as of 06/16/2020.    Patient Active Problem List   Diagnosis Date Noted  . CPAP use counseling 06/01/2020  . Dysuria 03/02/2020  . Edema of lower extremity 11/19/2019  . Hyperlipidemia 11/15/2019  . Low back pain 11/15/2019  . Dry eyes 10/01/2019  . Tick bite 10/01/2019  . Vomiting 10/01/2019  . Personal history of other specified conditions 07/03/2019  . Fall 04/15/2019  . Type 2 diabetes mellitus ([place]) 03/18/2019  . Hypertension 03/18/2019  . 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 Headache disorder 09/10/2018  . Cervico-occipital neuralgia 07/23/2018  . Osteoporosis 07/23/2018  . Cramp and spasm 02/12/2018  . Paresthesia of hand 10/25/2017  . Lesion of rectum 09/07/2017  . Loss of height 08/15/2017  . 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 Cervical spondylosis without myelopathy 08/15/2017  . DDD (degenerative disc disease), cervical 08/15/2017  . Bursitis of hip 08/15/2017  . Female stress incontinence 08/15/2017  . Muscle weakness 08/15/2017  . Neck pain 08/15/2017  . Spondylolisthesis, congenital 08/15/2017  . Radial styloid tenosynovitis 08/15/2017  . Senile osteoporosis 08/15/2017  . Hip pain 08/15/2017  . 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  . Rectal hemorrhage 07/25/2017  . Essential tremor 04/26/2017  . Other fatigue 03/22/2017  . Left ventricular hypertrophy 10/12/2016  . Chronic kidney disease, stage 4 (severe) ([place]) 08/26/2016  . OSA on CPAP 08/26/2016    Conditions to be addressed/monitored:HTN and DMII  Care Plan : Diabetes Type 2 (Adult)  Updates made by  Leona Singleton, RN since 06/16/2020 12:00 AM  Problem: Glycemic Management (Diabetes, Type 2)   Priority: Medium  Long-Range Goal: Glycemic Management Optimized   Start Date: 06/16/2020  Expected End Date: 12/13/2020  Priority: Medium  Objective:  Lab Results  Component Value Date   HGBA1C 7.5 (H) 03/23/2020 .   Lab Results  Component Value Date   CREATININE 2.34 (H) 03/23/2020   CREATININE 2.34 (H) 03/02/2020   CREATININE 2.68 (H) 10/20/2019  Current Barriers:  Marland Kitchen Knowledge Deficits related to basic Diabetes pathophysiology and self care/management as evidenced by continued Hgb A1C above goal.  Reports she is now using the free style libre continuous monitoring system and checks blood sugars greater than 5 times a day.  Has added Victoza to her medications without any nausea or vomiting and feels her blood sugars are better controlled.  States her fasting blood has been ranging 100's with post prandial 130-140's.  Denies any recent episodes of hypoglycemia Case Manager Clinical Goal(s):  Marland Kitchen Patient will demonstrate improved adherence to prescribed treatment plan for diabetes self care/management  as evidenced by: Four times a day monitoring and recording of CBG, adherence to ADA/ carb modified diet, exercise at least 3 days/week, adherence to prescribed medication regimen Interventions:  . Collaboration with [name], MD regarding development and update of comprehensive plan of care as evidenced by provider attestation and co-signature . Inter-disciplinary care team collaboration (see longitudinal plan of care) . Reviewed medications with patient and discussed importance of medication adherence . Discussed plans with patient for ongoing care management follow up and provided patient with direct contact information for care management team . 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 Reviewed scheduled/upcoming provider appointments including: upcoming primary care appointment in April 2022 and with CCM Pharmacist in March 2022 . Advised patient, providing education and rationale, to check cbg at least four times  a day and record, calling primary care provider or CCM Pharmacist for findings outside established parameters.   . Discussed food diary and carbohydrate modified diet, encouraged to continue to keep food diary taking to follow up appointments, encouraged healthier food options and increasing vegetable intake . Encouraged to continue to work with CCM Pharmacist  . Reviewed current Hgb A1C and what established goal is and ways to help reduce to get to goal . Sending education on [place] Patient Goals/Self-Care Activities Over the next 30 days, patient will:  . Check blood sugar at prescribed times (at least 4 times a day)  . Check blood sugar if I feel it is too high or too low, calling provider for sustained highs or lows . Enter blood sugar readings and medication or insulin into daily log  . Take the blood sugar log to all doctor visits  . Continue to keep food diary notating what foods elevate blood sugars . Try to increase fruits and vegetables in diet (eat them daily) . Take medication as prescribed . Current Hgb A1C is 7.5 with goal being below 7 Follow Up Plan: The care management team will reach out to the patient again over the next 45 days.   Care Plan : Hypertension (Adult)  Updates made by Leona Singleton, RN since 06/16/2020 12:00 AM  Problem: Hypertension (Hypertension)   Priority: Medium  Long-Range Goal: Hypertension Monitored   Start Date: 06/16/2020  Expected End Date: 12/13/2020  Priority: Medium  Current Barriers:  Marland Kitchen Knowledge Deficits related to basic understanding of hypertension pathophysiology and self care management as evidenced by patient reporting she is monitoring blood  pressures a few times a week, not recording pressures and unable to recall recent blood pressures checked.  States her "top" (systolic) numbers are usually in 130's but unsure of what her "bottom" (diastolic) numbers are.  Patient is reporting that her mood is a lot better since starting Cymbalta. Nurse Case Manager Clinical Goal(s):  Marland Kitchen Over the next 90 days, patient will verbalize understanding of plan for hypertension management . Over the next 90 days, patient will attend all scheduled medical appointments: with Primary Care Provider in April and CCM Pharmacist in March . Over the next 90 days, patient will demonstrate improved adherence to prescribed treatment plan for hypertension as evidenced by taking all medications as prescribed, monitoring and recording blood pressure as directed, adhering to low sodium/DASH diet Interventions:  . Collaboration with [name], MD regarding development and update of comprehensive plan of  care as evidenced by provider attestation and co-signature . Inter-disciplinary care team collaboration (see longitudinal plan of care) . Evaluation of current treatment plan related to hypertension self management and patient's adherence to plan as established by provider. . Reviewed medications with patient and discussed importance of compliance . Discussed plans with patient for ongoing care management follow up and provided patient with direct contact information for care management team . Advised patient, providing education and rationale, to monitor blood pressure daily and record, calling PCP for findings outside established parameters.  . Reviewed scheduled/upcoming provider appointments including: with Primary Care Provider in April 5th and CCM Pharmacist in March 9th  . Sending 2022 [place] to help record vital signs . Encouraged to increase activity as tolerated, fall precautions and preventions reviewed and discussed (mechanical fall few weeks  ago tripping over dog leash getting out car) . Reviewed signs and symptoms of hypertension and encouraged patient to continue to self monitor Patient Goals/Self-Care Activities:  Over the next 30 days, patient will: . Check blood pressure daily . Write blood pressure results in a log or diary and take to medical appointments . Continue to monitor self for signs and symptoms of high blood pressure (swelling in extremities, headache, etc) . Increase activity as tolerated and weather allows Follow Up Plan: The care management team will reach out to the patient again over the next 45 days.       Plan:The care management team will reach out to the patient again over the next 45 days.  Hubert Azure RN, MSN RN Care Management Coordinator [place] [phone] [email or web address]

## 2020-07-02 ENCOUNTER — Other Ambulatory Visit: Payer: Self-pay | Admitting: Family Medicine

## 2020-07-15 ENCOUNTER — Other Ambulatory Visit: Payer: Self-pay

## 2020-07-15 ENCOUNTER — Ambulatory Visit: Payer: Medicare Other

## 2020-07-17 ENCOUNTER — Ambulatory Visit (INDEPENDENT_AMBULATORY_CARE_PROVIDER_SITE_OTHER): Payer: Medicare Other | Admitting: Pharmacist

## 2020-07-17 ENCOUNTER — Other Ambulatory Visit: Payer: Self-pay

## 2020-07-17 DIAGNOSIS — E785 Hyperlipidemia, unspecified: Secondary | ICD-10-CM

## 2020-07-17 DIAGNOSIS — Z794 Long term (current) use of insulin: Secondary | ICD-10-CM

## 2020-07-17 DIAGNOSIS — I10 Essential (primary) hypertension: Secondary | ICD-10-CM

## 2020-07-17 DIAGNOSIS — F418 Other specified anxiety disorders: Secondary | ICD-10-CM

## 2020-07-17 DIAGNOSIS — N184 Chronic kidney disease, stage 4 (severe): Secondary | ICD-10-CM

## 2020-07-17 MED ORDER — BASAGLAR KWIKPEN 100 UNIT/ML ~~LOC~~ SOPN: 6.0000 [IU] | PEN_INJECTOR | Freq: Every day | SUBCUTANEOUS | 3 refills | Status: DC

## 2020-07-17 MED ORDER — VICTOZA 18 MG/3ML ~~LOC~~ SOPN: 1.2000 mg | PEN_INJECTOR | Freq: Every day | SUBCUTANEOUS | 3 refills | Status: DC

## 2020-07-17 NOTE — Patient Instructions (Addendum)
Ms. Beaston,   Keep up the great work!  Increase Victoza to 1.2 mg daily. If this ends up causing too much nausea, cut it down by 2-3 clicks.   Decrease Basaglar to 6 units daily.   Call me with any questions or concerns!  [name], PharmD [phone]  Visit Information  PATIENT GOALS: Goals Addressed              This Visit's Progress     Patient Stated   .  PharmD - Patient Self Monitoring (pt-stated)        Patient Goals/Self-Care Activities . Over the next 90 days, patient will:  - take medications as prescribed - check glucose at least three times daily using continuous glucose monitor, document, and provide at future appointments - target a minimum of 150 minutes of moderate intensity exercise weekly - start keeping a food diary to better monitor impact of dietary choices       Print copy of patient instructions, educational materials, and care plan provided in person.   Plan: Face to Face appointment with care management team member scheduled for: ~ 12 weeks  [name], PharmD, [place], [place] Clinical Pharmacist [employer] at [employer] [phone]

## 2020-07-17 NOTE — Chronic Care Management (AMB) (Signed)
Chronic Care Management Pharmacy Note  07/17/2020 Name:  Kelly [name] MRN:  [id number] DOB:  Dec 05, 1940  Subjective: Kelly [name] is an 80 y.o. year old female who is a primary patient of Sonnenberg, Angela Adam, MD.  The CCM team was consulted for assistance with disease management and care coordination needs.    Engaged with patient face to face for follow up visit in response to provider referral for pharmacy case management and/or care coordination services.   Consent to Services:  The patient was given information about Chronic Care Management services, agreed to services, and gave verbal consent prior to initiation of services.  Please see initial visit note for detailed documentation.   Patient Care Team: [name], MD as PCP - General (Family Medicine) [name], RPH-CPP as Pharmacist (Pharmacist) Murlean Iba, MD (Nephrology) Felipa Furnace, DPM as Consulting Physician (Podiatry) Leona Singleton, RN as Case Manager  Recent office visits:  2/2 - PCP; continue duloxetine due to mood improvement  2/8 - RN CM phone visit, disease management education  Recent consult visits:  2/4- Dr. Humphrey Rolls pulmonary, CPAP f/u  1/24 - Dr. Candiss Norse nephrology, continue regimen. eGFR 19  Hospital visits: None in previous 6 months  Objective:  Lab Results  Component Value Date   CREATININE 2.34 (H) 03/23/2020   BUN 24 (H) 03/23/2020   GFR 19.41 (L) 03/23/2020   GFRNONAA 16 (L) 10/20/2019   GFRAA 19 (L) 10/20/2019   NA 144 03/23/2020   K 4.9 03/23/2020   CALCIUM 10.6 (H) 03/23/2020   CO2 33 (H) 03/23/2020    Lab Results  Component Value Date/Time   HGBA1C 7.5 (H) 03/23/2020 04:48 PM   HGBA1C 7.6 (H) 08/07/2019 01:56 PM   GFR 19.41 (L) 03/23/2020 04:48 PM   GFR 19.42 (L) 03/02/2020 11:13 AM   MICROALBUR 4.5 (H) 08/02/2017 10:22 AM    Last diabetic Eye exam:  Lab Results  Component Value Date/Time   HMDIABEYEEXA No Retinopathy 12/31/2019  04:30 PM    Last diabetic Foot exam: No results found for: HMDIABFOOTEX    Lab Results  Component Value Date   CHOL 107 04/22/2019   HDL 27.40 (L) 04/22/2019   LDLCALC 46 04/22/2019   LDLDIRECT 78.0 02/18/2019   TRIG 168.0 (H) 04/22/2019   CHOLHDL 4 04/22/2019    Hepatic Function Latest Ref Rng & Units 04/22/2019 04/11/2019 02/18/2019  Total Protein 6.0 - 8.3 g/dL 6.0 7.1 6.1  Albumin 3.5 - 5.2 g/dL 3.8 3.9 3.8  AST 0 - 37 U/L _0 ALT 0 - 35 U/L 34 21 26  Alk Phosphatase 39 - 117 U/L 79 73 75  Total Bilirubin 0.2 - 1.2 mg/dL 0.6 0.9 0.6  Bilirubin, Direct 0.0 - 0.3 mg/dL - - 0.1    Lab Results  Component Value Date/Time   TSH 3.93 03/22/2017 10:43 AM   TSH 0.719 07/09/2016 03:12 AM    CBC Latest Ref Rng & Units 10/20/2019 04/11/2019 07/17/2018  WBC 4.0 - 10.5 K/uL 9.7 6.4 7.6  Hemoglobin 12.0 - 15.0 g/dL 14.1 14.5 14.0  Hematocrit 36.0 - 46.0 % 43.4 45.3 42.4  Platelets 150 - 400 K/uL 163 191 175.0    Lab Results  Component Value Date/Time   VD25OH 76.94 02/18/2019 08:57 AM   VD25OH 111.67 (HH) 12/24/2018 11:15 AM    Clinical ASCVD: No    Depression screen [facility] 2/9 07/17/2020 06/16/2020 05/22/2020  Decreased Interest 1 0 0  Down, Depressed, Hopeless  0 0 1  PHQ - 2 Score 1 0 1  Altered sleeping - - -  Tired, decreased energy - - -  Change in appetite - - -  Feeling bad or failure about yourself  - - -  Trouble concentrating - - -  Moving slowly or fidgety/restless - - -  Suicidal thoughts - - -  PHQ-9 Score - - -  Difficult doing work/chores - - -  Some recent data might be hidden      Social History   Tobacco Use  Smoking Status Never Smoker  Smokeless Tobacco Never Used   BP Readings from Last 3 Encounters:  06/10/20 110/70  06/01/20 113/61  05/13/20 128/76   Pulse Readings from Last 3 Encounters:  06/10/20 88  06/01/20 81  05/13/20 74   Wt Readings from Last 3 Encounters:  06/10/20 123 lb 6.4 oz (56 kg)  06/01/20 125 lb (56.7 kg)   05/13/20 125 lb (56.7 kg)    Assessment/Interventions: Review of patient past medical history, allergies, medications, health status, including review of consultants reports, laboratory and other test data, was performed as part of comprehensive evaluation and provision of chronic care management services.   SDOH:  (Social Determinants of Health) assessments and interventions performed: Yes SDOH Interventions   Flowsheet Row Most Recent Value  SDOH Interventions   Financial Strain Interventions Intervention Not Indicated  Stress Interventions Other (Comment)  [encouraged continued adherence]      CCM Care Plan  Allergies  Allergen Reactions  . Prilosec Otc [Omeprazole Magnesium] Other (See Comments)    Maybe cause of acute interstitial nephritis  . Sucralfate Other (See Comments)    Maybe cause of acute interstitial nephritis  . Amoxicillin Diarrhea  . Morphine And Related Other (See Comments)    Chest pains Chest pains    Medications Reviewed Today    Reviewed by [name], RPH-CPP (Pharmacist) on 07/17/20 at 1308  Med List Status: <None>  Medication Order Taking? Sig Documenting Provider Last Dose Status Informant  amLODipine (NORVASC) 5 MG tablet [id number] Yes Take 1 tablet (5 mg total) by mouth daily. [name], MD Taking Active   aspirin EC 81 MG tablet [id number] Yes Take 81 mg by mouth daily. [provider] Taking Active Multiple Informants  atorvastatin (LIPITOR) 80 MG tablet [id number] Yes TAKE 1 TABLET DAILY [name], MD Taking Active   bisacodyl (DULCOLAX) 5 MG EC tablet [id number] Yes Take 5 mg by mouth daily as needed for moderate constipation. [provider] Taking Active   calcitRIOL (ROCALTROL) 0.25 MCG capsule [id number] Yes Take 0.25 mcg by mouth daily. [provider] Taking Active   Calcium Citrate-Vitamin D (CALCIUM CITRATE +D PO) [id number] Yes Take 600 mg by mouth daily.  [provider] Taking  Active   Cholecalciferol (VITAMIN D) 50 MCG (2000 UT) CAPS [id number] Yes Take 2,000 Units by mouth daily.  [provider] Taking Active   Continuous Blood Gluc Sensor (FREESTYLE LIBRE 2 SENSOR) [place] [id number] Yes Use to check sugar at least 4 times daily [name], MD Taking Active   cyclobenzaprine (FLEXERIL) 10 MG tablet [id number] Yes TAKE 1/2 TABLET (5MG TOTAL)3 TIMES A DAY AS NEEDED    FOR MUSCLE SPASMS [name], MD Taking Active   denosumab (PROLIA) 60 MG/ML SOSY injection [id number]  Inject 60 mg into the skin every 6 (six) months. [provider]  Active            Med  Note [name], Arville Lime   Wed May 20, 2020  2:28 PM)    DULoxetine (CYMBALTA) 30 MG capsule [id number] Yes Take 1 capsule (30 mg total) by mouth daily. [name], MD Taking Active   furosemide (LASIX) 40 MG tablet [id number] Yes Take 1 tablet (40 mg total) by mouth daily as needed. [name], MD Taking Active   gabapentin (NEURONTIN) 100 MG capsule [id number] Yes Take 1 capsule (100 mg total) by mouth 2 (two) times daily. [name], MD Taking Active            Med Note [name] May 20, 2020  2:27 PM) Taking QPM  glucosamine-chondroitin 500-400 MG tablet [id number] Yes Take 1 tablet by mouth 3 (three) times daily. [provider] Taking Active            Med Note [name], [facility] D   Tue Jun 16, 2020 11:13 AM) Reports taking once a day  glucose blood (ACCU-CHEK AVIVA PLUS) test strip [id number] Yes Use to check blood sugar up to 4 times daily [name], MD Taking Active   HYDROcodone-acetaminophen (NORCO/VICODIN) 5-325 MG tablet [id number] Yes Norco 5 mg-325 mg tablet  Take 1 tablet twice a day by oral route as needed. [provider] Taking Active            Med Note [name], Arville Lime   Wed Dec 18, 2019  3:23 PM) Never takes more than 1 per day; takes ~3 times per week  Insulin Glargine (BASAGLAR KWIKPEN) 100 UNIT/ML  [id number] Yes Inject 8 Units into the skin daily. [name], MD Taking Active   Lancets MISC [id number] Yes Use up to 4 times daily to check blood sugars. Coral Spikes, DO Taking Active   losartan (COZAAR) 50 MG tablet [id number] Yes TAKE 1 TABLET DAILY [name], MD Taking Active   Melatonin 5 MG CHEW [id number] Yes Chew 5 mg by mouth daily. [provider] Taking Active   potassium chloride (K-DUR) 10 MEQ tablet [id number] Yes  [provider] Taking Active            Med Note Thurmond Butts, CAROLINE E   Mon Nov 06, 2017 10:32 AM) Only takes when she takes furosemide  VICTOZA 18 MG/3ML SOPN [id number] Yes INJECT 0.3ML (1.8MG TOTAL) SUBCUTANEOUSLY DAILY [name], MD Taking Active            Med Note [name], Arville Lime   Fri Jul 17, 2020  1:06 PM) 0.6 mg daily           Patient Active Problem List   Diagnosis Date Noted  . CPAP use counseling 06/01/2020  . Dysuria 03/02/2020  . Edema of lower extremity 11/19/2019  . Hyperlipidemia 11/15/2019  . Low back pain 11/15/2019  . Dry eyes 10/01/2019  . Tick bite 10/01/2019  . Vomiting 10/01/2019  . Personal history of other specified conditions 07/03/2019  . Fall 04/15/2019  . Type 2 diabetes mellitus ([place]) 03/18/2019  . Hypertension 03/18/2019  . Hyperparathyroidism due to renal insufficiency ([place]) 03/18/2019  . Headache disorder 09/10/2018  . Cervico-occipital neuralgia 07/23/2018  . Osteoporosis 07/23/2018  . Cramp and spasm 02/12/2018  . Paresthesia of hand 10/25/2017  . Lesion of rectum 09/07/2017  . Loss of height 08/15/2017  . 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 Cervical spondylosis without myelopathy 08/15/2017  . DDD (degenerative disc disease), cervical 08/15/2017  . Bursitis of hip 08/15/2017  . Female  stress incontinence 08/15/2017  . Muscle weakness 08/15/2017  . Neck pain 08/15/2017  . Spondylolisthesis, congenital 08/15/2017  . Radial styloid tenosynovitis 08/15/2017  .  Senile osteoporosis 08/15/2017  . Hip pain 08/15/2017  . 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  . Rectal hemorrhage 07/25/2017  . Essential tremor 04/26/2017  . Other fatigue 03/22/2017  . Left ventricular hypertrophy 10/12/2016  . Chronic kidney disease, stage 4 (severe) ([place]) 08/26/2016  . OSA on CPAP 08/26/2016    Immunization History  Administered Date(s) Administered  . Fluad Quad(high Dose 65+) 02/25/2019, 03/02/2020  . Influenza, High Dose Seasonal PF 02/24/2017, 02/28/2018  . Influenza,inj,quad, With Preservative 02/19/2016  . Moderna Sars-Covid-2 Vaccination 07/05/2019, 08/06/2019, 03/02/2020  . Td 02/26/2019  . Zoster Recombinat (Shingrix) 03/10/2020    Conditions to be addressed/monitored:  Hypertension, Hyperlipidemia, Diabetes, Chronic Kidney Disease, Depression and Anxiety  Care Plan : Medication Management  Updates made by [name], RPH-CPP since 07/17/2020 12:00 AM    Problem: Diabetes, CKD, HTN, HLD     Long-Range Goal: Disease Progression Prevention   This Visit's Progress: On track  Priority: High  Note:   Current Barriers:   Unable to achieve control of diabetes   Unable to independently control mental health diagnoses   Pharmacist Clinical Goal(s):   Over the next 90 days, patient will achieve adherence to monitoring guidelines and medication adherence to achieve therapeutic efficacy.  Over the next 90 days, patient will a achieve control of diabetes as evidenced by A1c through collaboration with PharmD and provider.    Interventions:  1:1 collaboration with [name], MD regarding development and update of comprehensive plan of care as evidenced by provider attestation and co-signature  Inter-disciplinary care team collaboration (see longitudinal plan of care)  Comprehensive medication review performed; medication list updated in electronic medical record   Diabetes: . Uncontrolled; current treatment: Basaglar 8  units daily, Victoza 0.6 mg daily - Hx nausea, vomiting with Ozempic.  . Current glucose readings:  Date of Download: 2/26-3/11/22 % Time CGM is active: 95% Average Glucose: 163 mg/dL Glucose Management Indicator: 7.2  Glucose Variability: 35.5 (goal <36%) Time in Goal:  - Time in range 70-180: 68% - Time above range: 32% - Time below range: 0% Observed patterns: goal fastings, but elevated post prandial readings   . Denies episodes of hypoglycemia . Current meal patterns: reports that she had pizza the other night and ate 6 slices. Notes that she isn't eating fish sandwiches anymore, but hasn't been making low carb decisions with supper and post meal. Encouraged to re-start keeping food diary to track her decisions and resulting glucose control. . Discussed that as she had tolerated higher doses of Victoza in the past, this may be beneficial to help with portion control and post prandial glycemic control. Patient elects to increase dose today. Increase Victoza to 1.2 mg daily. Discussed that if this results in nausea, reduce by 2-3 clicks. To prevent hypoglycemia, reduce Basaglar to 6 units daily. Will follow for ability to d/c moving forward.  . Continue collaboration w/ RN CM for disease management education   Hypertension:  Controlled per last clinic BP; current treatment: amlodipine 5 mg daily, losartan 50 mg daily  Patient reports home checks: ~ 130/70s   Furosemide 40 mg + potassium 10 mEq PRN lower extremity edema (and "fluid eye bags"); has not used frequently  Recommended continue current regimen along with home monitoring.   Continue collaboration w/ RN CM for disease management education  Hyperlipidemia, ASCVD risk reduction  Controlled per last lipid panel; current treatment: atorvastatin 80 mg daily, ezetimibe 10 mg daily;   Antiplatelet regimen: aspirin 81 mg daily  Recommended continue current regimen   Depression, anxiety:  Improved but patient denies full  control; notes that she feels "more like myself", but still not as happy sometimes as she thinks she should; current regimen: duloxetine 30 mg daily  Continue current regimen at this time. Encouraged to discuss potential for dose increase at her next appointment w/ PCP in a few weeks   Osteoporosis w/ hypocalcemia:  Appropriately treated w/ Prolia Q6 months; last dose 03/2020; will be due again in May  Supplements: calcium 1200 mg + Vitamin D 1000 units daily; calcitriol 0.25 mcg daily per nephrology   Recommended to continue current regimen at this time   Chronic Pain - Degenerative Disc Disease, Arthritis:  Well controlled at this time; current regimen: gabapentin 100 mg BID, but notes that she is only taking 1 dose QPM. Denies any sedation or next day drowsiness from this medication; PRN hydrocodone/APAP 5/325 mg PRN- no more than once daily  cyclobenzaprine 10 mg - no more than once weekly; bisacodyl 5 mg daily  Recommended to continue current regimen at this time. Continue to monitor for increased sedation w/ age, changes in renal functoin   Patient Goals/Self-Care Activities  Over the next 90 days, patient will:  - take medications as prescribed - check glucose at least QID using continuous glucose monitor, document, and provide at future appointments - target a minimum of 150 minutes of moderate intensity exercise weekly - start keeping a food diary to better monitor impact of dietary choices   Follow Up Plan: Face to Face appointment with care management team member scheduled for:  ~ 12 weeks (PCP visit in ~ 4 weeks)      Medication Assistance: None required.  Patient affirms current coverage meets needs.  Patient's preferred pharmacy is:  [facility] [place], [address] [place] [address] Phone: [phone] Fax: [place], [address] [address] Phone: [phone] Fax: [place] and Follow Up Patient Decision:  Patient agrees to Care Plan and Follow-up.  Plan: Face to Face appointment with care management team member scheduled for: ~ 12 weeks  [name], PharmD, [place], [place] Clinical Pharmacist [employer] at [employer] [phone]

## 2020-07-21 DIAGNOSIS — M25519 Pain in unspecified shoulder: Secondary | ICD-10-CM | POA: Diagnosis not present

## 2020-07-21 DIAGNOSIS — E1165 Type 2 diabetes mellitus with hyperglycemia: Secondary | ICD-10-CM | POA: Diagnosis not present

## 2020-07-21 DIAGNOSIS — M5032 Other cervical disc degeneration, mid-cervical region, unspecified level: Secondary | ICD-10-CM | POA: Diagnosis not present

## 2020-07-21 DIAGNOSIS — M5136 Other intervertebral disc degeneration, lumbar region: Secondary | ICD-10-CM | POA: Diagnosis not present

## 2020-07-21 DIAGNOSIS — Q762 Congenital spondylolisthesis: Secondary | ICD-10-CM | POA: Diagnosis not present

## 2020-07-21 DIAGNOSIS — M25559 Pain in unspecified hip: Secondary | ICD-10-CM | POA: Diagnosis not present

## 2020-07-21 DIAGNOSIS — M25572 Pain in left ankle and joints of left foot: Secondary | ICD-10-CM | POA: Diagnosis not present

## 2020-07-21 DIAGNOSIS — Z79899 Other long term (current) drug therapy: Secondary | ICD-10-CM | POA: Diagnosis not present

## 2020-07-21 DIAGNOSIS — T2101XA Burn of unspecified degree of chest wall, initial encounter: Secondary | ICD-10-CM | POA: Diagnosis not present

## 2020-07-21 DIAGNOSIS — M6281 Muscle weakness (generalized): Secondary | ICD-10-CM | POA: Diagnosis not present

## 2020-07-21 DIAGNOSIS — M810 Age-related osteoporosis without current pathological fracture: Secondary | ICD-10-CM | POA: Diagnosis not present

## 2020-07-21 DIAGNOSIS — M7062 Trochanteric bursitis, left hip: Secondary | ICD-10-CM | POA: Diagnosis not present

## 2020-07-22 ENCOUNTER — Ambulatory Visit: Payer: Medicare Other | Admitting: *Deleted

## 2020-07-22 DIAGNOSIS — I10 Essential (primary) hypertension: Secondary | ICD-10-CM

## 2020-07-22 DIAGNOSIS — E785 Hyperlipidemia, unspecified: Secondary | ICD-10-CM | POA: Diagnosis not present

## 2020-07-22 DIAGNOSIS — E1122 Type 2 diabetes mellitus with diabetic chronic kidney disease: Secondary | ICD-10-CM | POA: Diagnosis not present

## 2020-07-22 DIAGNOSIS — Z794 Long term (current) use of insulin: Secondary | ICD-10-CM

## 2020-07-22 DIAGNOSIS — F418 Other specified anxiety disorders: Secondary | ICD-10-CM

## 2020-07-22 DIAGNOSIS — N184 Chronic kidney disease, stage 4 (severe): Secondary | ICD-10-CM | POA: Diagnosis not present

## 2020-07-22 NOTE — Chronic Care Management (AMB) (Signed)
Chronic Care Management   CCM RN Visit Note  07/22/2020 Name: LADY WISHAM MRN: [id number] DOB: 1940/08/07  Subjective: Stesha MEGHNA HAGMANN is a 80 y.o. year old female who is a primary care patient of Caryl Bis, Angela Adam, MD. The care management team was consulted for assistance with disease management and care coordination needs.    Engaged with patient by telephone for follow up visit in response to provider referral for case management and/or care coordination services.   Consent to Services:  The patient was given information about Chronic Care Management services, agreed to services, and gave verbal consent prior to initiation of services.  Please see initial visit note for detailed documentation.   Patient agreed to services and verbal consent obtained.   Assessment: Review of patient past medical history, allergies, medications, health status, including review of consultants reports, laboratory and other test data, was performed as part of comprehensive evaluation and provision of chronic care management services.   SDOH (Social Determinants of Health) assessments and interventions performed:    CCM Care Plan  Allergies  Allergen Reactions  . Prilosec Otc [Omeprazole Magnesium] Other (See Comments)    Maybe cause of acute interstitial nephritis  . Sucralfate Other (See Comments)    Maybe cause of acute interstitial nephritis  . Amoxicillin Diarrhea  . Morphine And Related Other (See Comments)    Chest pains Chest pains    Outpatient Encounter Medications as of 07/22/2020  Medication Sig Note  . Continuous Blood Gluc Sensor (FREESTYLE LIBRE 2 SENSOR) MISC Use to check sugar at least 4 times daily   . DULoxetine (CYMBALTA) 30 MG capsule Take 1 capsule (30 mg total) by mouth daily.   Marland Kitchen liraglutide (VICTOZA) 18 MG/3ML SOPN Inject 1.2 mg into the skin daily.   Marland Kitchen amLODipine (NORVASC) 5 MG tablet Take 1 tablet (5 mg total) by mouth daily.   Marland Kitchen aspirin EC 81 MG tablet  Take 81 mg by mouth daily.   Marland Kitchen atorvastatin (LIPITOR) 80 MG tablet TAKE 1 TABLET DAILY   . bisacodyl (DULCOLAX) 5 MG EC tablet Take 5 mg by mouth daily as needed for moderate constipation.   . calcitRIOL (ROCALTROL) 0.25 MCG capsule Take 0.25 mcg by mouth daily.   . Calcium Citrate-Vitamin D (CALCIUM CITRATE +D PO) Take 600 mg by mouth daily.    . Cholecalciferol (VITAMIN D) 50 MCG (2000 UT) CAPS Take 2,000 Units by mouth daily.    . cyclobenzaprine (FLEXERIL) 10 MG tablet TAKE 1/2 TABLET (5MG  TOTAL)3 TIMES A DAY AS NEEDED    FOR MUSCLE SPASMS   . denosumab (PROLIA) 60 MG/ML SOSY injection Inject 60 mg into the skin every 6 (six) months.   . furosemide (LASIX) 40 MG tablet Take 1 tablet (40 mg total) by mouth daily as needed.   . gabapentin (NEURONTIN) 100 MG capsule Take 1 capsule (100 mg total) by mouth 2 (two) times daily. 05/20/2020: Taking QPM  . glucosamine-chondroitin 500-400 MG tablet Take 1 tablet by mouth 3 (three) times daily. 06/16/2020: Reports taking once a day  . glucose blood (ACCU-CHEK AVIVA PLUS) test strip Use to check blood sugar up to 4 times daily   . HYDROcodone-acetaminophen (NORCO/VICODIN) 5-325 MG tablet Norco 5 mg-325 mg tablet  Take 1 tablet twice a day by oral route as needed. 12/18/2019: Never takes more than 1 per day; takes ~3 times per week  . Insulin Glargine (BASAGLAR KWIKPEN) 100 UNIT/ML Inject 6 Units into the skin daily.   . Lancets MISC  Use up to 4 times daily to check blood sugars.   Marland Kitchen losartan (COZAAR) 50 MG tablet TAKE 1 TABLET DAILY   . Melatonin 5 MG CHEW Chew 5 mg by mouth daily.   . potassium chloride (K-DUR) 10 MEQ tablet  11/06/2017: Only takes when she takes furosemide   No facility-administered encounter medications on file as of 07/22/2020.    Patient Active Problem List   Diagnosis Date Noted  . CPAP use counseling 06/01/2020  . Dysuria 03/02/2020  . Edema of lower extremity 11/19/2019  . Hyperlipidemia 11/15/2019  . Low back pain 11/15/2019   . Dry eyes 10/01/2019  . Tick bite 10/01/2019  . Vomiting 10/01/2019  . Personal history of other specified conditions 07/03/2019  . Fall 04/15/2019  . Type 2 diabetes mellitus ([place]) 03/18/2019  . Hypertension 03/18/2019  . Hyperparathyroidism due to renal insufficiency ([place]) 03/18/2019  . Headache disorder 09/10/2018  . Cervico-occipital neuralgia 07/23/2018  . Osteoporosis 07/23/2018  . Cramp and spasm 02/12/2018  . Paresthesia of hand 10/25/2017  . Lesion of rectum 09/07/2017  . Loss of height 08/15/2017  . 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 Cervical spondylosis without myelopathy 08/15/2017  . DDD (degenerative disc disease), cervical 08/15/2017  . Bursitis of hip 08/15/2017  . Female stress incontinence 08/15/2017  . Muscle weakness 08/15/2017  . Neck pain 08/15/2017  . Spondylolisthesis, congenital 08/15/2017  . Radial styloid tenosynovitis 08/15/2017  . Senile osteoporosis 08/15/2017  . Hip pain 08/15/2017  . 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  . Rectal hemorrhage 07/25/2017  . Essential tremor 04/26/2017  . Other fatigue 03/22/2017  . Left ventricular hypertrophy 10/12/2016  . Chronic kidney disease, stage 4 (severe) ([place]) 08/26/2016  . OSA on CPAP 08/26/2016    Conditions to be addressed/monitored:HTN and DMII  Care Plan : Diabetes Type 2 (Adult)  Updates made by Leona Singleton, RN since 07/22/2020 12:00 AM  Problem: Glycemic Management (Diabetes, Type 2)   Priority: Medium  Long-Range Goal: Glycemic Management Optimized   Start Date: 06/16/2020  Expected End Date: 12/13/2020  This Visit's Progress: On track  Priority: Medium  Objective:  Lab Results  Component Value Date   HGBA1C 7.5 (H) 03/23/2020 .   Lab Results  Component Value Date   CREATININE 2.34 (H) 03/23/2020   CREATININE 2.34 (H) 03/02/2020   CREATININE 2.68 (H) 10/20/2019  Current Barriers:  Marland Kitchen Knowledge Deficits related to basic Diabetes pathophysiology and self  care/management as evidenced by continued Hgb A1C above goal.  Reports continued use of her Freestyle Lil[place] continuous monitoring system.  Fasting blood sugar this morning was down to 64, up to 121 after treatment, with recent fasting ranges of 90-120's.  States this mornings hypoglycemia was the first in a long while.  Reports compliance with increase in Victoza dosing per CCM Pharmacist recommendations recently.  Discussed notifying CCM pharmacist if hypoglycemic episodes increase. Case Manager Clinical Goal(s):  Marland Kitchen Patient will demonstrate improved adherence to prescribed treatment plan for diabetes self care/management as evidenced by: Four times a day monitoring and recording of CBG, adherence to ADA/ carb modified diet, exercise at least 3 days/week, adherence to prescribed medication regimen Interventions:  . Collaboration with [name], MD regarding development and update of comprehensive plan of care as evidenced by provider attestation and co-signature . Inter-disciplinary care team collaboration (see longitudinal plan of care) . Reviewed medications with patient and medication changes and discussed importance of medication adherence . Discussed plans with patient for ongoing care management  follow up and provided patient with direct contact information for care management team . 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 Reviewed scheduled/upcoming provider appointments including: upcoming primary care appointment in August 11, 2020  . Advised patient, providing education and rationale, to check cbg at least four times a day and record, calling primary care provider or CCM Pharmacist for findings outside established parameters.   . Discussed food diary and carbohydrate modified diet, encouraged to continue to keep food diary  taking to follow up appointments, encouraged healthier food options and increasing vegetable intake . Encouraged to continue to work with CCM Pharmacist  . Discussed appropriate meal portions and how to use food diary with making good food choices . Encouraged patient to contact CCM Pharmacist for continued hypoglycemic episodes for possible medication adjustments Patient Goals/Self-Care Activities Over the next 30 days, patient will:  . Check blood sugar at prescribed times (at least 4 times a day)  . Check blood sugar if I feel it is too high or too low, calling provider for sustained highs or lows . Enter blood sugar readings and medication or insulin into daily log  . Take the blood sugar log to all doctor visits  . Continue to keep food diary notating what foods elevate blood sugars . Try to increase fruits and vegetables in diet (eat them daily) . Take medication as prescribed . Current Hgb A1C is 7.5 with goal being below 7 Follow Up Plan: The care management team will reach out to the patient again over the next 30 business days.    Care Plan : Hypertension (Adult)  Updates made by Leona Singleton, RN since 07/22/2020 12:00 AM  Problem: Hypertension (Hypertension)   Priority: Medium  Long-Range Goal: Hypertension Monitored   Start Date: 06/16/2020  Expected End Date: 12/13/2020  This Visit's Progress: Not on track  Priority: Medium  Current Barriers:  Marland Kitchen Knowledge Deficits related to basic understanding of hypertension pathophysiology and self care management as evidenced by patient reporting she is monitoring blood pressures a few times a week, not recording pressures and unable to recall recent blood pressures checked.  Continues to report not recording blood pressures when checked at home.  Does report blood pressures have been ranging normal or systolic 440'H.  Acknowledges she needs to write blood pressures down.  Continues to report she feels a lot better and her mood is the best  it has been in a while.  Continues to get exercise walking her dog several times a day. Nurse Case Manager Clinical Goal(s):  Marland Kitchen Over the next 90 days, patient will verbalize understanding of plan for hypertension management . Over the next 90 days, patient will attend all scheduled medical appointments: with Primary Care Provider in April and CCM Pharmacist in March . Over the next 90 days, patient will demonstrate improved adherence to prescribed treatment plan for hypertension as evidenced by taking all medications as prescribed, monitoring and recording blood pressure as directed, adhering to low sodium/DASH diet Interventions:  . Collaboration with [name], MD regarding development and update of comprehensive plan of care as evidenced by provider attestation and co-signature . Inter-disciplinary care team collaboration (see longitudinal plan of care) . Evaluation of current treatment plan related to hypertension self management and patient's adherence to plan as established by provider. . Reviewed medications with  patient and discussed importance of compliance . Discussed plans with patient for ongoing care management follow up and provided patient with direct contact information for care management team . Advised patient, providing education and rationale, to monitor blood pressure daily and record, calling PCP for findings outside established parameters.  . Reviewed scheduled/upcoming provider appointments including: with Primary Care Provider in April 5th   . Confirmed patient has received 2022 [facility] Calendar Booklet to help record vital signs . Instructed and encouraged patient to place Calendar near medications or blood pressure cuff for ease of logging pressures when she takes them . Encouraged to increase activity as tolerated, fall precautions and preventions reviewed and discussed . Reviewed signs and symptoms of hypertension and encouraged patient to continue to self  monitor Patient Goals/Self-Care Activities:  Over the next 30 days, patient will: . Check blood pressure daily . Write blood pressure results in a log or diary and take to medical appointments . Keep log near medications and blood pressure machine to remind yourself to write down vitals in log . Continue to monitor self for signs and symptoms of high blood pressure (swelling in extremities, headache, etc) . Increase activity as tolerated and weather allows Follow Up Plan: The care management team will reach out to the patient again over the next 30 business days.       Plan:The care management team will reach out to the patient again over the next 30 business days.  Hubert Azure RN, MSN RN Care Management Coordinator [place] [phone] [email or web address]

## 2020-07-22 NOTE — Patient Instructions (Signed)
Visit Information  PATIENT GOALS: Goals Addressed            This Visit's Progress   . (CCM RNCM) Monitor and Manage My Blood Sugar   On track    Timeframe:  Long-Range Goal Priority:  Medium Start Date:  06/16/20                           Expected End Date:  12/13/20                     Follow Up Date 08/14/20   . Check blood sugar at prescribed times (at least 4 times a day)  . Check blood sugar if I feel it is too high or too low, calling provider for sustained highs or lows . Enter blood sugar readings and medication or insulin into daily log  . Take the blood sugar log to all doctor visits  . Continue to keep food diary notating what foods elevate blood sugars . Try to increase fruits and vegetables in diet (eat them daily) . Take medication as prescribed . Current Hgb A1C is 7.5 with goal being below 7   Why is this important?   Checking your blood sugar at home helps to keep it from getting very high or very low.  Writing the results in a diary or log helps the doctor know how to care for you.  Your blood sugar log should have the time, date and the results.  Also, write down the amount of insulin or other medicine that you take.  Other information, like what you ate, exercise done and how you were feeling, will also be helpful.      . (CCM RNCM) Track and Manage My Blood Pressure-Hypertension   Not on track    Timeframe:  Long-Range Goal Priority:  Medium Start Date: 06/16/20                            Expected End Date:  12/13/20                     Follow Up Date 08/14/20    . Check blood pressure daily . Write blood pressure results in a log or diary and take to medical appointments . Keep log near medications and blood pressure machine to remind yourself to write down vitals in log . Continue to monitor self for signs and symptoms of high blood pressure (swelling in extremities, headache, etc) . Increase activity as tolerated and weather allows   Why is this important?     You won't feel high blood pressure, but it can still hurt your blood vessels.   High blood pressure can cause heart or kidney problems. It can also cause a stroke.   Making lifestyle changes like losing a little weight or eating less salt will help.   Checking your blood pressure at home and at different times of the day can help to control blood pressure.   If the doctor prescribes medicine remember to take it the way the doctor ordered.   Call the office if you cannot afford the medicine or if there are questions about it.         Patient verbalizes understanding of instructions provided today and agrees to view in [place].   The care management team will reach out to the patient again over the next 30 business  days.   Hubert Azure RN, MSN RN Care Management Coordinator [place] [phone] [email or web address]

## 2020-07-24 ENCOUNTER — Ambulatory Visit: Payer: Medicare Other | Admitting: Pharmacist

## 2020-07-24 DIAGNOSIS — N184 Chronic kidney disease, stage 4 (severe): Secondary | ICD-10-CM

## 2020-07-24 DIAGNOSIS — E1122 Type 2 diabetes mellitus with diabetic chronic kidney disease: Secondary | ICD-10-CM

## 2020-07-24 DIAGNOSIS — I10 Essential (primary) hypertension: Secondary | ICD-10-CM

## 2020-07-24 DIAGNOSIS — E785 Hyperlipidemia, unspecified: Secondary | ICD-10-CM

## 2020-07-24 NOTE — Chronic Care Management (AMB) (Signed)
Chronic Care Management Pharmacy Note  07/24/2020 Name:  Kelly [name] MRN:  [id number] DOB:  Apr 22, 1941  Subjective: Kelly [name] is an 80 y.o. year old female who is a primary patient of Sonnenberg, Angela Adam, MD.  The CCM team was consulted for assistance with disease management and care coordination needs.    Care coordination for medication management in response to provider referral for pharmacy case management and/or care coordination services.   Consent to Services:  The patient was given information about Chronic Care Management services, agreed to services, and gave verbal consent prior to initiation of services.  Please see initial visit note for detailed documentation.   Patient Care Team: [name], MD as PCP - General (Family Medicine) [name], RPH-CPP as Pharmacist (Pharmacist) Murlean Iba, MD (Nephrology) Felipa Furnace, DPM as Consulting Physician (Podiatry) Leona Singleton, RN as Case Manager  Recent office visits: 3/16 - RN CM call  Recent consult visits: None since our last call  Hospital visits: None in previous 6 months  Objective:  Lab Results  Component Value Date   CREATININE 2.34 (H) 03/23/2020   CREATININE 2.34 (H) 03/02/2020   CREATININE 2.68 (H) 10/20/2019    Lab Results  Component Value Date   HGBA1C 7.5 (H) 03/23/2020   Last diabetic Eye exam:  Lab Results  Component Value Date/Time   HMDIABEYEEXA No Retinopathy 12/31/2019 04:30 PM    Last diabetic Foot exam: No results found for: HMDIABFOOTEX      Component Value Date/Time   CHOL 107 04/22/2019 1055   TRIG 168.0 (H) 04/22/2019 1055   HDL 27.40 (L) 04/22/2019 1055   CHOLHDL 4 04/22/2019 1055   VLDL 33.6 04/22/2019 1055   LDLCALC 46 04/22/2019 1055   LDLDIRECT 78.0 02/18/2019 0857    Hepatic Function Latest Ref Rng & Units 04/22/2019 04/11/2019 02/18/2019  Total Protein 6.0 - 8.3 g/dL 6.0 7.1 6.1  Albumin 3.5 - 5.2 g/dL 3.8 3.9 3.8   AST 0 - 37 U/L _0 ALT 0 - 35 U/L 34 21 26  Alk Phosphatase 39 - 117 U/L 79 73 75  Total Bilirubin 0.2 - 1.2 mg/dL 0.6 0.9 0.6  Bilirubin, Direct 0.0 - 0.3 mg/dL - - 0.1    Lab Results  Component Value Date/Time   TSH 3.93 03/22/2017 10:43 AM   TSH 0.719 07/09/2016 03:12 AM    CBC Latest Ref Rng & Units 10/20/2019 04/11/2019 07/17/2018  WBC 4.0 - 10.5 K/uL 9.7 6.4 7.6  Hemoglobin 12.0 - 15.0 g/dL 14.1 14.5 14.0  Hematocrit 36.0 - 46.0 % 43.4 45.3 42.4  Platelets 150 - 400 K/uL 163 191 175.0    Lab Results  Component Value Date/Time   VD25OH 76.94 02/18/2019 08:57 AM   VD25OH 111.67 (HH) 12/24/2018 11:15 AM    Clinical ASCVD: No    Social History   Tobacco Use  Smoking Status Never Smoker  Smokeless Tobacco Never Used   BP Readings from Last 3 Encounters:  06/10/20 110/70  06/01/20 113/61  05/13/20 128/76   Pulse Readings from Last 3 Encounters:  06/10/20 88  06/01/20 81  05/13/20 74   Wt Readings from Last 3 Encounters:  06/10/20 123 lb 6.4 oz (56 kg)  06/01/20 125 lb (56.7 kg)  05/13/20 125 lb (56.7 kg)    Assessment: Review of patient past medical history, allergies, medications, health status, including review of consultants reports, laboratory and other test data, was performed as part  of comprehensive evaluation and provision of chronic care management services.   SDOH:  (Social Determinants of Health) assessments and interventions performed:  SDOH Interventions   Flowsheet Row Most Recent Value  SDOH Interventions   Financial Strain Interventions Intervention Not Indicated      CCM Care Plan  Allergies  Allergen Reactions  . Prilosec Otc [Omeprazole Magnesium] Other (See Comments)    Maybe cause of acute interstitial nephritis  . Sucralfate Other (See Comments)    Maybe cause of acute interstitial nephritis  . Amoxicillin Diarrhea  . Morphine And Related Other (See Comments)    Chest pains Chest pains    Medications Reviewed Today     Reviewed by Leona Singleton, RN (Registered Nurse) on 07/22/20 at 1521  Med List Status: <None>  Medication Order Taking? Sig Documenting Provider Last Dose Status Informant  amLODipine (NORVASC) 5 MG tablet [id number]  Take 1 tablet (5 mg total) by mouth daily. [name], MD  Active   aspirin EC 81 MG tablet [id number]  Take 81 mg by mouth daily. [provider]  Active Multiple Informants  atorvastatin (LIPITOR) 80 MG tablet [id number]  TAKE 1 TABLET DAILY [name], MD  Active   bisacodyl (DULCOLAX) 5 MG EC tablet [id number]  Take 5 mg by mouth daily as needed for moderate constipation. [provider]  Active   calcitRIOL (ROCALTROL) 0.25 MCG capsule [id number]  Take 0.25 mcg by mouth daily. [provider]  Active   Calcium Citrate-Vitamin D (CALCIUM CITRATE +D PO) [id number]  Take 600 mg by mouth daily.  [provider]  Active   Cholecalciferol (VITAMIN D) 50 MCG (2000 UT) CAPS [id number]  Take 2,000 Units by mouth daily.  [provider]  Active   Continuous Blood Gluc Sensor (FREESTYLE LIBRE 2 SENSOR) [place] [id number] Yes Use to check sugar at least 4 times daily [name], MD Taking Active   cyclobenzaprine (FLEXERIL) 10 MG tablet [id number]  TAKE 1/2 TABLET (5MG TOTAL)3 TIMES A DAY AS NEEDED    FOR MUSCLE SPASMS [name], MD  Active   denosumab (PROLIA) 60 MG/ML SOSY injection [id number]  Inject 60 mg into the skin every 6 (six) months. [provider]  Active            Med Note [name], [name] May 20, 2020  2:28 PM)    DULoxetine (CYMBALTA) 30 MG capsule [id number] Yes Take 1 capsule (30 mg total) by mouth daily. [name], MD Taking Active   furosemide (LASIX) 40 MG tablet [id number]  Take 1 tablet (40 mg total) by mouth daily as needed. [name], MD  Active   gabapentin (NEURONTIN) 100 MG capsule [id number]  Take 1 capsule (100 mg total) by mouth 2 (two) times daily.  [name], MD  Active            Med Note [name], [name] May 20, 2020  2:27 PM) Taking QPM  glucosamine-chondroitin 500-400 MG tablet [id number]  Take 1 tablet by mouth 3 (three) times daily. [provider]  Active            Med Note [name], [facility] D   Tue Jun 16, 2020 11:13 AM) Reports taking once a day  glucose blood (ACCU-CHEK AVIVA PLUS) test strip [id number]  Use to check blood sugar up to 4 times daily [name], MD  Active   HYDROcodone-acetaminophen (NORCO/VICODIN) 5-325 MG tablet  [id number]  Norco 5 mg-325 mg tablet  Take 1 tablet twice a day by oral route as needed. [provider]  Active            Med Note [name] Dec 18, 2019  3:23 PM) Never takes more than 1 per day; takes ~3 times per week  Insulin Glargine (BASAGLAR KWIKPEN) 100 UNIT/ML [id number]  Inject 6 Units into the skin daily. [name], MD  Active   Lancets MISC [id number]  Use up to 4 times daily to check blood sugars. Coral Spikes, DO  Active   liraglutide (VICTOZA) 18 MG/3ML SOPN [id number] Yes Inject 1.2 mg into the skin daily. [name], MD Taking Active   losartan (COZAAR) 50 MG tablet [id number]  TAKE 1 TABLET DAILY [name], MD  Active   Melatonin 5 MG CHEW [id number]  Chew 5 mg by mouth daily. [provider]  Active   potassium chloride (K-DUR) 10 MEQ tablet [id number]   [provider]  Active            Med Note Thurmond Butts, CAROLINE E   Mon Nov 06, 2017 10:32 AM) Only takes when she takes furosemide          Patient Active Problem List   Diagnosis Date Noted  . CPAP use counseling 06/01/2020  . Dysuria 03/02/2020  . Edema of lower extremity 11/19/2019  . Hyperlipidemia 11/15/2019  . Low back pain 11/15/2019  . Dry eyes 10/01/2019  . Tick bite 10/01/2019  . Vomiting 10/01/2019  . Personal history of other specified conditions 07/03/2019  . Fall 04/15/2019  . Type 2 diabetes mellitus ([place])  03/18/2019  . Hypertension 03/18/2019  . Hyperparathyroidism due to renal insufficiency ([place]) 03/18/2019  . Headache disorder 09/10/2018  . Cervico-occipital neuralgia 07/23/2018  . Osteoporosis 07/23/2018  . Cramp and spasm 02/12/2018  . Paresthesia of hand 10/25/2017  . Lesion of rectum 09/07/2017  . Loss of height 08/15/2017  . 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 Cervical spondylosis without myelopathy 08/15/2017  . DDD (degenerative disc disease), cervical 08/15/2017  . Bursitis of hip 08/15/2017  . Female stress incontinence 08/15/2017  . Muscle weakness 08/15/2017  . Neck pain 08/15/2017  . Spondylolisthesis, congenital 08/15/2017  . Radial styloid tenosynovitis 08/15/2017  . Senile osteoporosis 08/15/2017  . Hip pain 08/15/2017  . 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  . Rectal hemorrhage 07/25/2017  . Essential tremor 04/26/2017  . Other fatigue 03/22/2017  . Left ventricular hypertrophy 10/12/2016  . Chronic kidney disease, stage 4 (severe) ([place]) 08/26/2016  . OSA on CPAP 08/26/2016    Immunization History  Administered Date(s) Administered  . Fluad Quad(high Dose 65+) 02/25/2019, 03/02/2020  . Influenza, High Dose Seasonal PF 02/24/2017, 02/28/2018  . Influenza,inj,quad, With Preservative 02/19/2016  . Moderna Sars-Covid-2 Vaccination 07/05/2019, 08/06/2019, 03/02/2020  . Td 02/26/2019  . Zoster Recombinat (Shingrix) 03/10/2020    Conditions to be addressed/monitored: HTN, DMII and Depression  Care Plan : Medication Management  Updates made by [name], RPH-CPP since 07/24/2020 12:00 AM    Problem: Diabetes, CKD, HTN, HLD     Long-Range Goal: Disease Progression Prevention   This Visit's Progress: On track  Recent Progress: On track  Priority: High  Note:   Current Barriers:   Unable to achieve control of diabetes   Unable to independently control mental health diagnoses   Pharmacist Clinical Goal(s):   Over the next 90  days, patient  will achieve adherence to monitoring guidelines and medication adherence to achieve therapeutic efficacy.  Over the next 90 days, patient will a achieve control of diabetes as evidenced by A1c through collaboration with PharmD and provider.    Interventions:  1:1 collaboration with [name], MD regarding development and update of comprehensive plan of care as evidenced by provider attestation and co-signature  Inter-disciplinary care team collaboration (see longitudinal plan of care)  Comprehensive medication review performed; medication list updated in electronic medical record   Diabetes: . Uncontrolled; current treatment: Basaglar 8 units daily, Victoza 1.2 mg daily - Hx nausea, vomiting with Ozempic.  Marland Kitchen Received voicemail from patient that she has had a few episodes of fastings hypoglycemia this week, readings in 50-70s. She has held her Nancee Liter a few of these days . Called patient back x 3. Left voicemail advising to HOLD insulin over the weekend.    Hypertension:  Controlled per last clinic BP; current treatment: amlodipine 5 mg daily, losartan 50 mg daily  Furosemide 40 mg + potassium 10 mEq PRN lower extremity edema (and "fluid eye bags"); has not used frequently  Continue collaboration w/ RN CM for disease management education   Hyperlipidemia, ASCVD risk reduction  Controlled per last lipid panel; current treatment: atorvastatin 80 mg daily, ezetimibe 10 mg daily;   Antiplatelet regimen: aspirin 81 mg daily  Previously recommended continue current regimen   Depression, anxiety:  Improved but patient denies full control; notes that she feels "more like myself", but still not as happy sometimes as she thinks she should; current regimen: duloxetine 30 mg daily  Previously recommended to continue current regimen at this time. Encouraged to discuss potential for dose increase at her next appointment w/ PCP in a few weeks   Osteoporosis w/  hypocalcemia:  Appropriately treated w/ Prolia Q6 months; last dose 03/2020; will be due again in May  Supplements: calcium 1200 mg + Vitamin D 1000 units daily; calcitriol 0.25 mcg daily per nephrology   Previously recommended to continue current regimen at this time   Chronic Pain - Degenerative Disc Disease, Arthritis:  Well controlled at this time; current regimen: gabapentin 100 mg BID, but notes that she is only taking 1 dose QPM. Denies any sedation or next day drowsiness from this medication; PRN hydrocodone/APAP 5/325 mg PRN- no more than once daily  cyclobenzaprine 10 mg - no more than once weekly; bisacodyl 5 mg daily  Previouusly recommended to continue current regimen at this time. Continue to monitor for increased sedation w/ age, changes in renal function   Patient Goals/Self-Care Activities  Over the next 90 days, patient will:  - take medications as prescribed - check glucose at least QID using continuous glucose monitor, document, and provide at future appointments - target a minimum of 150 minutes of moderate intensity exercise weekly - start keeping a food diary to better monitor impact of dietary choices   Follow Up Plan: Face to Face appointment with care management team member scheduled for:  ~ 10 weeks as previously scheduled      Medication Assistance: None required.  Patient affirms current coverage meets needs.  Patient's preferred pharmacy is:  [facility], [place] to [address] [place] [address] Phone: [phone] Fax: [place], [place] - [place] [address] Phone: [phone] Fax: [phone]  Follow Up:  Patient agrees to Care Plan and Follow-up.  Plan: Telephone follow up appointment with care management team member scheduled for:  ~ 8 weeks  [name], PharmD, [place], [place] Clinical  Pharmacist [employer] [facility] [employer] [phone]

## 2020-07-24 NOTE — Patient Instructions (Signed)
Visit Information  PATIENT GOALS: Goals Addressed              This Visit's Progress     Patient Stated   .  PharmD - Patient Self Monitoring (pt-stated)        Patient Goals/Self-Care Activities . Over the next 90 days, patient will:  - take medications as prescribed - check glucose at least three times daily using continuous glucose monitor, document, and provide at future appointments - target a minimum of 150 minutes of moderate intensity exercise weekly - start keeping a food diary to better monitor impact of dietary choices        Patient verbalizes understanding of instructions provided today and agrees to view in [place].   Plan: Telephone follow up appointment with care management team member scheduled for:  ~ 8 weeks  [name], PharmD, [place], [place] Clinical Pharmacist [employer] at [employer] [phone]

## 2020-08-07 ENCOUNTER — Other Ambulatory Visit: Payer: Self-pay

## 2020-08-11 ENCOUNTER — Other Ambulatory Visit: Payer: Self-pay

## 2020-08-11 ENCOUNTER — Encounter: Payer: Self-pay | Admitting: Family Medicine

## 2020-08-11 ENCOUNTER — Ambulatory Visit (INDEPENDENT_AMBULATORY_CARE_PROVIDER_SITE_OTHER): Payer: Medicare Other | Admitting: Family Medicine

## 2020-08-11 VITALS — BP 118/80 | HR 76 | Temp 97.8°F | Ht 64.0 in | Wt 123.4 lb

## 2020-08-11 DIAGNOSIS — E1122 Type 2 diabetes mellitus with diabetic chronic kidney disease: Secondary | ICD-10-CM | POA: Diagnosis not present

## 2020-08-11 DIAGNOSIS — B351 Tinea unguium: Secondary | ICD-10-CM | POA: Diagnosis not present

## 2020-08-11 DIAGNOSIS — N184 Chronic kidney disease, stage 4 (severe): Secondary | ICD-10-CM

## 2020-08-11 DIAGNOSIS — F418 Other specified anxiety disorders: Secondary | ICD-10-CM

## 2020-08-11 DIAGNOSIS — Z794 Long term (current) use of insulin: Secondary | ICD-10-CM

## 2020-08-11 DIAGNOSIS — G479 Sleep disorder, unspecified: Secondary | ICD-10-CM | POA: Insufficient documentation

## 2020-08-11 DIAGNOSIS — N2581 Secondary hyperparathyroidism of renal origin: Secondary | ICD-10-CM | POA: Diagnosis not present

## 2020-08-11 DIAGNOSIS — J309 Allergic rhinitis, unspecified: Secondary | ICD-10-CM | POA: Diagnosis not present

## 2020-08-11 LAB — BASIC METABOLIC PANEL
BUN: 25 mg/dL — ABNORMAL HIGH (ref 6–23)
CO2: 28 mEq/L (ref 19–32)
Calcium: 9.8 mg/dL (ref 8.4–10.5)
Chloride: 110 mEq/L (ref 96–112)
Creatinine, Ser: 1.85 mg/dL — ABNORMAL HIGH (ref 0.40–1.20)
GFR: 25.66 mL/min — ABNORMAL LOW (ref >60.00)
Glucose, Bld: 190 mg/dL — ABNORMAL HIGH (ref 70–99)
Potassium: 3.4 mEq/L — ABNORMAL LOW (ref 3.5–5.1)
Sodium: 144 mEq/L (ref 135–145)

## 2020-08-11 LAB — HEMOGLOBIN A1C: Hgb A1c MFr Bld: 7.8 % — ABNORMAL HIGH (ref 4.6–6.5)

## 2020-08-11 MED ORDER — LORATADINE 5 MG PO TBDP: 5.0000 mg | ORAL_TABLET | Freq: Every day | ORAL | 3 refills | Status: DC

## 2020-08-11 MED ORDER — CICLOPIROX 8 % EX SOLN
Freq: Every day | CUTANEOUS | 2 refills | Status: DC
Start: 2020-08-11 — End: 2020-12-02

## 2020-08-11 NOTE — Assessment & Plan Note (Signed)
She will trial Claritin 5 mg once daily.

## 2020-08-11 NOTE — Assessment & Plan Note (Signed)
She will continue to see nephrology.  

## 2020-08-11 NOTE — Assessment & Plan Note (Signed)
Improved though does still have some mild symptoms.  Given her kidney function I am getting check with our clinical pharmacist regarding the potential for increasing her Cymbalta.  We will let the patient know when I hear back.  For now she will continue on Cymbalta 30 mg once daily.

## 2020-08-11 NOTE — Progress Notes (Signed)
Tommi Rumps, MD Phone: [phone]  Kelly [name] is a 80 y.o. female who presents today for follow-up.  Anxiety/depression: Patient notes no anxiety.  She does note some depression.  It is better than it was previously.  She not crying as much.  She is taking Cymbalta 30 mg once daily.  No SI.  Diabetes: She is using her freestyle libre and is checking her sugar 3-25 times per day.  She is within target range 68% of the time and is above goal 32% of the time.  She is taking Basaglar 8 units once daily around 2 PM and Victoza 0.6 mg once daily around 2 PM.  She previously had low glucoses into the 50s and 60s when her Victoza was increased to 1.2 mg once daily.  No polyuria or polydipsia.  No hypoglycemia since she has altered her regimen as above.  She notes her diet is not good.  She eats lots of carbohydrates and some fried foods.  She does not get fruits or vegetables daily.  She has cut down on soda.  Allergic rhinitis: Notes this has been bothersome for 3 or so weeks.  She has some rhinorrhea.  Minimal congestion in her nose.  No sneezing.  She is occasionally taking Benadryl for this.  Onychomycosis: Notes thickening of the left index finger nail and one of her toenails.  She has tried over-the-counter treatments with little benefit.  Sleeping trouble: Notes she has trouble falling asleep occasionally.  She generally goes to bed after midnight though sometimes does not fall asleep until 4 AM.  She generally sleeps until 11 AM.  She is not watching TV though does read the new testament in the Bible and her daughter seems to indicate that this is quite stimulating for her.   Social History   Tobacco Use  Smoking Status Never Smoker  Smokeless Tobacco Never Used    Current Outpatient Medications on File Prior to Visit  Medication Sig Dispense Refill  . amLODipine (NORVASC) 5 MG tablet Take 1 tablet (5 mg total) by mouth daily. 90 tablet 3  . aspirin EC 81 MG tablet Take  81 mg by mouth daily.    Marland Kitchen atorvastatin (LIPITOR) 80 MG tablet TAKE 1 TABLET DAILY 90 tablet 1  . bisacodyl (DULCOLAX) 5 MG EC tablet Take 5 mg by mouth daily as needed for moderate constipation.    . calcitRIOL (ROCALTROL) 0.25 MCG capsule Take 0.25 mcg by mouth daily.    . Calcium Citrate-Vitamin D (CALCIUM CITRATE +D PO) Take 600 mg by mouth daily.     . Cholecalciferol (VITAMIN D) 50 MCG (2000 UT) CAPS Take 2,000 Units by mouth daily.     . Continuous Blood Gluc Sensor (FREESTYLE LIBRE 2 SENSOR) MISC Use to check sugar at least 4 times daily 6 each 3  . cyclobenzaprine (FLEXERIL) 10 MG tablet TAKE 1/2 TABLET (5MG  TOTAL)3 TIMES A DAY AS NEEDED    FOR MUSCLE SPASMS 45 tablet 0  . denosumab (PROLIA) 60 MG/ML SOSY injection Inject 60 mg into the skin every 6 (six) months.    . DULoxetine (CYMBALTA) 30 MG capsule Take 1 capsule (30 mg total) by mouth daily. 90 capsule 3  . furosemide (LASIX) 40 MG tablet Take 1 tablet (40 mg total) by mouth daily as needed. 90 tablet 0  . gabapentin (NEURONTIN) 100 MG capsule Take 1 capsule (100 mg total) by mouth 2 (two) times daily. 180 capsule 1  . glucosamine-chondroitin 500-400 MG tablet Take 1  tablet by mouth 3 (three) times daily.    Marland Kitchen glucose blood (ACCU-CHEK AVIVA PLUS) test strip Use to check blood sugar up to 4 times daily 100 each 3  . HYDROcodone-acetaminophen (NORCO/VICODIN) 5-325 MG tablet Norco 5 mg-325 mg tablet  Take 1 tablet twice a day by oral route as needed.    . Insulin Glargine (BASAGLAR KWIKPEN) 100 UNIT/ML Inject 6 Units into the skin daily. 15 mL 3  . Lancets MISC Use up to 4 times daily to check blood sugars. 200 each 11  . liraglutide (VICTOZA) 18 MG/3ML SOPN Inject 1.2 mg into the skin daily. 27 mL 3  . losartan (COZAAR) 50 MG tablet TAKE 1 TABLET DAILY 90 tablet 1  . Melatonin 5 MG CHEW Chew 5 mg by mouth daily.    . potassium chloride (K-DUR) 10 MEQ tablet     . BD PEN NEEDLE NANO U/F 32G X 4 MM MISC SMARTSIG:1 Each SUB-Q Twice  Daily     No current facility-administered medications on file prior to visit.     ROS see history of present illness  Objective  Physical Exam Vitals:   08/11/20 0909  BP: 118/80  Pulse: 76  Temp: 97.8 F (36.6 C)  SpO2: 99%    BP Readings from Last 3 Encounters:  08/11/20 118/80  06/10/20 110/70  06/01/20 113/61   Wt Readings from Last 3 Encounters:  08/11/20 123 lb 6.4 oz (56 kg)  06/10/20 123 lb 6.4 oz (56 kg)  06/01/20 125 lb (56.7 kg)    Physical Exam Constitutional:      General: She is not in acute distress.    Appearance: She is not diaphoretic.  Cardiovascular:     Rate and Rhythm: Normal rate and regular rhythm.     Heart sounds: Normal heart sounds.  Pulmonary:     Effort: Pulmonary effort is normal.     Breath sounds: Normal breath sounds.  Musculoskeletal:     Right lower leg: No edema.     Left lower leg: No edema.  Skin:    General: Skin is warm and dry.     Comments: Thickened left index fingernail towards the tip of her fingernail, similar thickened left second toenail, both of these are very mild  Neurological:     Mental Status: She is alert.      Assessment/Plan: Please see individual problem list.  Problem List Items Addressed This Visit    Allergic rhinitis    She will trial Claritin 5 mg once daily.      Hyperparathyroidism due to renal insufficiency [facility])    She will continue to see nephrology.      Mixed anxiety and depressive disorder    Improved though does still have some mild symptoms.  Given her kidney function I am getting check with our clinical pharmacist regarding the potential for increasing her Cymbalta.  We will let the patient know when I hear back.  For now she will continue on Cymbalta 30 mg once daily.      Onychomycosis    Given the findings are very mild I do not think the benefit of Lamisil outweighs the risk.  Will trial ciclopirox.      Relevant Medications   ciclopirox (PENLAC) 8 % solution    Sleeping difficulty    This is likely related to her sleep schedule.  Discussed that she would have to alter her wake time and try to stay awake during the day to reset her sleep  schedule.  Encouraged her to avoid screen time before bed.  Discussed she may want to pick something else to read that is less stimulating.      Type 2 diabetes mellitus (HCC) - Primary    Check A1c.  She will continue Basaglar 8 units once daily and Victoza 0.6 mg once daily.  Discussed extensive dietary changes.      Relevant Orders   Basic Metabolic Panel (BMET)   HgB A1c      This visit occurred during the SARS-CoV-2 public health emergency.  Safety protocols were in place, including screening questions prior to the visit, additional usage of staff PPE, and extensive cleaning of exam room while observing appropriate contact time as indicated for disinfecting solutions.   I have spent 41 minutes in the care of this patient regarding history taking, documentation, completion of physical exam, discussion of plan, placing orders, and review of freestyle libre data.   Tommi Rumps, MD [place]

## 2020-08-11 NOTE — Assessment & Plan Note (Signed)
Check A1c.  She will continue Basaglar 8 units once daily and Victoza 0.6 mg once daily.  Discussed extensive dietary changes.

## 2020-08-11 NOTE — Assessment & Plan Note (Signed)
This is likely related to her sleep schedule.  Discussed that she would have to alter her wake time and try to stay awake during the day to reset her sleep schedule.  Encouraged her to avoid screen time before bed.  Discussed she may want to pick something else to read that is less stimulating.

## 2020-08-11 NOTE — Patient Instructions (Signed)
Nice to see you. We will try ciclopirox for your fingernail issues. Please start on Claritin. I am going to check with our pharmacist regarding increasing your Cymbalta dose given your kidney function.

## 2020-08-11 NOTE — Assessment & Plan Note (Signed)
Given the findings are very mild I do not think the benefit of Lamisil outweighs the risk.  Will trial ciclopirox.

## 2020-08-14 ENCOUNTER — Telehealth: Payer: Self-pay | Admitting: Family Medicine

## 2020-08-14 ENCOUNTER — Ambulatory Visit (INDEPENDENT_AMBULATORY_CARE_PROVIDER_SITE_OTHER): Payer: Medicare Other | Admitting: *Deleted

## 2020-08-14 DIAGNOSIS — N184 Chronic kidney disease, stage 4 (severe): Secondary | ICD-10-CM

## 2020-08-14 DIAGNOSIS — I10 Essential (primary) hypertension: Secondary | ICD-10-CM | POA: Diagnosis not present

## 2020-08-14 DIAGNOSIS — Z794 Long term (current) use of insulin: Secondary | ICD-10-CM | POA: Diagnosis not present

## 2020-08-14 DIAGNOSIS — E1122 Type 2 diabetes mellitus with diabetic chronic kidney disease: Secondary | ICD-10-CM | POA: Diagnosis not present

## 2020-08-14 MED ORDER — DULOXETINE HCL 40 MG PO CPEP: 40.0000 mg | ORAL_CAPSULE | Freq: Every day | ORAL | 1 refills | Status: DC

## 2020-08-14 NOTE — Patient Instructions (Addendum)
Visit Information  PATIENT GOALS: Goals Addressed            This Visit's Progress   . (CCM RNCM) Monitor and Manage My Blood Sugar   On track    Timeframe:  Long-Range Goal Priority:  Medium Start Date:  06/16/20                           Expected End Date:  12/13/20                     Follow Up Date 09/04/20   . Check blood sugar at prescribed times (at least 4 times a day)  . Check blood sugar if I feel it is too high or too low, calling provider for sustained highs or lows . Enter blood sugar readings and medication/insulin into daily log taking to provider for review . Review Diet education mailed to you and video on MyChart . Continue to keep food diary notating what foods elevate blood sugars . Try to increase fruits and vegetables in diet (eat them daily) . Take medication as prescribed . Current Hgb A1C is increased 7.8 with goal being below 7   Why is this important?   Checking your blood sugar at home helps to keep it from getting very high or very low.  Writing the results in a diary or log helps the doctor know how to care for you.  Your blood sugar log should have the time, date and the results.  Also, write down the amount of insulin or other medicine that you take.  Other information, like what you ate, exercise done and how you were feeling, will also be helpful.      . (CCM RNCM) Track and Manage My Blood Pressure-Hypertension   On track    Timeframe:  Long-Range Goal Priority:  Medium Start Date: 06/16/20                            Expected End Date:  12/13/20                     Follow Up Date 09/04/20    . Check blood pressure at least 3 times a week . Write blood pressure results in a log or diary and take to medical appointments . Keep log near medications and blood pressure machine to remind yourself to write down vitals in log . Continue to monitor self for signs and symptoms of high blood pressure (swelling in extremities, headache, etc) . Increase  activity as tolerated and weather allows   Why is this important?    You won't feel high blood pressure, but it can still hurt your blood vessels.   High blood pressure can cause heart or kidney problems. It can also cause a stroke.   Making lifestyle changes like losing a little weight or eating less salt will help.   Checking your blood pressure at home and at different times of the day can help to control blood pressure.   If the doctor prescribes medicine remember to take it the way the doctor ordered.   Call the office if you cannot afford the medicine or if there are questions about it.         The patient verbalized understanding of instructions, educational materials, and care plan provided today and agreed to receive a mailed copy of patient instructions, educational  materials, and care plan.   The care management team will reach out to the patient again over the next 30 business days.   Hubert Azure RN, MSN RN Care Management Coordinator [place] [phone] [email or web address]   Diabetes Mellitus and Nutrition, Adult When you have diabetes, or diabetes mellitus, it is very important to have healthy eating habits because your blood sugar (glucose) levels are greatly affected by what you eat and drink. Eating healthy foods in the right amounts, at about the same times every day, can help you:  Control your blood glucose.  Lower your risk of heart disease.  Improve your blood pressure.  Reach or maintain a healthy weight. What can affect my meal plan? Every person with diabetes is different, and each person has different needs for a meal plan. Your health care provider may recommend that you work with a dietitian to make a meal plan that is best for you. Your meal plan may vary depending on factors such as:  The calories you need.  The medicines you take.  Your weight.  Your blood glucose, blood pressure, and  cholesterol levels.  Your activity level.  Other health conditions you have, such as heart or kidney disease. How do carbohydrates affect me? Carbohydrates, also called carbs, affect your blood glucose level more than any other type of food. Eating carbs naturally raises the amount of glucose in your blood. Carb counting is a method for keeping track of how many carbs you eat. Counting carbs is important to keep your blood glucose at a healthy level, especially if you use insulin or take certain oral diabetes medicines. It is important to know how many carbs you can safely have in each meal. This is different for every person. Your dietitian can help you calculate how many carbs you should have at each meal and for each snack. How does alcohol affect me? Alcohol can cause a sudden decrease in blood glucose (hypoglycemia), especially if you use insulin or take certain oral diabetes medicines. Hypoglycemia can be a life-threatening condition. Symptoms of hypoglycemia, such as sleepiness, dizziness, and confusion, are similar to symptoms of having too much alcohol.  Do not drink alcohol if: ? Your health care provider tells you not to drink. ? You are pregnant, may be pregnant, or are planning to become pregnant.  If you drink alcohol: ? Do not drink on an empty stomach. ? Limit how much you use to:  0-1 drink a day for women.  0-2 drinks a day for men. ? Be aware of how much alcohol is in your drink. In the [place] one drink equals one 12 oz bottle of beer (355 mL), one 5 oz glass of wine (148 mL), or one 1 oz glass of hard liquor (44 mL). ? Keep yourself hydrated with water, diet soda, or unsweetened iced tea.  Keep in mind that regular soda, juice, and other mixers may contain a lot of sugar and must be counted as carbs. What are tips for following this plan? Reading food labels  Start by checking the serving size on the "Nutrition Facts" label of packaged foods and drinks. The amount of  calories, carbs, fats, and other nutrients listed on the label is based on one serving of the item. Many items contain more than one serving per package.  Check the total grams (g) of carbs in one serving. You can calculate the number of servings of carbs in one serving by dividing the total carbs by 15. For  example, if a food has 30 g of total carbs per serving, it would be equal to 2 servings of carbs.  Check the number of grams (g) of saturated fats and trans fats in one serving. Choose foods that have a low amount or none of these fats.  Check the number of milligrams (mg) of salt (sodium) in one serving. Most people should limit total sodium intake to less than 2,300 mg per day.  Always check the nutrition information of foods labeled as "low-fat" or "nonfat." These foods may be higher in added sugar or refined carbs and should be avoided.  Talk to your dietitian to identify your daily goals for nutrients listed on the label. Shopping  Avoid buying canned, pre-made, or processed foods. These foods tend to be high in fat, sodium, and added sugar.  Shop around the outside edge of the grocery store. This is where you will most often find fresh fruits and vegetables, bulk grains, fresh meats, and fresh dairy. Cooking  Use low-heat cooking methods, such as baking, instead of high-heat cooking methods like deep frying.  Cook using healthy oils, such as olive, canola, or sunflower oil.  Avoid cooking with butter, cream, or high-fat meats. Meal planning  Eat meals and snacks regularly, preferably at the same times every day. Avoid going long periods of time without eating.  Eat foods that are high in fiber, such as fresh fruits, vegetables, beans, and whole grains. Talk with your dietitian about how many servings of carbs you can eat at each meal.  Eat 4-6 oz (112-168 g) of lean protein each day, such as lean meat, chicken, fish, eggs, or tofu. One ounce (oz) of lean protein is equal  to: ? 1 oz (28 g) of meat, chicken, or fish. ? 1 egg. ?  cup (62 g) of tofu.  Eat some foods each day that contain healthy fats, such as avocado, nuts, seeds, and fish.   What foods should I eat? Fruits Berries. Apples. Oranges. Peaches. Apricots. Plums. Grapes. Mango. Papaya. Pomegranate. Kiwi. Cherries. Vegetables Lettuce. Spinach. Leafy greens, including kale, chard, collard greens, and mustard greens. Beets. Cauliflower. Cabbage. Broccoli. Carrots. Green beans. Tomatoes. Peppers. Onions. Cucumbers. Brussels sprouts. Grains Whole grains, such as whole-wheat or whole-grain bread, crackers, tortillas, cereal, and pasta. Unsweetened oatmeal. Quinoa. Brown or wild rice. Meats and other proteins Seafood. Poultry without skin. Lean cuts of poultry and beef. Tofu. Nuts. Seeds. Dairy Low-fat or fat-free dairy products such as milk, yogurt, and cheese. The items listed above may not be a complete list of foods and beverages you can eat. Contact a dietitian for more information. What foods should I avoid? Fruits Fruits canned with syrup. Vegetables Canned vegetables. Frozen vegetables with butter or cream sauce. Grains Refined white flour and flour products such as bread, pasta, snack foods, and cereals. Avoid all processed foods. Meats and other proteins Fatty cuts of meat. Poultry with skin. Breaded or fried meats. Processed meat. Avoid saturated fats. Dairy Full-fat yogurt, cheese, or milk. Beverages Sweetened drinks, such as soda or iced tea. The items listed above may not be a complete list of foods and beverages you should avoid. Contact a dietitian for more information. Questions to ask a health care provider  Do I need to meet with a diabetes educator?  Do I need to meet with a dietitian?  What number can I call if I have questions?  When are the best times to check my blood glucose? Where to find more information:  American Diabetes Association: diabetes.org  Academy  of Nutrition and Dietetics: www.eatright.[employer] of Diabetes and Digestive and Kidney Diseases: [employer]  Association of Diabetes Care and Education Specialists: www.diabeteseducator.org Summary  It is important to have healthy eating habits because your blood sugar (glucose) levels are greatly affected by what you eat and drink.  A healthy meal plan will help you control your blood glucose and maintain a healthy lifestyle.  Your health care provider may recommend that you work with a dietitian to make a meal plan that is best for you.  Keep in mind that carbohydrates (carbs) and alcohol have immediate effects on your blood glucose levels. It is important to count carbs and to use alcohol carefully. This information is not intended to replace advice given to you by your health care provider. Make sure you discuss any questions you have with your health care provider. Document Revised: 04/02/2019 Document Reviewed: 04/02/2019 Elsevier Patient Education  2021 [employer].  https://www.diabeteseducator.org/docs/default-source/living-with-diabetes/conquering-the-grocery-store-v1.pdf?sfvrsn=4">  Carbohydrate Counting for Diabetes Mellitus, Adult Carbohydrate counting is a method of keeping track of how many carbohydrates you eat. Eating carbohydrates naturally increases the amount of sugar (glucose) in the blood. Counting how many carbohydrates you eat improves your blood glucose control, which helps you manage your diabetes. It is important to know how many carbohydrates you can safely have in each meal. This is different for every person. A dietitian can help you make a meal plan and calculate how many carbohydrates you should have at each meal and snack. What foods contain carbohydrates? Carbohydrates are found in the following foods:  Grains, such as breads and cereals.  Dried beans and soy products.  Starchy vegetables, such as potatoes, peas, and  corn.  Fruit and fruit juices.  Milk and yogurt.  Sweets and snack foods, such as cake, cookies, candy, chips, and soft drinks.   How do I count carbohydrates in foods? There are two ways to count carbohydrates in food. You can read food labels or learn standard serving sizes of foods. You can use either of the methods or a combination of both. Using the Nutrition Facts label The Nutrition Facts list is included on the labels of almost all packaged foods and beverages in the [place] It includes:  The serving size.  Information about nutrients in each serving, including the grams (g) of carbohydrate per serving. To use the Nutrition Facts:  Decide how many servings you will have.  Multiply the number of servings by the number of carbohydrates per serving.  The resulting number is the total amount of carbohydrates that you will be having. Learning the standard serving sizes of foods When you eat carbohydrate foods that are not packaged or do not include Nutrition Facts on the label, you need to measure the servings in order to count the amount of carbohydrates.  Measure the foods that you will eat with a food scale or measuring cup, if needed.  Decide how many standard-size servings you will eat.  Multiply the number of servings by 15. For foods that contain carbohydrates, one serving equals 15 g of carbohydrates. ? For example, if you eat 2 cups or 10 oz (300 g) of strawberries, you will have eaten 2 servings and 30 g of carbohydrates (2 servings x 15 g = 30 g).  For foods that have more than one food mixed, such as soups and casseroles, you must count the carbohydrates in each food that is included. The following list contains standard serving sizes of  common carbohydrate-rich foods. Each of these servings has about 15 g of carbohydrates:  1 slice of bread.  1 six-inch (15 cm) tortilla.  ? cup or 2 oz (53 g) cooked rice or pasta.   cup or 3 oz (85 g) cooked or canned, drained  and rinsed beans or lentils.   cup or 3 oz (85 g) starchy vegetable, such as peas, corn, or squash.   cup or 4 oz (120 g) hot cereal.   cup or 3 oz (85 g) boiled or mashed potatoes, or  or 3 oz (85 g) of a large baked potato.   cup or 4 fl oz (118 mL) fruit juice.  1 cup or 8 fl oz (237 mL) milk.  1 small or 4 oz (106 g) apple.   or 2 oz (63 g) of a medium banana.  1 cup or 5 oz (150 g) strawberries.  3 cups or 1 oz (24 g) popped popcorn. What is an example of carbohydrate counting? To calculate the number of carbohydrates in this sample meal, follow the steps shown below. Sample meal  3 oz (85 g) chicken breast.  ? cup or 4 oz (106 g) brown rice.   cup or 3 oz (85 g) corn.  1 cup or 8 fl oz (237 mL) milk.  1 cup or 5 oz (150 g) strawberries with sugar-free whipped topping. Carbohydrate calculation 1. Identify the foods that contain carbohydrates: ? Rice. ? Corn. ? Milk. ? Strawberries. 2. Calculate how many servings you have of each food: ? 2 servings rice. ? 1 serving corn. ? 1 serving milk. ? 1 serving strawberries. 3. Multiply each number of servings by 15 g: ? 2 servings rice x 15 g = 30 g. ? 1 serving corn x 15 g = 15 g. ? 1 serving milk x 15 g = 15 g. ? 1 serving strawberries x 15 g = 15 g. 4. Add together all of the amounts to find the total grams of carbohydrates eaten: ? 30 g + 15 g + 15 g + 15 g = 75 g of carbohydrates total. What are tips for following this plan? Shopping  Develop a meal plan and then make a shopping list.  Buy fresh and frozen vegetables, fresh and frozen fruit, dairy, eggs, beans, lentils, and whole grains.  Look at food labels. Choose foods that have more fiber and less sugar.  Avoid processed foods and foods with added sugars. Meal planning  Aim to have the same amount of carbohydrates at each meal and for each snack time.  Plan to have regular, balanced meals and snacks. Where to find more  information  American Diabetes Association: www.diabetes.org  Centers for Disease Control and Prevention: http://www.wolf.info/ Summary  Carbohydrate counting is a method of keeping track of how many carbohydrates you eat.  Eating carbohydrates naturally increases the amount of sugar (glucose) in the blood.  Counting how many carbohydrates you eat improves your blood glucose control, which helps you manage your diabetes.  A dietitian can help you make a meal plan and calculate how many carbohydrates you should have at each meal and snack. This information is not intended to replace advice given to you by your health care provider. Make sure you discuss any questions you have with your health care provider. Document Revised: 04/25/2019 Document Reviewed: 04/26/2019 Elsevier Patient Education  2021 [employer].

## 2020-08-14 NOTE — Telephone Encounter (Signed)
Please let the patient know I spoke with the pharmacist and we are going to increase her Cymbalta to 40 mg once daily.  If she notices any drowsiness, stomach upset, or elevated blood pressures with this increase she needs to let us know right away.

## 2020-08-14 NOTE — Chronic Care Management (AMB) (Signed)
Chronic Care Management   CCM RN Visit Note  08/14/2020 Name: Kelly [name] MRN: [id number] DOB: 09/21/1940  Subjective: Kelly [name] is a 80 y.o. year old female who is a primary care patient of Caryl Bis, Angela Adam, MD. The care management team was consulted for assistance with disease management and care coordination needs.    Engaged with patient by telephone for follow up visit in response to provider referral for case management and/or care coordination services.   Consent to Services:  The patient was given information about Chronic Care Management services, agreed to services, and gave verbal consent prior to initiation of services.  Please see initial visit note for detailed documentation.   Patient agreed to services and verbal consent obtained.   Assessment: Review of patient past medical history, allergies, medications, health status, including review of consultants reports, laboratory and other test data, was performed as part of comprehensive evaluation and provision of chronic care management services.   SDOH (Social Determinants of Health) assessments and interventions performed:    CCM Care Plan  Allergies  Allergen Reactions  . Prilosec Otc [Omeprazole Magnesium] Other (See Comments)    Maybe cause of acute interstitial nephritis  . Sucralfate Other (See Comments)    Maybe cause of acute interstitial nephritis  . Amoxicillin Diarrhea  . Morphine And Related Other (See Comments)    Chest pains Chest pains    Outpatient Encounter Medications as of 08/14/2020  Medication Sig Note  . ciclopirox (PENLAC) 8 % solution Apply topically at bedtime. Apply over nail and surrounding skin. Apply daily over previous coat. After seven (7) days, may remove with alcohol and continue cycle.   . Insulin Glargine (BASAGLAR KWIKPEN) 100 UNIT/ML Inject 6 Units into the skin daily. 08/14/2020: Reports taking 8 units daily  . liraglutide (VICTOZA) 18 MG/3ML SOPN Inject 1.2  mg into the skin daily. 08/14/2020: Reports taking 0.6 mg daily  . loratadine (CLARITIN) 10 MG tablet Take by mouth.   Marland Kitchen amLODipine (NORVASC) 5 MG tablet Take 1 tablet (5 mg total) by mouth daily.   Marland Kitchen aspirin EC 81 MG tablet Take 81 mg by mouth daily.   Marland Kitchen atorvastatin (LIPITOR) 80 MG tablet TAKE 1 TABLET DAILY   . BD PEN NEEDLE NANO U/F 32G X 4 MM MISC SMARTSIG:1 Each SUB-Q Twice Daily   . bisacodyl (DULCOLAX) 5 MG EC tablet Take 5 mg by mouth daily as needed for moderate constipation.   . calcitRIOL (ROCALTROL) 0.25 MCG capsule Take 0.25 mcg by mouth daily.   . Calcium Citrate-Vitamin D (CALCIUM CITRATE +D PO) Take 600 mg by mouth daily.    . Cholecalciferol (VITAMIN D) 50 MCG (2000 UT) CAPS Take 2,000 Units by mouth daily.    . Continuous Blood Gluc Sensor (FREESTYLE LIBRE 2 SENSOR) MISC Use to check sugar at least 4 times daily   . cyclobenzaprine (FLEXERIL) 10 MG tablet TAKE 1/2 TABLET (5MG  TOTAL)3 TIMES A DAY AS NEEDED    FOR MUSCLE SPASMS   . denosumab (PROLIA) 60 MG/ML SOSY injection Inject 60 mg into the skin every 6 (six) months.   . DULoxetine (CYMBALTA) 30 MG capsule Take 1 capsule (30 mg total) by mouth daily.   . furosemide (LASIX) 40 MG tablet Take 1 tablet (40 mg total) by mouth daily as needed.   . gabapentin (NEURONTIN) 100 MG capsule Take 1 capsule (100 mg total) by mouth 2 (two) times daily. 05/20/2020: Taking QPM  . glucosamine-chondroitin 500-400 MG tablet Take 1 tablet  by mouth 3 (three) times daily. 06/16/2020: Reports taking once a day  . glucose blood (ACCU-CHEK AVIVA PLUS) test strip Use to check blood sugar up to 4 times daily   . HYDROcodone-acetaminophen (NORCO/VICODIN) 5-325 MG tablet Norco 5 mg-325 mg tablet  Take 1 tablet twice a day by oral route as needed. 12/18/2019: Never takes more than 1 per day; takes ~3 times per week  . Lancets MISC Use up to 4 times daily to check blood sugars.   . Loratadine 5 MG TBDP Take 1 tablet (5 mg total) by mouth daily.   Marland Kitchen losartan  (COZAAR) 50 MG tablet TAKE 1 TABLET DAILY   . Melatonin 5 MG CHEW Chew 5 mg by mouth daily.   . potassium chloride (K-DUR) 10 MEQ tablet  11/06/2017: Only takes when she takes furosemide   No facility-administered encounter medications on file as of 08/14/2020.    Patient Active Problem List   Diagnosis Date Noted  . Onychomycosis 08/11/2020  . Sleeping difficulty 08/11/2020  . Allergic rhinitis 08/11/2020  . CPAP use counseling 06/01/2020  . Dysuria 03/02/2020  . Edema of lower extremity 11/19/2019  . Hyperlipidemia 11/15/2019  . Low back pain 11/15/2019  . Dry eyes 10/01/2019  . Tick bite 10/01/2019  . Vomiting 10/01/2019  . Personal history of other specified conditions 07/03/2019  . Fall 04/15/2019  . Type 2 diabetes mellitus ([place]) 03/18/2019  . Hypertension 03/18/2019  . Hyperparathyroidism due to renal insufficiency ([place]) 03/18/2019  . Headache disorder 09/10/2018  . Cervico-occipital neuralgia 07/23/2018  . Osteoporosis 07/23/2018  . Cramp and spasm 02/12/2018  . Paresthesia of hand 10/25/2017  . Lesion of rectum 09/07/2017  . Loss of height 08/15/2017  . 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 Cervical spondylosis without myelopathy 08/15/2017  . DDD (degenerative disc disease), cervical 08/15/2017  . Bursitis of hip 08/15/2017  . Female stress incontinence 08/15/2017  . Muscle weakness 08/15/2017  . Neck pain 08/15/2017  . Spondylolisthesis, congenital 08/15/2017  . Radial styloid tenosynovitis 08/15/2017  . Senile osteoporosis 08/15/2017  . Hip pain 08/15/2017  . 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  . Rectal hemorrhage 07/25/2017  . Essential tremor 04/26/2017  . Other fatigue 03/22/2017  . Left ventricular hypertrophy 10/12/2016  . Chronic kidney disease, stage 4 (severe) ([place]) 08/26/2016  . OSA on CPAP 08/26/2016    Conditions to be addressed/monitored:HTN and DMII  Care Plan : Diabetes Type 2 (Adult)  Updates made by Leona Singleton, RN  since 08/14/2020 12:00 AM  Problem: Glycemic Management (Diabetes, Type 2)   Priority: Medium  Long-Range Goal: Glycemic Management Optimized   Start Date: 06/16/2020  Expected End Date: 12/13/2020  This Visit's Progress: On track  Recent Progress: On track  Priority: Medium  Objective:  Lab Results  Component Value Date   HGBA1C 7.8 (H) 08/11/2020  Current Barriers:  Marland Kitchen Knowledge Deficits related to basic Diabetes pathophysiology and self care/management as evidenced by increasing Hgb A1C.  Reports continued use of her Freestyle Lilbre continuous monitoring system.  Self adjusted medications after hypoglycemia las month.  Is now taking Basaglar 8 units daily and Victoza 0.6 units daily.  Fasting blood sugar this morning was 100, with recent fasting ranges of 100-150's.  Denies any hypoglycemia since adjusting medications, but does report elevations as high as 304 after eating pasta for lunch.  Encouraged patient to contact CCM Pharmacist for sustained elevations. Case Manager Clinical Goal(s):  Marland Kitchen Patient will demonstrate improved adherence to prescribed treatment plan for  diabetes self care/management as evidenced by: Four times a day monitoring and recording of CBG, adherence to ADA/ carb modified diet, exercise at least 3 days/week, adherence to prescribed medication regimen Interventions:  . Collaboration with [name], MD regarding development and update of comprehensive plan of care as evidenced by provider attestation and co-signature . Inter-disciplinary care team collaboration (see longitudinal plan of care) . Reviewed medications with patient and medication changes and discussed importance of medication adherence . Discussed plans with patient for ongoing care management follow up and provided patient with direct contact information for care management team . 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 Reviewed scheduled/upcoming provider appointments including: upcoming appointment with Nephrologist next week . Encouraged patient to discuss renal diet with nephrologist . Advised patient, providing education and rationale, to check cbg at least four times a day and record, calling primary care provider or CCM Pharmacist for findings outside established parameters.   . Discussed food diary and carbohydrate modified diet, encouraged to continue to keep food diary taking to follow up appointments, encouraged healthier food options and increasing vegetable intake . Discussed carbohydrate counting and diabetic diet, sending EMMI Diabetes Diet Video . Sending Diabetes Diet and Carbohydrate Counting handouts . Encouraged to continue to work with CCM Pharmacist  . Discussed appropriate meal portions and how to use food diary with making good food choices . Discussed increasing Hgb A1C and ways to help reduce; encouraged patient to discuss with primary care provider goal A1C . Encouraged patient to contact CCM Pharmacist for sustained hyperglycemic episodes for possible medication adjustments Patient Goals/Self-Care Activities Over the next 30 days, patient will:  . Check blood sugar at prescribed times (at least 4 times a day)  . Check blood sugar if I feel it is too high or too low, calling provider for sustained highs or lows . Enter blood sugar readings and medication/insulin into daily log taking to provider for review . Review Diet education mailed to you and video on MyChart . Continue to keep food diary notating what foods elevate blood sugars . Try to increase fruits and vegetables in diet (eat them daily) . Take medication as prescribed . Current Hgb A1C is increased 7.8 with goal being below 7 Follow Up Plan: The care management team will reach out to the patient again over the next 30 business days.     Care Plan : Hypertension (Adult)  Updates made by Leona Singleton, RN since 08/14/2020 12:00 AM  Problem: Hypertension (Hypertension)   Priority: Medium  Long-Range Goal: Hypertension Monitored   Start Date: 06/16/2020  Expected End Date: 12/13/2020  This Visit's Progress: On track  Recent Progress: Not on track  Priority: Medium  Current Barriers:  Marland Kitchen Knowledge Deficits related to basic understanding of hypertension pathophysiology and self care management; has been monitoring blood pressures at home.  Ranges have been 120-130/70-80's.  Denies any hypertensive episodes and signs symptoms related to hypertension.  Continues to report she feels better and her mood is better, but she is wondering if her Cymbalta dose can be increased (primary aware).  Continues to get exercise walking her dog several times a day. Nurse Case Manager Clinical Goal(s):  Marland Kitchen Over the next 90 days, patient will verbalize understanding of plan for hypertension management . Over the next 90 days, patient will attend  all scheduled medical appointments: with Primary Care Provider in April and CCM Pharmacist in March . Over the next 90 days, patient will demonstrate improved adherence to prescribed treatment plan for hypertension as evidenced by taking all medications as prescribed, monitoring and recording blood pressure as directed, adhering to low sodium/DASH diet Interventions:  . Collaboration with [name], MD regarding development and update of comprehensive plan of care as evidenced by provider attestation and co-signature . Inter-disciplinary care team collaboration (see longitudinal plan of care) . Evaluation of current treatment plan related to hypertension self management and patient's adherence to plan as established by provider. . Reviewed medications with patient and discussed importance of compliance . Discussed plans with patient for ongoing care management follow up and provided patient with direct  contact information for care management team . Advised patient, providing education and rationale, to monitor blood pressure daily and record, calling PCP for findings outside established parameters.  . Reviewed scheduled/upcoming provider appointments including: with Primary Care Provider 09/29/20 . Discussed sleep and sleep pattern and encouraged patient to stay awake most of the day, avoid cell phone/screen time at bedtime, trying to decrease stimuli at bedtime . Instructed and encouraged patient to place Calendar near medications or blood pressure cuff for ease of logging pressures when she takes them . Encouraged to increase activity as tolerated, fall precautions and preventions reviewed and discussed . Reviewed signs and symptoms of hypertension and encouraged patient to continue to self monitor Patient Goals/Self-Care Activities:  Over the next 30 days, patient will: . Check blood pressure at least 3 times a week . Write blood pressure results in a log or diary and take to medical appointments . Keep log near medications and blood pressure machine to remind yourself to write down vitals in log . Continue to monitor self for signs and symptoms of high blood pressure (swelling in extremities, headache, etc) . Increase activity as tolerated and weather allows Follow Up Plan: The care management team will reach out to the patient again over the next 30 business days.       Plan:The care management team will reach out to the patient again over the next 30 business days.  Hubert Azure RN, MSN RN Care Management Coordinator [place] [phone] [email or web address]

## 2020-08-14 NOTE — Telephone Encounter (Signed)
I called the patient and informed her that the provider was increasing her Cymbalta to 40 mg a day and he sent it to the pharmacy and she understood.  Kelly [name],cma

## 2020-09-04 ENCOUNTER — Ambulatory Visit: Payer: Medicare Other | Admitting: *Deleted

## 2020-09-04 DIAGNOSIS — N184 Chronic kidney disease, stage 4 (severe): Secondary | ICD-10-CM | POA: Diagnosis not present

## 2020-09-04 DIAGNOSIS — I10 Essential (primary) hypertension: Secondary | ICD-10-CM | POA: Diagnosis not present

## 2020-09-04 DIAGNOSIS — Z794 Long term (current) use of insulin: Secondary | ICD-10-CM | POA: Diagnosis not present

## 2020-09-04 DIAGNOSIS — E1122 Type 2 diabetes mellitus with diabetic chronic kidney disease: Secondary | ICD-10-CM

## 2020-09-04 NOTE — Chronic Care Management (AMB) (Signed)
  Chronic Care Management   Outreach Note  09/04/2020 Name: Kelly [name] MRN: [id number] DOB: 04-Nov-1940  Referred by: [name], MD Reason for referral : Chronic Care Management (DM, HTN)   Successful contact was made with patient for follow up.  Patient stating she was preparing to leave the home for an appointment; requesting call back another day and time.  Follow Up Plan: The care management team will reach out to the patient again over the next 14 business days per patients request.  Hubert Azure RN, MSN RN Care Management Coordinator [place] [phone] [email or web address]

## 2020-09-09 DIAGNOSIS — N184 Chronic kidney disease, stage 4 (severe): Secondary | ICD-10-CM | POA: Diagnosis not present

## 2020-09-09 DIAGNOSIS — N2581 Secondary hyperparathyroidism of renal origin: Secondary | ICD-10-CM | POA: Diagnosis not present

## 2020-09-09 DIAGNOSIS — R600 Localized edema: Secondary | ICD-10-CM | POA: Diagnosis not present

## 2020-09-09 DIAGNOSIS — E1122 Type 2 diabetes mellitus with diabetic chronic kidney disease: Secondary | ICD-10-CM | POA: Diagnosis not present

## 2020-09-14 ENCOUNTER — Telehealth: Payer: Self-pay

## 2020-09-14 DIAGNOSIS — L259 Unspecified contact dermatitis, unspecified cause: Secondary | ICD-10-CM | POA: Diagnosis not present

## 2020-09-14 NOTE — Telephone Encounter (Signed)
Pt called and states that she is breaking out in "Itchy welps" on her outter right thigh, right foot, neck line, chest, left upper arm. Pt thought it may be from the generic brand of claritin but pt has been taking that for a few weeks now.Transferred to Shanon Brow at [facility] to triage and let them know that we have no appts avail today at time of call.

## 2020-09-14 NOTE — Telephone Encounter (Signed)
Noted. Please follow-up with the patient to make sure she was evaluated. Thanks.

## 2020-09-14 NOTE — Telephone Encounter (Signed)
[facility] going to [facility].     [facility] TELEPHONE ADVICE RECORD [facility] Patient Name: Kelly [name] D Gender: Female DOB: 1940-08-10 Age: 80 Y 3 M 22 D Return Phone Number: [phone] (Primary), [phone] (Secondary) Address: City/ State/ Zip: [name] [address] Client [name] Client Site [place] - Day Physician Tommi Rumps - MD Contact Type Call Who Is Calling Patient / Member / Family / Caregiver Call Type Triage / Clinical Relationship To Patient Self Return Phone Number [phone] (Primary) Chief Complaint Allergic reaction (unknown symptoms) Reason for Call Symptomatic / Request for [place] states she is breaking out in itchy welts all over, unsure of cause, transferred from office for triage due to no appts available today Translation No Nurse Assessment Nurse: Zorita Pang, RN, Neoma Laming Date/Time (Eastern Time): 09/14/2020 1:17:16 PM Confirm and document reason for call. If symptomatic, describe symptoms. ---Caller states that she has a raised welts all over that itch. She states that she has been taking loratadine 5 mg orally. The doctor told her not to take. Does the patient have any new or worsening symptoms? ---Yes Will a triage be completed? ---Yes Related visit to physician within the last 2 weeks? ---Yes Does the PT have any chronic conditions? (i.e. diabetes, asthma, this includes High risk factors for pregnancy, etc.) ---Yes List chronic conditions. ---stage 4 kidney disease, diabetes Is this a behavioral health or substance abuse call? ---No Guidelines Guideline Title Affirmed Question Affirmed Notes Nurse Date/Time (Eastern Time) Rash or Redness - Widespread SEVERE itching (i.e., interferes with sleep, normal activities or school) Zorita Pang, RN, Neoma Laming 09/14/2020 1:20:48  PM Disp. Time Eilene Ghazi Time) Disposition Final User PLEASE NOTE: All timestamps contained within this report are represented as [place] Standard Time. CONFIDENTIALTY NOTICE: This fax transmission is intended only for the addressee. It contains information that is legally privileged, confidential or otherwise protected from use or disclosure. If you are not the intended recipient, you are strictly prohibited from reviewing, disclosing, copying using or disseminating any of this information or taking any action in reliance on or regarding this information. If you have received this fax in error, please notify us immediately by telephone so that we can arrange for its return to [place]. Phone: [phone], Toll-Free: [phone], Fax: [phone] Page: 2 of 2 Call Id: [id number] 09/14/2020 1:31:57 PM See PCP within 24 Hours Yes Zorita Pang, RN, Garrel Ridgel Disagree/Comply Comply Caller Understands Yes PreDisposition Call Doctor Care Advice Given Per Guideline SEE PCP WITHIN 24 HOURS: CALL BACK IF: Comments User: Marquis Buggy, RN Date/Time (Eastern Time): 09/14/2020 1:32:57 PM Called the back line and no availability within the next 24 hours. The caller was instructed that she can go to UC and there is one nearby that is [facility] in [place] and the number was provided to the caller. Referrals GO TO FACILITY UNDECIDED

## 2020-09-15 DIAGNOSIS — E1122 Type 2 diabetes mellitus with diabetic chronic kidney disease: Secondary | ICD-10-CM | POA: Diagnosis not present

## 2020-09-15 DIAGNOSIS — R600 Localized edema: Secondary | ICD-10-CM | POA: Diagnosis not present

## 2020-09-15 DIAGNOSIS — N2581 Secondary hyperparathyroidism of renal origin: Secondary | ICD-10-CM | POA: Diagnosis not present

## 2020-09-15 DIAGNOSIS — N184 Chronic kidney disease, stage 4 (severe): Secondary | ICD-10-CM | POA: Diagnosis not present

## 2020-09-15 DIAGNOSIS — I10 Essential (primary) hypertension: Secondary | ICD-10-CM | POA: Diagnosis not present

## 2020-09-15 NOTE — Telephone Encounter (Signed)
Patient stated she did go be evaluated, she was given medication for her sx. Another rash broke out on her face lastnight.

## 2020-09-15 NOTE — Telephone Encounter (Signed)
Noted.  If this is not improving with the medication that they prescribed she should be reevaluated.

## 2020-09-16 ENCOUNTER — Ambulatory Visit (INDEPENDENT_AMBULATORY_CARE_PROVIDER_SITE_OTHER): Payer: Medicare Other | Admitting: *Deleted

## 2020-09-16 DIAGNOSIS — Z794 Long term (current) use of insulin: Secondary | ICD-10-CM | POA: Diagnosis not present

## 2020-09-16 DIAGNOSIS — E1122 Type 2 diabetes mellitus with diabetic chronic kidney disease: Secondary | ICD-10-CM | POA: Diagnosis not present

## 2020-09-16 DIAGNOSIS — N184 Chronic kidney disease, stage 4 (severe): Secondary | ICD-10-CM

## 2020-09-16 DIAGNOSIS — I10 Essential (primary) hypertension: Secondary | ICD-10-CM

## 2020-09-16 NOTE — Patient Instructions (Signed)
Visit Information  PATIENT GOALS: Goals Addressed            This Visit's Progress   . (CCM RNCM) Monitor and Manage My Blood Sugar   On track    Timeframe:  Long-Range Goal Priority:  Medium Start Date:  06/16/20                           Expected End Date:  12/13/20                     Follow Up Date 10/21/20   . Check blood sugar at prescribed times (at least 4 times a day)  . Check blood sugar if I feel it is too high or too low, calling provider for sustained highs or lows . Enter blood sugar readings and medication/insulin into daily log taking to provider for review . Continue to keep food diary notating what foods elevate blood sugars . Try to increase fruits and vegetables in diet (eat them daily) . Take medication as prescribed . Current Hgb A1C is increased 7.8 with goal being below 7   Why is this important?   Checking your blood sugar at home helps to keep it from getting very high or very low.  Writing the results in a diary or log helps the doctor know how to care for you.  Your blood sugar log should have the time, date and the results.  Also, write down the amount of insulin or other medicine that you take.  Other information, like what you ate, exercise done and how you were feeling, will also be helpful.      . (CCM RNCM) Track and Manage My Blood Pressure-Hypertension   On track    Timeframe:  Long-Range Goal Priority:  Medium Start Date: 06/16/20                            Expected End Date:  12/13/20                     Follow Up Date 10/21/20    . Check blood pressure at least 3 times a week . Write blood pressure results in a log or diary and take to medical appointments . Keep log near medications and blood pressure machine to remind yourself to write down vitals in log . Continue to monitor self for signs and symptoms of high blood pressure (swelling in extremities, headache, etc) . Increase activity as tolerated and weather allows   Why is this  important?    You won't feel high blood pressure, but it can still hurt your blood vessels.   High blood pressure can cause heart or kidney problems. It can also cause a stroke.   Making lifestyle changes like losing a little weight or eating less salt will help.   Checking your blood pressure at home and at different times of the day can help to control blood pressure.   If the doctor prescribes medicine remember to take it the way the doctor ordered.   Call the office if you cannot afford the medicine or if there are questions about it.         Patient verbalizes understanding of instructions provided today and agrees to view in [place].   The care management team will reach out to the patient again over the next 45 business days.   Kelly [name]  Kelly Mattocks RN, MSN RN Care Management Coordinator [place] [phone] [name]

## 2020-09-16 NOTE — Chronic Care Management (AMB) (Signed)
Chronic Care Management   CCM RN Visit Note  09/16/2020 Name: Kelly [name] MRN: [id number] DOB: 19-Sep-1940  Subjective: Kelly [name] REASONS is a 80 y.o. year old female who is a primary care patient of Caryl Bis, Angela Adam, MD. The care management team was consulted for assistance with disease management and care coordination needs.    Engaged with patient by telephone for follow up visit in response to provider referral for case management and/or care coordination services.   Consent to Services:  The patient was given information about Chronic Care Management services, agreed to services, and gave verbal consent prior to initiation of services.  Please see initial visit note for detailed documentation.   Patient agreed to services and verbal consent obtained.   Assessment: Review of patient past medical history, allergies, medications, health status, including review of consultants reports, laboratory and other test data, was performed as part of comprehensive evaluation and provision of chronic care management services.   SDOH (Social Determinants of Health) assessments and interventions performed:    CCM Care Plan  Allergies  Allergen Reactions  . Prilosec Otc [Omeprazole Magnesium] Other (See Comments)    Maybe cause of acute interstitial nephritis  . Sucralfate Other (See Comments)    Maybe cause of acute interstitial nephritis  . Amoxicillin Diarrhea  . Morphine And Related Other (See Comments)    Chest pains Chest pains    Outpatient Encounter Medications as of 09/16/2020  Medication Sig Note  . furosemide (LASIX) 40 MG tablet Take 1 tablet (40 mg total) by mouth daily as needed.   . Insulin Glargine (BASAGLAR KWIKPEN) 100 UNIT/ML Inject 6 Units into the skin daily. 08/14/2020: Reports taking 8 units daily  . liraglutide (VICTOZA) 18 MG/3ML SOPN Inject 1.2 mg into the skin daily. 08/14/2020: Reports taking 0.6 mg daily  . amLODipine (NORVASC) 5 MG tablet Take 1  tablet (5 mg total) by mouth daily.   Marland Kitchen aspirin EC 81 MG tablet Take 81 mg by mouth daily.   Marland Kitchen atorvastatin (LIPITOR) 80 MG tablet TAKE 1 TABLET DAILY   . BD PEN NEEDLE NANO U/F 32G X 4 MM MISC SMARTSIG:1 Each SUB-Q Twice Daily   . bisacodyl (DULCOLAX) 5 MG EC tablet Take 5 mg by mouth daily as needed for moderate constipation.   . calcitRIOL (ROCALTROL) 0.25 MCG capsule Take 0.25 mcg by mouth daily.   . Calcium Citrate-Vitamin D (CALCIUM CITRATE +D PO) Take 600 mg by mouth daily.    . Cholecalciferol (VITAMIN D) 50 MCG (2000 UT) CAPS Take 2,000 Units by mouth daily.    . ciclopirox (PENLAC) 8 % solution Apply topically at bedtime. Apply over nail and surrounding skin. Apply daily over previous coat. After seven (7) days, may remove with alcohol and continue cycle.   . Continuous Blood Gluc Sensor (FREESTYLE LIBRE 2 SENSOR) MISC Use to check sugar at least 4 times daily   . cyclobenzaprine (FLEXERIL) 10 MG tablet TAKE 1/2 TABLET (5MG  TOTAL)3 TIMES A DAY AS NEEDED    FOR MUSCLE SPASMS   . denosumab (PROLIA) 60 MG/ML SOSY injection Inject 60 mg into the skin every 6 (six) months.   . DULoxetine 40 MG CPEP Take 40 mg by mouth daily.   Marland Kitchen gabapentin (NEURONTIN) 100 MG capsule Take 1 capsule (100 mg total) by mouth 2 (two) times daily. 05/20/2020: Taking QPM  . glucosamine-chondroitin 500-400 MG tablet Take 1 tablet by mouth 3 (three) times daily. 06/16/2020: Reports taking once a day  . glucose  blood (ACCU-CHEK AVIVA PLUS) test strip Use to check blood sugar up to 4 times daily   . HYDROcodone-acetaminophen (NORCO/VICODIN) 5-325 MG tablet Norco 5 mg-325 mg tablet  Take 1 tablet twice a day by oral route as needed. 12/18/2019: Never takes more than 1 per day; takes ~3 times per week  . Lancets MISC Use up to 4 times daily to check blood sugars.   Marland Kitchen loratadine (CLARITIN) 10 MG tablet Take by mouth.   . Loratadine 5 MG TBDP Take 1 tablet (5 mg total) by mouth daily.   Marland Kitchen losartan (COZAAR) 50 MG tablet TAKE 1  TABLET DAILY   . Melatonin 5 MG CHEW Chew 5 mg by mouth daily.   . potassium chloride (K-DUR) 10 MEQ tablet  11/06/2017: Only takes when she takes furosemide   No facility-administered encounter medications on file as of 09/16/2020.    Patient Active Problem List   Diagnosis Date Noted  . Onychomycosis 08/11/2020  . Sleeping difficulty 08/11/2020  . Allergic rhinitis 08/11/2020  . CPAP use counseling 06/01/2020  . Dysuria 03/02/2020  . Edema of lower extremity 11/19/2019  . Hyperlipidemia 11/15/2019  . Low back pain 11/15/2019  . Dry eyes 10/01/2019  . Tick bite 10/01/2019  . Vomiting 10/01/2019  . Personal history of other specified conditions 07/03/2019  . Fall 04/15/2019  . Type 2 diabetes mellitus ([place]) 03/18/2019  . Hypertension 03/18/2019  . Hyperparathyroidism due to renal insufficiency ([place]) 03/18/2019  . Headache disorder 09/10/2018  . Cervico-occipital neuralgia 07/23/2018  . Osteoporosis 07/23/2018  . Cramp and spasm 02/12/2018  . Paresthesia of hand 10/25/2017  . Lesion of rectum 09/07/2017  . Loss of height 08/15/2017  . 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 Cervical spondylosis without myelopathy 08/15/2017  . DDD (degenerative disc disease), cervical 08/15/2017  . Bursitis of hip 08/15/2017  . Female stress incontinence 08/15/2017  . Muscle weakness 08/15/2017  . Neck pain 08/15/2017  . Spondylolisthesis, congenital 08/15/2017  . Radial styloid tenosynovitis 08/15/2017  . Senile osteoporosis 08/15/2017  . Hip pain 08/15/2017  . 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  . Rectal hemorrhage 07/25/2017  . Essential tremor 04/26/2017  . Other fatigue 03/22/2017  . Left ventricular hypertrophy 10/12/2016  . Chronic kidney disease, stage 4 (severe) ([place]) 08/26/2016  . OSA on CPAP 08/26/2016    Conditions to be addressed/monitored:HTN and DMII  Care Plan : Diabetes Type 2 (Adult)  Updates made by Leona Singleton, RN since 09/16/2020 12:00 AM     Problem: Glycemic Management (Diabetes, Type 2)   Priority: Medium    Long-Range Goal: Patient will report decrease in Hgb A1C by 0.2 points within the next 90 days   Start Date: 06/16/2020  Expected End Date: 12/13/2020  This Visit's Progress: On track  Recent Progress: On track  Priority: Medium  Note:   Objective:  Lab Results  Component Value Date   HGBA1C 7.8 (H) 08/11/2020  Current Barriers:  Marland Kitchen Knowledge Deficits related to basic Diabetes pathophysiology and self care/management as evidenced by increasing Hgb A1C.  Reports continued use of her Freestyle Lilbre continuous monitoring system.  Fasting blood sugar this morning was 129 with recent fasting ranges of 100-120's.  Continues to report increasing ranges as the day goes.  Discussed diet and encouraged monitoring carbohydrate intake and reviewing diet education previously sent.  Patient reporting she has developed itchy rash all over in various spots on her body.  Went to Urgent Care and was given steroid cream and is  taking an antihistamine; states rash is drying and clearing. Case Manager Clinical Goal(s):  Marland Kitchen Patient will demonstrate improved adherence to prescribed treatment plan for diabetes self care/management as evidenced by: Four times a day monitoring and recording of CBG, adherence to ADA/ carb modified diet, exercise at least 3 days/week, adherence to prescribed medication regimen Interventions:  . Collaboration with [name], MD regarding development and update of comprehensive plan of care as evidenced by provider attestation and co-signature . Inter-disciplinary care team collaboration (see longitudinal plan of care) . Reviewed medications with patient and medication changes and discussed importance of medication adherence . Discussed plans with patient for ongoing care management follow up and provided patient with direct contact information for care management team . 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 Reviewed scheduled/upcoming provider appointments including: upcoming appointment with Nephrologist next week . Encouraged patient to discuss renal diet with nephrologist . Advised patient, providing education and rationale, to check cbg at least four times a day and record, calling primary care provider or CCM Pharmacist for findings outside established parameters.   . Discussed food diary and carbohydrate modified diet, encouraged to continue to keep food diary taking to follow up appointments, encouraged healthier food options and increasing vegetable intake . Discussed carbohydrate counting and diabetic diet, sent EMMI Diabetes Diet Video; encouraged to view . Sent Diabetes Diet and Carbohydrate Counting handouts; encouraged to review . Encouraged to continue to work with CCM Pharmacist and to contact CCM Pharmacist for sustained hyperglycemic episodes for possible medication adjustments . Discussed appropriate meal portions and how to use food diary with making good food choices . Discussed increasing Hgb A1C and ways to help reduce . Discussed body rash and treatment and encouraged patient to contact provider if rash dose not clear Patient Goals/Self-Care Activities Over the next 45 days, patient will:  . Check blood sugar at prescribed times (at least 4 times a day)  . Check blood sugar if I feel it is too high or too low, calling provider for sustained highs or lows . Enter blood sugar readings and medication/insulin into daily log taking to provider for review . Continue to keep food diary notating what foods elevate blood sugars . Try to increase fruits and vegetables in diet (eat them daily) . Take medication as prescribed . Current Hgb A1C is increased 7.8 with goal being below 7 Follow Up Plan: The care management team  will reach out to the patient again over the next 45 business days.     Care Plan : Hypertension (Adult)  Updates made by Leona Singleton, RN since 09/16/2020 12:00 AM    Problem: Hypertension (Hypertension)   Priority: Medium    Long-Range Goal: Hypertension Monitored   Start Date: 06/16/2020  Expected End Date: 12/13/2020  This Visit's Progress: On track  Recent Progress: On track  Priority: Medium  Note:   Current Barriers:  Marland Kitchen Knowledge Deficits related to basic understanding of hypertension pathophysiology and self care management; has been monitoring blood pressures at home.  Ranges have been 120/80's.  Denies any hypertensive episodes or signs symptoms related to hypertension.  Continues to report she feels better and her mood is better, with her increase in Cymbalta.  Patient reporting she is even becoming more interactive with friends.  Continues to get exercise walking her dog  several times a day. Nurse Case Manager Clinical Goal(s):  Marland Kitchen Over the next 90 days, patient will verbalize understanding of plan for hypertension management . Over the next 90 days, patient will attend all scheduled medical appointments: with Primary Care Provider in April and CCM Pharmacist in March . Over the next 90 days, patient will demonstrate improved adherence to prescribed treatment plan for hypertension as evidenced by taking all medications as prescribed, monitoring and recording blood pressure as directed, adhering to low sodium/DASH diet Interventions:  . Collaboration with [name], MD regarding development and update of comprehensive plan of care as evidenced by provider attestation and co-signature . Inter-disciplinary care team collaboration (see longitudinal plan of care) . Evaluation of current treatment plan related to hypertension self management and patient's adherence to plan as established by provider. . Reviewed medications with patient and discussed importance of  compliance . Discussed plans with patient for ongoing care management follow up and provided patient with direct contact information for care management team . Advised patient, providing education and rationale, to monitor blood pressure daily and record, calling PCP for findings outside established parameters.  . Reviewed scheduled/upcoming provider appointments including: with Primary Care Provider 09/29/20 . Discussed sleep and sleep pattern and encouraged patient to stay awake most of the day, avoid cell phone/screen time at bedtime, trying to decrease stimuli at bedtime . Instructed and encouraged patient to place Calendar near medications or blood pressure cuff for ease of logging pressures when she takes them . Encouraged to increase activity as tolerated, fall precautions and preventions reviewed and discussed . Reviewed signs and symptoms of hypertension and encouraged patient to continue to self monitor . Again discussed and encouraged to use 2022 [facility] Calendar Book to log vital signs for provider review Patient Goals/Self-Care Activities:  Over the next 45 days, patient will: . Check blood pressure at least 3 times a week . Write blood pressure results in a log or diary and take to medical appointments . Keep log near medications and blood pressure machine to remind yourself to write down vitals in log . Continue to monitor self for signs and symptoms of high blood pressure (swelling in extremities, headache, etc) . Increase activity as tolerated and weather allows Follow Up Plan: The care management team will reach out to the patient again over the next 45 business days.       Plan:The care management team will reach out to the patient again over the next 45 business days.  Hubert Azure RN, MSN RN Care Management Coordinator [place] [phone] [email or web address]

## 2020-09-22 ENCOUNTER — Other Ambulatory Visit: Payer: Self-pay

## 2020-09-29 ENCOUNTER — Encounter: Payer: Self-pay | Admitting: Family Medicine

## 2020-09-29 ENCOUNTER — Ambulatory Visit (INDEPENDENT_AMBULATORY_CARE_PROVIDER_SITE_OTHER): Payer: Medicare Other | Admitting: Family Medicine

## 2020-09-29 ENCOUNTER — Telehealth: Payer: Self-pay

## 2020-09-29 ENCOUNTER — Other Ambulatory Visit: Payer: Self-pay

## 2020-09-29 VITALS — BP 122/70 | HR 86 | Temp 97.9°F | Ht 64.0 in | Wt 128.8 lb

## 2020-09-29 DIAGNOSIS — Z794 Long term (current) use of insulin: Secondary | ICD-10-CM | POA: Diagnosis not present

## 2020-09-29 DIAGNOSIS — L989 Disorder of the skin and subcutaneous tissue, unspecified: Secondary | ICD-10-CM | POA: Insufficient documentation

## 2020-09-29 DIAGNOSIS — G479 Sleep disorder, unspecified: Secondary | ICD-10-CM

## 2020-09-29 DIAGNOSIS — R21 Rash and other nonspecific skin eruption: Secondary | ICD-10-CM

## 2020-09-29 DIAGNOSIS — E1122 Type 2 diabetes mellitus with diabetic chronic kidney disease: Secondary | ICD-10-CM | POA: Diagnosis not present

## 2020-09-29 DIAGNOSIS — F418 Other specified anxiety disorders: Secondary | ICD-10-CM | POA: Diagnosis not present

## 2020-09-29 DIAGNOSIS — M810 Age-related osteoporosis without current pathological fracture: Secondary | ICD-10-CM | POA: Diagnosis not present

## 2020-09-29 DIAGNOSIS — N184 Chronic kidney disease, stage 4 (severe): Secondary | ICD-10-CM | POA: Diagnosis not present

## 2020-09-29 MED ORDER — DENOSUMAB 60 MG/ML ~~LOC~~ SOSY
60.0000 mg | PREFILLED_SYRINGE | Freq: Once | SUBCUTANEOUS | Status: AC
  Administered 2020-09-29: 60 mg via SUBCUTANEOUS

## 2020-09-29 MED ORDER — BASAGLAR KWIKPEN 100 UNIT/ML ~~LOC~~ SOPN: 8.0000 [IU] | PEN_INJECTOR | Freq: Every day | SUBCUTANEOUS | 3 refills | Status: DC

## 2020-09-29 NOTE — Assessment & Plan Note (Signed)
The patient will contact us later today and let us know what steroid cream she has at home.  I will then send an alternative in for her.  She will stop the antihistamine and see if that is contributing to her rash.

## 2020-09-29 NOTE — Assessment & Plan Note (Signed)
Prolia given today. 

## 2020-09-29 NOTE — Assessment & Plan Note (Signed)
Much improved.  She will continue Cymbalta 40 mg once daily.

## 2020-09-29 NOTE — Patient Instructions (Signed)
Nice to see. Please let me know what the steroid cream is that she have at home.

## 2020-09-29 NOTE — Assessment & Plan Note (Signed)
Discussed having a more relaxed goal for her A1c.  She will continue Basaglar 8 units once daily and Victoza 1.2 mg once daily.  Discussed dietary changes with her as this is driving her elevated sugars.

## 2020-09-29 NOTE — Assessment & Plan Note (Signed)
This is driven by the patient's sleep schedule being off.  Discussed that the only way to alter this would be to wake up earlier and try to purposefully change her sleep schedule.

## 2020-09-29 NOTE — Telephone Encounter (Signed)
Pt called and wanted to let Dr Caryl Bis know the name of the medication that they discussed today. It is Clobetasol propionate usp 0.05%.

## 2020-09-29 NOTE — Progress Notes (Signed)
Kelly Rumps, MD Phone: [phone]  Kelly [name] is a 80 y.o. female who presents today for f/u.  Anxiety: The patient denies any anxiety symptoms.  No crying episodes.  She is Cymbalta 40 mg once daily.  Sleeping difficulty: Patient notes this is related to her sleep schedule being off.  She stays up late and sleeps in late.  Rash: Patient notes this started a little more than a week ago.  Started on the back of her neck and then also on her foot and thigh on the right side.  Then on her right hand.  She was seen in urgent care and they prescribed a topical steroid as well as an antihistamine.  The patient wonders if it was related to an antihistamine as she read Claritin could cause hives.  She notes no soap or detergent changes.  No travel.  No contacts with this.  Diabetes: Patient and her daughter both note the patient has been drinking more soda recently.  Her freestyle Elenor Legato reveals that she is above target range 41% of the time.  She has been checking her glucose 3-11 times per day.  She has continued on Basaglar 8 units daily and Victoza 1.2 mg daily.  She has had issues with her sugar bottoming out in the past with higher insulin doses and when she has changed her diet.  Social History   Tobacco Use  Smoking Status Never Smoker  Smokeless Tobacco Never Used    Current Outpatient Medications on File Prior to Visit  Medication Sig Dispense Refill  . amLODipine (NORVASC) 5 MG tablet Take 1 tablet (5 mg total) by mouth daily. 90 tablet 3  . aspirin EC 81 MG tablet Take 81 mg by mouth daily.    Marland Kitchen atorvastatin (LIPITOR) 80 MG tablet TAKE 1 TABLET DAILY 90 tablet 1  . BD PEN NEEDLE NANO U/F 32G X 4 MM MISC SMARTSIG:1 Each SUB-Q Twice Daily    . bisacodyl (DULCOLAX) 5 MG EC tablet Take 5 mg by mouth daily as needed for moderate constipation.    . calcitRIOL (ROCALTROL) 0.25 MCG capsule Take 0.25 mcg by mouth daily.    . Calcium Citrate-Vitamin D (CALCIUM CITRATE +D PO)  Take 600 mg by mouth daily.     . Cholecalciferol (VITAMIN D) 50 MCG (2000 UT) CAPS Take 2,000 Units by mouth daily.     . ciclopirox (PENLAC) 8 % solution Apply topically at bedtime. Apply over nail and surrounding skin. Apply daily over previous coat. After seven (7) days, may remove with alcohol and continue cycle. 6.6 mL 2  . clobetasol cream (TEMOVATE) 0.05 % SMARTSIG:1 Topical Daily PRN    . Continuous Blood Gluc Sensor (FREESTYLE LIBRE 2 SENSOR) MISC Use to check sugar at least 4 times daily 6 each 3  . cyclobenzaprine (FLEXERIL) 10 MG tablet TAKE 1/2 TABLET (5MG  TOTAL)3 TIMES A DAY AS NEEDED    FOR MUSCLE SPASMS 45 tablet 0  . denosumab (PROLIA) 60 MG/ML SOSY injection Inject 60 mg into the skin every 6 (six) months.    . DULoxetine 40 MG CPEP Take 40 mg by mouth daily. 90 capsule 1  . furosemide (LASIX) 40 MG tablet Take 1 tablet (40 mg total) by mouth daily as needed. 90 tablet 0  . gabapentin (NEURONTIN) 100 MG capsule Take 1 capsule (100 mg total) by mouth 2 (two) times daily. 180 capsule 1  . glucosamine-chondroitin 500-400 MG tablet Take 1 tablet by mouth 3 (three) times daily.    Marland Kitchen  glucose blood (ACCU-CHEK AVIVA PLUS) test strip Use to check blood sugar up to 4 times daily 100 each 3  . HYDROcodone-acetaminophen (NORCO/VICODIN) 5-325 MG tablet Norco 5 mg-325 mg tablet  Take 1 tablet twice a day by oral route as needed.    . Lancets MISC Use up to 4 times daily to check blood sugars. 200 each 11  . liraglutide (VICTOZA) 18 MG/3ML SOPN Inject 1.2 mg into the skin daily. 27 mL 3  . loratadine (CLARITIN) 10 MG tablet Take by mouth.    . Loratadine 5 MG TBDP Take 1 tablet (5 mg total) by mouth daily. 30 tablet 3  . losartan (COZAAR) 50 MG tablet TAKE 1 TABLET DAILY 90 tablet 1  . Melatonin 5 MG CHEW Chew 5 mg by mouth daily.    . potassium chloride (K-DUR) 10 MEQ tablet      No current facility-administered medications on file prior to visit.     ROS see history of present  illness  Objective  Physical Exam Vitals:   09/29/20 1245  BP: 122/70  Pulse: 86  Temp: 97.9 F (36.6 C)  SpO2: 98%    BP Readings from Last 3 Encounters:  09/29/20 122/70  08/11/20 118/80  06/10/20 110/70   Wt Readings from Last 3 Encounters:  09/29/20 128 lb 12.8 oz (58.4 kg)  08/11/20 123 lb 6.4 oz (56 kg)  06/10/20 123 lb 6.4 oz (56 kg)    Physical Exam Constitutional:      General: She is not in acute distress.    Appearance: She is not diaphoretic.  Cardiovascular:     Rate and Rhythm: Normal rate and regular rhythm.     Heart sounds: Normal heart sounds.  Pulmonary:     Effort: Pulmonary effort is normal.     Breath sounds: Normal breath sounds.  Skin:    General: Skin is warm and dry.     Comments: Slight erythematous patch on the volar aspect of her wrist and proximal palm of her right hand, excoriations noted  Neurological:     Mental Status: She is alert.      Assessment/Plan: Please see individual problem list.  Problem List Items Addressed This Visit    Type 2 diabetes mellitus ([place])    Discussed having a more relaxed goal for her A1c.  She will continue Basaglar 8 units once daily and Victoza 1.2 mg once daily.  Discussed dietary changes with her as this is driving her elevated sugars.      Relevant Medications   Insulin Glargine (BASAGLAR KWIKPEN) 100 UNIT/ML   Mixed anxiety and depressive disorder    Much improved.  She will continue Cymbalta 40 mg once daily.      Osteoporosis - Primary    Prolia given today.      Relevant Medications   denosumab (PROLIA) injection 60 mg (Start on 09/29/2020  1:15 PM)   Sleeping difficulty    This is driven by the patient's sleep schedule being off.  Discussed that the only way to alter this would be to wake up earlier and try to purposefully change her sleep schedule.      Rash    The patient will contact us later today and let us know what steroid cream she has at home.  I will then send an  alternative in for her.  She will stop the antihistamine and see if that is contributing to her rash.        Return in about  3 months (around 12/30/2020) for Diabetes.  This visit occurred during the SARS-CoV-2 public health emergency.  Safety protocols were in place, including screening questions prior to the visit, additional usage of staff PPE, and extensive cleaning of exam room while observing appropriate contact time as indicated for disinfecting solutions.    Kelly Rumps, MD [place]

## 2020-09-30 DIAGNOSIS — M5032 Other cervical disc degeneration, mid-cervical region, unspecified level: Secondary | ICD-10-CM | POA: Diagnosis not present

## 2020-09-30 DIAGNOSIS — M47812 Spondylosis without myelopathy or radiculopathy, cervical region: Secondary | ICD-10-CM | POA: Diagnosis not present

## 2020-09-30 DIAGNOSIS — E1165 Type 2 diabetes mellitus with hyperglycemia: Secondary | ICD-10-CM | POA: Diagnosis not present

## 2020-09-30 DIAGNOSIS — Q762 Congenital spondylolisthesis: Secondary | ICD-10-CM | POA: Diagnosis not present

## 2020-09-30 DIAGNOSIS — M810 Age-related osteoporosis without current pathological fracture: Secondary | ICD-10-CM | POA: Diagnosis not present

## 2020-09-30 DIAGNOSIS — M25572 Pain in left ankle and joints of left foot: Secondary | ICD-10-CM | POA: Diagnosis not present

## 2020-09-30 DIAGNOSIS — M5136 Other intervertebral disc degeneration, lumbar region: Secondary | ICD-10-CM | POA: Diagnosis not present

## 2020-09-30 DIAGNOSIS — M6281 Muscle weakness (generalized): Secondary | ICD-10-CM | POA: Diagnosis not present

## 2020-09-30 DIAGNOSIS — T2101XA Burn of unspecified degree of chest wall, initial encounter: Secondary | ICD-10-CM | POA: Diagnosis not present

## 2020-09-30 DIAGNOSIS — Z79899 Other long term (current) drug therapy: Secondary | ICD-10-CM | POA: Diagnosis not present

## 2020-09-30 DIAGNOSIS — M7062 Trochanteric bursitis, left hip: Secondary | ICD-10-CM | POA: Diagnosis not present

## 2020-09-30 DIAGNOSIS — M25519 Pain in unspecified shoulder: Secondary | ICD-10-CM | POA: Diagnosis not present

## 2020-09-30 MED ORDER — TRIAMCINOLONE ACETONIDE 0.5 % EX OINT: 1.0000 "application " | TOPICAL_OINTMENT | Freq: Two times a day (BID) | CUTANEOUS | 0 refills | Status: DC

## 2020-09-30 NOTE — Telephone Encounter (Signed)
Pt called back. °

## 2020-09-30 NOTE — Telephone Encounter (Signed)
Triamcinolone ointment sent to the pharmacy to try for her areas of itching and rash.

## 2020-09-30 NOTE — Telephone Encounter (Signed)
I called and informed the patient that her ointment was sent to the pharmacy and she stated she had picked it up.  [name],cma

## 2020-09-30 NOTE — Telephone Encounter (Signed)
LVM for patient to call back.   Shamica Moree,cma  

## 2020-10-05 ENCOUNTER — Other Ambulatory Visit: Payer: Self-pay | Admitting: Family Medicine

## 2020-10-14 ENCOUNTER — Ambulatory Visit (INDEPENDENT_AMBULATORY_CARE_PROVIDER_SITE_OTHER): Payer: Medicare Other | Admitting: Pharmacist

## 2020-10-14 DIAGNOSIS — E785 Hyperlipidemia, unspecified: Secondary | ICD-10-CM

## 2020-10-14 DIAGNOSIS — F418 Other specified anxiety disorders: Secondary | ICD-10-CM

## 2020-10-14 DIAGNOSIS — E1122 Type 2 diabetes mellitus with diabetic chronic kidney disease: Secondary | ICD-10-CM

## 2020-10-14 DIAGNOSIS — I10 Essential (primary) hypertension: Secondary | ICD-10-CM

## 2020-10-14 DIAGNOSIS — N184 Chronic kidney disease, stage 4 (severe): Secondary | ICD-10-CM

## 2020-10-14 MED ORDER — ATORVASTATIN CALCIUM 80 MG PO TABS: 1.0000 | ORAL_TABLET | Freq: Every day | ORAL | 3 refills | Status: DC

## 2020-10-14 NOTE — Patient Instructions (Signed)
Visit Information  PATIENT GOALS: Goals Addressed              This Visit's Progress     Patient Stated   .  PharmD - Patient Self Monitoring (pt-stated)        Patient Goals/Self-Care Activities . Over the next 90 days, patient will:  - take medications as prescribed - check glucose at least three times daily using continuous glucose monitor, document, and provide at future appointments - target a minimum of 150 minutes of moderate intensity exercise weekly - start keeping a food diary to better monitor impact of dietary choices       Patient verbalizes understanding of instructions provided today and agrees to view in [place].   Plan: Telephone follow up appointment with care management team member scheduled for:  ~ 12 weeks  [name], PharmD, [place], [place] Clinical Pharmacist [employer] [facility] [employer] [phone]

## 2020-10-14 NOTE — Chronic Care Management (AMB) (Signed)
Chronic Care Management Pharmacy Note  10/14/2020 Name:  Kelly [name] MRN:  [id number] DOB:  02-26-1941   Subjective: Kelly [name] is an 80 y.o. year old female who is a primary patient of Sonnenberg, Angela Adam, MD.  The CCM team was consulted for assistance with disease management and care coordination needs.    Engaged with patient by telephone for follow up visit in response to provider referral for pharmacy case management and/or care coordination services.   Consent to Services:  The patient was given information about Chronic Care Management services, agreed to services, and gave verbal consent prior to initiation of services.  Please see initial visit note for detailed documentation.   Patient Care Team: [name], MD as PCP - General (Family Medicine) [name], [place] as Pharmacist (Pharmacist) Murlean Iba, MD (Nephrology) Felipa Furnace, DPM as Consulting Physician (Podiatry) Leona Singleton, RN as Case Manager  Recent office visits:  4/5 - PCP -  A1c 7.8, but goal <8%; increase duloxetine to 40 mg daily  4/8 - RN CM   5/11- RN CM  5/24 - PCP - tolerating duloetine 40, continued Victoza 1.2 mg daily, Basaglar 8 units; given Prolia; switched from clobetasol to triamcinolone for rash  Recent consult visits:  5/10 - nephrology Candiss Norse; continue current regimen  Hospital visits: None in previous 6 months  Objective:  Lab Results  Component Value Date   CREATININE 1.85 (H) 08/11/2020   CREATININE 2.34 (H) 03/23/2020   CREATININE 2.34 (H) 03/02/2020    Lab Results  Component Value Date   HGBA1C 7.8 (H) 08/11/2020   Last diabetic Eye exam:  Lab Results  Component Value Date/Time   HMDIABEYEEXA No Retinopathy 12/31/2019 04:30 PM    Last diabetic Foot exam: No results found for: HMDIABFOOTEX      Component Value Date/Time   CHOL 107 04/22/2019 1055   TRIG 168.0 (H) 04/22/2019 1055   HDL 27.40 (L) 04/22/2019 1055    CHOLHDL 4 04/22/2019 1055   VLDL 33.6 04/22/2019 1055   LDLCALC 46 04/22/2019 1055   LDLDIRECT 78.0 02/18/2019 0857    Hepatic Function Latest Ref Rng & Units 04/22/2019 04/11/2019 02/18/2019  Total Protein 6.0 - 8.3 g/dL 6.0 7.1 6.1  Albumin 3.5 - 5.2 g/dL 3.8 3.9 3.8  AST 0 - 37 U/L _0 ALT 0 - 35 U/L 34 21 26  Alk Phosphatase 39 - 117 U/L 79 73 75  Total Bilirubin 0.2 - 1.2 mg/dL 0.6 0.9 0.6  Bilirubin, Direct 0.0 - 0.3 mg/dL - - 0.1    Lab Results  Component Value Date/Time   TSH 3.93 03/22/2017 10:43 AM   TSH 0.719 07/09/2016 03:12 AM    CBC Latest Ref Rng & Units 10/20/2019 04/11/2019 07/17/2018  WBC 4.0 - 10.5 K/uL 9.7 6.4 7.6  Hemoglobin 12.0 - 15.0 g/dL 14.1 14.5 14.0  Hematocrit 36.0 - 46.0 % 43.4 45.3 42.4  Platelets 150 - 400 K/uL 163 191 175.0    Lab Results  Component Value Date/Time   VD25OH 76.94 02/18/2019 08:57 AM   VD25OH 111.67 (HH) 12/24/2018 11:15 AM    Clinical ASCVD: No  The ASCVD Risk score Mikey Bussing DC Jr., et al., 2013) failed to calculate for the following reasons:   The valid total cholesterol range is 130 to 320 mg/dL     Social History   Tobacco Use  Smoking Status Never Smoker  Smokeless Tobacco Never Used   BP Readings  from Last 3 Encounters:  09/29/20 122/70  08/11/20 118/80  06/10/20 110/70   Pulse Readings from Last 3 Encounters:  09/29/20 86  08/11/20 76  06/10/20 88   Wt Readings from Last 3 Encounters:  09/29/20 128 lb 12.8 oz (58.4 kg)  08/11/20 123 lb 6.4 oz (56 kg)  06/10/20 123 lb 6.4 oz (56 kg)    Assessment: Review of patient past medical history, allergies, medications, health status, including review of consultants reports, laboratory and other test data, was performed as part of comprehensive evaluation and provision of chronic care management services.   SDOH:  (Social Determinants of Health) assessments and interventions performed:  SDOH Interventions   Flowsheet Row Most Recent Value  SDOH  Interventions   Financial Strain Interventions Intervention Not Indicated      CCM Care Plan  Allergies  Allergen Reactions  . Prilosec Otc [Omeprazole Magnesium] Other (See Comments)    Maybe cause of acute interstitial nephritis  . Sucralfate Other (See Comments)    Maybe cause of acute interstitial nephritis  . Amoxicillin Diarrhea  . Morphine And Related Other (See Comments)    Chest pains Chest pains    Medications Reviewed Today    Reviewed by [name], RPH-CPP (Pharmacist) on 10/14/20 at 1317  Med List Status: <None>  Medication Order Taking? Sig Documenting Provider Last Dose Status Informant  amLODipine (NORVASC) 5 MG tablet [id number] Yes Take 1 tablet (5 mg total) by mouth daily. [name], MD Taking Active   aspirin EC 81 MG tablet [id number] Yes Take 81 mg by mouth daily. [provider] Taking Active Multiple Informants  atorvastatin (LIPITOR) 80 MG tablet [id number] Yes TAKE 1 TABLET DAILY [name], MD Taking Active   BD PEN NEEDLE NANO U/F 32G X 4 MM MISC [id number] Yes USE DAILY WITH VICTOZA AND [name], MD Taking Active   bisacodyl (DULCOLAX) 5 MG EC tablet [id number] Yes Take 5 mg by mouth daily as needed for moderate constipation. [provider] Taking Active   calcitRIOL (ROCALTROL) 0.25 MCG capsule [id number] Yes Take 0.25 mcg by mouth daily. [provider] Taking Active   Calcium Citrate-Vitamin D (CALCIUM CITRATE +D PO) [id number] Yes Take 600 mg by mouth daily.  [provider] Taking Active   Cholecalciferol (VITAMIN D) 50 MCG (2000 UT) CAPS [id number] Yes Take 2,000 Units by mouth daily.  [provider] Taking Active   ciclopirox (PENLAC) 8 % solution [id number] No Apply topically at bedtime. Apply over nail and surrounding skin. Apply daily over previous coat. After seven (7) days, may remove with alcohol and continue cycle.  Patient not taking: Reported on 10/14/2020    [name], MD Not Taking Active   Continuous Blood Gluc Sensor (FREESTYLE LIBRE 2 SENSOR) [place] [id number] Yes Use to check sugar at least 4 times daily [name], MD Taking Active   cyclobenzaprine (FLEXERIL) 10 MG tablet [id number] Yes TAKE 1/2 TABLET (5MG TOTAL)3 TIMES A DAY AS NEEDED    FOR MUSCLE SPASMS [name], MD Taking Active   denosumab (PROLIA) 60 MG/ML SOSY injection [id number] Yes Inject 60 mg into the skin every 6 (six) months. [provider] Taking Active            Med Note [name] May 20, 2020  2:28 PM)    DULoxetine 40 MG CPEP [id number] Yes Take 40 mg by mouth daily. [name], MD Taking Active  furosemide (LASIX) 40 MG tablet [id number] No Take 1 tablet (40 mg total) by mouth daily as needed.  Patient not taking: Reported on 10/14/2020   [name], MD Not Taking Active   gabapentin (NEURONTIN) 100 MG capsule [id number] Yes Take 1 capsule (100 mg total) by mouth 2 (two) times daily. [name], MD Taking Active            Med Note [name] May 20, 2020  2:27 PM) Taking QPM  glucosamine-chondroitin 500-400 MG tablet [id number] Yes Take 1 tablet by mouth 3 (three) times daily. [provider] Taking Active            Med Note [name], [facility] D   Tue Jun 16, 2020 11:13 AM) Reports taking once a day  glucose blood (ACCU-CHEK AVIVA PLUS) test strip [id number] Yes Use to check blood sugar up to 4 times daily [name], MD Taking Active   HYDROcodone-acetaminophen (NORCO/VICODIN) 5-325 MG tablet [id number] Yes Norco 5 mg-325 mg tablet  Take 1 tablet twice a day by oral route as needed. [provider] Taking Active            Med Note [name]   Wed Oct 14, 2020  1:11 PM)    Insulin Glargine (BASAGLAR KWIKPEN) 100 UNIT/ML [id number] Yes Inject 8 Units into the skin daily. [name], MD Taking Active   Lancets MISC [id number] Yes Use up to 4 times  daily to check blood sugars. Coral Spikes, DO Taking Active   liraglutide (VICTOZA) 18 MG/3ML SOPN [id number] Yes Inject 1.2 mg into the skin daily. [name], MD Taking Active            Med Note [name]   Wed Oct 14, 2020  1:16 PM) 0.6 mg daily  Loratadine 5 MG TBDP [id number] No Take 1 tablet (5 mg total) by mouth daily.  Patient not taking: Reported on 10/14/2020   [name], MD Not Taking Active   losartan (COZAAR) 50 MG tablet [id number] Yes TAKE 1 TABLET DAILY [name], MD Taking Active   Melatonin 5 MG CHEW [id number] Yes Chew 5 mg by mouth daily. [provider] Taking Active   potassium chloride (K-DUR) 10 MEQ tablet [id number] No   Patient not taking: Reported on 10/14/2020   [provider] Not Taking Active            Med Note Thurmond Butts, Johny Drilling   Mon Nov 06, 2017 10:32 AM) Only takes when she takes furosemide  triamcinolone ointment (KENALOG) 0.5 % [id number] Yes Apply 1 application topically 2 (two) times daily. [name], MD Taking Active           Patient Active Problem List   Diagnosis Date Noted  . Rash 09/29/2020  . Onychomycosis 08/11/2020  . Sleeping difficulty 08/11/2020  . Allergic rhinitis 08/11/2020  . CPAP use counseling 06/01/2020  . Dysuria 03/02/2020  . Edema of lower extremity 11/19/2019  . Hyperlipidemia 11/15/2019  . Low back pain 11/15/2019  . Dry eyes 10/01/2019  . Tick bite 10/01/2019  . Vomiting 10/01/2019  . Personal history of other specified conditions 07/03/2019  . Fall 04/15/2019  . Type 2 diabetes mellitus ([place]) 03/18/2019  . Hypertension 03/18/2019  . Hyperparathyroidism due to renal insufficiency ([place]) 03/18/2019  . Headache disorder 09/10/2018  . Cervico-occipital neuralgia 07/23/2018  . Osteoporosis 07/23/2018  . Cramp and spasm 02/12/2018  . Paresthesia  of hand 10/25/2017  . Lesion of rectum 09/07/2017  . Loss of height 08/15/2017  . Acquired hallux rigidus  08/15/2017  . Cervical spondylosis without myelopathy 08/15/2017  . DDD (degenerative disc disease), cervical 08/15/2017  . Bursitis of hip 08/15/2017  . Female stress incontinence 08/15/2017  . Muscle weakness 08/15/2017  . Neck pain 08/15/2017  . Spondylolisthesis, congenital 08/15/2017  . Radial styloid tenosynovitis 08/15/2017  . Senile osteoporosis 08/15/2017  . Hip pain 08/15/2017  . 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  . Rectal hemorrhage 07/25/2017  . Essential tremor 04/26/2017  . Other fatigue 03/22/2017  . Left ventricular hypertrophy 10/12/2016  . Chronic kidney disease, stage 4 (severe) ([place]) 08/26/2016  . OSA on CPAP 08/26/2016    Immunization History  Administered Date(s) Administered  . Fluad Quad(high Dose 65+) 02/25/2019, 03/02/2020  . Influenza, High Dose Seasonal PF 02/24/2017, 02/28/2018  . Influenza,inj,quad, With Preservative 02/19/2016  . Moderna Sars-Covid-2 Vaccination 07/05/2019, 08/06/2019, 03/02/2020  . Td 02/26/2019  . Zoster Recombinat (Shingrix) 03/10/2020    Conditions to be addressed/monitored: HTN, HLD, DMII and CKD  Care Plan : Medication Management  Updates made by [name], RPH-CPP since 10/14/2020 12:00 AM    Problem: Diabetes, CKD, HTN, HLD     Long-Range Goal: Disease Progression Prevention   Start Date: 10/14/2020  This Visit's Progress: On track  Recent Progress: On track  Priority: High  Note:   Current Barriers:   Unable to achieve control of diabetes   Unable to independently control mental health diagnoses   Pharmacist Clinical Goal(s):   Over the next 90 days, patient will achieve adherence to monitoring guidelines and medication adherence to achieve therapeutic efficacy.  Over the next 90 days, patient will a achieve control of diabetes as evidenced by A1c through collaboration with PharmD and provider.    Interventions:  1:1 collaboration with [name], MD regarding development  and update of comprehensive plan of care as evidenced by provider attestation and co-signature  Inter-disciplinary care team collaboration (see longitudinal plan of care)  Comprehensive medication review performed; medication list updated in electronic medical record   Diabetes: . At goal <8% given history of hypoglycemia with dose adjustments; current treatment: Basaglar 8 units daily, Victoza 0.6 mg daily - Hx nausea, vomiting with Ozempic. GI upset with higher dose Victoza.  . Current meal patterns: breakfast: cereal, eggs + bacon; usually PB&J but PB was recalled; 2 cups coffee w/ 1 spoon sugar; supper: spaghetti, lean hamburger meat, baked chicken;  . Current glucose readings: using Libre 2 CGM; squash, tomatoes, cucumbers, carrots, vegetables; salad; cutting back on bread; drinks: soda once in a while, tries to limit to one a day, but stocks her fridge with water; popcorn if she wants a late night snack, thinks she is eating a smaller portion size; is keeping a food diary . Weighing once monthly, 112 Date of Download: last 14 days  % Time CGM is active: 83% Average Glucose: 182 mg/dL - 6a 0 noon: 177; noon - -177, 159, 180, 218 Time in Goal:  - Time in range 70-180: 58% - Time above range: 42% - Time below range: 0% . Continue current regimen, as prior attempts to increase either Victoza or Basaglar resulted in side effects. Continued dietary education efforts. Continue to follow w/ RN CM for disease management support and education.  . Encouraged to continue to keep a food log to evaluate impact of dietary choices.    Hypertension:  Controlled per last clinic  BP; current treatment: amlodipine 5 mg daily, losartan 50 mg daily  Furosemide 40 mg + potassium 10 mEq PRN lower extremity edema (and "fluid eye bags"); has not used recently  Home BP: ~130s/80s  Denies lightheadedness, dizziness  Fill history up to date  Continue current regimen at this time. Continue to follow w/ RN  CM for disease management support and education.    Hyperlipidemia, ASCVD risk reduction  Controlled per last lipid panel; current treatment: atorvastatin 80 mg daily, ezetimibe 10 mg daily;   Antiplatelet regimen: aspirin 81 mg daily  Recommended continue current regimen. Praised for switching to baking chicken instead of frying.    Depression, anxiety:  Improved per patient report; current regimen: duloxetine 40 mg daily  Does note that she had an "episode" yesterday. Had a new fence put up but she didn't like where they put the latch. Got upset when she went to the company to complain. Otherwise feeling good.   Denies any stomach puset since dose increase   Recommended to continue current regimen at this time. E   Osteoporosis w/ hypocalcemia:  Appropriately treated w/ Prolia Q6 months; last dose 09/2020; will be due again in May  Supplements: calcium 1200 mg + Vitamin D 1000 units daily; calcitriol 0.25 mcg daily per nephrology   Recommended to continue current regimen at this time   Chronic Pain - Degenerative Disc Disease, Arthritis:  Well controlled at this time; current regimen: gabapentin 100 mg BID, but notes that she is only taking 1 dose QPM, PRN hydrocodone/APAP 5/325 mg PRN- no more than once daily per pain management; cyclobenzaprine 10 mg - no more than once weekly; bisacodyl 5 mg daily  Reports she has a script for naloxone at home in case she needs this.   Recommended to continue current regimen at this time. Continue to monitor for increased sedation w/ age, changes in renal function   Patient Goals/Self-Care Activities  Over the next 90 days, patient will:  - take medications as prescribed - check glucose at least four times daily using continuous glucose monitor, document, and provide at future appointments - target a minimum of 150 minutes of moderate intensity exercise weekly - start keeping a food diary to better monitor impact of dietary choices    Follow Up Plan: Face to Face appointment with care management team member scheduled for:  ~ 12 weeks as previously scheduled      Medication Assistance: None required.  Patient affirms current coverage meets needs.  Patient's preferred pharmacy is:  [facility], [address] [place] [address] Phone: [phone] Fax: [place], [address] [place] [address] Phone: [phone] Fax: [phone]    Follow Up:  Patient agrees to Care Plan and Follow-up.  Plan: Telephone follow up appointment with care management team member scheduled for:  ~ 12 weeks  [name], PharmD, [place], [place] Clinical Pharmacist [employer] at [employer] [phone]

## 2020-10-21 ENCOUNTER — Ambulatory Visit: Payer: Medicare Other | Admitting: *Deleted

## 2020-10-21 DIAGNOSIS — N184 Chronic kidney disease, stage 4 (severe): Secondary | ICD-10-CM | POA: Diagnosis not present

## 2020-10-21 DIAGNOSIS — Z794 Long term (current) use of insulin: Secondary | ICD-10-CM

## 2020-10-21 DIAGNOSIS — E785 Hyperlipidemia, unspecified: Secondary | ICD-10-CM | POA: Diagnosis not present

## 2020-10-21 DIAGNOSIS — E1122 Type 2 diabetes mellitus with diabetic chronic kidney disease: Secondary | ICD-10-CM

## 2020-10-21 DIAGNOSIS — F418 Other specified anxiety disorders: Secondary | ICD-10-CM | POA: Diagnosis not present

## 2020-10-21 DIAGNOSIS — I10 Essential (primary) hypertension: Secondary | ICD-10-CM

## 2020-10-21 NOTE — Chronic Care Management (AMB) (Signed)
Chronic Care Management   CCM RN Visit Note  10/21/2020 Name: Kelly [name] MRN: [id number] DOB: 1940/05/31  Subjective: Kelly [name] is a 80 y.o. year old female who is a primary care patient of Caryl Bis, Angela Adam, MD. The care management team was consulted for assistance with disease management and care coordination needs.    Engaged with patient by telephone for follow up visit in response to provider referral for case management and/or care coordination services.   Consent to Services:  The patient was given information about Chronic Care Management services, agreed to services, and gave verbal consent prior to initiation of services.  Please see initial visit note for detailed documentation.   Patient agreed to services and verbal consent obtained.   Assessment: Review of patient past medical history, allergies, medications, health status, including review of consultants reports, laboratory and other test data, was performed as part of comprehensive evaluation and provision of chronic care management services.   SDOH (Social Determinants of Health) assessments and interventions performed:    CCM Care Plan  Allergies  Allergen Reactions   Prilosec Otc [Omeprazole Magnesium] Other (See Comments)    Maybe cause of acute interstitial nephritis   Sucralfate Other (See Comments)    Maybe cause of acute interstitial nephritis   Amoxicillin Diarrhea   Morphine And Related Other (See Comments)    Chest pains Chest pains    Outpatient Encounter Medications as of 10/21/2020  Medication Sig Note   Insulin Glargine (BASAGLAR KWIKPEN) 100 UNIT/ML Inject 8 Units into the skin daily.    liraglutide (VICTOZA) 18 MG/3ML SOPN Inject 1.2 mg into the skin daily. 10/14/2020: 0.6 mg daily   amLODipine (NORVASC) 5 MG tablet Take 1 tablet (5 mg total) by mouth daily.    aspirin EC 81 MG tablet Take 81 mg by mouth daily.    atorvastatin (LIPITOR) 80 MG tablet Take 1 tablet (80 mg  total) by mouth daily.    BD PEN NEEDLE NANO U/F 32G X 4 MM MISC USE DAILY WITH VICTOZA AND BASAGLAR    bisacodyl (DULCOLAX) 5 MG EC tablet Take 5 mg by mouth daily as needed for moderate constipation.    calcitRIOL (ROCALTROL) 0.25 MCG capsule Take 0.25 mcg by mouth daily.    Calcium Citrate-Vitamin D (CALCIUM CITRATE +D PO) Take 600 mg by mouth daily.     Cholecalciferol (VITAMIN D) 50 MCG (2000 UT) CAPS Take 2,000 Units by mouth daily.     ciclopirox (PENLAC) 8 % solution Apply topically at bedtime. Apply over nail and surrounding skin. Apply daily over previous coat. After seven (7) days, may remove with alcohol and continue cycle. (Patient not taking: Reported on 10/14/2020)    Continuous Blood Gluc Sensor (FREESTYLE LIBRE 2 SENSOR) MISC Use to check sugar at least 4 times daily    cyclobenzaprine (FLEXERIL) 10 MG tablet TAKE 1/2 TABLET (5MG  TOTAL)3 TIMES A DAY AS NEEDED    FOR MUSCLE SPASMS    denosumab (PROLIA) 60 MG/ML SOSY injection Inject 60 mg into the skin every 6 (six) months.    DULoxetine 40 MG CPEP Take 40 mg by mouth daily.    furosemide (LASIX) 40 MG tablet Take 1 tablet (40 mg total) by mouth daily as needed. (Patient not taking: Reported on 10/14/2020)    gabapentin (NEURONTIN) 100 MG capsule Take 1 capsule (100 mg total) by mouth 2 (two) times daily. 05/20/2020: Taking QPM   glucosamine-chondroitin 500-400 MG tablet Take 1 tablet by mouth 3 (three)  times daily. 06/16/2020: Reports taking once a day   glucose blood (ACCU-CHEK AVIVA PLUS) test strip Use to check blood sugar up to 4 times daily    HYDROcodone-acetaminophen (NORCO/VICODIN) 5-325 MG tablet Norco 5 mg-325 mg tablet  Take 1 tablet twice a day by oral route as needed.    Lancets MISC Use up to 4 times daily to check blood sugars.    Loratadine 5 MG TBDP Take 1 tablet (5 mg total) by mouth daily. (Patient not taking: Reported on 10/14/2020)    losartan (COZAAR) 50 MG tablet TAKE 1 TABLET DAILY    Melatonin 5 MG CHEW Chew 5 mg  by mouth daily.    potassium chloride (K-DUR) 10 MEQ tablet  (Patient not taking: Reported on 10/14/2020) 11/06/2017: Only takes when she takes furosemide   triamcinolone ointment (KENALOG) 0.5 % Apply 1 application topically 2 (two) times daily.    No facility-administered encounter medications on file as of 10/21/2020.    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   Headache disorder 09/10/2018   Cervico-occipital neuralgia 07/23/2018   Osteoporosis 07/23/2018   Cramp and spasm 02/12/2018   Paresthesia of hand 10/25/2017   Lesion of rectum 09/07/2017   Loss of height 08/15/2017   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   Cervical spondylosis without myelopathy 08/15/2017   DDD (degenerative disc disease), cervical 08/15/2017   Bursitis of hip 08/15/2017   Female stress incontinence 08/15/2017   Muscle weakness 08/15/2017   Neck pain 08/15/2017   Spondylolisthesis, congenital 08/15/2017   Radial styloid tenosynovitis 08/15/2017   Senile osteoporosis 08/15/2017   Hip pain 08/15/2017   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   Rectal hemorrhage 07/25/2017   Essential tremor 04/26/2017   Other fatigue 03/22/2017   Left ventricular hypertrophy 10/12/2016   Chronic kidney disease, stage 4 (severe) ([place]) 08/26/2016   OSA on CPAP 08/26/2016    Conditions to be addressed/monitored:HTN and DMII  Care Plan : Diabetes Type 2 (Adult)   Updates made by Leona Singleton, RN since 10/21/2020 12:00 AM     Problem: Glycemic Management (Diabetes, Type 2)   Priority: Medium     Long-Range Goal: Patient will report decrease in Hgb A1C by 0.2 points within the next 90 days   Start Date: 06/16/2020  Expected End Date: 05/07/2021  This Visit's Progress: On track  Recent Progress: On track  Priority: Medium  Note:   Objective:  Lab Results  Component Value Date   HGBA1C 7.8 (H) 08/11/2020  Current Barriers:  Knowledge Deficits related to basic Diabetes pathophysiology and self care/management as evidenced by increasing Hgb A1C.  Reports continued use of her Freestyle Lil[place] continuous monitoring system.  Fasting blood sugar this morning was 53 (did not eat bedtime snack; up to 191 after treatment and eating) with recent fasting ranges in 120's.  Continues to report increasing ranges as the day goes.  Patient continues to report itchy rash that changes  spots on her body.  Is using new cream prescribed by PCP and reports today is first day rash has not been as itchy. Case Manager Clinical Goal(s):  Patient will demonstrate improved adherence to prescribed treatment plan for diabetes self care/management as evidenced by: Four times a day monitoring and recording of CBG, adherence to ADA/ carb modified diet, exercise at least 3 days/week, adherence to prescribed medication regimen Interventions:  Collaboration with [name], MD regarding development and update of comprehensive plan of care as evidenced by provider attestation and co-signature Inter-disciplinary care team collaboration (see longitudinal plan of care) Reviewed medications with patient and medication changes and discussed importance of medication adherence Discussed plans with patient for ongoing care management follow up and provided patient with direct contact information for care management team Discussed signs and symptoms of hypo and hyperglycemia and  importance of correct treatment (not to 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   Discussed food diary and carbohydrate modified diet, encouraged to continue to keep food diary taking to follow up appointments, encouraged healthier food options and increasing vegetable intake Discussed carbohydrate counting and diabetic diet, sent EMMI Diabetes Diet Video; encouraged to view Sent Diabetes Diet and Carbohydrate Counting handouts; encouraged to review Encouraged to continue to work with CCM Pharmacist and to contact CCM Pharmacist for sustained hyperglycemic episodes for possible medication adjustments Discussed appropriate meal portions and how to use food diary with making good food choices Discussed increasing Hgb A1C and ways to help reduce Discussed and encouraged eating bedtime snack nightly to help prevent early morning hypoglycemia Discussed body rash and treatment and encouraged patient to contact provider if rash dose not clear with new cream in the next 2 weeks Patient Goals/Self-Care Activities Over the next 45 days, patient will:  Check blood sugar at prescribed times (at least 4 times a day)  Check blood sugar if I feel it is too high or too low, calling provider for sustained highs or lows Enter blood sugar readings and medication/insulin into daily log taking to provider for review Continue to keep food diary notating what foods elevate blood sugars Try to increase fruits and vegetables in diet (eat them daily) Take medication as prescribed Current Hgb A1C is increased 7.8 with goal being below 7 Make sure  you eat a bedtime snack nightly to help prevent morning hypoglycemia Follow Up Plan: The care management team will reach out to the patient again over the next 45 business days.      Care Plan : Hypertension (Adult)  Updates made by Leona Singleton, RN since 10/21/2020 12:00 AM     Problem: Hypertension (Hypertension)   Priority: Medium     Long-Range Goal: Hypertension Monitored   Start Date: 06/16/2020  Expected End Date: 12/13/2020  This Visit's Progress: On track  Recent Progress: On track  Priority: Medium  Note:   Current Barriers:  Knowledge Deficits related to basic understanding of hypertension pathophysiology and self care management; has been monitoring blood pressures at home she says about once a week at least.  Reports systolic blood pressures have ranged in 130's but she is unsure of the "bottom number".  Does  state "my blood pressures have been in good range".  Denies any hyper or hypotensive symptoms or episodes.  Reports chronic back pain is controlled with one hydrocodone a day. Nurse Case Manager Clinical Goal(s):  Over the next 90 days, patient will verbalize understanding of plan for hypertension management Over the next 90 days, patient will attend all scheduled medical appointments Over the next 90 days, patient will demonstrate improved adherence to prescribed treatment plan for hypertension as evidenced by taking all medications as prescribed, monitoring and recording blood pressure as directed, adhering to low sodium/DASH diet Interventions:  Collaboration with [name], MD regarding development and update of comprehensive plan of care as evidenced by provider attestation and co-signature Inter-disciplinary care team collaboration (see longitudinal plan of care) Evaluation of current treatment plan related to hypertension self management and patient's adherence to plan as established by provider. Reviewed medications with patient and discussed  importance of compliance Discussed plans with patient for ongoing care management follow up and provided patient with direct contact information for care management team Advised patient, providing education and rationale, to monitor blood pressure daily and record, calling PCP for findings outside established parameters.  Reviewed scheduled/upcoming provider appointments including: encouraged to contact PCP if rash does not clear in the next 2 weeks Discussed sleep and sleep pattern and encouraged patient to stay awake most of the day, avoid cell phone/screen time at bedtime, trying to decrease stimuli at bedtime Instructed and encouraged patient to place Calendar near medications or blood pressure cuff for ease of logging pressures when she takes them Encouraged to increase activity as tolerated, fall precautions and preventions reviewed and discussed Reviewed signs and symptoms of hypertension and encouraged patient to continue to self monitor Again discussed and encouraged to use 2022 [place] to log vital signs for provider review Patient Goals/Self-Care Activities:  Over the next 45 days, patient will: Check blood pressure at least weekly Write blood pressure results in a log or diary and take to medical appointments Keep log near medications and blood pressure machine to remind yourself to write down vitals in log Continue to monitor self for signs and symptoms of high blood pressure (swelling in extremities, headache, etc) Increase activity as tolerated and weather allows Follow Up Plan: The care management team will reach out to the patient again over the next 45 business days.       Plan:The care management team will reach out to the patient again over the next 45 business days.  Hubert Azure RN, MSN RN Care Management Coordinator [place] [phone] [email or web address]

## 2020-10-21 NOTE — Patient Instructions (Signed)
Visit Information  PATIENT GOALS:  Goals Addressed             This Visit's Progress    (CCM RNCM) Monitor and Manage My Blood Sugar   On track    Timeframe:  Long-Range Goal Priority:  Medium Start Date:  06/16/20                           Expected End Date:  05/07/21                     Follow Up Date 11/25/20   Check blood sugar at prescribed times (at least 4 times a day)  Check blood sugar if I feel it is too high or too low, calling provider for sustained highs or lows Enter blood sugar readings and medication/insulin into daily log taking to provider for review Continue to keep food diary notating what foods elevate blood sugars Try to increase fruits and vegetables in diet (eat them daily) Take medication as prescribed Current Hgb A1C is increased 7.8 with goal being below 7 Make sure you eat a bedtime snack nightly to help prevent morning hypoglycemia   Why is this important?   Checking your blood sugar at home helps to keep it from getting very high or very low.  Writing the results in a diary or log helps the doctor know how to care for you.  Your blood sugar log should have the time, date and the results.  Also, write down the amount of insulin or other medicine that you take.  Other information, like what you ate, exercise done and how you were feeling, will also be helpful.        (CCM RNCM) Track and Manage My Blood Pressure-Hypertension   On track    Timeframe:  Long-Range Goal Priority:  Medium Start Date: 06/16/20                            Expected End Date:  05/07/21                     Follow Up Date 11/25/20    Check blood pressure at least weekly Write blood pressure results in a log or diary and take to medical appointments Keep log near medications and blood pressure machine to remind yourself to write down vitals in log Continue to monitor self for signs and symptoms of high blood pressure (swelling in extremities, headache, etc) Increase activity as  tolerated and weather allows   Why is this important?   You won't feel high blood pressure, but it can still hurt your blood vessels.  High blood pressure can cause heart or kidney problems. It can also cause a stroke.  Making lifestyle changes like losing a little weight or eating less salt will help.  Checking your blood pressure at home and at different times of the day can help to control blood pressure.  If the doctor prescribes medicine remember to take it the way the doctor ordered.  Call the office if you cannot afford the medicine or if there are questions about it.           Patient verbalizes understanding of instructions provided today and agrees to view in [place].   The care management team will reach out to the patient again over the next 45 business days.   Hubert Azure RN, MSN  RN Care Management Coordinator [place] [phone] [email or web address]

## 2020-10-28 DIAGNOSIS — L309 Dermatitis, unspecified: Secondary | ICD-10-CM | POA: Diagnosis not present

## 2020-10-28 DIAGNOSIS — X32XXXA Exposure to sunlight, initial encounter: Secondary | ICD-10-CM | POA: Diagnosis not present

## 2020-10-28 DIAGNOSIS — L821 Other seborrheic keratosis: Secondary | ICD-10-CM | POA: Diagnosis not present

## 2020-10-28 DIAGNOSIS — D2271 Melanocytic nevi of right lower limb, including hip: Secondary | ICD-10-CM | POA: Diagnosis not present

## 2020-10-28 DIAGNOSIS — L570 Actinic keratosis: Secondary | ICD-10-CM | POA: Diagnosis not present

## 2020-10-28 DIAGNOSIS — D2262 Melanocytic nevi of left upper limb, including shoulder: Secondary | ICD-10-CM | POA: Diagnosis not present

## 2020-10-28 DIAGNOSIS — D225 Melanocytic nevi of trunk: Secondary | ICD-10-CM | POA: Diagnosis not present

## 2020-10-28 DIAGNOSIS — Z85828 Personal history of other malignant neoplasm of skin: Secondary | ICD-10-CM | POA: Diagnosis not present

## 2020-10-29 DIAGNOSIS — M25551 Pain in right hip: Secondary | ICD-10-CM | POA: Diagnosis not present

## 2020-10-29 DIAGNOSIS — M7061 Trochanteric bursitis, right hip: Secondary | ICD-10-CM | POA: Diagnosis not present

## 2020-11-01 DIAGNOSIS — M25551 Pain in right hip: Secondary | ICD-10-CM | POA: Diagnosis not present

## 2020-11-01 DIAGNOSIS — M7061 Trochanteric bursitis, right hip: Secondary | ICD-10-CM | POA: Diagnosis not present

## 2020-11-03 ENCOUNTER — Telehealth: Payer: Self-pay | Admitting: Family Medicine

## 2020-11-03 ENCOUNTER — Ambulatory Visit (INDEPENDENT_AMBULATORY_CARE_PROVIDER_SITE_OTHER): Payer: Medicare Other

## 2020-11-03 VITALS — Ht 64.0 in | Wt 126.0 lb

## 2020-11-03 DIAGNOSIS — Z0000 Encounter for general adult medical examination without abnormal findings: Secondary | ICD-10-CM | POA: Diagnosis not present

## 2020-11-03 NOTE — Telephone Encounter (Signed)
Pt called she got a steroid shot and now her Blood sugar are running around 250-300 and wanted to know if she needed to increase her insulin

## 2020-11-03 NOTE — Telephone Encounter (Signed)
Noted.  Please follow-up with the patient on Wednesday morning to see what her blood glucose was overnight.  Thanks.

## 2020-11-03 NOTE — Progress Notes (Signed)
Subjective:   Kelly [name] is a 80 y.o. female who presents for Medicare Annual (Subsequent) preventive examination.  Review of Systems    No ROS.  Medicare Wellness Virtual Visit.  Visual/audio telehealth visit, UTA vital signs.   See social history for additional risk factors.   Cardiac Risk Factors include: advanced age (>40men, >34 women);diabetes mellitus     Objective:    Today's Vitals   11/03/20 0907  Weight: 126 lb (57.2 kg)  Height: 5\' 4"  (1.626 m)   Body mass index is 21.63 kg/m.  Advanced Directives 11/03/2020 06/16/2020 05/22/2020 03/02/2020 12/17/2019 11/01/2019 04/11/2019  Does Patient Have a Medical Advance Directive? Yes Yes No No Yes Yes No  Type of Advance Directive Living will;Healthcare Power of [place];Living will - - [place];Living will [place];Living will -  Does patient want to make changes to medical advance directive? - No - Patient declined - - - No - Patient declined -  Copy of Time in Chart? No - copy requested No - copy requested - - - No - copy requested -  Would patient like information on creating a medical advance directive? - - No - Patient declined No - Patient declined - - No - Patient declined    Current Medications (verified) Outpatient Encounter Medications as of 11/03/2020  Medication Sig   amLODipine (NORVASC) 5 MG tablet Take 1 tablet (5 mg total) by mouth daily.   aspirin EC 81 MG tablet Take 81 mg by mouth daily.   atorvastatin (LIPITOR) 80 MG tablet Take 1 tablet (80 mg total) by mouth daily.   BD PEN NEEDLE NANO U/F 32G X 4 MM MISC USE DAILY WITH VICTOZA AND BASAGLAR   bisacodyl (DULCOLAX) 5 MG EC tablet Take 5 mg by mouth daily as needed for moderate constipation.   calcitRIOL (ROCALTROL) 0.25 MCG capsule Take 0.25 mcg by mouth daily.   Calcium Citrate-Vitamin D (CALCIUM CITRATE +D PO) Take 600 mg by mouth daily.     Cholecalciferol (VITAMIN D) 50 MCG (2000 UT) CAPS Take 2,000 Units by mouth daily.    ciclopirox (PENLAC) 8 % solution Apply topically at bedtime. Apply over nail and surrounding skin. Apply daily over previous coat. After seven (7) days, may remove with alcohol and continue cycle. (Patient not taking: Reported on 10/14/2020)   Continuous Blood Gluc Sensor (FREESTYLE LIBRE 2 SENSOR) MISC Use to check sugar at least 4 times daily   cyclobenzaprine (FLEXERIL) 10 MG tablet TAKE 1/2 TABLET (5MG  TOTAL)3 TIMES A DAY AS NEEDED    FOR MUSCLE SPASMS   denosumab (PROLIA) 60 MG/ML SOSY injection Inject 60 mg into the skin every 6 (six) months.   DULoxetine 40 MG CPEP Take 40 mg by mouth daily.   furosemide (LASIX) 40 MG tablet Take 1 tablet (40 mg total) by mouth daily as needed. (Patient not taking: Reported on 10/14/2020)   gabapentin (NEURONTIN) 100 MG capsule Take 1 capsule (100 mg total) by mouth 2 (two) times daily.   glucosamine-chondroitin 500-400 MG tablet Take 1 tablet by mouth 3 (three) times daily.   glucose blood (ACCU-CHEK AVIVA PLUS) test strip Use to check blood sugar up to 4 times daily   HYDROcodone-acetaminophen (NORCO/VICODIN) 5-325 MG tablet    Insulin Glargine (BASAGLAR KWIKPEN) 100 UNIT/ML Inject 8 Units into the skin daily.   Lancets MISC Use up to 4 times daily to check blood sugars.   liraglutide (VICTOZA) 18  MG/3ML SOPN Inject 1.2 mg into the skin daily.   Loratadine 5 MG TBDP Take 1 tablet (5 mg total) by mouth daily. (Patient not taking: Reported on 10/14/2020)   losartan (COZAAR) 50 MG tablet TAKE 1 TABLET DAILY   Melatonin 5 MG CHEW Chew 5 mg by mouth daily.   potassium chloride (K-DUR) 10 MEQ tablet  (Patient not taking: Reported on 10/14/2020)   triamcinolone ointment (KENALOG) 0.5 % Apply 1 application topically 2 (two) times daily.   No facility-administered encounter medications on file as of 11/03/2020.    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    Past Surgical History:  Procedure Laterality Date   APPENDECTOMY     BREAST BIOPSY Right 06/19/2018   affirm stereo/x clip/COLUMNAR CELL CHANGE WITH MICROCALCIFICATIONS. FIBROCYSTIC CHANGES WITH MICROCALCIFICATIONS   cataract  03/2017   CHOLECYSTECTOMY     COLONOSCOPY     COLONOSCOPY WITH PROPOFOL N/A 08/22/2017   Procedure: COLONOSCOPY WITH PROPOFOL;  Surgeon: Jonathon Bellows, MD;  Location: [facility] ENDOSCOPY;  Service: Gastroenterology;  Laterality: N/A;   ESOPHAGOGASTRODUODENOSCOPY     ESOPHAGOGASTRODUODENOSCOPY (EGD) WITH PROPOFOL N/A 03/23/2016   Procedure: ESOPHAGOGASTRODUODENOSCOPY (EGD) WITH PROPOFOL;  Surgeon: Manya Silvas, MD;  Location: [facility] ENDOSCOPY;  Service: Endoscopy;  Laterality: N/A;   FLEXIBLE SIGMOIDOSCOPY N/A 02/06/2018   Procedure: FLEXIBLE SIGMOIDOSCOPY;  Surgeon: Jonathon Bellows, MD;  Location: [facility] ENDOSCOPY;  Service: Gastroenterology;  Laterality: N/A;   FLEXIBLE SIGMOIDOSCOPY N/A 12/03/2018   Procedure: FLEXIBLE SIGMOIDOSCOPY;  Surgeon: Jonathon Bellows, MD;  Location: [facility] ENDOSCOPY;  Service: Gastroenterology;  Laterality: N/A;   RECTAL PROLAPSE REPAIR  2012   x2   Family History  Problem Relation Age of Onset   Stroke Mother    Arthritis Mother    Aortic aneurysm Sister    Lung cancer Sister    Heart attack Father 56   Breast cancer Sister 74   Social History   Socioeconomic History   Marital status: Widowed    Spouse name: Not on file   Number of children: Not on file   Years of education: Not on file   Highest education level: High school graduate  Occupational History   Occupation: retired   Tobacco Use    Smoking status: Never   Smokeless tobacco: Never  Vaping Use   Vaping Use: Never used  Substance and Sexual Activity   Alcohol use: No   Drug use: No   Sexual activity: Not Currently    Partners: Male  Other Topics Concern   Not on file  Social History Narrative   Not on file   Social Determinants of Health   Financial Resource Strain: Low Risk    Difficulty of Paying Living Expenses: Not hard at all  Food Insecurity: No Food Insecurity   Worried About Charity fundraiser in the Last Year: Never true   [place] in the Last Year: Never true  Transportation Needs: No Transportation Needs   Lack of Transportation (Medical): No   Lack of Transportation (Non-Medical): No  Physical Activity: Insufficiently Active   Days of  Exercise per Week: 3 days   Minutes of Exercise per Session: 20 min  Stress: No Stress Concern Present   Feeling of Stress : Only a little  Social Connections: Unknown   Frequency of Communication with Friends and Family: More than three times a week   Frequency of Social Gatherings with Friends and Family: Not on file   Attends Religious Services: Not on Electrical engineer or Organizations: Not on file   Attends Archivist Meetings: Not on file   Marital Status: Not on file    Tobacco Counseling Counseling given: Not Answered   Clinical Intake:  Pre-visit preparation completed: Yes        Diabetes: Yes  How often do you need to have someone help you when you read instructions, pamphlets, or other written materials from your doctor or pharmacy?: 1 - Never  Diabetes Management:  Are you having any financial strains with the device, your supplies or your medication?   Is the patient seen by Chronic Care Management for management of their diabetes?  Yes   Diabetic Exams: Diabetic Foot Exam- Due. Followed  by PCP. Notes no changes or wounds.   Interpreter Needed?: No   Activities of Daily Living In your present  state of health, do you have any difficulty performing the following activities: 11/03/2020  Hearing? Y  Comment Hearing aids  Vision? N  Difficulty concentrating or making decisions? N  Walking or climbing stairs? Y  Comment Paces herself  Dressing or bathing? N  Doing errands, shopping? Y  Comment Does not drive alone when hip hurts  Preparing Food and eating ? N  Using the Toilet? N  In the past six months, have you accidently leaked urine? N  Do you have problems with loss of bowel control? N  Managing your Medications? N  Managing your Finances? N  Housekeeping or managing your Housekeeping? N  Some recent data might be hidden    Patient Care Team: [name], MD as PCP - General (Family Medicine) [name], [place] as Pharmacist (Pharmacist) Murlean Iba, MD (Nephrology) Felipa Furnace, DPM as Consulting Physician (Podiatry) Leona Singleton, RN as Case Manager  Indicate any recent Medical Services you may have received from other than [facility] providers in the past year (date may be approximate).     Assessment:   This is a routine wellness examination for Kelly [name].  I connected with Stacye today by telephone and verified that I am speaking with the correct person using two identifiers. Location patient: home Location provider: work Persons participating in the virtual visit: patient, Marine scientist.    I discussed the limitations, risks, security and privacy concerns of performing an evaluation and management service by telephone and the availability of in person appointments. The patient expressed understanding and verbally consented to this telephonic visit.    Interactive audio and video telecommunications were attempted between this provider and patient, however failed, due to patient having technical difficulties OR patient did not have access to video capability.  We continued and completed visit with audio only.  Some vital signs may be absent or  patient reported.   Hearing/Vision screen Hearing Screening - Comments:: Hearing aids Vision Screening - Comments:: Followed by Dr. Ellin Mayhew Cataract extraction, bilateral They have seen their ophthalmologist in the last 12 months.    Dietary issues and exercise activities discussed: Current Exercise Habits: Home exercise routine, Type of exercise: walking, Intensity: Mild Healthy diet Good water intake  Goals Addressed             This Visit's Progress    DIET - REDUCE SUGAR INTAKE       Low carb diet        Depression Screen PHQ 2/9 Scores 11/03/2020 07/17/2020 06/16/2020 05/22/2020 04/14/2020 03/30/2020 03/20/2020  PHQ - 2 Score 0 1 0 1 1 2 1   PHQ- 9 Score - - - - 1 6 3     Fall Risk Fall Risk  11/03/2020 06/16/2020 01/01/2020 12/17/2019 11/01/2019  Falls in the past year? 0 1 1 1  0  Comment - - - fell in december 2020 None since last reported in November 2020  Number falls in past yr: 0 0 1 - 0  Comment - tripped over dog leash January 2022 - - -  Injury with Fall? 0 0 0 - -  Risk Factor Category  - - - - -  Risk for fall due to : - Medication side effect History of fall(s) - -  Follow up Falls evaluation completed Falls prevention discussed;Education provided;Falls evaluation completed Falls evaluation completed - Falls evaluation completed    FALL RISK PREVENTION PERTAINING TO THE HOME: Handrails in use when climbing stairs? Yes Home free of loose throw rugs in walkways, pet beds, electrical cords, etc? Yes  Adequate lighting in your home to reduce risk of falls? Yes   ASSISTIVE DEVICES UTILIZED TO PREVENT FALLS:  Life alert? No  Use of a cane, walker or w/c? No   TIMED UP AND GO: Was the test performed? No .   Cognitive Function:  Patient is alert and oriented x3.    6CIT Screen 11/01/2019 10/31/2018 10/25/2017  What Year? 0 points 0 points 0 points  What month? 0 points 0 points 0 points  What time? 0 points 0 points 0 points  Count back from 20 0 points 0  points 0 points  Months in reverse 0 points 0 points 0 points  Repeat phrase 0 points 0 points 0 points  Total Score 0 0 0    Immunizations Immunization History  Administered Date(s) Administered   Fluad Quad(high Dose 65+) 02/25/2019, 03/02/2020   Influenza, High Dose Seasonal PF 02/24/2017, 02/28/2018   Influenza,inj,quad, With Preservative 02/19/2016   Moderna Sars-Covid-2 Vaccination 07/05/2019, 08/12/2019, 03/02/2020, 08/10/2020   Td 02/26/2019   Zoster Recombinat (Shingrix) 01/02/2020, 03/10/2020   Health Maintenance Health Maintenance  Topic Date Due   FOOT EXAM  09/30/2020   INFLUENZA VACCINE  12/07/2020   COVID-19 Vaccine (5 - Booster for Moderna series) 12/10/2020   OPHTHALMOLOGY EXAM  12/30/2020   HEMOGLOBIN A1C  02/10/2021   TETANUS/TDAP  02/25/2029   DEXA SCAN  Completed   Hepatitis C Screening  Completed   PNA vac Low Risk Adult  Completed   Zoster Vaccines- Shingrix  Completed   HPV VACCINES  Aged Out    Mammogram status: Completed 10/17/19. Repeat every year  Lung Cancer Screening: (Low Dose CT Chest recommended if Age 29-80 years, 30 pack-year currently smoking OR have quit w/in 15years.) does not qualify.   Vision Screening: Recommended annual ophthalmology exams for early detection of glaucoma and other disorders of the eye.  Dental Screening: Recommended annual dental exams for proper oral hygiene  Community Resource Referral / Chronic Care Management: CRR required this visit?  No   CCM required this visit?  No      Plan:   Keep all routine maintenance appointments.   I have personally reviewed and noted the  following in the patient's chart:   Medical and social history Use of alcohol, tobacco or illicit drugs  Current medications and supplements including opioid prescriptions. Patient is currently taking opioid managed by ortho physician, Dr. Yvetta Coder. Functional ability and status Nutritional status Physical activity Advanced  directives List of other physicians Hospitalizations, surgeries, and ER visits in previous 12 months Vitals Screenings to include cognitive, depression, and falls Referrals and appointments  In addition, I have reviewed and discussed with patient certain preventive protocols, quality metrics, and best practice recommendations. A written personalized care plan for preventive services as well as general preventive health recommendations were provided to patient via mychart.     Varney Biles, LPN   08/09/[id number]

## 2020-11-03 NOTE — Telephone Encounter (Signed)
Patient has been notified. Current BG number is 300.

## 2020-11-03 NOTE — Patient Instructions (Addendum)
Kelly [name] , Thank you for taking time to come for your Medicare Wellness Visit. I appreciate your ongoing commitment to your health goals. Please review the following plan we discussed and let me know if I can assist you in the future.   These are the goals we discussed:  Goals       Patient Stated     PharmD - Patient Self Monitoring (pt-stated)      Patient Goals/Self-Care Activities Over the next 90 days, patient will:  - take medications as prescribed - check glucose at least three times daily using continuous glucose monitor, document, and provide at future appointments - target a minimum of 150 minutes of moderate intensity exercise weekly - start keeping a food diary to better monitor impact of dietary choices       Other     (CCM RNCM) Monitor and Manage My Blood Sugar      Timeframe:  Long-Range Goal Priority:  Medium Start Date:  06/16/20                           Expected End Date:  05/07/21                     Follow Up Date 11/25/20   Check blood sugar at prescribed times (at least 4 times a day)  Check blood sugar if I feel it is too high or too low, calling provider for sustained highs or lows Enter blood sugar readings and medication/insulin into daily log taking to provider for review Continue to keep food diary notating what foods elevate blood sugars Try to increase fruits and vegetables in diet (eat them daily) Take medication as prescribed Current Hgb A1C is increased 7.8 with goal being below 7 Make sure you eat a bedtime snack nightly to help prevent morning hypoglycemia   Why is this important?   Checking your blood sugar at home helps to keep it from getting very high or very low.  Writing the results in a diary or log helps the doctor know how to care for you.  Your blood sugar log should have the time, date and the results.  Also, write down the amount of insulin or other medicine that you take.  Other information, like what you ate, exercise done  and how you were feeling, will also be helpful.         (CCM RNCM) Track and Manage My Blood Pressure-Hypertension      Timeframe:  Long-Range Goal Priority:  Medium Start Date: 06/16/20                            Expected End Date:  05/07/21                     Follow Up Date 11/25/20    Check blood pressure at least weekly Write blood pressure results in a log or diary and take to medical appointments Keep log near medications and blood pressure machine to remind yourself to write down vitals in log Continue to monitor self for signs and symptoms of high blood pressure (swelling in extremities, headache, etc) Increase activity as tolerated and weather allows   Why is this important?   You won't feel high blood pressure, but it can still hurt your blood vessels.  High blood pressure can cause heart or kidney problems. It can also cause  a stroke.  Making lifestyle changes like losing a little weight or eating less salt will help.  Checking your blood pressure at home and at different times of the day can help to control blood pressure.  If the doctor prescribes medicine remember to take it the way the doctor ordered.  Call the office if you cannot afford the medicine or if there are questions about it.         DIET - REDUCE SUGAR INTAKE      Low carb diet         This is a list of the screening recommended for you and due dates:  Health Maintenance  Topic Date Due   Complete foot exam   09/30/2020   Flu Shot  12/07/2020   COVID-19 Vaccine (5 - Booster for Moderna series) 12/10/2020   Eye exam for diabetics  12/30/2020   Hemoglobin A1C  02/10/2021   Tetanus Vaccine  02/25/2029   DEXA scan (bone density measurement)  Completed   Hepatitis C Screening: USPSTF Recommendation to screen - Ages 9-79 yo.  Completed   Pneumonia vaccines  Completed   Zoster (Shingles) Vaccine  Completed   HPV Vaccine  Aged Out   Opioid Pain Medicine Management Opioid pain medicines are strong  medicines that are used to treat bad or very bad pain. When you take them for a short time, they can help you: Sleep better. Do better in physical therapy. Feel better during the first few days after you get hurt. Recover from surgery. Only take these medicines if a doctor says that you can. You should only take them for a short time. This is because opioids can be hard to stop taking (they are addictive). The longer you take opioids, the harder it may be to stop taking them (opioid use disorder). What are the risks? Opioids can cause problems (side effects). Taking them for more than 3 days raises your chance of problems, such as: Trouble pooping (constipation). Feeling sick to your stomach (nausea). Vomiting. Feeling very sleepy. Confusion. Not being able to stop taking the medicine. Breathing problems. Taking opioids for a long time can make it hard for you to do daily tasks. It can also put you at risk for: Car accidents. Depression. Suicide. Heart attack. Taking too much of the medicine (overdose), which can sometimes lead to death. What is a pain treatment plan? A pain treatment plan is a plan made by you and your doctor. Work with your doctor to make a plan for treating your pain. To help you do this: Talk about the goals of your treatment, including: How much pain you might expect to have. How you will manage the pain. Talk about the risks and benefits of taking these medicines for your condition. Remember that a good treatment plan uses more than one approach and lowers the risks of side effects. Tell your doctor about the amount of medicines you take and about any drug or alcohol use. Get your pain medicine prescriptions from only one doctor. Pain can be managed with other treatments. Work with your doctor to find other ways to help your pain, such as: Physical therapy. Counseling. Eating healthy foods. Brain exercises. Massage. Meditation. Other pain medicines. Doing  gentle exercises. Tapering your use of opioids If you have been taking opioids for more than a few weeks, you may need to slowly decrease (taper) how much you take until you stop taking them. Doing this can lower your chance of having symptoms, such  as: Pain and cramping in your belly (abdomen). Feeling sick to your stomach. Sweating. Feeling very sleepy. Feeling restless. Shaking you cannot control (tremors). Cravings for the medicine. Do not try to stop taking them by yourself. Work with your doctor to stop. Your doctorwill help you take less until you are not taking the medicine at all. Follow these instructions at home: Safety and storage  While you are taking opioids: Do not drive. Do not use machines or power tools. Do not sign important papers (legal documents). Do not drink alcohol. Do not take sleeping pills. Do not take care of children by yourself. Do not do activities where you need to climb or be in high places, like working on a ladder. Do not go into any water, such as a lake, river, ocean, swimming pool, or hot tub. Keep your opioids locked up or in a place where children cannot reach them. Do not share your pain medicine with anyone.  Getting rid of leftover pills Do not save any leftover pills. Get rid of leftover pills safely by: Taking them to a take-back program in your area. Bringing them to a pharmacy that has a container for throwing away pills (pill disposal). Flushing them down the toilet. Check the label or package insert of your medicine to see whether this is safe to do. Throwing them in the trash. Check the label or package insert of your medicine to see whether this is safe to do. If it is safe to throw them out: Take the pills out of their container. Put the pills into a container you can seal. Mix the pills with used coffee grounds, food scraps, dirt, or cat litter. Put this in the trash. Activity Return to your normal activities as told by your  doctor. Ask your doctor what activities are safe for you. Avoid doing things that make your pain worse. Do exercises as told by your doctor. General instructions You may need to take these actions to prevent or treat trouble pooping: Drink enough fluid to keep your pee (urine) pale yellow. Take over-the-counter or prescription medicines. Eat foods that are high in fiber. These include beans, whole grains, and fresh fruits and vegetables. Limit foods that are high in fat and sugar. These include fried or sweet foods. Keep all follow-up visits as told by your doctor. This is important. Where to find support If you have been taking opioids for a long time, think about getting helpquitting from a local support group or counselor. Ask your doctor about this. Where to find more information Centers for Disease Control and Prevention (CDC): http://www.wolf.info/ [place] Food and Drug Administration (FDA): [place] Get help right away if: Seek medical care right away if you are taking opioids and you, or people close to you, notice any of the following: You have trouble breathing. Your breathing is slower or more shallow than normal. You have a very slow heartbeat. You feel very confused. You pass out (faint). You are very sleepy. Your speech is not normal. You feel sick to your stomach and vomit. You have cold skin. You have blue lips or fingernails. Your muscles are weak (limp) and your body seems floppy. The black centers of your eyes (pupils) are smaller than normal. If you think that you or someone else may have taken too much of an opioid medicine, get medical help right away. Call your local emergency services (911 in the [place] Do not drive yourself to the hospital. If you ever feel like you may  hurt yourself or others, or have thoughts about taking your own life, get help right away. You can go to your nearest emergency department or call: Your local emergency services (911 in the [place] The  hotline of the [facility] (579)278-4842 in the [place] A suicide crisis helpline, such as the [place] at 878 695 8405. This is open 24 hours a day. Summary Opioid are strong medicines that are used to treat bad or very bad pain. A pain treatment plan is a plan made by you and your doctor. Work with your doctor to make a plan for treating your pain. Work with your doctor to find other ways to help your pain. If you think that you or someone else may have taken too much of an opioid, get help right away. This information is not intended to replace advice given to you by your health care provider. Make sure you discuss any questions you have with your healthcare provider. Document Revised: 04/17/2020 Document Reviewed: 05/25/2018 Elsevier Patient Education  [place].

## 2020-11-03 NOTE — Telephone Encounter (Signed)
This elevation is not uncommon and her glucose should improve the further out she gets from the injection. I would advise monitoring for now and if she gets in to the 300s she should contact us for advice.

## 2020-11-04 ENCOUNTER — Emergency Department
Admission: EM | Admit: 2020-11-04 | Discharge: 2020-11-04 | Disposition: A | Discharge status: Home / Self Care | Payer: Medicare Other | Attending: Emergency Medicine | Admitting: Emergency Medicine

## 2020-11-04 ENCOUNTER — Other Ambulatory Visit: Payer: Self-pay

## 2020-11-04 DIAGNOSIS — Z7982 Long term (current) use of aspirin: Secondary | ICD-10-CM | POA: Insufficient documentation

## 2020-11-04 DIAGNOSIS — Z79899 Other long term (current) drug therapy: Secondary | ICD-10-CM | POA: Insufficient documentation

## 2020-11-04 DIAGNOSIS — Z794 Long term (current) use of insulin: Secondary | ICD-10-CM | POA: Diagnosis not present

## 2020-11-04 DIAGNOSIS — M542 Cervicalgia: Secondary | ICD-10-CM | POA: Diagnosis not present

## 2020-11-04 DIAGNOSIS — N184 Chronic kidney disease, stage 4 (severe): Secondary | ICD-10-CM | POA: Diagnosis not present

## 2020-11-04 DIAGNOSIS — M5032 Other cervical disc degeneration, mid-cervical region, unspecified level: Secondary | ICD-10-CM | POA: Diagnosis not present

## 2020-11-04 DIAGNOSIS — Z85828 Personal history of other malignant neoplasm of skin: Secondary | ICD-10-CM | POA: Insufficient documentation

## 2020-11-04 DIAGNOSIS — R739 Hyperglycemia, unspecified: Secondary | ICD-10-CM | POA: Diagnosis present

## 2020-11-04 DIAGNOSIS — Q762 Congenital spondylolisthesis: Secondary | ICD-10-CM | POA: Diagnosis not present

## 2020-11-04 DIAGNOSIS — M6281 Muscle weakness (generalized): Secondary | ICD-10-CM | POA: Diagnosis not present

## 2020-11-04 DIAGNOSIS — E1122 Type 2 diabetes mellitus with diabetic chronic kidney disease: Secondary | ICD-10-CM | POA: Insufficient documentation

## 2020-11-04 DIAGNOSIS — M7062 Trochanteric bursitis, left hip: Secondary | ICD-10-CM | POA: Diagnosis not present

## 2020-11-04 DIAGNOSIS — I129 Hypertensive chronic kidney disease with stage 1 through stage 4 chronic kidney disease, or unspecified chronic kidney disease: Secondary | ICD-10-CM | POA: Insufficient documentation

## 2020-11-04 DIAGNOSIS — M25559 Pain in unspecified hip: Secondary | ICD-10-CM | POA: Diagnosis not present

## 2020-11-04 DIAGNOSIS — M5136 Other intervertebral disc degeneration, lumbar region: Secondary | ICD-10-CM | POA: Diagnosis not present

## 2020-11-04 DIAGNOSIS — E1165 Type 2 diabetes mellitus with hyperglycemia: Secondary | ICD-10-CM | POA: Insufficient documentation

## 2020-11-04 DIAGNOSIS — M545 Low back pain, unspecified: Secondary | ICD-10-CM | POA: Diagnosis not present

## 2020-11-04 DIAGNOSIS — M25572 Pain in left ankle and joints of left foot: Secondary | ICD-10-CM | POA: Diagnosis not present

## 2020-11-04 DIAGNOSIS — T2101XA Burn of unspecified degree of chest wall, initial encounter: Secondary | ICD-10-CM | POA: Diagnosis not present

## 2020-11-04 DIAGNOSIS — M25519 Pain in unspecified shoulder: Secondary | ICD-10-CM | POA: Diagnosis not present

## 2020-11-04 LAB — CBC
HCT: 40.9 % (ref 36.0–46.0)
Hemoglobin: 13.7 g/dL (ref 12.0–15.0)
MCH: 31.2 pg (ref 26.0–34.0)
MCHC: 33.5 g/dL (ref 30.0–36.0)
MCV: 93.2 fL (ref 80.0–100.0)
Platelets: 200 10*3/uL (ref 150–400)
RBC: 4.39 MIL/uL (ref 3.87–5.11)
RDW: 13.2 % (ref 11.5–15.5)
WBC: 10.1 10*3/uL (ref 4.0–10.5)
nRBC: 0 % (ref 0.0–0.2)

## 2020-11-04 LAB — CBG MONITORING, ED
Glucose-Capillary: 398 mg/dL — ABNORMAL HIGH (ref 70–99)
Glucose-Capillary: 311 mg/dL — ABNORMAL HIGH (ref 70–99)

## 2020-11-04 LAB — BETA-HYDROXYBUTYRIC ACID: Beta-Hydroxybutyric Acid: 0.09 mmol/L (ref 0.05–0.27)

## 2020-11-04 LAB — BASIC METABOLIC PANEL
Anion gap: 7 (ref 5–15)
BUN: 47 mg/dL — ABNORMAL HIGH (ref 8–23)
CO2: 24 mmol/L (ref 22–32)
Calcium: 10.1 mg/dL (ref 8.9–10.3)
Chloride: 102 mmol/L (ref 98–111)
Creatinine, Ser: 1.85 mg/dL — ABNORMAL HIGH (ref 0.44–1.00)
GFR, Estimated: 27 mL/min — ABNORMAL LOW (ref >60)
Glucose, Bld: 413 mg/dL — ABNORMAL HIGH (ref 70–99)
Potassium: 4.5 mmol/L (ref 3.5–5.1)
Sodium: 133 mmol/L — ABNORMAL LOW (ref 135–145)

## 2020-11-04 MED ORDER — SODIUM CHLORIDE 0.9 % IV BOLUS
1000.0000 mL | Freq: Once | INTRAVENOUS | Status: AC
  Administered 2020-11-04: 1000 mL via INTRAVENOUS

## 2020-11-04 MED ORDER — INSULIN GLARGINE 100 UNIT/ML ~~LOC~~ SOLN
10.0000 [IU] | Freq: Once | SUBCUTANEOUS | Status: AC
  Administered 2020-11-04: 10 [IU] via SUBCUTANEOUS
  Filled 2020-11-04: qty 0.1

## 2020-11-04 NOTE — Telephone Encounter (Signed)
Patient had an appointment this AM did not take insulin this am until just taken 12:20 at [facility] at 382 right before insulin. Patient taking prednisone 4 mg at 4 tablet today tapering.

## 2020-11-04 NOTE — Discharge Instructions (Addendum)
Discontinue prednisone at home.

## 2020-11-04 NOTE — Telephone Encounter (Signed)
Left message for patient to return call to office. 

## 2020-11-04 NOTE — ED Triage Notes (Signed)
Pt to ED POV for hyperglycemia since starting prednisone 3 days ago.  Taking prednisone for pain in hip, dx bursitis  Cbg 398

## 2020-11-04 NOTE — Telephone Encounter (Signed)
Patient CBG is 527 took 8 units of basaglar at 12:20 see previous note.

## 2020-11-04 NOTE — Telephone Encounter (Signed)
Patient has been seen by ED 11/04/20

## 2020-11-04 NOTE — Telephone Encounter (Signed)
Noted. I spoke with Kelly [name] regarding this. Given that significant elevation she needs to be seen in the ED to assist in lowering her glucose. I advised that she discontinue the steroid and not take steroids in the future unless absolutely necessary. Kelly [name] is to relay this to the patient.

## 2020-11-04 NOTE — Telephone Encounter (Addendum)
Spoke with PCP received verbal to have patient stop prednisone taper and to go to ER for home CBG of [phone] called and advised patient as ordered and advised patient  to have some one drive her to the ER. Patient agreed,Called and notified ER nurse patient in route and of CBG readings.

## 2020-11-04 NOTE — ED Provider Notes (Signed)
[facility]-EMERGENCY DEPARTMENT  ____________________________________________  Time seen: Approximately 5:59 PM  I have reviewed the triage vital signs and the nursing notes.   HISTORY  Chief Complaint Hyperglycemia   Historian Patient     HPI Kelly [name] is a 80 y.o. female with a history of recent prednisone use, presents to the emergency department with hyperglycemia.  Patient was advised by her PCP to discontinue prednisone.  Patient was taking prednisone for bursitis.  Patient takes insulin glargine and liraglutide at home.  She denies fever, chills, weakness, chest pain, chest tightness, shortness of breath, abdominal pain or headache.  No other alleviating measures have been attempted.   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      Immunizations up to date:  Yes.     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     Patient Active Problem List   Diagnosis Date Noted   Rash 09/29/2020   Onychomycosis 08/11/2020   Sleeping difficulty 08/11/2020   Allergic rhinitis 08/11/2020   CPAP use counseling 06/01/2020   Dysuria 03/02/2020   Edema of lower extremity 11/19/2019   Hyperlipidemia 11/15/2019   Low back pain 11/15/2019   Dry eyes 10/01/2019   Tick bite 10/01/2019   Vomiting 10/01/2019   Personal history of other specified conditions 07/03/2019   Fall 04/15/2019   Type 2 diabetes mellitus ([place]) 03/18/2019   Hypertension 03/18/2019   Hyperparathyroidism due to  renal insufficiency ([place]) 03/18/2019   Headache disorder 09/10/2018   Cervico-occipital neuralgia 07/23/2018   Osteoporosis 07/23/2018   Cramp and spasm 02/12/2018   Paresthesia of hand 10/25/2017   Lesion of rectum 09/07/2017   Loss of height 08/15/2017   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   Cervical spondylosis without myelopathy 08/15/2017   DDD (degenerative disc disease), cervical 08/15/2017   Bursitis of hip 08/15/2017   Female stress incontinence 08/15/2017   Muscle weakness 08/15/2017   Neck pain 08/15/2017   Spondylolisthesis, congenital 08/15/2017   Radial styloid tenosynovitis 08/15/2017   Senile osteoporosis 08/15/2017   Hip pain 08/15/2017   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   Rectal hemorrhage 07/25/2017   Essential tremor 04/26/2017   Other fatigue 03/22/2017   Left ventricular hypertrophy 10/12/2016   Chronic kidney disease, stage 4 (severe) ([place]) 08/26/2016   OSA on CPAP 08/26/2016    Past Surgical History:  Procedure Laterality Date   APPENDECTOMY     BREAST BIOPSY Right 06/19/2018   affirm stereo/x clip/COLUMNAR CELL CHANGE WITH MICROCALCIFICATIONS. FIBROCYSTIC CHANGES WITH MICROCALCIFICATIONS   cataract  03/2017   CHOLECYSTECTOMY     COLONOSCOPY     COLONOSCOPY WITH PROPOFOL N/A 08/22/2017   Procedure: COLONOSCOPY WITH PROPOFOL;  Surgeon: Jonathon Bellows, MD;  Location: [facility] ENDOSCOPY;  Service: Gastroenterology;  Laterality: N/A;   ESOPHAGOGASTRODUODENOSCOPY     ESOPHAGOGASTRODUODENOSCOPY (EGD) WITH PROPOFOL N/A 03/23/2016   Procedure: ESOPHAGOGASTRODUODENOSCOPY (  EGD) WITH PROPOFOL;  Surgeon: Manya Silvas, MD;  Location: [facility] ENDOSCOPY;  Service: Endoscopy;  Laterality: N/A;   FLEXIBLE SIGMOIDOSCOPY N/A 02/06/2018   Procedure: FLEXIBLE SIGMOIDOSCOPY;  Surgeon: Jonathon Bellows, MD;  Location: [facility] ENDOSCOPY;  Service: Gastroenterology;  Laterality: N/A;   FLEXIBLE SIGMOIDOSCOPY N/A 12/03/2018   Procedure: FLEXIBLE SIGMOIDOSCOPY;  Surgeon: Jonathon Bellows, MD;  Location: [facility] ENDOSCOPY;  Service: Gastroenterology;  Laterality: N/A;   RECTAL PROLAPSE REPAIR  2012   x2    Prior to Admission medications   Medication Sig Start Date End Date Taking? Authorizing Provider  amLODipine (NORVASC) 5 MG tablet Take 1 tablet (5 mg total) by mouth daily. 11/28/19   [name], MD  aspirin EC 81 MG tablet Take 81 mg by mouth daily.    [provider]  atorvastatin (LIPITOR) 80 MG tablet Take 1 tablet (80 mg total) by mouth daily. 10/14/20   [name], MD  BD PEN NEEDLE NANO U/F 32G X 4 MM MISC USE DAILY WITH VICTOZA AND BASAGLAR 10/06/20   [name], MD  bisacodyl (DULCOLAX) 5 MG EC tablet Take 5 mg by mouth daily as needed for moderate constipation.    [provider]  calcitRIOL (ROCALTROL) 0.25 MCG capsule Take 0.25 mcg by mouth daily.    [provider]  Calcium Citrate-Vitamin D (CALCIUM CITRATE +D PO) Take 600 mg by mouth daily.     [provider]  Cholecalciferol (VITAMIN D) 50 MCG (2000 UT) CAPS Take 2,000 Units by mouth daily.     [provider]  ciclopirox (PENLAC) 8 % solution Apply topically at bedtime. Apply over nail and surrounding skin. Apply daily over previous coat. After seven (7) days, may remove with alcohol and continue cycle. Patient not taking: Reported on 10/14/2020 08/11/20   [name], MD  Continuous Blood Gluc Sensor (FREESTYLE LIBRE 2 SENSOR) MISC Use to check sugar at least 4 times daily 01/02/20   [name], MD  cyclobenzaprine (FLEXERIL) 10 MG tablet TAKE 1/2 TABLET (5MG  TOTAL)3 TIMES A DAY AS NEEDED    FOR MUSCLE SPASMS 01/28/20   [name], MD  denosumab (PROLIA) 60 MG/ML SOSY injection Inject 60 mg into the skin every 6 (six) months.    [provider]  DULoxetine 40 MG CPEP Take 40 mg by mouth daily. 08/14/20   [name], MD  furosemide (LASIX) 40 MG tablet Take 1 tablet (40 mg total) by mouth daily as  needed. Patient not taking: Reported on 10/14/2020 03/04/20   [name], MD  gabapentin (NEURONTIN) 100 MG capsule Take 1 capsule (100 mg total) by mouth 2 (two) times daily. 04/01/20   [name], MD  glucosamine-chondroitin 500-400 MG tablet Take 1 tablet by mouth 3 (three) times daily.    [provider]  glucose blood (ACCU-CHEK AVIVA PLUS) test strip Use to check blood sugar up to 4 times daily 07/30/18   [name], MD  HYDROcodone-acetaminophen (NORCO/VICODIN) 5-325 MG tablet     [provider]  Insulin Glargine (BASAGLAR KWIKPEN) 100 UNIT/ML Inject 8 Units into the skin daily. 09/29/20   [name], MD  Lancets MISC Use up to 4 times daily to check blood sugars. 11/04/16   Coral Spikes, DO  liraglutide (VICTOZA) 18 MG/3ML SOPN Inject 1.2 mg into the skin daily. 07/17/20   [name], MD  Loratadine 5 MG TBDP Take 1 tablet (5 mg total) by mouth  daily. Patient not taking: Reported on 10/14/2020 08/11/20   [name], MD  losartan (COZAAR) 50 MG tablet TAKE 1 TABLET DAILY 05/18/20   [name], MD  Melatonin 5 MG CHEW Chew 5 mg by mouth daily.    [provider]  potassium chloride (K-DUR) 10 MEQ tablet     [provider]  triamcinolone ointment (KENALOG) 0.5 % Apply 1 application topically 2 (two) times daily. 09/30/20   [name], MD    Allergies Prilosec otc [omeprazole magnesium], Sucralfate, Amoxicillin, and Morphine and related  Family History  Problem Relation Age of Onset   Stroke Mother    Arthritis Mother    Aortic aneurysm Sister    Lung cancer Sister    Heart attack Father 72   Breast cancer Sister 2    Social History Social History   Tobacco Use   Smoking status: Never   Smokeless tobacco: Never  Vaping Use   Vaping Use: Never used  Substance Use Topics   Alcohol use: No   Drug use: No     Review of Systems  Constitutional: No fever/chills Eyes:  No  discharge ENT: No upper respiratory complaints. Respiratory: no cough. No SOB/ use of accessory muscles to breath Gastrointestinal:   No nausea, no vomiting.  No diarrhea.  No constipation. Musculoskeletal: Negative for musculoskeletal pain. Skin: Negative for rash, abrasions, lacerations, ecchymosis.    ____________________________________________   PHYSICAL EXAM:  VITAL SIGNS: ED Triage Vitals  Enc Vitals Group     BP 11/04/20 1537 (!) 156/75     Pulse Rate 11/04/20 1537 80     Resp 11/04/20 1537 18     Temp 11/04/20 1539 98 F (36.7 C)     Temp Source 11/04/20 1536 Oral     SpO2 11/04/20 1537 97 %     Weight 11/04/20 1536 126 lb (57.2 kg)     Height 11/04/20 1536 5\' 4"  (1.626 m)     Head Circumference --      Peak Flow --      Pain Score 11/04/20 1538 5     Pain Loc --      Pain Edu? --      Excl. in [place]? --      Constitutional: Alert and oriented. Well appearing and in no acute distress. Eyes: Conjunctivae are normal. PERRL. EOMI. Head: Atraumatic. ENT:      Nose: No congestion/rhinnorhea.      Mouth/Throat: Mucous membranes are moist.  Neck: No stridor.  No cervical spine tenderness to palpation. Cardiovascular: Normal rate, regular rhythm. Normal S1 and S2.  Good peripheral circulation. Respiratory: Normal respiratory effort without tachypnea or retractions. Lungs CTAB. Good air entry to the bases with no decreased or absent breath sounds Gastrointestinal: Bowel sounds x 4 quadrants. Soft and nontender to palpation. No guarding or rigidity. No distention. Musculoskeletal: Full range of motion to all extremities. No obvious deformities noted Neurologic:  Normal for age. No gross focal neurologic deficits are appreciated.  Skin:  Skin is warm, dry and intact. No rash noted. Psychiatric: Mood and affect are normal for age. Speech and behavior are normal.   ____________________________________________   LABS (all labs ordered are listed, but only abnormal results  are displayed)  Labs Reviewed  BASIC METABOLIC PANEL - Abnormal; Notable for the following components:      Result Value   Sodium 133 (*)    Glucose, Bld 413 (*)    BUN 47 (*)  Creatinine, Ser 1.85 (*)    GFR, Estimated 27 (*)    All other components within normal limits  CBG MONITORING, ED - Abnormal; Notable for the following components:   Glucose-Capillary 398 (*)    All other components within normal limits  CBG MONITORING, ED - Abnormal; Notable for the following components:   Glucose-Capillary 311 (*)    All other components within normal limits  CBC  BETA-HYDROXYBUTYRIC ACID  URINALYSIS, COMPLETE (UACMP) WITH MICROSCOPIC  CBG MONITORING, ED   ____________________________________________  EKG   ____________________________________________  RADIOLOGY   No results found.  ____________________________________________    PROCEDURES  Procedure(s) performed:     Procedures     Medications  sodium chloride 0.9 % bolus 1,000 mL (0 mLs Intravenous Stopped 11/04/20 1838)  insulin glargine (LANTUS) injection 10 Units (10 Units Subcutaneous Given 11/04/20 1838)     ____________________________________________   INITIAL IMPRESSION / ASSESSMENT AND PLAN / ED COURSE  Pertinent labs & imaging results that were available during my care of the patient were reviewed by me and considered in my medical decision making (see chart for details).      Assessment and plan: Hyperglycemia:  80 year old female presents to the emergency department with acute hyperglycemia after starting prednisone.  Patient was on day 4 of prednisone.  Patient was hypertensive at triage but vital signs were otherwise reassuring.  Patient's initial blood glucose was 413 and trended down to 311 after a liter of normal saline and 10 units of Lantus was given in the emergency department.  Recommended discontinuing prednisone and increasing hydration.  Patient felt comfortable being  discharged from the emergency department.  She has no other constitutional symptoms at this time.    ____________________________________________  FINAL CLINICAL IMPRESSION(S) / ED DIAGNOSES  Final diagnoses:  Hyperglycemia      NEW MEDICATIONS STARTED DURING THIS VISIT:  ED Discharge Orders     None           This chart was dictated using voice recognition software/Dragon. Despite best efforts to proofread, errors can occur which can change the meaning. Any change was purely unintentional.     Lannie Fields, PA-C 11/04/20 [place]    Duffy Bruce, MD 11/08/20 1531

## 2020-11-05 ENCOUNTER — Telehealth: Payer: Self-pay

## 2020-11-05 NOTE — Telephone Encounter (Signed)
Pt called and asked to speak with you. She states that you spoke with her daughter yesterday and mentioned moving pt's appt from 12/02/20 up to a sooner date. Please advise

## 2020-11-05 NOTE — Telephone Encounter (Signed)
I called the patient and informed her that she has a office visit on 12/02/2020 and I asked if she needed it sooner and she stated she did not that 12/02/2020 was ok and the appointment was left at that date.  Lenay Lovejoy,cma

## 2020-11-06 ENCOUNTER — Telehealth: Payer: Self-pay | Admitting: Family Medicine

## 2020-11-06 DIAGNOSIS — M25551 Pain in right hip: Secondary | ICD-10-CM | POA: Diagnosis not present

## 2020-11-06 NOTE — Telephone Encounter (Signed)
Spoke with patient and advised since [facility] DX her back she call their office or go to the walkin clinic this afternoon at [facility] for evaluation of her pain , patient stated she would go there.

## 2020-11-06 NOTE — Telephone Encounter (Signed)
Agree with advice given

## 2020-11-06 NOTE — Telephone Encounter (Signed)
PT called to see if Dr.Sonnenberg can call her in something for the pain she is having. Her Orthopedist told her its her back not her hip that she thought it was hurting her.

## 2020-11-09 DIAGNOSIS — Z20822 Contact with and (suspected) exposure to covid-19: Secondary | ICD-10-CM | POA: Diagnosis not present

## 2020-11-10 DIAGNOSIS — T2101XA Burn of unspecified degree of chest wall, initial encounter: Secondary | ICD-10-CM | POA: Diagnosis not present

## 2020-11-10 DIAGNOSIS — M5032 Other cervical disc degeneration, mid-cervical region, unspecified level: Secondary | ICD-10-CM | POA: Diagnosis not present

## 2020-11-10 DIAGNOSIS — Z79899 Other long term (current) drug therapy: Secondary | ICD-10-CM | POA: Diagnosis not present

## 2020-11-10 DIAGNOSIS — M7062 Trochanteric bursitis, left hip: Secondary | ICD-10-CM | POA: Diagnosis not present

## 2020-11-10 DIAGNOSIS — M25572 Pain in left ankle and joints of left foot: Secondary | ICD-10-CM | POA: Diagnosis not present

## 2020-11-10 DIAGNOSIS — M5136 Other intervertebral disc degeneration, lumbar region: Secondary | ICD-10-CM | POA: Diagnosis not present

## 2020-11-10 DIAGNOSIS — M6281 Muscle weakness (generalized): Secondary | ICD-10-CM | POA: Diagnosis not present

## 2020-11-10 DIAGNOSIS — Q762 Congenital spondylolisthesis: Secondary | ICD-10-CM | POA: Diagnosis not present

## 2020-11-10 DIAGNOSIS — E1165 Type 2 diabetes mellitus with hyperglycemia: Secondary | ICD-10-CM | POA: Diagnosis not present

## 2020-11-10 DIAGNOSIS — M25519 Pain in unspecified shoulder: Secondary | ICD-10-CM | POA: Diagnosis not present

## 2020-11-10 DIAGNOSIS — M810 Age-related osteoporosis without current pathological fracture: Secondary | ICD-10-CM | POA: Diagnosis not present

## 2020-11-10 DIAGNOSIS — M25559 Pain in unspecified hip: Secondary | ICD-10-CM | POA: Diagnosis not present

## 2020-11-12 DIAGNOSIS — M25572 Pain in left ankle and joints of left foot: Secondary | ICD-10-CM | POA: Diagnosis not present

## 2020-11-12 DIAGNOSIS — M7062 Trochanteric bursitis, left hip: Secondary | ICD-10-CM | POA: Diagnosis not present

## 2020-11-12 DIAGNOSIS — M810 Age-related osteoporosis without current pathological fracture: Secondary | ICD-10-CM | POA: Diagnosis not present

## 2020-11-12 DIAGNOSIS — Z79899 Other long term (current) drug therapy: Secondary | ICD-10-CM | POA: Diagnosis not present

## 2020-11-12 DIAGNOSIS — T2101XA Burn of unspecified degree of chest wall, initial encounter: Secondary | ICD-10-CM | POA: Diagnosis not present

## 2020-11-12 DIAGNOSIS — M25519 Pain in unspecified shoulder: Secondary | ICD-10-CM | POA: Diagnosis not present

## 2020-11-12 DIAGNOSIS — M5032 Other cervical disc degeneration, mid-cervical region, unspecified level: Secondary | ICD-10-CM | POA: Diagnosis not present

## 2020-11-12 DIAGNOSIS — M5136 Other intervertebral disc degeneration, lumbar region: Secondary | ICD-10-CM | POA: Diagnosis not present

## 2020-11-12 DIAGNOSIS — M25559 Pain in unspecified hip: Secondary | ICD-10-CM | POA: Diagnosis not present

## 2020-11-12 DIAGNOSIS — M6281 Muscle weakness (generalized): Secondary | ICD-10-CM | POA: Diagnosis not present

## 2020-11-12 DIAGNOSIS — E1165 Type 2 diabetes mellitus with hyperglycemia: Secondary | ICD-10-CM | POA: Diagnosis not present

## 2020-11-12 DIAGNOSIS — Q762 Congenital spondylolisthesis: Secondary | ICD-10-CM | POA: Diagnosis not present

## 2020-11-17 ENCOUNTER — Other Ambulatory Visit: Payer: Self-pay | Admitting: Family Medicine

## 2020-11-17 DIAGNOSIS — D1723 Benign lipomatous neoplasm of skin and subcutaneous tissue of right leg: Secondary | ICD-10-CM | POA: Diagnosis not present

## 2020-11-17 DIAGNOSIS — M25559 Pain in unspecified hip: Secondary | ICD-10-CM | POA: Diagnosis not present

## 2020-11-17 DIAGNOSIS — N184 Chronic kidney disease, stage 4 (severe): Secondary | ICD-10-CM

## 2020-11-18 DIAGNOSIS — M4727 Other spondylosis with radiculopathy, lumbosacral region: Secondary | ICD-10-CM | POA: Diagnosis not present

## 2020-11-18 DIAGNOSIS — M4316 Spondylolisthesis, lumbar region: Secondary | ICD-10-CM | POA: Diagnosis not present

## 2020-11-18 DIAGNOSIS — M48061 Spinal stenosis, lumbar region without neurogenic claudication: Secondary | ICD-10-CM | POA: Diagnosis not present

## 2020-11-18 DIAGNOSIS — M545 Low back pain, unspecified: Secondary | ICD-10-CM | POA: Diagnosis not present

## 2020-11-18 DIAGNOSIS — M4726 Other spondylosis with radiculopathy, lumbar region: Secondary | ICD-10-CM | POA: Diagnosis not present

## 2020-11-18 DIAGNOSIS — M4317 Spondylolisthesis, lumbosacral region: Secondary | ICD-10-CM | POA: Diagnosis not present

## 2020-11-18 DIAGNOSIS — M4807 Spinal stenosis, lumbosacral region: Secondary | ICD-10-CM | POA: Diagnosis not present

## 2020-11-18 DIAGNOSIS — M5117 Intervertebral disc disorders with radiculopathy, lumbosacral region: Secondary | ICD-10-CM | POA: Diagnosis not present

## 2020-11-18 DIAGNOSIS — M5116 Intervertebral disc disorders with radiculopathy, lumbar region: Secondary | ICD-10-CM | POA: Diagnosis not present

## 2020-11-23 DIAGNOSIS — M545 Low back pain, unspecified: Secondary | ICD-10-CM | POA: Diagnosis not present

## 2020-11-25 ENCOUNTER — Ambulatory Visit (INDEPENDENT_AMBULATORY_CARE_PROVIDER_SITE_OTHER): Payer: Medicare Other | Admitting: *Deleted

## 2020-11-25 ENCOUNTER — Other Ambulatory Visit: Payer: Self-pay | Admitting: Family Medicine

## 2020-11-25 DIAGNOSIS — Z794 Long term (current) use of insulin: Secondary | ICD-10-CM | POA: Diagnosis not present

## 2020-11-25 DIAGNOSIS — N184 Chronic kidney disease, stage 4 (severe): Secondary | ICD-10-CM | POA: Diagnosis not present

## 2020-11-25 DIAGNOSIS — I10 Essential (primary) hypertension: Secondary | ICD-10-CM

## 2020-11-25 DIAGNOSIS — E1122 Type 2 diabetes mellitus with diabetic chronic kidney disease: Secondary | ICD-10-CM | POA: Diagnosis not present

## 2020-11-25 NOTE — Patient Instructions (Signed)
Visit Information  PATIENT GOALS:  Goals Addressed             This Visit's Progress    (CCM RNCM) Monitor and Manage My Blood Sugar   On track    Timeframe:  Long-Range Goal Priority:  Medium Start Date:  06/16/20                           Expected End Date:  05/07/21                     Follow Up Date 12/21/20   Check blood sugar at prescribed times (at least 4 times a day)  Check blood sugar if I feel it is too high or too low, calling provider for sustained highs or lows Enter blood sugar readings and medication/insulin into daily log taking to provider for review Continue to keep food diary notating what foods elevate blood sugars Try to increase fruits and vegetables in diet (eat them daily) Take medication as prescribed Current Hgb A1C is increased 7.8 with goal being below 7 Make sure you eat a bedtime snack nightly to help prevent morning hypoglycemia   Why is this important?   Checking your blood sugar at home helps to keep it from getting very high or very low.  Writing the results in a diary or log helps the doctor know how to care for you.  Your blood sugar log should have the time, date and the results.  Also, write down the amount of insulin or other medicine that you take.  Other information, like what you ate, exercise done and how you were feeling, will also be helpful.       (CCM RNCM) Track and Manage My Blood Pressure-Hypertension   On track    Timeframe:  Long-Range Goal Priority:  Medium Start Date: 06/16/20                            Expected End Date:  05/07/21                     Follow Up Date 12/21/20    Check blood pressure at least weekly Write blood pressure results in a log or diary and take to medical appointments Keep log near medications and blood pressure machine to remind yourself to write down vitals in log Continue to monitor self for signs and symptoms of high blood pressure (swelling in extremities, headache, etc) Increase activity as  tolerated  Continue with outpatient therapy   Why is this important?   You won't feel high blood pressure, but it can still hurt your blood vessels.  High blood pressure can cause heart or kidney problems. It can also cause a stroke.  Making lifestyle changes like losing a little weight or eating less salt will help.  Checking your blood pressure at home and at different times of the day can help to control blood pressure.  If the doctor prescribes medicine remember to take it the way the doctor ordered.  Call the office if you cannot afford the medicine or if there are questions about it.          Patient verbalizes understanding of instructions provided today and agrees to view in [place].   The care management team will reach out to the patient again over the next 30 business days.   Hubert Azure RN, MSN  RN Care Management Coordinator [place] [phone] [email or web address]

## 2020-11-25 NOTE — Chronic Care Management (AMB) (Signed)
Chronic Care Management   CCM RN Visit Note  11/25/2020 Name: Kelly [name] MRN: [id number] DOB: Aug 27, 1940  Subjective: Kelly [name] is a 80 y.o. year old female who is a primary care patient of Caryl Bis, Angela Adam, MD. The care management team was consulted for assistance with disease management and care coordination needs.    Engaged with patient by telephone for follow up visit in response to provider referral for case management and/or care coordination services.   Consent to Services:  The patient was given information about Chronic Care Management services, agreed to services, and gave verbal consent prior to initiation of services.  Please see initial visit note for detailed documentation.   Patient agreed to services and verbal consent obtained.   Assessment: Review of patient past medical history, allergies, medications, health status, including review of consultants reports, laboratory and other test data, was performed as part of comprehensive evaluation and provision of chronic care management services.   SDOH (Social Determinants of Health) assessments and interventions performed:    CCM Care Plan  Allergies  Allergen Reactions   Prilosec Otc [Omeprazole Magnesium] Other (See Comments)    Maybe cause of acute interstitial nephritis   Sucralfate Other (See Comments)    Maybe cause of acute interstitial nephritis   Amoxicillin Diarrhea   Morphine And Related Other (See Comments)    Chest pains Chest pains    Outpatient Encounter Medications as of 11/25/2020  Medication Sig Note   Insulin Glargine (BASAGLAR KWIKPEN) 100 UNIT/ML Inject 8 Units into the skin daily. 11/25/2020: Reports taking 9 units daily   liraglutide (VICTOZA) 18 MG/3ML SOPN Inject 1.2 mg into the skin daily. 10/14/2020: 0.6 mg daily   amLODipine (NORVASC) 5 MG tablet Take 1 tablet (5 mg total) by mouth daily.    aspirin EC 81 MG tablet Take 81 mg by mouth daily.    atorvastatin  (LIPITOR) 80 MG tablet Take 1 tablet (80 mg total) by mouth daily.    BD PEN NEEDLE NANO U/F 32G X 4 MM MISC USE DAILY WITH VICTOZA AND BASAGLAR    bisacodyl (DULCOLAX) 5 MG EC tablet Take 5 mg by mouth daily as needed for moderate constipation.    calcitRIOL (ROCALTROL) 0.25 MCG capsule Take 0.25 mcg by mouth daily.    Calcium Citrate-Vitamin D (CALCIUM CITRATE +D PO) Take 600 mg by mouth daily.     Cholecalciferol (VITAMIN D) 50 MCG (2000 UT) CAPS Take 2,000 Units by mouth daily.     ciclopirox (PENLAC) 8 % solution Apply topically at bedtime. Apply over nail and surrounding skin. Apply daily over previous coat. After seven (7) days, may remove with alcohol and continue cycle. (Patient not taking: Reported on 10/14/2020)    Continuous Blood Gluc Sensor (FREESTYLE LIBRE 2 SENSOR) MISC USE TO CHECK SUGAR AT LEAST4 TIMES A DAY    cyclobenzaprine (FLEXERIL) 10 MG tablet TAKE 1/2 TABLET (5MG  TOTAL)3 TIMES A DAY AS NEEDED    FOR MUSCLE SPASMS    denosumab (PROLIA) 60 MG/ML SOSY injection Inject 60 mg into the skin every 6 (six) months.    DULoxetine 40 MG CPEP Take 40 mg by mouth daily.    furosemide (LASIX) 40 MG tablet Take 1 tablet (40 mg total) by mouth daily as needed. (Patient not taking: Reported on 10/14/2020)    gabapentin (NEURONTIN) 100 MG capsule Take 1 capsule (100 mg total) by mouth 2 (two) times daily. 05/20/2020: Taking QPM   glucosamine-chondroitin 500-400 MG tablet Take 1  tablet by mouth 3 (three) times daily. 06/16/2020: Reports taking once a day   glucose blood (ACCU-CHEK AVIVA PLUS) test strip Use to check blood sugar up to 4 times daily    HYDROcodone-acetaminophen (NORCO/VICODIN) 5-325 MG tablet     Lancets MISC Use up to 4 times daily to check blood sugars.    Loratadine 5 MG TBDP Take 1 tablet (5 mg total) by mouth daily. (Patient not taking: Reported on 10/14/2020)    losartan (COZAAR) 50 MG tablet TAKE 1 TABLET DAILY    Melatonin 5 MG CHEW Chew 5 mg by mouth daily.    potassium  chloride (K-DUR) 10 MEQ tablet  (Patient not taking: Reported on 10/14/2020) 11/06/2017: Only takes when she takes furosemide   triamcinolone ointment (KENALOG) 0.5 % Apply 1 application topically 2 (two) times daily.    No facility-administered encounter medications on file as of 11/25/2020.    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   Headache disorder 09/10/2018   Cervico-occipital neuralgia 07/23/2018   Osteoporosis 07/23/2018   Cramp and spasm 02/12/2018   Paresthesia of hand 10/25/2017   Lesion of rectum 09/07/2017   Loss of height 08/15/2017   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   Cervical spondylosis without myelopathy 08/15/2017   DDD (degenerative disc disease), cervical 08/15/2017   Bursitis of hip 08/15/2017   Female stress incontinence 08/15/2017   Muscle weakness 08/15/2017   Neck pain 08/15/2017   Spondylolisthesis, congenital 08/15/2017   Radial styloid tenosynovitis 08/15/2017   Senile osteoporosis 08/15/2017   Hip pain 08/15/2017   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   Rectal hemorrhage 07/25/2017   Essential tremor 04/26/2017   Other fatigue 03/22/2017   Left ventricular hypertrophy 10/12/2016   Chronic kidney disease, stage 4 (severe) ([place]) 08/26/2016   OSA on CPAP 08/26/2016    Conditions to be addressed/monitored:HTN and DMII  Care Plan : Diabetes Type 2 (Adult)  Updates made by Leona Singleton, RN since 11/25/2020 12:00 AM     Problem: Glycemic Management (Diabetes, Type 2)   Priority: Medium     Long-Range Goal: Patient will report decrease in Hgb A1C by 0.2 points within the next 90 days   Start Date: 06/16/2020  Expected End Date: 05/07/2021  This Visit's Progress: On track  Recent Progress: On track  Priority: Medium  Note:   Objective:  Lab Results  Component Value Date   HGBA1C 7.8 (H) 08/11/2020  Current Barriers:  Knowledge Deficits related to basic Diabetes pathophysiology and self care/management as evidenced by increasing Hgb A1C.  Reports continued use of her Freestyle [place] continuous monitoring system.  Fasting blood sugar this morning was 105 with recent fasting ranges in 100-120's.  States blood sugars have been much better since stopping steroids for her hip/back pain. Case Manager Clinical Goal(s):  Patient will demonstrate improved adherence to prescribed treatment plan for diabetes self care/management as evidenced by: Four times a day monitoring and  recording of CBG, adherence to ADA/ carb modified diet, exercise at least 3 days/week, adherence to prescribed medication regimen Interventions:  Collaboration with [name], MD regarding development and update of comprehensive plan of care as evidenced by provider attestation and co-signature Inter-disciplinary care team collaboration (see longitudinal plan of care) Reviewed medications with patient and medication changes and discussed importance of medication adherence Discussed plans with patient for ongoing care management follow up and provided patient with direct contact information for care management team Discussed signs and symptoms of hypo and hyperglycemia and importance of correct treatment (not to 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   Discussed food diary and carbohydrate modified diet, encouraged to continue to keep food diary taking to follow up appointments, encouraged healthier food options and increasing vegetable intake Discussed carbohydrate counting and diabetic diet, sent EMMI Diabetes Diet Video; encouraged to view Sent Diabetes Diet and Carbohydrate Counting handouts; encouraged to review Encouraged to continue to work with CCM Pharmacist and to contact CCM Pharmacist for sustained hyperglycemic episodes for possible medication adjustments Discussed appropriate meal portions and how to use food diary with making good food choices Discussed increasing Hgb A1C and ways to help reduce Discussed and encouraged eating bedtime snack nightly to help prevent early morning hypoglycemia Reviewed recent blood sugar values and congratulated on current reduction of ranges Discussed how steroids can affect blood sugars and encouraged patient to monitor after steroid injection Patient Goals/Self-Care Activities Over the next 30 days, patient will:  Check blood sugar at prescribed times (at least 4 times a day)  Check blood sugar if I feel it is too high or too low, calling provider for sustained highs or lows Enter blood sugar readings and medication/insulin into daily log taking to provider for review Continue to keep food diary notating what foods elevate blood sugars Try to increase fruits and vegetables in diet (eat them daily) Take medication as prescribed Current Hgb A1C is increased 7.8 with goal being below 7 Make sure you eat a bedtime snack nightly to help prevent morning hypoglycemia Follow Up Plan: The care management team will reach out to the patient again over the next 30 business  days.      Care Plan : Hypertension (Adult)  Updates made by Leona Singleton, RN since 11/25/2020 12:00 AM     Problem: Hypertension (Hypertension)   Priority: Medium     Long-Range Goal: Hypertension Monitored   Start Date: 06/16/2020  Expected End Date: 12/13/2020  This Visit's Progress: On track  Recent Progress: On track  Priority: Medium  Note:   Current Barriers:  Knowledge Deficits related to basic understanding of hypertension pathophysiology and self care management; has been monitoring blood pressures at home she says about once a week at least.  Reports pressures have been within range.  Continues to report severe back/hip pain.  States she is going to have epidural steroid injection tomorrow.  Will have family available to stay the night with her.  Denies any falls but admits mobility is limited due to pain. Nurse Case Manager Clinical Goal(s):  Over the next 90 days, patient will verbalize understanding of plan for hypertension  management Over the next 90 days, patient will attend all scheduled medical appointments Over the next 90 days, patient will demonstrate improved adherence to prescribed treatment plan for hypertension as evidenced by taking all medications as prescribed, monitoring and recording blood pressure as directed, adhering to low sodium/DASH diet Interventions:  Collaboration with [name], MD regarding development and update of comprehensive plan of care as evidenced by provider attestation and co-signature Inter-disciplinary care team collaboration (see longitudinal plan of care) Evaluation of current treatment plan related to hypertension self management and patient's adherence to plan as established by provider. Reviewed medications with patient and discussed importance of compliance Discussed plans with patient for ongoing care management follow up and provided patient with direct contact information for care management team Advised patient,  providing education and rationale, to monitor blood pressure daily and record, calling PCP for findings outside established parameters.  Reviewed scheduled/upcoming provider appointments including: encouraged to contact PCP if rash does not clear in the next 2 weeks Discussed sleep and sleep pattern and encouraged patient to stay awake most of the day, avoid cell phone/screen time at bedtime, trying to decrease stimuli at bedtime Instructed and encouraged patient to place Calendar near medications or blood pressure cuff for ease of logging pressures when she takes them Encouraged to increase activity as tolerated, fall precautions and preventions reviewed and discussed Reviewed signs and symptoms of hypertension and encouraged patient to continue to self monitor Again discussed and encouraged to use 2022 [place] to log vital signs for provider review Encouraged to attend appointment for spinal injection Emotional support and empathy provided to patient Patient Goals/Self-Care Activities:  Over the next 30 days, patient will: Check blood pressure at least weekly Write blood pressure results in a log or diary and take to medical appointments Keep log near medications and blood pressure machine to remind yourself to write down vitals in log Continue to monitor self for signs and symptoms of high blood pressure (swelling in extremities, headache, etc) Increase activity as tolerated  Continue with outpatient therapy Follow Up Plan: The care management team will reach out to the patient again over the next 30 business days.       Plan:The care management team will reach out to the patient again over the next 30 business days.  Hubert Azure RN, MSN RN Care Management Coordinator [place] [phone] [email or web address]

## 2020-11-26 DIAGNOSIS — M533 Sacrococcygeal disorders, not elsewhere classified: Secondary | ICD-10-CM | POA: Diagnosis not present

## 2020-12-02 ENCOUNTER — Other Ambulatory Visit: Payer: Self-pay

## 2020-12-02 ENCOUNTER — Ambulatory Visit (INDEPENDENT_AMBULATORY_CARE_PROVIDER_SITE_OTHER): Payer: Medicare Other | Admitting: Family Medicine

## 2020-12-02 ENCOUNTER — Encounter: Payer: Self-pay | Admitting: Family Medicine

## 2020-12-02 DIAGNOSIS — T2101XA Burn of unspecified degree of chest wall, initial encounter: Secondary | ICD-10-CM | POA: Diagnosis not present

## 2020-12-02 DIAGNOSIS — M545 Low back pain, unspecified: Secondary | ICD-10-CM | POA: Diagnosis not present

## 2020-12-02 DIAGNOSIS — M5416 Radiculopathy, lumbar region: Secondary | ICD-10-CM | POA: Diagnosis not present

## 2020-12-02 DIAGNOSIS — M5136 Other intervertebral disc degeneration, lumbar region: Secondary | ICD-10-CM | POA: Diagnosis not present

## 2020-12-02 DIAGNOSIS — M25572 Pain in left ankle and joints of left foot: Secondary | ICD-10-CM | POA: Diagnosis not present

## 2020-12-02 DIAGNOSIS — Q762 Congenital spondylolisthesis: Secondary | ICD-10-CM | POA: Diagnosis not present

## 2020-12-02 DIAGNOSIS — M7062 Trochanteric bursitis, left hip: Secondary | ICD-10-CM | POA: Diagnosis not present

## 2020-12-02 DIAGNOSIS — M6281 Muscle weakness (generalized): Secondary | ICD-10-CM | POA: Diagnosis not present

## 2020-12-02 DIAGNOSIS — M542 Cervicalgia: Secondary | ICD-10-CM | POA: Diagnosis not present

## 2020-12-02 DIAGNOSIS — M5032 Other cervical disc degeneration, mid-cervical region, unspecified level: Secondary | ICD-10-CM | POA: Diagnosis not present

## 2020-12-02 DIAGNOSIS — E1165 Type 2 diabetes mellitus with hyperglycemia: Secondary | ICD-10-CM | POA: Diagnosis not present

## 2020-12-02 DIAGNOSIS — M25519 Pain in unspecified shoulder: Secondary | ICD-10-CM | POA: Diagnosis not present

## 2020-12-02 DIAGNOSIS — M25559 Pain in unspecified hip: Secondary | ICD-10-CM | POA: Diagnosis not present

## 2020-12-02 MED ORDER — GABAPENTIN 100 MG PO CAPS: 100.0000 mg | ORAL_CAPSULE | Freq: Three times a day (TID) | ORAL | 1 refills | Status: DC

## 2020-12-02 NOTE — Progress Notes (Signed)
Tommi Rumps, MD Phone: [phone]  Kelly [name] is a 80 y.o. female who presents today for same day visit.  Lumbar radiculopathy: This has been an ongoing issue since the end of June.  She has seen orthopedics for this.  Initially felt as though this was related to bursitis though she had an injection and it was not helpful.  They eventually realized it was coming from her back and she had an SI joint injection that helped relieve her discomfort for a few hours.  Now they are thinking about doing epidural injection.  She was given prednisone and that shot her sugar up and she ended up in the emergency department.  She does report seeing neurosurgery next week.  She has been quite uncomfortable the last several days and has been taking Tylenol two 650 mg tablets 3 times a day in addition to her Vicodin twice daily.  She feels like there are knives stabbing her in the back and it radiates down the front of her right leg.  She did have an MRI lumbar spine that revealed significant degenerative changes.  Social History   Tobacco Use  Smoking Status Never  Smokeless Tobacco Never    Current Outpatient Medications on File Prior to Visit  Medication Sig Dispense Refill   amLODipine (NORVASC) 5 MG tablet Take 1 tablet (5 mg total) by mouth daily. 90 tablet 3   aspirin EC 81 MG tablet Take 81 mg by mouth daily.     atorvastatin (LIPITOR) 80 MG tablet Take 1 tablet (80 mg total) by mouth daily. 90 tablet 3   BD PEN NEEDLE NANO U/F 32G X 4 MM MISC USE DAILY WITH VICTOZA AND BASAGLAR 180 each 2   bisacodyl (DULCOLAX) 5 MG EC tablet Take 5 mg by mouth daily as needed for moderate constipation.     calcitRIOL (ROCALTROL) 0.25 MCG capsule Take 0.25 mcg by mouth daily.     Calcium Citrate-Vitamin D (CALCIUM CITRATE +D PO) Take 600 mg by mouth daily.      Cholecalciferol (VITAMIN D) 50 MCG (2000 UT) CAPS Take 2,000 Units by mouth daily.      Continuous Blood Gluc Sensor (FREESTYLE LIBRE 2  SENSOR) MISC USE TO CHECK SUGAR AT LEAST4 TIMES A DAY 6 each 3   cyclobenzaprine (FLEXERIL) 10 MG tablet TAKE 1/2 TABLET (5MG  TOTAL)3 TIMES A DAY AS NEEDED    FOR MUSCLE SPASMS 45 tablet 0   denosumab (PROLIA) 60 MG/ML SOSY injection Inject 60 mg into the skin every 6 (six) months.     DULoxetine 40 MG CPEP Take 40 mg by mouth daily. 90 capsule 1   glucosamine-chondroitin 500-400 MG tablet Take 1 tablet by mouth 3 (three) times daily.     glucose blood (ACCU-CHEK AVIVA PLUS) test strip Use to check blood sugar up to 4 times daily 100 each 3   HYDROcodone-acetaminophen (NORCO/VICODIN) 5-325 MG tablet      Insulin Glargine (BASAGLAR KWIKPEN) 100 UNIT/ML Inject 8 Units into the skin daily. 15 mL 3   Lancets MISC Use up to 4 times daily to check blood sugars. 200 each 11   liraglutide (VICTOZA) 18 MG/3ML SOPN Inject 1.2 mg into the skin daily. 27 mL 3   losartan (COZAAR) 50 MG tablet TAKE 1 TABLET DAILY 90 tablet 1   Melatonin 5 MG CHEW Chew 5 mg by mouth daily.     triamcinolone ointment (KENALOG) 0.5 % Apply 1 application topically 2 (two) times daily. 30 g 0  potassium chloride (K-DUR) 10 MEQ tablet  (Patient not taking: No sig reported)     No current facility-administered medications on file prior to visit.     ROS see history of present illness  Objective  Physical Exam Vitals:   12/02/20 1408  BP: (!) 150/88  Pulse: 80  Temp: 97.7 F (36.5 C)  SpO2: 95%    BP Readings from Last 3 Encounters:  12/02/20 (!) 150/88  11/04/20 (!) 156/75  09/29/20 122/70   Wt Readings from Last 3 Encounters:  12/02/20 128 lb 12.8 oz (58.4 kg)  11/04/20 126 lb (57.2 kg)  11/03/20 126 lb (57.2 kg)    Physical Exam Musculoskeletal:     Comments: No midline spine tenderness, no midline spine step-off, there is lumbar muscular back tenderness  Neurological:     Comments: 5/5 strength bilateral quads, hamstrings, plantar flexion, and dorsiflexion, sensation to light touch intact bilateral  lower extremities     Assessment/Plan: Please see individual problem list.  Problem List Items Addressed This Visit     Lumbar radiculopathy    Symptoms are consistent with lumbar radiculopathy.  She has already seen orthopedics and has an appointment scheduled with neurosurgery next week.  I encouraged her to keep that appointment.  Discussed that interventions at this point with medication are fairly limited.  I am hesitant to prescribe any narcotics as she already receives chronic narcotics from another provider.  We will have her increase her gabapentin to 100 mg 3 times a day to see if that is beneficial.  She will monitor for drowsiness with this.  If she gets excessively drowsy she will let us know.  I did advise that she switch over to extra strength Tylenol and take 1000 mg every 8 hours as needed.  Discussed that this dosage would be better for use with her 2 tablets of Vicodin.  She will let us know when they decide to proceed with the epidural injection so that we can give directions on taking glipizide for 5 days after the injection.       Relevant Medications   gabapentin (NEURONTIN) 100 MG capsule     Return for As planned.  This visit occurred during the SARS-CoV-2 public health emergency.  Safety protocols were in place, including screening questions prior to the visit, additional usage of staff PPE, and extensive cleaning of exam room while observing appropriate contact time as indicated for disinfecting solutions.    Tommi Rumps, MD [place];

## 2020-12-02 NOTE — Patient Instructions (Signed)
Nice to see you. Please try taking the gabapentin 1 capsule 3 times a day. Please switch to Tylenol 500 mg tablets.  You can take two 500 mg tablets every 8 hours as needed for pain. Please see the neurosurgeon as planned.

## 2020-12-02 NOTE — Assessment & Plan Note (Addendum)
Symptoms are consistent with lumbar radiculopathy.  She has already seen orthopedics and has an appointment scheduled with neurosurgery next week.  I encouraged her to keep that appointment.  Discussed that interventions at this point with medication are fairly limited.  I am hesitant to prescribe any narcotics as she already receives chronic narcotics from another provider.  We will have her increase her gabapentin to 100 mg 3 times a day to see if that is beneficial.  She will monitor for drowsiness with this.  If she gets excessively drowsy she will let us know.  I did advise that she switch over to extra strength Tylenol and take 1000 mg every 8 hours as needed.  Discussed that this dosage would be better for use with her 2 tablets of Vicodin.  She will let us know when they decide to proceed with the epidural injection so that we can give directions on taking glipizide for 5 days after the injection.

## 2020-12-04 ENCOUNTER — Other Ambulatory Visit: Payer: Self-pay | Admitting: Family Medicine

## 2020-12-04 DIAGNOSIS — Z794 Long term (current) use of insulin: Secondary | ICD-10-CM

## 2020-12-04 DIAGNOSIS — E1122 Type 2 diabetes mellitus with diabetic chronic kidney disease: Secondary | ICD-10-CM

## 2020-12-04 DIAGNOSIS — M545 Low back pain, unspecified: Secondary | ICD-10-CM | POA: Diagnosis not present

## 2020-12-08 DIAGNOSIS — M5416 Radiculopathy, lumbar region: Secondary | ICD-10-CM | POA: Diagnosis not present

## 2020-12-08 DIAGNOSIS — M7061 Trochanteric bursitis, right hip: Secondary | ICD-10-CM | POA: Insufficient documentation

## 2020-12-08 DIAGNOSIS — M5126 Other intervertebral disc displacement, lumbar region: Secondary | ICD-10-CM | POA: Diagnosis not present

## 2020-12-08 DIAGNOSIS — I10 Essential (primary) hypertension: Secondary | ICD-10-CM | POA: Diagnosis not present

## 2020-12-08 DIAGNOSIS — M4807 Spinal stenosis, lumbosacral region: Secondary | ICD-10-CM | POA: Diagnosis not present

## 2020-12-08 DIAGNOSIS — M4317 Spondylolisthesis, lumbosacral region: Secondary | ICD-10-CM | POA: Diagnosis not present

## 2020-12-09 DIAGNOSIS — M461 Sacroiliitis, not elsewhere classified: Secondary | ICD-10-CM | POA: Diagnosis not present

## 2020-12-09 DIAGNOSIS — E119 Type 2 diabetes mellitus without complications: Secondary | ICD-10-CM | POA: Diagnosis not present

## 2020-12-11 ENCOUNTER — Emergency Department
Admission: EM | Admit: 2020-12-11 | Discharge: 2020-12-11 | Disposition: A | Discharge status: Home / Self Care | Payer: Medicare Other | Attending: Emergency Medicine | Admitting: Emergency Medicine

## 2020-12-11 ENCOUNTER — Encounter: Payer: Self-pay | Admitting: Emergency Medicine

## 2020-12-11 ENCOUNTER — Other Ambulatory Visit: Payer: Self-pay

## 2020-12-11 ENCOUNTER — Telehealth: Payer: Self-pay | Admitting: Family Medicine

## 2020-12-11 DIAGNOSIS — Z5321 Procedure and treatment not carried out due to patient leaving prior to being seen by health care provider: Secondary | ICD-10-CM | POA: Diagnosis not present

## 2020-12-11 DIAGNOSIS — R739 Hyperglycemia, unspecified: Secondary | ICD-10-CM | POA: Insufficient documentation

## 2020-12-11 LAB — CBC
HCT: 40.9 % (ref 36.0–46.0)
Hemoglobin: 13.8 g/dL (ref 12.0–15.0)
MCH: 32.5 pg (ref 26.0–34.0)
MCHC: 33.7 g/dL (ref 30.0–36.0)
MCV: 96.5 fL (ref 80.0–100.0)
Platelets: 167 10*3/uL (ref 150–400)
RBC: 4.24 MIL/uL (ref 3.87–5.11)
RDW: 13.5 % (ref 11.5–15.5)
WBC: 6.3 10*3/uL (ref 4.0–10.5)
nRBC: 0 % (ref 0.0–0.2)

## 2020-12-11 LAB — CBG MONITORING, ED
Glucose-Capillary: 446 mg/dL — ABNORMAL HIGH (ref 70–99)
Glucose-Capillary: 246 mg/dL — ABNORMAL HIGH (ref 70–99)
Glucose-Capillary: 233 mg/dL — ABNORMAL HIGH (ref 70–99)

## 2020-12-11 LAB — BASIC METABOLIC PANEL
Anion gap: 8 (ref 5–15)
BUN: 37 mg/dL — ABNORMAL HIGH (ref 8–23)
CO2: 26 mmol/L (ref 22–32)
Calcium: 9.8 mg/dL (ref 8.9–10.3)
Chloride: 103 mmol/L (ref 98–111)
Creatinine, Ser: 2.09 mg/dL — ABNORMAL HIGH (ref 0.44–1.00)
GFR, Estimated: 24 mL/min — ABNORMAL LOW (ref >60)
Glucose, Bld: 481 mg/dL — ABNORMAL HIGH (ref 70–99)
Potassium: 4.1 mmol/L (ref 3.5–5.1)
Sodium: 137 mmol/L (ref 135–145)

## 2020-12-11 NOTE — Telephone Encounter (Signed)
Noted.  I would advise that she take the glipizide 5 mg with breakfast for 5 days following the epidural.  If her glucose goes above 300 she should let us know.  If it drops less than 80 with this regimen she needs to eat something and let us know.  There is an on-call provider available if she has any issues over the weekend.  She just needs to call the office and she will get forwarded to the call service.

## 2020-12-11 NOTE — Telephone Encounter (Signed)
Patient would like for Dr.Sonnenberg to know she had an epidural shot.She also wants Dr.Sonnenberg to know that her glipizide dosage is 10 mg and 5 mg.Pt stated it is ok to call her at home and leave a voicemail if she does not answer.

## 2020-12-11 NOTE — Telephone Encounter (Signed)
Patient returned office phone call. 

## 2020-12-11 NOTE — Telephone Encounter (Signed)
I called and lVM for patient to call back.  Kelly [name],cma

## 2020-12-11 NOTE — Telephone Encounter (Signed)
Patient called back and informed me that her BS was 599 and she had took a 20 mg of glipizide 30 mins before she called, After speaking with the provider he advised that she go to the ER and I let her know and she understood.  I called ahead and informed the triage nurse at [facility] that the patient was arriving soon.  Mabel Unrein,cma

## 2020-12-11 NOTE — ED Triage Notes (Signed)
Pt states she had a steroid shot a few days ago and it has caused her blood sugar to go above 600.

## 2020-12-11 NOTE — ED Notes (Signed)
Pt reports is leaving and will follow up with her MD Monday. Pt encouraged to stay but states she feels better and will follow up

## 2020-12-14 ENCOUNTER — Telehealth: Payer: Self-pay | Admitting: Family Medicine

## 2020-12-14 NOTE — Telephone Encounter (Signed)
Noted.  I am glad her sugars trended down.  I did not see a note from a provider though it may be delayed and then getting 1 put in.  She should continue to monitor her sugar and if it trends back up she needs to let us know.

## 2020-12-14 NOTE — Telephone Encounter (Signed)
I called and followed up with the patient and she stated she went to the ER and they gave her IV fluids and she is better she has her BS under control, patient stated this morning it was 188.  She spoke like she did see someone at the ER.  Jamichael Knotts,cma

## 2020-12-14 NOTE — Telephone Encounter (Signed)
Please follow-up with the patient.  She was supposed to be seen in the emergency department though it looks like she left without seeing a provider.  She stated she was feeling better at the time that she left.  Please see what her sugars have been over the weekend.

## 2020-12-16 DIAGNOSIS — M545 Low back pain, unspecified: Secondary | ICD-10-CM | POA: Diagnosis not present

## 2020-12-21 ENCOUNTER — Ambulatory Visit (INDEPENDENT_AMBULATORY_CARE_PROVIDER_SITE_OTHER): Payer: Medicare Other | Admitting: *Deleted

## 2020-12-21 ENCOUNTER — Other Ambulatory Visit: Payer: Self-pay | Admitting: Family Medicine

## 2020-12-21 DIAGNOSIS — N184 Chronic kidney disease, stage 4 (severe): Secondary | ICD-10-CM

## 2020-12-21 DIAGNOSIS — I10 Essential (primary) hypertension: Secondary | ICD-10-CM | POA: Diagnosis not present

## 2020-12-21 DIAGNOSIS — E1122 Type 2 diabetes mellitus with diabetic chronic kidney disease: Secondary | ICD-10-CM | POA: Diagnosis not present

## 2020-12-21 DIAGNOSIS — Z1231 Encounter for screening mammogram for malignant neoplasm of breast: Secondary | ICD-10-CM

## 2020-12-21 DIAGNOSIS — Z794 Long term (current) use of insulin: Secondary | ICD-10-CM | POA: Diagnosis not present

## 2020-12-21 NOTE — Patient Instructions (Signed)
Visit Information  PATIENT GOALS:  Goals Addressed             This Visit's Progress    (CCM RNCM) Monitor and Manage My Blood Sugar   On track    Timeframe:  Long-Range Goal Priority:  Medium Start Date:  06/16/20                           Expected End Date:  05/07/21                     Follow Up Date 01/18/21   Check blood sugar at prescribed times (at least 4 times a day)  Check blood sugar if I feel it is too high or too low, calling provider for sustained highs or lows Enter blood sugar readings and medication/insulin into daily log taking to provider for review Continue to keep food diary notating what foods elevate blood sugars Try to increase fruits and vegetables in diet (eat them daily) Take medication as prescribed Current Hgb A1C is increased 7.8 with goal being below 7 Make sure you eat a bedtime snack nightly to help prevent morning hypoglycemia   Why is this important?   Checking your blood sugar at home helps to keep it from getting very high or very low.  Writing the results in a diary or log helps the doctor know how to care for you.  Your blood sugar log should have the time, date and the results.  Also, write down the amount of insulin or other medicine that you take.  Other information, like what you ate, exercise done and how you were feeling, will also be helpful.       (CCM RNCM) Track and Manage My Blood Pressure-Hypertension   On track    Timeframe:  Long-Range Goal Priority:  Medium Start Date: 06/16/20                            Expected End Date:  05/07/21                     Follow Up Date 01/18/21    Check blood pressure at least weekly Write blood pressure results in a log or diary and take to medical appointments Keep log near medications and blood pressure machine to remind yourself to write down vitals in log Continue to monitor self for signs and symptoms of high blood pressure (swelling in extremities, headache, etc) Increase activity as  tolerated  Continue with outpatient therapy Take tylenol or pain medicine prior to therapy  Why is this important?   You won't feel high blood pressure, but it can still hurt your blood vessels.  High blood pressure can cause heart or kidney problems. It can also cause a stroke.  Making lifestyle changes like losing a little weight or eating less salt will help.  Checking your blood pressure at home and at different times of the day can help to control blood pressure.  If the doctor prescribes medicine remember to take it the way the doctor ordered.  Call the office if you cannot afford the medicine or if there are questions about it.          Patient verbalizes understanding of instructions provided today and agrees to view in [place].   The care management team will reach out to the patient again over the next 30 business  days.   Hubert Azure RN, MSN RN Care Management Coordinator [place] [phone] [email or web address]

## 2020-12-21 NOTE — Chronic Care Management (AMB) (Signed)
Chronic Care Management   CCM RN Visit Note  12/21/2020 Name: Kelly [name] MRN: [id number] DOB: December 10, 1940  Subjective: Kelly [name] is a 80 y.o. year old female who is a primary care patient of Caryl Bis, Angela Adam, MD. The care management team was consulted for assistance with disease management and care coordination needs.    Engaged with patient by telephone for follow up visit in response to provider referral for case management and/or care coordination services.   Consent to Services:  The patient was given information about Chronic Care Management services, agreed to services, and gave verbal consent prior to initiation of services.  Please see initial visit note for detailed documentation.   Patient agreed to services and verbal consent obtained.   Assessment: Review of patient past medical history, allergies, medications, health status, including review of consultants reports, laboratory and other test data, was performed as part of comprehensive evaluation and provision of chronic care management services.   SDOH (Social Determinants of Health) assessments and interventions performed:    CCM Care Plan  Allergies  Allergen Reactions   Prilosec Otc [Omeprazole Magnesium] Other (See Comments)    Maybe cause of acute interstitial nephritis   Sucralfate Other (See Comments)    Maybe cause of acute interstitial nephritis   Amoxicillin Diarrhea   Morphine And Related Other (See Comments)    Chest pains Chest pains    Outpatient Encounter Medications as of 12/21/2020  Medication Sig Note   liraglutide (VICTOZA) 18 MG/3ML SOPN Inject 1.2 mg into the skin daily. 10/14/2020: 0.6 mg daily   amLODipine (NORVASC) 5 MG tablet Take 1 tablet (5 mg total) by mouth daily.    aspirin EC 81 MG tablet Take 81 mg by mouth daily.    atorvastatin (LIPITOR) 80 MG tablet Take 1 tablet (80 mg total) by mouth daily.    BD PEN NEEDLE NANO U/F 32G X 4 MM MISC USE DAILY WITH VICTOZA  AND BASAGLAR    bisacodyl (DULCOLAX) 5 MG EC tablet Take 5 mg by mouth daily as needed for moderate constipation.    calcitRIOL (ROCALTROL) 0.25 MCG capsule Take 0.25 mcg by mouth daily.    Calcium Citrate-Vitamin D (CALCIUM CITRATE +D PO) Take 600 mg by mouth daily.     Cholecalciferol (VITAMIN D) 50 MCG (2000 UT) CAPS Take 2,000 Units by mouth daily.     Continuous Blood Gluc Sensor (FREESTYLE LIBRE 2 SENSOR) MISC USE TO CHECK SUGAR AT LEAST4 TIMES A DAY    cyclobenzaprine (FLEXERIL) 10 MG tablet TAKE 1/2 TABLET (5MG  TOTAL)3 TIMES A DAY AS NEEDED    FOR MUSCLE SPASMS    denosumab (PROLIA) 60 MG/ML SOSY injection Inject 60 mg into the skin every 6 (six) months.    DULoxetine 40 MG CPEP Take 40 mg by mouth daily.    gabapentin (NEURONTIN) 100 MG capsule Take 1 capsule (100 mg total) by mouth 3 (three) times daily.    glucosamine-chondroitin 500-400 MG tablet Take 1 tablet by mouth 3 (three) times daily. 06/16/2020: Reports taking once a day   glucose blood (ACCU-CHEK AVIVA PLUS) test strip Use to check blood sugar up to 4 times daily    HYDROcodone-acetaminophen (NORCO/VICODIN) 5-325 MG tablet     Insulin Glargine (BASAGLAR KWIKPEN) 100 UNIT/ML INJECT 20 UNITS TOTAL      SUBCUTANEOUSLY DAILY. 12/21/2020: Reports taking 8 units daily   Lancets MISC Use up to 4 times daily to check blood sugars.    losartan (COZAAR) 50 MG  tablet TAKE 1 TABLET DAILY    Melatonin 5 MG CHEW Chew 5 mg by mouth daily.    potassium chloride (K-DUR) 10 MEQ tablet  (Patient not taking: No sig reported) 11/06/2017: Only takes when she takes furosemide   triamcinolone ointment (KENALOG) 0.5 % Apply 1 application topically 2 (two) times daily.    No facility-administered encounter medications on file as of 12/21/2020.    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   Headache disorder 09/10/2018   Cervico-occipital neuralgia 07/23/2018   Osteoporosis 07/23/2018   Cramp and spasm 02/12/2018   Paresthesia of hand 10/25/2017   Lesion of rectum 09/07/2017   Loss of height 08/15/2017   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   Cervical spondylosis without myelopathy 08/15/2017   DDD (degenerative disc disease), cervical 08/15/2017   Bursitis of hip 08/15/2017   Female stress incontinence 08/15/2017   Muscle weakness 08/15/2017   Neck pain 08/15/2017   Spondylolisthesis, congenital 08/15/2017   Radial styloid tenosynovitis 08/15/2017   Senile osteoporosis 08/15/2017   Hip pain 08/15/2017   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   Rectal hemorrhage 07/25/2017   Essential tremor 04/26/2017   Other fatigue 03/22/2017   Left ventricular hypertrophy 10/12/2016   Chronic kidney disease, stage 4 (severe) ([place]) 08/26/2016   OSA on CPAP 08/26/2016    Conditions to be addressed/monitored:HTN and DMII  Care Plan : Diabetes Type 2 (Adult)  Updates made by Leona Singleton, RN since 12/21/2020 12:00 AM     Problem: Glycemic Management (Diabetes, Type 2)   Priority: Medium     Long-Range Goal: Patient will report decrease in Hgb A1C by 0.2 points within the next 90 days   Start Date: 06/16/2020  Expected End Date: 05/07/2021  This Visit's Progress: Not on track  Recent Progress: On track  Priority: Medium  Note:   Objective:  Lab Results  Component Value Date   HGBA1C 7.8 (H) 08/11/2020  Current Barriers:  Knowledge  Deficits related to basic Diabetes pathophysiology and self care/management as evidenced by increasing Hgb A1C.  Reports continued use of her Freestyle [place] continuous monitoring system.  Fasting blood sugar this morning was 159 with recent fasting ranges in 130-180's.  States blood sugars increased after spinal injection to 500's; went to emergency room and received liter of fluid; took 5 days worth of glipizide; now they are back down. Case Manager Clinical Goal(s):  Patient will demonstrate improved adherence to prescribed treatment plan for diabetes self care/management as evidenced by: Four times a day monitoring and recording of CBG, adherence to ADA/ carb modified diet, exercise at least 3 days/week, adherence to prescribed medication regimen Interventions:  Collaboration with [name], MD regarding development and update of comprehensive plan of care as evidenced by provider attestation and co-signature Inter-disciplinary care team collaboration (see longitudinal plan of care) Reviewed medications with patient and medication changes and discussed importance of medication  adherence Discussed plans with patient for ongoing care management follow up and provided patient with direct contact information for care management team Discussed signs and symptoms of hypo and hyperglycemia and importance of correct treatment (not to 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   Discussed food diary and carbohydrate modified diet, encouraged to continue to keep food diary taking to follow up appointments, encouraged healthier  food options and increasing vegetable intake Discussed carbohydrate counting and diabetic diet, sent EMMI Diabetes Diet Video; encouraged to view Sent Diabetes Diet and Carbohydrate Counting handouts; encouraged to review Encouraged to continue to work with CCM Pharmacist and to contact CCM Pharmacist for sustained hyperglycemic episodes for possible medication adjustments Discussed appropriate meal portions and how to use food diary with making good food choices Discussed increasing Hgb A1C and ways to help reduce Discussed and encouraged eating bedtime snack nightly to help prevent early morning hypoglycemia Reviewed recent blood sugar values and discussed ways to help reduce ranges Discussed how steroids can affect blood sugars and encouraged patient to monitor after steroid injection Patient Goals/Self-Care Activities Over the next 30 days, patient will:  Check blood sugar at prescribed times (at least 4 times a day)  Check blood sugar if I feel it is too high or too low, calling provider for sustained highs or lows Enter blood sugar readings and medication/insulin into daily log taking to provider for review Continue to keep food diary notating what foods elevate blood sugars Try to increase fruits and vegetables in diet (eat them daily) Take medication as prescribed Current Hgb A1C is increased 7.8 with goal being below 7 Make sure you eat a bedtime snack nightly to help prevent morning hypoglycemia Follow Up Plan: The care management team will reach out to the patient again over the next 30 business days.      Care Plan : Hypertension (Adult)  Updates made by Leona Singleton, RN since 12/21/2020 12:00 AM     Problem: Hypertension (Hypertension)   Priority: Medium     Long-Range Goal: Hypertension Monitored   Start Date: 06/16/2020  Expected End Date: 12/13/2020  This Visit's Progress: On track  Recent Progress: On track  Priority: Medium  Note:   Current Barriers:   Knowledge Deficits related to basic understanding of hypertension pathophysiology and self care management; has been monitoring blood pressures at home she says about once a week at least.  Reports pressures have been within range, 130/80's. States she did receive spinal injection and her back pain is a lot better.  "Able to walk without walker now".  Continues to report butt/hip pain, but feels it is getting better with therapy.  Encouraged her to speak with orthopedist about continued pain.  Denies any falls but admits mobility is limited due to pain. Nurse Case Manager Clinical Goal(s):  Over the next 90 days, patient will verbalize understanding of plan for hypertension management Over the next 90 days, patient will attend all scheduled medical appointments Over the next 90 days, patient will demonstrate improved adherence to prescribed treatment plan for hypertension as evidenced by taking all medications as prescribed, monitoring and recording blood pressure as directed, adhering to low  sodium/DASH diet Interventions:  Collaboration with [name], MD regarding development and update of comprehensive plan of care as evidenced by provider attestation and co-signature Inter-disciplinary care team collaboration (see longitudinal plan of care) Evaluation of current treatment plan related to hypertension self management and patient's adherence to plan as established by provider. Reviewed medications with patient and discussed importance of compliance Discussed plans with patient for ongoing care management follow up and provided patient with direct contact information for care management team Advised patient, providing education and rationale, to monitor blood pressure daily and record, calling PCP for findings outside established parameters.  Reviewed scheduled/upcoming provider appointments including: encouraged to contact PCP if rash does not clear in the next 2 weeks Discussed sleep and  sleep pattern and encouraged patient to stay awake most of the day, avoid cell phone/screen time at bedtime, trying to decrease stimuli at bedtime Instructed and encouraged patient to place Calendar near medications or blood pressure cuff for ease of logging pressures when she takes them Encouraged to increase activity as tolerated, fall precautions and preventions reviewed and discussed; continue with outpatient therapy Reviewed signs and symptoms of hypertension and encouraged patient to continue to self monitor Again discussed and encouraged to use 2022 [place] to log vital signs for provider review Emotional support and empathy provided to patient; congratulated on reduction of pain post spinal injection Patient Goals/Self-Care Activities:  Over the next 30 days, patient will: Check blood pressure at least weekly Write blood pressure results in a log or diary and take to medical appointments Keep log near medications and blood pressure machine to remind yourself to write down vitals in log Continue to monitor self for signs and symptoms of high blood pressure (swelling in extremities, headache, etc) Increase activity as tolerated  Continue with outpatient therapy Take tylenol or pain medicine prior to therapy Follow Up Plan: The care management team will reach out to the patient again over the next 30 business days.       Plan:The care management team will reach out to the patient again over the next 30 business days.  Hubert Azure RN, MSN RN Care Management Coordinator [place] [phone] [email or web address]

## 2020-12-23 DIAGNOSIS — M545 Low back pain, unspecified: Secondary | ICD-10-CM | POA: Diagnosis not present

## 2020-12-30 ENCOUNTER — Other Ambulatory Visit: Payer: Self-pay

## 2020-12-30 ENCOUNTER — Ambulatory Visit
Admission: RE | Admit: 2020-12-30 | Discharge: 2020-12-30 | Disposition: A | Discharge status: Home / Self Care | Payer: Medicare Other | Source: Ambulatory Visit | Attending: Family Medicine | Admitting: Family Medicine

## 2020-12-30 DIAGNOSIS — Z1231 Encounter for screening mammogram for malignant neoplasm of breast: Secondary | ICD-10-CM | POA: Diagnosis not present

## 2020-12-30 DIAGNOSIS — M545 Low back pain, unspecified: Secondary | ICD-10-CM | POA: Diagnosis not present

## 2021-01-01 ENCOUNTER — Other Ambulatory Visit: Payer: Self-pay

## 2021-01-01 ENCOUNTER — Ambulatory Visit (INDEPENDENT_AMBULATORY_CARE_PROVIDER_SITE_OTHER): Payer: Medicare Other | Admitting: Family Medicine

## 2021-01-01 VITALS — BP 120/80 | HR 75 | Temp 98.6°F | Ht 64.0 in | Wt 120.6 lb

## 2021-01-01 DIAGNOSIS — E1122 Type 2 diabetes mellitus with diabetic chronic kidney disease: Secondary | ICD-10-CM

## 2021-01-01 DIAGNOSIS — G8929 Other chronic pain: Secondary | ICD-10-CM

## 2021-01-01 DIAGNOSIS — N184 Chronic kidney disease, stage 4 (severe): Secondary | ICD-10-CM

## 2021-01-01 DIAGNOSIS — M5441 Lumbago with sciatica, right side: Secondary | ICD-10-CM

## 2021-01-01 DIAGNOSIS — Z794 Long term (current) use of insulin: Secondary | ICD-10-CM | POA: Diagnosis not present

## 2021-01-01 LAB — POCT GLYCOSYLATED HEMOGLOBIN (HGB A1C): Hemoglobin A1C: 8.5 % — AB (ref 4.0–5.6)

## 2021-01-01 NOTE — Patient Instructions (Signed)
Nice to see you. We will get an A1c today and contact you with the results.  We will increase the victoza to 1.2 mg daily.

## 2021-01-05 NOTE — Assessment & Plan Note (Signed)
This continues to be an issue.  I discussed at this point she needs to follow-up with the neurosurgeon to get their input.  She will continue to monitor.

## 2021-01-05 NOTE — Progress Notes (Signed)
Kelly Rumps, MD Phone: [phone]  Kelly [name] is a 80 y.o. female who presents today for follow-up.  Low back pain/sciatica: Patient notes she had a back injection and it helped for 1 day.  Her daughter reports she seems to be doing better.  She saw neurosurgery and notes they advised her she might need surgery though they wanted to see how she did with the injection.  She has been doing physical therapy as well.  Diabetes: Her sugars shot up after getting her steroid injection in her back.  It did trend down over time and she did not stay to be evaluated in the emergency department.  Her 14-day average is 191.  She has been taking Basaglar 8 units once daily though typically takes it after 2 PM.  She is on Victoza 0.6 mg daily.  A lot of her elevated sugars are related to dietary indiscretions.  Social History   Tobacco Use  Smoking Status Never  Smokeless Tobacco Never    Current Outpatient Medications on File Prior to Visit  Medication Sig Dispense Refill   amLODipine (NORVASC) 5 MG tablet Take 1 tablet (5 mg total) by mouth daily. 90 tablet 3   aspirin EC 81 MG tablet Take 81 mg by mouth daily.     atorvastatin (LIPITOR) 80 MG tablet Take 1 tablet (80 mg total) by mouth daily. 90 tablet 3   BD PEN NEEDLE NANO U/F 32G X 4 MM MISC USE DAILY WITH VICTOZA AND BASAGLAR 180 each 2   bisacodyl (DULCOLAX) 5 MG EC tablet Take 5 mg by mouth daily as needed for moderate constipation.     calcitRIOL (ROCALTROL) 0.25 MCG capsule Take 0.25 mcg by mouth daily.     Calcium Citrate-Vitamin D (CALCIUM CITRATE +D PO) Take 600 mg by mouth daily.      Cholecalciferol (VITAMIN D) 50 MCG (2000 UT) CAPS Take 2,000 Units by mouth daily.      Continuous Blood Gluc Sensor (FREESTYLE LIBRE 2 SENSOR) MISC USE TO CHECK SUGAR AT LEAST4 TIMES A DAY 6 each 3   cyclobenzaprine (FLEXERIL) 10 MG tablet TAKE 1/2 TABLET (5MG  TOTAL)3 TIMES A DAY AS NEEDED    FOR MUSCLE SPASMS 45 tablet 0   denosumab  (PROLIA) 60 MG/ML SOSY injection Inject 60 mg into the skin every 6 (six) months.     DULoxetine 40 MG CPEP Take 40 mg by mouth daily. 90 capsule 1   gabapentin (NEURONTIN) 100 MG capsule Take 1 capsule (100 mg total) by mouth 3 (three) times daily. 180 capsule 1   glucosamine-chondroitin 500-400 MG tablet Take 1 tablet by mouth 3 (three) times daily.     glucose blood (ACCU-CHEK AVIVA PLUS) test strip Use to check blood sugar up to 4 times daily 100 each 3   HYDROcodone-acetaminophen (NORCO/VICODIN) 5-325 MG tablet      Insulin Glargine (BASAGLAR KWIKPEN) 100 UNIT/ML INJECT 20 UNITS TOTAL      SUBCUTANEOUSLY DAILY. 15 mL 0   Lancets MISC Use up to 4 times daily to check blood sugars. 200 each 11   liraglutide (VICTOZA) 18 MG/3ML SOPN Inject 1.2 mg into the skin daily. 27 mL 3   losartan (COZAAR) 50 MG tablet TAKE 1 TABLET DAILY 90 tablet 1   Melatonin 5 MG CHEW Chew 5 mg by mouth daily.     potassium chloride (K-DUR) 10 MEQ tablet      triamcinolone ointment (KENALOG) 0.5 % Apply 1 application topically 2 (two) times daily. [place]  g 0   No current facility-administered medications on file prior to visit.     ROS see history of present illness  Objective  Physical Exam Vitals:   01/01/21 1433  BP: 120/80  Pulse: 75  Temp: 98.6 F (37 C)  SpO2: 96%    BP Readings from Last 3 Encounters:  01/01/21 120/80  12/11/20 (!) 181/75  12/02/20 (!) 150/88   Wt Readings from Last 3 Encounters:  01/01/21 120 lb 9.6 oz (54.7 kg)  12/11/20 123 lb (55.8 kg)  12/02/20 128 lb 12.8 oz (58.4 kg)    Physical Exam   Assessment/Plan: Please see individual problem list.  Problem List Items Addressed This Visit     Low back pain    This continues to be an issue.  I discussed at this point she needs to follow-up with the neurosurgeon to get their input.  She will continue to monitor.      Type 2 diabetes mellitus ([name]) - Primary    Uncontrolled.  I encouraged her to take her Basaglar a day.   We will increase her Victoza to 1.2 milligrams daily.  She will continue Basaglar 8 units daily.  I also counseled her on limiting her carbohydrates.      Relevant Orders   POCT HgB A1C (Completed)     Return in about 3 months (around 04/03/2021).  This visit occurred during the SARS-CoV-2 public health emergency.  Safety protocols were in place, including screening questions prior to the visit, additional usage of staff PPE, and extensive cleaning of exam room while observing appropriate contact time as indicated for disinfecting solutions.    Kelly Rumps, MD [place]

## 2021-01-05 NOTE — Assessment & Plan Note (Signed)
Uncontrolled.  I encouraged her to take her Basaglar a day.  We will increase her Victoza to 1.2 milligrams daily.  She will continue Basaglar 8 units daily.  I also counseled her on limiting her carbohydrates.

## 2021-01-18 ENCOUNTER — Ambulatory Visit (INDEPENDENT_AMBULATORY_CARE_PROVIDER_SITE_OTHER): Payer: Medicare Other | Admitting: *Deleted

## 2021-01-18 DIAGNOSIS — E1122 Type 2 diabetes mellitus with diabetic chronic kidney disease: Secondary | ICD-10-CM | POA: Diagnosis not present

## 2021-01-18 DIAGNOSIS — Z794 Long term (current) use of insulin: Secondary | ICD-10-CM

## 2021-01-18 DIAGNOSIS — R8290 Unspecified abnormal findings in urine: Secondary | ICD-10-CM | POA: Diagnosis not present

## 2021-01-18 DIAGNOSIS — I10 Essential (primary) hypertension: Secondary | ICD-10-CM

## 2021-01-18 DIAGNOSIS — N184 Chronic kidney disease, stage 4 (severe): Secondary | ICD-10-CM | POA: Diagnosis not present

## 2021-01-18 NOTE — Chronic Care Management (AMB) (Signed)
Chronic Care Management   CCM RN Visit Note  01/18/2021 Name: Kelly [name] MRN: [id number] DOB: 1940/07/14  Subjective: Kelly [name] is a 80 y.o. year old female who is a primary care patient of Caryl Bis, Angela Adam, MD. The care management team was consulted for assistance with disease management and care coordination needs.    Engaged with patient by telephone for follow up visit in response to provider referral for case management and/or care coordination services.   Consent to Services:  The patient was given information about Chronic Care Management services, agreed to services, and gave verbal consent prior to initiation of services.  Please see initial visit note for detailed documentation.   Patient agreed to services and verbal consent obtained.   Assessment: Review of patient past medical history, allergies, medications, health status, including review of consultants reports, laboratory and other test data, was performed as part of comprehensive evaluation and provision of chronic care management services.   SDOH (Social Determinants of Health) assessments and interventions performed:    CCM Care Plan  Allergies  Allergen Reactions   Prilosec Otc [Omeprazole Magnesium] Other (See Comments)    Maybe cause of acute interstitial nephritis   Sucralfate Other (See Comments)    Maybe cause of acute interstitial nephritis   Amoxicillin Diarrhea   Morphine And Related Other (See Comments)    Chest pains Chest pains    Outpatient Encounter Medications as of 01/18/2021  Medication Sig Note   Insulin Glargine (BASAGLAR KWIKPEN) 100 UNIT/ML INJECT 20 UNITS TOTAL      SUBCUTANEOUSLY DAILY. 12/21/2020: Reports taking 8 units daily   liraglutide (VICTOZA) 18 MG/3ML SOPN Inject 1.2 mg into the skin daily.    amLODipine (NORVASC) 5 MG tablet Take 1 tablet (5 mg total) by mouth daily.    aspirin EC 81 MG tablet Take 81 mg by mouth daily.    atorvastatin (LIPITOR) 80 MG  tablet Take 1 tablet (80 mg total) by mouth daily.    BD PEN NEEDLE NANO U/F 32G X 4 MM MISC USE DAILY WITH VICTOZA AND BASAGLAR    bisacodyl (DULCOLAX) 5 MG EC tablet Take 5 mg by mouth daily as needed for moderate constipation.    calcitRIOL (ROCALTROL) 0.25 MCG capsule Take 0.25 mcg by mouth daily.    Calcium Citrate-Vitamin D (CALCIUM CITRATE +D PO) Take 600 mg by mouth daily.     Cholecalciferol (VITAMIN D) 50 MCG (2000 UT) CAPS Take 2,000 Units by mouth daily.     Continuous Blood Gluc Sensor (FREESTYLE LIBRE 2 SENSOR) MISC USE TO CHECK SUGAR AT LEAST4 TIMES A DAY    cyclobenzaprine (FLEXERIL) 10 MG tablet TAKE 1/2 TABLET (5MG  TOTAL)3 TIMES A DAY AS NEEDED    FOR MUSCLE SPASMS    denosumab (PROLIA) 60 MG/ML SOSY injection Inject 60 mg into the skin every 6 (six) months.    DULoxetine 40 MG CPEP Take 40 mg by mouth daily.    gabapentin (NEURONTIN) 100 MG capsule Take 1 capsule (100 mg total) by mouth 3 (three) times daily.    glucosamine-chondroitin 500-400 MG tablet Take 1 tablet by mouth 3 (three) times daily. 06/16/2020: Reports taking once a day   glucose blood (ACCU-CHEK AVIVA PLUS) test strip Use to check blood sugar up to 4 times daily    HYDROcodone-acetaminophen (NORCO/VICODIN) 5-325 MG tablet     Lancets MISC Use up to 4 times daily to check blood sugars.    losartan (COZAAR) 50 MG tablet TAKE 1  TABLET DAILY    Melatonin 5 MG CHEW Chew 5 mg by mouth daily.    potassium chloride (K-DUR) 10 MEQ tablet  11/06/2017: Only takes when she takes furosemide   triamcinolone ointment (KENALOG) 0.5 % Apply 1 application topically 2 (two) times daily.    No facility-administered encounter medications on file as of 01/18/2021.    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   Headache disorder 09/10/2018   Cervico-occipital neuralgia 07/23/2018   Osteoporosis 07/23/2018   Cramp and spasm 02/12/2018   Paresthesia of hand 10/25/2017   Lesion of rectum 09/07/2017   Loss of height 08/15/2017   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   Cervical spondylosis without myelopathy 08/15/2017   DDD (degenerative disc disease), cervical 08/15/2017   Bursitis of hip 08/15/2017   Female stress incontinence 08/15/2017   Muscle weakness 08/15/2017   Neck pain 08/15/2017   Spondylolisthesis, congenital 08/15/2017   Radial styloid tenosynovitis 08/15/2017   Senile osteoporosis 08/15/2017   Hip pain 08/15/2017   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   Rectal hemorrhage 07/25/2017   Essential tremor 04/26/2017   Other fatigue 03/22/2017   Left ventricular hypertrophy 10/12/2016   Chronic kidney disease, stage 4 (severe) ([place]) 08/26/2016   OSA on CPAP 08/26/2016    Conditions to be addressed/monitored:HTN and DMII  Care Plan : Diabetes Type 2 (Adult)  Updates made by Leona Singleton, RN since 01/18/2021 12:00 AM     Problem: Glycemic Management (Diabetes, Type 2)   Priority: Medium     Long-Range Goal: Patient will report decrease in Hgb A1C by 0.2 points within the next 90 days   Start Date: 06/16/2020  Expected End Date: 05/07/2021  This Visit's Progress: Not on track  Recent Progress: Not on track  Priority: Medium  Note:   Objective:  Lab Results  Component Value Date   HGBA1C 8.5 (A) 01/01/2021  Current Barriers:  Knowledge Deficits related to basic Diabetes pathophysiology and self  care/management as evidenced by increasing Hgb A1C.  Reports continued use of her Freestyle [employer] continuous monitoring system.  Fasting blood sugar ranges had been elevated post steroid injection and are now <150's.   A1C increased to 8.5, discussed ways to help reduce Case Manager Clinical Goal(s):  Patient will demonstrate improved adherence to prescribed treatment plan for diabetes self care/management as evidenced by: Four times a day monitoring and recording of CBG, adherence to ADA/ carb modified diet, exercise at least 3 days/week, adherence to prescribed medication regimen Interventions:  Collaboration with [name], MD regarding development and update of comprehensive plan of care as evidenced by provider attestation and co-signature Inter-disciplinary care team collaboration (see longitudinal plan of care) Reviewed medications with patient and medication changes and discussed importance of medication adherence Discussed plans with patient for ongoing care management follow up and provided patient with direct contact information for care management team Discussed signs and symptoms  of hypo and hyperglycemia and importance of correct treatment (not to 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   Discussed food diary and carbohydrate modified diet, encouraged to continue to keep food diary taking to follow up appointments, encouraged healthier food options and increasing vegetable intake Discussed carbohydrate counting and diabetic diet, sent EMMI Diabetes Diet Video; encouraged to view Sent Diabetes Diet and  Carbohydrate Counting handouts; encouraged to review Encouraged to continue to work with CCM Pharmacist and to contact CCM Pharmacist for sustained hyperglycemic episodes for possible medication adjustments Discussed appropriate meal portions and how to use food diary with making good food choices Discussed increasing Hgb A1C and ways to help reduce Discussed and encouraged eating bedtime snack nightly to help prevent early morning hypoglycemia Reviewed recent blood sugar values and discussed ways to help reduce ranges Discussed how steroids can affect blood sugars and encouraged patient to monitor after steroid injection Patient Goals/Self-Care Activities Over the next 30 days, patient will:  Check blood sugar at prescribed times (at least 4 times a day)  Check blood sugar if I feel it is too high or too low, calling provider for sustained highs or lows Enter blood sugar readings and medication/insulin into daily log taking to provider for review Continue to keep food diary notating what foods elevate blood sugars Try to increase fruits and vegetables in diet (eat them daily) Take medication as prescribed Follow Up Plan: The care management team will reach out to the patient again over the next 30 business days.      Care Plan : Hypertension (Adult)  Updates made by Leona Singleton, RN since 01/18/2021 12:00 AM     Problem: Hypertension (Hypertension)   Priority: Medium     Long-Range Goal: Hypertension Monitored   Start Date: 06/16/2020  Expected End Date: 07/06/2021  This Visit's Progress: On track  Recent Progress: On track  Priority: Medium  Note:   Current Barriers:  Knowledge Deficits related to basic understanding of hypertension pathophysiology and self care management; has been monitoring blood pressures at home she says about once a week at least.  Reports pressures have been 130/76-85. States she did receive spinal injection and her back pain a little better.  "Able to  walk without walker now".  Continues to report butt/hip pain, awaiting to see orthopedist.  Has completed outpatient therapy.  Denies any falls but admits mobility is limited due to pain. Nurse Case Manager Clinical Goal(s):  Over the next 90 days, patient will verbalize understanding of plan for hypertension management Over the next 90 days, patient will attend all scheduled medical appointments Over the next 90 days, patient will demonstrate improved adherence to prescribed treatment plan for hypertension as evidenced by taking all medications as prescribed, monitoring and recording blood pressure as directed, adhering to low sodium/DASH diet Interventions:  Collaboration with [name], MD regarding development and update of comprehensive plan of care as evidenced by provider attestation and co-signature Inter-disciplinary care team collaboration (see longitudinal plan of care) Evaluation of current treatment plan related to hypertension self management and patient's adherence to plan as established by provider. Reviewed medications with patient and discussed importance of  compliance Discussed plans with patient for ongoing care management follow up and provided patient with direct contact information for care management team Advised patient, providing education and rationale, to monitor blood pressure daily and record, calling PCP for findings outside established parameters.  Reviewed scheduled/upcoming provider appointments including: encouraged to contact PCP if rash does not clear in the next 2 weeks Discussed sleep and sleep pattern and encouraged patient to stay awake most of the day, avoid cell phone/screen time at bedtime, trying to decrease stimuli at bedtime Instructed and encouraged patient to place Calendar near medications or blood pressure cuff for ease of logging pressures when she takes them Encouraged to increase activity as tolerated, fall precautions and preventions  reviewed and discussed Reviewed signs and symptoms of hypertension and encouraged patient to continue to self monitor Again discussed and encouraged to use 2022 [place] to log vital signs for provider review Emotional support and empathy provided to patient; congratulated on completing outpatient therapy Patient Goals/Self-Care Activities:  Over the next 30 days, patient will: Check blood pressure at least weekly Write blood pressure results in a log or diary and take to medical appointments Keep log near medications and blood pressure machine to remind yourself to write down vitals in log Continue to monitor self for signs and symptoms of high blood pressure (swelling in extremities, headache, etc) Increase activity as tolerated  Fall precautions and preventions, consider using walker I help with pain Follow Up Plan: The care management team will reach out to the patient again over the next 30 business days.       Plan:The care management team will reach out to the patient again over the next 30 business days.  Hubert Azure RN, MSN RN Care Management Coordinator [place] [phone] [email or web address]

## 2021-01-18 NOTE — Patient Instructions (Signed)
Visit Information  PATIENT GOALS:  Goals Addressed             This Visit's Progress    (CCM RNCM) Monitor and Manage My Blood Sugar   On track    Timeframe:  Long-Range Goal Priority:  Medium Start Date:  06/16/20                           Expected End Date:  05/07/21                     Follow Up Date 02/16/21   Check blood sugar at prescribed times (at least 4 times a day)  Check blood sugar if I feel it is too high or too low, calling provider for sustained highs or lows Enter blood sugar readings and medication/insulin into daily log taking to provider for review Continue to keep food diary notating what foods elevate blood sugars Try to increase fruits and vegetables in diet (eat them daily) Take medication as prescribed Make sure you eat a bedtime snack nightly to help prevent morning hypoglycemia   Why is this important?   Checking your blood sugar at home helps to keep it from getting very high or very low.  Writing the results in a diary or log helps the doctor know how to care for you.  Your blood sugar log should have the time, date and the results.  Also, write down the amount of insulin or other medicine that you take.  Other information, like what you ate, exercise done and how you were feeling, will also be helpful.       (CCM RNCM) Track and Manage My Blood Pressure-Hypertension   On track    Timeframe:  Long-Range Goal Priority:  Medium Start Date: 06/16/20                            Expected End Date:  05/07/21                     Follow Up Date 02/16/21    Check blood pressure at least weekly Write blood pressure results in a log or diary and take to medical appointments Keep log near medications and blood pressure machine to remind yourself to write down vitals in log Continue to monitor self for signs and symptoms of high blood pressure (swelling in extremities, headache, etc) Increase activity as tolerated  Fall precautions and preventions, consider  using walker I help with pain  Why is this important?   You won't feel high blood pressure, but it can still hurt your blood vessels.  High blood pressure can cause heart or kidney problems. It can also cause a stroke.  Making lifestyle changes like losing a little weight or eating less salt will help.  Checking your blood pressure at home and at different times of the day can help to control blood pressure.  If the doctor prescribes medicine remember to take it the way the doctor ordered.  Call the office if you cannot afford the medicine or if there are questions about it.          Patient verbalizes understanding of instructions provided today and agrees to view in [place].   The care management team will reach out to the patient again over the next 30 business days.   Hubert Azure RN, MSN RN Care Management Coordinator [place]  [place] [email or web address]

## 2021-01-20 ENCOUNTER — Telehealth: Payer: Medicare Other

## 2021-01-20 DIAGNOSIS — E1122 Type 2 diabetes mellitus with diabetic chronic kidney disease: Secondary | ICD-10-CM | POA: Diagnosis not present

## 2021-01-20 DIAGNOSIS — I10 Essential (primary) hypertension: Secondary | ICD-10-CM | POA: Diagnosis not present

## 2021-01-20 DIAGNOSIS — N1832 Chronic kidney disease, stage 3b: Secondary | ICD-10-CM | POA: Diagnosis not present

## 2021-01-27 ENCOUNTER — Ambulatory Visit: Payer: Medicare Other | Admitting: Pharmacist

## 2021-01-27 ENCOUNTER — Other Ambulatory Visit: Payer: Self-pay

## 2021-01-27 DIAGNOSIS — Z794 Long term (current) use of insulin: Secondary | ICD-10-CM

## 2021-01-27 DIAGNOSIS — E1122 Type 2 diabetes mellitus with diabetic chronic kidney disease: Secondary | ICD-10-CM

## 2021-01-27 DIAGNOSIS — N184 Chronic kidney disease, stage 4 (severe): Secondary | ICD-10-CM

## 2021-01-27 DIAGNOSIS — I10 Essential (primary) hypertension: Secondary | ICD-10-CM

## 2021-01-27 DIAGNOSIS — E785 Hyperlipidemia, unspecified: Secondary | ICD-10-CM

## 2021-01-27 MED ORDER — DAPAGLIFLOZIN PROPANEDIOL 5 MG PO TABS: 5.0000 mg | ORAL_TABLET | Freq: Every day | ORAL | 0 refills | Status: DC

## 2021-01-27 NOTE — Chronic Care Management (AMB) (Signed)
Chronic Care Management Pharmacy Note  01/27/2021 Name:  Kelly [name] MRN:  [id number] DOB:  1940/12/05   Subjective: Kelly [name] is an 80 y.o. year old female who is a primary patient of Sonnenberg, Angela Adam, MD.  The CCM team was consulted for assistance with disease management and care coordination needs.    Engaged with patient face to face for follow up visit in response to provider referral for pharmacy case management and/or care coordination services.   Consent to Services:  The patient was given information about Chronic Care Management services, agreed to services, and gave verbal consent prior to initiation of services.  Please see initial visit note for detailed documentation.   Patient Care Team: [name], MD as PCP - General (Family Medicine) [name], RPH-CPP as Pharmacist (Pharmacist) Murlean Iba, MD (Nephrology) Felipa Furnace, DPM as Consulting Physician (Podiatry) Leona Singleton, RN as Case Manager    Objective:  Lab Results  Component Value Date   CREATININE 2.09 (H) 12/11/2020   CREATININE 1.85 (H) 11/04/2020   CREATININE 1.85 (H) 08/11/2020    Lab Results  Component Value Date   HGBA1C 8.5 (A) 01/01/2021   Last diabetic Eye exam:  Lab Results  Component Value Date/Time   HMDIABEYEEXA No Retinopathy 12/31/2019 04:30 PM    Last diabetic Foot exam: No results found for: HMDIABFOOTEX      Component Value Date/Time   CHOL 107 04/22/2019 1055   TRIG 168.0 (H) 04/22/2019 1055   HDL 27.40 (L) 04/22/2019 1055   CHOLHDL 4 04/22/2019 1055   VLDL 33.6 04/22/2019 1055   LDLCALC 46 04/22/2019 1055   LDLDIRECT 78.0 02/18/2019 0857    Hepatic Function Latest Ref Rng & Units 04/22/2019 04/11/2019 02/18/2019  Total Protein 6.0 - 8.3 g/dL 6.0 7.1 6.1  Albumin 3.5 - 5.2 g/dL 3.8 3.9 3.8  AST 0 - 37 U/L _0 ALT 0 - 35 U/L 34 21 26  Alk Phosphatase 39 - 117 U/L 79 73 75  Total Bilirubin 0.2 - 1.2 mg/dL  0.6 0.9 0.6  Bilirubin, Direct 0.0 - 0.3 mg/dL - - 0.1    Lab Results  Component Value Date/Time   TSH 3.93 03/22/2017 10:43 AM   TSH 0.719 07/09/2016 03:12 AM    CBC Latest Ref Rng & Units 12/11/2020 11/04/2020 10/20/2019  WBC 4.0 - 10.5 K/uL 6.3 10.1 9.7  Hemoglobin 12.0 - 15.0 g/dL 13.8 13.7 14.1  Hematocrit 36.0 - 46.0 % 40.9 40.9 43.4  Platelets 150 - 400 K/uL 167 200 163    Lab Results  Component Value Date/Time   VD25OH 76.94 02/18/2019 08:57 AM   VD25OH 111.67 (HH) 12/24/2018 11:15 AM    Social History   Tobacco Use  Smoking Status Never  Smokeless Tobacco Never   BP Readings from Last 3 Encounters:  01/01/21 120/80  12/11/20 (!) 181/75  12/02/20 (!) 150/88   Pulse Readings from Last 3 Encounters:  01/01/21 75  12/11/20 73  12/02/20 80   Wt Readings from Last 3 Encounters:  01/01/21 120 lb 9.6 oz (54.7 kg)  12/11/20 123 lb (55.8 kg)  12/02/20 128 lb 12.8 oz (58.4 kg)    Assessment: Review of patient past medical history, allergies, medications, health status, including review of consultants reports, laboratory and other test data, was performed as part of comprehensive evaluation and provision of chronic care management services.   SDOH:  (Social Determinants of Health) assessments and interventions performed:  SDOH Interventions    Flowsheet Row Most Recent Value  SDOH Interventions   Financial Strain Interventions Intervention Not Indicated       CCM Care Plan  Allergies  Allergen Reactions   Prilosec Otc [Omeprazole Magnesium] Other (See Comments)    Maybe cause of acute interstitial nephritis   Sucralfate Other (See Comments)    Maybe cause of acute interstitial nephritis   Amoxicillin Diarrhea   Morphine And Related Other (See Comments)    Chest pains Chest pains    Medications Reviewed Today     Reviewed by [name], RPH-CPP (Pharmacist) on 01/27/21 at 1259  Med List Status: <None>   Medication Order Taking? Sig  Documenting Provider Last Dose Status Informant  amLODipine (NORVASC) 5 MG tablet [id number] Yes Take 1 tablet (5 mg total) by mouth daily. [name], MD Taking Active   aspirin EC 81 MG tablet [id number] Yes Take 81 mg by mouth daily. [provider] Taking Active Multiple Informants  atorvastatin (LIPITOR) 80 MG tablet [id number] Yes Take 1 tablet (80 mg total) by mouth daily. [name], MD Taking Active   BD PEN NEEDLE NANO U/F 32G X 4 MM MISC [id number]  USE DAILY WITH VICTOZA AND [name], MD  Active   bisacodyl (DULCOLAX) 5 MG EC tablet [id number] Yes Take 5 mg by mouth daily as needed for moderate constipation. [provider] Taking Active   calcitRIOL (ROCALTROL) 0.25 MCG capsule [id number] Yes Take 0.25 mcg by mouth daily. [provider] Taking Active   Calcium Citrate-Vitamin D (CALCIUM CITRATE +D PO) [id number] Yes Take 600 mg by mouth daily.  [provider] Taking Active   Cholecalciferol (VITAMIN D) 50 MCG (2000 UT) CAPS [id number] Yes Take 2,000 Units by mouth daily.  [provider] Taking Active   Continuous Blood Gluc Sensor (FREESTYLE LIBRE 2 SENSOR) MISC [id number] Yes USE TO CHECK SUGAR AT [name], MD Taking Active   cyclobenzaprine (FLEXERIL) 10 MG tablet [id number] Yes TAKE 1/2 TABLET (5MG TOTAL)3 TIMES A DAY AS NEEDED    FOR MUSCLE SPASMS [name], MD Taking Active   denosumab (PROLIA) 60 MG/ML SOSY injection [id number] Yes Inject 60 mg into the skin every 6 (six) months. [provider] Taking Active            Med Note [name] May 20, 2020  2:28 PM)    DULoxetine 40 MG CPEP [id number] Yes Take 40 mg by mouth daily. [name], MD Taking Active   gabapentin (NEURONTIN) 100 MG capsule [id number] Yes Take 1 capsule (100 mg total) by mouth 3 (three) times daily. [name], MD Taking Active            Med Note [name] Jan 27, 2021 12:58 PM) 1 capsule QPM  glucosamine-chondroitin 500-400 MG tablet [id number] Yes Take 1 tablet by mouth 3 (three) times daily. [provider] Taking Active            Med Note [name], [facility] D   Tue Jun 16, 2020 11:13 AM) Reports taking once a day  glucose blood (ACCU-CHEK AVIVA PLUS) test strip [id number]  Use to check blood sugar up to 4 times daily [name], MD  Active   HYDROcodone-acetaminophen (NORCO/VICODIN) 5-325 MG tablet [id number]   [provider]  Active  Med Note [name]   Wed Oct 14, 2020  1:11 PM)    Insulin Glargine [facility] KWIKPEN) 100 UNIT/ML [id number] Yes INJECT 20 UNITS TOTAL      SUBCUTANEOUSLY DAILY. [name], MD Taking Active            Med Note Sadie Haber Dec 21, 2020 12:37 PM) Reports taking 8 units daily  Lancets MISC [id number]  Use up to 4 times daily to check blood sugars. Coral Spikes, DO  Active   liraglutide (VICTOZA) 18 MG/3ML SOPN [id number] Yes Inject 1.2 mg into the skin daily. [name], MD Taking Active            Med Note Caryl Bis, ERIC G   Fri Jan 01, 2021  2:46 PM)    losartan (COZAAR) 50 MG tablet [id number] Yes TAKE 1 TABLET DAILY [name], MD Taking Active   Melatonin 5 MG CHEW [id number] Yes Chew 5 mg by mouth daily. [provider] Taking Active   potassium chloride (K-DUR) 10 MEQ tablet [id number] No   Patient not taking: Reported on 01/27/2021   [provider] Not Taking Active            Med Note [name]   Wed Jan 27, 2021 12:59 PM)    triamcinolone ointment (KENALOG) 0.5 % [id number] No Apply 1 application topically 2 (two) times daily.  Patient not taking: Reported on 01/27/2021   [name], MD Not Taking Active             Patient Active Problem List   Diagnosis Date Noted   Lumbar radiculopathy 12/02/2020   Rash 09/29/2020   Onychomycosis 08/11/2020   Sleeping  difficulty 08/11/2020   Allergic rhinitis 08/11/2020   CPAP use counseling 06/01/2020   Dysuria 03/02/2020   Edema of lower extremity 11/19/2019   Hyperlipidemia 11/15/2019   Low back pain 11/15/2019   Dry eyes 10/01/2019   Tick bite 10/01/2019   Vomiting 10/01/2019   Personal history of other specified conditions 07/03/2019   Fall 04/15/2019   Type 2 diabetes mellitus ([place]) 03/18/2019   Hypertension 03/18/2019   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   Headache disorder 09/10/2018   Cervico-occipital neuralgia 07/23/2018   Osteoporosis 07/23/2018   Cramp and spasm 02/12/2018   Paresthesia of hand 10/25/2017   Lesion of rectum 09/07/2017   Loss of height 08/15/2017   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   Cervical spondylosis without myelopathy 08/15/2017   DDD (degenerative disc disease), cervical 08/15/2017   Bursitis of hip 08/15/2017   Female stress incontinence 08/15/2017   Muscle weakness 08/15/2017   Neck pain 08/15/2017   Spondylolisthesis, congenital 08/15/2017   Radial styloid tenosynovitis 08/15/2017   Senile osteoporosis 08/15/2017   Hip pain 08/15/2017   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   Rectal hemorrhage 07/25/2017   Essential tremor 04/26/2017   Other fatigue 03/22/2017   Left ventricular hypertrophy 10/12/2016   Chronic kidney disease, stage 4 (severe) ([place]) 08/26/2016   OSA on CPAP 08/26/2016    Immunization History  Administered Date(s) Administered   Fluad Quad(high Dose 65+) 02/25/2019, 03/02/2020   Influenza, High Dose Seasonal PF 02/24/2017, 02/28/2018   Influenza,inj,quad, With Preservative 02/19/2016   Moderna Sars-Covid-2 Vaccination 07/05/2019, 08/12/2019, 03/02/2020, 08/10/2020   Td 02/26/2019   Zoster Recombinat (Shingrix) 01/02/2020, 03/10/2020    Conditions to be addressed/monitored: HTN, HLD, and DMII  Care Plan : Medication Management  Updates  made by [name], RPH-CPP since  01/27/2021 12:00 AM     Problem: Diabetes, CKD, HTN, HLD      Long-Range Goal: Disease Progression Prevention   Start Date: 10/14/2020  This Visit's Progress: On track  Recent Progress: On track  Priority: High  Note:   Current Barriers:  Unable to achieve control of diabetes  Unable to independently control mental health diagnoses   Pharmacist Clinical Goal(s):  Over the next 90 days, patient will achieve adherence to monitoring guidelines and medication adherence to achieve therapeutic efficacy. Over the next 90 days, patient will a achieve control of diabetes as evidenced by A1c through collaboration with PharmD and provider.    Interventions: 1:1 collaboration with [name], MD regarding development and update of comprehensive plan of care as evidenced by provider attestation and co-signature Inter-disciplinary care team collaboration (see longitudinal plan of care) Comprehensive medication review performed; medication list updated in electronic medical record  Health Maintenance Yearly diabetic eye exam: due - report she has an appointment scheduled in December Yearly diabetic foot exam: due - placed reminder in upcoming PCP appointment notes Urine microalbumin: up to date Yearly influenza vaccination: due - recommended to pursue at local pharmacy or here in the office Td/Tdap vaccination: up to date Pneumonia vaccination: up to date COVID vaccinations: due - recommended to pursue bivalent booster Shingrix vaccinations: up to date Colonoscopy: up to date Bone density scan: up to date Mammogram: up to date  Diabetes: At goal <8% given history of hypoglycemia with dose adjustments; current treatment: Basaglar 8 units daily, Victoza 1.2 mg daily Hx nausea, vomiting with Ozempic. GI upset with higher dose Victoza.  Would avoid reinitiation of metformin given eGFR fluctuating around 30 Current meal patterns: breakfast: ham biscuit (2 for $4 at [place]), cereal  (cornflakes); but eats 1 lunch, supper: pimiento cheese sandwiches, chicken salad sandwiches, spaghetti; drinks: only drinks a soda once in a while, no more than 3 per week; desserts: sugar free pudding;  Current glucose readings: using Libre 2 CGM, but forgot to bring with her today; reports that fastings are generally 120-130s; post prandial: after supper highest, ~260s; after breakfast and lunch are better, ~150s Discussed initiation of SGLT2, as last eGFR with nephrology appropriate for initiation. Counseled on SGLT2, including mechanism of action, side effects, and benefits. Discussed potential side effects of dehydration, genitourinary infections. Encouraged adequate hydration and genital hygiene. Advised on sick day rules (if a day with significantly reduced oral intake, serious vomiting, or diarrhea, hold SGLT2). Patient verbalized understanding.  Start Farxiga 5 mg daily. Provided with free 30 day supply voucher to take to pharmacy. She will contact me in 3 weeks to report if she is tolerating and script can be sent to her mail order pharmacy.  Extensive dietary education. Reviewed minimization of carbohydrate choices, recommended incorporating protein into every meal.   Hypertension: Controlled per last clinic BP; current treatment: amlodipine 5 mg daily, losartan 50 mg daily Furosemide 40 mg + potassium 10 mEq PRN lower extremity edema (and "fluid eye bags"); has not used in a very long time Recommended to continue current regimen at this time. Continue to follow w/ RN CM for disease management support and education.    Hyperlipidemia, ASCVD risk reduction Controlled per last lipid panel; current treatment: atorvastatin 80 mg daily Antiplatelet regimen: aspirin 81 mg daily Recommended continue current regimen.    Depression, anxiety: Improved per patient report; current regimen: duloxetine 40 mg daily Recommended to continue current regimen at  this time.    Osteoporosis w/  hypocalcemia: Appropriately treated w/ Prolia Q6 months;  Supplements: calcium 1200 mg + Vitamin D 1000 units daily; calcitriol 0.25 mcg daily per nephrology  Recommended to continue current regimen at this time   Chronic Pain - Degenerative Disc Disease, Arthritis: Well controlled at this time; current regimen: gabapentin 100 mg BID, but notes that she is only taking 1 dose QPM, PRN hydrocodone/APAP 5/325 mg PRN- no more than once daily per pain management; cyclobenzaprine 10 mg - no more than once weekly; bisacodyl 5 mg daily Reports she has a script for naloxone at home in case she needs this.  Recommended to continue current regimen at this time. Continue to monitor for increased sedation w/ age.   Patient Goals/Self-Care Activities Over the next 90 days, patient will:  - take medications as prescribed - check glucose at least four times daily using continuous glucose monitor, document, and provide at future appointments - target a minimum of 150 minutes of moderate intensity exercise weekly - start keeping a food diary to better monitor impact of dietary choices   Follow Up Plan: Face to Face appointment with care management team member scheduled for:  ~ 8 weeks     Medication Assistance: None required.  Patient affirms current coverage meets needs.  Patient's preferred pharmacy is:  [facility], [address] [place] [address] Phone: [phone] Fax: [place], [address] [place] [address] Phone: [phone] Fax: [phone]    Follow Up:  Patient agrees to Care Plan and Follow-up.  Plan: Face to Face appointment with care management team member scheduled for: ~ 8 weeks  [name], PharmD, [place], [place] Clinical Pharmacist [employer] at [employer] [phone]

## 2021-01-27 NOTE — Patient Instructions (Signed)
Kelly [name],   Start Farxiga 5 mg daily. Take this medication in the morning, as it can cause more frequent urination with more sugar in the urine. It may worsen risk for dehydration or genital infections. Focus on staying well hydrated and using good genital hygiene. Stop the medication and call our office if you develop any symptoms of genital infections, such as burning, itching, or pain while urinating or itching with redness that could be a yeast infection. If you have a day that you are vomiting or having diarrhea and you are very dehydrated, please hold this medication until you feel better.   Continue Basaglar 8 units daily and Victoza 1.2 mg daily. Please call me if you start to develop low blood sugars.   Call me in 3 weeks and let me know how you are doing so that I can send a prescription to your mail order pharmacy.   I'll see you before you see Dr. Caryl Bis in November.   Please schedule your yearly diabetic eye exam.   We recommend you get the influenza vaccine for this season.   We recommend you get the updated bivalent COVID-19 booster, at least 2 months after any prior doses. You may consider delaying a booster dose by 3 months from a prior episode of COVID-19 per the CDC.   You can find pharmacies that have this formulation in stock at [email or web address].   [name], PharmD  Visit Information  PATIENT GOALS:  Goals Addressed               This Visit's Progress     Patient Stated     PharmD - Patient Self Monitoring (pt-stated)        Patient Goals/Self-Care Activities Over the next 90 days, patient will:  - take medications as prescribed - check glucose at least three times daily using continuous glucose monitor, document, and provide at future appointments - target a minimum of 150 minutes of moderate intensity exercise weekly - start keeping a food diary to better monitor impact of dietary choices         Print copy of patient  instructions, educational materials, and care plan provided in person.  Plan: Face to Face appointment with care management team member scheduled for: ~ 8 weeks  [name], PharmD, [place], [place] Clinical Pharmacist [employer] at [employer] [phone]

## 2021-02-01 ENCOUNTER — Telehealth: Payer: Self-pay | Admitting: Family Medicine

## 2021-02-01 NOTE — Telephone Encounter (Signed)
Patient would like to get her dapagliflozin propanediol (FARXIGA) 5 MG TABS tablet refilled,but her pharmacy needs a prior authorization.

## 2021-02-02 DIAGNOSIS — M5032 Other cervical disc degeneration, mid-cervical region, unspecified level: Secondary | ICD-10-CM | POA: Diagnosis not present

## 2021-02-02 DIAGNOSIS — Z79899 Other long term (current) drug therapy: Secondary | ICD-10-CM | POA: Diagnosis not present

## 2021-02-02 DIAGNOSIS — M25519 Pain in unspecified shoulder: Secondary | ICD-10-CM | POA: Diagnosis not present

## 2021-02-02 DIAGNOSIS — M25559 Pain in unspecified hip: Secondary | ICD-10-CM | POA: Diagnosis not present

## 2021-02-02 DIAGNOSIS — M6281 Muscle weakness (generalized): Secondary | ICD-10-CM | POA: Diagnosis not present

## 2021-02-02 DIAGNOSIS — M7062 Trochanteric bursitis, left hip: Secondary | ICD-10-CM | POA: Diagnosis not present

## 2021-02-02 DIAGNOSIS — M5136 Other intervertebral disc degeneration, lumbar region: Secondary | ICD-10-CM | POA: Diagnosis not present

## 2021-02-02 DIAGNOSIS — M545 Low back pain, unspecified: Secondary | ICD-10-CM | POA: Diagnosis not present

## 2021-02-02 DIAGNOSIS — M25572 Pain in left ankle and joints of left foot: Secondary | ICD-10-CM | POA: Diagnosis not present

## 2021-02-02 DIAGNOSIS — E1165 Type 2 diabetes mellitus with hyperglycemia: Secondary | ICD-10-CM | POA: Diagnosis not present

## 2021-02-02 DIAGNOSIS — T2101XA Burn of unspecified degree of chest wall, initial encounter: Secondary | ICD-10-CM | POA: Diagnosis not present

## 2021-02-02 DIAGNOSIS — Q762 Congenital spondylolisthesis: Secondary | ICD-10-CM | POA: Diagnosis not present

## 2021-02-04 ENCOUNTER — Other Ambulatory Visit: Payer: Self-pay | Admitting: Family Medicine

## 2021-02-04 DIAGNOSIS — I10 Essential (primary) hypertension: Secondary | ICD-10-CM

## 2021-02-05 ENCOUNTER — Other Ambulatory Visit: Payer: Self-pay

## 2021-02-05 DIAGNOSIS — Z794 Long term (current) use of insulin: Secondary | ICD-10-CM

## 2021-02-05 DIAGNOSIS — E785 Hyperlipidemia, unspecified: Secondary | ICD-10-CM | POA: Diagnosis not present

## 2021-02-05 DIAGNOSIS — E1122 Type 2 diabetes mellitus with diabetic chronic kidney disease: Secondary | ICD-10-CM

## 2021-02-05 DIAGNOSIS — N184 Chronic kidney disease, stage 4 (severe): Secondary | ICD-10-CM | POA: Diagnosis not present

## 2021-02-05 DIAGNOSIS — I10 Essential (primary) hypertension: Secondary | ICD-10-CM | POA: Diagnosis not present

## 2021-02-05 MED ORDER — DAPAGLIFLOZIN PROPANEDIOL 5 MG PO TABS: 5.0000 mg | ORAL_TABLET | Freq: Every day | ORAL | 1 refills | Status: DC

## 2021-02-05 NOTE — Telephone Encounter (Signed)
I did a PA on the patients Kelly [name] and it was approved I called to inform the patient and LVM informing her that the medication was approved.  I sent refills to the mailorder.  [name],cma

## 2021-02-09 ENCOUNTER — Ambulatory Visit: Payer: Medicare Other | Admitting: Pharmacist

## 2021-02-09 ENCOUNTER — Telehealth: Payer: Self-pay | Admitting: Family Medicine

## 2021-02-09 ENCOUNTER — Other Ambulatory Visit: Payer: Self-pay

## 2021-02-09 DIAGNOSIS — E785 Hyperlipidemia, unspecified: Secondary | ICD-10-CM

## 2021-02-09 DIAGNOSIS — N184 Chronic kidney disease, stage 4 (severe): Secondary | ICD-10-CM

## 2021-02-09 DIAGNOSIS — E1122 Type 2 diabetes mellitus with diabetic chronic kidney disease: Secondary | ICD-10-CM

## 2021-02-09 DIAGNOSIS — I10 Essential (primary) hypertension: Secondary | ICD-10-CM

## 2021-02-09 MED ORDER — BASAGLAR KWIKPEN 100 UNIT/ML ~~LOC~~ SOPN: 6.0000 [IU] | PEN_INJECTOR | Freq: Every day | SUBCUTANEOUS | 0 refills | Status: DC

## 2021-02-09 MED ORDER — DAPAGLIFLOZIN PROPANEDIOL 5 MG PO TABS: 5.0000 mg | ORAL_TABLET | Freq: Every day | ORAL | 3 refills | Status: DC

## 2021-02-09 NOTE — Telephone Encounter (Signed)
Patient is having some low blood sugars. Patient had her Victoza increased and placed on Farxiga 02/05/21 at 2:41 pm -68 10/2 at 9:24 am -69 10/3 at 9:30 pm -66 10/4 at 7:43 am -69  Patient states she is not having any low symptoms. States she was informed of the low by her Elenor Legato and she would go and eat.   Please advise

## 2021-02-09 NOTE — Patient Instructions (Signed)
Visit Information  PATIENT GOALS:  Goals Addressed               This Visit's Progress     Patient Stated     PharmD - Patient Self Monitoring (pt-stated)        Patient Goals/Self-Care Activities Over the next 90 days, patient will:  - take medications as prescribed - check glucose at least three times daily using continuous glucose monitor, document, and provide at future appointments - target a minimum of 150 minutes of moderate intensity exercise weekly - start keeping a food diary to better monitor impact of dietary choices        Patient verbalizes understanding of instructions provided today and agrees to view in [place].   Plan: Telephone follow up appointment with care management team member scheduled for:  ~ 6 weeks as previously scheduled  [name], PharmD, [place], [place] Clinical Pharmacist [employer] at [employer] [phone]

## 2021-02-09 NOTE — Telephone Encounter (Signed)
A PA was done and a approval was faxed today  it is valid from 01/05/21-02/04/2022.  Eythan Jayne,cma

## 2021-02-09 NOTE — Telephone Encounter (Signed)
Patient should be taking Basaglar 8 units daily as well. Recommend to reduce Basaglar to 6 units daily.   See CCM documentation.

## 2021-02-09 NOTE — Chronic Care Management (AMB) (Signed)
**Note Kelly-Identified via Obfuscation** Chronic Care Management Pharmacy Note  02/09/2021 Name:  Kelly [name] MRN:  [id number] DOB:  01/17/1941  Subjective: Kelly [name] is an 80 y.o. year old female who is a primary patient of [name], Kelly Adam, MD.  The CCM team was consulted for assistance with disease management and care coordination needs.    Engaged with patient by telephone for  medication managemnet  in response to provider referral for pharmacy case management and/or care coordination services.   Consent to Services:  The patient was given information about Chronic Care Management services, agreed to services, and gave verbal consent prior to initiation of services.  Please see initial visit note for detailed documentation.   Patient Care Team: Kelly Haven, MD as PCP - General (Family Medicine) Kelly [name], [name] as Pharmacist (Pharmacist) Kelly Iba, MD (Nephrology) Kelly [name], DPM as Consulting Physician (Podiatry) Kelly Singleton, RN as Case Manager   Objective:  Lab Results  Component Value Date   CREATININE 2.09 (H) 12/11/2020   CREATININE 1.85 (H) 11/04/2020   CREATININE 1.85 (H) 08/11/2020    Lab Results  Component Value Date   HGBA1C 8.5 (A) 01/01/2021   Last diabetic Eye exam:  Lab Results  Component Value Date/Time   HMDIABEYEEXA No Retinopathy 12/31/2019 04:30 PM    Last diabetic Foot exam: No results found for: HMDIABFOOTEX      Component Value Date/Time   CHOL 107 04/22/2019 1055   TRIG 168.0 (H) 04/22/2019 1055   HDL 27.40 (L) 04/22/2019 1055   CHOLHDL 4 04/22/2019 1055   VLDL 33.6 04/22/2019 1055   LDLCALC 46 04/22/2019 1055   LDLDIRECT 78.0 02/18/2019 0857    Hepatic Function Latest Ref Rng & Units 04/22/2019 04/11/2019 02/18/2019  Total Protein 6.0 - 8.3 g/dL 6.0 7.1 6.1  Albumin 3.5 - 5.2 g/dL 3.8 3.9 3.8  AST 0 - 37 U/L _0 ALT 0 - 35 U/L 34 21 26  Alk Phosphatase 39 - 117 U/L 79 73 75  Total Bilirubin 0.2 - 1.2  mg/dL 0.6 0.9 0.6  Bilirubin, Direct 0.0 - 0.3 mg/dL - - 0.1    Lab Results  Component Value Date/Time   TSH 3.93 03/22/2017 10:43 AM   TSH 0.719 07/09/2016 03:12 AM    CBC Latest Ref Rng & Units 12/11/2020 11/04/2020 10/20/2019  WBC 4.0 - 10.5 K/uL 6.3 10.1 9.7  Hemoglobin 12.0 - 15.0 g/dL 13.8 13.7 14.1  Hematocrit 36.0 - 46.0 % 40.9 40.9 43.4  Platelets 150 - 400 K/uL 167 200 163    Lab Results  Component Value Date/Time   VD25OH 76.94 02/18/2019 08:57 AM   VD25OH 111.67 (HH) 12/24/2018 11:15 AM     Social History   Tobacco Use  Smoking Status Never  Smokeless Tobacco Never   BP Readings from Last 3 Encounters:  01/01/21 120/80  12/11/20 (!) 181/75  12/02/20 (!) 150/88   Pulse Readings from Last 3 Encounters:  01/01/21 75  12/11/20 73  12/02/20 80   Wt Readings from Last 3 Encounters:  01/01/21 120 lb 9.6 oz (54.7 kg)  12/11/20 123 lb (55.8 kg)  12/02/20 128 lb 12.8 oz (58.4 kg)    Assessment: Review of patient past medical history, allergies, medications, health status, including review of consultants reports, laboratory and other test data, was performed as part of comprehensive evaluation and provision of chronic care management services.   SDOH:  (Social Determinants of Health) assessments and interventions performed:  SDOH Interventions    Flowsheet Row Most Recent Value  SDOH Interventions   Financial Strain Interventions Intervention Not Indicated       CCM Care Plan  Allergies  Allergen Reactions   Prilosec Otc [Omeprazole Magnesium] Other (See Comments)    Maybe cause of acute interstitial nephritis   Sucralfate Other (See Comments)    Maybe cause of acute interstitial nephritis   Amoxicillin Diarrhea   Morphine And Related Other (See Comments)    Chest pains Chest pains    Medications Reviewed Today     Reviewed by Kelly [name], [name] (Pharmacist) on 01/27/21 at 1259  Med List Status: <None>   Medication Order Taking? Sig  Documenting Provider Last Dose Status Informant  amLODipine (NORVASC) 5 MG tablet [id number] Yes Take 1 tablet (5 mg total) by mouth daily. Kelly Haven, MD Taking Active   aspirin EC 81 MG tablet [id number] Yes Take 81 mg by mouth daily. [provider] Taking Active Multiple Informants  atorvastatin (LIPITOR) 80 MG tablet [id number] Yes Take 1 tablet (80 mg total) by mouth daily. Kelly Haven, MD Taking Active   BD PEN NEEDLE NANO U/F 32G X 4 MM MISC [id number]  USE DAILY WITH VICTOZA AND [name], MD  Active   bisacodyl (DULCOLAX) 5 MG EC tablet [id number] Yes Take 5 mg by mouth daily as needed for moderate constipation. [provider] Taking Active   calcitRIOL (ROCALTROL) 0.25 MCG capsule [id number] Yes Take 0.25 mcg by mouth daily. [provider] Taking Active   Calcium Citrate-Vitamin D (CALCIUM CITRATE +D PO) [id number] Yes Take 600 mg by mouth daily.  [provider] Taking Active   Cholecalciferol (VITAMIN D) 50 MCG (2000 UT) CAPS [id number] Yes Take 2,000 Units by mouth daily.  [provider] Taking Active   Continuous Blood Gluc Sensor (FREESTYLE LIBRE 2 SENSOR) MISC [id number] Yes USE TO CHECK SUGAR AT [place] Kelly Haven, MD Taking Active   cyclobenzaprine (FLEXERIL) 10 MG tablet [id number] Yes TAKE 1/2 TABLET (5MG TOTAL)3 TIMES A DAY AS NEEDED    FOR MUSCLE SPASMS Kelly Haven, MD Taking Active   denosumab (PROLIA) 60 MG/ML SOSY injection [id number] Yes Inject 60 mg into the skin every 6 (six) months. [provider] Taking Active            Med Note [name] May 20, 2020  2:28 PM)    DULoxetine 40 MG CPEP [id number] Yes Take 40 mg by mouth daily. Kelly Haven, MD Taking Active   gabapentin (NEURONTIN) 100 MG capsule [id number] Yes Take 1 capsule (100 mg total) by mouth 3 (three) times daily. Kelly Haven, MD Taking Active            Med Note [name] Jan 27, 2021 12:58 PM) 1 capsule QPM  glucosamine-chondroitin 500-400 MG tablet [id number] Yes Take 1 tablet by mouth 3 (three) times daily. [provider] Taking Active            Med Note [name], [facility] D   Tue Jun 16, 2020 11:13 AM) Reports taking once a day  glucose blood (ACCU-CHEK AVIVA PLUS) test strip [id number]  Use to check blood sugar up to 4 times daily Kelly Haven, MD  Active   HYDROcodone-acetaminophen (NORCO/VICODIN) 5-325 MG tablet [id number]   [provider]  Active  Med Note Kelly [name]   Wed Oct 14, 2020  1:11 PM)    Insulin Glargine [facility] KWIKPEN) 100 UNIT/ML [id number] Yes INJECT 20 UNITS TOTAL      SUBCUTANEOUSLY DAILY. Kelly Haven, MD Taking Active            Med Note Sadie Haber Dec 21, 2020 12:37 PM) Reports taking 8 units daily  Lancets MISC [id number]  Use up to 4 times daily to check blood sugars. Coral Spikes, DO  Active   liraglutide (VICTOZA) 18 MG/3ML SOPN [id number] Yes Inject 1.2 mg into the skin daily. Kelly Haven, MD Taking Active            Med Note Caryl Bis, ERIC G   Fri Jan 01, 2021  2:46 PM)    losartan (COZAAR) 50 MG tablet [id number] Yes TAKE 1 TABLET DAILY Kelly Haven, MD Taking Active   Melatonin 5 MG CHEW [id number] Yes Chew 5 mg by mouth daily. [provider] Taking Active   potassium chloride (K-DUR) 10 MEQ tablet [id number] No   Patient not taking: Reported on 01/27/2021   [provider] Not Taking Active            Med Note Kelly [name]   Wed Jan 27, 2021 12:59 PM)    triamcinolone ointment (KENALOG) 0.5 % [id number] No Apply 1 application topically 2 (two) times daily.  Patient not taking: Reported on 01/27/2021   Kelly Haven, MD Not Taking Active             Patient Active Problem List   Diagnosis Date Noted   Lumbar radiculopathy 12/02/2020   Rash 09/29/2020   Onychomycosis 08/11/2020   Sleeping  difficulty 08/11/2020   Allergic rhinitis 08/11/2020   CPAP use counseling 06/01/2020   Dysuria 03/02/2020   Edema of lower extremity 11/19/2019   Hyperlipidemia 11/15/2019   Low back pain 11/15/2019   Dry eyes 10/01/2019   Tick bite 10/01/2019   Vomiting 10/01/2019   Personal history of other specified conditions 07/03/2019   Fall 04/15/2019   Type 2 diabetes mellitus ([place]) 03/18/2019   Hypertension 03/18/2019   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   Headache disorder 09/10/2018   Cervico-occipital neuralgia 07/23/2018   Osteoporosis 07/23/2018   Cramp and spasm 02/12/2018   Paresthesia of hand 10/25/2017   Lesion of rectum 09/07/2017   Loss of height 08/15/2017   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   Cervical spondylosis without myelopathy 08/15/2017   DDD (degenerative disc disease), cervical 08/15/2017   Bursitis of hip 08/15/2017   Female stress incontinence 08/15/2017   Muscle weakness 08/15/2017   Neck pain 08/15/2017   Spondylolisthesis, congenital 08/15/2017   Radial styloid tenosynovitis 08/15/2017   Senile osteoporosis 08/15/2017   Hip pain 08/15/2017   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   Rectal hemorrhage 07/25/2017   Essential tremor 04/26/2017   Other fatigue 03/22/2017   Left ventricular hypertrophy 10/12/2016   Chronic kidney disease, stage 4 (severe) ([place]) 08/26/2016   OSA on CPAP 08/26/2016    Immunization History  Administered Date(s) Administered   Fluad Quad(high Dose 65+) 02/25/2019, 03/02/2020   Influenza, High Dose Seasonal PF 02/24/2017, 02/28/2018   Influenza,inj,quad, With Preservative 02/19/2016   Moderna Sars-Covid-2 Vaccination 07/05/2019, 08/12/2019, 03/02/2020, 08/10/2020   Td 02/26/2019   Zoster Recombinat (Shingrix) 01/02/2020, 03/10/2020    Conditions to be addressed/monitored: HTN, HLD, and DMII  Care Plan : Medication Management  Updates  made by Kelly [name], [name] since  02/09/2021 12:00 AM     Problem: Diabetes, CKD, HTN, HLD      Long-Range Goal: Disease Progression Prevention   Start Date: 10/14/2020  This Visit's Progress: On track  Recent Progress: On track  Priority: High  Note:   Current Barriers:  Unable to achieve control of diabetes  Unable to independently control mental health diagnoses   Pharmacist Clinical Goal(s):  Over the next 90 days, patient will achieve adherence to monitoring guidelines and medication adherence to achieve therapeutic efficacy. Over the next 90 days, patient will a achieve control of diabetes as evidenced by A1c through collaboration with PharmD and provider.    Interventions: 1:1 collaboration with Kelly Haven, MD regarding development and update of comprehensive plan of care as evidenced by provider attestation and co-signature Inter-disciplinary care team collaboration (see longitudinal plan of care) Comprehensive medication review performed; medication list updated in electronic medical record  Health Maintenance Yearly diabetic eye exam: due - report she has an appointment scheduled in December Yearly diabetic foot exam: due - placed reminder in upcoming PCP appointment notes Urine microalbumin: up to date Yearly influenza vaccination: due - recommended to pursue at local pharmacy or here in the office Td/Tdap vaccination: up to date Pneumonia vaccination: up to date COVID vaccinations: due - recommended to pursue bivalent booster Shingrix vaccinations: up to date Colonoscopy: up to date Bone density scan: up to date Mammogram: up to date  Diabetes: At goal <8% given history of hypoglycemia with dose adjustments; current treatment: Basaglar 8 units daily, Victoza 1.2 mg daily, Farxiga 5 mg daily  Hx nausea, vomiting with Ozempic. GI upset with higher dose Victoza.  Would avoid reinitiation of metformin given eGFR fluctuating around 30 Reports she is tolerating [place] well. Requests 90 day supply  be sent to her mail order pharmacy.  Calls to report hypoglycemic episodes in the 60s today. Asymptomatic, but Libre sensor was alerting her. Recommend reducing Basaglar to 6 units daily and contact me with continued hypoglycemia. Goal of insulin minimization and elimination moving forward.    Hypertension: Controlled per last clinic BP; current treatment: amlodipine 5 mg daily, losartan 50 mg daily Furosemide 40 mg + potassium 10 mEq PRN lower extremity edema (and "fluid eye bags"); has not used in a very long time Previously recommended to continue current regimen at this time.   Hyperlipidemia, ASCVD risk reduction Controlled per last lipid panel; current treatment: atorvastatin 80 mg daily Antiplatelet regimen: aspirin 81 mg daily Previously recommended continue current regimen.    Depression, anxiety: Improved per patient report; current regimen: duloxetine 40 mg daily Previously recommended to continue current regimen at this time.    Osteoporosis w/ hypocalcemia: Appropriately treated w/ Prolia Q6 months;  Supplements: calcium 1200 mg + Vitamin D 1000 units daily; calcitriol 0.25 mcg daily per nephrology  Previously recommended to continue current regimen at this time   Chronic Pain - Degenerative Disc Disease, Arthritis: Well controlled at this time; current regimen: gabapentin 100 mg BID, but notes that she is only taking 1 dose QPM, PRN hydrocodone/APAP 5/325 mg PRN- no more than once daily per pain management; cyclobenzaprine 10 mg - no more than once weekly; bisacodyl 5 mg daily Previously recommended to continue current regimen at this time. Continue to monitor for increased sedation w/ age.   Patient Goals/Self-Care Activities Over the next 90 days, patient will:  - take medications as prescribed - check glucose at least four times daily using  continuous glucose monitor, document, and provide at future appointments - target a minimum of 150 minutes of moderate intensity  exercise weekly - start keeping a food diary to better monitor impact of dietary choices   Follow Up Plan: Face to Face appointment with care management team member scheduled for:  ~ 6 weeks as previously scheduled     Medication Assistance: None required.  Patient affirms current coverage meets needs.  Patient's preferred pharmacy is:  [facility] [place], [place] [place] [address] Phone: [phone] Fax: [place], [address] [place] [address] Phone: [phone] Fax: [phone]    Follow Up:  Patient agrees to Care Plan and Follow-up.  Plan: Telephone follow up appointment with care management team member scheduled for:  ~ 6 weeks as previously scheduled  [name], PharmD, [place], [place] Clinical Pharmacist [employer] at [employer] [phone]

## 2021-02-12 ENCOUNTER — Other Ambulatory Visit: Payer: Self-pay | Admitting: Family Medicine

## 2021-02-15 ENCOUNTER — Telehealth: Payer: Medicare Other

## 2021-02-15 ENCOUNTER — Telehealth: Payer: Self-pay | Admitting: *Deleted

## 2021-02-15 ENCOUNTER — Telehealth: Payer: Self-pay | Admitting: Family Medicine

## 2021-02-15 NOTE — Telephone Encounter (Signed)
[facility] patient's DULoxetine 40 MG CPEPg

## 2021-02-15 NOTE — Chronic Care Management (AMB) (Signed)
  Care Management   Note  02/15/2021 Name: Kelly [name] MRN: [id number] DOB: 1940/10/11  Kelly [name] is a 80 y.o. year old female who is a primary care patient of [name], MD and is actively engaged with the care management team. I reached out to [name] by phone today to assist with re-scheduling a follow up visit with the RN Case Manager  Follow up plan: Unsuccessful telephone outreach attempt made. A HIPAA compliant phone message was left for the patient providing contact information and requesting a return call.  The care management team will reach out to the patient again over the next 7 days.  If patient returns call to provider office, please advise to call [place] at [facility]  Direct Dial: [phone]

## 2021-02-15 NOTE — Telephone Encounter (Signed)
Medication has already been refilled.

## 2021-02-17 NOTE — Chronic Care Management (AMB) (Signed)
  Care Management   Note  02/17/2021 Name: [name] [name] MRN: [id number] DOB: [birth year]  Lilyanna COURTNY BENNISON is a 80 y.o. year old female who is a primary care patient of Caryl Bis, Angela Adam, MD and is actively engaged with the care management team. I reached out to [name] by phone today to assist with re-scheduling a follow up visit with the RN Case Manager  Follow up plan: Telephone appointment with care management team member scheduled for:03/08/21  [facility]  Direct Dial: [phone]

## 2021-02-18 ENCOUNTER — Telehealth: Payer: Self-pay | Admitting: Family Medicine

## 2021-02-18 DIAGNOSIS — Z794 Long term (current) use of insulin: Secondary | ICD-10-CM

## 2021-02-18 MED ORDER — VICTOZA 18 MG/3ML ~~LOC~~ SOPN: 1.2000 mg | PEN_INJECTOR | Freq: Every day | SUBCUTANEOUS | 3 refills | Status: DC

## 2021-02-18 NOTE — Telephone Encounter (Signed)
Refill sent.

## 2021-02-18 NOTE — Telephone Encounter (Signed)
Patient is calling in to request a refill on her updated increased dosage on liraglutide (VICTOZA) 18 MG/3ML SOPN.Please send to the [facility].

## 2021-02-25 DIAGNOSIS — E1165 Type 2 diabetes mellitus with hyperglycemia: Secondary | ICD-10-CM | POA: Diagnosis not present

## 2021-02-25 DIAGNOSIS — M25559 Pain in unspecified hip: Secondary | ICD-10-CM | POA: Diagnosis not present

## 2021-02-25 DIAGNOSIS — M542 Cervicalgia: Secondary | ICD-10-CM | POA: Diagnosis not present

## 2021-02-25 DIAGNOSIS — M5032 Other cervical disc degeneration, mid-cervical region, unspecified level: Secondary | ICD-10-CM | POA: Diagnosis not present

## 2021-02-25 DIAGNOSIS — M545 Low back pain, unspecified: Secondary | ICD-10-CM | POA: Diagnosis not present

## 2021-02-25 DIAGNOSIS — T2101XA Burn of unspecified degree of chest wall, initial encounter: Secondary | ICD-10-CM | POA: Diagnosis not present

## 2021-02-25 DIAGNOSIS — M25572 Pain in left ankle and joints of left foot: Secondary | ICD-10-CM | POA: Diagnosis not present

## 2021-02-25 DIAGNOSIS — M5136 Other intervertebral disc degeneration, lumbar region: Secondary | ICD-10-CM | POA: Diagnosis not present

## 2021-02-25 DIAGNOSIS — M25519 Pain in unspecified shoulder: Secondary | ICD-10-CM | POA: Diagnosis not present

## 2021-02-25 DIAGNOSIS — Q762 Congenital spondylolisthesis: Secondary | ICD-10-CM | POA: Diagnosis not present

## 2021-02-25 DIAGNOSIS — M6281 Muscle weakness (generalized): Secondary | ICD-10-CM | POA: Diagnosis not present

## 2021-02-25 DIAGNOSIS — M7062 Trochanteric bursitis, left hip: Secondary | ICD-10-CM | POA: Diagnosis not present

## 2021-03-05 ENCOUNTER — Telehealth: Payer: Self-pay | Admitting: Family Medicine

## 2021-03-05 NOTE — Telephone Encounter (Signed)
Patient notified Prolia due in office has PCP  follow up scheduled 04/05/21 will be given at FU.

## 2021-03-08 ENCOUNTER — Ambulatory Visit (INDEPENDENT_AMBULATORY_CARE_PROVIDER_SITE_OTHER): Payer: Medicare Other | Admitting: *Deleted

## 2021-03-08 DIAGNOSIS — E785 Hyperlipidemia, unspecified: Secondary | ICD-10-CM

## 2021-03-08 DIAGNOSIS — E1122 Type 2 diabetes mellitus with diabetic chronic kidney disease: Secondary | ICD-10-CM | POA: Diagnosis not present

## 2021-03-08 DIAGNOSIS — N184 Chronic kidney disease, stage 4 (severe): Secondary | ICD-10-CM | POA: Diagnosis not present

## 2021-03-08 DIAGNOSIS — I10 Essential (primary) hypertension: Secondary | ICD-10-CM

## 2021-03-08 DIAGNOSIS — Z794 Long term (current) use of insulin: Secondary | ICD-10-CM

## 2021-03-08 NOTE — Patient Instructions (Signed)
Visit Information   PATIENT GOALS/PLAN OF CARE:  Care Plan : Diabetes Type 2 (Adult)  Updates made by Leona Singleton, RN since 03/08/2021 12:00 AM     Problem: Glycemic Management (Diabetes, Type 2)   Priority: Medium     Long-Range Goal: Patient will report decrease in Hgb A1C by 0.2 points within the next 90 days   Start Date: 06/16/2020  Expected End Date: 05/07/2021  This Visit's Progress: On track  Recent Progress: Not on track  Priority: Medium  Note:   Objective:  Lab Results  Component Value Date   HGBA1C 8.5 (A) 01/01/2021  Current Barriers:  Knowledge Deficits related to basic Diabetes pathophysiology and self care/management as evidenced by increasing Hgb A1C.  Reports continued use of her Freestyle Lilbre continuous monitoring system.  Fasting blood sugar ranges had been 90-120's elevated post steroid injection and are now <150's.   A1C increased to 8.5, discussed ways to help reduce Case Manager Clinical Goal(s):  Patient will demonstrate improved adherence to prescribed treatment plan for diabetes self care/m e to prescribed medication regimen Interventions:  Collaboration with [name], MD regarding development and update of comprehensive plan of care as evidenced by provider attestation and co-signature Inter-disciplinary care team collaboration (see longitudinal plan of care) Reviewed medications with patient and medication changes and discussed importance of medication adherence Discussed plans with patient for ongoing care management follow up and provided patient with direct contact information for care management team Discussed signs and symptoms of hypo and hyperglycemia and importance of correct treatment (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   Discussed food diary and carbohydrate modified diet, encouraged to continue to keep food diary taking to follow up appointments, encouraged healthier food options and increasing vegetable intake Discussed carbohydrate counting and diabetic diet, sent EMMI Diabetes Diet Video; encouraged to view Sent Diabetes Diet and Carbohydrate Counting handouts; encouraged to review Encouraged to continue to work with CCM Pharmacist and to contact CCM Pharmacist for sustained hyperglycemic episodes for possible medication adjustments Discussed appropriate meal portions and how to use food diary with making good food choices Discussed increasing Hgb A1C and ways to help reduce Discussed and encouraged eating bedtime snack nightly to help prevent early morning hypoglycemia Reviewed recent blood sugar values and discussed ways to help reduce ranges Discussed how steroids can affect blood sugars and encouraged patient to monitor after steroid injection Patient Goals/Self-Care Activities Over the next 30 days, patient will:  Check blood sugar at prescribed times (at least 4 times a day)  Check blood sugar if I feel it is too high or too low, calling provider for sustained highs or lows Enter blood sugar readings and medication/insulin into daily log taking to provider for review Continue to keep food diary notating what foods elevate blood sugars Try to increase fruits and vegetables in diet (eat them daily) Take medication as prescribed Follow Up Plan: The care management team will reach out to the patient again over the next 30 business days.      Care Plan : Hypertension (Adult)  Updates made by Leona Singleton, RN since 03/08/2021 12:00 AM  Problem: Hypertension (Hypertension)   Priority: Medium     Long-Range  Goal: Hypertension Monitored   Start Date: 06/16/2020  Expected End Date: 07/06/2021  This Visit's Progress: On track  Recent Progress: On track  Priority: Medium  Note:   Current Barriers:  Knowledge Deficits related to basic understanding of hypertension pathophysiology and self care management; has been monitoring blood pressures at home she says about once a week at least.  Reports pressures have been 130/70-85. States she did receive spinal injection and her back pain a little better.  "Able to walk without walker now".  Denies any light headiness or dizziness Nurse Case Manager Clinical Goal(s):  Over the next 90 days, patient will verbalize understanding of plan for hypertension management Over the next 90 days, patient will attend all scheduled medical appointments Over the next 90 days, patient will demonstrate improved adherence to prescribed treatment plan for hypertension as evidenced by taking all medications as prescribed, monitoring and recording blood pressure as directed, adhering to low sodium/DASH diet Interventions:  Collaboration with [name], MD regarding development and update of comprehensive plan of care as evidenced by provider attestation and co-signature Inter-disciplinary care team collaboration (see longitudinal plan of care) Evaluation of current treatment plan related to hypertension self management and patient's adherence to plan as established by provider. Reviewed medications with patient and discussed importance of compliance Discussed plans with patient for ongoing care management follow up and provided patient with direct contact information for care management team Advised patient, providing education and rationale, to monitor blood pressure daily and record, calling PCP for findings outside established parameters.  Reviewed scheduled/upcoming provider appointments including: encouraged to contact PCP if rash does not clear in the next 2  weeks Discussed sleep and sleep pattern and encouraged patient to stay awake most of the day, avoid cell phone/screen time at bedtime, trying to decrease stimuli at bedtime Instructed and encouraged patient to place Calendar near medications or blood pressure cuff for ease of logging pressures when she takes them Encouraged to increase activity as tolerated, fall precautions and preventions reviewed and discussed Reviewed signs and symptoms of hypertension and encouraged patient to continue to self monitor Again discussed and encouraged to use 2022 [place] to log vital signs for provider review Emotional support and empathy provided to patient; congratulated on completing outpatient therapy Patient Goals/Self-Care Activities:  Over the next 30 days, patient will: Check blood pressure at least weekly Write blood pressure results in a log or diary and take to medical appointments Keep log near medications and blood pressure machine to remind yourself to write down vitals in log Continue to monitor self for signs and symptoms of high blood pressure (swelling in extremities, headache, etc) Increase activity as tolerated  Fall precautions and preventions, consider using walker I help with pain Follow Up Plan: The care management team will reach out to the patient again over the next 30 business days.       Consent to CCM Services: Ms. Mancebo was given information about Chronic Care Management services including:  CCM service includes personalized support from designated clinical staff supervised by her physician, including individualized plan of care and coordination with other care providers 24/7 contact phone numbers for assistance for urgent and routine care needs. Service will only be billed when office clinical staff spend 20 minutes or more in a month to coordinate care. Only one practitioner may furnish and bill the service in a calendar month. The patient may stop  CCM services  at any time (effective at the end of the month) by phone call to the office staff. The patient will be responsible for cost sharing (co-pay) of up to 20% of the service fee (after annual deductible is met).  Patient agreed to services and verbal consent obtained.   Patient verbalizes understanding of instructions provided today and agrees to view in [place].   The care management team will reach out to the patient again over the next 30 days.   Hubert Azure RN, MSN RN Care Management Coordinator [place] [phone] [email or web address]

## 2021-03-08 NOTE — Chronic Care Management (AMB) (Signed)
Chronic Care Management   CCM RN Visit Note  03/08/2021 Name: Kelly [name] MRN: [id number] DOB: July 16, 1940  Subjective: Kelly [name] is a 80 y.o. year old female who is a primary care patient of Caryl Bis, Angela Adam, MD. The care management team was consulted for assistance with disease management and care coordination needs.    Engaged with patient by telephone for follow up visit in response to provider referral for case management and/or care coordination services.   Consent to Services:  The patient was given information about Chronic Care Management services, agreed to services, and gave verbal consent prior to initiation of services.  Please see initial visit note for detailed documentation.   Patient agreed to services and verbal consent obtained.   Assessment: Review of patient past medical history, allergies, medications, health status, including review of consultants reports, laboratory and other test data, was performed as part of comprehensive evaluation and provision of chronic care management services.   SDOH (Social Determinants of Health) assessments and interventions performed:    CCM Care Plan  Allergies  Allergen Reactions   Prilosec Otc [Omeprazole Magnesium] Other (See Comments)    Maybe cause of acute interstitial nephritis   Sucralfate Other (See Comments)    Maybe cause of acute interstitial nephritis   Amoxicillin Diarrhea   Morphine And Related Other (See Comments)    Chest pains Chest pains    Outpatient Encounter Medications as of 03/08/2021  Medication Sig Note   amLODipine (NORVASC) 5 MG tablet TAKE 1 TABLET DAILY    aspirin EC 81 MG tablet Take 81 mg by mouth daily.    atorvastatin (LIPITOR) 80 MG tablet Take 1 tablet (80 mg total) by mouth daily.    BD PEN NEEDLE NANO U/F 32G X 4 MM MISC USE DAILY WITH VICTOZA AND BASAGLAR    bisacodyl (DULCOLAX) 5 MG EC tablet Take 5 mg by mouth daily as needed for moderate constipation.     calcitRIOL (ROCALTROL) 0.25 MCG capsule Take 0.25 mcg by mouth daily.    Calcium Citrate-Vitamin D (CALCIUM CITRATE +D PO) Take 600 mg by mouth daily.     Cholecalciferol (VITAMIN D) 50 MCG (2000 UT) CAPS Take 2,000 Units by mouth daily.     Continuous Blood Gluc Sensor (FREESTYLE LIBRE 2 SENSOR) MISC USE TO CHECK SUGAR AT LEAST4 TIMES A DAY    cyclobenzaprine (FLEXERIL) 10 MG tablet TAKE 1/2 TABLET (5MG  TOTAL)3 TIMES A DAY AS NEEDED    FOR MUSCLE SPASMS    dapagliflozin propanediol (FARXIGA) 5 MG TABS tablet Take 1 tablet (5 mg total) by mouth daily.    denosumab (PROLIA) 60 MG/ML SOSY injection Inject 60 mg into the skin every 6 (six) months.    DULoxetine HCl 40 MG CPEP TAKE 1 CAPSULE DAILY    gabapentin (NEURONTIN) 100 MG capsule Take 1 capsule (100 mg total) by mouth 3 (three) times daily. 01/27/2021: 1 capsule QPM   glucosamine-chondroitin 500-400 MG tablet Take 1 tablet by mouth 3 (three) times daily. 06/16/2020: Reports taking once a day   glucose blood (ACCU-CHEK AVIVA PLUS) test strip Use to check blood sugar up to 4 times daily    HYDROcodone-acetaminophen (NORCO/VICODIN) 5-325 MG tablet     Insulin Glargine (BASAGLAR KWIKPEN) 100 UNIT/ML Inject 6 Units into the skin daily.    Lancets MISC Use up to 4 times daily to check blood sugars.    liraglutide (VICTOZA) 18 MG/3ML SOPN Inject 1.2 mg into the skin daily.  losartan (COZAAR) 50 MG tablet TAKE 1 TABLET DAILY    Melatonin 5 MG CHEW Chew 5 mg by mouth daily.    potassium chloride (K-DUR) 10 MEQ tablet  (Patient not taking: Reported on 01/27/2021)    triamcinolone ointment (KENALOG) 0.5 % Apply 1 application topically 2 (two) times daily. (Patient not taking: Reported on 01/27/2021)    No facility-administered encounter medications on file as of 03/08/2021.    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   Headache disorder 09/10/2018   Cervico-occipital neuralgia 07/23/2018   Osteoporosis 07/23/2018   Cramp and spasm 02/12/2018   Paresthesia of hand 10/25/2017   Lesion of rectum 09/07/2017   Loss of height 08/15/2017   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   Cervical spondylosis without myelopathy 08/15/2017   DDD (degenerative disc disease), cervical 08/15/2017   Bursitis of hip 08/15/2017   Female stress incontinence 08/15/2017   Muscle weakness 08/15/2017   Neck pain 08/15/2017   Spondylolisthesis, congenital 08/15/2017   Radial styloid tenosynovitis 08/15/2017   Senile osteoporosis 08/15/2017   Hip pain 08/15/2017   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   Rectal hemorrhage 07/25/2017   Essential tremor 04/26/2017   Other fatigue 03/22/2017   Left ventricular hypertrophy 10/12/2016   Chronic kidney disease, stage 4 (severe) ([place]) 08/26/2016   OSA on CPAP 08/26/2016    Conditions to be addressed/monitored:HTN and DMII  Care Plan : Diabetes Type 2 (Adult)  Updates made by Leona Singleton, RN since 03/08/2021 12:00 AM     Problem: Glycemic Management (Diabetes, Type 2)   Priority: Medium     Long-Range Goal: Patient will report decrease in Hgb A1C by 0.2 points within the next 90 days   Start Date: 06/16/2020  Expected End Date: 05/07/2021  This Visit's Progress: On track  Recent Progress: Not on track  Priority: Medium  Note:   Objective:  Lab Results  Component Value Date   HGBA1C 8.5 (A) 01/01/2021   Current Barriers:  Knowledge Deficits related to basic Diabetes pathophysiology and self care/management as evidenced by increasing Hgb A1C.  Reports continued use of her Freestyle Lilbre continuous monitoring system.  Fasting blood sugar ranges had been 90-120's elevated post steroid injection and are now <150's.   A1C increased to 8.5, discussed ways to help reduce Case Manager Clinical Goal(s):  Patient will demonstrate improved adherence to prescribed treatment plan for diabetes self care/m e to prescribed medication regimen Interventions:  Collaboration with [name], MD regarding development and update of comprehensive plan of care as evidenced by provider attestation and co-signature Inter-disciplinary care team collaboration (see longitudinal plan of care) Reviewed medications with patient and medication changes and discussed importance of medication adherence Discussed plans with patient for ongoing care management follow up and provided patient with direct contact information for care management team Discussed signs and symptoms of hypo and hyperglycemia and importance of correct treatment (not  to 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   Discussed food diary and carbohydrate modified diet, encouraged to continue to keep food diary taking to follow up appointments, encouraged healthier food options and increasing vegetable intake Discussed carbohydrate counting and diabetic diet, sent EMMI Diabetes Diet Video; encouraged to view Sent Diabetes Diet and Carbohydrate Counting handouts; encouraged to  review Encouraged to continue to work with CCM Pharmacist and to contact CCM Pharmacist for sustained hyperglycemic episodes for possible medication adjustments Discussed appropriate meal portions and how to use food diary with making good food choices Discussed increasing Hgb A1C and ways to help reduce Discussed and encouraged eating bedtime snack nightly to help prevent early morning hypoglycemia Reviewed recent blood sugar values and discussed ways to help reduce ranges Discussed how steroids can affect blood sugars and encouraged patient to monitor after steroid injection Patient Goals/Self-Care Activities Over the next 30 days, patient will:  Check blood sugar at prescribed times (at least 4 times a day)  Check blood sugar if I feel it is too high or too low, calling provider for sustained highs or lows Enter blood sugar readings and medication/insulin into daily log taking to provider for review Continue to keep food diary notating what foods elevate blood sugars Try to increase fruits and vegetables in diet (eat them daily) Take medication as prescribed Follow Up Plan: The care management team will reach out to the patient again over the next 30 business days.      Care Plan : Hypertension (Adult)  Updates made by Leona Singleton, RN since 03/08/2021 12:00 AM     Problem: Hypertension (Hypertension)   Priority: Medium     Long-Range Goal: Hypertension Monitored   Start Date: 06/16/2020  Expected End Date: 07/06/2021  This Visit's Progress: On track  Recent Progress: On track  Priority: Medium  Note:   Current Barriers:  Knowledge Deficits related to basic understanding of hypertension pathophysiology and self care management; has been monitoring blood pressures at home she says about once a week at least.  Reports pressures have been 130/70-85. States she did receive spinal injection and her back pain a little better.  "Able to walk without walker now".  Denies any light  headiness or dizziness Nurse Case Manager Clinical Goal(s):  Over the next 90 days, patient will verbalize understanding of plan for hypertension management Over the next 90 days, patient will attend all scheduled medical appointments Over the next 90 days, patient will demonstrate improved adherence to prescribed treatment plan for hypertension as evidenced by taking all medications as prescribed, monitoring and recording blood pressure as directed, adhering to low sodium/DASH diet Interventions:  Collaboration with [name], MD regarding development and update of comprehensive plan of care as evidenced by provider attestation and co-signature Inter-disciplinary care team collaboration (see longitudinal plan of care) Evaluation of current treatment plan related to hypertension self management and patient's adherence to plan as established by provider. Reviewed medications with patient and discussed importance of compliance Discussed plans with patient for ongoing care management follow up and provided patient with direct contact information for care management team Advised patient, providing education and rationale, to monitor  blood pressure daily and record, calling PCP for findings outside established parameters.  Reviewed scheduled/upcoming provider appointments including: encouraged to contact PCP if rash does not clear in the next 2 weeks Discussed sleep and sleep pattern and encouraged patient to stay awake most of the day, avoid cell phone/screen time at bedtime, trying to decrease stimuli at bedtime Instructed and encouraged patient to place Calendar near medications or blood pressure cuff for ease of logging pressures when she takes them Encouraged to increase activity as tolerated, fall precautions and preventions reviewed and discussed Reviewed signs and symptoms of hypertension and encouraged patient to continue to self monitor Again discussed and encouraged to use 2022 [place] to log vital signs for provider review Emotional support and empathy provided to patient; congratulated on completing outpatient therapy Patient Goals/Self-Care Activities:  Over the next 30 days, patient will: Check blood pressure at least weekly Write blood pressure results in a log or diary and take to medical appointments Keep log near medications and blood pressure machine to remind yourself to write down vitals in log Continue to monitor self for signs and symptoms of high blood pressure (swelling in extremities, headache, etc) Increase activity as tolerated  Fall precautions and preventions, consider using walker I help with pain Follow Up Plan: The care management team will reach out to the patient again over the next 30 business days.       Plan:The care management team will reach out to the patient again over the next 30 days.  Hubert Azure RN, MSN RN Care Management Coordinator [place] [phone] [email or web address]

## 2021-03-17 DIAGNOSIS — Z20828 Contact with and (suspected) exposure to other viral communicable diseases: Secondary | ICD-10-CM | POA: Diagnosis not present

## 2021-03-24 DIAGNOSIS — H903 Sensorineural hearing loss, bilateral: Secondary | ICD-10-CM | POA: Diagnosis not present

## 2021-03-30 DIAGNOSIS — M7062 Trochanteric bursitis, left hip: Secondary | ICD-10-CM | POA: Diagnosis not present

## 2021-03-30 DIAGNOSIS — M25559 Pain in unspecified hip: Secondary | ICD-10-CM | POA: Diagnosis not present

## 2021-03-30 DIAGNOSIS — M5032 Other cervical disc degeneration, mid-cervical region, unspecified level: Secondary | ICD-10-CM | POA: Diagnosis not present

## 2021-03-30 DIAGNOSIS — M25572 Pain in left ankle and joints of left foot: Secondary | ICD-10-CM | POA: Diagnosis not present

## 2021-03-30 DIAGNOSIS — E1165 Type 2 diabetes mellitus with hyperglycemia: Secondary | ICD-10-CM | POA: Diagnosis not present

## 2021-03-30 DIAGNOSIS — T2101XA Burn of unspecified degree of chest wall, initial encounter: Secondary | ICD-10-CM | POA: Diagnosis not present

## 2021-03-30 DIAGNOSIS — Z79891 Long term (current) use of opiate analgesic: Secondary | ICD-10-CM | POA: Diagnosis not present

## 2021-03-30 DIAGNOSIS — M5136 Other intervertebral disc degeneration, lumbar region: Secondary | ICD-10-CM | POA: Diagnosis not present

## 2021-03-30 DIAGNOSIS — Z5181 Encounter for therapeutic drug level monitoring: Secondary | ICD-10-CM | POA: Diagnosis not present

## 2021-03-30 DIAGNOSIS — M542 Cervicalgia: Secondary | ICD-10-CM | POA: Diagnosis not present

## 2021-03-30 DIAGNOSIS — Q762 Congenital spondylolisthesis: Secondary | ICD-10-CM | POA: Diagnosis not present

## 2021-03-30 DIAGNOSIS — M25519 Pain in unspecified shoulder: Secondary | ICD-10-CM | POA: Diagnosis not present

## 2021-03-30 DIAGNOSIS — M6281 Muscle weakness (generalized): Secondary | ICD-10-CM | POA: Diagnosis not present

## 2021-03-30 DIAGNOSIS — M545 Low back pain, unspecified: Secondary | ICD-10-CM | POA: Diagnosis not present

## 2021-04-05 ENCOUNTER — Ambulatory Visit: Payer: Medicare Other | Admitting: Family Medicine

## 2021-04-05 ENCOUNTER — Ambulatory Visit: Payer: Medicare Other

## 2021-04-05 ENCOUNTER — Ambulatory Visit (INDEPENDENT_AMBULATORY_CARE_PROVIDER_SITE_OTHER): Payer: Medicare Other

## 2021-04-05 ENCOUNTER — Other Ambulatory Visit: Payer: Self-pay

## 2021-04-05 DIAGNOSIS — M810 Age-related osteoporosis without current pathological fracture: Secondary | ICD-10-CM | POA: Diagnosis not present

## 2021-04-05 DIAGNOSIS — E119 Type 2 diabetes mellitus without complications: Secondary | ICD-10-CM | POA: Diagnosis not present

## 2021-04-05 LAB — HM DIABETES EYE EXAM

## 2021-04-05 MED ORDER — DENOSUMAB 60 MG/ML ~~LOC~~ SOSY
60.0000 mg | PREFILLED_SYRINGE | SUBCUTANEOUS | Status: AC
  Administered 2021-04-05: 13:00:00 60 mg via SUBCUTANEOUS

## 2021-04-05 NOTE — Progress Notes (Addendum)
Patient presented for Prolia injection. Administered to the back of Patient's left upper arm.   Patient states this is not her first Prolia, no previous reaction to injection. Patient made aware to contact the office should any symptoms arise. Patient tolerated the injection well.

## 2021-04-07 ENCOUNTER — Telehealth: Payer: Medicare Other

## 2021-04-09 ENCOUNTER — Ambulatory Visit (INDEPENDENT_AMBULATORY_CARE_PROVIDER_SITE_OTHER): Payer: Medicare Other | Admitting: *Deleted

## 2021-04-09 DIAGNOSIS — N184 Chronic kidney disease, stage 4 (severe): Secondary | ICD-10-CM

## 2021-04-09 DIAGNOSIS — E1122 Type 2 diabetes mellitus with diabetic chronic kidney disease: Secondary | ICD-10-CM

## 2021-04-09 DIAGNOSIS — I10 Essential (primary) hypertension: Secondary | ICD-10-CM

## 2021-04-09 NOTE — Patient Instructions (Addendum)
Visit Information  Thank you for taking time to visit with me today. Please don't hesitate to contact me if I can be of assistance to you before our next scheduled telephone appointment.  Following are the goals we discussed today:  Check blood sugar at prescribed times (at least 4 times a day)  Check blood sugar if I feel it is too high or too low, calling provider for sustained highs or lows Enter blood sugar readings and medication/insulin into daily log taking to provider for review Continue to keep food diary notating what foods elevate blood sugars Try to increase fruits and vegetables in diet (eat them daily) Take medication as prescribed Take blood pressure at least weekly and notify provider for elevations Call CCM Pharm Catie for elevated blood sugars to discuss adjustment of medication  Our next appointment is by telephone on 04/30/21 at 1300  Please call the care guide team at [phone] if you need to cancel or reschedule your appointment.   If you are experiencing a Mental Health or [place] or need someone to talk to, please call the Suicide and Crisis Lifeline: 988 call the [place] National Suicide Prevention Lifeline: 431-089-8131 or TTY: 256-841-8676 TTY 9305857253) to talk to a trained counselor call 1-800-273-TALK (toll free, 24 hour hotline) call 911   Patient verbalizes understanding of instructions provided today and agrees to view in [place].   Hubert Azure RN, MSN RN Care Management Coordinator [place] [phone] [email or web address]

## 2021-04-11 NOTE — Chronic Care Management (AMB) (Signed)
Chronic Care Management   CCM RN Visit Note  04/11/2021 Name: Kelly [name] MRN: [id number] DOB: [birth year]  Subjective: Kelly [name] is a 80 y.o. year old female who is a primary care patient of Kelly [name], Kelly Adam, MD. The care management team was consulted for assistance with disease management and care coordination needs.    Engaged with patient by telephone for follow up visit in response to provider referral for case management and/or care coordination services.   Consent to Services:  The patient was given information about Chronic Care Management services, agreed to services, and gave verbal consent prior to initiation of services.  Please see initial visit note for detailed documentation.   Patient agreed to services and verbal consent obtained.   Assessment: Review of patient past medical history, allergies, medications, health status, including review of consultants reports, laboratory and other test data, was performed as part of comprehensive evaluation and provision of chronic care management services.   SDOH (Social Determinants of Health) assessments and interventions performed:    CCM Care Plan  Allergies  Allergen Reactions   Prilosec Otc [Omeprazole Magnesium] Other (See Comments)    Maybe cause of acute interstitial nephritis   Sucralfate Other (See Comments)    Maybe cause of acute interstitial nephritis   Amoxicillin Diarrhea   Morphine And Related Other (See Comments)    Chest pains Chest pains    Outpatient Encounter Medications as of 04/09/2021  Medication Sig Note   amLODipine (NORVASC) 5 MG tablet TAKE 1 TABLET DAILY    aspirin EC 81 MG tablet Take 81 mg by mouth daily.    atorvastatin (LIPITOR) 80 MG tablet Take 1 tablet (80 mg total) by mouth daily.    BD PEN NEEDLE NANO U/F 32G X 4 MM MISC USE DAILY WITH VICTOZA AND BASAGLAR    bisacodyl (DULCOLAX) 5 MG EC tablet Take 5 mg by mouth daily as needed for moderate constipation.     calcitRIOL (ROCALTROL) 0.25 MCG capsule Take 0.25 mcg by mouth daily.    Calcium Citrate-Vitamin D (CALCIUM CITRATE +D PO) Take 600 mg by mouth daily.     Cholecalciferol (VITAMIN D) 50 MCG (2000 UT) CAPS Take 2,000 Units by mouth daily.     Continuous Blood Gluc Sensor (FREESTYLE LIBRE 2 SENSOR) MISC USE TO CHECK SUGAR AT LEAST4 TIMES A DAY    cyclobenzaprine (FLEXERIL) 10 MG tablet TAKE 1/2 TABLET (5MG TOTAL)3 TIMES A DAY AS NEEDED    FOR MUSCLE SPASMS    dapagliflozin propanediol (FARXIGA) 5 MG TABS tablet Take 1 tablet (5 mg total) by mouth daily.    denosumab (PROLIA) 60 MG/ML SOSY injection Inject 60 mg into the skin every 6 (six) months.    DULoxetine HCl 40 MG CPEP TAKE 1 CAPSULE DAILY    gabapentin (NEURONTIN) 100 MG capsule Take 1 capsule (100 mg total) by mouth 3 (three) times daily. 01/27/2021: 1 capsule QPM   glucosamine-chondroitin 500-400 MG tablet Take 1 tablet by mouth 3 (three) times daily. 06/16/2020: Reports taking once a day   glucose blood (ACCU-CHEK AVIVA PLUS) test strip Use to check blood sugar up to 4 times daily    HYDROcodone-acetaminophen (NORCO/VICODIN) 5-325 MG tablet     Insulin Glargine (BASAGLAR KWIKPEN) 100 UNIT/ML Inject 6 Units into the skin daily.    Lancets MISC Use up to 4 times daily to check blood sugars.    liraglutide (VICTOZA) 18 MG/3ML SOPN Inject 1.2 mg into the skin daily.  losartan (COZAAR) 50 MG tablet TAKE 1 TABLET DAILY    Melatonin 5 MG CHEW Chew 5 mg by mouth daily.    potassium chloride (K-DUR) 10 MEQ tablet  (Patient not taking: Reported on 01/27/2021)    triamcinolone ointment (KENALOG) 0.5 % Apply 1 application topically 2 (two) times daily. (Patient not taking: Reported on 01/27/2021)    Facility-Administered Encounter Medications as of 04/09/2021  Medication   denosumab (PROLIA) injection 60 mg    Patient Active Problem List   Diagnosis Date Noted   Trochanteric bursitis, right hip 12/08/2020   Foraminal stenosis of lumbosacral  region 12/08/2020   Lumbar herniated disc 12/08/2020   Lumbar radiculopathy 12/02/2020   Rash 09/29/2020   Onychomycosis 08/11/2020   Sleeping difficulty 08/11/2020   Allergic rhinitis 08/11/2020   CPAP use counseling 06/01/2020   Dysuria 03/02/2020   Edema of lower extremity 11/19/2019   Hyperlipidemia 11/15/2019   Low back pain 11/15/2019   Dry eyes 10/01/2019   Tick bite 10/01/2019   Vomiting 10/01/2019   Personal history of other specified conditions 07/03/2019   Fall 04/15/2019   Type 2 diabetes mellitus ([place]) 03/18/2019   Hypertension 03/18/2019   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/18/2019   Headache disorder 09/10/2018   Cervico-occipital neuralgia 07/23/2018   Osteoporosis 07/23/2018   Cramp and spasm 02/12/2018   Paresthesia of hand 10/25/2017   Lesion of rectum 09/07/2017   Loss of height 08/15/2017   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   Cervical spondylosis without myelopathy 08/15/2017   DDD (degenerative disc disease), cervical 08/15/2017   Bursitis of hip 08/15/2017   Female stress incontinence 08/15/2017   Muscle weakness 08/15/2017   Neck pain 08/15/2017   Spondylolisthesis, congenital 08/15/2017   Radial styloid tenosynovitis 08/15/2017   Senile osteoporosis 08/15/2017   Hip pain 08/15/2017   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   Rectal hemorrhage 07/25/2017   Essential tremor 04/26/2017   Other fatigue 03/22/2017   Left ventricular hypertrophy 10/12/2016   Chronic kidney disease, stage 4 (severe) ([place]) 08/26/2016   OSA on CPAP 08/26/2016    Conditions to be addressed/monitored:HTN and DMII  Care Plan : Diabetes Type 2 (Adult)  Updates made by Kelly Singleton, RN since 04/11/2021 12:00 AM     Problem: Glycemic (Diabetes, Type 2)  & HTN Management   Priority: Medium     Long-Range Goal: Patient will report decrease in Hgb A1C by 0.2 points within the next 90 days   Start Date: 06/16/2020  Expected End Date:  05/07/2021  Recent Progress: On track  Priority: Medium  Note:   Objective:  Lab Results  Component Value Date   HGBA1C 8.5 (A) 01/01/2021  Current Barriers:  Knowledge Deficits related to basic Diabetes pathophysiology and self care/management as evidenced by increasing Hgb A1C.  Reports continued use of her Freestyle Lil[place] continuous monitoring system.  Fasting blood sugar this morning was 160 then 319 prior to lunch.  Last  A1C increased to 8.5, discussed ways to help reduce.  Very short conversation as patient was heading out door for appointment. Case Manager Clinical Goal(s):  Patient will demonstrate improved adherence to prescribed treatment plan for diabetes self care/m e to prescribed medication regimen   Diabetes: (Status: Goal on track: No)  Long Term Goal Interventions:  Collaboration with Kelly Haven, MD regarding development and update of comprehensive plan of care as evidenced by provider attestation and co-signature Inter-disciplinary care team collaboration (see longitudinal plan of care) Reviewed medications with  patient and medication changes and discussed importance of medication adherence Discussed plans with patient for ongoing care management follow up and provided patient with direct contact information for care management team Discussed signs and symptoms of hypo and hyperglycemia and importance of correct treatment (not to 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   Discussed food diary and carbohydrate modified diet, encouraged to continue to keep food diary taking to follow up  appointments, encouraged healthier food options and increasing vegetable intake Discussed carbohydrate counting and diabetic diet, sent EMMI Diabetes Diet Video; encouraged to view Sent Diabetes Diet and Carbohydrate Counting handouts; encouraged to review Instructed and Encouraged to continue to work with CCM Pharmacist and to contact CCM Pharmacist for sustained hyperglycemic episodes for possible medication adjustments Discussed appropriate meal portions and how to use food diary with making good food choices Discussed increasing Hgb A1C and ways to help reduce Discussed and encouraged eating bedtime snack nightly to help prevent early morning hypoglycemia Reviewed recent blood sugar values and discussed ways to help reduce ranges   Hypertension: (Status: Condition stable. Not addressed this visit.)  Long Term Goal Last practice recorded BP readings:  BP Readings from Last 3 Encounters:  01/01/21 120/80  12/11/20 (!) 181/75  12/02/20 (!) 150/88  Most recent eGFR/CrCl: No results found for: EGFR  No components found for: CRCL  Evaluation of current treatment plan related to hypertension self management and patient's adherence to plan as established by provider;   Provided education to patient re: stroke prevention, s/s of heart attack and stroke; Reviewed medications with patient and discussed importance of compliance;  Discussed plans with patient for ongoing care management follow up and provided patient with direct contact information for care management team; Advised patient, providing education and rationale, to monitor blood pressure daily and record, calling PCP for findings outside established parameters;  Provided education on prescribed diet low salt carbohydrate modified medium;    Patient Goals/Self-Care Activities Over the next 30 days, patient will:  Check blood sugar at prescribed times (at least 4 times a day)  Check blood sugar if I feel it is too high or too low,  calling provider for sustained highs or lows Enter blood sugar readings and medication/insulin into daily log taking to provider for review Continue to keep food diary notating what foods elevate blood sugars Try to increase fruits and vegetables in diet (eat them daily) Take medication as prescribed Take blood pressure at least weekly and notify provider for elevations Call CCM Pharm Kelly [name] for elevated blood sugars to discuss adjustment of medication Follow Up Plan: The care management team will reach out to the patient again over the next 30 business days.      Care Plan : Hypertension (Adult)  Updates made by Kelly Singleton, RN since 04/11/2021 12:00 AM  Completed 04/11/2021   Problem: Hypertension (Hypertension) Resolved 04/11/2021  Priority: Medium  Note:   RESOLVING DUE TO DUPLICATE GOALS    Long-Range Goal: Hypertension Monitored Completed 04/11/2021  Start Date: 06/16/2020  Expected End Date: 07/06/2021  Recent Progress: On track  Priority: Medium  Note:   Current Barriers:  Knowledge Deficits related to basic understanding  of hypertension pathophysiology and self care management; has been monitoring blood pressures at home she says about once a week at least.  Reports pressures have been 130/70-85. States she did receive spinal injection and her back pain a little better.  "Able to walk without walker now".  Denies any light headiness or dizziness Nurse Case Manager Clinical Goal(s):  Over the next 90 days, patient will verbalize understanding of plan for hypertension management Over the next 90 days, patient will attend all scheduled medical appointments Over the next 90 days, patient will demonstrate improved adherence to prescribed treatment plan for hypertension as evidenced by taking all medications as prescribed, monitoring and recording blood pressure as directed, adhering to low sodium/DASH diet Interventions:  Collaboration with Kelly Haven, MD regarding  development and update of comprehensive plan of care as evidenced by provider attestation and co-signature Inter-disciplinary care team collaboration (see longitudinal plan of care) Evaluation of current treatment plan related to hypertension self management and patient's adherence to plan as established by provider. Reviewed medications with patient and discussed importance of compliance Discussed plans with patient for ongoing care management follow up and provided patient with direct contact information for care management team Advised patient, providing education and rationale, to monitor blood pressure daily and record, calling PCP for findings outside established parameters.  Reviewed scheduled/upcoming provider appointments including: encouraged to contact PCP if rash does not clear in the next 2 weeks Discussed sleep and sleep pattern and encouraged patient to stay awake most of the day, avoid cell phone/screen time at bedtime, trying to decrease stimuli at bedtime Instructed and encouraged patient to place Calendar near medications or blood pressure cuff for ease of logging pressures when she takes them Encouraged to increase activity as tolerated, fall precautions and preventions reviewed and discussed Reviewed signs and symptoms of hypertension and encouraged patient to continue to self monitor Again discussed and encouraged to use [employer] to log vital signs for provider review Emotional support and empathy provided to patient; congratulated on completing outpatient therapy Patient Goals/Self-Care Activities:  Over the next 30 days, patient will: Check blood pressure at least weekly Write blood pressure results in a log or diary and take to medical appointments Keep log near medications and blood pressure machine to remind yourself to write down vitals in log Continue to monitor self for signs and symptoms of high blood pressure (swelling in extremities, headache,  etc) Increase activity as tolerated  Fall precautions and preventions, consider using walker I help with pain Follow Up Plan: The care management team will reach out to the patient again over the next 30 business days.       Plan:The care management team will reach out to the patient again over the next 30 days.  Kelly Azure RN, MSN RN Care Management Coordinator [place] [phone] [email or web address]

## 2021-04-26 DIAGNOSIS — Z20822 Contact with and (suspected) exposure to covid-19: Secondary | ICD-10-CM | POA: Diagnosis not present

## 2021-04-26 DIAGNOSIS — J018 Other acute sinusitis: Secondary | ICD-10-CM | POA: Diagnosis not present

## 2021-04-26 DIAGNOSIS — N3001 Acute cystitis with hematuria: Secondary | ICD-10-CM | POA: Diagnosis not present

## 2021-04-26 DIAGNOSIS — R300 Dysuria: Secondary | ICD-10-CM | POA: Diagnosis not present

## 2021-04-30 ENCOUNTER — Telehealth: Payer: Self-pay | Admitting: *Deleted

## 2021-04-30 ENCOUNTER — Telehealth: Payer: Medicare Other

## 2021-04-30 NOTE — Telephone Encounter (Signed)
°  Care Management   Follow Up Note   04/30/2021 Name: Kelly [name] MRN: [id number] DOB: March 16, 1941   Referred by: [name], MD Reason for referral : Chronic Care Management (HTN, CHF)   An unsuccessful telephone outreach was attempted today. The patient was referred to the case management team for assistance with care management and care coordination.   Follow Up Plan: RNCM will seek assistance from Care Guides in rescheduling appointment within the next 30 days.  Kelly Azure RN, MSN RN Care Management Coordinator [facility]RNCM will seek assistance from Care Guides in rescheduling appointment within the next 30 days.Kelly [name] [phone] [name]

## 2021-05-04 ENCOUNTER — Telehealth: Payer: Self-pay

## 2021-05-04 NOTE — Telephone Encounter (Signed)
Pt declines rescheduling f/u will continue to follow with Catie

## 2021-05-04 NOTE — Chronic Care Management (AMB) (Signed)
°  Care Management   Note  05/04/2021 Name: [name] MRN: [id number] DOB: 16-Jan-1941  Kelly [name] is a 80 y.o. year old female who is a primary care patient of [name], MD and is actively engaged with the care management team. I reached out to [name] by phone today to assist with re-scheduling a follow up visit with the RN Case Manager  Follow up plan: Patient declines further follow up and engagement by the RN CM. Appropriate care team members and provider have been notified via electronic communication.   Kelly [name], [place], [place], [place] [address] Direct Dial: [phone] [email or web address]

## 2021-05-08 DIAGNOSIS — E1122 Type 2 diabetes mellitus with diabetic chronic kidney disease: Secondary | ICD-10-CM | POA: Diagnosis not present

## 2021-05-08 DIAGNOSIS — N184 Chronic kidney disease, stage 4 (severe): Secondary | ICD-10-CM | POA: Diagnosis not present

## 2021-05-08 DIAGNOSIS — Z794 Long term (current) use of insulin: Secondary | ICD-10-CM

## 2021-05-08 DIAGNOSIS — I10 Essential (primary) hypertension: Secondary | ICD-10-CM | POA: Diagnosis not present

## 2021-05-17 ENCOUNTER — Ambulatory Visit (INDEPENDENT_AMBULATORY_CARE_PROVIDER_SITE_OTHER): Payer: Medicare Other | Admitting: Pharmacist

## 2021-05-17 ENCOUNTER — Ambulatory Visit (INDEPENDENT_AMBULATORY_CARE_PROVIDER_SITE_OTHER): Payer: Medicare Other

## 2021-05-17 ENCOUNTER — Other Ambulatory Visit: Payer: Self-pay

## 2021-05-17 DIAGNOSIS — N184 Chronic kidney disease, stage 4 (severe): Secondary | ICD-10-CM

## 2021-05-17 DIAGNOSIS — I10 Essential (primary) hypertension: Secondary | ICD-10-CM

## 2021-05-17 DIAGNOSIS — E1122 Type 2 diabetes mellitus with diabetic chronic kidney disease: Secondary | ICD-10-CM

## 2021-05-17 DIAGNOSIS — Z794 Long term (current) use of insulin: Secondary | ICD-10-CM

## 2021-05-17 DIAGNOSIS — E785 Hyperlipidemia, unspecified: Secondary | ICD-10-CM

## 2021-05-17 DIAGNOSIS — Z23 Encounter for immunization: Secondary | ICD-10-CM | POA: Diagnosis not present

## 2021-05-17 MED ORDER — DAPAGLIFLOZIN PROPANEDIOL 10 MG PO TABS: 10.0000 mg | ORAL_TABLET | Freq: Every day | ORAL | 1 refills | Status: DC

## 2021-05-17 NOTE — Patient Instructions (Addendum)
Kelly [name],   It was great talking with you today!  Increase Farxiga to 10 mg daily. You can take 2 of the 10 mg tablets every day until you complete your current supply.   Work on decreasing portion sizes of high carbohydrate medications - potatoes, pasta, rice, breads. Focus more on choosing lean proteins (chicken, fish), vegetables, and increasing fiber intake.   Focus on staying well hydrated.   Take care!  [name], PharmD  Visit Information  Following are the goals we discussed today:    Patient Goals/Self-Care Activities Over the next 90 days, patient will:  - take medications as prescribed - check glucose at least four times daily using continuous glucose monitor, document, and provide at future appointments - target a minimum of 150 minutes of moderate intensity exercise weekly - start keeping a food diary to better monitor impact of dietary choices          Plan: Face to Face appointment with care management team member scheduled for: 4 weeks   [name], PharmD, [place], CPP Clinical Pharmacist [facility] [phone]   Please call the care guide team at [phone] if you need to cancel or reschedule your appointment.   Print copy of patient instructions, educational materials, and care plan provided in person.

## 2021-05-17 NOTE — Progress Notes (Signed)
Patient received High does flu shot in the right arm. Patient tolerated well.

## 2021-05-17 NOTE — Chronic Care Management (AMB) (Signed)
Chronic Care Management CCM Pharmacy Note  05/17/2021 Name:  Kelly [name] MRN:  [id number] DOB:  18-Jun-1940  Summary: - Glucose elevated. Tolerating Farxiga  Recommendations/Changes made from today's visit: - Increase Farxiga to 10 mg daily - Discussed vaccines. Flu shot administered today  Subjective: Kelly [name] is an 81 y.o. year old female who is a primary patient of Sonnenberg, Angela Adam, MD.  The CCM team was consulted for assistance with disease management and care coordination needs.    Engaged with patient face to face for follow up visit for pharmacy case management and/or care coordination services. Her daughter was also present for the visit today  Objective:  Medications Reviewed Today     Reviewed by [name], RPH-CPP (Pharmacist) on 05/17/21 at 1433  Med List Status: <None>   Medication Order Taking? Sig Documenting Provider Last Dose Status Informant  amLODipine (NORVASC) 5 MG tablet [id number] Yes TAKE 1 TABLET DAILY [name], MD Taking Active   aspirin EC 81 MG tablet [id number] Yes Take 81 mg by mouth daily. [provider] Taking Active Multiple Informants  atorvastatin (LIPITOR) 80 MG tablet [id number] Yes Take 1 tablet (80 mg total) by mouth daily. [name], MD Taking Active   BD PEN NEEDLE NANO U/F 32G X 4 MM MISC [id number]  USE DAILY WITH VICTOZA AND [name], MD  Active   bisacodyl (DULCOLAX) 5 MG EC tablet [id number] Yes Take 5 mg by mouth daily as needed for moderate constipation. [provider] Taking Active   calcitRIOL (ROCALTROL) 0.25 MCG capsule [id number] Yes Take 0.25 mcg by mouth daily. [provider] Taking Active   Calcium Citrate-Vitamin D (CALCIUM CITRATE +D PO) [id number] Yes Take 600 mg by mouth daily.  [provider] Taking Active   Cholecalciferol (VITAMIN D) 50 MCG (2000 UT) CAPS [id number] Yes Take 2,000 Units by mouth daily.  [provider] Taking Active   Continuous Blood Gluc Sensor (FREESTYLE LIBRE 2 SENSOR) MISC [id number]  USE TO CHECK SUGAR AT LEAST4 TIMES A DAY [name], MD  Active   cyclobenzaprine (FLEXERIL) 10 MG tablet [id number] Yes TAKE 1/2 TABLET (5MG TOTAL)3 TIMES A DAY AS NEEDED    FOR MUSCLE SPASMS [name], MD Taking Active   dapagliflozin propanediol (FARXIGA) 5 MG TABS tablet [id number] Yes Take 1 tablet (5 mg total) by mouth daily. [name], MD Taking Active   denosumab (PROLIA) 60 MG/ML SOSY injection [id number]  Inject 60 mg into the skin every 6 (six) months. [provider]  Consider Medication Status and Discontinue (Duplicate)            Med Note [name], Arville Lime   Wed May 20, 2020  2:28 PM)    denosumab (PROLIA) injection 60 mg [id number]   [name], MD  Active   DULoxetine HCl 40 MG CPEP [id number] Yes TAKE 1 CAPSULE DAILY [name], MD Taking Active   furosemide (LASIX) 40 MG tablet [id number] Yes Take 40 mg by mouth daily as needed. [provider] Taking Active   gabapentin (NEURONTIN) 100 MG capsule [id number] Yes Take 1 capsule (100 mg total) by mouth 3 (three) times daily. [name], MD Taking Active            Med Note [name] Jan 27, 2021 12:58 PM) 1 capsule QPM  glucosamine-chondroitin 500-400 MG tablet [id number] Yes Take 1 tablet by mouth 3 (  three) times daily. [provider] Taking Active            Med Note [name], [facility] D   Tue Jun 16, 2020 11:13 AM) Reports taking once a day  glucose blood (ACCU-CHEK AVIVA PLUS) test strip [id number]  Use to check blood sugar up to 4 times daily [name], MD  Active   HYDROcodone-acetaminophen (NORCO/VICODIN) 5-325 MG tablet [id number] Yes Take 1 tablet by mouth every 4 (four) hours as needed. [provider] Taking Active            Med Note [name]   Wed Oct 14, 2020  1:11 PM)    Insulin Glargine (BASAGLAR  KWIKPEN) 100 UNIT/ML [id number] Yes Inject 6 Units into the skin daily. [name], MD Taking Active   Lancets MISC [id number]  Use up to 4 times daily to check blood sugars. Coral Spikes, DO  Active   liraglutide (VICTOZA) 18 MG/3ML SOPN [id number] Yes Inject 1.2 mg into the skin daily. [name], MD Taking Active   losartan (COZAAR) 50 MG tablet [id number] Yes TAKE 1 TABLET DAILY [name], MD Taking Active   Melatonin 5 MG CHEW [id number] Yes Chew 5 mg by mouth daily. [provider] Taking Active   potassium chloride (K-DUR) 10 MEQ tablet [id number]  Take 10 mEq by mouth daily as needed. [provider]  Active            Med Note [name], [name] Jan 27, 2021 12:59 PM)    triamcinolone ointment (KENALOG) 0.5 % [id number]  Apply 1 application topically 2 (two) times daily.  Patient not taking: Reported on 01/27/2021   [name], MD  Active             Pertinent Labs:   Lab Results  Component Value Date   HGBA1C 8.5 (A) 01/01/2021   Lab Results  Component Value Date   CHOL 107 04/22/2019   HDL 27.40 (L) 04/22/2019   LDLCALC 46 04/22/2019   LDLDIRECT 78.0 02/18/2019   TRIG 168.0 (H) 04/22/2019   CHOLHDL 4 04/22/2019   Lab Results  Component Value Date   CREATININE 2.09 (H) 12/11/2020   BUN 37 (H) 12/11/2020   NA 137 12/11/2020   K 4.1 12/11/2020   CL 103 12/11/2020   CO2 26 12/11/2020    SDOH:  (Social Determinants of Health) assessments and interventions performed:  SDOH Interventions    Flowsheet Row Most Recent Value  SDOH Interventions   Financial Strain Interventions Intervention Not Indicated       CCM Care Plan  Review of patient past medical history, allergies, medications, health status, including review of consultants reports, laboratory and other test data, was performed as part of comprehensive evaluation and provision of chronic care management services.   Care Plan : Medication Management   Updates made by [name], RPH-CPP since 05/17/2021 12:00 AM     Problem: Diabetes, CKD, HTN, HLD      Long-Range Goal: Disease Progression Prevention   Start Date: 10/14/2020  Recent Progress: On track  Priority: High  Note:   Current Barriers:  Unable to achieve control of diabetes  Unable to independently control mental health diagnoses   Pharmacist Clinical Goal(s):  Over the next 90 days, patient will achieve adherence to monitoring guidelines and medication adherence to achieve therapeutic efficacy. Over the next 90 days, patient will a achieve control of diabetes as evidenced by  A1c through collaboration with PharmD and provider.    Interventions: 1:1 collaboration with [name], MD regarding development and update of comprehensive plan of care as evidenced by provider attestation and co-signature Inter-disciplinary care team collaboration (see longitudinal plan of care) Comprehensive medication review performed; medication list updated in electronic medical record  Health Maintenance Yearly diabetic eye exam: up to date  Yearly diabetic foot exam: due - placed reminder in upcoming PCP appointment notes Urine microalbumin: up to date Yearly influenza vaccination: due - administered today Td/Tdap vaccination: up to date Pneumonia vaccination: due COVID vaccinations: due - recommended to pursue bivalent booster. She declines Shingrix vaccinations: up to date Colonoscopy: up to date Bone density scan: up to date Mammogram: up to date  Diabetes: Uncontrolled; goal <8% given history of hypoglycemia with dose adjustments; current treatment: Basaglar 6 units daily, Victoza 1.2 mg daily, Farxiga 5 mg daily  Hx nausea, vomiting with Ozempic. GI upset with higher dose Victoza.  Would avoid reinitiation of metformin given eGFR fluctuating around 30 Current meal patterns: more carbohydrates lately. Daughter reports new boyfriend that takes patient out to eat  often. Denies sodas recently.  Current glucose readings: using Libre 2 CGM Date of Download: 05/04/21-05/17/21 % Time CGM is active: 191% Average Glucose: 247 mg/dL Glucose Management Indicator: 9.2  Glucose Variability: 33.6 (goal <36%) Time in Goal:  - Time in range 70-180: 24% - Time above range: 76% - Time below range: 0% Observed patterns: significant post prandial elevations  Discussed increasing Farxiga vs cautious titration of Victoza. Patient elects to increase [place]. Increase to 10 mg daily. Discussed importance of remaining well hydrated Discussed reducing carbohydrate portion sizes, choosing vegetables, lean proteins, increasing fibers. Praised for avoiding sodas    Hypertension: Controlled per last clinic BP; current treatment: amlodipine 5 mg daily, losartan 50 mg daily Furosemide 40 mg + potassium 10 mEq PRN lower extremity edema (and "fluid eye bags"); has not used in a very long time Previously recommended to continue current regimen at this time.   Hyperlipidemia, ASCVD risk reduction Controlled per last lipid panel; current treatment: atorvastatin 80 mg daily Antiplatelet regimen: aspirin 81 mg daily Previously recommended continue current regimen.    Depression, anxiety: Improved per patient report; current regimen: duloxetine 40 mg daily Previously recommended to continue current regimen at this time.    Osteoporosis w/ hypocalcemia: Appropriately treated w/ Prolia Q6 months;  Supplements: calcium 1200 mg + Vitamin D 1000 units daily; calcitriol 0.25 mcg daily per nephrology  Previously recommended to continue current regimen at this time   Chronic Pain - Degenerative Disc Disease, Arthritis: Well controlled at this time; current regimen: gabapentin 100 mg BID, but notes that she is only taking 1 dose QPM, PRN hydrocodone/APAP 5/325 mg PRN- no more than once daily per pain management; cyclobenzaprine 10 mg - no more than once weekly; bisacodyl 5 mg  daily Previously recommended to continue current regimen at this time. Continue to monitor for increased sedation w/ age.   Patient Goals/Self-Care Activities Over the next 90 days, patient will:  - take medications as prescribed - check glucose at least four times daily using continuous glucose monitor, document, and provide at future appointments - target a minimum of 150 minutes of moderate intensity exercise weekly - start keeping a food diary to better monitor impact of dietary choices        Plan: Face to Face appointment with care management team member scheduled for: 4 weeks  [name], PharmD, [place], CPP  Contractor at [employer] [phone]

## 2021-05-24 ENCOUNTER — Telehealth: Payer: Self-pay | Admitting: Family Medicine

## 2021-05-24 DIAGNOSIS — I10 Essential (primary) hypertension: Secondary | ICD-10-CM | POA: Diagnosis not present

## 2021-05-24 DIAGNOSIS — N1832 Chronic kidney disease, stage 3b: Secondary | ICD-10-CM | POA: Diagnosis not present

## 2021-05-24 DIAGNOSIS — E1122 Type 2 diabetes mellitus with diabetic chronic kidney disease: Secondary | ICD-10-CM | POA: Diagnosis not present

## 2021-05-24 NOTE — Telephone Encounter (Signed)
Patient's daughter called and wanted to apologize to Catie and would like to ask her one question about patient's medication. Please call her [phone].

## 2021-05-25 NOTE — Telephone Encounter (Signed)
Returned [employer]. She wanted to apologize, as she got short with me at our last appointment due to being stressed about her mother's health.   She is also worried about her mother being dehydrated. We discussed importance of remaining well hydrated, but being on the look out for signs and symptoms of DKA - hyperglycemia, nausea, vomiting, diarrhea, and if these occurred to seek emergency medical care. Marcie Bal noted she felt better after discussing what to look out for.

## 2021-05-27 DIAGNOSIS — N1832 Chronic kidney disease, stage 3b: Secondary | ICD-10-CM | POA: Diagnosis not present

## 2021-05-27 DIAGNOSIS — N2581 Secondary hyperparathyroidism of renal origin: Secondary | ICD-10-CM | POA: Diagnosis not present

## 2021-05-27 DIAGNOSIS — E1122 Type 2 diabetes mellitus with diabetic chronic kidney disease: Secondary | ICD-10-CM | POA: Diagnosis not present

## 2021-05-27 DIAGNOSIS — I10 Essential (primary) hypertension: Secondary | ICD-10-CM | POA: Diagnosis not present

## 2021-05-28 ENCOUNTER — Other Ambulatory Visit: Payer: Self-pay | Admitting: Family Medicine

## 2021-05-31 DIAGNOSIS — M6281 Muscle weakness (generalized): Secondary | ICD-10-CM | POA: Diagnosis not present

## 2021-05-31 DIAGNOSIS — M4312 Spondylolisthesis, cervical region: Secondary | ICD-10-CM | POA: Diagnosis not present

## 2021-05-31 DIAGNOSIS — M79642 Pain in left hand: Secondary | ICD-10-CM | POA: Diagnosis not present

## 2021-05-31 DIAGNOSIS — Q762 Congenital spondylolisthesis: Secondary | ICD-10-CM | POA: Diagnosis not present

## 2021-05-31 DIAGNOSIS — M7062 Trochanteric bursitis, left hip: Secondary | ICD-10-CM | POA: Diagnosis not present

## 2021-05-31 DIAGNOSIS — E1165 Type 2 diabetes mellitus with hyperglycemia: Secondary | ICD-10-CM | POA: Diagnosis not present

## 2021-05-31 DIAGNOSIS — T2101XA Burn of unspecified degree of chest wall, initial encounter: Secondary | ICD-10-CM | POA: Diagnosis not present

## 2021-05-31 DIAGNOSIS — Z79899 Other long term (current) drug therapy: Secondary | ICD-10-CM | POA: Diagnosis not present

## 2021-05-31 DIAGNOSIS — M542 Cervicalgia: Secondary | ICD-10-CM | POA: Diagnosis not present

## 2021-05-31 DIAGNOSIS — M5136 Other intervertebral disc degeneration, lumbar region: Secondary | ICD-10-CM | POA: Diagnosis not present

## 2021-05-31 DIAGNOSIS — M25572 Pain in left ankle and joints of left foot: Secondary | ICD-10-CM | POA: Diagnosis not present

## 2021-05-31 DIAGNOSIS — M25519 Pain in unspecified shoulder: Secondary | ICD-10-CM | POA: Diagnosis not present

## 2021-05-31 DIAGNOSIS — M545 Low back pain, unspecified: Secondary | ICD-10-CM | POA: Diagnosis not present

## 2021-05-31 DIAGNOSIS — M5032 Other cervical disc degeneration, mid-cervical region, unspecified level: Secondary | ICD-10-CM | POA: Diagnosis not present

## 2021-05-31 DIAGNOSIS — M79641 Pain in right hand: Secondary | ICD-10-CM | POA: Diagnosis not present

## 2021-05-31 DIAGNOSIS — M069 Rheumatoid arthritis, unspecified: Secondary | ICD-10-CM | POA: Diagnosis not present

## 2021-05-31 DIAGNOSIS — M47812 Spondylosis without myelopathy or radiculopathy, cervical region: Secondary | ICD-10-CM | POA: Diagnosis not present

## 2021-05-31 DIAGNOSIS — M419 Scoliosis, unspecified: Secondary | ICD-10-CM | POA: Diagnosis not present

## 2021-06-03 DIAGNOSIS — Z20822 Contact with and (suspected) exposure to covid-19: Secondary | ICD-10-CM | POA: Diagnosis not present

## 2021-06-08 ENCOUNTER — Telehealth: Payer: Self-pay | Admitting: Family Medicine

## 2021-06-08 DIAGNOSIS — N184 Chronic kidney disease, stage 4 (severe): Secondary | ICD-10-CM | POA: Diagnosis not present

## 2021-06-08 DIAGNOSIS — Z794 Long term (current) use of insulin: Secondary | ICD-10-CM

## 2021-06-08 DIAGNOSIS — E1122 Type 2 diabetes mellitus with diabetic chronic kidney disease: Secondary | ICD-10-CM

## 2021-06-08 DIAGNOSIS — E785 Hyperlipidemia, unspecified: Secondary | ICD-10-CM

## 2021-06-08 DIAGNOSIS — I10 Essential (primary) hypertension: Secondary | ICD-10-CM | POA: Diagnosis not present

## 2021-06-08 MED ORDER — VICTOZA 18 MG/3ML ~~LOC~~ SOPN: 1.2000 mg | PEN_INJECTOR | Freq: Every day | SUBCUTANEOUS | 3 refills | Status: DC

## 2021-06-08 NOTE — Telephone Encounter (Signed)
Patient daughter called in to speak with Catie, Patient has no more liraglutide Victoza 18 MG and needing  samples or call something in, patient is  completely out . Daughter would like call @ [phone] or [phone]

## 2021-06-08 NOTE — Telephone Encounter (Signed)
Called patient's daughter. Patient forgot to reorder her Victoza from mail order, so she is completely out. When her daughter got over around ~2:30 pm, she had not taken insulin or Victoza yet. She reports sugar is >300 after her mom ate a tangerine.   They asked if we had samples. Noted that we do not have Victoza samples. Sent refill to local pharmacy, [facility], to be filled today. If [place] cannot fill today because mail order has already processed refill, then we will discuss temporarily increasing insulin dose to account for missing Victoza.

## 2021-06-10 ENCOUNTER — Other Ambulatory Visit: Payer: Self-pay

## 2021-06-10 ENCOUNTER — Ambulatory Visit (INDEPENDENT_AMBULATORY_CARE_PROVIDER_SITE_OTHER): Payer: Medicare Other | Admitting: Pharmacist

## 2021-06-10 VITALS — BP 135/80 | HR 75

## 2021-06-10 DIAGNOSIS — R112 Nausea with vomiting, unspecified: Secondary | ICD-10-CM | POA: Diagnosis not present

## 2021-06-10 DIAGNOSIS — E1122 Type 2 diabetes mellitus with diabetic chronic kidney disease: Secondary | ICD-10-CM

## 2021-06-10 DIAGNOSIS — Z794 Long term (current) use of insulin: Secondary | ICD-10-CM

## 2021-06-10 MED ORDER — ONDANSETRON HCL 4 MG PO TABS: 4.0000 mg | ORAL_TABLET | Freq: Three times a day (TID) | ORAL | 0 refills | Status: DC | PRN

## 2021-06-10 MED ORDER — BASAGLAR KWIKPEN 100 UNIT/ML ~~LOC~~ SOPN: 7.0000 [IU] | PEN_INJECTOR | Freq: Every day | SUBCUTANEOUS | 0 refills | Status: DC

## 2021-06-10 NOTE — Chronic Care Management (AMB) (Signed)
Chronic Care Management CCM Pharmacy Note  06/10/2021 Name:  Kelly [name] MRN:  [id number] DOB:  02-Aug-1940  Summary: - Acute concerns today related to nausea/vomiting.   Recommendations/Changes made from today's visit: - Collaborated with McLean-Scocuzza. Acute visit scheduled today.   Subjective: Kelly [name] is an 81 y.o. year old female who is a primary patient of Caryl Bis, Angela Adam, MD.  The CCM team was consulted for assistance with disease management and care coordination needs.    Engaged with patient face to face for follow up visit for pharmacy case management and/or care coordination services. Her daughter, Marcie Bal, was also present for the visit.   Objective:  Medications Reviewed Today     Reviewed by [name], RPH-CPP (Pharmacist) on 06/10/21 at 1447  Med List Status: <None>   Medication Order Taking? Sig Documenting Provider Last Dose Status Informant  amLODipine (NORVASC) 5 MG tablet [id number]  TAKE 1 TABLET DAILY [name], MD  Active   aspirin EC 81 MG tablet [id number]  Take 81 mg by mouth daily. [provider]  Active Multiple Informants  atorvastatin (LIPITOR) 80 MG tablet [id number]  Take 1 tablet (80 mg total) by mouth daily. [name], MD  Active   BD PEN NEEDLE NANO U/F 32G X 4 MM MISC [id number]  USE DAILY WITH VICTOZA AND [name], MD  Active   bisacodyl (DULCOLAX) 5 MG EC tablet [id number]  Take 5 mg by mouth daily as needed for moderate constipation. [provider]  Active   calcitRIOL (ROCALTROL) 0.25 MCG capsule [id number]  Take 0.25 mcg by mouth daily. [provider]  Active   Calcium Citrate-Vitamin D (CALCIUM CITRATE +D PO) [id number]  Take 600 mg by mouth daily.  [provider]  Active   Cholecalciferol (VITAMIN D) 50 MCG (2000 UT) CAPS [id number]  Take 2,000 Units by mouth daily.  [provider]  Active   Continuous Blood Gluc Sensor (FREESTYLE  LIBRE 2 SENSOR) MISC [id number]  USE TO CHECK SUGAR AT LEAST4 TIMES A DAY [name], MD  Active   cyclobenzaprine (FLEXERIL) 10 MG tablet [id number]  TAKE 1/2 TABLET (5MG TOTAL)3 TIMES A DAY AS NEEDED    FOR MUSCLE SPASMS [name], MD  Active   dapagliflozin propanediol (FARXIGA) 10 MG TABS tablet [id number]  Take 1 tablet (10 mg total) by mouth daily. McLean-Scocuzza, Nino Glow, MD  Active   denosumab (PROLIA) 60 MG/ML SOSY injection [id number]  Inject 60 mg into the skin every 6 (six) months. [provider]  Active            Med Note Kelly [name] May 20, 2020  2:28 PM)    denosumab [facility]) injection 60 mg [id number]   [name], MD  Active   DULoxetine HCl 40 MG CPEP [id number]  TAKE 1 CAPSULE DAILY [name], MD  Active   furosemide (LASIX) 40 MG tablet [id number]  Take 40 mg by mouth daily as needed. [provider]  Active   gabapentin (NEURONTIN) 100 MG capsule [id number]  Take 1 capsule (100 mg total) by mouth 3 (three) times daily. [name], MD  Active            Med Note Kelly [name] Jan 27, 2021 12:58 PM) 1 capsule QPM  glucosamine-chondroitin 500-400 MG tablet [id number]  Take 1 tablet by mouth 3 (three) times daily. [provider]  Active            Med Note [name]   Tue Jun 16, 2020 11:13 AM) Reports taking once a day  glucose blood (ACCU-CHEK AVIVA PLUS) test strip [id number]  Use to check blood sugar up to 4 times daily [name], MD  Active   HYDROcodone-acetaminophen (NORCO/VICODIN) 5-325 MG tablet [id number]  Take 1 tablet by mouth every 4 (four) hours as needed. [provider]  Active            Med Note [name]   Wed Oct 14, 2020  1:11 PM)    Insulin Glargine (BASAGLAR KWIKPEN) 100 UNIT/ML [id number]  Inject 7 Units into the skin daily. [name], MD  Active   Lancets MISC [id number]  Use up to 4 times daily to check blood sugars. Coral Spikes, DO  Active   liraglutide (VICTOZA) 18 MG/3ML SOPN [id number] Yes Inject 1.2 mg into the skin daily. [name], MD Taking Active   losartan (COZAAR) 50 MG tablet [id number]  TAKE 1 TABLET DAILY [name], MD  Active   Melatonin 5 MG CHEW [id number]  Chew 5 mg by mouth daily. [provider]  Active   ondansetron (ZOFRAN) 4 MG tablet [id number] Yes Take 1 tablet (4 mg total) by mouth every 8 (eight) hours as needed for nausea or vomiting. McLean-Scocuzza, Nino Glow, MD  Active   potassium chloride (K-DUR) 10 MEQ tablet [id number]  Take 10 mEq by mouth daily as needed. [provider]  Active            Med Note [name], [name] Jan 27, 2021 12:59 PM)    triamcinolone ointment (KENALOG) 0.5 % [id number]  Apply 1 application topically 2 (two) times daily.  Patient not taking: Reported on 01/27/2021   [name], MD  Active             Pertinent Labs:   Lab Results  Component Value Date   HGBA1C 8.5 (A) 01/01/2021   Lab Results  Component Value Date   CHOL 107 04/22/2019   HDL 27.40 (L) 04/22/2019   LDLCALC 46 04/22/2019   LDLDIRECT 78.0 02/18/2019   TRIG 168.0 (H) 04/22/2019   CHOLHDL 4 04/22/2019   Lab Results  Component Value Date   CREATININE 2.09 (H) 12/11/2020   BUN 37 (H) 12/11/2020   NA 137 12/11/2020   K 4.1 12/11/2020   CL 103 12/11/2020   CO2 26 12/11/2020    SDOH:  (Social Determinants of Health) assessments and interventions performed:  SDOH Interventions    Flowsheet Row Most Recent Value  SDOH Interventions   Financial Strain Interventions Intervention Not Indicated       CCM Care Plan  Review of patient past medical history, allergies, medications, health status, including review of consultants reports, laboratory and other test data, was performed as part of comprehensive evaluation and provision of chronic care management services.   Care Plan : Medication Management  Updates made by [name], RPH-CPP since 06/10/2021 12:00 AM     Problem: Diabetes, CKD, HTN, HLD      Long-Range Goal: Disease Progression Prevention   Start Date: 10/14/2020  Recent Progress: On track  Priority: High  Note:   Current Barriers:  Unable to achieve control of diabetes  Unable to independently control mental health diagnoses   Pharmacist Clinical Goal(s):  Over the next 90 days, patient will achieve  adherence to monitoring guidelines and medication adherence to achieve therapeutic efficacy. Over the next 90 days, patient will a achieve control of diabetes as evidenced by A1c through collaboration with PharmD and provider.    Interventions: 1:1 collaboration with [name], MD regarding development and update of comprehensive plan of care as evidenced by provider attestation and co-signature Inter-disciplinary care team collaboration (see longitudinal plan of care) Comprehensive medication review performed; medication list updated in electronic medical record  Health Maintenance Yearly diabetic eye exam: up to date  Yearly diabetic foot exam: due - placed reminder in upcoming PCP appointment notes Urine microalbumin: up to date Yearly influenza vaccination: due - administered today Td/Tdap vaccination: up to date Pneumonia vaccination: due COVID vaccinations: due - recommended to pursue bivalent booster. She declines Shingrix vaccinations: up to date Colonoscopy: up to date Bone density scan: up to date Mammogram: up to date  Acute Concerns: - Daughter and patient report periodic episodes of vomiting for the past 3 weeks. Cannot pinpoint any patterns related to food intake, medications, particular times of the day. They wonder if it's related to hyponatremia after reading about duloxetine on the internet. They also worry it could be related to renal disease. The only recent medication change has been increasing Farxiga at our last CCM visit 3 weeks ago.  - Daughter  separately reports concerns about patient's cognition. More episodes of patient forgetting conversations, forgetting to take medications. Appears Marcie Bal calls on a daily basis to remind patient to take her medication, including insulin, but sometimes still forgets until later in the day. They have started a new routine of writing down what time patient takes her [place] and this has seemed to help with regularity.  - Discussed acute concerns with doctor of the day. Dr. Terese Door. Scheduled acute visit with Dr. Terese Door tomorrow morning. She asked to order CBC, CMET, UA, lipase. Ordered.   Diabetes: Uncontrolled; goal <8% given history of hypoglycemia with dose adjustments; current treatment: Basaglar 6 units daily, Victoza 1.2 mg daily, Farxiga 10 mg daily  Hx nausea, vomiting with Ozempic. GI upset with higher dose Victoza.  Would avoid reinitiation of metformin given eGFR fluctuating around 30 Current glucose readings: using Libre 2 CGM Date of Download: 05/28/21-06/10/21 % Time CGM is active: 91% Average Glucose: 198 mg/dL Glucose Management Indicator: 8.0% Glucose Variability: 34.6 (goal <36%) Time in Goal:  - Time in range 70-180: 44% - Time above range: 56% - Time below range: 0% Observed patterns: significant post prandial elevations Discussed with Dr. Terese Door. Increase Basaglar to 7 units daily. Patient likely needs prandial coverage. Consider dose decrease or elimination of Victoza with initiation of sliding scale mealtime dosing of prandial insulin. Patient's adherence would be the concern for more complex insulin therapy. Will follow work up by Dr. Terese Door and upcoming appointment with Dr. Caryl Bis.    Hypertension: Controlled per last clinic BP; current treatment: amlodipine 5 mg daily, losartan 50 mg daily Furosemide 40 mg + potassium 10 mEq PRN lower extremity edema (and "fluid eye bags"); has not used in a very long time Previously recommended to  continue current regimen at this time.   Hyperlipidemia, ASCVD risk reduction Controlled per last lipid panel; current treatment: atorvastatin 80 mg daily Antiplatelet regimen: aspirin 81 mg daily Previously recommended continue current regimen.    Depression, anxiety: Improved per patient report; current regimen: duloxetine 40 mg daily Previously recommended to continue current regimen at this time.    Osteoporosis w/ hypocalcemia: Appropriately treated w/ Prolia Q6  months;  Supplements: calcium 1200 mg + Vitamin D 1000 units daily; calcitriol 0.25 mcg daily per nephrology  Previously recommended to continue current regimen at this time   Chronic Pain - Degenerative Disc Disease, Arthritis: Well controlled at this time; current regimen: gabapentin 100 mg BID, but notes that she is only taking 1 dose QPM, PRN hydrocodone/APAP 5/325 mg PRN- no more than once daily per pain management; cyclobenzaprine 10 mg - no more than once weekly; bisacodyl 5 mg daily Previously recommended to continue current regimen at this time. Continue to monitor for increased sedation w/ age.   Patient Goals/Self-Care Activities Over the next 90 days, patient will:  - take medications as prescribed - check glucose at least four times daily using continuous glucose monitor, document, and provide at future appointments - target a minimum of 150 minutes of moderate intensity exercise weekly - start keeping a food diary to better monitor impact of dietary choices        Plan: follow up pending acute work up  [employer], PharmD, [address], [place] at [employer] [phone]

## 2021-06-10 NOTE — Patient Instructions (Signed)
Visit Information  Following are the goals we discussed today:  Patient Goals/Self-Care Activities Over the next 90 days, patient will:  - take medications as prescribed - check glucose at least four times daily using continuous glucose monitor, document, and provide at future appointments - target a minimum of 150 minutes of moderate intensity exercise weekly - start keeping a food diary to better monitor impact of dietary choices          Plan: follow up pending acute work up   [employer], PharmD, [place], [place] at [facility] [phone]     Please call the care guide team at [phone] if you need to cancel or reschedule your appointment.   The patient verbalized understanding of instructions, educational materials, and care plan provided today and declined offer to receive copy of patient instructions, educational materials, and care plan.

## 2021-06-11 ENCOUNTER — Telehealth: Payer: Self-pay | Admitting: Family Medicine

## 2021-06-11 ENCOUNTER — Encounter: Payer: Self-pay | Admitting: Internal Medicine

## 2021-06-11 ENCOUNTER — Other Ambulatory Visit: Payer: Self-pay | Admitting: Internal Medicine

## 2021-06-11 ENCOUNTER — Ambulatory Visit (INDEPENDENT_AMBULATORY_CARE_PROVIDER_SITE_OTHER): Payer: Medicare Other | Admitting: Internal Medicine

## 2021-06-11 ENCOUNTER — Other Ambulatory Visit: Payer: Medicare Other

## 2021-06-11 VITALS — BP 122/80 | Temp 97.7°F | Ht 64.0 in | Wt 119.6 lb

## 2021-06-11 DIAGNOSIS — R1013 Epigastric pain: Secondary | ICD-10-CM

## 2021-06-11 DIAGNOSIS — R748 Abnormal levels of other serum enzymes: Secondary | ICD-10-CM | POA: Diagnosis not present

## 2021-06-11 DIAGNOSIS — B3731 Acute candidiasis of vulva and vagina: Secondary | ICD-10-CM

## 2021-06-11 DIAGNOSIS — Z794 Long term (current) use of insulin: Secondary | ICD-10-CM | POA: Diagnosis not present

## 2021-06-11 DIAGNOSIS — R112 Nausea with vomiting, unspecified: Secondary | ICD-10-CM | POA: Diagnosis not present

## 2021-06-11 DIAGNOSIS — F339 Major depressive disorder, recurrent, unspecified: Secondary | ICD-10-CM

## 2021-06-11 DIAGNOSIS — N184 Chronic kidney disease, stage 4 (severe): Secondary | ICD-10-CM | POA: Diagnosis not present

## 2021-06-11 DIAGNOSIS — R8271 Bacteriuria: Secondary | ICD-10-CM | POA: Diagnosis not present

## 2021-06-11 DIAGNOSIS — E1122 Type 2 diabetes mellitus with diabetic chronic kidney disease: Secondary | ICD-10-CM

## 2021-06-11 DIAGNOSIS — F419 Anxiety disorder, unspecified: Secondary | ICD-10-CM | POA: Diagnosis not present

## 2021-06-11 LAB — COMPREHENSIVE METABOLIC PANEL
ALT: 26 U/L (ref 0–35)
AST: 26 U/L (ref 0–37)
Albumin: 4.1 g/dL (ref 3.5–5.2)
Alkaline Phosphatase: 57 U/L (ref 39–117)
BUN: 22 mg/dL (ref 6–23)
CO2: 34 mEq/L — ABNORMAL HIGH (ref 19–32)
Calcium: 9.3 mg/dL (ref 8.4–10.5)
Chloride: 103 mEq/L (ref 96–112)
Creatinine, Ser: 1.76 mg/dL — ABNORMAL HIGH (ref 0.40–1.20)
GFR: 27.09 mL/min — ABNORMAL LOW (ref >60.00)
Glucose, Bld: 146 mg/dL — ABNORMAL HIGH (ref 70–99)
Potassium: 4 mEq/L (ref 3.5–5.1)
Sodium: 142 mEq/L (ref 135–145)
Total Bilirubin: 1.1 mg/dL (ref 0.2–1.2)
Total Protein: 6.3 g/dL (ref 6.0–8.3)

## 2021-06-11 LAB — CBC WITH DIFFERENTIAL/PLATELET
Basophils Absolute: 0.1 10*3/uL (ref 0.0–0.1)
Basophils Relative: 1 % (ref 0.0–3.0)
Eosinophils Absolute: 0.1 10*3/uL (ref 0.0–0.7)
Eosinophils Relative: 1.1 % (ref 0.0–5.0)
HCT: 45.3 % (ref 36.0–46.0)
Hemoglobin: 14.8 g/dL (ref 12.0–15.0)
Lymphocytes Relative: 18.5 % (ref 12.0–46.0)
Lymphs Abs: 1.1 10*3/uL (ref 0.7–4.0)
MCHC: 32.6 g/dL (ref 30.0–36.0)
MCV: 94.8 fl (ref 78.0–100.0)
Monocytes Absolute: 0.4 10*3/uL (ref 0.1–1.0)
Monocytes Relative: 6.3 % (ref 3.0–12.0)
Neutro Abs: 4.4 10*3/uL (ref 1.4–7.7)
Neutrophils Relative %: 73.1 % (ref 43.0–77.0)
Platelets: 137 10*3/uL — ABNORMAL LOW (ref 150.0–400.0)
RBC: 4.78 Mil/uL (ref 3.87–5.11)
RDW: 13.9 % (ref 11.5–15.5)
WBC: 6 10*3/uL (ref 4.0–10.5)

## 2021-06-11 LAB — URINALYSIS, ROUTINE W REFLEX MICROSCOPIC
Bilirubin Urine: NEGATIVE
Hgb urine dipstick: NEGATIVE
Leukocytes,Ua: NEGATIVE
Nitrite: NEGATIVE
RBC / HPF: NONE SEEN (ref 0–?)
Specific Gravity, Urine: 1.02 (ref 1.000–1.030)
Total Protein, Urine: 30 — AB
Urine Glucose: >=1000 — AB
Urobilinogen, UA: 0.2 (ref 0.0–1.0)
pH: 5.5 (ref 5.0–8.0)

## 2021-06-11 LAB — POCT GLYCOSYLATED HEMOGLOBIN (HGB A1C): Hemoglobin A1C: 9.3 % — AB (ref 4.0–5.6)

## 2021-06-11 LAB — LIPASE: Lipase: 70 U/L — ABNORMAL HIGH (ref 11.0–59.0)

## 2021-06-11 MED ORDER — SERTRALINE HCL 25 MG PO TABS: 25.0000 mg | ORAL_TABLET | Freq: Every day | ORAL | 3 refills | Status: DC

## 2021-06-11 MED ORDER — FLUCONAZOLE 150 MG PO TABS: 150.0000 mg | ORAL_TABLET | Freq: Once | ORAL | 0 refills | Status: AC

## 2021-06-11 NOTE — Addendum Note (Signed)
Addended by: Orland Mustard on: 06/11/2021 04:11 PM   Modules accepted: Orders

## 2021-06-11 NOTE — Progress Notes (Addendum)
Encounter details: CCM Time Spent       Value Time User   Time spent with patient (minutes)  48 06/10/2021  2:48 PM [name], RPH-CPP   Time spent performing Chart review  1 06/10/2021  2:48 PM [name], RPH-CPP   Total time (minutes)  49 06/10/2021  2:48 PM [name], RPH-CPP      Moderate to High Complex Decision Making     None      CCM Services: n/a  Prior to outreach and patient consent for Chronic Care Management, I referred this patient for services after reviewing the nominated patient list or from a personal encounter with the patient.  I have personally reviewed this encounter including the documentation in this note and have collaborated with the care management provider regarding care management and care coordination activities to include development and update of the comprehensive care plan. I am certifying that I agree with the content of this note and encounter as supervising physician.   Agree Dr. Olivia Mackie McLean-Scocuzza

## 2021-06-11 NOTE — Progress Notes (Addendum)
Chief Complaint  Patient presents with   Nausea   Emesis   F/u with daughter  1. Been on cymbalta 40 mg qd since 02/2021 prior was on 30 mg but mood/anxiety worse had n/v x 3  (1x per week)  and she and daughter c/w side effect reviewed 18-25% risk side effect nausea and they are c/w pt having upper and lower lip tremors and more sleepy, memory issues and on drugs.com reviewed with CKD 4 use cautoin  Lexapro 10 mg was not working in the past and was on paxil cr for years but changed due to not working then on zoloft 50 mg most recently but her daughter had concern in general about her being on meds  2. Dm 2 with ckd 4 cbg this am 189 and readings at home at times in the 300s with eggs and toast on victoza and basaglar 6 units increased 06/10/21 to 7 units   Review of Systems  Constitutional:  Negative for weight loss.  HENT:  Negative for hearing loss.   Eyes:  Negative for blurred vision.  Respiratory:  Negative for shortness of breath.   Cardiovascular:  Negative for chest pain.  Gastrointestinal:  Positive for nausea and vomiting. Negative for abdominal pain and blood in stool.  Genitourinary:  Negative for dysuria.  Musculoskeletal:  Negative for falls and joint pain.  Skin:  Negative for rash.  Neurological:  Negative for headaches.  Psychiatric/Behavioral:  Negative for depression.   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    Past Surgical History:  Procedure Laterality Date   APPENDECTOMY     BREAST BIOPSY Right 06/19/2018   affirm stereo/x clip/COLUMNAR CELL CHANGE WITH MICROCALCIFICATIONS. FIBROCYSTIC CHANGES WITH MICROCALCIFICATIONS   cataract  03/2017   CHOLECYSTECTOMY     COLONOSCOPY     COLONOSCOPY WITH PROPOFOL N/A  08/22/2017   Procedure: COLONOSCOPY WITH PROPOFOL;  Surgeon: Jonathon Bellows, MD;  Location: [facility] ENDOSCOPY;  Service: Gastroenterology;  Laterality: N/A;   ESOPHAGOGASTRODUODENOSCOPY     ESOPHAGOGASTRODUODENOSCOPY (EGD) WITH PROPOFOL N/A 03/23/2016   Procedure: ESOPHAGOGASTRODUODENOSCOPY (EGD) WITH PROPOFOL;  Surgeon: Manya Silvas, MD;  Location: [facility] ENDOSCOPY;  Service: Endoscopy;  Laterality: N/A;   FLEXIBLE SIGMOIDOSCOPY N/A 02/06/2018   Procedure: FLEXIBLE SIGMOIDOSCOPY;  Surgeon: Jonathon Bellows, MD;  Location: [facility] ENDOSCOPY;  Service: Gastroenterology;  Laterality: N/A;   FLEXIBLE SIGMOIDOSCOPY N/A 12/03/2018   Procedure: FLEXIBLE SIGMOIDOSCOPY;  Surgeon: Jonathon Bellows, MD;  Location: [facility] ENDOSCOPY;  Service: Gastroenterology;  Laterality: N/A;   RECTAL PROLAPSE REPAIR  2012   x2   Family History  Problem Relation Age of Onset   Stroke Mother    Arthritis Mother    Aortic aneurysm Sister    Lung cancer Sister    Heart attack Father 26   Breast cancer Sister 34   Social History   Socioeconomic History   Marital status: Widowed    Spouse name: Not on file   Number of children: Not on file   Years of education: Not on file   Highest education level: High school graduate  Occupational History   Occupation: retired   Tobacco Use   Smoking status: Never   Smokeless tobacco:  Never  Vaping Use   Vaping Use: Never used  Substance and Sexual Activity   Alcohol use: No   Drug use: No   Sexual activity: Not Currently    Partners: Male  Other Topics Concern   Not on file  Social History Narrative   Not on file   Social Determinants of Health   Financial Resource Strain: Low Risk    Difficulty of Paying Living Expenses: Not hard at all  Food Insecurity: No Food Insecurity   Worried About Charity fundraiser in the Last Year: Never true   [place] in the Last Year: Never true  Transportation Needs: No Transportation Needs   Lack of Transportation (Medical): No   Lack  of Transportation (Non-Medical): No  Physical Activity: Insufficiently Active   Days of Exercise per Week: 3 days   Minutes of Exercise per Session: 20 min  Stress: No Stress Concern Present   Feeling of Stress : Only a little  Social Connections: Unknown   Frequency of Communication with Friends and Family: More than three times a week   Frequency of Social Gatherings with Friends and Family: Not on file   Attends Religious Services: Not on Electrical engineer or Organizations: Not on file   Attends Archivist Meetings: Not on file   Marital Status: Not on file  Intimate Partner Violence: Not At Risk   Fear of Current or Ex-Partner: No   Emotionally Abused: No   Physically Abused: No   Sexually Abused: No   Current Meds  Medication Sig   amLODipine (NORVASC) 5 MG tablet TAKE 1 TABLET DAILY   aspirin EC 81 MG tablet Take 81 mg by mouth daily.   atorvastatin (LIPITOR) 80 MG tablet Take 1 tablet (80 mg total) by mouth daily.   BD PEN NEEDLE NANO U/F 32G X 4 MM MISC USE DAILY WITH VICTOZA AND BASAGLAR   calcitRIOL (ROCALTROL) 0.25 MCG capsule Take 0.25 mcg by mouth daily.   Cholecalciferol (VITAMIN D) 50 MCG (2000 UT) CAPS Take 2,000 Units by mouth daily.    Continuous Blood Gluc Sensor (FREESTYLE LIBRE 2 SENSOR) MISC USE TO CHECK SUGAR AT LEAST4 TIMES A DAY   cyclobenzaprine (FLEXERIL) 10 MG tablet TAKE 1/2 TABLET (5MG  TOTAL)3 TIMES A DAY AS NEEDED    FOR MUSCLE SPASMS   dapagliflozin propanediol (FARXIGA) 10 MG TABS tablet Take 1 tablet (10 mg total) by mouth daily.   denosumab (PROLIA) 60 MG/ML SOSY injection Inject 60 mg into the skin every 6 (six) months.   furosemide (LASIX) 40 MG tablet Take 40 mg by mouth daily as needed.   gabapentin (NEURONTIN) 100 MG capsule Take 1 capsule (100 mg total) by mouth 3 (three) times daily.   glucosamine-chondroitin 500-400 MG tablet Take 1 tablet by mouth 3 (three) times daily.   glucose blood (ACCU-CHEK AVIVA PLUS) test  strip Use to check blood sugar up to 4 times daily   HYDROcodone-acetaminophen (NORCO/VICODIN) 5-325 MG tablet Take 1 tablet by mouth every 4 (four) hours as needed.   Insulin Glargine (BASAGLAR KWIKPEN) 100 UNIT/ML Inject 7 Units into the skin daily.   Lancets MISC Use up to 4 times daily to check blood sugars.   liraglutide (VICTOZA) 18 MG/3ML SOPN Inject 1.2 mg into the skin daily.   losartan (COZAAR) 50 MG tablet TAKE 1 TABLET DAILY   Melatonin 5 MG CHEW Chew 5 mg by mouth daily.   potassium chloride (K-DUR)  10 MEQ tablet Take 10 mEq by mouth daily as needed.   sertraline (ZOLOFT) 25 MG tablet Take 1 tablet (25 mg total) by mouth daily. If in 2-4 weeks mood/anxiety not controlled can increase to 50 mg daily   [DISCONTINUED] DULoxetine HCl 40 MG CPEP TAKE 1 CAPSULE DAILY   Current Facility-Administered Medications for the 06/11/21 encounter (Office Visit) with McLean-Scocuzza, Nino Glow, MD  Medication   denosumab (PROLIA) injection 60 mg   Allergies  Allergen Reactions   Prilosec Otc [Omeprazole Magnesium] Other (See Comments)    Maybe cause of acute interstitial nephritis   Sucralfate Other (See Comments)    Maybe cause of acute interstitial nephritis   Amoxicillin Diarrhea   Morphine And Related Other (See Comments)    Chest pains Chest pains   Recent Results (from the past 2160 hour(s))  HM DIABETES EYE EXAM     Status: None   Collection Time: 04/05/21 12:00 AM  Result Value Ref Range   HM Diabetic Eye Exam No Retinopathy No Retinopathy   Objective  Body mass index is 20.53 kg/m. Wt Readings from Last 3 Encounters:  06/11/21 119 lb 9.6 oz (54.3 kg)  01/01/21 120 lb 9.6 oz (54.7 kg)  12/11/20 123 lb (55.8 kg)   Temp Readings from Last 3 Encounters:  06/11/21 97.7 F (36.5 C) (Oral)  01/01/21 98.6 F (37 C) (Oral)  12/11/20 98 F (36.7 C) (Oral)   BP Readings from Last 3 Encounters:  06/11/21 122/80  06/10/21 135/80  01/01/21 120/80   Pulse Readings from Last 3  Encounters:  06/10/21 75  01/01/21 75  12/11/20 73    Physical Exam Vitals and nursing note reviewed.  Constitutional:      Appearance: Normal appearance. She is well-developed and well-groomed.  HENT:     Head: Normocephalic and atraumatic.  Eyes:     Conjunctiva/sclera: Conjunctivae normal.     Pupils: Pupils are equal, round, and reactive to light.  Cardiovascular:     Rate and Rhythm: Normal rate and regular rhythm.     Heart sounds: Normal heart sounds. No murmur heard. Pulmonary:     Effort: Pulmonary effort is normal.     Breath sounds: Normal breath sounds.  Abdominal:     General: Abdomen is flat. Bowel sounds are normal.     Tenderness: There is no abdominal tenderness.  Musculoskeletal:        General: No tenderness.  Skin:    General: Skin is warm and dry.  Neurological:     General: No focal deficit present.     Mental Status: She is alert and oriented to person, place, and time. Mental status is at baseline.     Cranial Nerves: Cranial nerves 2-12 are intact.     Gait: Gait is intact.  Psychiatric:        Attention and Perception: Attention and perception normal.        Mood and Affect: Mood and affect normal.        Speech: Speech normal.        Behavior: Behavior normal. Behavior is cooperative.        Thought Content: Thought content normal.        Cognition and Memory: Cognition and memory normal.        Judgment: Judgment normal.    Assessment  Plan  Nausea and vomiting, other side effects see hpi c/w with cymbalta 40 mg use  Prn zofran S/p GB removal likely not this  Elevated  lipase 70 and epigastric pain  Order ct ab/pelvis w/u pancreatitis  Depression, recurrent ([place]) - Plan: sertraline (ZOLOFT) 25 MG tablet Anxiety - Plan: sertraline (ZOLOFT) 25 MG tablet Consider therapy  [facility]  Address: [address] [place], [place] [address] Hours:  [place] Born soon ? 9?AM Phone: [phone]    [facility] [place]  [address]  [place]  [address]  [phone]   [facility] as given the info before for both  [facility] [place]  [place] [phone]   Taper off cymbalta 30 mg daily x 1 week  then next week every other day and stop x 4 days then stop  Wait 1 day  Start zoloft 25 mg daily    Type 2 diabetes mellitus with stage 4 chronic kidney disease, with long-term current use of insulin ([place]) - Plan: POCT glycosylated hemoglobin (Hb A1C)  Increase basalgar 7 units from 6  A1c 9.3  Continue victoza  Has freestyle libre  Provider: Dr. Olivia Mackie McLean-Scocuzza-Internal Medicine

## 2021-06-11 NOTE — Patient Instructions (Addendum)
Consider therapy  [facility]  Address: [address] [place], [place] [address] Hours:  [place] Born soon ? 9?AM Phone: [phone]    [facility]  [address]  [place] [address]  [phone]   [facility] as given the info before for both  [facility] [place]  [place] [phone]   Taper off cymbalta 30 mg daily x 1 week  then next week every other day and stop x 4 days then stop  Wait 1 day  Start zoloft 25 mg daily

## 2021-06-11 NOTE — Telephone Encounter (Signed)
Patient called in stated she had 3 medication at the Pharmacy but only suppose to have two. The  Patient stated the other one for yeast infection and it must be in wrong chart, I Reach out to [facility] and she stated that the third medication is the patient and that they found yeast in patient urine. Patient call drop and Arianna will reached out to patient to inform

## 2021-06-12 LAB — URINE CULTURE
MICRO NUMBER:: 12961059
SPECIMEN QUALITY:: ADEQUATE

## 2021-06-14 ENCOUNTER — Telehealth: Payer: Self-pay | Admitting: Family Medicine

## 2021-06-14 DIAGNOSIS — Z20822 Contact with and (suspected) exposure to covid-19: Secondary | ICD-10-CM | POA: Diagnosis not present

## 2021-06-14 NOTE — Telephone Encounter (Signed)
Lft pt vm to call ofc to sch CT. thanks 

## 2021-06-15 NOTE — Telephone Encounter (Signed)
Kelly [name], CMA  06/11/2021  4:07 PM EST Back to Top    Patient's daughter informed and verbalized understanding.    Patient does not drink alcohol. They are agreeable to CT Scan.    Will pick up yeast medication. Awaiting culture    Kelly [name], CMA  06/11/2021  4:00 PM EST     Left message to return call.   Medication sent in for Patient to pharmacy for yeast. Patient was at pharmacy and did not recognize medication. She does need to take this medication.    Kelly Glow McLean-Scocuzza, MD  06/11/2021 12:24 PM EST     A1c increased to 9.3 ok with 7 units insulin  GFR stable chronic kidney disease stage 4    Lipase is elevated  -is she drinking alcohol?  -is she agreeable to CT scan abdomen pelvis to further work up and look at pancreas?    Platelets slightly low  -f/u with PCP can repeat in the future    Urine with reate bacterial and crystals and yeast    -sent diflucan    -can we add on urine culture?

## 2021-06-28 ENCOUNTER — Other Ambulatory Visit: Payer: Self-pay

## 2021-06-28 ENCOUNTER — Ambulatory Visit
Admission: RE | Admit: 2021-06-28 | Discharge: 2021-06-28 | Disposition: A | Discharge status: Home / Self Care | Payer: Medicare Other | Source: Ambulatory Visit | Attending: Internal Medicine | Admitting: Internal Medicine

## 2021-06-28 DIAGNOSIS — R748 Abnormal levels of other serum enzymes: Secondary | ICD-10-CM | POA: Insufficient documentation

## 2021-06-28 DIAGNOSIS — I700 Atherosclerosis of aorta: Secondary | ICD-10-CM | POA: Diagnosis not present

## 2021-06-28 DIAGNOSIS — R112 Nausea with vomiting, unspecified: Secondary | ICD-10-CM | POA: Insufficient documentation

## 2021-06-28 DIAGNOSIS — R1013 Epigastric pain: Secondary | ICD-10-CM | POA: Diagnosis not present

## 2021-06-28 DIAGNOSIS — R109 Unspecified abdominal pain: Secondary | ICD-10-CM | POA: Diagnosis not present

## 2021-06-30 DIAGNOSIS — D485 Neoplasm of uncertain behavior of skin: Secondary | ICD-10-CM | POA: Diagnosis not present

## 2021-06-30 DIAGNOSIS — L821 Other seborrheic keratosis: Secondary | ICD-10-CM | POA: Diagnosis not present

## 2021-06-30 DIAGNOSIS — D2261 Melanocytic nevi of right upper limb, including shoulder: Secondary | ICD-10-CM | POA: Diagnosis not present

## 2021-06-30 DIAGNOSIS — D225 Melanocytic nevi of trunk: Secondary | ICD-10-CM | POA: Diagnosis not present

## 2021-06-30 DIAGNOSIS — D0462 Carcinoma in situ of skin of left upper limb, including shoulder: Secondary | ICD-10-CM | POA: Diagnosis not present

## 2021-06-30 DIAGNOSIS — Z85828 Personal history of other malignant neoplasm of skin: Secondary | ICD-10-CM | POA: Diagnosis not present

## 2021-06-30 DIAGNOSIS — L570 Actinic keratosis: Secondary | ICD-10-CM | POA: Diagnosis not present

## 2021-07-01 ENCOUNTER — Telehealth: Payer: Self-pay

## 2021-07-01 NOTE — Telephone Encounter (Signed)
Attempted to call pt to complete pre visit planning. LM for pt to cb

## 2021-07-02 ENCOUNTER — Other Ambulatory Visit: Payer: Self-pay

## 2021-07-02 ENCOUNTER — Ambulatory Visit (INDEPENDENT_AMBULATORY_CARE_PROVIDER_SITE_OTHER): Payer: Medicare Other | Admitting: Family Medicine

## 2021-07-02 ENCOUNTER — Encounter: Payer: Self-pay | Admitting: Family Medicine

## 2021-07-02 VITALS — BP 120/78 | HR 71 | Temp 98.5°F | Ht 64.0 in | Wt 117.8 lb

## 2021-07-02 DIAGNOSIS — R748 Abnormal levels of other serum enzymes: Secondary | ICD-10-CM | POA: Diagnosis not present

## 2021-07-02 DIAGNOSIS — M799 Soft tissue disorder, unspecified: Secondary | ICD-10-CM | POA: Insufficient documentation

## 2021-07-02 DIAGNOSIS — K5900 Constipation, unspecified: Secondary | ICD-10-CM

## 2021-07-02 DIAGNOSIS — E1122 Type 2 diabetes mellitus with diabetic chronic kidney disease: Secondary | ICD-10-CM | POA: Diagnosis not present

## 2021-07-02 DIAGNOSIS — Z23 Encounter for immunization: Secondary | ICD-10-CM | POA: Diagnosis not present

## 2021-07-02 DIAGNOSIS — R1110 Vomiting, unspecified: Secondary | ICD-10-CM | POA: Diagnosis not present

## 2021-07-02 DIAGNOSIS — F418 Other specified anxiety disorders: Secondary | ICD-10-CM | POA: Diagnosis not present

## 2021-07-02 DIAGNOSIS — L659 Nonscarring hair loss, unspecified: Secondary | ICD-10-CM | POA: Diagnosis not present

## 2021-07-02 DIAGNOSIS — N184 Chronic kidney disease, stage 4 (severe): Secondary | ICD-10-CM | POA: Diagnosis not present

## 2021-07-02 DIAGNOSIS — D696 Thrombocytopenia, unspecified: Secondary | ICD-10-CM | POA: Diagnosis not present

## 2021-07-02 DIAGNOSIS — Z794 Long term (current) use of insulin: Secondary | ICD-10-CM

## 2021-07-02 LAB — CBC
HCT: 43.8 % (ref 36.0–46.0)
Hemoglobin: 14.3 g/dL (ref 12.0–15.0)
MCHC: 32.7 g/dL (ref 30.0–36.0)
MCV: 93.6 fl (ref 78.0–100.0)
Platelets: 154 10*3/uL (ref 150.0–400.0)
RBC: 4.68 Mil/uL (ref 3.87–5.11)
RDW: 13.8 % (ref 11.5–15.5)
WBC: 6.2 10*3/uL (ref 4.0–10.5)

## 2021-07-02 MED ORDER — BASAGLAR KWIKPEN 100 UNIT/ML ~~LOC~~ SOPN: 8.0000 [IU] | PEN_INJECTOR | Freq: Every day | SUBCUTANEOUS | 0 refills | Status: DC

## 2021-07-02 NOTE — Assessment & Plan Note (Signed)
Soft tissue density noted around the umbilicus on CT imaging.  She has a benign exam today.  It is certainly possible this could have been related to wherever she injected her insulin that day.  She will monitor the area and if she notices any lesions she will let us know.

## 2021-07-02 NOTE — Assessment & Plan Note (Signed)
Recheck labs today. 

## 2021-07-02 NOTE — Progress Notes (Signed)
Tommi Rumps, MD Phone: [phone]  Kelly [name] is a 81 y.o. female who presents today for f/u.  DIABETES Disease Monitoring: Blood Sugar ranges-checking with libre, 60% in range, goes up after eating her first meal of the day.  Oftentimes she will take her Basaglar after she eats. Polyuria/phagia/dipsia- no      Optho-up-to-date Medications: Compliance- taking farxiga 5 mg daily, basaglar 7 u daily, victoza 1.2 mg daily Hypoglycemic symptoms- no The patient reports she has been eating more fish and salad.  She is trying to limit sweets and fried foods as well as carbohydrates.  She has reduced her soda intake to 3 cans/week.  No sweet tea.  No exercise.  Anxiety/depression: The patient saw Dr. Terese Door earlier this month for nausea and vomiting that was occurring once weekly x3 weeks.  There was some concern that this was related to her Cymbalta.  She was transitioned over to Zoloft and just started on this recently.  She does note the nausea has improved and she has not had any further vomiting.  She does note anxiety and depression issues as she has transitioned off of the Cymbalta.  She notes no SI.  Nausea/vomiting: During this work-up she was found to have a minimally elevated lipase.  She does not drink alcohol.  She notes no abdominal pain.  She notes she did not report having abdominal pain to Dr. Terese Door despite it being listed under the reason for the CT.  CT imaging revealed a normal pancreas.  It did reveal extensive stool in the colon.  She reports having a bowel movement daily.  She occasionally strains to have a bowel movement.  She had been using stool softeners though notes they ended up giving her diarrhea.  There is also a soft tissue density noted around her umbilicus.  The patient notes that is where she gives herself her insulin injections.  Hair loss: This started 8+ years ago during a particularly stressful time.  Her daughter notes that hair  has grown back to a certain degree.  Social History   Tobacco Use  Smoking Status Never  Smokeless Tobacco Never    Current Outpatient Medications on File Prior to Visit  Medication Sig Dispense Refill   amLODipine (NORVASC) 5 MG tablet TAKE 1 TABLET DAILY 90 tablet 3   aspirin EC 81 MG tablet Take 81 mg by mouth daily.     atorvastatin (LIPITOR) 80 MG tablet Take 1 tablet (80 mg total) by mouth daily. 90 tablet 3   BD PEN NEEDLE NANO U/F 32G X 4 MM MISC USE DAILY WITH VICTOZA AND BASAGLAR 180 each 2   calcitRIOL (ROCALTROL) 0.25 MCG capsule Take 0.25 mcg by mouth daily.     Cholecalciferol (VITAMIN D) 50 MCG (2000 UT) CAPS Take 2,000 Units by mouth daily.      Continuous Blood Gluc Sensor (FREESTYLE LIBRE 2 SENSOR) MISC USE TO CHECK SUGAR AT LEAST4 TIMES A DAY 6 each 3   cyclobenzaprine (FLEXERIL) 10 MG tablet TAKE 1/2 TABLET (5MG  TOTAL)3 TIMES A DAY AS NEEDED    FOR MUSCLE SPASMS 45 tablet 0   dapagliflozin propanediol (FARXIGA) 10 MG TABS tablet Take 1 tablet (10 mg total) by mouth daily. 90 tablet 1   denosumab (PROLIA) 60 MG/ML SOSY injection Inject 60 mg into the skin every 6 (six) months.     furosemide (LASIX) 40 MG tablet Take 40 mg by mouth daily as needed.     gabapentin (NEURONTIN) 100 MG capsule Take  1 capsule (100 mg total) by mouth 3 (three) times daily. 180 capsule 1   glucosamine-chondroitin 500-400 MG tablet Take 1 tablet by mouth 3 (three) times daily.     glucose blood (ACCU-CHEK AVIVA PLUS) test strip Use to check blood sugar up to 4 times daily 100 each 3   HYDROcodone-acetaminophen (NORCO/VICODIN) 5-325 MG tablet Take 1 tablet by mouth every 4 (four) hours as needed.     Lancets MISC Use up to 4 times daily to check blood sugars. 200 each 11   liraglutide (VICTOZA) 18 MG/3ML SOPN Inject 1.2 mg into the skin daily. 18 mL 3   losartan (COZAAR) 50 MG tablet TAKE 1 TABLET DAILY 90 tablet 1   Melatonin 5 MG CHEW Chew 5 mg by mouth daily.     potassium chloride (K-DUR)  10 MEQ tablet Take 10 mEq by mouth daily as needed.     sertraline (ZOLOFT) 25 MG tablet Take 1 tablet (25 mg total) by mouth daily. If in 2-4 weeks mood/anxiety not controlled can increase to 50 mg daily 90 tablet 3   Current Facility-Administered Medications on File Prior to Visit  Medication Dose Route Frequency Provider Last Rate Last Admin   denosumab (PROLIA) injection 60 mg  60 mg Subcutaneous Q6 months [name], MD   60 mg at 04/05/21 1307     ROS see history of present illness  Objective  Physical Exam Vitals:   07/02/21 1127  BP: 120/78  Pulse: 71  Temp: 98.5 F (36.9 C)  SpO2: 97%    BP Readings from Last 3 Encounters:  07/02/21 120/78  06/11/21 122/80  06/10/21 135/80   Wt Readings from Last 3 Encounters:  07/02/21 117 lb 12.8 oz (53.4 kg)  06/11/21 119 lb 9.6 oz (54.3 kg)  01/01/21 120 lb 9.6 oz (54.7 kg)    Physical Exam Constitutional:      General: She is not in acute distress.    Appearance: She is not diaphoretic.  Cardiovascular:     Rate and Rhythm: Normal rate and regular rhythm.     Heart sounds: Normal heart sounds.  Pulmonary:     Effort: Pulmonary effort is normal.     Breath sounds: Normal breath sounds.  Abdominal:     Comments: No palpable lesions around her umbilicus  Skin:    General: Skin is warm and dry.     Comments: Mild hair loss towards the front of her scalp, no scalp inflammation noted  Neurological:     Mental Status: She is alert.     Assessment/Plan: Please see individual problem list.  Problem List Items Addressed This Visit     Constipation    I encouraged daily use of MiraLAX.  Discussed she could take it twice a day if once daily is not enough.      Elevated lipase    Benign imaging.  Has been elevated in the past per chart review.  I will communicate with her prior GI physician to see if any further evaluation would be needed.      Hair loss    Generally mild with no scalp inflammation noted.   This has been generally stable or improved over many years.  Prior TSH was normal 4 years ago.  Discussed the potential for topical medication though I am not sure that would provide much benefit given how long this has been going on.  She opted against any medications and she will monitor.  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  She will continue Zoloft 25 mg once daily.  We will follow-up in about 3 months.      Soft tissue lesion    Soft tissue density noted around the umbilicus on CT imaging.  She has a benign exam today.  It is certainly possible this could have been related to wherever she injected her insulin that day.  She will monitor the area and if she notices any lesions she will let us know.      Thrombocytopenia ([place])    Recheck labs today.      Relevant Orders   CBC   Type 2 diabetes mellitus ([place])    Poorly controlled.  We will increase her Basaglar to 8 units daily.  She will take this 15 minutes before she eats her first meal.  She will monitor for any low sugars with the increased dose of Basaglar.  She will let us know if those occur.  She will continue Victoza 1.2 mg daily and Farxiga 5 mg daily.  She will continue with healthy diet.      Relevant Medications   Insulin Glargine (BASAGLAR KWIKPEN) 100 UNIT/ML   Vomiting    Resolved at this time.  Certainly the Cymbalta could have been playing a role in the nausea and then vomiting.  Her constipation could certainly be playing a role as well.  She will monitor for recurrent vomiting issues.      Other Visit Diagnoses     Need for pneumococcal vaccination       Relevant Orders   Pneumococcal conjugate vaccine 20-valent (Prevnar 20) (Completed)        Health Maintenance: Prevnar 20 given today.  The patient was counseled on this reducing her risk of contracting pneumonia.  Her daughter asked if she could get pneumonia from the shot and I  advised that she could not.  Return in about 3 months (around 09/29/2021) for Diabetes/anxiety.  This visit occurred during the SARS-CoV-2 public health emergency.  Safety protocols were in place, including screening questions prior to the visit, additional usage of staff PPE, and extensive cleaning of exam room while observing appropriate contact time as indicated for disinfecting solutions.   I have spent 48 minutes in the care of this patient regarding History taking, documentation, completion of exam, review of Dr. McLean-Scocuzza's note from 06/11/2021, sending a message to GI, discussion of plan, placing orders.Tommi Rumps, MD [place]

## 2021-07-02 NOTE — Assessment & Plan Note (Signed)
Poorly controlled though she just switched over to Zoloft.  She will continue Zoloft 25 mg once daily.  We will follow-up in about 3 months.

## 2021-07-02 NOTE — Assessment & Plan Note (Signed)
Resolved at this time.  Certainly the Cymbalta could have been playing a role in the nausea and then vomiting.  Her constipation could certainly be playing a role as well.  She will monitor for recurrent vomiting issues.

## 2021-07-02 NOTE — Assessment & Plan Note (Addendum)
Poorly controlled.  We will increase her Basaglar to 8 units daily.  She will take this 15 minutes before she eats her first meal.  She will monitor for any low sugars with the increased dose of Basaglar.  She will let us know if those occur.  She will continue Victoza 1.2 mg daily and Farxiga 5 mg daily.  She will continue with healthy diet.

## 2021-07-02 NOTE — Assessment & Plan Note (Signed)
I encouraged daily use of MiraLAX.  Discussed she could take it twice a day if once daily is not enough.

## 2021-07-02 NOTE — Assessment & Plan Note (Signed)
Generally mild with no scalp inflammation noted.  This has been generally stable or improved over many years.  Prior TSH was normal 4 years ago.  Discussed the potential for topical medication though I am not sure that would provide much benefit given how long this has been going on.  She opted against any medications and she will monitor.

## 2021-07-02 NOTE — Patient Instructions (Signed)
Nice to see you. We will get labs today to follow-up on your platelets. Please increase your Basaglar to 8 units once daily.  You need to try to take this 15 minutes before you eat your midday meal. Please try MiraLAX on a daily basis to help with your stools.

## 2021-07-02 NOTE — Assessment & Plan Note (Signed)
Benign imaging.  Has been elevated in the past per chart review.  I will communicate with her prior GI physician to see if any further evaluation would be needed.

## 2021-07-05 DIAGNOSIS — M542 Cervicalgia: Secondary | ICD-10-CM | POA: Diagnosis not present

## 2021-07-05 DIAGNOSIS — Z20822 Contact with and (suspected) exposure to covid-19: Secondary | ICD-10-CM | POA: Diagnosis not present

## 2021-07-06 ENCOUNTER — Other Ambulatory Visit: Payer: Self-pay | Admitting: Family Medicine

## 2021-07-06 DIAGNOSIS — Z794 Long term (current) use of insulin: Secondary | ICD-10-CM | POA: Diagnosis not present

## 2021-07-06 DIAGNOSIS — E1122 Type 2 diabetes mellitus with diabetic chronic kidney disease: Secondary | ICD-10-CM | POA: Diagnosis not present

## 2021-07-06 DIAGNOSIS — I10 Essential (primary) hypertension: Secondary | ICD-10-CM | POA: Diagnosis not present

## 2021-07-06 DIAGNOSIS — E785 Hyperlipidemia, unspecified: Secondary | ICD-10-CM | POA: Diagnosis not present

## 2021-07-07 ENCOUNTER — Telehealth: Payer: Self-pay | Admitting: Family Medicine

## 2021-07-07 NOTE — Telephone Encounter (Signed)
I called and spoke with the patient and informed her that the provider spoke with Dr. Vicente Males and no other testing is needed because her CT scan was reassuring and she understood.  Laruen Risser,cma  ?

## 2021-07-07 NOTE — Telephone Encounter (Signed)
Please let the patient and her daughter know that I heard back from Dr. Vicente Males.  He noted that the mildly elevated lipase is nonspecific and does not need any more testing given that her CT scan was reassuring. ?

## 2021-07-07 NOTE — Telephone Encounter (Signed)
-----   Message from Jonathon Bellows, MD sent at 07/04/2021  4:26 PM EST ----- ?Hi mildly elevated lipase is non specific and doesn't need any more testing .Ct is reassuring  ? ?Regards ? ?Kiran  ?----- Message ----- ?From: [name], MD ?Sent: 07/02/2021  12:15 PM EST ?To: Jonathon Bellows, MD ? ?Hi Kiran,  ? ?I saw this patient for follow-up today. She previously saw you for a rectal ulcer/benign mass several years ago. She was noted to have an elevated lipase recently and previously had a mildly elevated lipase a couple of years ago. Earlier this month the lipase was 70. She had no abdominal pain. She underwent a CT scan with a normal appearing pancreas. I wanted to check with you to see if she might need any additional work up for this mild lipase elevation. Thanks for your help. ? ?Kelly [name] ? ?

## 2021-07-12 ENCOUNTER — Ambulatory Visit: Payer: Self-pay | Admitting: Pharmacist

## 2021-07-12 NOTE — Chronic Care Management (AMB) (Signed)
?  Chronic Care Management  ? ?Note ? ?07/12/2021 ?Name: Kelly [name] MRN: [id number] DOB: [birth year] ? ? ? ?Closing pharmacy CCM case at this time. Closing pharmacy CCM case at this time. Will collaborate with Care Guide to outreach to schedule follow up with RN CM. Patient has clinic contact information for future questions or concerns.  ? ?[name], PharmD, [place], CPP ?Clinical Pharmacist ?Therapist, music at [employer] ?[phone] ? ?

## 2021-07-19 ENCOUNTER — Ambulatory Visit (INDEPENDENT_AMBULATORY_CARE_PROVIDER_SITE_OTHER): Payer: Medicare Other | Admitting: Internal Medicine

## 2021-07-19 VITALS — BP 150/74 | HR 72 | Resp 16 | Ht 63.0 in | Wt 119.0 lb

## 2021-07-19 DIAGNOSIS — G4733 Obstructive sleep apnea (adult) (pediatric): Secondary | ICD-10-CM | POA: Diagnosis not present

## 2021-07-19 DIAGNOSIS — Z9989 Dependence on other enabling machines and devices: Secondary | ICD-10-CM | POA: Diagnosis not present

## 2021-07-19 DIAGNOSIS — Z7189 Other specified counseling: Secondary | ICD-10-CM | POA: Diagnosis not present

## 2021-07-19 NOTE — Patient Instructions (Signed)

## 2021-07-19 NOTE — Progress Notes (Unsigned)
[facility] [place], [place] [address]  Pulmonary Sleep Medicine   Office Visit Note  Patient Name: Kelly [name] DOB: 04/20/1941 MRN [id number]    Chief Complaint: Obstructive Sleep Apnea visit  Brief History:  Kelly [name] is seen today for follow up visit and CMN renewal. The patient has a 13 year history of sleep apnea. Patient is not using PAP nightly.  The patient feels just ok after sleeping with PAP.  The patient reports benefiting from PAP use and has lost 30 lbs in the past year.Marland Kitchen Epworth Sleepiness Score is 5 out of 24. The patient does not take naps. The patient complains of the following: mask still uncomfortable  The compliance download shows  compliance with an average use time of 3:30 hours. @ 46% The AHI is 0.6  The patient does not complain of limb movements disrupting sleep.  ROS  General: (-) fever, (-) chills, (-) night sweat Nose and Sinuses: (-) nasal stuffiness or itchiness, (-) postnasal drip, (-) nosebleeds, (-) sinus trouble. Mouth and Throat: (-) sore throat, (-) hoarseness. Neck: (-) swollen glands, (-) enlarged thyroid, (-) neck pain. Respiratory: - cough, - shortness of breath, - wheezing. Neurologic: - numbness, - tingling. Psychiatric: - anxiety, - depression   Current Medication: Outpatient Encounter Medications as of 07/19/2021  Medication Sig Note   amLODipine (NORVASC) 5 MG tablet TAKE 1 TABLET DAILY    aspirin EC 81 MG tablet Take 81 mg by mouth daily.    atorvastatin (LIPITOR) 80 MG tablet Take 1 tablet (80 mg total) by mouth daily.    BD PEN NEEDLE NANO 2ND GEN 32G X 4 MM MISC USE DAILY WITH VICTOZA AND BASAGLAR    calcitRIOL (ROCALTROL) 0.25 MCG capsule Take 0.25 mcg by mouth daily.    Cholecalciferol (VITAMIN D) 50 MCG (2000 UT) CAPS Take 2,000 Units by mouth daily.     Continuous Blood Gluc Sensor (FREESTYLE LIBRE 2 SENSOR) MISC USE TO CHECK SUGAR AT LEAST4 TIMES A DAY    cyclobenzaprine (FLEXERIL) 10  MG tablet TAKE 1/2 TABLET (5MG TOTAL)3 TIMES A DAY AS NEEDED    FOR MUSCLE SPASMS    dapagliflozin propanediol (FARXIGA) 10 MG TABS tablet Take 1 tablet (10 mg total) by mouth daily.    denosumab (PROLIA) 60 MG/ML SOSY injection Inject 60 mg into the skin every 6 (six) months.    furosemide (LASIX) 40 MG tablet Take 40 mg by mouth daily as needed.    gabapentin (NEURONTIN) 100 MG capsule Take 1 capsule (100 mg total) by mouth 3 (three) times daily. 01/27/2021: 1 capsule QPM   glucosamine-chondroitin 500-400 MG tablet Take 1 tablet by mouth 3 (three) times daily. 06/16/2020: Reports taking once a day   glucose blood (ACCU-CHEK AVIVA PLUS) test strip Use to check blood sugar up to 4 times daily    HYDROcodone-acetaminophen (NORCO/VICODIN) 5-325 MG tablet Take 1 tablet by mouth every 4 (four) hours as needed.    Insulin Glargine (BASAGLAR KWIKPEN) 100 UNIT/ML INJECT 20 UNITS TOTAL      SUBCUTANEOUSLY DAILY.    Lancets MISC Use up to 4 times daily to check blood sugars.    liraglutide (VICTOZA) 18 MG/3ML SOPN Inject 1.2 mg into the skin daily.    losartan (COZAAR) 50 MG tablet TAKE 1 TABLET DAILY    Melatonin 5 MG CHEW Chew 5 mg by mouth daily.    potassium chloride (K-DUR) 10 MEQ tablet Take 10 mEq by mouth daily as needed.  sertraline (ZOLOFT) 25 MG tablet Take 1 tablet (25 mg total) by mouth daily. If in 2-4 weeks mood/anxiety not controlled can increase to 50 mg daily    Facility-Administered Encounter Medications as of 07/19/2021  Medication   denosumab (PROLIA) injection 60 mg    Surgical History: Past Surgical History:  Procedure Laterality Date   APPENDECTOMY     BREAST BIOPSY Right 06/19/2018   affirm stereo/x clip/COLUMNAR CELL CHANGE WITH MICROCALCIFICATIONS. FIBROCYSTIC CHANGES WITH MICROCALCIFICATIONS   cataract  03/2017   CHOLECYSTECTOMY     COLONOSCOPY     COLONOSCOPY WITH PROPOFOL N/A 08/22/2017   Procedure: COLONOSCOPY WITH PROPOFOL;  Surgeon: Jonathon Bellows, MD;  Location: [facility]  ENDOSCOPY;  Service: Gastroenterology;  Laterality: N/A;   ESOPHAGOGASTRODUODENOSCOPY     ESOPHAGOGASTRODUODENOSCOPY (EGD) WITH PROPOFOL N/A 03/23/2016   Procedure: ESOPHAGOGASTRODUODENOSCOPY (EGD) WITH PROPOFOL;  Surgeon: Manya Silvas, MD;  Location: [facility] ENDOSCOPY;  Service: Endoscopy;  Laterality: N/A;   FLEXIBLE SIGMOIDOSCOPY N/A 02/06/2018   Procedure: FLEXIBLE SIGMOIDOSCOPY;  Surgeon: Jonathon Bellows, MD;  Location: [facility] ENDOSCOPY;  Service: Gastroenterology;  Laterality: N/A;   FLEXIBLE SIGMOIDOSCOPY N/A 12/03/2018   Procedure: FLEXIBLE SIGMOIDOSCOPY;  Surgeon: Jonathon Bellows, MD;  Location: [facility] ENDOSCOPY;  Service: Gastroenterology;  Laterality: N/A;   RECTAL PROLAPSE REPAIR  2012   x2    Medical History: Past Medical History:  Diagnosis Date   Colon polyps    Depression    DM (diabetes mellitus), type 2 with renal complications ([place])    Hyperlipidemia    Hypertension    Kidney stones    Shingles 12/05/2019   Sleep apnea    Squamous cell skin cancer 12/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     Family History: Non contributory to the present illness  Social History: Social History   Socioeconomic History   Marital status: Widowed    Spouse name: Not on file   Number of children: Not on file   Years of education: Not on file   Highest education level: High school graduate  Occupational History   Occupation: retired   Tobacco Use   Smoking status: Never   Smokeless tobacco: Never  Vaping Use   Vaping Use: Never used  Substance and Sexual Activity   Alcohol use: No   Drug use: No   Sexual activity: Not Currently    Partners: Male  Other Topics Concern   Not on file  Social History Narrative   Not on file   Social Determinants of Health   Financial Resource Strain: Low Risk    Difficulty of Paying Living Expenses: Not hard at all  Food Insecurity: No Food Insecurity   Worried About Charity fundraiser in the Last Year: Never true   [place] in the Last  Year: Never true  Transportation Needs: No Transportation Needs   Lack of Transportation (Medical): No   Lack of Transportation (Non-Medical): No  Physical Activity: Insufficiently Active   Days of Exercise per Week: 3 days   Minutes of Exercise per Session: 20 min  Stress: No Stress Concern Present   Feeling of Stress : Only a little  Social Connections: Unknown   Frequency of Communication with Friends and Family: More than three times a week   Frequency of Social Gatherings with Friends and Family: Not on file   Attends Religious Services: Not on file   Active Member of Clubs or Organizations: Not on file   Attends Archivist Meetings: Not on file  Marital Status: Not on file  Intimate Partner Violence: Not At Risk   Fear of Current or Ex-Partner: No   Emotionally Abused: No   Physically Abused: No   Sexually Abused: No    Vital Signs: Blood pressure (!) 150/74, pulse 72, resp. rate 16, height 5' 3"  (1.6 m), weight 119 lb (54 kg), SpO2 96 %. Body mass index is 21.08 kg/m.    Examination: General Appearance: The patient is well-developed, well-nourished, and in no distress. Neck Circumference: 32cm Skin: Gross inspection of skin unremarkable. Head: normocephalic, no gross deformities. Eyes: no gross deformities noted. ENT: ears appear grossly normal Neurologic: Alert and oriented. No involuntary movements.    EPWORTH SLEEPINESS SCALE:  Scale:  (0)= no chance of dozing; (1)= slight chance of dozing; (2)= moderate chance of dozing; (3)= high chance of dozing  Chance  Situtation    Sitting and reading: 0    Watching TV: 1    Sitting Inactive in public: 0    As a passenger in car: 2      Lying down to rest: 2    Sitting and talking: 0    Sitting quielty after lunch: 0    In a car, stopped in traffic: 0   TOTAL SCORE:   5 out of 24    SLEEP STUDIES:  PSG 03/2008 AHI 25 SpO72mn 77%   CPAP COMPLIANCE DATA:  Date Range: 07/19/20 -  07/18/21  Average Daily Use: 3:30 hours  Median Use: 4:54  Compliance for > 4 Hours: 46% days  AHI: 0.6 respiratory events per hour  Days Used: 259/365  Mask Leak: 11.6 lpm  95th Percentile Pressure: 4 cmH2O    LABS: Recent Results (from the past 2160 hour(s))  CBC w/Diff     Status: Abnormal   Collection Time: 06/10/21  2:44 PM  Result Value Ref Range   WBC 6.0 4.0 - 10.5 K/uL   RBC 4.78 3.87 - 5.11 Mil/uL   Hemoglobin 14.8 12.0 - 15.0 g/dL   HCT 45.3 36.0 - 46.0 %   MCV 94.8 78.0 - 100.0 fl   MCHC 32.6 30.0 - 36.0 g/dL   RDW 13.9 11.5 - 15.5 %   Platelets 137.0 (L) 150.0 - 400.0 K/uL   Neutrophils Relative % 73.1 43.0 - 77.0 %   Lymphocytes Relative 18.5 12.0 - 46.0 %   Monocytes Relative 6.3 3.0 - 12.0 %   Eosinophils Relative 1.1 0.0 - 5.0 %   Basophils Relative 1.0 0.0 - 3.0 %   Neutro Abs 4.4 1.4 - 7.7 K/uL   Lymphs Abs 1.1 0.7 - 4.0 K/uL   Monocytes Absolute 0.4 0.1 - 1.0 K/uL   Eosinophils Absolute 0.1 0.0 - 0.7 K/uL   Basophils Absolute 0.1 0.0 - 0.1 K/uL  Comp Met (CMET)     Status: Abnormal   Collection Time: 06/10/21  2:44 PM  Result Value Ref Range   Sodium 142 135 - 145 mEq/L   Potassium 4.0 3.5 - 5.1 mEq/L   Chloride 103 96 - 112 mEq/L   CO2 34 (H) 19 - 32 mEq/L   Glucose, Bld 146 (H) 70 - 99 mg/dL   BUN 22 6 - 23 mg/dL   Creatinine, Ser 1.76 (H) 0.40 - 1.20 mg/dL   Total Bilirubin 1.1 0.2 - 1.2 mg/dL   Alkaline Phosphatase 57 39 - 117 U/L   AST 26 0 - 37 U/L   ALT 26 0 - 35 U/L   Total Protein 6.3 6.0 -  8.3 g/dL   Albumin 4.1 3.5 - 5.2 g/dL   GFR 27.09 (L) >60.00 mL/min    Comment: Calculated using the CKD-EPI Creatinine Equation (2021)   Calcium 9.3 8.4 - 10.5 mg/dL  Lipase     Status: Abnormal   Collection Time: 06/10/21  2:44 PM  Result Value Ref Range   Lipase 70.0 (H) 11.0 - 59.0 U/L  Urinalysis, Routine w reflex microscopic     Status: Abnormal   Collection Time: 06/10/21  2:44 PM  Result Value Ref Range   Color, Urine YELLOW  Yellow;Lt. Yellow;Straw;Dark Yellow;Amber;Green;Red;Brown   APPearance CLEAR Clear;Turbid;Slightly Cloudy;Cloudy   Specific Gravity, Urine 1.020 1.000 - 1.030   pH 5.5 5.0 - 8.0   Total Protein, Urine 30 (A) Negative   Urine Glucose >=1000 (A) Negative   Ketones, ur TRACE (A) Negative   Bilirubin Urine NEGATIVE Negative   Hgb urine dipstick NEGATIVE Negative   Urobilinogen, UA 0.2 0.0 - 1.0   Leukocytes,Ua NEGATIVE Negative   Nitrite NEGATIVE Negative   WBC, UA 0-2/hpf 0-2/hpf   RBC / HPF none seen 0-2/hpf   Squamous Epithelial / LPF Few(5-10/hpf) (A) Rare(0-4/hpf)   Bacteria, UA Rare(<10/hpf) (A) None   Ca Oxalate Crys, UA Presence of (A) None   Yeast, UA Presence of (A) None  POCT glycosylated hemoglobin (Hb A1C)     Status: Abnormal   Collection Time: 06/11/21  9:07 AM  Result Value Ref Range   Hemoglobin A1C 9.3 (A) 4.0 - 5.6 %   HbA1c POC (<> result, manual entry)     HbA1c, POC (prediabetic range)     HbA1c, POC (controlled diabetic range)    Urine Culture     Status: None   Collection Time: 06/11/21  2:27 PM   Specimen: Urine  Result Value Ref Range   MICRO NUMBER: [id number]    SPECIMEN QUALITY: Adequate    Sample Source NOT GIVEN    STATUS: FINAL    Result:      Mixed genital flora isolated. These superficial bacteria are not indicative of a urinary tract infection. No further organism identification is warranted on this specimen. If clinically indicated, recollect clean-catch, mid-stream urine and transfer  immediately to Urine Culture Transport Tube.   CBC     Status: None   Collection Time: 07/02/21 12:02 PM  Result Value Ref Range   WBC 6.2 4.0 - 10.5 K/uL   RBC 4.68 3.87 - 5.11 Mil/uL   Platelets 154.0 150.0 - 400.0 K/uL   Hemoglobin 14.3 12.0 - 15.0 g/dL   HCT 43.8 36.0 - 46.0 %   MCV 93.6 78.0 - 100.0 fl   MCHC 32.7 30.0 - 36.0 g/dL   RDW 13.8 11.5 - 15.5 %    Radiology: CT ABDOMEN PELVIS WO CONTRAST  Result Date: 06/28/2021 CLINICAL DATA:  Abdominal  pain, acute (Ped 0-17y) epigastric pain n/v new x 3 weeks EXAM: CT ABDOMEN AND PELVIS WITHOUT CONTRAST TECHNIQUE: Multidetector CT imaging of the abdomen and pelvis was performed following the standard protocol without IV contrast. RADIATION DOSE REDUCTION: This exam was performed according to the departmental dose-optimization program which includes automated exposure control, adjustment of the mA and/or kV according to patient size and/or use of iterative reconstruction technique. COMPARISON:  CT abdomen pelvis 04/11/2019 FINDINGS: Lower chest: Thin walled paravertebral right lower lobe cystic lesion likely benign in etiology. No acute abnormality. Hepatobiliary: No focal liver abnormality. Status post cholecystectomy. No biliary dilatation. Pancreas: No focal lesion. Normal pancreatic contour. No surrounding  inflammatory changes. No main pancreatic ductal dilatation. Spleen: Normal in size without focal abnormality. Adrenals/Urinary Tract: No adrenal nodule bilaterally. No nephrolithiasis and no hydronephrosis. No definite contour-deforming renal mass. No ureterolithiasis or hydroureter. The urinary bladder is unremarkable. Stomach/Bowel: Stomach is within normal limits. No evidence of bowel wall thickening or dilatation. Couple loops of small bowel within the right lower abdomen demonstrating fecalized material (2:38). Scattered colonic diverticulosis. Stool throughout the ascending, transverse, descending colon. Status post appendectomy. Vascular/Lymphatic: No abdominal aorta or iliac aneurysm. At least moderate atherosclerotic plaque of the aorta and its branches. No abdominal, pelvic, or inguinal lymphadenopathy. Reproductive: Uterus and bilateral adnexa are unremarkable. Other: No intraperitoneal free fluid. No intraperitoneal free gas. No organized fluid collection. Musculoskeletal: Persistent periumbilical subcutaneus soft tissue density. No suspicious lytic or blastic osseous lesions. No acute displaced  fracture. Multilevel severe degenerative changes of the spine with intervertebral disc space vacuum phenomenon and endplate sclerosis at the L2-L3 level as well as endplate sclerosis at the L5-S1 level. Stable grade 2 anterolisthesis of L5 on S1. Small similar-appearing lipomatous lesion of the right gluteus minimus (2:61). IMPRESSION: 1. Colonic diverticulosis with no acute diverticulitis. 2. Constipation. Couple loops of small bowel within the right lower abdomen demonstrating fecalized material. Findings may represent a slow transition state. No findings of bowel obstruction. 3. Persistent periumbilical subcutaneus soft tissue density. Correlate with physical exam. 4.  Aortic Atherosclerosis (ICD10-I70.0). 5. Otherwise limited evaluation on this noncontrast study. Electronically Signed   By: Iven Finn M.D.   On: 06/28/2021 20:50    No results found.  CT ABDOMEN PELVIS WO CONTRAST  Result Date: 06/28/2021 CLINICAL DATA:  Abdominal pain, acute (Ped 0-17y) epigastric pain n/v new x 3 weeks EXAM: CT ABDOMEN AND PELVIS WITHOUT CONTRAST TECHNIQUE: Multidetector CT imaging of the abdomen and pelvis was performed following the standard protocol without IV contrast. RADIATION DOSE REDUCTION: This exam was performed according to the departmental dose-optimization program which includes automated exposure control, adjustment of the mA and/or kV according to patient size and/or use of iterative reconstruction technique. COMPARISON:  CT abdomen pelvis 04/11/2019 FINDINGS: Lower chest: Thin walled paravertebral right lower lobe cystic lesion likely benign in etiology. No acute abnormality. Hepatobiliary: No focal liver abnormality. Status post cholecystectomy. No biliary dilatation. Pancreas: No focal lesion. Normal pancreatic contour. No surrounding inflammatory changes. No main pancreatic ductal dilatation. Spleen: Normal in size without focal abnormality. Adrenals/Urinary Tract: No adrenal nodule bilaterally.  No nephrolithiasis and no hydronephrosis. No definite contour-deforming renal mass. No ureterolithiasis or hydroureter. The urinary bladder is unremarkable. Stomach/Bowel: Stomach is within normal limits. No evidence of bowel wall thickening or dilatation. Couple loops of small bowel within the right lower abdomen demonstrating fecalized material (2:38). Scattered colonic diverticulosis. Stool throughout the ascending, transverse, descending colon. Status post appendectomy. Vascular/Lymphatic: No abdominal aorta or iliac aneurysm. At least moderate atherosclerotic plaque of the aorta and its branches. No abdominal, pelvic, or inguinal lymphadenopathy. Reproductive: Uterus and bilateral adnexa are unremarkable. Other: No intraperitoneal free fluid. No intraperitoneal free gas. No organized fluid collection. Musculoskeletal: Persistent periumbilical subcutaneus soft tissue density. No suspicious lytic or blastic osseous lesions. No acute displaced fracture. Multilevel severe degenerative changes of the spine with intervertebral disc space vacuum phenomenon and endplate sclerosis at the L2-L3 level as well as endplate sclerosis at the L5-S1 level. Stable grade 2 anterolisthesis of L5 on S1. Small similar-appearing lipomatous lesion of the right gluteus minimus (2:61). IMPRESSION: 1. Colonic diverticulosis with no acute diverticulitis. 2. Constipation. Couple loops of  small bowel within the right lower abdomen demonstrating fecalized material. Findings may represent a slow transition state. No findings of bowel obstruction. 3. Persistent periumbilical subcutaneus soft tissue density. Correlate with physical exam. 4.  Aortic Atherosclerosis (ICD10-I70.0). 5. Otherwise limited evaluation on this noncontrast study. Electronically Signed   By: Iven Finn M.D.   On: 06/28/2021 20:50      Assessment and Plan: Patient Active Problem List   Diagnosis Date Noted   Constipation 07/02/2021   Elevated lipase 07/02/2021    Thrombocytopenia (HCC) 07/02/2021   Hair loss 07/02/2021   Soft tissue lesion 07/02/2021   Anxiety 06/11/2021   Trochanteric bursitis, right hip 12/08/2020   Foraminal stenosis of lumbosacral region 12/08/2020   Lumbar herniated disc 12/08/2020   Lumbar radiculopathy 12/02/2020   Rash 09/29/2020   Onychomycosis 08/11/2020   Sleeping difficulty 08/11/2020   Allergic rhinitis 08/11/2020   CPAP use counseling 06/01/2020   Dysuria 03/02/2020   Edema of lower extremity 11/19/2019   Hyperlipidemia 11/15/2019   Low back pain 11/15/2019   Dry eyes 10/01/2019   Tick bite 10/01/2019   Vomiting 10/01/2019   Personal history of other specified conditions 07/03/2019   Fall 04/15/2019   Type 2 diabetes mellitus ([place]) 03/18/2019   Hypertension 03/18/2019   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   Headache disorder 09/10/2018   Cervico-occipital neuralgia 07/23/2018   Osteoporosis 07/23/2018   Cramp and spasm 02/12/2018   Paresthesia of hand 10/25/2017   Lesion of rectum 09/07/2017   Loss of height 08/15/2017   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   Cervical spondylosis without myelopathy 08/15/2017   DDD (degenerative disc disease), cervical 08/15/2017   Bursitis of hip 08/15/2017   Female stress incontinence 08/15/2017   Muscle weakness 08/15/2017   Neck pain 08/15/2017   Spondylolisthesis, congenital 08/15/2017   Radial styloid tenosynovitis 08/15/2017   Senile osteoporosis 08/15/2017   Hip pain 08/15/2017   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   Rectal hemorrhage 07/25/2017   Essential tremor 04/26/2017   Other fatigue 03/22/2017   Left ventricular hypertrophy 10/12/2016   Chronic kidney disease, stage 4 (severe) ([place]) 08/26/2016   OSA on CPAP 08/26/2016   1. OSA on CPAP The patient does tolerate PAP and reports  benefit from PAP use. The patient was reminded how to clean equipment and advised to replace supplies routinely. The patient was  also counselled on using machine more consistently to meet compliance. The compliance is poor. The AHI is 0.6. She needs a new machine so she will work at using it for a minimum of 4.5 hours nightly and we will schedule her to return in 4-6 weeks for reevaluation.  OSA- increase compliance with pap. F/u 4=6weeks.    2. CPAP use counseling CPAP Counseling: had a lengthy discussion with the patient regarding the importance of PAP therapy in management of the sleep apnea. Patient appears to understand the risk factor reduction and also understands the risks associated with untreated sleep apnea. Patient will try to make a good faith effort to remain compliant with therapy. Also instructed the patient on proper cleaning of the device including the water must be changed daily if possible and use of distilled water is preferred. Patient understands that the machine should be regularly cleaned with appropriate recommended cleaning solutions that do not damage the PAP machine for example given white vinegar and water rinses. Other methods such as ozone treatment may not be as good as these simple methods to achieve cleaning.  General Counseling: I have discussed the findings of the evaluation and examination with Angelisse.  I have also discussed any further diagnostic evaluation thatmay be needed or ordered today. Joslynne verbalizes understanding of the findings of todays visit. We also reviewed her medications today and discussed drug interactions and side effects including but not limited excessive drowsiness and altered mental states. We also discussed that there is always a risk not just to her but also people around her. she has been encouraged to call the office with any questions or concerns that should arise related to todays visit.  No orders of the defined types were placed in this encounter.       I have personally obtained a history, examined the patient, evaluated laboratory and  imaging results, formulated the assessment and plan and placed orders. This patient was seen today by Tressie Ellis, PA-C in collaboration with Dr. Devona Konig.   Allyne Gee, MD [facility] Diplomate ABMS Pulmonary Critical Care Medicine and Sleep Medicine

## 2021-07-20 DIAGNOSIS — Z20822 Contact with and (suspected) exposure to covid-19: Secondary | ICD-10-CM | POA: Diagnosis not present

## 2021-07-27 DIAGNOSIS — E559 Vitamin D deficiency, unspecified: Secondary | ICD-10-CM | POA: Diagnosis not present

## 2021-07-27 DIAGNOSIS — M25572 Pain in left ankle and joints of left foot: Secondary | ICD-10-CM | POA: Diagnosis not present

## 2021-07-27 DIAGNOSIS — M5032 Other cervical disc degeneration, mid-cervical region, unspecified level: Secondary | ICD-10-CM | POA: Diagnosis not present

## 2021-07-27 DIAGNOSIS — M810 Age-related osteoporosis without current pathological fracture: Secondary | ICD-10-CM | POA: Diagnosis not present

## 2021-07-27 DIAGNOSIS — M47812 Spondylosis without myelopathy or radiculopathy, cervical region: Secondary | ICD-10-CM | POA: Diagnosis not present

## 2021-07-27 DIAGNOSIS — M7062 Trochanteric bursitis, left hip: Secondary | ICD-10-CM | POA: Diagnosis not present

## 2021-07-27 DIAGNOSIS — Z79899 Other long term (current) drug therapy: Secondary | ICD-10-CM | POA: Diagnosis not present

## 2021-07-27 DIAGNOSIS — E1165 Type 2 diabetes mellitus with hyperglycemia: Secondary | ICD-10-CM | POA: Diagnosis not present

## 2021-07-27 DIAGNOSIS — M6281 Muscle weakness (generalized): Secondary | ICD-10-CM | POA: Diagnosis not present

## 2021-07-27 DIAGNOSIS — M5136 Other intervertebral disc degeneration, lumbar region: Secondary | ICD-10-CM | POA: Diagnosis not present

## 2021-07-27 DIAGNOSIS — T2101XA Burn of unspecified degree of chest wall, initial encounter: Secondary | ICD-10-CM | POA: Diagnosis not present

## 2021-07-27 DIAGNOSIS — Q762 Congenital spondylolisthesis: Secondary | ICD-10-CM | POA: Diagnosis not present

## 2021-07-28 DIAGNOSIS — M542 Cervicalgia: Secondary | ICD-10-CM | POA: Diagnosis not present

## 2021-07-31 DIAGNOSIS — Z20822 Contact with and (suspected) exposure to covid-19: Secondary | ICD-10-CM | POA: Diagnosis not present

## 2021-08-02 DIAGNOSIS — D0462 Carcinoma in situ of skin of left upper limb, including shoulder: Secondary | ICD-10-CM | POA: Diagnosis not present

## 2021-08-04 DIAGNOSIS — M542 Cervicalgia: Secondary | ICD-10-CM | POA: Diagnosis not present

## 2021-08-06 DIAGNOSIS — Z20822 Contact with and (suspected) exposure to covid-19: Secondary | ICD-10-CM | POA: Diagnosis not present

## 2021-08-12 DIAGNOSIS — M542 Cervicalgia: Secondary | ICD-10-CM | POA: Diagnosis not present

## 2021-08-13 DIAGNOSIS — Z20828 Contact with and (suspected) exposure to other viral communicable diseases: Secondary | ICD-10-CM | POA: Diagnosis not present

## 2021-08-13 DIAGNOSIS — Z1152 Encounter for screening for COVID-19: Secondary | ICD-10-CM | POA: Diagnosis not present

## 2021-08-18 DIAGNOSIS — M542 Cervicalgia: Secondary | ICD-10-CM | POA: Diagnosis not present

## 2021-08-23 DIAGNOSIS — Z20822 Contact with and (suspected) exposure to covid-19: Secondary | ICD-10-CM | POA: Diagnosis not present

## 2021-08-24 ENCOUNTER — Telehealth: Payer: Self-pay

## 2021-08-24 NOTE — Progress Notes (Signed)
error 

## 2021-08-30 ENCOUNTER — Ambulatory Visit (INDEPENDENT_AMBULATORY_CARE_PROVIDER_SITE_OTHER): Payer: Medicare Other | Admitting: Internal Medicine

## 2021-08-30 VITALS — BP 136/71 | HR 63 | Resp 14 | Ht 63.0 in | Wt 121.0 lb

## 2021-08-30 DIAGNOSIS — G4733 Obstructive sleep apnea (adult) (pediatric): Secondary | ICD-10-CM | POA: Diagnosis not present

## 2021-08-30 DIAGNOSIS — Z9989 Dependence on other enabling machines and devices: Secondary | ICD-10-CM

## 2021-08-30 DIAGNOSIS — Z7189 Other specified counseling: Secondary | ICD-10-CM

## 2021-08-30 DIAGNOSIS — I10 Essential (primary) hypertension: Secondary | ICD-10-CM

## 2021-08-30 NOTE — Patient Instructions (Signed)

## 2021-08-30 NOTE — Progress Notes (Signed)
[facility] ?[address] ?[place], [place] [address] ? ?Pulmonary Sleep Medicine  ? ?Office Visit Note ? ?Patient Name: Kelly [name] ?DOB: [birth year] ?MRN [id number] ? ? ? ?Chief Complaint: Obstructive Sleep Apnea visit ? ?Brief History: ? ?Kelly [name] is seen today for replacement face to face notes.. The patient has a 13 year history of sleep apnea. Patient is not using PAP nightly  with small N30i nasal mask, last recorded usage was mid-March, per [employer]. download.  The patient feels sleepy after sleeping with PAP.  The patient reports benefiting from PAP use. Epworth Sleepiness Score is 5 out of 24. The patient does not take naps. The patient complains of the following: still feeling sleepy despite using CPAP and wants a new machine  The compliance download shows  compliance with an average use time of 5:15 hours @ 70%. The AHI is .6  The patient does complain of limb movements disrupting sleep. ? ?ROS ? ?General: (-) fever, (-) chills, (-) night sweat ?Nose and Sinuses: (-) nasal stuffiness or itchiness, (-) postnasal drip, (-) nosebleeds, (-) sinus trouble. ?Mouth and Throat: (-) sore throat, (-) hoarseness. ?Neck: (-) swollen glands, (-) enlarged thyroid, (-) neck pain. ?Respiratory: - cough, - shortness of breath, - wheezing. ?Neurologic: - numbness, - tingling. ?Psychiatric: - anxiety, - depression ? ? ?Current Medication: ?Outpatient Encounter Medications as of 08/30/2021  ?Medication Sig Note  ? amLODipine (NORVASC) 5 MG tablet TAKE 1 TABLET DAILY   ? aspirin EC 81 MG tablet Take 81 mg by mouth daily.   ? atorvastatin (LIPITOR) 80 MG tablet Take 1 tablet (80 mg total) by mouth daily.   ? BD PEN NEEDLE NANO 2ND GEN 32G X 4 MM MISC USE DAILY WITH VICTOZA AND BASAGLAR   ? calcitRIOL (ROCALTROL) 0.25 MCG capsule Take 0.25 mcg by mouth daily.   ? Cholecalciferol (VITAMIN D) 50 MCG (2000 UT) CAPS Take 2,000 Units by mouth daily.    ? Continuous Blood Gluc Sensor (FREESTYLE LIBRE 2 SENSOR)  MISC USE TO CHECK SUGAR AT LEAST4 TIMES A DAY   ? cyclobenzaprine (FLEXERIL) 10 MG tablet TAKE 1/2 TABLET ('5MG'$  TOTAL)3 TIMES A DAY AS NEEDED    FOR MUSCLE SPASMS   ? dapagliflozin propanediol (FARXIGA) 10 MG TABS tablet Take 1 tablet (10 mg total) by mouth daily.   ? denosumab (PROLIA) 60 MG/ML SOSY injection Inject 60 mg into the skin every 6 (six) months.   ? furosemide (LASIX) 40 MG tablet Take 40 mg by mouth daily as needed.   ? gabapentin (NEURONTIN) 100 MG capsule Take 1 capsule (100 mg total) by mouth 3 (three) times daily. 01/27/2021: 1 capsule QPM  ? glucosamine-chondroitin 500-400 MG tablet Take 1 tablet by mouth 3 (three) times daily. 06/16/2020: Reports taking once a day  ? glucose blood (ACCU-CHEK AVIVA PLUS) test strip Use to check blood sugar up to 4 times daily   ? HYDROcodone-acetaminophen (NORCO/VICODIN) 5-325 MG tablet Take 1 tablet by mouth every 4 (four) hours as needed.   ? Insulin Glargine (BASAGLAR KWIKPEN) 100 UNIT/ML INJECT 20 UNITS TOTAL      SUBCUTANEOUSLY DAILY.   ? Lancets MISC Use up to 4 times daily to check blood sugars.   ? liraglutide (VICTOZA) 18 MG/3ML SOPN Inject 1.2 mg into the skin daily.   ? losartan (COZAAR) 50 MG tablet TAKE 1 TABLET DAILY   ? Melatonin 5 MG CHEW Chew 5 mg by mouth daily.   ? potassium chloride (K-DUR) 10 MEQ tablet  Take 10 mEq by mouth daily as needed.   ? sertraline (ZOLOFT) 25 MG tablet Take 1 tablet (25 mg total) by mouth daily. If in 2-4 weeks mood/anxiety not controlled can increase to 50 mg daily   ? ?Facility-Administered Encounter Medications as of 08/30/2021  ?Medication  ? denosumab (PROLIA) injection 60 mg  ? ? ?Surgical History: ?Past Surgical History:  ?Procedure Laterality Date  ? APPENDECTOMY    ? BREAST BIOPSY Right 06/19/2018  ? affirm stereo/x clip/COLUMNAR CELL CHANGE WITH MICROCALCIFICATIONS. FIBROCYSTIC CHANGES WITH MICROCALCIFICATIONS  ? cataract  03/2017  ? CHOLECYSTECTOMY    ? COLONOSCOPY    ? COLONOSCOPY WITH PROPOFOL N/A 08/22/2017  ?  Procedure: COLONOSCOPY WITH PROPOFOL;  Surgeon: Jonathon Bellows, MD;  Location: [facility] ENDOSCOPY;  Service: Gastroenterology;  Laterality: N/A;  ? ESOPHAGOGASTRODUODENOSCOPY    ? ESOPHAGOGASTRODUODENOSCOPY (EGD) WITH PROPOFOL N/A 03/23/2016  ? Procedure: ESOPHAGOGASTRODUODENOSCOPY (EGD) WITH PROPOFOL;  Surgeon: Manya Silvas, MD;  Location: [facility] ENDOSCOPY;  Service: Endoscopy;  Laterality: N/A;  ? FLEXIBLE SIGMOIDOSCOPY N/A 02/06/2018  ? Procedure: FLEXIBLE SIGMOIDOSCOPY;  Surgeon: Jonathon Bellows, MD;  Location: [facility] ENDOSCOPY;  Service: Gastroenterology;  Laterality: N/A;  ? FLEXIBLE SIGMOIDOSCOPY N/A 12/03/2018  ? Procedure: FLEXIBLE SIGMOIDOSCOPY;  Surgeon: Jonathon Bellows, MD;  Location: [facility] ENDOSCOPY;  Service: Gastroenterology;  Laterality: N/A;  ? RECTAL PROLAPSE REPAIR  2012  ? x2  ? ? ?Medical History: ?Past Medical History:  ?Diagnosis Date  ? Colon polyps   ? Depression   ? DM (diabetes mellitus), type 2 with renal complications ([place])   ? Hyperlipidemia   ? Hypertension   ? Kidney stones   ? Shingles 12/05/2019  ? Sleep apnea   ? Squamous cell skin cancer 12/2015  ? 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   ? ? ?Family History: ?Non contributory to the present illness ? ?Social History: ?Social History  ? ?Socioeconomic History  ? Marital status: Widowed  ?  Spouse name: Not on file  ? Number of children: Not on file  ? Years of education: Not on file  ? Highest education level: High school graduate  ?Occupational History  ? Occupation: retired   ?Tobacco Use  ? Smoking status: Never  ? Smokeless tobacco: Never  ?Vaping Use  ? Vaping Use: Never used  ?Substance and Sexual Activity  ? Alcohol use: No  ? Drug use: No  ? Sexual activity: Not Currently  ?  Partners: Male  ?Other Topics Concern  ? Not on file  ?Social History Narrative  ? Not on file  ? ?Social Determinants of Health  ? ?Financial Resource Strain: Low Risk   ? Difficulty of Paying Living Expenses: Not hard at all  ?Food Insecurity: No Food Insecurity  ? Worried About  Charity fundraiser in the Last Year: Never true  ? Ran Out of Food in the Last Year: Never true  ?Transportation Needs: No Transportation Needs  ? Lack of Transportation (Medical): No  ? Lack of Transportation (Non-Medical): No  ?Physical Activity: Insufficiently Active  ? Days of Exercise per Week: 3 days  ? Minutes of Exercise per Session: 20 min  ?Stress: No Stress Concern Present  ? Feeling of Stress : Only a little  ?Social Connections: Unknown  ? Frequency of Communication with Friends and Family: More than three times a week  ? Frequency of Social Gatherings with Friends and Family: Not on file  ? Attends Religious Services: Not on file  ? Active Member of Clubs or Organizations: Not on file  ?  Attends Archivist Meetings: Not on file  ? Marital Status: Not on file  ?Intimate Partner Violence: Not At Risk  ? Fear of Current or Ex-Partner: No  ? Emotionally Abused: No  ? Physically Abused: No  ? Sexually Abused: No  ? ? ?Vital Signs: ?Blood pressure 136/71, pulse 63, resp. rate 14, height '5\' 3"'$  (1.6 m), weight 121 lb (54.9 kg), SpO2 95 %. ?Body mass index is 21.43 kg/m?.  ? ? ?Examination: ?General Appearance: The patient is well-developed, well-nourished, and in no distress. ?Neck Circumference: 33cm ?Skin: Gross inspection of skin unremarkable. ?Head: normocephalic, no gross deformities. ?Eyes: no gross deformities noted. ?ENT: ears appear grossly normal ?Neurologic: Alert and oriented. No involuntary movements. ? ? ? ?EPWORTH SLEEPINESS SCALE: ? ?Scale:  ?(0)= no chance of dozing; (1)= slight chance of dozing; (2)= moderate chance of dozing; (3)= high chance of dozing ? ?Chance  Situtation ?   ?Sitting and reading: 0 ?  ? Watching TV: 1 ?   ?Sitting Inactive in public: 0 ?   ?As a passenger in car: 2   ?   ?Lying down to rest: 2 ?   ?Sitting and talking: 0 ?   ?Sitting quielty after lunch: 0 ?   ?In a car, stopped in traffic: 0 ? ? ?TOTAL SCORE:   5 out of 24 ? ? ? ?SLEEP STUDIES: ? ?PSG 03/2008  AHI 25 SpO47mn 77% ? ?CPAP COMPLIANCE DATA: ? ?Date Range:08/01/21 - 08/30/21 ? ?Average Daily Use: 5:05 hours ? ?Median Use: 5:52 hours ? ?Compliance for > 4 Hours: 70% days ? ?AHI: 0.6 respiratory eve

## 2021-09-03 ENCOUNTER — Telehealth: Payer: Self-pay | Admitting: Family Medicine

## 2021-09-03 DIAGNOSIS — F419 Anxiety disorder, unspecified: Secondary | ICD-10-CM

## 2021-09-03 DIAGNOSIS — F339 Major depressive disorder, recurrent, unspecified: Secondary | ICD-10-CM

## 2021-09-03 MED ORDER — SERTRALINE HCL 50 MG PO TABS: 50.0000 mg | ORAL_TABLET | Freq: Every day | ORAL | 2 refills | Status: DC

## 2021-09-03 NOTE — Telephone Encounter (Signed)
I called and spoke with the patient and informed her that the RX for the Zoloft was sent to the pharmacy and it was 50 mg and she is to take only 1 per day and she understood. Oseph Imburgia,cma  ?

## 2021-09-03 NOTE — Telephone Encounter (Signed)
I sent in the 50 mg tablet so she will only t one tablet daily when she receives this prescription.  ?

## 2021-09-03 NOTE — Telephone Encounter (Signed)
Pt called in requesting refill on medication (sertraline (ZOLOFT) 25 MG tablet)... Pt stated that she increase her dosage to two tablets a day... Pt is requesting 90 day supply... Pt requesting for medication to be sent to [facility]... Pt requesting callback  ? ? ?

## 2021-09-09 DIAGNOSIS — Z20822 Contact with and (suspected) exposure to covid-19: Secondary | ICD-10-CM | POA: Diagnosis not present

## 2021-09-16 DIAGNOSIS — R051 Acute cough: Secondary | ICD-10-CM | POA: Diagnosis not present

## 2021-09-16 DIAGNOSIS — R059 Cough, unspecified: Secondary | ICD-10-CM | POA: Diagnosis not present

## 2021-09-16 DIAGNOSIS — Z20822 Contact with and (suspected) exposure to covid-19: Secondary | ICD-10-CM | POA: Diagnosis not present

## 2021-09-22 ENCOUNTER — Telehealth: Payer: Self-pay | Admitting: *Deleted

## 2021-09-22 NOTE — Telephone Encounter (Signed)
Pt returned call and scheduled appt on 10/06/21 ?

## 2021-09-22 NOTE — Telephone Encounter (Addendum)
$  0 due & no Prior Auth required for Prolia. ? Pt due after 10/03/21. ? ?Left voicemail to return call to schedule Prolia inj. ? ? ?

## 2021-09-23 DIAGNOSIS — N2581 Secondary hyperparathyroidism of renal origin: Secondary | ICD-10-CM | POA: Diagnosis not present

## 2021-09-23 DIAGNOSIS — E1122 Type 2 diabetes mellitus with diabetic chronic kidney disease: Secondary | ICD-10-CM | POA: Diagnosis not present

## 2021-09-23 DIAGNOSIS — N1832 Chronic kidney disease, stage 3b: Secondary | ICD-10-CM | POA: Diagnosis not present

## 2021-09-23 DIAGNOSIS — I10 Essential (primary) hypertension: Secondary | ICD-10-CM | POA: Diagnosis not present

## 2021-09-24 ENCOUNTER — Ambulatory Visit
Admission: EM | Admit: 2021-09-24 | Discharge: 2021-09-24 | Disposition: A | Discharge status: Home / Self Care | Payer: Medicare Other | Attending: Emergency Medicine | Admitting: Emergency Medicine

## 2021-09-24 ENCOUNTER — Encounter: Payer: Self-pay | Admitting: Emergency Medicine

## 2021-09-24 DIAGNOSIS — S81801A Unspecified open wound, right lower leg, initial encounter: Secondary | ICD-10-CM

## 2021-09-24 DIAGNOSIS — L03115 Cellulitis of right lower limb: Secondary | ICD-10-CM

## 2021-09-24 HISTORY — DX: Disorder of kidney and ureter, unspecified: N28.9

## 2021-09-24 MED ORDER — DOXYCYCLINE HYCLATE 100 MG PO CAPS: 100.0000 mg | ORAL_CAPSULE | Freq: Two times a day (BID) | ORAL | 0 refills | Status: AC

## 2021-09-24 NOTE — Discharge Instructions (Addendum)
Take the antibiotic as directed.  Keep your wound clean and dry.  Wash it gently twice a day with soap and water.  Apply an antibiotic cream twice a day.    Follow up with your primary care provider on Monday for a wound recheck.

## 2021-09-24 NOTE — ED Provider Notes (Addendum)
Kelly [name]    CSN: [id number] Arrival date & time: 09/24/21  1512      History   Chief Complaint Chief Complaint  Patient presents with   Wound Check    HPI Kelly [name] is a 81 y.o. female.  Accompanied by her daughter, patient presents with a wound on her right lower leg x3 days.  The wound occurred when she hit her leg with a concrete bowl.  It has gotten red around the edges and has been draining clear yellow drainage.  No fever, chills, or other symptoms.  Treating at home with antibiotic ointment.  Her medical history includes diabetes hypertension, CKD.  Last tetanus 2020.  The history is provided by the patient, a relative and medical records.   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     Patient Active Problem List   Diagnosis Date Noted   Constipation 07/02/2021   Elevated lipase 07/02/2021   Thrombocytopenia ([place]) 07/02/2021   Hair loss 07/02/2021   Soft tissue lesion 07/02/2021   Anxiety 06/11/2021   Trochanteric bursitis, right hip 12/08/2020   Foraminal stenosis of lumbosacral region 12/08/2020   Lumbar herniated disc 12/08/2020   Lumbar radiculopathy 12/02/2020   Rash 09/29/2020   Onychomycosis 08/11/2020   Sleeping difficulty 08/11/2020   Allergic rhinitis 08/11/2020   CPAP use counseling 06/01/2020   Dysuria 03/02/2020   Edema of lower extremity 11/19/2019   Hyperlipidemia 11/15/2019   Low back pain 11/15/2019   Dry eyes 10/01/2019   Tick bite 10/01/2019   Vomiting 10/01/2019   Personal history of other specified conditions 07/03/2019   Fall 04/15/2019   Type 2 diabetes mellitus ([place]) 03/18/2019   Hypertension 03/18/2019   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   Headache disorder 09/10/2018   Cervico-occipital neuralgia 07/23/2018   Osteoporosis 07/23/2018   Cramp and spasm 02/12/2018   Paresthesia of hand 10/25/2017   Lesion of rectum 09/07/2017   Loss of height 08/15/2017   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   Cervical spondylosis without myelopathy 08/15/2017   DDD (degenerative disc disease), cervical 08/15/2017   Bursitis of hip 08/15/2017   Female stress incontinence 08/15/2017   Muscle weakness 08/15/2017   Neck pain 08/15/2017   Spondylolisthesis, congenital 08/15/2017   Radial styloid tenosynovitis 08/15/2017   Senile osteoporosis 08/15/2017   Hip pain 08/15/2017   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   Rectal hemorrhage 07/25/2017   Essential tremor 04/26/2017   Other fatigue 03/22/2017   Left ventricular hypertrophy 10/12/2016   Chronic kidney disease, stage 4 (severe) ([place]) 08/26/2016   OSA on CPAP 08/26/2016    Past Surgical History:  Procedure Laterality Date   APPENDECTOMY     BREAST BIOPSY Right 06/19/2018   affirm stereo/x clip/COLUMNAR CELL CHANGE WITH MICROCALCIFICATIONS. FIBROCYSTIC CHANGES WITH MICROCALCIFICATIONS   cataract  03/2017   CHOLECYSTECTOMY     COLONOSCOPY     COLONOSCOPY WITH PROPOFOL N/A 08/22/2017   Procedure: COLONOSCOPY WITH PROPOFOL;  Surgeon: Jonathon Bellows, MD;  Location: [facility] ENDOSCOPY;  Service: Gastroenterology;  Laterality: N/A;   ESOPHAGOGASTRODUODENOSCOPY     ESOPHAGOGASTRODUODENOSCOPY (EGD) WITH PROPOFOL N/A 03/23/2016  Procedure: ESOPHAGOGASTRODUODENOSCOPY (EGD) WITH PROPOFOL;  Surgeon: Manya Silvas, MD;  Location: [facility] ENDOSCOPY;  Service: Endoscopy;  Laterality: N/A;   FLEXIBLE SIGMOIDOSCOPY N/A 02/06/2018   Procedure: FLEXIBLE SIGMOIDOSCOPY;  Surgeon: Jonathon Bellows, MD;  Location: [facility] ENDOSCOPY;  Service: Gastroenterology;  Laterality: N/A;   FLEXIBLE SIGMOIDOSCOPY N/A 12/03/2018   Procedure: FLEXIBLE SIGMOIDOSCOPY;  Surgeon: Jonathon Bellows,  MD;  Location: [facility] ENDOSCOPY;  Service: Gastroenterology;  Laterality: N/A;   RECTAL PROLAPSE REPAIR  2012   x2    OB History   No obstetric history on file.      Home Medications    Prior to Admission medications   Medication Sig Start Date End Date Taking? Authorizing Provider  amLODipine (NORVASC) 5 MG tablet TAKE 1 TABLET DAILY 02/05/21  Yes [name], MD  aspirin EC 81 MG tablet Take 81 mg by mouth daily.   Yes [provider]  atorvastatin (LIPITOR) 80 MG tablet Take 1 tablet (80 mg total) by mouth daily. 10/14/20  Yes [name], MD  calcitRIOL (ROCALTROL) 0.25 MCG capsule Take 0.25 mcg by mouth daily.   Yes [provider]  Cholecalciferol (VITAMIN D) 50 MCG (2000 UT) CAPS Take 2,000 Units by mouth daily.    Yes [provider]  dapagliflozin propanediol (FARXIGA) 10 MG TABS tablet Take 1 tablet (10 mg total) by mouth daily. 05/17/21  Yes McLean-Scocuzza, Nino Glow, MD  doxycycline (VIBRAMYCIN) 100 MG capsule Take 1 capsule (100 mg total) by mouth 2 (two) times daily for 7 days. 09/24/21 10/01/21 Yes Sharion Balloon, NP  glucosamine-chondroitin 500-400 MG tablet Take 1 tablet by mouth 3 (three) times daily.   Yes [provider]  HYDROcodone-acetaminophen (NORCO/VICODIN) 5-325 MG tablet Take 1 tablet by mouth every 4 (four) hours as needed.   Yes [provider]  Insulin Glargine (BASAGLAR KWIKPEN) 100 UNIT/ML INJECT 20 UNITS TOTAL      SUBCUTANEOUSLY DAILY. 07/07/21  Yes [name], MD  liraglutide (VICTOZA) 18 MG/3ML SOPN Inject 1.2 mg into the skin daily. 06/08/21  Yes [name], MD  losartan (COZAAR) 50 MG tablet TAKE 1 TABLET DAILY 05/31/21  Yes [name], MD  sertraline (ZOLOFT) 50 MG tablet Take 1 tablet (50 mg total) by mouth daily. 09/03/21  Yes [name], MD  BD PEN NEEDLE NANO 2ND GEN 32G X 4 MM MISC USE DAILY WITH VICTOZA AND BASAGLAR 07/07/21   [name], MD  Continuous Blood  Gluc Sensor (FREESTYLE LIBRE 2 SENSOR) MISC USE TO CHECK SUGAR AT LEAST4 TIMES A DAY 11/18/20   [name], MD  cyclobenzaprine (FLEXERIL) 10 MG tablet TAKE 1/2 TABLET ('5MG'$  TOTAL)3 TIMES A DAY AS NEEDED    FOR MUSCLE SPASMS 01/28/20   [name], MD  denosumab (PROLIA) 60 MG/ML SOSY injection Inject 60 mg into the skin every 6 (six) months.    [provider]  furosemide (LASIX) 40 MG tablet Take 40 mg by mouth daily as needed.    [provider]  gabapentin (NEURONTIN) 100 MG capsule Take 1 capsule (100 mg total) by mouth 3 (three) times daily. 12/02/20   [name], MD  glucose blood (ACCU-CHEK AVIVA PLUS) test strip Use to check blood sugar up to 4 times daily 07/30/18   [name], MD  Lancets MISC Use up to 4 times daily to check blood sugars. 11/04/16   Coral Spikes, DO  Melatonin 5 MG CHEW Chew 5 mg by  mouth daily.    [provider]  potassium chloride (K-DUR) 10 MEQ tablet Take 10 mEq by mouth daily as needed.    [provider]    Family History Family History  Problem Relation Age of Onset   Stroke Mother    Arthritis Mother    Aortic aneurysm Sister    Lung cancer Sister    Heart attack Father 88   Breast cancer Sister 25    Social History Social History   Tobacco Use   Smoking status: Never   Smokeless tobacco: Never  Vaping Use   Vaping Use: Never used  Substance Use Topics   Alcohol use: No   Drug use: No     Allergies   Prilosec otc [omeprazole magnesium], Sucralfate, Amoxicillin, and Morphine and related   Review of Systems Review of Systems  Constitutional:  Negative for chills and fever.  Musculoskeletal:  Negative for gait problem.  Skin:  Positive for color change and wound.  All other systems reviewed and are negative.   Physical Exam Triage Vital Signs ED Triage Vitals  Enc Vitals Group     BP      Pulse      Resp      Temp      Temp src      SpO2      Weight      Height       Head Circumference      Peak Flow      Pain Score      Pain Loc      Pain Edu?      Excl. in [place]?    No data found.  Updated Vital Signs BP (!) 150/63   Pulse 72   Temp 97.9 F (36.6 C)   Resp 18   SpO2 98%   Visual Acuity Right Eye Distance:   Left Eye Distance:   Bilateral Distance:    Right Eye Near:   Left Eye Near:    Bilateral Near:     Physical Exam Vitals and nursing note reviewed.  Constitutional:      General: She is not in acute distress.    Appearance: Normal appearance. She is well-developed. She is not ill-appearing.  HENT:     Mouth/Throat:     Mouth: Mucous membranes are moist.  Cardiovascular:     Rate and Rhythm: Normal rate and regular rhythm.  Pulmonary:     Effort: Pulmonary effort is normal. No respiratory distress.  Musculoskeletal:        General: No swelling or deformity. Normal range of motion.     Cervical back: Neck supple.  Skin:    General: Skin is warm and dry.     Capillary Refill: Capillary refill takes less than 2 seconds.     Findings: Erythema and lesion present.     Comments: Skin tear on right lower leg; localized erythema.  See picture for details.   Neurological:     Mental Status: She is alert.     Gait: Gait normal.  Psychiatric:        Mood and Affect: Mood normal.        Behavior: Behavior normal.      UC Treatments / Results  Labs (all labs ordered are listed, but only abnormal results are displayed) Labs Reviewed - No data to display  EKG   Radiology No results found.  Procedures Procedures (including critical care time)  Medications Ordered in UC Medications -  No data to display  Initial Impression / Assessment and Plan / UC Course  I have reviewed the triage vital signs and the nursing notes.  Pertinent labs & imaging results that were available during my care of the patient were reviewed by me and considered in my medical decision making (see chart for details).    Cellulitis and open  wound on RLL.  Patient is diabetic and has CKD.  Treating with doxycycline.  Wound care instructions discussed.  Signs of worsening infection discussed.  ED precautions discussed.  Education provided on cellulitis.  Instructed patient to follow-up with her PCP on Monday for wound check.  She agrees to plan of care.  Final Clinical Impressions(s) / UC Diagnoses   Final diagnoses:  Cellulitis of right lower leg  Open wound of right lower leg, initial encounter     Discharge Instructions      Take the antibiotic as directed.  Keep your wound clean and dry.  Wash it gently twice a day with soap and water.  Apply an antibiotic cream twice a day.    Follow up with your primary care provider on Monday for a wound recheck.        ED Prescriptions     Medication Sig Dispense Auth. Provider   doxycycline (VIBRAMYCIN) 100 MG capsule Take 1 capsule (100 mg total) by mouth 2 (two) times daily for 7 days. 14 capsule Sharion Balloon, NP      PDMP not reviewed this encounter.   Sharion Balloon, NP 09/24/21 1603    Sharion Balloon, NP 09/24/21 [phone]

## 2021-09-24 NOTE — ED Triage Notes (Signed)
Patient c/o wound on RT lower leg that occurred 3 days ago.   Patient denies pain.   Patient states " I dropped the concrete bowl on my leg".   Patient endorses pus type drainage this morning.   History of DM.

## 2021-09-25 ENCOUNTER — Other Ambulatory Visit: Payer: Self-pay | Admitting: Family Medicine

## 2021-09-25 DIAGNOSIS — E1122 Type 2 diabetes mellitus with diabetic chronic kidney disease: Secondary | ICD-10-CM

## 2021-09-27 ENCOUNTER — Telehealth: Payer: Self-pay | Admitting: Family Medicine

## 2021-09-27 ENCOUNTER — Encounter: Payer: Self-pay | Admitting: Emergency Medicine

## 2021-09-27 ENCOUNTER — Ambulatory Visit
Admission: EM | Admit: 2021-09-27 | Discharge: 2021-09-27 | Disposition: A | Discharge status: Home / Self Care | Payer: Medicare Other

## 2021-09-27 DIAGNOSIS — S81801S Unspecified open wound, right lower leg, sequela: Secondary | ICD-10-CM

## 2021-09-27 DIAGNOSIS — E1122 Type 2 diabetes mellitus with diabetic chronic kidney disease: Secondary | ICD-10-CM

## 2021-09-27 DIAGNOSIS — E785 Hyperlipidemia, unspecified: Secondary | ICD-10-CM

## 2021-09-27 DIAGNOSIS — I10 Essential (primary) hypertension: Secondary | ICD-10-CM | POA: Diagnosis not present

## 2021-09-27 DIAGNOSIS — N2581 Secondary hyperparathyroidism of renal origin: Secondary | ICD-10-CM | POA: Diagnosis not present

## 2021-09-27 DIAGNOSIS — T304 Corrosion of unspecified body region, unspecified degree: Secondary | ICD-10-CM

## 2021-09-27 DIAGNOSIS — N1832 Chronic kidney disease, stage 3b: Secondary | ICD-10-CM | POA: Diagnosis not present

## 2021-09-27 NOTE — Telephone Encounter (Signed)
Kelly [name] called in requesting for lab... Advised Kelly [name] that no lab order in system... Kelly [name] stated that she was wondering if she needed labs... Kelly [name] stated that sometime when she come for office visit that Dr. Caryl Bis wanted her to have lab but no one told her... Kelly [name] requesting callback

## 2021-09-27 NOTE — ED Provider Notes (Signed)
[name]    CSN: [id number] Arrival date & time: 09/27/21  1359      History   Chief Complaint Chief Complaint  Patient presents with   Wound Check    HPI Kelly [name] is a 81 y.o. female who presents with her daughter for R lower leg wound check. Her PCP could not see her this week. She was seen here 5/19   2-While here her daughter also noticed a red area behind her knee. Pt states she had chigger bites and she applied straight bleach on it a few days ago, iodine, new skin ( skin glue) a few days ago . She denies pain.    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     Patient Active Problem List   Diagnosis Date Noted   Constipation 07/02/2021   Elevated lipase 07/02/2021   Thrombocytopenia ([place]) 07/02/2021   Hair loss 07/02/2021   Soft tissue lesion 07/02/2021   Anxiety 06/11/2021   Trochanteric bursitis, right hip 12/08/2020   Foraminal stenosis of lumbosacral region 12/08/2020   Lumbar herniated disc 12/08/2020   Lumbar radiculopathy 12/02/2020   Rash 09/29/2020   Onychomycosis 08/11/2020   Sleeping difficulty 08/11/2020   Allergic rhinitis 08/11/2020   CPAP use counseling 06/01/2020   Dysuria 03/02/2020   Edema of lower extremity 11/19/2019   Hyperlipidemia 11/15/2019   Low back pain 11/15/2019   Dry eyes 10/01/2019   Tick bite 10/01/2019   Vomiting 10/01/2019   Personal history of other specified conditions 07/03/2019   Fall 04/15/2019   Type 2 diabetes mellitus ([place]) 03/18/2019   Hypertension 03/18/2019   Hyperparathyroidism due to renal insufficiency ([place]) 03/18/2019   Headache disorder 09/10/2018   Cervico-occipital neuralgia 07/23/2018   Osteoporosis 07/23/2018    Cramp and spasm 02/12/2018   Paresthesia of hand 10/25/2017   Lesion of rectum 09/07/2017   Loss of height 08/15/2017   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   Cervical spondylosis without myelopathy 08/15/2017   DDD (degenerative disc disease), cervical 08/15/2017   Bursitis of hip 08/15/2017   Female stress incontinence 08/15/2017   Muscle weakness 08/15/2017   Neck pain 08/15/2017   Spondylolisthesis, congenital 08/15/2017   Radial styloid tenosynovitis 08/15/2017   Senile osteoporosis 08/15/2017   Hip pain 08/15/2017   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   Rectal hemorrhage 07/25/2017   Essential tremor 04/26/2017   Other fatigue 03/22/2017   Left ventricular hypertrophy 10/12/2016   Chronic kidney disease, stage 4 (severe) ([place]) 08/26/2016   OSA on CPAP 08/26/2016    Past Surgical History:  Procedure Laterality Date   APPENDECTOMY     BREAST BIOPSY Right 06/19/2018   affirm stereo/x clip/COLUMNAR CELL CHANGE WITH MICROCALCIFICATIONS. FIBROCYSTIC CHANGES WITH MICROCALCIFICATIONS   cataract  03/2017   CHOLECYSTECTOMY     COLONOSCOPY     COLONOSCOPY WITH PROPOFOL N/A 08/22/2017   Procedure: COLONOSCOPY WITH PROPOFOL;  Surgeon: Jonathon Bellows, MD;  Location: [facility] ENDOSCOPY;  Service: Gastroenterology;  Laterality: N/A;   ESOPHAGOGASTRODUODENOSCOPY     ESOPHAGOGASTRODUODENOSCOPY (EGD) WITH PROPOFOL N/A 03/23/2016   Procedure: ESOPHAGOGASTRODUODENOSCOPY (EGD) WITH PROPOFOL;  Surgeon: Manya Silvas, MD;  Location:  [facility] ENDOSCOPY;  Service: Endoscopy;  Laterality: N/A;   FLEXIBLE SIGMOIDOSCOPY N/A 02/06/2018   Procedure: FLEXIBLE SIGMOIDOSCOPY;  Surgeon: Jonathon Bellows, MD;  Location[facility] ENDOSCOPY;  Service: Gastroenterology;  Laterality: N/A;   FLEXIBLE SIGMOIDOSCOPY N/A 12/03/2018   Procedure: FLEXIBLE SIGMOIDOSCOPY;  Surgeon: Jonathon Bellows, MD;  Location: [facility] ENDOSCOPY;  Service: Gastroenterology;  Laterality: N/A;   RECTAL PROLAPSE REPAIR  2012   x2    OB History    No obstetric history on file.      Home Medications    Prior to Admission medications   Medication Sig Start Date End Date Taking? Authorizing Provider  amLODipine (NORVASC) 5 MG tablet TAKE 1 TABLET DAILY 02/05/21   [name], MD  aspirin EC 81 MG tablet Take 81 mg by mouth daily.    [provider]  atorvastatin (LIPITOR) 80 MG tablet Take 1 tablet (80 mg total) by mouth daily. 10/14/20   [name], MD  BD PEN NEEDLE NANO 2ND GEN 32G X 4 MM MISC USE DAILY WITH VICTOZA AND BASAGLAR 07/07/21   [name], MD  calcitRIOL (ROCALTROL) 0.25 MCG capsule Take 0.25 mcg by mouth daily.    [provider]  Cholecalciferol (VITAMIN D) 50 MCG (2000 UT) CAPS Take 2,000 Units by mouth daily.     [provider]  Continuous Blood Gluc Sensor (FREESTYLE LIBRE 2 SENSOR) MISC USE TO CHECK SUGAR AT LEAST4 TIMES A DAY 09/27/21   [name], MD  cyclobenzaprine (FLEXERIL) 10 MG tablet TAKE 1/2 TABLET ('5MG'$  TOTAL)3 TIMES A DAY AS NEEDED    FOR MUSCLE SPASMS 01/28/20   [name], MD  dapagliflozin propanediol (FARXIGA) 10 MG TABS tablet Take 1 tablet (10 mg total) by mouth daily. 05/17/21   McLean-Scocuzza, Nino Glow, MD  denosumab (PROLIA) 60 MG/ML SOSY injection Inject 60 mg into the skin every 6 (six) months.    [provider]  doxycycline (VIBRAMYCIN) 100 MG capsule Take 1 capsule (100 mg total) by mouth 2 (two) times daily for 7 days. 09/24/21 10/01/21  Sharion Balloon, NP  furosemide (LASIX) 40 MG tablet Take 40 mg by mouth daily as needed.    [provider]  gabapentin (NEURONTIN) 100 MG capsule Take 1 capsule (100 mg total) by mouth 3 (three) times daily. 12/02/20   [name], MD  glucosamine-chondroitin 500-400 MG tablet Take 1 tablet by mouth 3 (three) times daily.    [provider]  glucose blood (ACCU-CHEK AVIVA PLUS) test strip Use to check blood sugar up to 4 times daily 07/30/18   [name], MD   HYDROcodone-acetaminophen (NORCO/VICODIN) 5-325 MG tablet Take 1 tablet by mouth every 4 (four) hours as needed.    [provider]  Insulin Glargine (BASAGLAR KWIKPEN) 100 UNIT/ML INJECT 20 UNITS TOTAL      SUBCUTANEOUSLY DAILY. 07/07/21   [name], MD  Lancets MISC Use up to 4 times daily to check blood sugars. 11/04/16   Coral Spikes, DO  liraglutide (VICTOZA) 18 MG/3ML SOPN Inject 1.2 mg into the skin daily. 06/08/21   [name], MD  losartan (COZAAR) 50 MG tablet TAKE 1 TABLET DAILY 05/31/21   [name], MD  Melatonin 5 MG CHEW Chew 5 mg by mouth daily.    [provider]  potassium chloride (K-DUR) 10 MEQ tablet Take 10 mEq by mouth daily as needed.    [provider]  sertraline (ZOLOFT) 50 MG tablet  Take 1 tablet (50 mg total) by mouth daily. 09/03/21   [name], MD    Family History Family History  Problem Relation Age of Onset   Stroke Mother    Arthritis Mother    Aortic aneurysm Sister    Lung cancer Sister    Heart attack Father 72   Breast cancer Sister 57    Social History Social History   Tobacco Use   Smoking status: Never   Smokeless tobacco: Never  Vaping Use   Vaping Use: Never used  Substance Use Topics   Alcohol use: No   Drug use: No     Allergies   Prilosec otc [omeprazole magnesium], Sucralfate, Amoxicillin, and Morphine and related   Review of Systems Review of Systems   Physical Exam Triage Vital Signs ED Triage Vitals [09/27/21 1432]  Enc Vitals Group     BP      Pulse      Resp      Temp      Temp src      SpO2      Weight      Height      Head Circumference      Peak Flow      Pain Score 0     Pain Loc      Pain Edu?      Excl. in [place]?    No data found.  Updated Vital Signs There were no vitals taken for this visit.  Visual Acuity Right Eye Distance:   Left Eye Distance:   Bilateral Distance:    Right Eye Near:   Left Eye Near:    Bilateral Near:      Physical Exam Vitals and nursing note reviewed.  Constitutional:      General: She is not in acute distress. HENT:     Right Ear: External ear normal.     Left Ear: External ear normal.  Eyes:     General: No scleral icterus.    Conjunctiva/sclera: Conjunctivae normal.  Cardiovascular:     Pulses: Normal pulses.  Pulmonary:     Effort: Pulmonary effort is normal.  Musculoskeletal:        General: Normal range of motion.     Cervical back: Neck supple.  Skin:    General: Skin is warm and dry.     Comments: V flap laceration on lower shin is clean, mild warmth presents. Minimal tenderness present. No red streaks from this area.    She has dry skin erythematous patches behind her medial R knee which is shiny and orange tinge. Is not hot or painful.    Neurological:     Mental Status: She is alert and oriented to person, place, and time.     Gait: Gait normal.  Psychiatric:        Mood and Affect: Mood normal.        Behavior: Behavior normal.        Thought Content: Thought content normal.        Judgment: Judgment normal.     UC Treatments / Results  Labs (all labs ordered are listed, but only abnormal results are displayed) Labs Reviewed - No data to display  EKG   Radiology No results found.  Procedures Procedures (including critical care time)  Medications Ordered in UC Medications - No data to display  Initial Impression / Assessment and Plan / UC Course  I have reviewed the triage vital signs and the nursing  notes.  Laceration healing well Local skin chemical irritation with no signs of infection  See instructions.  Final Clinical Impressions(s) / UC Diagnoses   Final diagnoses:  None   Discharge Instructions   None    ED Prescriptions   None    PDMP not reviewed this encounter.   [name], [place] 09/27/21 1647

## 2021-09-27 NOTE — Telephone Encounter (Signed)
I just placed lab orders.  They can be scheduled for labs 1 to 2 days prior to her appointment.

## 2021-09-27 NOTE — Discharge Instructions (Addendum)
Once the liquid Band-Aid peals off is OK to apply cream ointment on it. Keep appointment with your PCP this Friday.  Keep wound open when home and not very active

## 2021-09-27 NOTE — ED Triage Notes (Signed)
Pt was seen 09/24/21 for a wound to her right lower leg. She is back today to have it rechecked. Pt denies any concerns at the moment.

## 2021-09-27 NOTE — Telephone Encounter (Signed)
I called and LVM informing the patient to call back and schedule a lab appointment 2 days prior to her appointment on 10/01/21.  Labs are ordered.  Shaheem Pichon,cma

## 2021-09-29 ENCOUNTER — Other Ambulatory Visit (INDEPENDENT_AMBULATORY_CARE_PROVIDER_SITE_OTHER): Payer: Medicare Other

## 2021-09-29 DIAGNOSIS — E1122 Type 2 diabetes mellitus with diabetic chronic kidney disease: Secondary | ICD-10-CM

## 2021-09-29 DIAGNOSIS — N184 Chronic kidney disease, stage 4 (severe): Secondary | ICD-10-CM | POA: Diagnosis not present

## 2021-09-29 DIAGNOSIS — E785 Hyperlipidemia, unspecified: Secondary | ICD-10-CM | POA: Diagnosis not present

## 2021-09-29 DIAGNOSIS — Z794 Long term (current) use of insulin: Secondary | ICD-10-CM

## 2021-09-29 LAB — COMPREHENSIVE METABOLIC PANEL
ALT: 13 U/L (ref 0–35)
AST: 16 U/L (ref 0–37)
Albumin: 3.9 g/dL (ref 3.5–5.2)
Alkaline Phosphatase: 94 U/L (ref 39–117)
BUN: 20 mg/dL (ref 6–23)
CO2: 32 mEq/L (ref 19–32)
Calcium: 9.8 mg/dL (ref 8.4–10.5)
Chloride: 106 mEq/L (ref 96–112)
Creatinine, Ser: 1.84 mg/dL — ABNORMAL HIGH (ref 0.40–1.20)
GFR: 25.63 mL/min — ABNORMAL LOW (ref >60.00)
Glucose, Bld: 186 mg/dL — ABNORMAL HIGH (ref 70–99)
Potassium: 4 mEq/L (ref 3.5–5.1)
Sodium: 144 mEq/L (ref 135–145)
Total Bilirubin: 0.7 mg/dL (ref 0.2–1.2)
Total Protein: 6 g/dL (ref 6.0–8.3)

## 2021-09-29 LAB — LIPID PANEL
Cholesterol: 114 mg/dL (ref 0–200)
HDL: 39.9 mg/dL (ref >39.00)
LDL Cholesterol: 57 mg/dL (ref 0–99)
NonHDL: 74.06
Total CHOL/HDL Ratio: 3
Triglycerides: 86 mg/dL (ref 0.0–149.0)
VLDL: 17.2 mg/dL (ref 0.0–40.0)

## 2021-09-29 LAB — HEMOGLOBIN A1C: Hgb A1c MFr Bld: 8.5 % — ABNORMAL HIGH (ref 4.6–6.5)

## 2021-10-01 ENCOUNTER — Ambulatory Visit (INDEPENDENT_AMBULATORY_CARE_PROVIDER_SITE_OTHER): Payer: Medicare Other | Admitting: Family Medicine

## 2021-10-01 ENCOUNTER — Encounter: Payer: Self-pay | Admitting: Family Medicine

## 2021-10-01 VITALS — Ht 63.0 in | Wt 123.0 lb

## 2021-10-01 DIAGNOSIS — Z794 Long term (current) use of insulin: Secondary | ICD-10-CM | POA: Diagnosis not present

## 2021-10-01 DIAGNOSIS — F418 Other specified anxiety disorders: Secondary | ICD-10-CM | POA: Diagnosis not present

## 2021-10-01 DIAGNOSIS — E785 Hyperlipidemia, unspecified: Secondary | ICD-10-CM

## 2021-10-01 DIAGNOSIS — E1122 Type 2 diabetes mellitus with diabetic chronic kidney disease: Secondary | ICD-10-CM | POA: Diagnosis not present

## 2021-10-01 DIAGNOSIS — S81819A Laceration without foreign body, unspecified lower leg, initial encounter: Secondary | ICD-10-CM

## 2021-10-01 DIAGNOSIS — N184 Chronic kidney disease, stage 4 (severe): Secondary | ICD-10-CM | POA: Diagnosis not present

## 2021-10-01 DIAGNOSIS — I10 Essential (primary) hypertension: Secondary | ICD-10-CM

## 2021-10-01 MED ORDER — VICTOZA 18 MG/3ML ~~LOC~~ SOPN: 1.8000 mg | PEN_INJECTOR | Freq: Every day | SUBCUTANEOUS | 1 refills | Status: DC

## 2021-10-01 NOTE — Assessment & Plan Note (Signed)
Poorly controlled.  We will increase her Victoza to 1.8 mg daily.  She will continue Basaglar 8 units daily.  Follow-up in 3 months.  Labs reviewed with patient.

## 2021-10-01 NOTE — Assessment & Plan Note (Signed)
Patient reports her anxiety is better.  She will continue Zoloft 50 mg daily.

## 2021-10-01 NOTE — Progress Notes (Signed)
Virtual Visit via telephone Note  This visit type was conducted due to national recommendations for restrictions regarding the COVID-19 pandemic (e.g. social distancing).  This format is felt to be most appropriate for this patient at this time.  All issues noted in this document were discussed and addressed.  No physical exam was performed (except for noted visual exam findings with Video Visits).   I connected with Kelly [name] today at  1:45 PM EDT by telephone and verified that I am speaking with the correct person using two identifiers. Location patient: home Location provider: home office Persons participating in the virtual visit: patient, provider  I discussed the limitations, risks, security and privacy concerns of performing an evaluation and management service by telephone and the availability of in person appointments. I also discussed with the patient that there may be a patient responsible charge related to this service. The patient expressed understanding and agreed to proceed.  Interactive audio and video telecommunications were attempted between this provider and patient, however failed, due to patient having technical difficulties OR patient did not have access to video capability.  We continued and completed visit with audio only.   Reason for visit: f/u  HPI: DIABETES Disease Monitoring: Blood Sugar ranges-200s Polyuria/phagia/dipsia- no      Optho- UTD Medications: Compliance- taking basaglar 8 u daily, victoza 1.2 mg daily Hypoglycemic symptoms- no  Anxiety: Patient reports this has improved with the increased dose of Zoloft.  She notes no depression.  Cellulitis/lower extremity wound: Patient was evaluated in urgent care.  She was placed on doxycycline and advised on wound care.  She has 2 more doses of doxycycline left.  She notes the area is healing well.    ROS: See pertinent positives and negatives per HPI.  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     Past Surgical History:  Procedure Laterality Date   APPENDECTOMY     BREAST BIOPSY Right 06/19/2018   affirm stereo/x clip/COLUMNAR CELL CHANGE WITH MICROCALCIFICATIONS. FIBROCYSTIC CHANGES WITH MICROCALCIFICATIONS   cataract  03/2017   CHOLECYSTECTOMY     COLONOSCOPY     COLONOSCOPY WITH PROPOFOL N/A 08/22/2017   Procedure: COLONOSCOPY WITH PROPOFOL;  Surgeon: Jonathon Bellows, MD;  Location: [facility] ENDOSCOPY;  Service: Gastroenterology;  Laterality: N/A;   ESOPHAGOGASTRODUODENOSCOPY     ESOPHAGOGASTRODUODENOSCOPY (EGD) WITH PROPOFOL N/A 03/23/2016   Procedure: ESOPHAGOGASTRODUODENOSCOPY (EGD) WITH PROPOFOL;  Surgeon: Manya Silvas, MD;  Location: [facility] ENDOSCOPY;  Service: Endoscopy;  Laterality: N/A;   FLEXIBLE SIGMOIDOSCOPY N/A 02/06/2018   Procedure: FLEXIBLE SIGMOIDOSCOPY;  Surgeon: Jonathon Bellows, MD;  Location: [facility] ENDOSCOPY;  Service: Gastroenterology;  Laterality: N/A;   FLEXIBLE SIGMOIDOSCOPY N/A 12/03/2018   Procedure: FLEXIBLE SIGMOIDOSCOPY;  Surgeon: Jonathon Bellows, MD;  Location: [facility] ENDOSCOPY;  Service: Gastroenterology;  Laterality: N/A;   RECTAL PROLAPSE REPAIR  2012   x2    Family History  Problem Relation Age of Onset   Stroke Mother    Arthritis Mother    Aortic aneurysm Sister    Lung cancer Sister    Heart attack Father 4   Breast cancer Sister 1    SOCIAL HX: Non-smoker  Current Outpatient Medications:    amLODipine (NORVASC) 5 MG tablet, TAKE 1 TABLET DAILY, Disp: 90 tablet, Rfl: 3   aspirin EC 81 MG tablet, Take 81 mg by mouth daily., Disp: , Rfl:    atorvastatin (LIPITOR) 80 MG tablet, Take 1 tablet (80 mg total) by mouth daily., Disp: 90 tablet, Rfl: 3   BD PEN NEEDLE NANO 2ND GEN 32G  X 4 MM MISC, USE DAILY WITH VICTOZA AND BASAGLAR, Disp: 180 each, Rfl: 0   calcitRIOL (ROCALTROL) 0.25 MCG capsule, Take 0.25 mcg by mouth daily., Disp: , Rfl:    Cholecalciferol (VITAMIN D) 50 MCG (2000 UT) CAPS, Take 2,000 Units by mouth daily. , Disp: , Rfl:    Continuous Blood Gluc Sensor (FREESTYLE LIBRE 2 SENSOR) MISC, USE TO CHECK SUGAR AT LEAST4 TIMES A DAY, Disp: 6 each, Rfl: 3   cyclobenzaprine (FLEXERIL) 10 MG tablet, TAKE 1/2 TABLET ('5MG'$  TOTAL)3 TIMES A DAY AS NEEDED    FOR MUSCLE SPASMS, Disp: 45 tablet, Rfl: 0   denosumab (PROLIA) 60 MG/ML SOSY injection, Inject 60 mg into the skin every 6 (six) months., Disp: , Rfl:    doxycycline (VIBRAMYCIN) 100 MG capsule, Take 1 capsule (100 mg total) by mouth 2 (two) times daily for 7 days., Disp: 14 capsule, Rfl: 0   furosemide (LASIX) 40 MG tablet, Take 40 mg by mouth daily as needed., Disp: , Rfl:    gabapentin (NEURONTIN) 100 MG capsule, Take 1 capsule (100 mg total) by mouth 3 (three) times daily., Disp: 180 capsule, Rfl: 1   glucosamine-chondroitin 500-400 MG tablet, Take 1 tablet by mouth 3 (three) times daily., Disp: , Rfl:    glucose blood (ACCU-CHEK AVIVA PLUS) test strip, Use to check blood sugar up to 4 times daily, Disp: 100 each, Rfl: 3   HYDROcodone-acetaminophen (NORCO/VICODIN) 5-325 MG tablet, Take 1 tablet by mouth every 4 (four) hours as needed., Disp: , Rfl:    Insulin Glargine (BASAGLAR KWIKPEN) 100 UNIT/ML, INJECT 20 UNITS TOTAL      SUBCUTANEOUSLY DAILY., Disp: 15 mL, Rfl: 0   Lancets MISC, Use up to 4 times daily to check blood sugars., Disp: 200 each, Rfl: 11   losartan (COZAAR) 50 MG tablet, TAKE 1 TABLET DAILY, Disp: 90 tablet, Rfl: 1   Melatonin 5 MG CHEW, Chew 5 mg by mouth daily., Disp: , Rfl:    potassium chloride (K-DUR) 10 MEQ tablet, Take 10 mEq by mouth daily as needed., Disp: , Rfl:    sertraline (ZOLOFT) 50 MG tablet, Take 1 tablet (50 mg total) by mouth daily., Disp: 90 tablet, Rfl: 2   liraglutide (VICTOZA)  18 MG/3ML SOPN, Inject 1.8 mg into the skin daily., Disp: 27 mL, Rfl: 1  Current Facility-Administered Medications:    denosumab (PROLIA) injection 60 mg, 60 mg, Subcutaneous, Q6 months, [name], MD, 60 mg at 04/05/21 1307  EXAM: This was a telephone visit and thus no exam was completed.  ASSESSMENT AND PLAN:  Discussed the following assessment and plan:  Problem List Items Addressed This Visit     Type 2 diabetes mellitus ([place]) (Chronic)    Poorly controlled.  We will increase her Victoza to 1.8 mg daily.  She will continue Basaglar 8 units daily.  Follow-up in 3 months.  Labs reviewed with patient.       Relevant Medications   liraglutide (VICTOZA) 18 MG/3ML SOPN   Hyperlipidemia   Hypertension - Primary   Mixed anxiety and depressive disorder  Patient reports her anxiety is better.  She will continue Zoloft 50 mg daily.       Skin tear of lower leg without complication, initial encounter    Patient reports this is healing well.  She will complete her doxycycline.  She was advised to seek medical attention over the weekend if she has any recurrent infectious symptoms after she comes off of the doxycycline.  She reports that she has an appointment for direct visualization of this next week.        Return in about 3 months (around 01/01/2022).   I discussed the assessment and treatment plan with the patient. The patient was provided an opportunity to ask questions and all were answered. The patient agreed with the plan and demonstrated an understanding of the instructions.   The patient was advised to call back or seek an in-person evaluation if the symptoms worsen or if the condition fails to improve as anticipated.  I provided 11 minutes of non-face-to-face time during this encounter.   Tommi Rumps, MD

## 2021-10-01 NOTE — Assessment & Plan Note (Addendum)
Patient reports this is healing well.  She will complete her doxycycline.  She was advised to seek medical attention over the weekend if she has any recurrent infectious symptoms after she comes off of the doxycycline.  She reports that she has an appointment for direct visualization of this next week.

## 2021-10-05 ENCOUNTER — Ambulatory Visit (INDEPENDENT_AMBULATORY_CARE_PROVIDER_SITE_OTHER): Payer: Medicare Other | Admitting: Internal Medicine

## 2021-10-05 ENCOUNTER — Encounter: Payer: Self-pay | Admitting: Internal Medicine

## 2021-10-05 VITALS — BP 130/70 | HR 59 | Temp 97.8°F | Resp 14 | Ht 63.0 in | Wt 122.4 lb

## 2021-10-05 DIAGNOSIS — S81801D Unspecified open wound, right lower leg, subsequent encounter: Secondary | ICD-10-CM

## 2021-10-05 DIAGNOSIS — M810 Age-related osteoporosis without current pathological fracture: Secondary | ICD-10-CM

## 2021-10-05 MED ORDER — MUPIROCIN 2 % EX OINT: 1.0000 "application " | TOPICAL_OINTMENT | Freq: Two times a day (BID) | CUTANEOUS | 0 refills | Status: DC

## 2021-10-05 MED ORDER — DENOSUMAB 60 MG/ML ~~LOC~~ SOSY
60.0000 mg | PREFILLED_SYRINGE | Freq: Once | SUBCUTANEOUS | Status: AC
  Administered 2021-10-05: 60 mg via SUBCUTANEOUS

## 2021-10-05 MED ORDER — DOXYCYCLINE HYCLATE 100 MG PO TABS: 100.0000 mg | ORAL_TABLET | Freq: Two times a day (BID) | ORAL | 0 refills | Status: DC

## 2021-10-05 NOTE — Patient Instructions (Addendum)
Bactroban 2-3 x per day right lower leg wound  Doxycycline 2x per day another week with food    Antibacterial soaps Dove antibacterial, chlorhexadine or Hibiclens   Bactine max antiseptic spray [facility]    Wound Care, Adult Taking care of your wound properly can help to prevent pain, infection, and scarring. It can also help your wound heal more quickly. Follow instructions from your health care provider about how to care for your wound. Supplies needed: Soap and water. Wound cleanser, saline, or germ-free (sterile) water. Gauze. If needed, a clean bandage (dressing) or other type of wound dressing material to cover or place in the wound. Follow your health care provider's instructions about what dressing supplies to use. Cream or topical ointment to apply to the wound, if told by your health care provider. How to care for your wound Cleaning the wound Ask your health care provider how to clean the wound. This may include: Using mild soap and water, a wound cleanser, saline, or sterile water. Using a clean gauze to pat the wound dry after cleaning it. Do not rub or scrub the wound. Dressing care Wash your hands with soap and water for at least 20 seconds before and after you change the dressing. If soap and water are not available, use hand sanitizer. Change your dressing as told by your health care provider. This may include: Cleaning or rinsing out (irrigating) the wound. Application of cream or topical ointment, if told by your health care provider. Placing a dressing over the wound or in the wound (packing). Covering the wound with an outer dressing. Leave stitches (sutures), staples, skin glue, or adhesive strips in place. These skin closures may need to stay in place for 2 weeks or longer. If adhesive strip edges start to loosen and curl up, you may trim the loose edges. Do not remove adhesive strips completely unless your health care provider tells you to do that. Ask your  health care provider when you can leave the wound uncovered. Checking for infection Check your wound area every day for signs of infection. Check for: More redness, swelling, or pain. Fluid or blood. Warmth. Pus or a bad smell.  Follow these instructions at home Medicines If you were prescribed an antibiotic medicine, cream, or ointment, take or apply it as told by your health care provider. Do not stop using the antibiotic even if your condition improves. If you were prescribed pain medicine, take it 30 minutes before you do any wound care or as told by your health care provider. Take over-the-counter and prescription medicines only as told by your health care provider. Eating and drinking Eat a diet that includes protein, vitamin A, vitamin C, and other nutrient-rich foods to help the wound heal. Foods rich in protein include meat, fish, eggs, dairy, beans, and nuts. Foods rich in vitamin A include carrots and dark green, leafy vegetables. Foods rich in vitamin C include citrus fruits, tomatoes, broccoli, and peppers. Drink enough fluid to keep your urine pale yellow. General instructions Do not take baths, swim, or use a hot tub until your health care provider approves. Ask your health care provider if you may take showers. You may only be allowed to take sponge baths. Do not scratch or pick at the wound. Keep it covered as told by your health care provider. Return to your normal activities as told by your health care provider. Ask your health care provider what activities are safe for you. Protect your wound from the sun  when you are outside for the first 6 months, or for as long as told by your health care provider. Cover up the scar area or apply sunscreen that has an SPF of at least 63. Do not use any products that contain nicotine or tobacco. These products include cigarettes, chewing tobacco, and vaping devices, such as e-cigarettes. If you need help quitting, ask your health care  provider. Keep all follow-up visits. This is important. Contact a health care provider if: You received a tetanus shot and you have swelling, severe pain, redness, or bleeding at the injection site. Your pain is not controlled with medicine. You have any of these signs of infection: More redness, swelling, or pain around the wound. Fluid or blood coming from the wound. Warmth coming from the wound. A fever or chills. You are nauseous or you vomit. You are dizzy. You have a new rash or hardness around the wound. Get help right away if: You have a red streak of skin near the area around your wound. Pus or a bad smell coming from the wound. Your wound has been closed with staples, sutures, skin glue, or adhesive strips and it begins to open up and separate. Your wound is bleeding, and the bleeding does not stop with gentle pressure. These symptoms may represent a serious problem that is an emergency. Do not wait to see if the symptoms will go away. Get medical help right away. Call your local emergency services (911 in the [place] Do not drive yourself to the hospital. Summary Always wash your hands with soap and water for at least 20 seconds before and after changing your dressing. Change your dressing as told by your health care provider. To help with healing, eat foods that are rich in protein, vitamin A, vitamin C, and other nutrients. Check your wound every day for signs of infection. Contact your health care provider if you think that your wound is infected. This information is not intended to replace advice given to you by your health care provider. Make sure you discuss any questions you have with your health care provider. Document Revised: 09/01/2020 Document Reviewed: 09/01/2020 Elsevier Patient Education  [place].

## 2021-10-05 NOTE — Progress Notes (Signed)
Chief Complaint  Patient presents with   Follow-up    [facility] 09/27/21, for wound. Pt states wound is getting better and finished course of abx denies any pain currently.    F/u  1. Right lower leg wound after cleaning cement bird water bowl and bowel hit her leg mid 09/2021 she completed doxycycline bid x 1 week and tried otc oint area is tender looks some better  Td utd 2020   2. 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 Osteoporosis given prolia shot today  Review of Systems  Constitutional:  Negative for weight loss.  HENT:  Negative for hearing loss.   Eyes:  Negative for blurred vision.  Respiratory:  Negative for shortness of breath.   Cardiovascular:  Negative for chest pain.  Gastrointestinal:  Negative for abdominal pain and blood in stool.  Genitourinary:  Negative for dysuria.  Musculoskeletal:  Negative for falls and joint pain.  Skin:  Negative for rash.  Neurological:  Negative for headaches.  Psychiatric/Behavioral:  Negative for depression.   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    Past Surgical History:  Procedure Laterality Date   APPENDECTOMY     BREAST BIOPSY Right 06/19/2018   affirm stereo/x clip/COLUMNAR CELL CHANGE WITH MICROCALCIFICATIONS. FIBROCYSTIC CHANGES WITH MICROCALCIFICATIONS   cataract  03/2017   CHOLECYSTECTOMY     COLONOSCOPY     COLONOSCOPY WITH PROPOFOL N/A 08/22/2017   Procedure: COLONOSCOPY WITH PROPOFOL;  Surgeon: Jonathon Bellows, MD;  Location[facility] ENDOSCOPY;  Service: Gastroenterology;  Laterality: N/A;   ESOPHAGOGASTRODUODENOSCOPY     ESOPHAGOGASTRODUODENOSCOPY (EGD) WITH PROPOFOL N/A 03/23/2016   Procedure: ESOPHAGOGASTRODUODENOSCOPY (EGD) WITH  PROPOFOL;  Surgeon: Manya Silvas, MD;  Location: [facility] ENDOSCOPY;  Service: Endoscopy;  Laterality: N/A;   FLEXIBLE SIGMOIDOSCOPY N/A 02/06/2018   Procedure: FLEXIBLE SIGMOIDOSCOPY;  Surgeon: Jonathon Bellows, MD;  Location: [facility] ENDOSCOPY;  Service: Gastroenterology;  Laterality: N/A;   FLEXIBLE SIGMOIDOSCOPY N/A 12/03/2018   Procedure: FLEXIBLE SIGMOIDOSCOPY;  Surgeon: Jonathon Bellows, MD;  Location: [facility] ENDOSCOPY;  Service: Gastroenterology;  Laterality: N/A;   RECTAL PROLAPSE REPAIR  2012   x2   Family History  Problem Relation Age of Onset   Stroke Mother    Arthritis Mother    Aortic aneurysm Sister    Lung cancer Sister    Heart attack Father 49   Breast cancer Sister 28   Social History   Socioeconomic History   Marital status: Widowed    Spouse name: Not on file   Number of children: Not on file   Years of education: Not on file   Highest education level: High school graduate  Occupational History   Occupation: retired   Tobacco Use   Smoking status: Never   Smokeless tobacco: Never  Vaping Use   Vaping Use: Never used  Substance and Sexual Activity   Alcohol use: No   Drug use: No   Sexual activity: Not Currently    Partners: Male  Other Topics Concern   Not on file  Social History Narrative   Not on file   Social Determinants of Health  Financial Resource Strain: Low Risk    Difficulty of Paying Living Expenses: Not hard at all  Food Insecurity: No Food Insecurity   Worried About Charity fundraiser in the Last Year: Never true   Ran Out of Food in the Last Year: Never true  Transportation Needs: No Transportation Needs   Lack of Transportation (Medical): No   Lack of Transportation (Non-Medical): No  Physical Activity: Insufficiently Active   Days of Exercise per Week: 3 days   Minutes of Exercise per Session: 20 min  Stress: No Stress Concern Present   Feeling of Stress : Only a little  Social Connections: Unknown   Frequency of Communication with  Friends and Family: More than three times a week   Frequency of Social Gatherings with Friends and Family: Not on file   Attends Religious Services: Not on Electrical engineer or Organizations: Not on file   Attends Archivist Meetings: Not on file   Marital Status: Not on file  Intimate Partner Violence: Not At Risk   Fear of Current or Ex-Partner: No   Emotionally Abused: No   Physically Abused: No   Sexually Abused: No   Current Meds  Medication Sig   amLODipine (NORVASC) 5 MG tablet TAKE 1 TABLET DAILY   aspirin EC 81 MG tablet Take 81 mg by mouth daily.   atorvastatin (LIPITOR) 80 MG tablet Take 1 tablet (80 mg total) by mouth daily.   BD PEN NEEDLE NANO 2ND GEN 32G X 4 MM MISC USE DAILY WITH VICTOZA AND BASAGLAR   calcitRIOL (ROCALTROL) 0.25 MCG capsule Take 0.25 mcg by mouth daily.   Cholecalciferol (VITAMIN D) 50 MCG (2000 UT) CAPS Take 2,000 Units by mouth daily.    Continuous Blood Gluc Sensor (FREESTYLE LIBRE 2 SENSOR) MISC USE TO CHECK SUGAR AT LEAST4 TIMES A DAY   cyclobenzaprine (FLEXERIL) 10 MG tablet TAKE 1/2 TABLET (5MG TOTAL)3 TIMES A DAY AS NEEDED    FOR MUSCLE SPASMS   denosumab (PROLIA) 60 MG/ML SOSY injection Inject 60 mg into the skin every 6 (six) months.   doxycycline (VIBRA-TABS) 100 MG tablet Take 1 tablet (100 mg total) by mouth 2 (two) times daily. With food   furosemide (LASIX) 40 MG tablet Take 40 mg by mouth daily as needed.   gabapentin (NEURONTIN) 100 MG capsule Take 1 capsule (100 mg total) by mouth 3 (three) times daily.   glucosamine-chondroitin 500-400 MG tablet Take 1 tablet by mouth 3 (three) times daily.   glucose blood (ACCU-CHEK AVIVA PLUS) test strip Use to check blood sugar up to 4 times daily   HYDROcodone-acetaminophen (NORCO/VICODIN) 5-325 MG tablet Take 1 tablet by mouth every 4 (four) hours as needed.   Insulin Glargine (BASAGLAR KWIKPEN) 100 UNIT/ML INJECT 20 UNITS TOTAL      SUBCUTANEOUSLY DAILY.   Lancets MISC Use  up to 4 times daily to check blood sugars.   liraglutide (VICTOZA) 18 MG/3ML SOPN Inject 1.8 mg into the skin daily.   losartan (COZAAR) 50 MG tablet TAKE 1 TABLET DAILY   Melatonin 5 MG CHEW Chew 5 mg by mouth daily.   mupirocin ointment (BACTROBAN) 2 % Apply 1 application. topically 2 (two) times daily. Right leg wound   potassium chloride (K-DUR) 10 MEQ tablet Take 10 mEq by mouth daily as needed.   sertraline (ZOLOFT) 50 MG tablet Take 1 tablet (50 mg total) by mouth daily.   Current Facility-Administered Medications for the 10/05/21 encounter (  Office Visit) with McLean-Scocuzza, Nino Glow, MD  Medication   denosumab (PROLIA) injection 60 mg   denosumab (PROLIA) injection 60 mg   Allergies  Allergen Reactions   Prilosec Otc [Omeprazole Magnesium] Other (See Comments)    Maybe cause of acute interstitial nephritis   Sucralfate Other (See Comments)    Maybe cause of acute interstitial nephritis   Amoxicillin Diarrhea   Morphine And Related Other (See Comments)    Chest pains Chest pains   Recent Results (from the past 2160 hour(s))  HgB A1c     Status: Abnormal   Collection Time: 09/29/21 11:28 AM  Result Value Ref Range   Hgb A1c MFr Bld 8.5 (H) 4.6 - 6.5 %    Comment: Glycemic Control Guidelines for People with Diabetes:Non Diabetic:  <6%Goal of Therapy: <7%Additional Action Suggested:  >8%   Lipid panel     Status: None   Collection Time: 09/29/21 11:28 AM  Result Value Ref Range   Cholesterol 114 0 - 200 mg/dL    Comment: ATP III Classification       Desirable:  < 200 mg/dL               Borderline High:  200 - 239 mg/dL          High:  > = 240 mg/dL   Triglycerides 86.0 0.0 - 149.0 mg/dL    Comment: Normal:  <150 mg/dLBorderline High:  150 - 199 mg/dL   HDL 39.90 >39.00 mg/dL   VLDL 17.2 0.0 - 40.0 mg/dL   LDL Cholesterol 57 0 - 99 mg/dL   Total CHOL/HDL Ratio 3     Comment:                Men          Women1/2 Average Risk     3.4          3.3Average Risk          5.0           4.42X Average Risk          9.6          7.13X Average Risk          15.0          11.0                       NonHDL 74.06     Comment: NOTE:  Non-HDL goal should be 30 mg/dL higher than patient's LDL goal (i.e. LDL goal of < 70 mg/dL, would have non-HDL goal of < 100 mg/dL)  Comp Met (CMET)     Status: Abnormal   Collection Time: 09/29/21 11:28 AM  Result Value Ref Range   Sodium 144 135 - 145 mEq/L   Potassium 4.0 3.5 - 5.1 mEq/L   Chloride 106 96 - 112 mEq/L   CO2 32 19 - 32 mEq/L   Glucose, Bld 186 (H) 70 - 99 mg/dL   BUN 20 6 - 23 mg/dL   Creatinine, Ser 1.84 (H) 0.40 - 1.20 mg/dL   Total Bilirubin 0.7 0.2 - 1.2 mg/dL   Alkaline Phosphatase 94 39 - 117 U/L   AST 16 0 - 37 U/L   ALT 13 0 - 35 U/L   Total Protein 6.0 6.0 - 8.3 g/dL   Albumin 3.9 3.5 - 5.2 g/dL   GFR 25.63 (L) >60.00 mL/min    Comment: Calculated using the CKD-EPI Creatinine Equation (  2021)   Calcium 9.8 8.4 - 10.5 mg/dL   Objective  Body mass index is 21.68 kg/m. Wt Readings from Last 3 Encounters:  10/05/21 122 lb 6.4 oz (55.5 kg)  10/01/21 123 lb (55.8 kg)  08/30/21 121 lb (54.9 kg)   Temp Readings from Last 3 Encounters:  10/05/21 97.8 F (36.6 C) (Oral)  09/27/21 98.1 F (36.7 C) (Oral)  09/24/21 97.9 F (36.6 C)   BP Readings from Last 3 Encounters:  10/05/21 130/70  09/27/21 (!) 141/69  09/24/21 (!) 150/63   Pulse Readings from Last 3 Encounters:  10/05/21 (!) 59  09/27/21 62  09/24/21 72    Physical Exam Vitals and nursing note reviewed.  Constitutional:      Appearance: Normal appearance. She is well-developed and well-groomed.  HENT:     Head: Normocephalic and atraumatic.  Eyes:     Conjunctiva/sclera: Conjunctivae normal.     Pupils: Pupils are equal, round, and reactive to light.  Cardiovascular:     Rate and Rhythm: Normal rate and regular rhythm.     Heart sounds: Normal heart sounds. No murmur heard. Pulmonary:     Effort: Pulmonary effort is normal.     Breath  sounds: Normal breath sounds.  Abdominal:     General: Abdomen is flat. Bowel sounds are normal.     Tenderness: There is no abdominal tenderness.  Musculoskeletal:        General: No tenderness.  Skin:    General: Skin is warm and dry.       Neurological:     General: No focal deficit present.     Mental Status: She is alert and oriented to person, place, and time. Mental status is at baseline.     Cranial Nerves: Cranial nerves 2-12 are intact.     Motor: Motor function is intact.     Coordination: Coordination is intact.     Gait: Gait is intact.  Psychiatric:        Attention and Perception: Attention and perception normal.        Mood and Affect: Mood and affect normal.        Speech: Speech normal.        Behavior: Behavior normal. Behavior is cooperative.        Thought Content: Thought content normal.        Cognition and Memory: Cognition and memory normal.        Judgment: Judgment normal.    Assessment  Plan  Open wound of right lower leg, subsequent encounter - Plan: mupirocin ointment (BACTROBAN) 2 % bid to tid, doxycycline (VIBRA-TABS) 100 MG  bid x 1 week   Age-related osteoporosis without current pathological fracture - Plan: denosumab (PROLIA) injection 60 mg  Another in 6 months   DM 2 f/u with PCP in 3 months A1c 8.5 Provider: Dr. Olivia Mackie McLean-Scocuzza-Internal Medicine

## 2021-10-06 ENCOUNTER — Ambulatory Visit: Payer: Medicare Other

## 2021-10-12 DIAGNOSIS — Z5181 Encounter for therapeutic drug level monitoring: Secondary | ICD-10-CM | POA: Diagnosis not present

## 2021-10-12 DIAGNOSIS — Q762 Congenital spondylolisthesis: Secondary | ICD-10-CM | POA: Diagnosis not present

## 2021-10-12 DIAGNOSIS — T2101XA Burn of unspecified degree of chest wall, initial encounter: Secondary | ICD-10-CM | POA: Diagnosis not present

## 2021-10-12 DIAGNOSIS — M542 Cervicalgia: Secondary | ICD-10-CM | POA: Diagnosis not present

## 2021-10-12 DIAGNOSIS — M5136 Other intervertebral disc degeneration, lumbar region: Secondary | ICD-10-CM | POA: Diagnosis not present

## 2021-10-12 DIAGNOSIS — M7062 Trochanteric bursitis, left hip: Secondary | ICD-10-CM | POA: Diagnosis not present

## 2021-10-12 DIAGNOSIS — M25559 Pain in unspecified hip: Secondary | ICD-10-CM | POA: Diagnosis not present

## 2021-10-12 DIAGNOSIS — M25519 Pain in unspecified shoulder: Secondary | ICD-10-CM | POA: Diagnosis not present

## 2021-10-12 DIAGNOSIS — M6281 Muscle weakness (generalized): Secondary | ICD-10-CM | POA: Diagnosis not present

## 2021-10-12 DIAGNOSIS — M545 Low back pain, unspecified: Secondary | ICD-10-CM | POA: Diagnosis not present

## 2021-10-12 DIAGNOSIS — M5032 Other cervical disc degeneration, mid-cervical region, unspecified level: Secondary | ICD-10-CM | POA: Diagnosis not present

## 2021-10-12 DIAGNOSIS — M25572 Pain in left ankle and joints of left foot: Secondary | ICD-10-CM | POA: Diagnosis not present

## 2021-10-12 DIAGNOSIS — E1165 Type 2 diabetes mellitus with hyperglycemia: Secondary | ICD-10-CM | POA: Diagnosis not present

## 2021-10-12 DIAGNOSIS — Z79891 Long term (current) use of opiate analgesic: Secondary | ICD-10-CM | POA: Diagnosis not present

## 2021-10-15 ENCOUNTER — Telehealth: Payer: Self-pay | Admitting: Family Medicine

## 2021-10-15 MED ORDER — CLINDAMYCIN HCL 300 MG PO CAPS
300.0000 mg | ORAL_CAPSULE | Freq: Two times a day (BID) | ORAL | 0 refills | Status: DC
Start: 2021-10-15 — End: 2022-01-05

## 2021-10-15 NOTE — Addendum Note (Signed)
Addended by: Orland Mustard on: 10/15/2021 05:00 PM   Modules accepted: Orders

## 2021-10-15 NOTE — Telephone Encounter (Signed)
Pt daughter called stating pt saw [facility] on the 30th for her leg and was prescribed antibiotics but pt need more because the leg is red, sore and bumpy. Pt daughter would like to be called [phone]

## 2021-10-28 ENCOUNTER — Encounter: Payer: Medicare Other | Attending: Physician Assistant | Admitting: Physician Assistant

## 2021-10-28 DIAGNOSIS — I87331 Chronic venous hypertension (idiopathic) with ulcer and inflammation of right lower extremity: Secondary | ICD-10-CM | POA: Insufficient documentation

## 2021-10-28 DIAGNOSIS — N184 Chronic kidney disease, stage 4 (severe): Secondary | ICD-10-CM | POA: Diagnosis not present

## 2021-10-28 DIAGNOSIS — E11622 Type 2 diabetes mellitus with other skin ulcer: Secondary | ICD-10-CM | POA: Insufficient documentation

## 2021-10-28 DIAGNOSIS — I872 Venous insufficiency (chronic) (peripheral): Secondary | ICD-10-CM | POA: Diagnosis not present

## 2021-10-28 DIAGNOSIS — I129 Hypertensive chronic kidney disease with stage 1 through stage 4 chronic kidney disease, or unspecified chronic kidney disease: Secondary | ICD-10-CM | POA: Diagnosis not present

## 2021-10-28 DIAGNOSIS — I10 Essential (primary) hypertension: Secondary | ICD-10-CM | POA: Diagnosis not present

## 2021-10-28 DIAGNOSIS — L97812 Non-pressure chronic ulcer of other part of right lower leg with fat layer exposed: Secondary | ICD-10-CM | POA: Insufficient documentation

## 2021-10-28 DIAGNOSIS — E1122 Type 2 diabetes mellitus with diabetic chronic kidney disease: Secondary | ICD-10-CM | POA: Diagnosis not present

## 2021-11-01 ENCOUNTER — Other Ambulatory Visit: Payer: Self-pay | Admitting: Family Medicine

## 2021-11-01 ENCOUNTER — Ambulatory Visit (INDEPENDENT_AMBULATORY_CARE_PROVIDER_SITE_OTHER): Payer: Medicare Other

## 2021-11-01 ENCOUNTER — Ambulatory Visit
Admission: EM | Admit: 2021-11-01 | Discharge: 2021-11-01 | Disposition: A | Discharge status: Home / Self Care | Payer: Medicare Other | Attending: Emergency Medicine | Admitting: Emergency Medicine

## 2021-11-01 DIAGNOSIS — M25571 Pain in right ankle and joints of right foot: Secondary | ICD-10-CM | POA: Diagnosis not present

## 2021-11-01 DIAGNOSIS — T1490XD Injury, unspecified, subsequent encounter: Secondary | ICD-10-CM

## 2021-11-01 NOTE — ED Triage Notes (Signed)
Pt presents with complaints of thinking she may have gout in her right ankle. Reports pain x 2 days that is worsening. Denies any injury.

## 2021-11-03 ENCOUNTER — Telehealth: Payer: Self-pay

## 2021-11-03 NOTE — Telephone Encounter (Signed)
Patient's daughter, Sarabelle Genson, called to state patient's feet have been swelling for a little over a week.  Marcie Bal states they are red and when she took her sandals off, she could see the imprint of the sandals on her foot.  Oneal Biglow states that patient went the the Urgent Care (ED note on file in [place]) on 11/01/2021 for pain in her ankle.  Marcie Bal states the provider they saw told patient to see her PCP soon.  Marcie Bal states patient is currently seeing Wound Care for a wound on her right leg.  Zoraya Fiorenza also states Dr. Tommi Rumps recently increased patient's liraglutide ([place]) 18 MG/3ML SOPN and patient's blood sugar has dropped a few times since then.  Marcie Bal states patient's blood sugar was 58 last night, four hours after eating an egg salad sandwich with two slices of bread and drinking some Pepsi.  Call was transferred to Access Nurse.

## 2021-11-03 NOTE — Telephone Encounter (Signed)
[name] called from Access Nurse to state recommendation is for patient to be seen in 3-4 hours.  I let her know that we do not have any available appointments at this time.  Nira Conn stated that she will send patient to Urgent Care.  Nira Conn also requested our office call patient to address the blood sugar issue.

## 2021-11-03 NOTE — Telephone Encounter (Signed)
[name] called back from Access Nurse to state patient would like to have an appointment with our office in the coming week or so.  I spoke with patient and scheduled her to see Dr. Olivia Mackie McLean-Scocuzza on 11/11/2021.

## 2021-11-03 NOTE — Telephone Encounter (Signed)
Noted. She really should be seen for her legs prior to next week. Urgent care at [facility] would be the best option. If she is having low sugars I would suggest she go back to 1.2 mg of victoza.

## 2021-11-03 NOTE — Telephone Encounter (Signed)
Could not leave a VM . Elandra Powell,cma

## 2021-11-04 ENCOUNTER — Encounter: Payer: Medicare Other | Admitting: Physician Assistant

## 2021-11-04 DIAGNOSIS — E11622 Type 2 diabetes mellitus with other skin ulcer: Secondary | ICD-10-CM | POA: Diagnosis not present

## 2021-11-04 DIAGNOSIS — N184 Chronic kidney disease, stage 4 (severe): Secondary | ICD-10-CM | POA: Diagnosis not present

## 2021-11-04 DIAGNOSIS — E1122 Type 2 diabetes mellitus with diabetic chronic kidney disease: Secondary | ICD-10-CM | POA: Diagnosis not present

## 2021-11-04 DIAGNOSIS — I87331 Chronic venous hypertension (idiopathic) with ulcer and inflammation of right lower extremity: Secondary | ICD-10-CM | POA: Diagnosis not present

## 2021-11-04 DIAGNOSIS — L97812 Non-pressure chronic ulcer of other part of right lower leg with fat layer exposed: Secondary | ICD-10-CM | POA: Diagnosis not present

## 2021-11-04 DIAGNOSIS — I129 Hypertensive chronic kidney disease with stage 1 through stage 4 chronic kidney disease, or unspecified chronic kidney disease: Secondary | ICD-10-CM | POA: Diagnosis not present

## 2021-11-04 NOTE — Progress Notes (Addendum)
ADALEEN, HULGAN ([id number]) Visit Report for 11/04/2021 Chief Complaint Document Details Patient Name: Kelly [name], Kelly [name]. Date of Service: 11/04/2021 10:45 AM Medical Record Number: [id number] Patient Account Number: [id number] Date of Birth/Sex: 01/30/1941 (81 y.o. F) Treating RN: Primary Care Provider: Tommi Rumps Other Clinician: Referring Provider: Tommi Rumps Treating Provider/Extender: Skipper Cliche in Treatment: 1 Information Obtained from: Patient Chief Complaint Right LE Ulcer Electronic Signature(s) Signed: 11/04/2021 11:13:04 AM By: Worthy Keeler PA-C Entered By: Worthy Keeler on 11/04/2021 11:13:04 Bechard, Kelly Pointer ([id number]) -------------------------------------------------------------------------------- HPI Details Patient Name: Kelly [name] Date of Service: 11/04/2021 10:45 AM Medical Record Number: [id number] Patient Account Number: [id number] Date of Birth/Sex: 04-Jun-1940 (81 y.o. F) Treating RN: Primary Care Provider: Tommi Rumps Other Clinician: Referring Provider: Tommi Rumps Treating Provider/Extender: Skipper Cliche in Treatment: 1 History of Present Illness HPI Description: 10-28-2021 upon evaluation today patient appears to be doing somewhat poorly in regard to a wound on her right leg. This happened when she had a concrete birdbath fall and hit her leg on May 16. She went to urgent care 2 days later since that time she has been on 2 rounds of doxycycline as well as a round of clindamycin the good news is I see no evidence of infection at this point. Fortunately there is no signs of active infection locally or systemically which is great news and overall I think you are on the right track as far as healing is concerned I do believe that there are some things we can do to help her out here. The patient does have a history of diabetes mellitus type 2, hypertension, chronic kidney disease stage IV, and  chronic venous insufficiency/hypertension. Her most recent hemoglobin A1c was 8.5 on 09-29-2021. 11-04-2021 upon evaluation today patient appears to be doing well currently in regard to her wound. She has been tolerating the dressing changes without complication. Fortunately I do not see any evidence of infection locally or systemically which is great news. Electronic Signature(s) Signed: 11/04/2021 11:37:48 AM By: Worthy Keeler PA-C Entered By: Worthy Keeler on 11/04/2021 11:37:48 Maiello, Kelly Pointer ([id number]) -------------------------------------------------------------------------------- Physical Exam Details Patient Name: Kelly [name] Date of Service: 11/04/2021 10:45 AM Medical Record Number: [id number] Patient Account Number: [id number] Date of Birth/Sex: 1941/03/25 (81 y.o. F) Treating RN: Primary Care Provider: Tommi Rumps Other Clinician: Referring Provider: Tommi Rumps Treating Provider/Extender: Skipper Cliche in Treatment: 1 Constitutional Well-nourished and well-hydrated in no acute distress. Respiratory normal breathing without difficulty. Psychiatric this patient is able to make decisions and demonstrates good insight into disease process. Alert and Oriented x 3. pleasant and cooperative. Notes Upon inspection patient's wound bed actually showed signs of good granulation and epithelization at this point. Fortunately I do not see any evidence of infection locally at this time which is great and overall her wound is very small I think were very close to complete resolution. Electronic Signature(s) Signed: 11/04/2021 11:38:05 AM By: Worthy Keeler PA-C Entered By: Worthy Keeler on 11/04/2021 11:38:04 Kelly [name] ([id number]) -------------------------------------------------------------------------------- Physician Orders Details Patient Name: Kelly [name] Date of Service: 11/04/2021 10:45 AM Medical Record Number:  [id number] Patient Account Number: [id number] Date of Birth/Sex: Mar 15, 1941 (81 y.o. F) Treating RN: Kelly [name] Primary Care Provider: Tommi Rumps Other Clinician: Massie Kluver Referring Provider: Tommi Rumps Treating Provider/Extender: Skipper Cliche in Treatment: 1 Verbal / Phone Orders: No Diagnosis Coding ICD-10 Coding Code Description E11.622 Type 2 diabetes mellitus with other skin ulcer L97.812 Non-pressure  chronic ulcer of other part of right lower leg with fat layer exposed I10 Essential (primary) hypertension N18.4 Chronic kidney disease, stage 4 (severe) I87.331 Chronic venous hypertension (idiopathic) with ulcer and inflammation of right lower extremity Follow-up Appointments o Return Appointment in 2 weeks. Bathing/ Shower/ Hygiene o May shower; gently cleanse wound with antibacterial soap, rinse and pat dry prior to dressing wounds Anesthetic (Use 'Patient Medications' Section for Anesthetic Order Entry) o Lidocaine applied to wound bed Edema Control - Lymphedema / Segmental Compressive Device / Other o Elevate, Exercise Daily and Avoid Standing for Long Periods of Time. o Elevate legs to the level of the heart and pump ankles as often as possible o Elevate leg(s) parallel to the floor when sitting. Wound Treatment Wound #1 - Lower Leg Wound Laterality: Right, Midline Primary Dressing: Prisma 4.34 (in) 3 x Per Week/15 Days Discharge Instructions: Moisten w/normal saline or sterile water; Cover wound as directed. Do not remove from wound bed. Add-Ons: Curity Flexible Adhesive Bandage, Xlarge, 2x3.25 (in/in) 3 x Per Week/15 Days Discharge Instructions: 2x3.25 bandage Electronic Signature(s) Signed: 11/05/2021 4:25:13 PM By: Massie Kluver Signed: 11/05/2021 4:59:17 PM By: Worthy Keeler PA-C Entered By: Massie Kluver on 11/04/2021 11:42:09 Ebel, Kelly Pointer  ([id number]) -------------------------------------------------------------------------------- Problem List Details Patient Name: Kelly [name], Kelly [name]. Date of Service: 11/04/2021 10:45 AM Medical Record Number: [id number] Patient Account Number: [id number] Date of Birth/Sex: [birth year] (81 y.o. F) Treating RN: Primary Care Provider: Tommi Rumps Other Clinician: Referring Provider: Tommi Rumps Treating Provider/Extender: Skipper Cliche in Treatment: 1 Active Problems ICD-10 Encounter Code Description Active Date MDM Diagnosis E11.622 Type 2 diabetes mellitus with other skin ulcer 10/28/2021 No Yes L97.812 Non-pressure chronic ulcer of other part of right lower leg with fat layer 10/28/2021 No Yes exposed Nile (primary) hypertension 10/28/2021 No Yes N18.4 Chronic kidney disease, stage 4 (severe) 10/28/2021 No Yes I87.331 Chronic venous hypertension (idiopathic) with ulcer and inflammation of 10/28/2021 No Yes right lower extremity Inactive Problems Resolved Problems Electronic Signature(s) Signed: 11/04/2021 11:13:01 AM By: Worthy Keeler PA-C Entered By: Worthy Keeler on 11/04/2021 11:13:01 Vannote, Kelly Pointer ([id number]) -------------------------------------------------------------------------------- Progress Note Details Patient Name: Kelly [name] Date of Service: 11/04/2021 10:45 AM Medical Record Number: [id number] Patient Account Number: [id number] Date of Birth/Sex: [birth year] (81 y.o. F) Treating RN: Primary Care Provider: Tommi Rumps Other Clinician: Referring Provider: Tommi Rumps Treating Provider/Extender: Skipper Cliche in Treatment: 1 Subjective Chief Complaint Information obtained from Patient Right LE Ulcer History of Present Illness (HPI) 10-28-2021 upon evaluation today patient appears to be doing somewhat poorly in regard to a wound on her right leg. This happened when she had a concrete birdbath fall and hit her  leg on May 16. She went to urgent care 2 days later since that time she has been on 2 rounds of doxycycline as well as a round of clindamycin the good news is I see no evidence of infection at this point. Fortunately there is no signs of active infection locally or systemically which is great news and overall I think you are on the right track as far as healing is concerned I do believe that there are some things we can do to help her out here. The patient does have a history of diabetes mellitus type 2, hypertension, chronic kidney disease stage IV, and chronic venous insufficiency/hypertension. Her most recent hemoglobin A1c was 8.5 on 09-29-2021. 11-04-2021 upon evaluation today patient appears to be doing well currently in regard to her wound. She  has been tolerating the dressing changes without complication. Fortunately I do not see any evidence of infection locally or systemically which is great news. Objective Constitutional Well-nourished and well-hydrated in no acute distress. Vitals Time Taken: 10:55 AM, Height: 64 in, Weight: 118 lbs, BMI: 20.3, Temperature: 97.8 F, Pulse: 66 bpm, Respiratory Rate: 18 breaths/min, Blood Pressure: 169/77 mmHg. Respiratory normal breathing without difficulty. Psychiatric this patient is able to make decisions and demonstrates good insight into disease process. Alert and Oriented x 3. pleasant and cooperative. General Notes: Upon inspection patient's wound bed actually showed signs of good granulation and epithelization at this point. Fortunately I do not see any evidence of infection locally at this time which is great and overall her wound is very small I think were very close to complete resolution. Integumentary (Hair, Skin) Wound #1 status is Open. Original cause of wound was Trauma. The date acquired was: 09/21/2021. The wound has been in treatment 1 weeks. The wound is located on the Right,Midline Lower Leg. The wound measures 0.3cm length x 0.3cm  width x 0.2cm depth; 0.071cm^2 area and 0.014cm^3 volume. There is Fat Layer (Subcutaneous Tissue) exposed. There is a medium amount of serous drainage noted. The wound margin is thickened. There is no granulation within the wound bed. There is a medium (34-66%) amount of necrotic tissue within the wound bed including Adherent Slough. Assessment Active Problems ICD-10 Type 2 diabetes mellitus with other skin ulcer Non-pressure chronic ulcer of other part of right lower leg with fat layer exposed Dolle, Kelly. ([id number]) Essential (primary) hypertension Chronic kidney disease, stage 4 (severe) Chronic venous hypertension (idiopathic) with ulcer and inflammation of right lower extremity Plan Follow-up Appointments: Return Appointment in 1 week. Bathing/ Shower/ Hygiene: May shower; gently cleanse wound with antibacterial soap, rinse and pat dry prior to dressing wounds Anesthetic (Use 'Patient Medications' Section for Anesthetic Order Entry): Lidocaine applied to wound bed Edema Control - Lymphedema / Segmental Compressive Device / Other: Elevate, Exercise Daily and Avoid Standing for Long Periods of Time. Elevate legs to the level of the heart and pump ankles as often as possible Elevate leg(s) parallel to the floor when sitting. WOUND #1: - Lower Leg Wound Laterality: Right, Midline Primary Dressing: Prisma 4.34 (in) 3 x Per Week/15 Days Discharge Instructions: Moisten w/normal saline or sterile water; Cover wound as directed. Do not remove from wound bed. Add-Ons: Curity Flexible Adhesive Bandage, Xlarge, 2x3.25 (in/in) 3 x Per Week/15 Days Discharge Instructions: 2x3.25 bandage 1. I Kelly [name] suggest that we go ahead and continue with the wound care measures as before and the patient is in agreement with plan. This includes the use of the silver collagen dressing which I think is doing a good job. 2. I am also going to recommend that we have the patient continue to monitor  for any signs of infection ongoing. Although I think she is honestly can be healed within the next 1 to 2 weeks. We will plan to see her in 2 weeks for a follow-up visit and see where things stand at that point. We will see patient back for reevaluation in 1 week here in the clinic. If anything worsens or changes patient will contact our office for additional recommendations. Electronic Signature(s) Signed: 11/04/2021 11:39:09 AM By: Worthy Keeler PA-C Entered By: Worthy Keeler on 11/04/2021 11:39:09 Chatterjee, Kelly [name]) -------------------------------------------------------------------------------- SuperBill Details Patient Name: Kelly [name] Date of Service: 11/04/2021 Medical Record Number: [id number] Patient Account Number: [id number] Date of Birth/Sex: 1940-06-14 (  81 y.o. F) Treating RN: Primary Care Provider: Tommi Rumps Other Clinician: Referring Provider: Tommi Rumps Treating Provider/Extender: Skipper Cliche in Treatment: 1 Diagnosis Coding ICD-10 Codes Code Description E11.622 Type 2 diabetes mellitus with other skin ulcer L97.812 Non-pressure chronic ulcer of other part of right lower leg with fat layer exposed I10 Essential (primary) hypertension N18.4 Chronic kidney disease, stage 4 (severe) I87.331 Chronic venous hypertension (idiopathic) with ulcer and inflammation of right lower extremity Facility Procedures CPT4 Code: [id number] Description: 99213 - WOUND CARE VISIT-LEV 3 EST PT Modifier: Quantity: 1 Physician Procedures CPT4 Code: [id number] Description: 99213 - WC PHYS LEVEL 3 - EST PT Modifier: Quantity: 1 CPT4 Code: Description: ICD-10 Diagnosis Description E11.622 Type 2 diabetes mellitus with other skin ulcer L97.812 Non-pressure chronic ulcer of other part of right lower leg with fat la I10 Essential (primary) hypertension N18.4 Chronic kidney disease, stage 4  (severe) Modifier: yer exposed Quantity: Electronic  Signature(s) Signed: 11/04/2021 11:39:34 AM By: Worthy Keeler PA-C Entered By: Worthy Keeler on 11/04/2021 11:39:34

## 2021-11-04 NOTE — Telephone Encounter (Signed)
No answer, no VM.  [place] Chatmon,cma  

## 2021-11-04 NOTE — Telephone Encounter (Signed)
I called patient and and she is scheduled to see provider next week. Kelly [name],cma

## 2021-11-05 ENCOUNTER — Ambulatory Visit: Payer: Medicare Other

## 2021-11-05 NOTE — Progress Notes (Signed)
Kelly [name] ([id number]) Visit Report for 11/04/2021 Arrival Information Details Patient Name: Kelly [name], Kelly [name]. Date of Service: 11/04/2021 10:45 AM Medical Record Number: [id number] Patient Account Number: [id number] Date of Birth/Sex: [birth year] (81 y.o. F) Treating RN: Primary Care Kaito Schulenburg: Tommi Rumps Other Clinician: Referring Deylan Canterbury: Tommi Rumps Treating Kenyatta Gloeckner/Extender: Skipper Cliche in Treatment: 1 Visit Information History Since Last Visit All ordered tests and consults were completed: No Patient Arrived: Ambulatory Added or deleted any medications: No Arrival Time: 10:54 Any new allergies or adverse reactions: No Transfer Assistance: None Had a fall or experienced change in No Patient Has Alerts: Yes activities of daily living that may affect Patient Alerts: Patient on Blood Thinner risk of falls: Type II Diabetic Hospitalized since last visit: No '81mg'$  aspirin Pain Present Now: No Electronic Signature(s) Signed: 11/05/2021 4:25:13 PM By: Massie Kluver Entered By: Massie Kluver on 11/04/2021 10:54:50 Weniger, Milinda Pointer ([id number]) -------------------------------------------------------------------------------- Clinic Level of Care Assessment Details Patient Name: Kelly [name] Date of Service: 11/04/2021 10:45 AM Medical Record Number: [id number] Patient Account Number: [id number] Date of Birth/Sex: Jul 07, 1940 (81 y.o. F) Treating RN: Primary Care Kaliyan Osbourn: Tommi Rumps Other Clinician: Referring Charis Juliana: Tommi Rumps Treating Tayen Narang/Extender: Skipper Cliche in Treatment: 1 Clinic Level of Care Assessment Items TOOL 4 Quantity Score '[]'$  - Use when only an EandM is performed on FOLLOW-UP visit 0 ASSESSMENTS - Nursing Assessment / Reassessment X - Reassessment of Co-morbidities (includes updates in patient status) 1 10 X- 1 5 Reassessment of Adherence to Treatment Plan ASSESSMENTS - Wound and Skin  Assessment / Reassessment X - Simple Wound Assessment / Reassessment - one wound 1 5 '[]'$  - 0 Complex Wound Assessment / Reassessment - multiple wounds '[]'$  - 0 Dermatologic / Skin Assessment (not related to wound area) ASSESSMENTS - Focused Assessment '[]'$  - Circumferential Edema Measurements - multi extremities 0 '[]'$  - 0 Nutritional Assessment / Counseling / Intervention '[]'$  - 0 Lower Extremity Assessment (monofilament, tuning fork, pulses) '[]'$  - 0 Peripheral Arterial Disease Assessment (using hand held doppler) ASSESSMENTS - Ostomy and/or Continence Assessment and Care '[]'$  - Incontinence Assessment and Management 0 '[]'$  - 0 Ostomy Care Assessment and Management (repouching, etc.) PROCESS - Coordination of Care X - Simple Patient / Family Education for ongoing care 1 15 '[]'$  - 0 Complex (extensive) Patient / Family Education for ongoing care X- 1 10 Staff obtains Programmer, systems, Records, Test Results / Process Orders '[]'$  - 0 Staff telephones HHA, Nursing Homes / Clarify orders / etc '[]'$  - 0 Routine Transfer to another Facility (non-emergent condition) '[]'$  - 0 Routine Hospital Admission (non-emergent condition) '[]'$  - 0 New Admissions / Biomedical engineer / Ordering NPWT, Apligraf, etc. '[]'$  - 0 Emergency Hospital Admission (emergent condition) X- 1 10 Simple Discharge Coordination '[]'$  - 0 Complex (extensive) Discharge Coordination PROCESS - Special Needs '[]'$  - Pediatric / Minor Patient Management 0 '[]'$  - 0 Isolation Patient Management '[]'$  - 0 Hearing / Language / Visual special needs '[]'$  - 0 Assessment of Community assistance (transportation, D/C planning, etc.) '[]'$  - 0 Additional assistance / Altered mentation '[]'$  - 0 Support Surface(s) Assessment (bed, cushion, seat, etc.) INTERVENTIONS - Wound Cleansing / Measurement Rund, Jamirah G. ([id number]) X- 1 5 Simple Wound Cleansing - one wound '[]'$  - 0 Complex Wound Cleansing - multiple wounds X- 1 5 Wound Imaging (photographs - any  number of wounds) '[]'$  - 0 Wound Tracing (instead of photographs) X- 1 5 Simple Wound Measurement - one wound '[]'$  - 0 Complex Wound Measurement - multiple  wounds INTERVENTIONS - Wound Dressings X - Small Wound Dressing one or multiple wounds 1 10 '[]'$  - 0 Medium Wound Dressing one or multiple wounds '[]'$  - 0 Large Wound Dressing one or multiple wounds '[]'$  - 0 Application of Medications - topical '[]'$  - 0 Application of Medications - injection INTERVENTIONS - Miscellaneous '[]'$  - External ear exam 0 '[]'$  - 0 Specimen Collection (cultures, biopsies, blood, body fluids, etc.) '[]'$  - 0 Specimen(s) / Culture(s) sent or taken to Lab for analysis '[]'$  - 0 Patient Transfer (multiple staff / Civil Service fast streamer / Similar devices) '[]'$  - 0 Simple Staple / Suture removal (25 or less) '[]'$  - 0 Complex Staple / Suture removal (26 or more) '[]'$  - 0 Hypo / Hyperglycemic Management (close monitor of Blood Glucose) '[]'$  - 0 Ankle / Brachial Index (ABI) - do not check if billed separately X- 1 5 Vital Signs Has the patient been seen at the hospital within the last three years: Yes Total Score: 85 Level Of Care: New/Established - Level 3 Electronic Signature(s) Signed: 11/05/2021 4:25:13 PM By: Massie Kluver Entered By: Massie Kluver on 11/04/2021 11:34:48 Nielsen, Milinda Pointer ([id number]) -------------------------------------------------------------------------------- Encounter Discharge Information Details Patient Name: Kelly [name] Date of Service: 11/04/2021 10:45 AM Medical Record Number: [id number] Patient Account Number: [id number] Date of Birth/Sex: 1940/10/08 (81 y.o. F) Treating RN: Primary Care Katana Berthold: Tommi Rumps Other Clinician: Referring Reisha Wos: Tommi Rumps Treating Takara Sermons/Extender: Skipper Cliche in Treatment: 1 Encounter Discharge Information Items Discharge Condition: Stable Ambulatory Status: Ambulatory Discharge Destination: Home Transportation: Private  Auto Accompanied By: self Schedule Follow-up Appointment: Yes Clinical Summary of Care: Electronic Signature(s) Signed: 11/05/2021 4:25:13 PM By: Massie Kluver Entered By: Massie Kluver on 11/04/2021 11:41:33 Lemke, Milinda Pointer ([id number]) -------------------------------------------------------------------------------- Lower Extremity Assessment Details Patient Name: Kelly [name] Date of Service: 11/04/2021 10:45 AM Medical Record Number: [id number] Patient Account Number: [id number] Date of Birth/Sex: [birth year] (81 y.o. F) Treating RN: Primary Care Alfons Sulkowski: Tommi Rumps Other Clinician: Referring Maribel Hadley: Tommi Rumps Treating Jesus Poplin/Extender: Skipper Cliche in Treatment: 1 Edema Assessment Assessed: [Left: No] [Right: Yes] Edema: [Left: Ye] [Right: s] Calf Left: Right: Point of Measurement: From Medial Instep 31 cm Ankle Left: Right: Point of Measurement: From Medial Instep 20 cm Vascular Assessment Pulses: Dorsalis Pedis Palpable: [Right:Yes] Electronic Signature(s) Signed: 11/05/2021 4:25:13 PM By: Massie Kluver Entered By: Massie Kluver on 11/04/2021 11:03:58 Wafer, Milinda Pointer ([id number]) -------------------------------------------------------------------------------- Multi Wound Chart Details Patient Name: Kelly [name] Date of Service: 11/04/2021 10:45 AM Medical Record Number: [id number] Patient Account Number: [id number] Date of Birth/Sex: 05-28-1940 (81 y.o. F) Treating RN: Primary Care Alize Borrayo: Tommi Rumps Other Clinician: Referring Zurii Hewes: Tommi Rumps Treating Malani Lees/Extender: Skipper Cliche in Treatment: 1 Vital Signs Height(in): 68 Pulse(bpm): 52 Weight(lbs): 118 Blood Pressure(mmHg): 169/77 Body Mass Index(BMI): 20.3 Temperature(F): 97.8 Respiratory Rate(breaths/min): 18 Photos: [N/A:N/A] Wound Location: Right, Midline Lower Leg N/A N/A Wounding Event: Trauma N/A N/A Primary  Etiology: Diabetic Wound/Ulcer of the Lower N/A N/A Extremity Secondary Etiology: Trauma, Other N/A N/A Comorbid History: Sleep Apnea, Hypertension, Type II N/A N/A Diabetes, End Stage Renal Disease, Osteoarthritis, Neuropathy Date Acquired: 09/21/2021 N/A N/A Weeks of Treatment: 1 N/A N/A Wound Status: Open N/A N/A Wound Recurrence: No N/A N/A Measurements L x W x D (cm) 0.3x0.3x0.2 N/A N/A Area (cm) : 0.071 N/A N/A Volume (cm) : 0.014 N/A N/A % Reduction in Area: 67.70% N/A N/A % Reduction in Volume: 78.80% N/A N/A Classification: Grade 2 N/A N/A Exudate Amount: Medium N/A N/A Exudate Type: Serous N/A  N/A Exudate Color: amber N/A N/A Wound Margin: Thickened N/A N/A Granulation Amount: None Present (0%) N/A N/A Necrotic Amount: Medium (34-66%) N/A N/A Exposed Structures: Fat Layer (Subcutaneous Tissue): N/A N/A Yes Fascia: No Tendon: No Muscle: No Joint: No Bone: No Epithelialization: None N/A N/A Treatment Notes Electronic Signature(s) Signed: 11/05/2021 4:25:13 PM By: Massie Kluver Entered By: Massie Kluver on 11/04/2021 11:04:12 Almgren, Milinda Pointer ([id number]) Theodora Blow, Milinda Pointer ([id number]) -------------------------------------------------------------------------------- [place] Details Patient Name: Kelly [name] Date of Service: 11/04/2021 10:45 AM Medical Record Number: [id number] Patient Account Number: [id number] Date of Birth/Sex: [birth year] (81 y.o. F) Treating RN: Primary Care Decklan Mau: Tommi Rumps Other Clinician: Referring Aviraj Kentner: Tommi Rumps Treating Rigo Letts/Extender: Skipper Cliche in Treatment: 1 Active Inactive Abuse / Safety / Falls / Self Care Management Nursing Diagnoses: Potential for falls Goals: Patient/caregiver will verbalize understanding of skin care regimen Date Initiated: 10/28/2021 Target Resolution Date: 10/28/2021 Goal Status: Active Interventions: Provide education on  personal and home safety Notes: Orientation to the Wound Care Program Nursing Diagnoses: Knowledge deficit related to the wound healing center program Goals: Patient/caregiver will verbalize understanding of the [place] Date Initiated: 10/28/2021 Target Resolution Date: 10/28/2021 Goal Status: Active Interventions: Provide education on orientation to the wound center Notes: Soft Tissue Infection Nursing Diagnoses: Impaired tissue integrity Knowledge deficit related to disease process and management Knowledge deficit related to home infection control: handwashing, handling of soiled dressings, supply storage Potential for infection: soft tissue Goals: Signs and symptoms of infection will be recognized early to allow for prompt treatment Date Initiated: 10/28/2021 Target Resolution Date: 10/28/2021 Goal Status: Active Interventions: Assess signs and symptoms of infection every visit Provide education on infection Treatment Activities: Education provided on Infection : 10/28/2021 Systemic antibiotics : 10/28/2021 Notes: Wound/Skin Impairment MORIYA, MITCHELL ([id number]) Nursing Diagnoses: Impaired tissue integrity Goals: Patient/caregiver will verbalize understanding of skin care regimen Date Initiated: 10/28/2021 Target Resolution Date: 10/28/2021 Goal Status: Active Ulcer/skin breakdown will have a volume reduction of 30% by week 4 Date Initiated: 10/28/2021 Target Resolution Date: 11/25/2021 Goal Status: Active Interventions: Assess ulceration(s) every visit Treatment Activities: Topical wound management initiated : 10/28/2021 Notes: Electronic Signature(s) Signed: 11/05/2021 4:25:13 PM By: Massie Kluver Entered By: Massie Kluver on 11/04/2021 11:04:02 Stopa, Milinda Pointer ([id number]) -------------------------------------------------------------------------------- Pain Assessment Details Patient Name: Kelly [name] Date of  Service: 11/04/2021 10:45 AM Medical Record Number: [id number] Patient Account Number: [id number] Date of Birth/Sex: 10/04/1940 (81 y.o. F) Treating RN: Primary Care Rosalie Gelpi: Tommi Rumps Other Clinician: Referring Aryona Sill: Tommi Rumps Treating Via Rosado/Extender: Skipper Cliche in Treatment: 1 Active Problems Location of Pain Severity and Description of Pain Patient Has Paino No Site Locations Pain Management and Medication Current Pain Management: Electronic Signature(s) Signed: 11/05/2021 4:25:13 PM By: Massie Kluver Entered By: Massie Kluver on 11/04/2021 10:57:55 Hen[place]ton, Milinda Pointer ([id number]) -------------------------------------------------------------------------------- Patient/Caregiver Education Details Patient Name: Kelly [name] Date of Service: 11/04/2021 10:45 AM Medical Record Number: [id number] Patient Account Number: [id number] Date of Birth/Gender: 08/12/1940 (81 y.o. F) Treating RN: Primary Care Physician: Tommi Rumps Other Clinician: Referring Physician: Tommi Rumps Treating Physician/Extender: Skipper Cliche in Treatment: 1 Education Assessment Education Provided To: Patient Education Topics Provided Wound/Skin Impairment: Handouts: Other: continue wound care as directed Electronic Signature(s) Signed: 11/05/2021 4:25:13 PM By: Massie Kluver Entered By: Massie Kluver on 11/04/2021 11:35:13 Gullick, Milinda Pointer ([id number]) -------------------------------------------------------------------------------- Wound Assessment Details Patient Name: Kelly [name] Date of Service: 11/04/2021 10:45 AM Medical Record Number: [id number] Patient Account Number: [id number] Date of Birth/Sex:  16-Aug-1940 (81 y.o. F) Treating RN: Primary Care Rose-Marie Hickling: Tommi Rumps Other Clinician: Referring Muadh Creasy: Tommi Rumps Treating Alyrica Thurow/Extender: Skipper Cliche in Treatment: 1 Wound Status Wound Number: 1 Primary  Diabetic Wound/Ulcer of the Lower Extremity Etiology: Wound Location: Right, Midline Lower Leg Secondary Trauma, Other Wounding Event: Trauma Etiology: Date Acquired: 09/21/2021 Wound Status: Open Weeks Of Treatment: 1 Comorbid Sleep Apnea, Hypertension, Type II Diabetes, End Stage Clustered Wound: No History: Renal Disease, Osteoarthritis, Neuropathy Photos Wound Measurements Length: (cm) 0.3 % Reduc Width: (cm) 0.3 % Reduc Depth: (cm) 0.2 Epithel Area: (cm) 0.071 Volume: (cm) 0.014 tion in Area: 67.7% tion in Volume: 78.8% ialization: None Wound Description Classification: Grade 2 Foul Od Wound Margin: Thickened Slough/ Exudate Amount: Medium Exudate Type: Serous Exudate Color: amber or After Cleansing: No Fibrino Yes Wound Bed Granulation Amount: None Present (0%) Exposed Structure Necrotic Amount: Medium (34-66%) Fascia Exposed: No Necrotic Quality: Adherent Slough Fat Layer (Subcutaneous Tissue) Exposed: Yes Tendon Exposed: No Muscle Exposed: No Joint Exposed: No Bone Exposed: No Treatment Notes Wound #1 (Lower Leg) Wound Laterality: Right, Midline Cleanser Peri-Wound Care Topical [place]in[place], Renell G. ([id number]) Primary Dressing Prisma 4.34 (in) Discharge Instruction: Moisten w/normal saline or sterile water; Cover wound as directed. Do not remove from wound bed. Secondary Dressing Secured With Compression Wrap Compression Stockings Add-Ons Curity Flexible Adhesive Bandage, Xlarge, 2x3.25 (in/in) Discharge Instruction: 2x3.25 bandage Electronic Signature(s) Signed: 11/05/2021 4:25:13 PM By: Massie Kluver Entered By: Massie Kluver on 11/04/2021 11:02:39 Neumeier, Milinda Pointer ([id number]) -------------------------------------------------------------------------------- [place] Details Patient Name: Kelly [name] Date of Service: 11/04/2021 10:45 AM Medical Record Number: [id number] Patient Account Number: [id number] Date of Birth/Sex:  15-Jan-1941 (81 y.o. F) Treating RN: Primary Care Claude Swendsen: Tommi Rumps Other Clinician: Referring Zondra Lawlor: Tommi Rumps Treating Ann-Marie Kluge/Extender: Skipper Cliche in Treatment: 1 Vital Signs Time Taken: 10:55 Temperature (F): 97.8 Height (in): 64 Pulse (bpm): 66 Weight (lbs): 118 Respiratory Rate (breaths/min): 18 Body Mass Index (BMI): 20.3 Blood Pressure (mmHg): 169/77 Reference Range: 80 - 120 mg / dl Electronic Signature(s) Signed: 11/05/2021 4:25:13 PM By: Massie Kluver Entered By: Massie Kluver on 11/04/2021 10:57:36

## 2021-11-08 ENCOUNTER — Ambulatory Visit (INDEPENDENT_AMBULATORY_CARE_PROVIDER_SITE_OTHER): Payer: Medicare Other | Admitting: Family Medicine

## 2021-11-08 ENCOUNTER — Encounter: Payer: Self-pay | Admitting: Family Medicine

## 2021-11-08 DIAGNOSIS — M25572 Pain in left ankle and joints of left foot: Secondary | ICD-10-CM

## 2021-11-08 DIAGNOSIS — M25571 Pain in right ankle and joints of right foot: Secondary | ICD-10-CM | POA: Diagnosis not present

## 2021-11-08 DIAGNOSIS — Z794 Long term (current) use of insulin: Secondary | ICD-10-CM

## 2021-11-08 DIAGNOSIS — M2550 Pain in unspecified joint: Secondary | ICD-10-CM | POA: Insufficient documentation

## 2021-11-08 DIAGNOSIS — E1122 Type 2 diabetes mellitus with diabetic chronic kidney disease: Secondary | ICD-10-CM

## 2021-11-08 DIAGNOSIS — R600 Localized edema: Secondary | ICD-10-CM | POA: Diagnosis not present

## 2021-11-08 DIAGNOSIS — N184 Chronic kidney disease, stage 4 (severe): Secondary | ICD-10-CM | POA: Diagnosis not present

## 2021-11-08 LAB — COMPREHENSIVE METABOLIC PANEL
ALT: 30 U/L (ref 0–35)
AST: 21 U/L (ref 0–37)
Albumin: 3.8 g/dL (ref 3.5–5.2)
Alkaline Phosphatase: 70 U/L (ref 39–117)
BUN: 14 mg/dL (ref 6–23)
CO2: 26 mEq/L (ref 19–32)
Calcium: 7.8 mg/dL — ABNORMAL LOW (ref 8.4–10.5)
Chloride: 111 mEq/L (ref 96–112)
Creatinine, Ser: 1.56 mg/dL — ABNORMAL HIGH (ref 0.40–1.20)
GFR: 31.22 mL/min — ABNORMAL LOW (ref >60.00)
Glucose, Bld: 131 mg/dL — ABNORMAL HIGH (ref 70–99)
Potassium: 3.6 mEq/L (ref 3.5–5.1)
Sodium: 145 mEq/L (ref 135–145)
Total Bilirubin: 0.5 mg/dL (ref 0.2–1.2)
Total Protein: 5.9 g/dL — ABNORMAL LOW (ref 6.0–8.3)

## 2021-11-08 LAB — CBC
HCT: 39.7 % (ref 36.0–46.0)
Hemoglobin: 12.9 g/dL (ref 12.0–15.0)
MCHC: 32.5 g/dL (ref 30.0–36.0)
MCV: 94.8 fl (ref 78.0–100.0)
Platelets: 131 10*3/uL — ABNORMAL LOW (ref 150.0–400.0)
RBC: 4.19 Mil/uL (ref 3.87–5.11)
RDW: 14.4 % (ref 11.5–15.5)
WBC: 5.4 10*3/uL (ref 4.0–10.5)

## 2021-11-08 LAB — TSH: TSH: 4.41 u[IU]/mL (ref 0.35–5.50)

## 2021-11-08 LAB — URIC ACID: Uric Acid, Serum: 4.5 mg/dL (ref 2.4–7.0)

## 2021-11-08 MED ORDER — BASAGLAR KWIKPEN 100 UNIT/ML ~~LOC~~ SOPN: 6.0000 [IU] | PEN_INJECTOR | Freq: Every day | SUBCUTANEOUS | 1 refills | Status: DC

## 2021-11-08 NOTE — Patient Instructions (Signed)
Nice to see you. You can try icing your ankles and feet. We will get lab work today to look for a cause of your symptoms. Please increase your Victoza back to 1.8 mg daily.  You will reduce your Basaglar to 6 units daily.  If you continue to have sugars less than 70 with these changes please let me know.

## 2021-11-08 NOTE — Assessment & Plan Note (Signed)
Patient with bilateral foot and ankle pain.  Suspect tendinitis though this could represent gout.  We will check a uric acid.  She will ice the area of concern.  If not improving she will let us know.  I did discuss wearing better fitting shoes.

## 2021-11-08 NOTE — Progress Notes (Signed)
Tommi Rumps, MD Phone: [phone]  Kelly [name] is a 81 y.o. female who presents today for same-day visit.  Bilateral ankle pain and swelling: This has been going on for a week or so.  Right greater than left.  No injury.  She had some swelling in her bilateral feet and ankles.  She has discomfort along the peroneal tendon area of the right ankle and in the first MTP joint of the left foot and lateral left ankle.  She went to urgent care and had an x-ray of her right ankle that was negative.  Patient reports she has been dancing more in poorly fitting shoes over the last several weeks.  No shortness of breath, orthopnea, or PND  Diabetes: The patient decreased her Victoza to 1.2 mg daily last week as she was having sugars down into the low 60s in upper 50s 4 times in the last several weeks.  She has been taking Basaglar 8 units daily.  She typically takes her Basaglar and Victoza around noon though her daughter reports there are days where she takes it later in the day.  She has a meal around noon and then has a meal around 6 PM.  7-day average glucose is 160.  Her 90-day average glucose is up into the 180s.  She has been using her freestyle libre.  Social History   Tobacco Use  Smoking Status Never  Smokeless Tobacco Never    Current Outpatient Medications on File Prior to Visit  Medication Sig Dispense Refill   amLODipine (NORVASC) 5 MG tablet TAKE 1 TABLET DAILY 90 tablet 3   aspirin EC 81 MG tablet Take 81 mg by mouth daily.     atorvastatin (LIPITOR) 80 MG tablet Take 1 tablet (80 mg total) by mouth daily. 90 tablet 3   BD PEN NEEDLE NANO 2ND GEN 32G X 4 MM MISC USE DAILY WITH VICTOZA AND BASAGLAR 180 each 0   calcitRIOL (ROCALTROL) 0.25 MCG capsule Take 0.25 mcg by mouth daily.     Cholecalciferol (VITAMIN D) 50 MCG (2000 UT) CAPS Take 2,000 Units by mouth daily.      clindamycin (CLEOCIN) 300 MG capsule Take 1 capsule (300 mg total) by mouth in the morning and at  bedtime. With food 14 capsule 0   Continuous Blood Gluc Sensor (FREESTYLE LIBRE 2 SENSOR) MISC USE TO CHECK SUGAR AT LEAST4 TIMES A DAY 6 each 3   cyclobenzaprine (FLEXERIL) 10 MG tablet TAKE 1/2 TABLET (5MG TOTAL)3 TIMES A DAY AS NEEDED    FOR MUSCLE SPASMS 45 tablet 0   denosumab (PROLIA) 60 MG/ML SOSY injection Inject 60 mg into the skin every 6 (six) months.     furosemide (LASIX) 40 MG tablet Take 40 mg by mouth daily as needed.     gabapentin (NEURONTIN) 100 MG capsule Take 1 capsule (100 mg total) by mouth 3 (three) times daily. 180 capsule 1   glucosamine-chondroitin 500-400 MG tablet Take 1 tablet by mouth 3 (three) times daily.     glucose blood (ACCU-CHEK AVIVA PLUS) test strip Use to check blood sugar up to 4 times daily 100 each 3   HYDROcodone-acetaminophen (NORCO/VICODIN) 5-325 MG tablet Take 1 tablet by mouth every 4 (four) hours as needed.     Lancets MISC Use up to 4 times daily to check blood sugars. 200 each 11   liraglutide (VICTOZA) 18 MG/3ML SOPN Inject 1.8 mg into the skin daily. 27 mL 1   losartan (COZAAR) 50 MG tablet  TAKE 1 TABLET DAILY 90 tablet 1   Melatonin 5 MG CHEW Chew 5 mg by mouth daily.     potassium chloride (K-DUR) 10 MEQ tablet Take 10 mEq by mouth daily as needed.     sertraline (ZOLOFT) 50 MG tablet Take 1 tablet (50 mg total) by mouth daily. 90 tablet 2   mupirocin ointment (BACTROBAN) 2 % Apply 1 application. topically 2 (two) times daily. Right leg wound (Patient not taking: Reported on 11/08/2021) 30 g 0   Current Facility-Administered Medications on File Prior to Visit  Medication Dose Route Frequency Provider Last Rate Last Admin   denosumab (PROLIA) injection 60 mg  60 mg Subcutaneous Q6 months [name], MD   60 mg at 04/05/21 1307     ROS see history of present illness  Objective  Physical Exam Vitals:   11/08/21 1357  BP: 128/76  Pulse: 73  Temp: 98.4 F (36.9 C)  SpO2: 96%    BP Readings from Last 3 Encounters:  11/08/21  128/76  11/01/21 (!) 146/70  10/05/21 130/70   Wt Readings from Last 3 Encounters:  11/08/21 126 lb 3.2 oz (57.2 kg)  10/05/21 122 lb 6.4 oz (55.5 kg)  10/01/21 123 lb (55.8 kg)    Physical Exam Constitutional:      General: She is not in acute distress.    Appearance: She is not diaphoretic.  Cardiovascular:     Rate and Rhythm: Normal rate and regular rhythm.     Heart sounds: Normal heart sounds.  Pulmonary:     Effort: Pulmonary effort is normal.     Breath sounds: Normal breath sounds.  Musculoskeletal:       Legs:     Comments: There is tenderness over her left first MTP joint and lateral left ankle with no bony tenderness  Skin:    General: Skin is warm and dry.  Neurological:     Mental Status: She is alert.      Assessment/Plan: Please see individual problem list.  Problem List Items Addressed This Visit     Type 2 diabetes mellitus (HCC) (Chronic)    Remains poorly controlled.  She did have several low sugars and thus we will reduce her Basaglar to 6 units daily.  She will increase her Victoza back to 1.8 mg daily.  She will continue to monitor her sugars with her freestyle libre as that has been beneficial.  I did discuss dietary changes and reduction in soda intake.  Discussed the long-term damage that can be done by uncontrolled diabetes including cardiovascular disease and kidney disease.  She will follow-up in 6 weeks.      Relevant Medications   Insulin Glargine (BASAGLAR KWIKPEN) 100 UNIT/ML   Arthralgia    Patient with bilateral foot and ankle pain.  Suspect tendinitis though this could represent gout.  We will check a uric acid.  She will ice the area of concern.  If not improving she will let us know.  I did discuss wearing better fitting shoes.      Relevant Orders   Uric acid   Edema of lower extremity    Bilateral ankle and foot swelling.  This may be related to tendinitis or underlying arthritis.  She notes no symptoms concerning for cardiac  cause.  We will check lab work to evaluate for underlying cause.      Relevant Orders   Comp Met (CMET)   CBC   TSH    Return for As  scheduled.   Tommi Rumps, MD [place]

## 2021-11-08 NOTE — Assessment & Plan Note (Signed)
Bilateral ankle and foot swelling.  This may be related to tendinitis or underlying arthritis.  She notes no symptoms concerning for cardiac cause.  We will check lab work to evaluate for underlying cause.

## 2021-11-08 NOTE — Assessment & Plan Note (Signed)
Remains poorly controlled.  She did have several low sugars and thus we will reduce her Basaglar to 6 units daily.  She will increase her Victoza back to 1.8 mg daily.  She will continue to monitor her sugars with her freestyle libre as that has been beneficial.  I did discuss dietary changes and reduction in soda intake.  Discussed the long-term damage that can be done by uncontrolled diabetes including cardiovascular disease and kidney disease.  She will follow-up in 6 weeks.

## 2021-11-10 ENCOUNTER — Other Ambulatory Visit: Payer: Self-pay | Admitting: Family Medicine

## 2021-11-10 DIAGNOSIS — D696 Thrombocytopenia, unspecified: Secondary | ICD-10-CM

## 2021-11-11 ENCOUNTER — Ambulatory Visit: Payer: Medicare Other | Admitting: Internal Medicine

## 2021-11-18 ENCOUNTER — Encounter: Payer: Medicare Other | Attending: Physician Assistant | Admitting: Physician Assistant

## 2021-11-18 DIAGNOSIS — L97812 Non-pressure chronic ulcer of other part of right lower leg with fat layer exposed: Secondary | ICD-10-CM | POA: Diagnosis not present

## 2021-11-18 DIAGNOSIS — I872 Venous insufficiency (chronic) (peripheral): Secondary | ICD-10-CM | POA: Insufficient documentation

## 2021-11-18 DIAGNOSIS — I120 Hypertensive chronic kidney disease with stage 5 chronic kidney disease or end stage renal disease: Secondary | ICD-10-CM | POA: Diagnosis not present

## 2021-11-18 DIAGNOSIS — E1122 Type 2 diabetes mellitus with diabetic chronic kidney disease: Secondary | ICD-10-CM | POA: Insufficient documentation

## 2021-11-18 DIAGNOSIS — N184 Chronic kidney disease, stage 4 (severe): Secondary | ICD-10-CM | POA: Diagnosis not present

## 2021-11-18 DIAGNOSIS — E11622 Type 2 diabetes mellitus with other skin ulcer: Secondary | ICD-10-CM | POA: Insufficient documentation

## 2021-11-18 NOTE — Progress Notes (Signed)
GLEN, BLATCHLEY ([id number]) Visit Report for 11/18/2021 Arrival Information Details Patient Name: Kelly [name], Kelly [name]. Date of Service: 11/18/2021 11:15 AM Medical Record Number: [id number] Patient Account Number: [id number] Date of Birth/Sex: [birth year] (81 y.o. F) Treating RN: Kelly [name] Primary Care Kelly [name]: Kelly [name] Other Clinician: Referring Kelly [name]: Kelly [name] Treating Kelly [name]/Extender: Kelly [name] in Treatment: 3 Visit Information History Since Last Visit Added or deleted any medications: No Patient Arrived: Ambulatory Any new allergies or adverse reactions: No Arrival Time: 11:15 Had a fall or experienced change in No Accompanied By: self activities of daily living that may affect Transfer Assistance: None risk of falls: Patient Identification Verified: Yes Hospitalized since last visit: No Secondary Verification Process Completed: Yes Has Dressing in Place as Prescribed: No Patient Has Alerts: Yes Pain Present Now: No Patient Alerts: Patient on Blood Thinner Type II Diabetic 32m aspirin Electronic Signature(s) Signed: 11/18/2021 2:50:17 PM By: GLevora DredgeEntered By: GLevora Dredgeon 11/18/2021 11:16:53 Ketcham, MMilinda [name]([id number] -------------------------------------------------------------------------------- Clinic Level of Care Assessment Details Patient Name: SSoundra PilonDate of Service: 11/18/2021 11:15 AM Medical Record Number: [id number] Account Number: [id number] of Birth/Sex: 1Jan 20, 1942(81y.o. F) Treating RN: GLevora DredgePrimary Care Chau Savell: STommi RumpsOther Clinician: Referring Maiana Hennigan: STommi RumpsTreating Kelly [name]/Extender: SSkipper Clichein Treatment: 3 Clinic Level of Care Assessment Items TOOL 1 Quantity Score _0  - Use when EandM and Procedure is performed on INITIAL visit 0 ASSESSMENTS - Nursing Assessment / Reassessment _1  - General Physical Exam  (combine w/ comprehensive assessment (listed just below) when performed on new 0 pt. evals) _2  - 0 Comprehensive Assessment (HX, ROS, Risk Assessments, Wounds Hx, etc.) ASSESSMENTS - Wound and Skin Assessment / Reassessment _3  - Dermatologic / Skin Assessment (not related to wound area) 0 ASSESSMENTS - Ostomy and/or Continence Assessment and Care _4  - Incontinence Assessment and Management 0 _5  - 0 Ostomy Care Assessment and Management (repouching, etc.) PROCESS - Coordination of Care _6  - Simple Patient / Family Education for ongoing care 0 _7  - 0 Complex (extensive) Patient / Family Education for ongoing care _8  - 0 Staff obtains CProgrammer, systems Records, Test Results / Process Orders _9  - 0 Staff telephones HHA, Nursing Homes / Clarify orders / etc _10  - 0 Routine Transfer to another Facility (non-emergent condition) _11  - 0 Routine Hospital Admission (non-emergent condition) _12  - 0 New Admissions / IBiomedical engineer/ Ordering NPWT, Apligraf, etc. _13  - 0 Emergency Hospital Admission (emergent condition) PROCESS - Special Needs _14  - Pediatric / Minor Patient Management 0 _15  - 0 Isolation Patient Management _16  - 0 Hearing / Language / Visual special needs _17  - 0 Assessment of Community assistance (transportation, D/C planning, etc.) _18  - 0 Additional assistance / Altered mentation _19  - 0 Support Surface(s) Assessment (bed, cushion, seat, etc.) INTERVENTIONS - Miscellaneous _20  - External ear exam 0 _21  - 0 Patient Transfer (multiple staff / HCivil Service fast streamer/ Similar devices) _22  - 0 Simple Staple / Suture removal (25 or less) _23  - 0 Complex Staple / Suture removal (26 or more) _24  - 0 Hypo/Hyperglycemic Management (do not check if billed separately) _25  - 0 Ankle / Brachial Index (ABI) - do not check if billed separately Has the patient been seen at the hospital within the last three years: Yes Total Score: 0 Level Of Care: ____ SSoundra [name] ([id number] Electronic Signature(s) Signed: 11/18/2021 2:50:17 PM By: GLevora DredgeEntered By: GLevora Dredgeon 11/18/2021 11:39:11 Barretto, MMilinda [name]([id number] -------------------------------------------------------------------------------- Encounter Discharge Information  Details Patient Name: Kelly [name], Kelly [name]. Date of Service: 11/18/2021 11:15 AM Medical Record Number: [id number] Patient Account Number: [id number] Date of Birth/Sex: May 24, 1940 (81 y.o. F) Treating RN: Kelly [name] Primary Care Kelly [name]: Kelly [name] Other Clinician: Referring Kelly [name]: Kelly [name] Treating Jozalynn Noyce/Extender: Kelly [name] in Treatment: 3 Encounter Discharge Information Items Post Procedure Vitals Discharge Condition: Stable Temperature (F): 97.7 Ambulatory Status: Ambulatory Pulse (bpm): 65 Discharge Destination: Home Respiratory Rate (breaths/min): 18 Transportation: Private Auto Blood Pressure (mmHg): 172/76 Accompanied By: self Schedule Follow-up Appointment: Yes Clinical Summary of Care: Electronic Signature(s) Signed: 11/18/2021 11:40:33 AM By: Kelly [name] Entered By: Kelly [name] on 11/18/2021 11:40:33 Centola, Kelly [name] ([id number]) -------------------------------------------------------------------------------- Lower Extremity Assessment Details Patient Name: Kelly [name] Date of Service: 11/18/2021 11:15 AM Medical Record Number: [id number] Patient Account Number: [id number] Date of Birth/Sex: [birth year] (81 y.o. F) Treating RN: Kelly [name] Primary Care Rama Mcclintock: Kelly [name] Other Clinician: Referring Jamica Woodyard: Kelly [name] Treating Canda Podgorski/Extender: Jeri Cos Weeks in Treatment: 3 Edema Assessment Assessed: [Left: No] [Right: No] Edema: [Left: N] [Right: o] Electronic Signature(s) Signed: 11/18/2021 11:23:47 AM By: Kelly [name] Entered By: Kelly [name] on 11/18/2021 11:23:47 Kelly [name], Kelly [name]  ([id number]) -------------------------------------------------------------------------------- Multi Wound Chart Details Patient Name: Kelly [name] Date of Service: 11/18/2021 11:15 AM Medical Record Number: [id number] Patient Account Number: [id number] Date of Birth/Sex: 05-Jun-1940 (81 y.o. F) Treating RN: Kelly [name] Primary Care Nico Rogness: Kelly [name] Other Clinician: Referring Sumner Boesch: Kelly [name] Treating Lavette Yankovich/Extender: Kelly [name] in Treatment: 3 Vital Signs Height(in): 60 Pulse(bpm): 43 Weight(lbs): 118 Blood Pressure(mmHg): 172/76 Body Mass Index(BMI): 20.3 Temperature(F): 97.7 Respiratory Rate(breaths/min): 18 Photos: [N/A:N/A] Wound Location: Right, Midline Lower Leg N/A N/A Wounding Event: Trauma N/A N/A Primary Etiology: Diabetic Wound/Ulcer of the Lower N/A N/A Extremity Secondary Etiology: Trauma, Other N/A N/A Comorbid History: Sleep Apnea, Hypertension, Type II N/A N/A Diabetes, End Stage Renal Disease, Osteoarthritis, Neuropathy Date Acquired: 09/21/2021 N/A N/A Weeks of Treatment: 3 N/A N/A Wound Status: Open N/A N/A Wound Recurrence: No N/A N/A Measurements L x W x D (cm) 0.1x0.1x0.1 N/A N/A Area (cm) : 0.008 N/A N/A Volume (cm) : 0.001 N/A N/A % Reduction in Area: 96.40% N/A N/A % Reduction in Volume: 98.50% N/A N/A Classification: Grade 2 N/A N/A Exudate Amount: Medium N/A N/A Exudate Type: Serous N/A N/A Exudate Color: amber N/A N/A Wound Margin: Thickened N/A N/A Granulation Amount: None Present (0%) N/A N/A Necrotic Amount: Medium (34-66%) N/A N/A Exposed Structures: Fascia: No N/A N/A Fat Layer (Subcutaneous Tissue): No Tendon: No Muscle: No Joint: No Bone: No Epithelialization: Medium (34-66%) N/A N/A Treatment Notes Electronic Signature(s) Signed: 11/18/2021 11:24:23 AM By: Kelly [name] Entered By: Kelly [name] on 11/18/2021 11:24:23 Kelly [name], Kelly [name] ([id number]) Theodora Blow, Kelly [name]  ([id number]) -------------------------------------------------------------------------------- [place] Details Patient Name: Kelly [name] Date of Service: 11/18/2021 11:15 AM Medical Record Number: [id number] Patient Account Number: [id number] Date of Birth/Sex: 11-16-1940 (81 y.o. F) Treating RN: Kelly [name] Primary Care Jari Dipasquale: Kelly [name] Other Clinician: Referring Shenique Childers: Kelly [name] Treating Chau Sawin/Extender: Kelly [name] in Treatment: 3 Active Inactive Soft Tissue Infection Nursing Diagnoses: Impaired tissue integrity Knowledge deficit related to disease process and management Knowledge deficit related to home infection control: handwashing, handling of soiled dressings, supply storage Potential for infection: soft tissue Goals: Signs and symptoms of infection will be recognized early to allow for prompt treatment Date Initiated: 10/28/2021 Target Resolution Date: 10/28/2021 Goal Status: Active Interventions: Assess signs and symptoms of infection every visit Provide education on infection Treatment Activities: Education  provided on Infection : 10/28/2021 Systemic antibiotics : 10/28/2021 Notes: Wound/Skin Impairment Nursing Diagnoses: Impaired tissue integrity Goals: Patient/caregiver will verbalize understanding of skin care regimen Date Initiated: 10/28/2021 Date Inactivated: 11/18/2021 Target Resolution Date: 10/28/2021 Goal Status: Met Ulcer/skin breakdown will have a volume reduction of 30% by week 4 Date Initiated: 10/28/2021 Target Resolution Date: 11/25/2021 Goal Status: Active Interventions: Assess ulceration(s) every visit Treatment Activities: Topical wound management initiated : 10/28/2021 Notes: Electronic Signature(s) Signed: 11/18/2021 11:24:15 AM By: Kelly [name] Entered By: Kelly [name] on 11/18/2021 11:24:15 Langone, Kelly [name]  ([id number]) -------------------------------------------------------------------------------- Pain Assessment Details Patient Name: Kelly [name] Date of Service: 11/18/2021 11:15 AM Medical Record Number: [id number] Patient Account Number: [id number] Date of Birth/Sex: 20-Jan-1941 (81 y.o. F) Treating RN: Kelly [name] Primary Care Dominico Rod: Kelly [name] Other Clinician: Referring Anais Denslow: Kelly [name] Treating Kennetta Pavlovic/Extender: Kelly [name] in Treatment: 3 Active Problems Location of Pain Severity and Description of Pain Patient Has Paino No Site Locations Rate the pain. Current Pain Level: 0 Pain Management and Medication Current Pain Management: Electronic Signature(s) Signed: 11/18/2021 2:50:17 PM By: Kelly [name] Entered By: Kelly [name] on 11/18/2021 11:18:38 Dowell, Kelly [name] ([id number]) -------------------------------------------------------------------------------- Patient/Caregiver Education Details Patient Name: Kelly [name] Date of Service: 11/18/2021 11:15 AM Medical Record Number: [id number] Patient Account Number: [id number] Date of Birth/Gender: [birth year] (81 y.o. F) Treating RN: Kelly [name] Primary Care Physician: Kelly [name] Other Clinician: Referring Physician: Tommi [name] Treating Physician/Extender: Kelly [name] in Treatment: 3 Education Assessment Education Provided To: Patient Education Topics Provided Wound Debridement: Handouts: Wound Debridement Methods: Explain/Verbal Responses: State content correctly Wound/Skin Impairment: Handouts: Caring for Your Ulcer Methods: Explain/Verbal Responses: State content correctly Electronic Signature(s) Signed: 11/18/2021 2:50:17 PM By: Kelly [name] Entered By: Kelly [name] on 11/18/2021 11:39:37 Kiel, Kelly [name] ([id number]) -------------------------------------------------------------------------------- Wound Assessment  Details Patient Name: Kelly [name] Date of Service: 11/18/2021 11:15 AM Medical Record Number: [id number] Patient Account Number: [id number] Date of Birth/Sex: [birth year] (81 y.o. F) Treating RN: Kelly [name] Primary Care Armondo Cech: Kelly [name] Other Clinician: Referring Reubin Bushnell: Kelly [name] Treating Kelly Krapf/Extender: Kelly [name] in Treatment: 3 Wound Status Wound Number: 1 Primary Diabetic Wound/Ulcer of the Lower Extremity Etiology: Wound Location: Right, Midline Lower Leg Secondary Trauma, Other Wounding Event: Trauma Etiology: Date Acquired: 09/21/2021 Wound Status: Open Weeks Of Treatment: 3 Comorbid Sleep Apnea, Hypertension, Type II Diabetes, End Stage Clustered Wound: No History: Renal Disease, Osteoarthritis, Neuropathy Photos Wound Measurements Length: (cm) 0.1 % Reduc Width: (cm) 0.1 % Reduc Depth: (cm) 0.1 Epithel Area: (cm) 0.008 Tunnel Volume: (cm) 0.001 Underm tion in Area: 96.4% tion in Volume: 98.5% ialization: Medium (34-66%) ing: No ining: No Wound Description Classification: Grade 2 Foul Od Wound Margin: Thickened Slough/ Exudate Amount: Medium Exudate Type: Serous Exudate Color: amber or After Cleansing: No Fibrino Yes Wound Bed Granulation Amount: None Present (0%) Exposed Structure Necrotic Amount: Medium (34-66%) Fascia Exposed: No Necrotic Quality: Adherent Slough Fat Layer (Subcutaneous Tissue) Exposed: No Tendon Exposed: No Muscle Exposed: No Joint Exposed: No Bone Exposed: No Treatment Notes Wound #1 (Lower Leg) Wound Laterality: Right, Midline Cleanser Peri-Wound Care Topical Mancera, Leshonda G. ([id number]) Primary Dressing Xeroform 5x9-HBD (in/in) Discharge Instruction: Apply Xeroform 5x9-HBD (in/in) as directed Secondary Dressing Secured With Compression Wrap Compression Stockings Add-Ons Curity Flexible Adhesive Bandage, Xlarge, 2x3.25 (in/in) Discharge Instruction: 2x3.25  bandage Electronic Signature(s) Signed: 11/18/2021 2:50:17 PM By: Kelly [name] Entered By: Kelly [name] on 11/18/2021 11:22:39 Latendresse, Kelly [name] ([id number]) -------------------------------------------------------------------------------- Vitals Details Patient Name: Kelly [name] Date of Service: 11/18/2021  11:15 AM Medical Record Number: [id number] Patient Account Number: [id number] Date of Birth/Sex: 1941/01/23 (81 y.o. F) Treating RN: Kelly [name] Primary Care Abdon Petrosky: Kelly [name] Other Clinician: Referring Tariana Moldovan: Kelly [name] Treating Zamaya Rapaport/Extender: Kelly [name] in Treatment: 3 Vital Signs Time Taken: 11:16 Temperature (F): 97.7 Height (in): 64 Pulse (bpm): 65 Weight (lbs): 118 Respiratory Rate (breaths/min): 18 Body Mass Index (BMI): 20.3 Blood Pressure (mmHg): 172/76 Reference Range: 80 - 120 mg / dl Electronic Signature(s) Signed: 11/18/2021 2:50:17 PM By: Kelly [name] Entered By: Kelly [name] on 11/18/2021 11:18:31

## 2021-11-18 NOTE — Progress Notes (Addendum)
Kelly [name] ([id number]) Visit Report for 11/18/2021 Chief Complaint Document Details Patient Name: Kelly [name]. Date of Service: 11/18/2021 11:15 AM Medical Record Number: [id number] Patient Account Number: [id number] Date of Birth/Sex: 11-13-1940 (81 y.o. F) Treating RN: Levora Dredge Primary Care Provider: Tommi Rumps Other Clinician: Referring Provider: Tommi Rumps Treating Provider/Extender: Skipper Cliche in Treatment: 3 Information Obtained from: Patient Chief Complaint Right LE Ulcer Electronic Signature(s) Signed: 11/18/2021 11:23:09 AM By: Worthy Keeler PA-C Entered By: Worthy Keeler on 11/18/2021 11:23:09 Kelly [name] ([id number]) -------------------------------------------------------------------------------- Debridement Details Patient Name: Kelly [name] Date of Service: 11/18/2021 11:15 AM Medical Record Number: [id number] Patient Account Number: [id number] Date of Birth/Sex: 12/21/1940 (81 y.o. F) Treating RN: Levora Dredge Primary Care Provider: Tommi Rumps Other Clinician: Referring Provider: Tommi Rumps Treating Provider/Extender: Skipper Cliche in Treatment: 3 Debridement Performed for Wound #1 Right,Midline Lower Leg Assessment: Performed By: Physician Tommie Sams., PA-C Debridement Type: Debridement Severity of Tissue Pre Debridement: Fat layer exposed Level of Consciousness (Pre- Awake and Alert procedure): Pre-procedure Verification/Time Out Yes - 11:30 Taken: Pain Control: Lidocaine 4% Topical Solution Total Area Debrided (L x W): 0.1 (cm) x 0.1 (cm) = 0.01 (cm) Tissue and other material Viable, Non-Viable, Slough, Subcutaneous, Slough debrided: Level: Skin/Subcutaneous Tissue Debridement Description: Excisional Instrument: Curette Bleeding: Minimum Hemostasis Achieved: Pressure Response to Treatment: Procedure was tolerated well Level of Consciousness (Post- Awake and  Alert procedure): Post Debridement Measurements of Total Wound Length: (cm) 0.1 Width: (cm) 0.1 Depth: (cm) 0.1 Volume: (cm) 0.001 Character of Wound/Ulcer Post Debridement: Stable Severity of Tissue Post Debridement: Fat layer exposed Post Procedure Diagnosis Same as Pre-procedure Electronic Signature(s) Signed: 11/18/2021 2:50:17 PM By: Levora Dredge Signed: 11/18/2021 4:35:29 PM By: Worthy Keeler PA-C Entered By: Levora Dredge on 11/18/2021 11:30:55 Kelly [name], Kelly [name] ([id number]) -------------------------------------------------------------------------------- HPI Details Patient Name: Kelly [name] Date of Service: 11/18/2021 11:15 AM Medical Record Number: [id number] Patient Account Number: [id number] Date of Birth/Sex: 08/21/1940 (81 y.o. F) Treating RN: Levora Dredge Primary Care Provider: Tommi Rumps Other Clinician: Referring Provider: Tommi Rumps Treating Provider/Extender: Skipper Cliche in Treatment: 3 History of Present Illness HPI Description: 10-28-2021 upon evaluation today patient appears to be doing somewhat poorly in regard to a wound on her right leg. This happened when she had a concrete birdbath fall and hit her leg on May 16. She went to urgent care 2 days later since that time she has been on 2 rounds of doxycycline as well as a round of clindamycin the good news is I see no evidence of infection at this point. Fortunately there is no signs of active infection locally or systemically which is great news and overall I think you are on the right track as far as healing is concerned I do believe that there are some things we can do to help her out here. The patient does have a history of diabetes mellitus type 2, hypertension, chronic kidney disease stage IV, and chronic venous insufficiency/hypertension. Her most recent hemoglobin A1c was 8.5 on 09-29-2021. 11-04-2021 upon evaluation today patient appears to be doing well currently  in regard to her wound. She has been tolerating the dressing changes without complication. Fortunately I do not see any evidence of infection locally or systemically which is great news. 11-18-2021 upon evaluation today patient appears to be doing well currently in regard to her wound. She has been tolerating the dressing changes without complication. Fortunately there does not appear to be any evidence of  active infection locally or systemically at this time which is great news. No fevers, chills, nausea, vomiting, or diarrhea. Electronic Signature(s) Signed: 11/18/2021 2:15:07 PM By: Worthy Keeler PA-C Entered By: Worthy Keeler on 11/18/2021 14:15:06 Kelly [name], Kelly [name] ([id number]) -------------------------------------------------------------------------------- Physical Exam Details Patient Name: Kelly [name] Date of Service: 11/18/2021 11:15 AM Medical Record Number: [id number] Patient Account Number: [id number] Date of Birth/Sex: 1940/10/28 (81 y.o. F) Treating RN: Levora Dredge Primary Care Provider: Tommi Rumps Other Clinician: Referring Provider: Tommi Rumps Treating Provider/Extender: Skipper Cliche in Treatment: 3 Constitutional Well-nourished and well-hydrated in no acute distress. Respiratory normal breathing without difficulty. Psychiatric this patient is able to make decisions and demonstrates good insight into disease process. Alert and Oriented x 3. pleasant and cooperative. Notes Upon inspection patient's wound bed actually did require some sharp debridement of clearway some of the necrotic debris she tolerated that today without complication. This wound is very close to complete resolution I think the collagen is getting stuck to the wound which is part of what is causing the main issue here. She voiced an understanding of this and again we will get a do what we can to try to prevent that from being an ongoing issue here. Electronic  Signature(s) Signed: 11/18/2021 2:15:34 PM By: Worthy Keeler PA-C Entered By: Worthy Keeler on 11/18/2021 14:15:34 Kelly [name], Kelly [name] ([id number]) -------------------------------------------------------------------------------- Physician Orders Details Patient Name: Kelly [name] Date of Service: 11/18/2021 11:15 AM Medical Record Number: [id number] Patient Account Number: [id number] Date of Birth/Sex: 01-10-1941 (81 y.o. F) Treating RN: Levora Dredge Primary Care Provider: Tommi Rumps Other Clinician: Referring Provider: Tommi Rumps Treating Provider/Extender: Skipper Cliche in Treatment: 3 Verbal / Phone Orders: No Diagnosis Coding ICD-10 Coding Code Description E11.622 Type 2 diabetes mellitus with other skin ulcer L97.812 Non-pressure chronic ulcer of other part of right lower leg with fat layer exposed I10 Essential (primary) hypertension N18.4 Chronic kidney disease, stage 4 (severe) I87.331 Chronic venous hypertension (idiopathic) with ulcer and inflammation of right lower extremity Follow-up Appointments o Return Appointment in 2 weeks. Bathing/ Shower/ Hygiene o May shower; gently cleanse wound with antibacterial soap, rinse and pat dry prior to dressing wounds Anesthetic (Use 'Patient Medications' Section for Anesthetic Order Entry) o Lidocaine applied to wound bed Edema Control - Lymphedema / Segmental Compressive Device / Other o Elevate, Exercise Daily and Avoid Standing for Long Periods of Time. o Elevate legs to the level of the heart and pump ankles as often as possible o Elevate leg(s) parallel to the floor when sitting. Wound Treatment Wound #1 - Lower Leg Wound Laterality: Right, Midline Primary Dressing: Xeroform 5x9-HBD (in/in) 3 x Per Week/15 Days Discharge Instructions: Apply Xeroform 5x9-HBD (in/in) as directed Add-Ons: Curity Flexible Adhesive Bandage, Xlarge, 2x3.25 (in/in) 3 x Per Week/15 Days Discharge  Instructions: 2x3.25 bandage Electronic Signature(s) Signed: 11/18/2021 2:50:17 PM By: Levora Dredge Signed: 11/18/2021 4:35:29 PM By: Worthy Keeler PA-C Entered By: Levora Dredge on 11/18/2021 11:35:52 Kelly [name], Kelly [name] ([id number]) -------------------------------------------------------------------------------- Problem List Details Patient Name: Kelly [name] Date of Service: 11/18/2021 11:15 AM Medical Record Number: [id number] Patient Account Number: [id number] Date of Birth/Sex: Apr 11, 1941 (81 y.o. F) Treating RN: Levora Dredge Primary Care Provider: Tommi Rumps Other Clinician: Referring Provider: Tommi Rumps Treating Provider/Extender: Skipper Cliche in Treatment: 3 Active Problems ICD-10 Encounter Code Description Active Date MDM Diagnosis E11.622 Type 2 diabetes mellitus with other skin ulcer 10/28/2021 No Yes L97.812 Non-pressure chronic ulcer of other part of right lower leg  with fat layer 10/28/2021 No Yes exposed [place] (primary) hypertension 10/28/2021 No Yes N18.4 Chronic kidney disease, stage 4 (severe) 10/28/2021 No Yes I87.331 Chronic venous hypertension (idiopathic) with ulcer and inflammation of 10/28/2021 No Yes right lower extremity Inactive Problems Resolved Problems Electronic Signature(s) Signed: 11/18/2021 11:23:01 AM By: Worthy Keeler PA-C Entered By: Worthy Keeler on 11/18/2021 11:23:00 Kelly [name], Kelly [name] ([id number]) -------------------------------------------------------------------------------- Progress Note Details Patient Name: Kelly [name] Date of Service: 11/18/2021 11:15 AM Medical Record Number: [id number] Patient Account Number: [id number] Date of Birth/Sex: [birth year] (81 y.o. F) Treating RN: Levora Dredge Primary Care Provider: Tommi Rumps Other Clinician: Referring Provider: Tommi Rumps Treating Provider/Extender: Skipper Cliche in Treatment: 3 Subjective Chief  Complaint Information obtained from Patient Right LE Ulcer History of Present Illness (HPI) 10-28-2021 upon evaluation today patient appears to be doing somewhat poorly in regard to a wound on her right leg. This happened when she had a concrete birdbath fall and hit her leg on May 16. She went to urgent care 2 days later since that time she has been on 2 rounds of doxycycline as well as a round of clindamycin the good news is I see no evidence of infection at this point. Fortunately there is no signs of active infection locally or systemically which is great news and overall I think you are on the right track as far as healing is concerned I do believe that there are some things we can do to help her out here. The patient does have a history of diabetes mellitus type 2, hypertension, chronic kidney disease stage IV, and chronic venous insufficiency/hypertension. Her most recent hemoglobin A1c was 8.5 on 09-29-2021. 11-04-2021 upon evaluation today patient appears to be doing well currently in regard to her wound. She has been tolerating the dressing changes without complication. Fortunately I do not see any evidence of infection locally or systemically which is great news. 11-18-2021 upon evaluation today patient appears to be doing well currently in regard to her wound. She has been tolerating the dressing changes without complication. Fortunately there does not appear to be any evidence of active infection locally or systemically at this time which is great news. No fevers, chills, nausea, vomiting, or diarrhea. Objective Constitutional Well-nourished and well-hydrated in no acute distress. Vitals Time Taken: 11:16 AM, Height: 64 in, Weight: 118 lbs, BMI: 20.3, Temperature: 97.7 F, Pulse: 65 bpm, Respiratory Rate: 18 breaths/min, Blood Pressure: 172/76 mmHg. Respiratory normal breathing without difficulty. Psychiatric this patient is able to make decisions and demonstrates good insight into  disease process. Alert and Oriented x 3. pleasant and cooperative. General Notes: Upon inspection patient's wound bed actually did require some sharp debridement of clearway some of the necrotic debris she tolerated that today without complication. This wound is very close to complete resolution I think the collagen is getting stuck to the wound which is part of what is causing the main issue here. She voiced an understanding of this and again we will get a do what we can to try to prevent that from being an ongoing issue here. Integumentary (Hair, Skin) Wound #1 status is Open. Original cause of wound was Trauma. The date acquired was: 09/21/2021. The wound has been in treatment 3 weeks. The wound is located on the Right,Midline Lower Leg. The wound measures 0.1cm length x 0.1cm width x 0.1cm depth; 0.008cm^2 area and 0.001cm^3 volume. There is no tunneling or undermining noted. There is a medium amount of serous drainage noted. The wound  margin is thickened. There is no granulation within the wound bed. There is a medium (34-66%) amount of necrotic tissue within the wound bed including Adherent Slough. Assessment Kelly [name], Kelly [name] ([id number]) Active Problems ICD-10 Type 2 diabetes mellitus with other skin ulcer Non-pressure chronic ulcer of other part of right lower leg with fat layer exposed Essential (primary) hypertension Chronic kidney disease, stage 4 (severe) Chronic venous hypertension (idiopathic) with ulcer and inflammation of right lower extremity Procedures Wound #1 Pre-procedure diagnosis of Wound #1 is a Diabetic Wound/Ulcer of the Lower Extremity located on the Right,Midline Lower Leg .Severity of Tissue Pre Debridement is: Fat layer exposed. There was a Excisional Skin/Subcutaneous Tissue Debridement with a total area of 0.01 sq cm performed by Tommie Sams., PA-C. With the following instrument(s): Curette to remove Viable and Non-Viable tissue/material. Material  removed includes Subcutaneous Tissue and Slough and after achieving pain control using Lidocaine 4% Topical Solution. No specimens were taken. A time out was conducted at 11:30, prior to the start of the procedure. A Minimum amount of bleeding was controlled with Pressure. The procedure was tolerated well. Post Debridement Measurements: 0.1cm length x 0.1cm width x 0.1cm depth; 0.001cm^3 volume. Character of Wound/Ulcer Post Debridement is stable. Severity of Tissue Post Debridement is: Fat layer exposed. Post procedure Diagnosis Wound #1: Same as Pre-Procedure Plan Follow-up Appointments: Return Appointment in 2 weeks. Bathing/ Shower/ Hygiene: May shower; gently cleanse wound with antibacterial soap, rinse and pat dry prior to dressing wounds Anesthetic (Use 'Patient Medications' Section for Anesthetic Order Entry): Lidocaine applied to wound bed Edema Control - Lymphedema / Segmental Compressive Device / Other: Elevate, Exercise Daily and Avoid Standing for Long Periods of Time. Elevate legs to the level of the heart and pump ankles as often as possible Elevate leg(s) parallel to the floor when sitting. WOUND #1: - Lower Leg Wound Laterality: Right, Midline Primary Dressing: Xeroform 5x9-HBD (in/in) 3 x Per Week/15 Days Discharge Instructions: Apply Xeroform 5x9-HBD (in/in) as directed Add-Ons: Curity Flexible Adhesive Bandage, Xlarge, 2x3.25 (in/in) 3 x Per Week/15 Days Discharge Instructions: 2x3.25 bandage 1. Would recommend currently that we go ahead and continue with the wound care measures as before and the patient is in agreement with plan. This includes the use of the Xeroform gauze dressing which I think is good to be better for her currently than the silver collagen. 2. I am also can recommend that we continue with the coverlet to cover over this for protection I think this should do quite well. We will see patient back for reevaluation in 2 weeks here in the clinic. If  anything worsens or changes patient will contact our office for additional recommendations. Electronic Signature(s) Signed: 11/18/2021 2:16:12 PM By: Worthy Keeler PA-C Entered By: Worthy Keeler on 11/18/2021 14:16:12 Kelly [name], Kelly [name] ([id number]) -------------------------------------------------------------------------------- SuperBill Details Patient Name: Kelly [name] Date of Service: 11/18/2021 Medical Record Number: [id number] Patient Account Number: [id number] Date of Birth/Sex: 23-Jul-1940 (81 y.o. F) Treating RN: Levora Dredge Primary Care Provider: Tommi Rumps Other Clinician: Referring Provider: Tommi Rumps Treating Provider/Extender: Skipper Cliche in Treatment: 3 Diagnosis Coding ICD-10 Codes Code Description [phone] Type 2 diabetes mellitus with other skin ulcer L97.812 Non-pressure chronic ulcer of other part of right lower leg with fat layer exposed I10 Essential (primary) hypertension N18.4 Chronic kidney disease, stage 4 (severe) I87.331 Chronic venous hypertension (idiopathic) with ulcer and inflammation of right lower extremity Facility Procedures CPT4 Code: [id number] Description: 57262 - DEB SUBQ TISSUE 20 SQ CM/<  Modifier: Quantity: 1 CPT4 Code: Description: ICD-10 Diagnosis Description B84.784 Non-pressure chronic ulcer of other part of right lower leg with fat lay Modifier: er exposed Quantity: Physician Procedures CPT4 Code: 1282081 Description: 11042 - WC PHYS SUBQ TISS 20 SQ CM Modifier: Quantity: 1 CPT4 Code: Description: ICD-10 Diagnosis Description N88.719 Non-pressure chronic ulcer of other part of right lower leg with fat lay Modifier: er exposed Quantity: Electronic Signature(s) Signed: 11/18/2021 2:16:19 PM By: Worthy Keeler PA-C Entered By: Worthy Keeler on 11/18/2021 14:16:19

## 2021-11-26 ENCOUNTER — Other Ambulatory Visit (INDEPENDENT_AMBULATORY_CARE_PROVIDER_SITE_OTHER): Payer: Medicare Other

## 2021-11-26 DIAGNOSIS — D696 Thrombocytopenia, unspecified: Secondary | ICD-10-CM

## 2021-11-26 LAB — CBC
HCT: 38.6 % (ref 36.0–46.0)
Hemoglobin: 12.7 g/dL (ref 12.0–15.0)
MCHC: 32.9 g/dL (ref 30.0–36.0)
MCV: 93.7 fl (ref 78.0–100.0)
Platelets: 170 10*3/uL (ref 150.0–400.0)
RBC: 4.12 Mil/uL (ref 3.87–5.11)
RDW: 14.9 % (ref 11.5–15.5)
WBC: 4.8 10*3/uL (ref 4.0–10.5)

## 2021-11-29 ENCOUNTER — Telehealth: Payer: Self-pay | Admitting: Family Medicine

## 2021-11-29 LAB — PTH, INTACT AND CALCIUM
Calcium: 8.6 mg/dL (ref 8.6–10.4)
PTH: 63 pg/mL (ref 16–77)

## 2021-11-29 NOTE — Telephone Encounter (Signed)
Noted. Please call and find out what her concerns are. Thanks.

## 2021-11-29 NOTE — Telephone Encounter (Signed)
The daughter wants to discuss the lab work that was done on 11/26/21. She has some concerns regarding the labs and swelling and tenderness in her legs.

## 2021-11-30 NOTE — Telephone Encounter (Signed)
I spoke with patient's daughter in depth & she is concerned that patient's left leg is still swollen. Leg is swollen from the knee down with a lot of tenderness with a small red patch on it about the size of daughter's pinky finger nail . She just worried about an infection & getting into patient's blood stream. Her right left where wound was has healed nicely. She said that she tried to do pressure test on right leg & was painful that he mom could take the pressure. She is taking her furosemide daily but swelling persists. She said that this is been going on someday's worse since she saw you on 7/3. She felt that you actually saw patient in a good day with less swelling. I have patient scheduled tomorrow at 1:30p so that you can look at patient's leg since daughter is worried. She was told that if any worsening sx before tomorrow she needed to go to [facility] or ED ASAP. I also reviewed labs & what you were looking for in 7/21 recheck.

## 2021-11-30 NOTE — Telephone Encounter (Signed)
Noted.  Thanks for calling her.  I agree with the need for evaluation.  If there are any changes in her symptoms she needs to be seen today at urgent care or the emergency department.

## 2021-12-01 ENCOUNTER — Other Ambulatory Visit: Payer: Self-pay | Admitting: Family Medicine

## 2021-12-01 ENCOUNTER — Ambulatory Visit (INDEPENDENT_AMBULATORY_CARE_PROVIDER_SITE_OTHER): Payer: Medicare Other | Admitting: Family Medicine

## 2021-12-01 ENCOUNTER — Ambulatory Visit
Admission: RE | Admit: 2021-12-01 | Discharge: 2021-12-01 | Disposition: A | Discharge status: Home / Self Care | Payer: Medicare Other | Source: Ambulatory Visit | Attending: Family Medicine | Admitting: Family Medicine

## 2021-12-01 ENCOUNTER — Encounter: Payer: Self-pay | Admitting: Family Medicine

## 2021-12-01 DIAGNOSIS — M7989 Other specified soft tissue disorders: Secondary | ICD-10-CM | POA: Diagnosis not present

## 2021-12-01 DIAGNOSIS — M79661 Pain in right lower leg: Secondary | ICD-10-CM | POA: Diagnosis not present

## 2021-12-01 DIAGNOSIS — M79662 Pain in left lower leg: Secondary | ICD-10-CM | POA: Diagnosis not present

## 2021-12-01 MED ORDER — TRIAMCINOLONE ACETONIDE 0.1 % EX OINT: 1.0000 | TOPICAL_OINTMENT | Freq: Two times a day (BID) | CUTANEOUS | 0 refills | Status: DC | PRN

## 2021-12-01 NOTE — Progress Notes (Signed)
Kelly Rumps, MD Phone: [phone]  Kelly [name] is a 81 y.o. female who presents today for f/u.  Leg swelling: Patient has had bilateral leg swelling over the last month.  Left greater than right recently.  She notes erythematous blotches on her lower legs.  She notes the swelling is below her knees bilaterally.  Notes her legs are sore to touch with this.  The swelling does not resolve overnight.  Notes Lasix was minimally beneficial.  She notes no orthopnea, PND, or shortness of breath.  She had labs earlier this month that indicated relatively stable kidney function as well as acceptable liver enzymes, protein levels, and thyroid function as well as hemoglobin levels.  Social History   Tobacco Use  Smoking Status Never  Smokeless Tobacco Never    Current Outpatient Medications on File Prior to Visit  Medication Sig Dispense Refill   amLODipine (NORVASC) 5 MG tablet TAKE 1 TABLET DAILY 90 tablet 3   aspirin EC 81 MG tablet Take 81 mg by mouth daily.     atorvastatin (LIPITOR) 80 MG tablet Take 1 tablet (80 mg total) by mouth daily. 90 tablet 3   BD PEN NEEDLE NANO 2ND GEN 32G X 4 MM MISC USE DAILY WITH VICTOZA AND BASAGLAR 180 each 0   calcitRIOL (ROCALTROL) 0.25 MCG capsule Take 0.25 mcg by mouth daily.     Cholecalciferol (VITAMIN D) 50 MCG (2000 UT) CAPS Take 2,000 Units by mouth daily.      clindamycin (CLEOCIN) 300 MG capsule Take 1 capsule (300 mg total) by mouth in the morning and at bedtime. With food 14 capsule 0   Continuous Blood Gluc Sensor (FREESTYLE LIBRE 2 SENSOR) MISC USE TO CHECK SUGAR AT LEAST4 TIMES A DAY 6 each 3   cyclobenzaprine (FLEXERIL) 10 MG tablet TAKE 1/2 TABLET ('5MG'$  TOTAL)3 TIMES A DAY AS NEEDED    FOR MUSCLE SPASMS 45 tablet 0   denosumab (PROLIA) 60 MG/ML SOSY injection Inject 60 mg into the skin every 6 (six) months.     furosemide (LASIX) 40 MG tablet Take 40 mg by mouth daily as needed.     gabapentin (NEURONTIN) 100 MG capsule Take 1  capsule (100 mg total) by mouth 3 (three) times daily. 180 capsule 1   glucosamine-chondroitin 500-400 MG tablet Take 1 tablet by mouth 3 (three) times daily.     glucose blood (ACCU-CHEK AVIVA PLUS) test strip Use to check blood sugar up to 4 times daily 100 each 3   HYDROcodone-acetaminophen (NORCO/VICODIN) 5-325 MG tablet Take 1 tablet by mouth every 4 (four) hours as needed.     Insulin Glargine (BASAGLAR KWIKPEN) 100 UNIT/ML Inject 6 Units into the skin daily. 15 mL 1   Lancets MISC Use up to 4 times daily to check blood sugars. 200 each 11   liraglutide (VICTOZA) 18 MG/3ML SOPN Inject 1.8 mg into the skin daily. 27 mL 1   losartan (COZAAR) 50 MG tablet TAKE 1 TABLET DAILY 90 tablet 1   Melatonin 5 MG CHEW Chew 5 mg by mouth daily.     mupirocin ointment (BACTROBAN) 2 % Apply 1 application. topically 2 (two) times daily. Right leg wound 30 g 0   potassium chloride (K-DUR) 10 MEQ tablet Take 10 mEq by mouth daily as needed.     sertraline (ZOLOFT) 50 MG tablet Take 1 tablet (50 mg total) by mouth daily. 90 tablet 2   Current Facility-Administered Medications on File Prior to Visit  Medication Dose Route  Frequency Provider Last Rate Last Admin   denosumab (PROLIA) injection 60 mg  60 mg Subcutaneous Q6 months [name], MD   60 mg at 04/05/21 1307     ROS see history of present illness  Objective  Physical Exam Vitals:   12/01/21 1339  BP: 120/70  Pulse: 72  Temp: 98.7 F (37.1 C)  SpO2: 93%    BP Readings from Last 3 Encounters:  12/01/21 120/70  11/08/21 128/76  11/01/21 (!) 146/70   Wt Readings from Last 3 Encounters:  12/01/21 126 lb 9.6 oz (57.4 kg)  11/08/21 126 lb 3.2 oz (57.2 kg)  10/05/21 122 lb 6.4 oz (55.5 kg)    Physical Exam Musculoskeletal:     Comments: Trace pitting edema right lower extremity to the mid shin, 1+ pitting edema left lower extremity to the mid shin, there are venous stasis dermatitis changes bilateral lower legs, patient is  tender throughout her bilateral lower legs      Assessment/Plan: Please see individual problem list.  Problem List Items Addressed This Visit     Leg swelling    Patient has leg swelling and discomfort in her bilateral lower legs.  Discussed getting an ultrasound to rule out DVT though this would seem less likely given that this is occurring bilaterally.  Discussed that this likely would be related to venous insufficiency.  If her DVT is negative we could potentially get her fitted for compression stockings.  Discussed topical steroid for stasis dermatitis if her ultrasound is negative for DVT.      Relevant Orders   US Venous Img Lower Bilateral    Return if symptoms worsen or fail to improve.   Kelly Rumps, MD [place]

## 2021-12-01 NOTE — Patient Instructions (Signed)
Nice to see you. Lets see what your ultrasound reveals.  If there is no blood clot we will try to get you fitted for compression stockings and treat your skin findings with a topical steroid.

## 2021-12-01 NOTE — Assessment & Plan Note (Signed)
Patient has leg swelling and discomfort in her bilateral lower legs.  Discussed getting an ultrasound to rule out DVT though this would seem less likely given that this is occurring bilaterally.  Discussed that this likely would be related to venous insufficiency.  If her DVT is negative we could potentially get her fitted for compression stockings.  Discussed topical steroid for stasis dermatitis if her ultrasound is negative for DVT.

## 2021-12-03 ENCOUNTER — Ambulatory Visit
Admission: RE | Admit: 2021-12-03 | Discharge: 2021-12-03 | Disposition: A | Discharge status: Home / Self Care | Payer: Medicare Other | Source: Ambulatory Visit

## 2021-12-03 ENCOUNTER — Ambulatory Visit: Payer: Medicare Other | Admitting: Family Medicine

## 2021-12-03 VITALS — BP 167/84 | HR 62 | Temp 98.7°F | Resp 16

## 2021-12-03 DIAGNOSIS — S91302A Unspecified open wound, left foot, initial encounter: Secondary | ICD-10-CM | POA: Diagnosis not present

## 2021-12-03 DIAGNOSIS — M7989 Other specified soft tissue disorders: Secondary | ICD-10-CM | POA: Diagnosis not present

## 2021-12-03 DIAGNOSIS — I10 Essential (primary) hypertension: Secondary | ICD-10-CM

## 2021-12-03 NOTE — ED Provider Notes (Signed)
[name]    CSN: [id number] Arrival date & time: 12/03/21  1103      History   Chief Complaint Chief Complaint  Patient presents with   Leg Pain    HPI Kelly [name] is a 81 y.o. female.  Accompanied by her daughter, patient presents with swelling of her lower legs.  Her symptoms were worse yesterday and have improved today.  Yesterday she had tenderness to touch in her left lower leg; she took Lasix to decrease the swelling and has improved today.  She also has a sore on the top of her left foot from her shoes rubbing the area; it has mild redness around it.  She denies fever, chills, drainage, or other symptoms.  Patient was seen by her PCP on 12/01/2021; diagnosed with leg swelling; ultrasound ordered and negative for DVT; discussed being fitted for compression stockings; treated skin changes on shins with topical steroid.  The history is provided by the patient, a relative and medical records.    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     Patient Active Problem List   Diagnosis Date Noted   Leg swelling 12/01/2021   Arthralgia 11/08/2021   Skin tear of lower leg without complication, initial encounter 10/01/2021   Constipation 07/02/2021   Elevated lipase 07/02/2021   Thrombocytopenia ([place]) 07/02/2021   Hair loss 07/02/2021   Soft tissue lesion 07/02/2021   Anxiety 06/11/2021   Trochanteric bursitis, right hip 12/08/2020   Foraminal stenosis of lumbosacral region 12/08/2020   Lumbar herniated disc 12/08/2020   Lumbar radiculopathy 12/02/2020   Rash 09/29/2020   Onychomycosis 08/11/2020   Sleeping difficulty 08/11/2020   Allergic rhinitis 08/11/2020   CPAP use counseling  06/01/2020   Dysuria 03/02/2020   Edema of lower extremity 11/19/2019   Hyperlipidemia 11/15/2019   Low back pain 11/15/2019   Dry eyes 10/01/2019   Tick bite 10/01/2019   Personal history of other specified conditions 07/03/2019   Fall 04/15/2019   Type 2 diabetes mellitus ([place]) 03/18/2019   Hypertension 03/18/2019   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   Headache disorder 09/10/2018   Cervico-occipital neuralgia 07/23/2018   Osteoporosis 07/23/2018   Cramp and spasm 02/12/2018   Paresthesia of hand 10/25/2017   Lesion of rectum 09/07/2017   Loss of height 08/15/2017   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   Cervical spondylosis without myelopathy 08/15/2017   DDD (degenerative disc disease), cervical 08/15/2017   Bursitis of hip 08/15/2017   Female stress incontinence 08/15/2017   Muscle weakness 08/15/2017   Neck pain 08/15/2017   Spondylolisthesis, congenital 08/15/2017   Radial styloid tenosynovitis 08/15/2017   Senile osteoporosis 08/15/2017   Hip pain 08/15/2017   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   Rectal hemorrhage 07/25/2017   Essential tremor 04/26/2017   Other fatigue 03/22/2017   Left ventricular hypertrophy 10/12/2016   Chronic kidney disease, stage 4 (severe) ([place]) 08/26/2016   OSA on CPAP 08/26/2016    Past Surgical History:  Procedure Laterality Date   APPENDECTOMY     BREAST BIOPSY Right 06/19/2018   affirm stereo/x clip/COLUMNAR CELL CHANGE WITH MICROCALCIFICATIONS. FIBROCYSTIC  CHANGES WITH MICROCALCIFICATIONS   cataract  03/2017   CHOLECYSTECTOMY     COLONOSCOPY     COLONOSCOPY WITH PROPOFOL N/A 08/22/2017   Procedure: COLONOSCOPY WITH PROPOFOL;  Surgeon: Jonathon Bellows, MD;  Location: [facility] ENDOSCOPY;  Service: Gastroenterology;  Laterality: N/A;   ESOPHAGOGASTRODUODENOSCOPY     ESOPHAGOGASTRODUODENOSCOPY (EGD) WITH PROPOFOL N/A 03/23/2016   Procedure: ESOPHAGOGASTRODUODENOSCOPY (EGD) WITH PROPOFOL;  Surgeon:  Manya Silvas, MD;  Location: [facility] ENDOSCOPY;  Service: Endoscopy;  Laterality: N/A;   FLEXIBLE SIGMOIDOSCOPY N/A 02/06/2018   Procedure: FLEXIBLE SIGMOIDOSCOPY;  Surgeon: Jonathon Bellows, MD;  Location: [facility] ENDOSCOPY;  Service: Gastroenterology;  Laterality: N/A;   FLEXIBLE SIGMOIDOSCOPY N/A 12/03/2018   Procedure: FLEXIBLE SIGMOIDOSCOPY;  Surgeon: Jonathon Bellows, MD;  Location: [facility] ENDOSCOPY;  Service: Gastroenterology;  Laterality: N/A;   RECTAL PROLAPSE REPAIR  2012   x2    OB History   No obstetric history on file.      Home Medications    Prior to Admission medications   Medication Sig Start Date End Date Taking? Authorizing Provider  amLODipine (NORVASC) 5 MG tablet TAKE 1 TABLET DAILY 02/05/21  Yes [name], MD  aspirin EC 81 MG tablet Take 81 mg by mouth daily.   Yes [provider]  atorvastatin (LIPITOR) 80 MG tablet Take 1 tablet (80 mg total) by mouth daily. 10/14/20  Yes [name], MD  calcitRIOL (ROCALTROL) 0.25 MCG capsule Take 0.25 mcg by mouth daily.   Yes [provider]  Cholecalciferol (VITAMIN D) 50 MCG (2000 UT) CAPS Take 2,000 Units by mouth daily.    Yes [provider]  furosemide (LASIX) 40 MG tablet Take 40 mg by mouth daily as needed.   Yes [provider]  gabapentin (NEURONTIN) 100 MG capsule Take 1 capsule (100 mg total) by mouth 3 (three) times daily. 12/02/20  Yes [name], MD  glucosamine-chondroitin 500-400 MG tablet Take 1 tablet by mouth 3 (three) times daily.   Yes [provider]  HYDROcodone-acetaminophen (NORCO/VICODIN) 5-325 MG tablet Take 1 tablet by mouth every 4 (four) hours as needed.   Yes [provider]  Insulin Glargine (BASAGLAR KWIKPEN) 100 UNIT/ML Inject 6 Units into the skin daily. 11/08/21  Yes [name], MD  liraglutide (VICTOZA) 18 MG/3ML SOPN Inject 1.8 mg into the skin daily. 10/01/21  Yes [name], MD  losartan (COZAAR) 50 MG tablet TAKE  1 TABLET DAILY 05/31/21  Yes [name], MD  Melatonin 5 MG CHEW Chew 5 mg by mouth daily.   Yes [provider]  potassium chloride (K-DUR) 10 MEQ tablet Take 10 mEq by mouth daily as needed.   Yes [provider]  sertraline (ZOLOFT) 50 MG tablet Take 1 tablet (50 mg total) by mouth daily. 09/03/21  Yes [name], MD  triamcinolone ointment (KENALOG) 0.1 % Apply 1 Application topically 2 (two) times daily as needed (rash). 12/01/21  Yes [name], MD  BD PEN NEEDLE NANO 2ND GEN 32G X 4 MM MISC USE DAILY WITH VICTOZA AND BASAGLAR 11/02/21   [name], MD  clindamycin (CLEOCIN) 300 MG capsule Take 1 capsule (300 mg total) by mouth in the morning and at bedtime. With food 10/15/21   McLean-Scocuzza, Nino Glow, MD  Continuous Blood Gluc Sensor (FREESTYLE LIBRE 2 SENSOR) MISC USE TO CHECK SUGAR AT LEAST4 TIMES A DAY 09/27/21   [name], MD  cyclobenzaprine (FLEXERIL) 10 MG tablet TAKE 1/2 TABLET ('5MG'$   TOTAL)3 TIMES A DAY AS NEEDED    FOR MUSCLE SPASMS 01/28/20   [name], MD  denosumab (PROLIA) 60 MG/ML SOSY injection Inject 60 mg into the skin every 6 (six) months.    [provider]  glucose blood (ACCU-CHEK AVIVA PLUS) test strip Use to check blood sugar up to 4 times daily 07/30/18   [name], MD  Lancets MISC Use up to 4 times daily to check blood sugars. 11/04/16   Coral Spikes, DO  mupirocin ointment (BACTROBAN) 2 % Apply 1 application. topically 2 (two) times daily. Right leg wound 10/05/21   McLean-Scocuzza, Nino Glow, MD    Family History Family History  Problem Relation Age of Onset   Stroke Mother    Arthritis Mother    Aortic aneurysm Sister    Lung cancer Sister    Heart attack Father 17   Breast cancer Sister 78    Social History Social History   Tobacco Use   Smoking status: Never   Smokeless tobacco: Never  Vaping Use   Vaping Use: Never used  Substance Use Topics   Alcohol use: No   Drug  use: No     Allergies   Prilosec otc [omeprazole magnesium], Sucralfate, Amoxicillin, and Morphine and related   Review of Systems Review of Systems  Constitutional:  Negative for chills and fever.  Musculoskeletal:  Negative for arthralgias, gait problem and joint swelling.  Skin:  Positive for color change and wound.  Neurological:  Negative for weakness and numbness.  All other systems reviewed and are negative.    Physical Exam Triage Vital Signs ED Triage Vitals  Enc Vitals Group     BP      Pulse      Resp      Temp      Temp src      SpO2      Weight      Height      Head Circumference      Peak Flow      Pain Score      Pain Loc      Pain Edu?      Excl. in [place]?    No data found.  Updated Vital Signs BP (!) 167/84 (BP Location: Right Arm)   Pulse 62   Temp 98.7 F (37.1 C) (Oral)   Resp 16   SpO2 97%   Visual Acuity Right Eye Distance:   Left Eye Distance:   Bilateral Distance:    Right Eye Near:   Left Eye Near:    Bilateral Near:     Physical Exam Vitals and nursing note reviewed.  Constitutional:      General: She is not in acute distress.    Appearance: Normal appearance. She is well-developed. She is not ill-appearing.  HENT:     Mouth/Throat:     Mouth: Mucous membranes are moist.  Cardiovascular:     Rate and Rhythm: Normal rate and regular rhythm.     Heart sounds: Normal heart sounds.  Pulmonary:     Effort: Pulmonary effort is normal. No respiratory distress.     Breath sounds: Normal breath sounds.  Musculoskeletal:        General: Swelling and tenderness present. No deformity. Normal range of motion.     Cervical back: Neck supple.       Feet:     Comments: Left lower leg mildly tender to palpation.  Hemosiderin changes noted.  No  open wounds, erythema, or drainage.  1+ nonpitting edema of both feet to ankle.  Trace nonpitting edema of left lower leg to knee.  Skin:    General: Skin is warm and dry.     Capillary Refill:  Capillary refill takes less than 2 seconds.     Findings: Erythema, lesion and rash present. No bruising.  Neurological:     General: No focal deficit present.     Mental Status: She is alert and oriented to person, place, and time.     Sensory: No sensory deficit.     Motor: No weakness.     Gait: Gait normal.  Psychiatric:        Mood and Affect: Mood normal.        Behavior: Behavior normal.      UC Treatments / Results  Labs (all labs ordered are listed, but only abnormal results are displayed) Labs Reviewed - No data to display  EKG   Radiology US Venous Img Lower Bilateral  Result Date: 12/01/2021 CLINICAL DATA:  Swelling x1 month, bilateral calf pain EXAM: BILATERAL LOWER EXTREMITY VENOUS DOPPLER ULTRASOUND TECHNIQUE: Gray-scale sonography with compression, as well as color and duplex ultrasound, were performed to evaluate the deep venous system(s) from the level of the common femoral vein through the popliteal and proximal calf veins. COMPARISON:  None Available. FINDINGS: VENOUS Normal compressibility of the common femoral, superficial femoral, and popliteal veins, as well as the visualized calf veins. Visualized portions of profunda femoral vein and great saphenous vein unremarkable. No filling defects to suggest DVT on grayscale or color Doppler imaging. Doppler waveforms show normal direction of venous flow, normal respiratory plasticity and response to augmentation. OTHER None. Limitations: none IMPRESSION: Negative. Electronically Signed   By: Lucrezia Europe M.D.   On: 12/01/2021 15:43    Procedures Procedures (including critical care time)  Medications Ordered in UC Medications - No data to display  Initial Impression / Assessment and Plan / UC Course  I have reviewed the triage vital signs and the nursing notes.  Pertinent labs & imaging results that were available during my care of the patient were reviewed by me and considered in my medical decision making (see  chart for details).  Wound on left foot, leg swelling.  Elevated blood pressure reading with hypertension.  Discussed wound care for the sore on her left foot.  Discussed symptomatic treatment of her edema including elevation.  She is prescribed Lasix her PCP to take as needed for edema; She took a dose yesterday and her symptoms have improved today.  Instructed patient to follow-up with her PCP.  Also discussed with patient that her blood pressure is elevated today and needs to be rechecked by PCP in 2 to 4 weeks.  Education provided on managing hypertension.  She agrees to plan of care.   Final Clinical Impressions(s) / UC Diagnoses   Final diagnoses:  Wound of left foot  Leg swelling  Elevated blood pressure reading in office with diagnosis of hypertension     Discharge Instructions      Keep your wound clean and dry.  Wash it gently twice a day with soap and water.  Apply an antibiotic cream twice a day.    Keep your feet elevated as you are able.  Follow up with your primary care provider if your symptoms are not improving.     Your blood pressure is elevated today at 167/84.  Please have this rechecked by your primary care provider  in 2-4 weeks.          ED Prescriptions   None    PDMP not reviewed this encounter.   Sharion Balloon, NP 12/03/21 1200

## 2021-12-03 NOTE — Discharge Instructions (Addendum)
Keep your wound clean and dry.  Wash it gently twice a day with soap and water.  Apply an antibiotic cream twice a day.    Keep your feet elevated as you are able.  Follow up with your primary care provider if your symptoms are not improving.     Your blood pressure is elevated today at 167/84.  Please have this rechecked by your primary care provider in 2-4 weeks.

## 2021-12-03 NOTE — ED Triage Notes (Addendum)
Patient c/o bilateral leg pain x 1 month.   Patient denies pain with ambulation.   Patient endorses lower extermity swelling at times.   Patient endorses a new blister present on Lft foot.   Patient endorses worsening pain with flexing at ankle and pain upon palpation.   Patient had a recent US that didn't show any DVT per patient statement.   History of DM.   Patient has taken Hydrocodone and topical steroid with no relief of symptoms.

## 2021-12-06 ENCOUNTER — Telehealth: Payer: Self-pay | Admitting: Family Medicine

## 2021-12-06 NOTE — Telephone Encounter (Signed)
Pt called stating the provider was discussing compression socks and she thinks she has to get measured for it. Pt would like to be called

## 2021-12-06 NOTE — Telephone Encounter (Signed)
I called to [facility] and they stated they will measure the patient for compression socks, she can just walk in and the insurance would not cover and her out of pocket expense would be from $45-$55, I called and notified the patient and she understood.  Tauno Falotico,cma

## 2021-12-09 ENCOUNTER — Ambulatory Visit (INDEPENDENT_AMBULATORY_CARE_PROVIDER_SITE_OTHER): Payer: Medicare Other

## 2021-12-09 VITALS — Ht 63.0 in | Wt 126.0 lb

## 2021-12-09 DIAGNOSIS — Z0000 Encounter for general adult medical examination without abnormal findings: Secondary | ICD-10-CM

## 2021-12-09 NOTE — Patient Instructions (Addendum)
  Ms. Earll , Thank you for taking time to come for your Medicare Wellness Visit. I appreciate your ongoing commitment to your health goals. Please review the following plan we discussed and let me know if I can assist you in the future.   These are the goals we discussed:  Goals      DIET - REDUCE SUGAR INTAKE     Low carb diet Stop drinking soda        This is a list of the screening recommended for you and due dates:  Health Maintenance  Topic Date Due   Flu Shot  12/07/2021   COVID-19 Vaccine (5 - Booster for Moderna series) 12/25/2021*   Hemoglobin A1C  04/01/2022   Eye exam for diabetics  04/05/2022   Complete foot exam   07/02/2022   Tetanus Vaccine  02/25/2029   Pneumonia Vaccine  Completed   DEXA scan (bone density measurement)  Completed   Zoster (Shingles) Vaccine  Completed   HPV Vaccine  Aged Out  *Topic was postponed. The date shown is not the original due date.

## 2021-12-09 NOTE — Progress Notes (Addendum)
Subjective:   Kelly [name] is a 81 y.o. female who presents for Medicare Annual (Subsequent) preventive examination.  Review of Systems    No ROS.  Medicare Wellness Virtual Visit.  Visual/audio telehealth visit, UTA vital signs.   See social history for additional risk factors.   Cardiac Risk Factors include: advanced age (>10mn, >>7women);hypertension;diabetes mellitus     Objective:    Today's Vitals   12/09/21 1011  Weight: 126 lb (57.2 kg)  Height: '5\' 3"'$  (1.6 m)   Body mass index is 22.32 kg/m.     12/09/2021   10:17 AM 12/11/2020    5:10 PM 11/03/2020    9:21 AM 06/16/2020   11:09 AM 05/22/2020    9:30 AM 03/02/2020    6:26 PM 12/17/2019    1:47 PM  Advanced Directives  Does Patient Have a Medical Advance Directive? Yes Yes Yes Yes No No Yes  Type of AParamed[place]of [place] will  Living will;Healthcare Power of AFifty-SixLiving will   [place]Living will  Does patient want to make changes to medical advance directive? No - Patient declined   No - Patient declined     Copy of HE[place] Chart? No - copy requested  No - copy requested No - copy requested     Would patient like information on creating a medical advance directive?     No - Patient declined No - Patient declined    Current Medications (verified) Outpatient Encounter Medications as of 12/09/2021  Medication Sig   amLODipine (NORVASC) 5 MG tablet TAKE 1 TABLET DAILY   aspirin EC 81 MG tablet Take 81 mg by mouth daily.   atorvastatin (LIPITOR) 80 MG tablet Take 1 tablet (80 mg total) by mouth daily.   BD PEN NEEDLE NANO 2ND GEN 32G X 4 MM MISC USE DAILY WITH VICTOZA AND BASAGLAR   calcitRIOL (ROCALTROL) 0.25 MCG capsule Take 0.25 mcg by mouth daily.   Cholecalciferol (VITAMIN D) 50 MCG (2000 UT) CAPS Take 2,000 Units by mouth daily.    clindamycin (CLEOCIN) 300 MG capsule Take 1 capsule (300 mg total) by  mouth in the morning and at bedtime. With food   Continuous Blood Gluc Sensor (FREESTYLE LIBRE 2 SENSOR) MISC USE TO CHECK SUGAR AT LEAST4 TIMES A DAY   cyclobenzaprine (FLEXERIL) 10 MG tablet TAKE 1/2 TABLET ('5MG'$  TOTAL)3 TIMES A DAY AS NEEDED    FOR MUSCLE SPASMS   denosumab (PROLIA) 60 MG/ML SOSY injection Inject 60 mg into the skin every 6 (six) months.   furosemide (LASIX) 40 MG tablet Take 40 mg by mouth daily as needed.   gabapentin (NEURONTIN) 100 MG capsule Take 1 capsule (100 mg total) by mouth 3 (three) times daily.   glucosamine-chondroitin 500-400 MG tablet Take 1 tablet by mouth 3 (three) times daily.   glucose blood (ACCU-CHEK AVIVA PLUS) test strip Use to check blood sugar up to 4 times daily   HYDROcodone-acetaminophen (NORCO/VICODIN) 5-325 MG tablet Take 1 tablet by mouth every 4 (four) hours as needed.   Insulin Glargine (BASAGLAR KWIKPEN) 100 UNIT/ML Inject 6 Units into the skin daily.   Lancets MISC Use up to 4 times daily to check blood sugars.   liraglutide (VICTOZA) 18 MG/3ML SOPN Inject 1.8 mg into the skin daily.   losartan (COZAAR) 50 MG tablet TAKE 1 TABLET DAILY   Melatonin 5 MG CHEW Chew 5 mg by mouth daily.  mupirocin ointment (BACTROBAN) 2 % Apply 1 application. topically 2 (two) times daily. Right leg wound   potassium chloride (K-DUR) 10 MEQ tablet Take 10 mEq by mouth daily as needed.   sertraline (ZOLOFT) 50 MG tablet Take 1 tablet (50 mg total) by mouth daily.   triamcinolone ointment (KENALOG) 0.1 % Apply 1 Application topically 2 (two) times daily as needed (rash).   Facility-Administered Encounter Medications as of 12/09/2021  Medication   denosumab (PROLIA) injection 60 mg   Allergies (verified) Prilosec otc [omeprazole magnesium], Sucralfate, Amoxicillin, and Morphine and related   History: Past Medical History:  Diagnosis Date   Colon polyps    Depression    DM (diabetes mellitus), type 2 with renal complications ([place])    Hyperlipidemia     Hypertension    Kidney disease    Kidney stones    Shingles 12/05/2019   Sleep apnea    Squamous cell skin cancer 12/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    Past Surgical History:  Procedure Laterality Date   APPENDECTOMY     BREAST BIOPSY Right 06/19/2018   affirm stereo/x clip/COLUMNAR CELL CHANGE WITH MICROCALCIFICATIONS. FIBROCYSTIC CHANGES WITH MICROCALCIFICATIONS   cataract  03/2017   CHOLECYSTECTOMY     COLONOSCOPY     COLONOSCOPY WITH PROPOFOL N/A 08/22/2017   Procedure: COLONOSCOPY WITH PROPOFOL;  Surgeon: Jonathon Bellows, MD;  [facility] ENDOSCOPY;  Service: Gastroenterology;  Laterality: N/A;   ESOPHAGOGASTRODUODENOSCOPY     ESOPHAGOGASTRODUODENOSCOPY (EGD) WITH PROPOFOL N/A 03/23/2016   Procedure: ESOPHAGOGASTRODUODENOSCOPY (EGD) WITH PROPOFOL;  Surgeon: Manya Silvas, MD;  Location: [facility] ENDOSCOPY;  Service: Endoscopy;  Laterality: N/A;   FLEXIBLE SIGMOIDOSCOPY N/A 02/06/2018   Procedure: FLEXIBLE SIGMOIDOSCOPY;  Surgeon: Jonathon Bellows, MD;  Location[facility] ENDOSCOPY;  Service: Gastroenterology;  Laterality: N/A;   FLEXIBLE SIGMOIDOSCOPY N/A 12/03/2018   Procedure: FLEXIBLE SIGMOIDOSCOPY;  Surgeon: Jonathon Bellows, MD;  Location: [facility] ENDOSCOPY;  Service: Gastroenterology;  Laterality: N/A;   RECTAL PROLAPSE REPAIR  2012   x2   Family History  Problem Relation Age of Onset   Stroke Mother    Arthritis Mother    Aortic aneurysm Sister    Lung cancer Sister    Heart attack Father 71   Breast cancer Sister 57   Social History   Socioeconomic History   Marital status: Widowed    Spouse name: Not on file   Number of children: Not on file   Years of education: Not on file   Highest education level: High school graduate  Occupational History   Occupation: retired   Tobacco Use   Smoking status: Never   Smokeless tobacco: Never  Vaping Use   Vaping Use: Never used  Substance and Sexual Activity   Alcohol use: No   Drug use: No   Sexual activity: Not Currently     Partners: Male  Other Topics Concern   Not on file  Social History Narrative   Not on file   Social Determinants of Health   Financial Resource Strain: Low Risk  (06/10/2021)   Overall Financial Resource Strain (CARDIA)    Difficulty of Paying Living Expenses: Not hard at all  Food Insecurity: No Food Insecurity (11/03/2020)   Hunger Vital Sign    Worried About Running Out of Food in the Last Year: Never true    [place] in the Last Year: Never true  Transportation Needs: No Transportation Needs (11/03/2020)   PRAPARE - Hydrologist (Medical):  No    Lack of Transportation (Non-Medical): No  Physical Activity: Insufficiently Active (11/03/2020)   Exercise Vital Sign    Days of Exercise per Week: 3 days    Minutes of Exercise per Session: 20 min  Stress: No Stress Concern Present (11/03/2020)   [place]    Feeling of Stress : Only a little  Social Connections: Unknown (11/03/2020)   Social Connection and Isolation Panel [NHANES]    Frequency of Communication with Friends and Family: More than three times a week    Frequency of Social Gatherings with Friends and Family: Not on file    Attends Religious Services: Not on Advertising copywriter or Organizations: Not on file    Attends Archivist Meetings: Not on file    Marital Status: Not on file   Tobacco Counseling Counseling given: Not Answered  Clinical Intake: Pre-visit preparation completed: Yes        Diabetes: No  How often do you need to have someone help you when you read instructions, pamphlets, or other written materials from your doctor or pharmacy?: 1 - Never   Interpreter Needed?: No    Activities of Daily Living    12/09/2021   10:19 AM  In your present state of health, do you have any difficulty performing the following activities:  Hearing? 0  Vision? 0  Difficulty concentrating or  making decisions? 0  Walking or climbing stairs? 0  Dressing or bathing? 0  Doing errands, shopping? 0  Preparing Food and eating ? N  Using the Toilet? N  In the past six months, have you accidently leaked urine? N  Do you have problems with loss of bowel control? N  Managing your Medications? N  Managing your Finances? N  Housekeeping or managing your Housekeeping? N   Patient Care Team: [name], MD as PCP - General (Family Medicine) Murlean Iba, MD (Nephrology) Felipa Furnace, DPM as Consulting Physician (Podiatry)  Indicate any recent Medical Services you may have received from other than [facility] providers in the past year (date may be approximate).     Assessment:   This is a routine wellness examination for Kelly [name].  Virtual Visit via Telephone Note  I connected with  Kelly [name] on 12/09/21 at 10:00 AM EDT by telephone and verified that I am speaking with the correct person using two identifiers.  Location: Patient: home Provider: office Persons participating in the virtual visit: patient/Nurse Health Advisor   I discussed the limitations of performing an evaluation and management service by telehealth. Some vital signs may be absent or patient reported.   Hearing/Vision screen Hearing Screening - Comments:: Hearing aids Vision Screening - Comments:: Followed by [facility] Cataract extraction, bilateral  They have seen their ophthalmologist in the last 12 months.   Dietary issues and exercise activities discussed: Current Exercise Habits: Home exercise routine, Type of exercise: walking, Intensity: Mild Regular diet Good water intake   Goals Addressed             This Visit's Progress    DIET - REDUCE SUGAR INTAKE       Low carb diet Stop drinking soda       Depression Screen    12/09/2021   10:18 AM 11/08/2021    1:58 PM 10/05/2021    1:25 PM 10/01/2021    1:54 PM 07/02/2021   11:29 AM 12/02/2020  2:10 PM 11/03/2020     9:12 AM  PHQ 2/9 Scores  PHQ - 2 Score 0 0 0 0 0 0 0  PHQ- 9 Score 0 0  0       Fall Risk    12/09/2021   10:18 AM 11/08/2021    1:58 PM 10/05/2021    1:25 PM 10/01/2021    1:53 PM 07/02/2021   11:28 AM  [place] in the past year? 0 0 1 0 0  Number falls in past yr:  0 1  0  Injury with Fall? 0 0 0  0  Risk for fall due to :  No Fall Risks History of fall(s) No Fall Risks No Fall Risks  Follow up Falls evaluation completed Falls evaluation completed Falls evaluation completed Falls evaluation completed Falls evaluation completed    [place]: Home free of loose throw rugs in walkways, pet beds, electrical cords, etc? Yes  Adequate lighting in your home to reduce risk of falls? Yes   ASSISTIVE DEVICES UTILIZED TO PREVENT FALLS: Life alert? No  Use of a cane, walker or w/c? No   TIMED UP AND GO: Was the test performed? No .   Cognitive Function: Patient is alert and oriented x3.  Enjoys reading and playing brain games.         11/01/2019    9:16 AM 10/31/2018   10:50 AM 10/25/2017   12:10 PM  6CIT Screen  What Year? 0 points 0 points 0 points  What month? 0 points 0 points 0 points  What time? 0 points 0 points 0 points  Count back from 20 0 points 0 points 0 points  Months in reverse 0 points 0 points 0 points  Repeat phrase 0 points 0 points 0 points  Total Score 0 points 0 points 0 points    Immunizations Immunization History  Administered Date(s) Administered   Fluad Quad(high Dose 65+) 02/25/2019, 03/02/2020, 05/17/2021   Influenza, High Dose Seasonal PF 02/24/2017, 02/28/2018   Influenza,inj,quad, With Preservative 02/19/2016   Moderna Sars-Covid-2 Vaccination 07/05/2019, 08/12/2019, 03/02/2020, 08/10/2020   PNEUMOCOCCAL CONJUGATE-20 07/02/2021   Td 02/26/2019   Zoster Recombinat (Shingrix) 01/02/2020, 03/10/2020   Screening Tests Health Maintenance  Topic Date Due   INFLUENZA VACCINE  12/07/2021   COVID-19  Vaccine (5 - Booster for Moderna series) 12/25/2021 (Originally 10/05/2020)   HEMOGLOBIN A1C  04/01/2022   OPHTHALMOLOGY EXAM  04/05/2022   FOOT EXAM  07/02/2022   TETANUS/TDAP  02/25/2029   Pneumonia Vaccine 41+ Years old  Completed   DEXA SCAN  Completed   Zoster Vaccines- Shingrix  Completed   HPV VACCINES  Aged Out   Health Maintenance Health Maintenance Due  Topic Date Due   INFLUENZA VACCINE  12/07/2021   Lung Cancer Screening: (Low Dose CT Chest recommended if Age 40-80 years, 30 pack-year currently smoking OR have quit w/in 15years.) does not qualify.   Hepatitis C Screening: does not qualify.  Vision Screening: Recommended annual ophthalmology exams for early detection of glaucoma and other disorders of the eye.  Dental Screening: Recommended annual dental exams for proper oral hygiene  Community Resource Referral / Chronic Care Management: CRR required this visit?  No   CCM required this visit?  No      Plan:   Keep all routine maintenance appointments.   I have personally reviewed and noted the following in the patient's chart:   Medical and social history Use  of alcohol, tobacco or illicit drugs  Current medications and supplements including opioid prescriptions.  Functional ability and status Nutritional status Physical activity Advanced directives List of other physicians Hospitalizations, surgeries, and ER visits in previous 12 months Vitals Screenings to include cognitive, depression, and falls Referrals and appointments  In addition, I have reviewed and discussed with patient certain preventive protocols, quality metrics, and best practice recommendations. A written personalized care plan for preventive services as well as general preventive health recommendations were provided to patient.     OBrien-Blaney, Geoffry Bannister L, LPN   09/13/8467      I have reviewed the above information and agree with above.   Deborra Medina, MD

## 2021-12-14 DIAGNOSIS — M4312 Spondylolisthesis, cervical region: Secondary | ICD-10-CM | POA: Diagnosis not present

## 2021-12-14 DIAGNOSIS — M25572 Pain in left ankle and joints of left foot: Secondary | ICD-10-CM | POA: Diagnosis not present

## 2021-12-14 DIAGNOSIS — M542 Cervicalgia: Secondary | ICD-10-CM | POA: Diagnosis not present

## 2021-12-14 DIAGNOSIS — M545 Low back pain, unspecified: Secondary | ICD-10-CM | POA: Diagnosis not present

## 2021-12-14 DIAGNOSIS — Q762 Congenital spondylolisthesis: Secondary | ICD-10-CM | POA: Diagnosis not present

## 2021-12-14 DIAGNOSIS — T2101XA Burn of unspecified degree of chest wall, initial encounter: Secondary | ICD-10-CM | POA: Diagnosis not present

## 2021-12-14 DIAGNOSIS — M5032 Other cervical disc degeneration, mid-cervical region, unspecified level: Secondary | ICD-10-CM | POA: Diagnosis not present

## 2021-12-14 DIAGNOSIS — M5136 Other intervertebral disc degeneration, lumbar region: Secondary | ICD-10-CM | POA: Diagnosis not present

## 2021-12-14 DIAGNOSIS — Z5181 Encounter for therapeutic drug level monitoring: Secondary | ICD-10-CM | POA: Diagnosis not present

## 2021-12-14 DIAGNOSIS — Z79899 Other long term (current) drug therapy: Secondary | ICD-10-CM | POA: Diagnosis not present

## 2021-12-14 DIAGNOSIS — M6281 Muscle weakness (generalized): Secondary | ICD-10-CM | POA: Diagnosis not present

## 2021-12-14 DIAGNOSIS — E1165 Type 2 diabetes mellitus with hyperglycemia: Secondary | ICD-10-CM | POA: Diagnosis not present

## 2021-12-14 DIAGNOSIS — M419 Scoliosis, unspecified: Secondary | ICD-10-CM | POA: Diagnosis not present

## 2021-12-14 DIAGNOSIS — M47812 Spondylosis without myelopathy or radiculopathy, cervical region: Secondary | ICD-10-CM | POA: Diagnosis not present

## 2021-12-14 DIAGNOSIS — M810 Age-related osteoporosis without current pathological fracture: Secondary | ICD-10-CM | POA: Diagnosis not present

## 2021-12-14 DIAGNOSIS — M47814 Spondylosis without myelopathy or radiculopathy, thoracic region: Secondary | ICD-10-CM | POA: Diagnosis not present

## 2021-12-14 DIAGNOSIS — M7062 Trochanteric bursitis, left hip: Secondary | ICD-10-CM | POA: Diagnosis not present

## 2021-12-21 DIAGNOSIS — N184 Chronic kidney disease, stage 4 (severe): Secondary | ICD-10-CM | POA: Diagnosis not present

## 2021-12-25 ENCOUNTER — Other Ambulatory Visit: Payer: Self-pay | Admitting: Family Medicine

## 2021-12-25 DIAGNOSIS — E785 Hyperlipidemia, unspecified: Secondary | ICD-10-CM

## 2022-01-04 DIAGNOSIS — D0471 Carcinoma in situ of skin of right lower limb, including hip: Secondary | ICD-10-CM | POA: Diagnosis not present

## 2022-01-04 DIAGNOSIS — D0462 Carcinoma in situ of skin of left upper limb, including shoulder: Secondary | ICD-10-CM | POA: Diagnosis not present

## 2022-01-04 DIAGNOSIS — C44722 Squamous cell carcinoma of skin of right lower limb, including hip: Secondary | ICD-10-CM | POA: Diagnosis not present

## 2022-01-04 DIAGNOSIS — D485 Neoplasm of uncertain behavior of skin: Secondary | ICD-10-CM | POA: Diagnosis not present

## 2022-01-04 DIAGNOSIS — Z85828 Personal history of other malignant neoplasm of skin: Secondary | ICD-10-CM | POA: Diagnosis not present

## 2022-01-04 DIAGNOSIS — X32XXXA Exposure to sunlight, initial encounter: Secondary | ICD-10-CM | POA: Diagnosis not present

## 2022-01-04 DIAGNOSIS — L570 Actinic keratosis: Secondary | ICD-10-CM | POA: Diagnosis not present

## 2022-01-04 DIAGNOSIS — D2261 Melanocytic nevi of right upper limb, including shoulder: Secondary | ICD-10-CM | POA: Diagnosis not present

## 2022-01-04 DIAGNOSIS — D225 Melanocytic nevi of trunk: Secondary | ICD-10-CM | POA: Diagnosis not present

## 2022-01-04 DIAGNOSIS — D2262 Melanocytic nevi of left upper limb, including shoulder: Secondary | ICD-10-CM | POA: Diagnosis not present

## 2022-01-05 ENCOUNTER — Ambulatory Visit: Payer: Medicare Other | Admitting: Family Medicine

## 2022-01-11 ENCOUNTER — Other Ambulatory Visit: Payer: Self-pay | Admitting: Family Medicine

## 2022-01-13 ENCOUNTER — Telehealth: Payer: Self-pay | Admitting: *Deleted

## 2022-01-13 NOTE — Patient Outreach (Signed)
  Care Coordination   01/13/2022 Name: Kelly [name] MRN: [id number] DOB: [birth year]   Care Coordination Outreach Attempts:  An unsuccessful telephone outreach was attempted today to offer the patient information about available care coordination services as a benefit of their health plan.   Follow Up Plan:  Additional outreach attempts will be made to offer the patient care coordination information and services.   Encounter Outcome:  No Answer  Care Coordination Interventions Activated:  No   Care Coordination Interventions:  No, not indicated    Emelia Loron RN, BSN [place] [phone] [email or web address]

## 2022-02-01 DIAGNOSIS — E1122 Type 2 diabetes mellitus with diabetic chronic kidney disease: Secondary | ICD-10-CM | POA: Diagnosis not present

## 2022-02-01 DIAGNOSIS — D0462 Carcinoma in situ of skin of left upper limb, including shoulder: Secondary | ICD-10-CM | POA: Diagnosis not present

## 2022-02-01 DIAGNOSIS — I10 Essential (primary) hypertension: Secondary | ICD-10-CM | POA: Diagnosis not present

## 2022-02-01 DIAGNOSIS — N1832 Chronic kidney disease, stage 3b: Secondary | ICD-10-CM | POA: Diagnosis not present

## 2022-02-01 DIAGNOSIS — N2581 Secondary hyperparathyroidism of renal origin: Secondary | ICD-10-CM | POA: Diagnosis not present

## 2022-02-01 DIAGNOSIS — N184 Chronic kidney disease, stage 4 (severe): Secondary | ICD-10-CM | POA: Diagnosis not present

## 2022-02-03 DIAGNOSIS — N2581 Secondary hyperparathyroidism of renal origin: Secondary | ICD-10-CM | POA: Diagnosis not present

## 2022-02-03 DIAGNOSIS — R600 Localized edema: Secondary | ICD-10-CM | POA: Diagnosis not present

## 2022-02-03 DIAGNOSIS — N1832 Chronic kidney disease, stage 3b: Secondary | ICD-10-CM | POA: Diagnosis not present

## 2022-02-03 DIAGNOSIS — I10 Essential (primary) hypertension: Secondary | ICD-10-CM | POA: Diagnosis not present

## 2022-02-03 DIAGNOSIS — E1122 Type 2 diabetes mellitus with diabetic chronic kidney disease: Secondary | ICD-10-CM | POA: Diagnosis not present

## 2022-02-22 ENCOUNTER — Other Ambulatory Visit: Payer: Self-pay | Admitting: Family Medicine

## 2022-02-22 DIAGNOSIS — C44722 Squamous cell carcinoma of skin of right lower limb, including hip: Secondary | ICD-10-CM | POA: Diagnosis not present

## 2022-02-22 DIAGNOSIS — D2371 Other benign neoplasm of skin of right lower limb, including hip: Secondary | ICD-10-CM | POA: Diagnosis not present

## 2022-02-22 DIAGNOSIS — E1122 Type 2 diabetes mellitus with diabetic chronic kidney disease: Secondary | ICD-10-CM

## 2022-02-23 ENCOUNTER — Other Ambulatory Visit: Payer: Self-pay | Admitting: Family Medicine

## 2022-02-28 ENCOUNTER — Ambulatory Visit
Admission: EM | Admit: 2022-02-28 | Discharge: 2022-02-28 | Disposition: A | Discharge status: Home / Self Care | Payer: Medicare Other

## 2022-02-28 DIAGNOSIS — L03115 Cellulitis of right lower limb: Secondary | ICD-10-CM

## 2022-02-28 MED ORDER — CEPHALEXIN 500 MG PO CAPS: 500.0000 mg | ORAL_CAPSULE | Freq: Two times a day (BID) | ORAL | 0 refills | Status: AC

## 2022-02-28 NOTE — Discharge Instructions (Addendum)
You are being treated for cellulitis of your leg.  Please try to keep your leg elevated above the level of your heart when you are not standing or walking.  Inspect the wound and change the dressing daily.  Report any worsening symptoms to your dermatologist or primary care doctor.  I have prescribed an antibiotic.  Your dermatologist may change your plan of care based on his evaluation of the wound at your appointment tomorrow.

## 2022-02-28 NOTE — ED Provider Notes (Signed)
[name]    CSN: [id number] Arrival date & time: 02/28/22  1506      History   Chief Complaint Chief Complaint  Patient presents with   Nasal Congestion   Cough   Leg Injury    HPI Kelly [name] is a 81 y.o. female.    Cough   Presents to UC with complaint of nasal congestion and cough x1 week.  Treating self with OTC medication.  Also concerned for possible infection on her right shin.  Patient states she had skin cancer removal performed last week and is concerned about the healing process.  PMH significant for:  - CKD stage IV, - DM 2, last A1c 09/29/2021 equals 8.5 - Bilateral lower extremity edema - Left ventricular hypertrophy  Past Medical History:  Diagnosis Date   Colon polyps    Depression    DM (diabetes mellitus), type 2 with renal complications ([place])    Hyperlipidemia    Hypertension    Kidney disease    Kidney stones    Shingles 12/05/2019   Sleep apnea    Squamous cell skin cancer 12/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     Patient Active Problem List   Diagnosis Date Noted   Leg swelling 12/01/2021   Arthralgia 11/08/2021   Skin tear of lower leg without complication, initial encounter 10/01/2021   Constipation 07/02/2021   Elevated lipase 07/02/2021   Thrombocytopenia ([place]) 07/02/2021   Hair loss 07/02/2021   Soft tissue lesion 07/02/2021   Anxiety 06/11/2021   Trochanteric bursitis, right hip 12/08/2020   Foraminal stenosis of lumbosacral region 12/08/2020   Lumbar herniated disc 12/08/2020   Lumbar radiculopathy 12/02/2020   Rash 09/29/2020   Onychomycosis 08/11/2020   Sleeping difficulty 08/11/2020   Allergic rhinitis 08/11/2020   CPAP use counseling 06/01/2020   Dysuria 03/02/2020   Edema of lower extremity 11/19/2019   Hyperlipidemia 11/15/2019   Low back pain 11/15/2019   Dry eyes 10/01/2019   Tick bite 10/01/2019   Personal history of other specified conditions 07/03/2019   Fall 04/15/2019   Type  2 diabetes mellitus ([place]) 03/18/2019   Hypertension 03/18/2019   Hyperparathyroidism due to renal insufficiency ([place]) 03/18/2019   Headache disorder 09/10/2018   Cervico-occipital neuralgia 07/23/2018   Osteoporosis 07/23/2018   Cramp and spasm 02/12/2018   Paresthesia of hand 10/25/2017   Lesion of rectum 09/07/2017   Loss of height 08/15/2017   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   Cervical spondylosis without myelopathy 08/15/2017   DDD (degenerative disc disease), cervical 08/15/2017   Bursitis of hip 08/15/2017   Female stress incontinence 08/15/2017   Muscle weakness 08/15/2017   Neck pain 08/15/2017   Spondylolisthesis, congenital 08/15/2017   Radial styloid tenosynovitis 08/15/2017   Senile osteoporosis 08/15/2017   Hip pain 08/15/2017   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   Rectal hemorrhage 07/25/2017   Essential tremor 04/26/2017   Other fatigue 03/22/2017   Left ventricular hypertrophy 10/12/2016   Chronic kidney disease, stage 4 (severe) ([place]) 08/26/2016   OSA on CPAP 08/26/2016    Past Surgical History:  Procedure Laterality Date   APPENDECTOMY     BREAST BIOPSY Right 06/19/2018   affirm stereo/x clip/COLUMNAR CELL CHANGE WITH MICROCALCIFICATIONS. FIBROCYSTIC CHANGES WITH MICROCALCIFICATIONS   cataract  03/2017   CHOLECYSTECTOMY     COLONOSCOPY     COLONOSCOPY WITH PROPOFOL N/A 08/22/2017   Procedure: COLONOSCOPY WITH PROPOFOL;  Surgeon: Jonathon Bellows, MD;  Location: [facility] ENDOSCOPY;  Service: Gastroenterology;  Laterality: N/A;   ESOPHAGOGASTRODUODENOSCOPY     ESOPHAGOGASTRODUODENOSCOPY (EGD) WITH PROPOFOL N/A 03/23/2016   Procedure: ESOPHAGOGASTRODUODENOSCOPY (EGD) WITH PROPOFOL;  Surgeon: Manya Silvas, MD;  Location[facility] ENDOSCOPY;  Service: Endoscopy;  Laterality: N/A;   FLEXIBLE SIGMOIDOSCOPY N/A 02/06/2018   Procedure: FLEXIBLE SIGMOIDOSCOPY;  Surgeon: Jonathon Bellows, MD;  Location: [facility] ENDOSCOPY;  Service: Gastroenterology;  Laterality: N/A;    FLEXIBLE SIGMOIDOSCOPY N/A 12/03/2018   Procedure: FLEXIBLE SIGMOIDOSCOPY;  Surgeon: Jonathon Bellows, MD;  Location[facility] ENDOSCOPY;  Service: Gastroenterology;  Laterality: N/A;   RECTAL PROLAPSE REPAIR  2012   x2    OB History   No obstetric history on file.      Home Medications    Prior to Admission medications   Medication Sig Start Date End Date Taking? Authorizing Provider  amLODipine (NORVASC) 5 MG tablet TAKE 1 TABLET DAILY 02/05/21   [name], MD  aspirin EC 81 MG tablet Take 81 mg by mouth daily.    [provider]  atorvastatin (LIPITOR) 80 MG tablet TAKE 1 TABLET DAILY 12/26/21   Dutch Quint B, FNP  BD PEN NEEDLE NANO 2ND GEN 32G X 4 MM MISC USE DAILY WITH VICTOZA AND BASAGLAR 01/12/22   [name], MD  calcitRIOL (ROCALTROL) 0.25 MCG capsule Take 0.25 mcg by mouth daily.    [provider]  Cholecalciferol (VITAMIN D) 50 MCG (2000 UT) CAPS Take 2,000 Units by mouth daily.     [provider]  Continuous Blood Gluc Sensor (FREESTYLE LIBRE 2 SENSOR) MISC USE TO CHECK SUGAR AT LEAST4 TIMES A DAY 09/27/21   [name], MD  cyclobenzaprine (FLEXERIL) 10 MG tablet TAKE 1/2 TABLET ('5MG'$  TOTAL)3 TIMES A DAY AS NEEDED    FOR MUSCLE SPASMS 01/28/20   [name], MD  denosumab (PROLIA) 60 MG/ML SOSY injection Inject 60 mg into the skin every 6 (six) months.    [provider]  furosemide (LASIX) 40 MG tablet Take 40 mg by mouth daily as needed.    [provider]  gabapentin (NEURONTIN) 100 MG capsule Take 1 capsule (100 mg total) by mouth 3 (three) times daily. 12/02/20   [name], MD  glucosamine-chondroitin 500-400 MG tablet Take 1 tablet by mouth 3 (three) times daily.    [provider]  glucose blood (ACCU-CHEK AVIVA PLUS) test strip Use to check blood sugar up to 4 times daily 07/30/18   [name], MD  HYDROcodone-acetaminophen (NORCO/VICODIN) 5-325 MG tablet Take 1 tablet by mouth  every 4 (four) hours as needed.    [provider]  Insulin Glargine (BASAGLAR KWIKPEN) 100 UNIT/ML INJECT 20 UNITS TOTAL      SUBCUTANEOUSLY DAILY. 02/22/22   [name], MD  Lancets MISC Use up to 4 times daily to check blood sugars. 11/04/16   Coral Spikes, DO  liraglutide (VICTOZA) 18 MG/3ML SOPN Inject 1.8 mg into the skin daily. 10/01/21   [name], MD  losartan (COZAAR) 50 MG tablet TAKE 1 TABLET DAILY 02/24/22   [name], MD  Melatonin 5 MG CHEW Chew 5 mg by mouth daily.    [provider]  mupirocin ointment (BACTROBAN) 2 % Apply 1 application. topically 2 (two) times daily. Right leg wound 10/05/21   McLean-Scocuzza, Nino Glow, MD  potassium chloride (K-DUR) 10 MEQ tablet Take 10 mEq by mouth daily as needed.    [provider]  sertraline (ZOLOFT) 50 MG tablet Take 1 tablet (  50 mg total) by mouth daily. 09/03/21   [name], MD  triamcinolone ointment (KENALOG) 0.1 % Apply 1 Application topically 2 (two) times daily as needed (rash). 12/01/21   [name], MD    Family History Family History  Problem Relation Age of Onset   Stroke Mother    Arthritis Mother    Aortic aneurysm Sister    Lung cancer Sister    Heart attack Father 76   Breast cancer Sister 60    Social History Social History   Tobacco Use   Smoking status: Never   Smokeless tobacco: Never  Vaping Use   Vaping Use: Never used  Substance Use Topics   Alcohol use: No   Drug use: No     Allergies   Prilosec otc [omeprazole magnesium], Sucralfate, Amoxicillin, and Morphine and related   Review of Systems Review of Systems   Physical Exam Triage Vital Signs ED Triage Vitals  Enc Vitals Group     BP      Pulse      Resp      Temp      Temp src      SpO2      Weight      Height      Head Circumference      Peak Flow      Pain Score      Pain Loc      Pain Edu?      Excl. in [place]?    No data found.  Updated Vital  Signs There were no vitals taken for this visit.  Visual Acuity Right Eye Distance:   Left Eye Distance:   Bilateral Distance:    Right Eye Near:   Left Eye Near:    Bilateral Near:     Physical Exam Skin:    General: Skin is warm and dry.       Neurological:     General: No focal deficit present.     Mental Status: She is oriented to person, place, and time.  Psychiatric:        Mood and Affect: Mood normal.        Behavior: Behavior normal.      UC Treatments / Results  Labs (all labs ordered are listed, but only abnormal results are displayed) Labs Reviewed - No data to display  EKG   Radiology No results found.  Procedures Procedures (including critical care time)  Medications Ordered in UC Medications - No data to display  Initial Impression / Assessment and Plan / UC Course  I have reviewed the triage vital signs and the nursing notes.  Pertinent labs & imaging results that were available during my care of the patient were reviewed by me and considered in my medical decision making (see chart for details).   Cellulitis of right lower extremity secondary to surgical wound.  Tissue is tight and tender to palpation.  Incision is surrounded by 2 cm's of erythema and cellulitis.  Will treat with cephalexin, which she has previously tolerated, renally dosed.  Recent GFR equals 36.  Patient will follow-up with her dermatologist tomorrow and is asked to follow his instructions guarding any adjustments in plan of care.  Also asked patient to schedule visit with wound care.   Final Clinical Impressions(s) / UC Diagnoses   Final diagnoses:  None   Discharge Instructions   None    ED Prescriptions   None    PDMP not reviewed this  encounter.   Rose Phi, [place] 02/28/22 [phone]

## 2022-02-28 NOTE — ED Triage Notes (Signed)
Pt. Presents to UC w/ c/o nasal congestion and a cough for 1 week. Pt. Has been treating herself w/ OTC medication. Pt. Also endorses concern for infection on her right shin. Pt. States she had skin cancer removal performed last week in the area and is concerned about the healing process.

## 2022-03-01 DIAGNOSIS — Z4801 Encounter for change or removal of surgical wound dressing: Secondary | ICD-10-CM | POA: Diagnosis not present

## 2022-03-04 ENCOUNTER — Telehealth: Payer: Self-pay | Admitting: Family Medicine

## 2022-03-04 DIAGNOSIS — T8140XA Infection following a procedure, unspecified, initial encounter: Secondary | ICD-10-CM | POA: Diagnosis not present

## 2022-03-04 NOTE — Telephone Encounter (Signed)
Pt daughter would like to be called concerning the pt right leg being pussy, puffy and having red streaks from a dermatologist procedure.

## 2022-03-04 NOTE — Telephone Encounter (Signed)
Patient's daughter, Siddalee Vanderheiden, states she spoke with Fulton Mole, CMA, earlier today and Gae Bon asked her to let her know whether or not patient was able to be seen by someone today.  Marcie Bal states patient has been seen today by dermatologist, Dr. Karle Barr, and her antibiotic has been switched again.  Marcie Bal states patient's antibiotic was called in to [place] and they haven't picked it up yet.  Marcie Bal states patient has a follow-up with her dermatologist, Dr. Kirkland Hun, on 03/08/2022.

## 2022-03-04 NOTE — Telephone Encounter (Signed)
Noted  

## 2022-03-06 ENCOUNTER — Telehealth: Payer: Self-pay | Admitting: *Deleted

## 2022-03-06 NOTE — Telephone Encounter (Signed)
Patient is scheduled for Prolia Injection on 04/08/22 (will received injection during OV)  $0 due, no PA required

## 2022-03-09 ENCOUNTER — Encounter: Payer: Medicare Other | Attending: Internal Medicine | Admitting: Internal Medicine

## 2022-03-09 DIAGNOSIS — I120 Hypertensive chronic kidney disease with stage 5 chronic kidney disease or end stage renal disease: Secondary | ICD-10-CM | POA: Insufficient documentation

## 2022-03-09 DIAGNOSIS — T8131XD Disruption of external operation (surgical) wound, not elsewhere classified, subsequent encounter: Secondary | ICD-10-CM | POA: Insufficient documentation

## 2022-03-09 DIAGNOSIS — Z85828 Personal history of other malignant neoplasm of skin: Secondary | ICD-10-CM | POA: Insufficient documentation

## 2022-03-09 DIAGNOSIS — I87311 Chronic venous hypertension (idiopathic) with ulcer of right lower extremity: Secondary | ICD-10-CM | POA: Insufficient documentation

## 2022-03-09 DIAGNOSIS — I872 Venous insufficiency (chronic) (peripheral): Secondary | ICD-10-CM | POA: Insufficient documentation

## 2022-03-09 DIAGNOSIS — E1122 Type 2 diabetes mellitus with diabetic chronic kidney disease: Secondary | ICD-10-CM | POA: Diagnosis not present

## 2022-03-09 DIAGNOSIS — X58XXXD Exposure to other specified factors, subsequent encounter: Secondary | ICD-10-CM | POA: Diagnosis not present

## 2022-03-09 DIAGNOSIS — E11622 Type 2 diabetes mellitus with other skin ulcer: Secondary | ICD-10-CM | POA: Diagnosis not present

## 2022-03-09 DIAGNOSIS — L97818 Non-pressure chronic ulcer of other part of right lower leg with other specified severity: Secondary | ICD-10-CM | POA: Diagnosis not present

## 2022-03-09 DIAGNOSIS — N186 End stage renal disease: Secondary | ICD-10-CM | POA: Insufficient documentation

## 2022-03-09 DIAGNOSIS — T8131XA Disruption of external operation (surgical) wound, not elsewhere classified, initial encounter: Secondary | ICD-10-CM | POA: Diagnosis not present

## 2022-03-10 NOTE — Progress Notes (Signed)
ANNIEBELLE, DEVORE ([id number]) [id number] Page 1 of 7 Visit Report for 03/09/2022 Chief Complaint Document Details Patient Name: Date of Service: [name] RD, [name] 03/09/2022 8:45 A M Medical Record Number: [id number] Patient Account Number: [id number] Date of Birth/Sex: Treating RN: 01-17-1941 (81 y.o. [name] Primary Care Provider: Tommi Rumps Other Clinician: Referring Provider: Treating Provider/Extender: RO BSO Delane Ginger, MICHA EL G Self, Referral Weeks in Treatment: 0 Information Obtained from: Patient Chief Complaint Right LE Ulcer 03/09/2022; patient is here for review of a wound on the right anterior mid tibia which is a surgical wound Electronic Signature(s) Signed: 03/09/2022 3:59:15 PM By: Linton Ham MD Entered By: Linton Ham on 03/09/2022 09:02:35 -------------------------------------------------------------------------------- HPI Details Patient Name: Date of Service: SHEPPA RD, [place] RGA [name] 03/09/2022 8:45 A M Medical Record Number: [id number] Patient Account Number: [id number] Date of Birth/Sex: Treating RN: February 21, 1941 (81 y.o. [name] Primary Care Provider: Tommi Rumps Other Clinician: Referring Provider: Treating Provider/Extender: Eldridge Dace, MICHA EL G Self, Referral Weeks in Treatment: 0 History of Present Illness HPI Description: 10-28-2021 upon evaluation today patient appears to be doing somewhat poorly in regard to a wound on her right leg. This happened when she had a concrete birdbath fall and hit her leg on May 16. She went to urgent care 2 days later since that time she has been on 2 rounds of doxycycline as well as a round of clindamycin the good news is I see no evidence of infection at this point. Fortunately there is no signs of active infection locally or systemically which is great news and overall I think you are on the right track as far as healing is concerned I do believe that there  are some things we can do to help her out here. The patient does have a history of diabetes mellitus type 2, hypertension, chronic kidney disease stage IV, and chronic venous insufficiency/hypertension. Her most recent hemoglobin A1c was 8.5 on 09-29-2021. 11-04-2021 upon evaluation today patient appears to be doing well currently in regard to her wound. She has been tolerating the dressing changes without complication. Fortunately I do not see any evidence of infection locally or systemically which is great news. 11-18-2021 upon evaluation today patient appears to be doing well currently in regard to her wound. She has been tolerating the dressing changes without complication. Fortunately there does not appear to be any evidence of active infection locally or systemically at this time which is great news. No fevers, chills, nausea, vomiting, or diarrhea. PAISLI, SILFIES ([id number]) [id number] Page 2 of 7 READMISSION 03/09/2022 This is a 81 year old woman we actually had in the clinic for visits in June and July of this year. At this point she had a traumatic wound on the right anterior lower leg which ultimately healed. I did not see that she was actually discharged in a healed state from here that being said it did close over. Roughly 2 weeks ago she had a skin cancer removed by her dermatologist Dr. Evorn Gong from an area on the same leg just above the wound area that we were dealing with. It is sutured. She was seen in the urgent care a week ago because of pain and erythema and put on cephalexin for cellulitis although the patient tells me currently she is on doxycycline. She is using Vaseline gauze changing daily. She comes in today using the Vaseline gauze the area around the wound is somewhat macerated slight drainage from the center of this.  She does not have a vascular issue her ABIs in the right leg were normal when she was here during the summer Patient is a  type II diabetic, hypertension, chronic kidney disease stage IIIb Electronic Signature(s) Signed: 03/09/2022 3:59:15 PM By: Linton Ham MD Entered By: Linton Ham on 03/09/2022 09:04:40 -------------------------------------------------------------------------------- Physical Exam Details Patient Name: Date of Service: SHEPPA RD, [place] RGA [name] 03/09/2022 8:45 A M Medical Record Number: [id number] Patient Account Number: [id number] Date of Birth/Sex: Treating RN: Mar 16, 1941 (81 y.o. [name] Primary Care Provider: Tommi Rumps Other Clinician: Referring Provider: Treating Provider/Extender: RO BSO N, MICHA EL G Self, Referral Weeks in Treatment: 0 Constitutional Patient is hypertensive.. Pulse regular and within target range for patient.Marland Kitchen Respirations regular, non-labored and within target range.. Temperature is normal and within the target range for the patient.Marland Kitchen appears in no distress. Respiratory Respiratory effort is easy and symmetric bilaterally. Rate is normal at rest and on room air.. Cardiovascular Pedal pulses are palpable at the dorsalis pedis and posterior tibia arteries. Notes Wound exam; mid tibial area. Still sutures are in place. Most of this is adherent. She does have a small probing area in the superior part of this wound mid aspect not completely adherent but not bad. No clear evidence of current infection around the wound she has lost an oval-shaped area of epithelium suggesting that possibly this was infected at some point but she is a lot better now. Electronic Signature(s) Signed: 03/09/2022 3:59:15 PM By: Linton Ham MD Entered By: Linton Ham on 03/09/2022 09:06:06 -------------------------------------------------------------------------------- Physician Orders Details Patient Name: Date of Service: [facility] RD, [place] RGA [name] 03/09/2022 8:45 A Kennieth Rad ([id number]) [id number] Page 3 of  7 Medical Record Number: [id number] Patient Account Number: [id number] Date of Birth/Sex: Treating RN: 11/03/1940 (81 y.o. [name] Primary Care Provider: Tommi Rumps Other Clinician: Referring Provider: Treating Provider/Extender: RO BSO N, MICHA EL G Self, Referral Weeks in Treatment: 0 Verbal / Phone Orders: No Diagnosis Coding Follow-up Appointments Return Appointment in 1 week. Bathing/ Shower/ Hygiene May shower; gently cleanse wound with antibacterial soap, rinse and pat dry prior to dressing wounds Edema Control - Lymphedema / Segmental Compressive Device / Other Elevate, Exercise Daily and A void Standing for Long Periods of Time. Elevate legs to the level of the heart and pump ankles as often as possible Elevate leg(s) parallel to the floor when sitting. Wound Treatment Wound #2 - Lower Leg Wound Laterality: Right, Anterior Prim Dressing: Silvercel 4 1/4x 4 1/4 (in/in) 1 x Per Day/30 Days ary Discharge Instructions: Apply Silvercel 4 1/4x 4 1/4 (in/in) as instructed Secondary Dressing: (BORDER) Zetuvit Plus SILICONE BORDER Dressing 4x4 (in/in) 1 x Per Day/30 Days Discharge Instructions: Please do not put silicone bordered dressings under wraps. Use non-bordered dressing only. Electronic Signature(s) Signed: 03/09/2022 3:59:15 PM By: Linton Ham MD Signed: 03/10/2022 8:11:11 AM By: Carlene Coria RN Entered By: Carlene Coria on 03/09/2022 08:59:25 -------------------------------------------------------------------------------- Problem List Details Patient Name: Date of Service: SHEPPA RD, [place] R[place] [name] 03/09/2022 8:45 A M Medical Record Number: [id number] Patient Account Number: [id number] Date of Birth/Sex: Treating RN: 13-Oct-1940 (81 y.o. [name] Primary Care Provider: Tommi Rumps Other Clinician: Referring Provider: Treating Provider/Extender: RO BSO N, MICHA EL G Self, Referral Weeks in Treatment: 0 Active Problems ICD-10 Encounter Code  Description Active Date MDM Diagnosis T81.31XD Disruption of external operation (surgical) wound, not elsewhere classified, 03/09/2022 No Yes subsequent encounter L97.818 Non-pressure chronic ulcer of other part of  right lower leg with other specified 03/09/2022 No Yes severity Inactive Problems MAHSA, HANSER ([id number]) [id number] Page 4 of 7 Resolved Problems Electronic Signature(s) Signed: 03/09/2022 3:59:15 PM By: Linton Ham MD Entered By: Linton Ham on 03/09/2022 09:02:03 -------------------------------------------------------------------------------- Progress Note Details Patient Name: Date of Service: SHEPPA RD, [place] R[place] [name] 03/09/2022 8:45 A M Medical Record Number: [id number] Patient Account Number: [id number] Date of Birth/Sex: Treating RN: 22-Nov-1940 (81 y.o. [name] Primary Care Provider: Tommi Rumps Other Clinician: Referring Provider: Treating Provider/Extender: Eldridge Dace, MICHA EL G Self, Referral Weeks in Treatment: 0 Subjective Chief Complaint Information obtained from Patient Right LE Ulcer 03/09/2022; patient is here for review of a wound on the right anterior mid tibia which is a surgical wound History of Present Illness (HPI) 10-28-2021 upon evaluation today patient appears to be doing somewhat poorly in regard to a wound on her right leg. This happened when she had a concrete birdbath fall and hit her leg on May 16. She went to urgent care 2 days later since that time she has been on 2 rounds of doxycycline as well as a round of clindamycin the good news is I see no evidence of infection at this point. Fortunately there is no signs of active infection locally or systemically which is great news and overall I think you are on the right track as far as healing is concerned I do believe that there are some things we can do to help her out here. The patient does have a history of diabetes mellitus type 2,  hypertension, chronic kidney disease stage IV, and chronic venous insufficiency/hypertension. Her most recent hemoglobin A1c was 8.5 on 09-29-2021. 11-04-2021 upon evaluation today patient appears to be doing well currently in regard to her wound. She has been tolerating the dressing changes without complication. Fortunately I do not see any evidence of infection locally or systemically which is great news. 11-18-2021 upon evaluation today patient appears to be doing well currently in regard to her wound. She has been tolerating the dressing changes without complication. Fortunately there does not appear to be any evidence of active infection locally or systemically at this time which is great news. No fevers, chills, nausea, vomiting, or diarrhea. READMISSION 03/09/2022 This is a 81 year old woman we actually had in the clinic for visits in June and July of this year. At this point she had a traumatic wound on the right anterior lower leg which ultimately healed. I did not see that she was actually discharged in a healed state from here that being said it did close over. Roughly 2 weeks ago she had a skin cancer removed by her dermatologist Dr. Evorn Gong from an area on the same leg just above the wound area that we were dealing with. It is sutured. She was seen in the urgent care a week ago because of pain and erythema and put on cephalexin for cellulitis although the patient tells me currently she is on doxycycline. She is using Vaseline gauze changing daily. She comes in today using the Vaseline gauze the area around the wound is somewhat macerated slight drainage from the center of this. She does not have a vascular issue her ABIs in the right leg were normal when she was here during the summer Patient is a type II diabetic, hypertension, chronic kidney disease stage IIIb Patient History Information obtained from Patient. Allergies amoxicillin, morphine, sucralfate, Prilosec Social History Never  smoker, Marital Status - Widowed, Alcohol Use - Never, Drug  Use - No History, Caffeine Use - Daily. Medical History Respiratory Patient has history of Sleep Apnea - c-pap Cardiovascular Patient has history of Hypertension - takes medication Endocrine Patient has history of Type II Diabetes ALAIYAH, BOLLMAN ([id number]) [id number] Page 5 of 7 Genitourinary Patient has history of End Stage Renal Disease - Stage 4 kidney diseasse Musculoskeletal Patient has history of Osteoarthritis - back, neck and joints Neurologic Patient has history of Neuropathy - feet Medical A Surgical History Notes nd Oncologic Skin cancer-face, arms, hands Objective Constitutional Patient is hypertensive.. Pulse regular and within target range for patient.Marland Kitchen Respirations regular, non-labored and within target range.. Temperature is normal and within the target range for the patient.Marland Kitchen appears in no distress. Vitals Time Taken: 8:33 AM, Height: 63 in, Source: Stated, Weight: 120 lbs, Source: Stated, BMI: 21.3, Temperature: 97.9 F, Pulse: 84 bpm, Respiratory Rate: 18 breaths/min, Blood Pressure: 163/75 mmHg. Respiratory Respiratory effort is easy and symmetric bilaterally. Rate is normal at rest and on room air.. Cardiovascular Pedal pulses are palpable at the dorsalis pedis and posterior tibia arteries. General Notes: Wound exam; mid tibial area. Still sutures are in place. Most of this is adherent. She does have a small probing area in the superior part of this wound mid aspect not completely adherent but not bad. No clear evidence of current infection around the wound she has lost an oval-shaped area of epithelium suggesting that possibly this was infected at some point but she is a lot better now. Integumentary (Hair, Skin) Wound #2 status is Open. Original cause of wound was Surgical Injury. The date acquired was: 02/23/2022. The wound is located on the Right,Anterior  Lower Leg. The wound measures 4cm length x 0.3cm width x 0.2cm depth; 0.942cm^2 area and 0.188cm^3 volume. There is Fat Layer (Subcutaneous Tissue) exposed. There is no tunneling or undermining noted. There is a medium amount of serosanguineous drainage noted. There is medium (34-66%) pink granulation within the wound bed. There is a medium (34-66%) amount of necrotic tissue within the wound bed including Adherent Slough. Assessment Active Problems ICD-10 Disruption of external operation (surgical) wound, not elsewhere classified, subsequent encounter Non-pressure chronic ulcer of other part of right lower leg with other specified severity Plan Follow-up Appointments: Return Appointment in 1 week. Bathing/ Shower/ Hygiene: May shower; gently cleanse wound with antibacterial soap, rinse and pat dry prior to dressing wounds Edema Control - Lymphedema / Segmental Compressive Device / Other: Elevate, Exercise Daily and Avoid Standing for Long Periods of Time. Elevate legs to the level of the heart and pump ankles as often as possible Elevate leg(s) parallel to the floor when sitting. WOUND #2: - Lower Leg Wound Laterality: Right, Anterior Prim Dressing: Silvercel 4 1/4x 4 1/4 (in/in) 1 x Per Day/30 Days ary Discharge Instructions: Apply Silvercel 4 1/4x 4 1/4 (in/in) as instructed Secondary Dressing: (BORDER) Zetuvit Plus SILICONE BORDER Dressing 4x4 (in/in) 1 x Per Day/30 Days Discharge Instructions: Please do not put silicone bordered dressings under wraps. Use non-bordered dressing only. 1. Postsurgical wound for skin cancer. I do not know exactly what type of cancer this was. 2. Her wound is sutured may be slightly dehisced in the middle and there is a probing area superiorly I did not remove the sutures she has an appointment next week with Dr. Evorn Gong [dermatology] to consider suture removal 3. We use silver alginate on the wound under border foam which she can change daily. 4. She is on  doxycycline but I see no evidence of  active infection at this point I did not change this order. Certainly no cultures were necessary. ANICA, ALCARAZ ([id number]) [id number] Page 6 of 7 5. The original traumatic wound that we dealt with in the summer was just below this this is healed 6. I gave her an appointment for next week but in truth this may not be necessary after she sees her dermatologist on Tuesday Electronic Signature(s) Signed: 03/09/2022 3:59:15 PM By: Linton Ham MD Entered By: Linton Ham on 03/09/2022 09:07:41 -------------------------------------------------------------------------------- ROS/PFSH Details Patient Name: Date of Service: SHEPPA RD, [place] RGA [name] 03/09/2022 8:45 A M Medical Record Number: [id number] Patient Account Number: [id number] Date of Birth/Sex: Treating RN: [birth year] (81 y.o. [name] Primary Care Provider: Tommi Rumps Other Clinician: Referring Provider: Treating Provider/Extender: RO BSO N, MICHA EL G Self, Referral Weeks in Treatment: 0 Information Obtained From Patient Respiratory Medical History: Positive for: Sleep Apnea - c-pap Cardiovascular Medical History: Positive for: Hypertension - takes medication Endocrine Medical History: Positive for: Type II Diabetes Time with diabetes: 15 years Treated with: Insulin Blood sugar tested every day: Yes Tested : 5 Genitourinary Medical History: Positive for: End Stage Renal Disease - Stage 4 kidney diseasse Musculoskeletal Medical History: Positive for: Osteoarthritis - back, neck and joints Neurologic Medical History: Positive for: Neuropathy - feet Oncologic Medical History: Past Medical History Notes: Skin cancer-face, arms, hands Immunizations LUCIELLE, VOKES ([id number]) [id number] Page 7 of 7 Pneumococcal Vaccine: Received Pneumococcal Vaccination: Yes Received Pneumococcal Vaccination  On or After 60th Birthday: Yes Tetanus Vaccine: Last tetanus shot: 05/09/2020 Implantable Devices None Family and Social History Never smoker; Marital Status - Widowed; Alcohol Use: Never; Drug Use: No History; Caffeine Use: Daily Electronic Signature(s) Signed: 03/09/2022 3:59:15 PM By: Linton Ham MD Signed: 03/10/2022 8:11:11 AM By: Carlene Coria RN Entered By: Carlene Coria on 03/09/2022 08:35:52 -------------------------------------------------------------------------------- SuperBill Details Patient Name: Date of Service: SHEPPA RD, [place]. 03/09/2022 Medical Record Number: [id number] Patient Account Number: [id number] Date of Birth/Sex: Treating RN: 05/27/1940 (81 y.o. [name] Primary Care Provider: Tommi Rumps Other Clinician: Referring Provider: Treating Provider/Extender: RO BSO N, [place] EL G Self, Referral Weeks in Treatment: 0 Diagnosis Coding ICD-10 Codes Code Description T81.31XD Disruption of external operation (surgical) wound, not elsewhere classified, subsequent encounter L97.818 Non-pressure chronic ulcer of other part of right lower leg with other specified severity Facility Procedures : CPT4 Code: [id number] Description: 99213 - WOUND CARE VISIT-LEV 3 EST PT Modifier: Quantity: 1 Physician Procedures : CPT4 Code Description Modifier [id number] - WC PHYS LEVEL 4 - EST PT ICD-10 Diagnosis Description T81.31XD Disruption of external operation (surgical) wound, not elsewhere classified, subsequent encounter L97.818 Non-pressure chronic ulcer of other  part of right lower leg with other specified severity Quantity: 1 Electronic Signature(s) Signed: 03/09/2022 3:59:15 PM By: Linton Ham MD Signed: 03/10/2022 8:11:11 AM By: Carlene Coria RN Entered By: Carlene Coria on 03/09/2022 10:17:47

## 2022-03-10 NOTE — Progress Notes (Signed)
ONEDIA, VARGUS ([id number]) [id number] Page 1 of 10 Visit Report for 03/09/2022 Allergy List Details Patient Name: Date of Service: [facility] RD, [name] 03/09/2022 8:45 A M Medical Record Number: [id number] Patient Account Number: [id number] Date of Birth/Sex: Treating RN: [birth year] (81 y.o. [name] Primary Care Paola Aleshire: Tommi Rumps Other Clinician: Referring Lewellyn Fultz: Treating Debanhi Blaker/Extender: RO BSO N, [place] EL G Self, Referral Weeks in Treatment: 0 Allergies Active Allergies amoxicillin morphine sucralfate Prilosec Allergy Notes Electronic Signature(s) Signed: 03/10/2022 8:11:11 AM By: Carlene Coria RN Entered By: Carlene Coria on 03/09/2022 08:34:51 -------------------------------------------------------------------------------- Arrival Information Details Patient Name: Date of Service: [facility] RD, [place] RGA [name] 03/09/2022 8:45 A M Medical Record Number: [id number] Patient Account Number: [id number] Date of Birth/Sex: Treating RN: 1941-02-17 (81 y.o. [name] Primary Care Anushka Hartinger: Tommi Rumps Other Clinician: Referring Aseem Sessums: Treating Makaela Cando/Extender: RO BSO N, MICHA EL G Self, Referral Weeks in Treatment: 0 Visit Information Patient Arrived: Ambulatory Arrival Time: 08:31 Accompanied By: self Transfer Assistance: None Patient Identification Verified: Yes Secondary Verification Process Completed: Yes Patient Requires Transmission-Based Precautions: No Patient Has Alerts: Yes Patient Alerts: ABI left 1.15 10/28/21 ELAIJAH, MUNOZ ([id number]) Patient Alerts: ABI left 1.15 10/28/21 ABI right 1.13 6/22/2 History Since Last Visit All ordered tests and consults were completed: No Added or deleted any medications: No Any new allergies or adverse reactions: No Had a fall or experienced change in activities of daily living that may affect risk of falls: No Signs or symptoms of abuse/neglect since  last visito No Hospitalized since last visit: No Implantable device outside of the clinic excluding cellular tissue based products placed in the center 3Has Dressing in Place as Prescribed: Yes Pain Present Now: No Electronic Signature(s) Signed: 03/10/2022 8:11:11 AM By: Carlene Coria RN Entered By: Carlene Coria on 03/09/2022 08:33:27 -------------------------------------------------------------------------------- Clinic Level of Care Assessment Details Patient Name: Date of Service: [facility] RD, [place] RGA [name] 03/09/2022 8:45 A M Medical Record Number: [id number] Patient Account Number: [id number] Date of Birth/Sex: Treating RN: 1940/06/06 (81 y.o. [name] Primary Care Braeson Rupe: Tommi Rumps Other Clinician: Referring Dericka Ostenson: Treating Roy Snuffer/Extender: RO BSO N, MICHA EL G Self, Referral Weeks in Treatment: 0 Clinic Level of Care Assessment Items TOOL 4 Quantity Score X- 1 0 Use when only an EandM is performed on FOLLOW-UP visit ASSESSMENTS - Nursing Assessment / Reassessment X- 1 10 Reassessment of Co-morbidities (includes updates in patient status) X- 1 5 Reassessment of Adherence to Treatment Plan ASSESSMENTS - Wound and Skin A ssessment / Reassessment X - Simple Wound Assessment / Reassessment - one wound 1 5 '[]'$  - 0 Complex Wound Assessment / Reassessment - multiple wounds '[]'$  - 0 Dermatologic / Skin Assessment (not related to wound area) ASSESSMENTS - Focused Assessment '[]'$  - 0 Circumferential Edema Measurements - multi extremities '[]'$  - 0 Nutritional Assessment / Counseling / Intervention '[]'$  - 0 Lower Extremity Assessment (monofilament, tuning fork, pulses) '[]'$  - 0 Peripheral Arterial Disease Assessment (using hand held doppler) ASSESSMENTS - Ostomy and/or Continence Assessment and Care '[]'$  - 0 Incontinence Assessment and Management '[]'$  - 0 Ostomy Care Assessment and Management (repouching, etc.) PROCESS - Coordination of Care X - Simple Patient / Family  Education for ongoing care 1 15 '[]'$  - 0 Complex (extensive) Patient / Family Education for ongoing care X- 1 10 Staff obtains Programmer, systems, Records, T Results / Process Orders est '[]'$  - 0 Staff telephones HHA, Nursing Homes / Clarify orders / etc '[]'$  - 0 Routine Transfer to  another Facility (non-emergent condition) '[]'$  - 0 Routine Hospital Admission (non-emergent condition) X- 1 15 New Admissions / Insurance Authorizations / Ordering NPWT Apligraf, etc. , ALAINNA, STAWICKI ([id number]) [id number] Page 3 of 10 '[]'$  - 0 Emergency Hospital Admission (emergent condition) X- 1 10 Simple Discharge Coordination '[]'$  - 0 Complex (extensive) Discharge Coordination PROCESS - Special Needs '[]'$  - 0 Pediatric / Minor Patient Management '[]'$  - 0 Isolation Patient Management '[]'$  - 0 Hearing / Language / Visual special needs '[]'$  - 0 Assessment of Community assistance (transportation, D/C planning, etc.) '[]'$  - 0 Additional assistance / Altered mentation '[]'$  - 0 Support Surface(s) Assessment (bed, cushion, seat, etc.) INTERVENTIONS - Wound Cleansing / Measurement X - Simple Wound Cleansing - one wound 1 5 '[]'$  - 0 Complex Wound Cleansing - multiple wounds X- 1 5 Wound Imaging (photographs - any number of wounds) '[]'$  - 0 Wound Tracing (instead of photographs) X- 1 5 Simple Wound Measurement - one wound '[]'$  - 0 Complex Wound Measurement - multiple wounds INTERVENTIONS - Wound Dressings X - Small Wound Dressing one or multiple wounds 1 10 '[]'$  - 0 Medium Wound Dressing one or multiple wounds '[]'$  - 0 Large Wound Dressing one or multiple wounds '[]'$  - 0 Application of Medications - topical '[]'$  - 0 Application of Medications - injection INTERVENTIONS - Miscellaneous '[]'$  - 0 External ear exam '[]'$  - 0 Specimen Collection (cultures, biopsies, blood, body fluids, etc.) '[]'$  - 0 Specimen(s) / Culture(s) sent or taken to Lab for analysis '[]'$  - 0 Patient Transfer (multiple staff /  Civil Service fast streamer / Similar devices) '[]'$  - 0 Simple Staple / Suture removal (25 or less) '[]'$  - 0 Complex Staple / Suture removal (26 or more) '[]'$  - 0 Hypo / Hyperglycemic Management (close monitor of Blood Glucose) '[]'$  - 0 Ankle / Brachial Index (ABI) - do not check if billed separately X- 1 5 Vital Signs Has the patient been seen at the hospital within the last three years: Yes Total Score: 100 Level Of Care: New/Established - Level 3 Electronic Signature(s) Signed: 03/10/2022 8:11:11 AM By: Carlene Coria RN Entered By: Carlene Coria on 03/09/2022 10:17:39 [name] ([id number]) [id number] Page 4 of 10 -------------------------------------------------------------------------------- Encounter Discharge Information Details Patient Name: Date of Service: [facility] RD, [name] 03/09/2022 8:45 A M Medical Record Number: [id number] Patient Account Number: [id number] Date of Birth/Sex: Treating RN: Apr 22, 1941 (81 y.o. [name] Primary Care Caeleb Batalla: Tommi Rumps Other Clinician: Referring Haskel Dewalt: Treating Odilon Cass/Extender: RO BSO N, MICHA EL G Self, Referral Weeks in Treatment: 0 Encounter Discharge Information Items Discharge Condition: Stable Ambulatory Status: Ambulatory Discharge Destination: Home Transportation: Private Auto Accompanied By: self Schedule Follow-up Appointment: Yes Clinical Summary of Care: Electronic Signature(s) Signed: 03/10/2022 8:11:11 AM By: Carlene Coria RN Entered By: Carlene Coria on 03/09/2022 10:18:33 -------------------------------------------------------------------------------- Lower Extremity Assessment Details Patient Name: Date of Service: [facility] RD, [place] R[place] [name] 03/09/2022 8:45 A M Medical Record Number: [id number] Patient Account Number: [id number] Date of Birth/Sex: Treating RN: [birth year] (81 y.o. [name] Primary Care Lisia Westbay: Tommi Rumps Other Clinician: Referring  Mace Weinberg: Treating Connar Keating/Extender: RO BSO N, [place] EL G Self, Referral Weeks in Treatment: 0 Edema Assessment Assessed: [Left: No] [Right: No] Edema: [Left: Ye] [Right: s] Calf Left: Right: Point of Measurement: 32 cm From Medial Instep 29.2 cm Ankle Left: Right: Point of Measurement: 10 cm From Medial Instep 19 cm Knee To Floor Left: Right: From Medial Instep 40 cm Fenstermacher, Miku G ([id number]) [id number] Page  5 of 10 Vascular Assessment Pulses: Dorsalis Pedis Palpable: [Right:Yes] Notes see alerts Electronic Signature(s) Signed: 03/10/2022 8:11:11 AM By: Carlene Coria RN Entered By: Carlene Coria on 03/09/2022 08:46:49 -------------------------------------------------------------------------------- Multi Wound Chart Details Patient Name: Date of Service: [facility] RD, [place] R[place] [name] 03/09/2022 8:45 A M Medical Record Number: [id number] Patient Account Number: [id number] Date of Birth/Sex: Treating RN: 06/06/1940 (81 y.o. [name] Primary Care Talullah Abate: Tommi Rumps Other Clinician: Referring Mayson Sterbenz: Treating Cheryel Kyte/Extender: RO BSO N, MICHA EL G Self, Referral Weeks in Treatment: 0 Vital Signs Height(in): 63 Pulse(bpm): 84 Weight(lbs): 120 Blood Pressure(mmHg): 163/75 Body Mass Index(BMI): 21.3 Temperature(F): 97.9 Respiratory Rate(breaths/min): 18 [2:Photos:] [N/A:N/A] Right, Anterior Lower Leg N/A N/A Wound Location: Surgical Injury N/A N/A Wounding Event: Open Surgical Wound N/A N/A Primary Etiology: Sleep Apnea, Hypertension, Type II N/A N/A Comorbid History: Diabetes, End Stage Renal Disease, Osteoarthritis, Neuropathy 02/23/2022 N/A N/A Date Acquired: 0 N/A N/A Weeks of Treatment: Open N/A N/A Wound Status: No N/A N/A Wound Recurrence: 4x0.3x0.2 N/A N/A Measurements L x W x D (cm) 0.942 N/A N/A A (cm) : rea 0.188 N/A N/A Volume (cm) : Full Thickness Without Exposed N/A  N/A Classification: Support Structures Medium N/A N/A Exudate Amount: Serosanguineous N/A N/A Exudate Type: red, brown N/A N/A Exudate Color: Medium (34-66%) N/A N/A Granulation Amount: Pink N/A N/A Granulation Quality: Medium (34-66%) N/A N/A Necrotic Amount: Fat Layer (Subcutaneous Tissue): Yes N/A N/A Exposed Structures: LIA, VIGILANTE ([id number]) [id number] Page 6 of 10 Fascia: No Tendon: No Muscle: No Joint: No Bone: No None N/A N/A Epithelialization: Treatment Notes Electronic Signature(s) Signed: 03/09/2022 3:59:15 PM By: Linton Ham MD Entered By: Linton Ham on 03/09/2022 09:02:08 -------------------------------------------------------------------------------- Multi-Disciplinary Care Plan Details Patient Name: Date of Service: [facility] RD, [place] RGA [name] 03/09/2022 8:45 A M Medical Record Number: [id number] Patient Account Number: [id number] Date of Birth/Sex: Treating RN: Nov 19, 1940 (81 y.o. [name] Primary Care Tarius Stangelo: Tommi Rumps Other Clinician: Referring Klyn Kroening: Treating Ercelle Winkles/Extender: RO BSO N, MICHA EL G Self, Referral Weeks in Treatment: 0 Active Inactive Nutrition Nursing Diagnoses: Impaired glucose control: actual or potential Goals: Patient/caregiver will maintain therapeutic glucose control Date Initiated: 03/09/2022 Target Resolution Date: 04/08/2022 Goal Status: Active Interventions: Assess HgA1c results as ordered upon admission and as needed Assess patient nutrition upon admission and as needed per policy Notes: Wound/Skin Impairment Nursing Diagnoses: Knowledge deficit related to ulceration/compromised skin integrity Goals: Patient/caregiver will verbalize understanding of skin care regimen Date Initiated: 03/09/2022 Target Resolution Date: 04/08/2022 Goal Status: Active Ulcer/skin breakdown will have a volume reduction of 30% by week 4 Date Initiated: 03/09/2022 Target  Resolution Date: 05/09/2022 Goal Status: Active Ulcer/skin breakdown will have a volume reduction of 50% by week 8 Date Initiated: 03/09/2022 Target Resolution Date: 06/09/2022 Goal Status: Active Ulcer/skin breakdown will have a volume reduction of 80% by week 12 Date Initiated: 03/09/2022 Target Resolution Date: 07/08/2022 Goal Status: Active Ulcer/skin breakdown will heal within 14 weeks Date Initiated: 03/09/2022 Target Resolution Date: 08/09/2022 JAZMYN, OFFNER ([id number]) [id number] Page 7 of 10 Goal Status: Active Interventions: Assess patient/caregiver ability to obtain necessary supplies Assess patient/caregiver ability to perform ulcer/skin care regimen upon admission and as needed Assess ulceration(s) every visit Notes: Electronic Signature(s) Signed: 03/10/2022 8:11:11 AM By: Carlene Coria RN Entered By: Carlene Coria on 03/09/2022 08:57:49 -------------------------------------------------------------------------------- Pain Assessment Details Patient Name: Date of Service: [facility] RD, [place]. 03/09/2022 8:45 A M Medical Record Number: [id number] Patient Account Number: [id number] Date of Birth/Sex: Treating RN: Aug 10, 1940 (  81 y.o. [name] Primary Care Shakyra Mattera: Tommi Rumps Other Clinician: Referring Alisan Dokes: Treating Jamarien Rodkey/Extender: RO BSO N, [place] EL G Self, Referral Weeks in Treatment: 0 Active Problems Location of Pain Severity and Description of Pain Patient Has Paino No Site Locations Pain Management and Medication Current Pain Management: Electronic Signature(s) Signed: 03/10/2022 8:11:11 AM By: Carlene Coria RN Entered By: Carlene Coria on 03/09/2022 08:33:33 [name] ([id number]) [id number] Page 8 of 10 -------------------------------------------------------------------------------- Patient/Caregiver Education Details Patient Name: Date of Service: [facility] RD, [place] RGA  [name] 11/1/2023andnbsp8:45 A M Medical Record Number: [id number] Patient Account Number: [id number] Date of Birth/Gender: Treating RN: May 02, 1941 (81 y.o. [name] Primary Care Physician: Tommi Rumps Other Clinician: Referring Physician: Treating Physician/Extender: RO BSO N, [place] EL G Self, Referral Weeks in Treatment: 0 Education Assessment Education Provided To: Patient Education Topics Provided Wound/Skin Impairment: Methods: Explain/Verbal Responses: State content correctly Electronic Signature(s) Signed: 03/10/2022 8:11:11 AM By: Carlene Coria RN Entered By: Carlene Coria on 03/09/2022 10:17:56 -------------------------------------------------------------------------------- Wound Assessment Details Patient Name: Date of Service: [facility] RD, [place] RGA [name] 03/09/2022 8:45 A M Medical Record Number: [id number] Patient Account Number: [id number] Date of Birth/Sex: Treating RN: 1941-01-29 (81 y.o. [name] Primary Care Deyci Gesell: Tommi Rumps Other Clinician: Referring Kasem Mozer: Treating Destyne Goodreau/Extender: RO BSO N, [place] EL G Self, Referral Weeks in Treatment: 0 Wound Status Wound Number: 2 Primary Open Surgical Wound Etiology: Wound Location: Right, Anterior Lower Leg Wound Open Wounding Event: Surgical Injury Status: Date Acquired: 02/23/2022 Comorbid Sleep Apnea, Hypertension, Type II Diabetes, End Stage Renal Weeks Of Treatment: 0 History: Disease, Osteoarthritis, Neuropathy Clustered Wound: No Photos JULIEANNE, HADSALL ([id number]) [id number] Page 9 of 10 Wound Measurements Length: (cm) 4 Width: (cm) 0.3 Depth: (cm) 0.2 Area: (cm) 0.942 Volume: (cm) 0.188 % Reduction in Area: % Reduction in Volume: Epithelialization: None Tunneling: No Undermining: No Wound Description Classification: Full Thickness Without Exposed Support Exudate Amount: Medium Exudate Type: Serosanguineous Exudate Color: red,  brown Structures Foul Odor After Cleansing: No Slough/Fibrino Yes Wound Bed Granulation Amount: Medium (34-66%) Exposed Structure Granulation Quality: Pink Fascia Exposed: No Necrotic Amount: Medium (34-66%) Fat Layer (Subcutaneous Tissue) Exposed: Yes Necrotic Quality: Adherent Slough Tendon Exposed: No Muscle Exposed: No Joint Exposed: No Bone Exposed: No Treatment Notes Wound #2 (Lower Leg) Wound Laterality: Right, Anterior Cleanser Peri-Wound Care Topical Primary Dressing Silvercel 4 1/4x 4 1/4 (in/in) Discharge Instruction: Apply Silvercel 4 1/4x 4 1/4 (in/in) as instructed Secondary Dressing (BORDER) Zetuvit Plus SILICONE BORDER Dressing 4x4 (in/in) Discharge Instruction: Please do not put silicone bordered dressings under wraps. Use non-bordered dressing only. Secured With Compression Wrap Compression Stockings Add-Ons Electronic Signature(s) Signed: 03/10/2022 8:11:11 AM By: Carlene Coria RN Entered By: Carlene Coria on 03/09/2022 08:45:02 [name] ([id number]) [id number] Page 10 of 10 -------------------------------------------------------------------------------- Vitals Details Patient Name: Date of Service: [facility] RD, [name] 03/09/2022 8:45 A M Medical Record Number: [id number] Patient Account Number: [id number] Date of Birth/Sex: Treating RN: Sep 12, 1940 (81 y.o. [name] Primary Care Carlous Olivares: Tommi Rumps Other Clinician: Referring Halford Goetzke: Treating Buzz Axel/Extender: RO BSO N, MICHA EL G Self, Referral Weeks in Treatment: 0 Vital Signs Time Taken: 08:33 Temperature (F): 97.9 Height (in): 63 Pulse (bpm): 84 Source: Stated Respiratory Rate (breaths/min): 18 Weight (lbs): 120 Blood Pressure (mmHg): 163/75 Source: Stated Reference Range: 80 - 120 mg / dl Body Mass Index (BMI): 21.3 Electronic Signature(s) Signed: 03/10/2022 8:11:11 AM By: Carlene Coria RN Entered By: Carlene Coria on 03/09/2022  08:34:06

## 2022-03-10 NOTE — Progress Notes (Signed)
Kelly, [name] ([id number]) [id number] Page 1 of 5 Visit Report for 03/09/2022 Abuse Risk Screen Details Patient Name: Date of Service: Kelly [name], [name] 03/09/2022 8:45 A M Medical Record Number: [id number] Patient Account Number: [id number] Date of Birth/Sex: Treating RN: November 04, 1940 (81 y.o. [name] Primary Care Noraa Pickeral: Tommi Rumps Other Clinician: Referring Alean Kromer: Treating Zuri Bradway/Extender: RO BSO N, MICHA EL G Self, Referral Weeks in Treatment: 0 Abuse Risk Screen Items Answer ABUSE RISK SCREEN: Has anyone close to you tried to hurt or harm you recentlyo No Do you feel uncomfortable with anyone in your familyo No Has anyone forced you do things that you didnt want to doo No Electronic Signature(s) Signed: 03/10/2022 8:11:11 AM By: Carlene Coria RN Entered By: Carlene Coria on 03/09/2022 08:35:57 -------------------------------------------------------------------------------- Activities of Daily Living Details Patient Name: Date of Service: Kelly [name], [name] 03/09/2022 8:45 A M Medical Record Number: [id number] Patient Account Number: [id number] Date of Birth/Sex: Treating RN: 1940/07/13 (81 y.o. [name] Primary Care Cayman Kielbasa: Tommi Rumps Other Clinician: Referring Borden Thune: Treating Jadarius Commons/Extender: RO BSO N, MICHA EL G Self, Referral Weeks in Treatment: 0 Activities of Daily Living Items Answer Activities of Daily Living (Please select one for each item) Drive Automobile Completely Able T Medications ake Completely Able Use T elephone Completely Able Care for Appearance Completely Able Use T oilet Completely Able Bath / Shower Completely Able Dress Self Completely Able Feed Self Completely Able Walk Completely Able Get In / Out Bed Completely Able Housework Completely Able DELAINY, MCELHINEY ([id number]) [id number] Page 2 of 5 Prepare Meals  Completely Able Handle Money Completely Able Shop for Self Completely Able Electronic Signature(s) Signed: 03/10/2022 8:11:11 AM By: Carlene Coria RN Entered By: Carlene Coria on 03/09/2022 51:76:16 -------------------------------------------------------------------------------- Education Screening Details Patient Name: Date of Service: Kelly [name], Kelly [name] RGA [name] 03/09/2022 8:45 A M Medical Record Number: [id number] Patient Account Number: [id number] Date of Birth/Sex: Treating RN: September 03, 1940 (81 y.o. [name] Primary Care Tahesha Skeet: Tommi Rumps Other Clinician: Referring Kathy Wahid: Treating Satina Jerrell/Extender: RO BSO N, MICHA EL G Self, Referral Weeks in Treatment: 0 Primary Learner Assessed: Patient Learning Preferences/Education Level/Primary Language Learning Preference: Explanation Highest Education Level: High School Preferred Language: English Cognitive Barrier Language Barrier: No Translator Needed: No Memory Deficit: No Emotional Barrier: No Cultural/Religious Beliefs Affecting Medical Care: No Physical Barrier Impaired Vision: No Impaired Hearing: Yes Hearing Aid, both ears Decreased Hand dexterity: No Knowledge/Comprehension Knowledge Level: High Comprehension Level: High Ability to understand written instructions: High Ability to understand verbal instructions: High Motivation Anxiety Level: Anxious Cooperation: Cooperative Education Importance: Acknowledges Need Interest in Health Problems: Asks Questions Perception: Coherent Willingness to Engage in Self-Management High Activities: Readiness to Engage in Self-Management High Activities: Electronic Signature(s) Signed: 03/10/2022 8:11:11 AM By: Carlene Coria RN Entered By: Carlene Coria on 03/09/2022 08:36:55 [name] ([id number]) [id number] Page 3 of 5 -------------------------------------------------------------------------------- Fall Risk Assessment  Details Patient Name: Date of Service: [name], [name] 03/09/2022 8:45 A M Medical Record Number: [id number] Patient Account Number: [id number] Date of Birth/Sex: Treating RN: 21-Jul-1940 (81 y.o. [name] Primary Care Simar Pothier: Tommi Rumps Other Clinician: Referring Parry Po: Treating Jamani Eley/Extender: RO BSO N, MICHA EL G Self, Referral Weeks in Treatment: 0 Fall Risk Assessment Items Have you had 2 or more falls in the last 12 monthso 0 No Have you had any fall that resulted in injury in the last 12 monthso 0 No FALLS RISK SCREEN  History of falling - immediate or within 3 months 0 No Secondary diagnosis (Do you have 2 or more medical diagnoseso) 0 No Ambulatory aid None/bed rest/wheelchair/nurse 0 No Crutches/cane/walker 0 No Furniture 0 No Intravenous therapy Access/Saline/Heparin Lock 0 No Gait/Transferring Normal/ bed rest/ wheelchair 0 No Weak (short steps with or without shuffle, stooped but able to lift head while walking, may seek 0 No support from furniture) Impaired (short steps with shuffle, may have difficulty arising from chair, head down, impaired 0 No balance) Mental Status Oriented to own ability 0 No Electronic Signature(s) Signed: 03/10/2022 8:11:11 AM By: Carlene Coria RN Entered By: Carlene Coria on 03/09/2022 08:37:03 -------------------------------------------------------------------------------- Foot Assessment Details Patient Name: Date of Service: Kelly [name], Kelly [name] RGA [name] 03/09/2022 8:45 A M Medical Record Number: [id number] Patient Account Number: [id number] Date of Birth/Sex: Treating RN: 1940/11/22 (81 y.o. [name] Primary Care Masiyah Engen: Tommi Rumps Other Clinician: Referring Carly Applegate: Treating Elizjah Noblet/Extender: RO BSO N, MICHA EL G Self, Referral Weeks in Treatment: 0 Foot Assessment Items Site Locations [name], [name] ([id number]) [id number] Page 4 of 5 + = Sensation  present, - = Sensation absent, C = Callus, U = Ulcer R = Redness, W = Warmth, M = Maceration, PU = Pre-ulcerative lesion F = Fissure, S = Swelling, D = Dryness Assessment Right: Left: Other Deformity: No No Prior Foot Ulcer: No No Prior Amputation: No No Charcot Joint: No No Ambulatory Status: Ambulatory Without Help Gait: Steady Electronic Signature(s) Signed: 03/10/2022 8:11:11 AM By: Carlene Coria RN Entered By: Carlene Coria on 03/09/2022 08:38:39 -------------------------------------------------------------------------------- Nutrition Risk Screening Details Patient Name: Date of Service: Kelly [name], Kelly [name] RGA [name] 03/09/2022 8:45 A M Medical Record Number: [id number] Patient Account Number: [id number] Date of Birth/Sex: Treating RN: Jun 26, 1940 (81 y.o. [name] Primary Care Shallen Luedke: Tommi Rumps Other Clinician: Referring Calee Nugent: Treating Amantha Sklar/Extender: RO BSO N, MICHA EL G Self, Referral Weeks in Treatment: 0 Height (in): 63 Weight (lbs): 120 Body Mass Index (BMI): 21.3 Nutrition Risk Screening Items Score Screening NUTRITION RISK SCREEN: I have an illness or condition that made me change the kind and/or amount of food I eat 2 Yes I eat fewer than two meals per day 0 No I eat few fruits and vegetables, or milk products 0 No I have three or more drinks of beer, liquor or wine almost every day 0 No I have tooth or mouth problems that make it hard for me to eat 0 No I don't always have enough money to buy the food I need 0 No Kimberley, Cheria G ([id number]) [id number] Page 5 of 5 I eat alone most of the time 0 No I take three or more different prescribed or over-the-counter drugs a day 1 Yes Without wanting to, I have lost or gained 10 pounds in the last six months 0 No I am not always physically able to shop, cook and/or feed myself 0 No Nutrition Protocols Good Risk Protocol Moderate Risk Protocol 0 Provide education  on nutrition High Risk Proctocol Risk Level: Moderate Risk Score: 3 Electronic Signature(s) Signed: 03/10/2022 8:11:11 AM By: Carlene Coria RN Entered By: Carlene Coria on 03/09/2022 08:37:20

## 2022-03-16 ENCOUNTER — Encounter (HOSPITAL_BASED_OUTPATIENT_CLINIC_OR_DEPARTMENT_OTHER): Payer: Medicare Other | Admitting: Internal Medicine

## 2022-03-16 ENCOUNTER — Ambulatory Visit: Payer: Medicare Other | Admitting: Internal Medicine

## 2022-03-16 DIAGNOSIS — E1122 Type 2 diabetes mellitus with diabetic chronic kidney disease: Secondary | ICD-10-CM | POA: Diagnosis not present

## 2022-03-16 DIAGNOSIS — T8131XD Disruption of external operation (surgical) wound, not elsewhere classified, subsequent encounter: Secondary | ICD-10-CM

## 2022-03-16 DIAGNOSIS — I87311 Chronic venous hypertension (idiopathic) with ulcer of right lower extremity: Secondary | ICD-10-CM | POA: Diagnosis not present

## 2022-03-16 DIAGNOSIS — L97818 Non-pressure chronic ulcer of other part of right lower leg with other specified severity: Secondary | ICD-10-CM

## 2022-03-16 DIAGNOSIS — I120 Hypertensive chronic kidney disease with stage 5 chronic kidney disease or end stage renal disease: Secondary | ICD-10-CM | POA: Diagnosis not present

## 2022-03-16 DIAGNOSIS — E11622 Type 2 diabetes mellitus with other skin ulcer: Secondary | ICD-10-CM | POA: Diagnosis not present

## 2022-03-21 NOTE — Progress Notes (Signed)
Kelly [name], Kelly [name] ([id number]) [id number] Page 1 of 7 Visit Report for 03/16/2022 Chief Complaint Document Details Patient Name: Date of Service: Kelly [name] RD, [place] RGA RETTE [name]. 03/16/2022 1:15 PM Medical Record Number: [id number] Patient Account Number: [id number] Date of Birth/Sex: Treating RN: [birth year] (81 y.o. Kelly [name] Primary Care Provider: Tommi Rumps Other Clinician: Referring Provider: Treating Provider/Extender: Conni Slipper in Treatment: 1 Information Obtained from: Patient Chief Complaint Right LE Ulcer 03/09/2022; patient is here for review of a wound on the right anterior mid tibia which is a surgical wound Electronic Signature(s) Signed: 03/16/2022 3:55:12 PM By: Kalman Shan DO Entered By: Kalman Shan on 03/16/2022 14:23:45 -------------------------------------------------------------------------------- Debridement Details Patient Name: Date of Service: SHEPPA RD, [place] RET[place] [name]. 03/16/2022 1:15 PM Medical Record Number: [id number] Patient Account Number: [id number] Date of Birth/Sex: Treating RN: [birth year] (81 y.o. Kelly [name] Primary Care Provider: Tommi Rumps Other Clinician: Referring Provider: Treating Provider/Extender: Conni Slipper in Treatment: 1 Debridement Performed for Assessment: Wound #2 Right,Anterior Lower Leg Performed By: Physician Kalman Shan, MD Debridement Type: Debridement Level of Consciousness (Pre-procedure): Awake and Alert Pre-procedure Verification/Time Out Yes - 13:45 Taken: Start Time: 13:45 T Area Debrided (L x W): otal 2.2 (cm) x 0.3 (cm) = 0.66 (cm) Tissue and other material debrided: Viable, Non-Viable, Slough, Subcutaneous, Skin: Dermis , Skin: Epidermis, Slough Level: Skin/Subcutaneous Tissue Debridement Description: Excisional Instrument: Curette Bleeding: Minimum Hemostasis Achieved: Pressure End Time:  13:51 Procedural Pain: 0 Post Procedural Pain: 0 Response to Treatment: Procedure was tolerated well Kelly, NAPPIER [name] ([id number]) [id number] Page 2 of 7 Level of Consciousness (Post- Awake and Alert procedure): Post Debridement Measurements of Total Wound Length: (cm) 2.2 Width: (cm) 0.3 Depth: (cm) 0.2 Volume: (cm) 0.104 Character of Wound/Ulcer Post Debridement: Improved Post Procedure Diagnosis Same as Pre-procedure Electronic Signature(s) Signed: 03/16/2022 3:55:12 PM By: Kalman Shan DO Signed: 03/21/2022 10:26:37 AM By: Carlene Coria RN Entered By: Carlene Coria on 03/16/2022 14:05:33 -------------------------------------------------------------------------------- HPI Details Patient Name: Date of Service: SHEPPA RD, [place] [facility] [facility] [name]. 03/16/2022 1:15 PM Medical Record Number: [id number] Patient Account Number: [id number] Date of Birth/Sex: Treating RN: Apr 09, 1941 (81 y.o. Kelly [name] Primary Care Provider: Tommi Rumps Other Clinician: Referring Provider: Treating Provider/Extender: Conni Slipper in Treatment: 1 History of Present Illness HPI Description: 10-28-2021 upon evaluation today patient appears to be doing somewhat poorly in regard to a wound on her right leg. This happened when she had a concrete birdbath fall and hit her leg on May 16. She went to urgent care 2 days later since that time she has been on 2 rounds of doxycycline as well as a round of clindamycin the good news is I see no evidence of infection at this point. Fortunately there is no signs of active infection locally or systemically which is great news and overall I think you are on the right track as far as healing is concerned I do believe that there are some things we can do to help her out here. The patient does have a history of diabetes mellitus type 2, hypertension, chronic kidney disease stage IV, and chronic venous  insufficiency/hypertension. Her most recent hemoglobin A1c was 8.5 on 09-29-2021. 11-04-2021 upon evaluation today patient appears to be doing well currently in regard to her wound. She has been tolerating the dressing changes without complication. Fortunately I do not see any evidence of infection locally or systemically which is great news. 11-18-2021 upon evaluation today patient appears to  be doing well currently in regard to her wound. She has been tolerating the dressing changes without complication. Fortunately there does not appear to be any evidence of active infection locally or systemically at this time which is great news. No fevers, chills, nausea, vomiting, or diarrhea. READMISSION 03/09/2022 This is a 81 year old woman we actually had in the clinic for visits in June and July of this year. At this point she had a traumatic wound on the right anterior lower leg which ultimately healed. I did not see that she was actually discharged in a healed state from here that being said it did close over. Roughly 2 weeks ago she had a skin cancer removed by her dermatologist Dr. Evorn Gong from an area on the same leg just above the wound area that we were dealing with. It is sutured. She was seen in the urgent care a week ago because of pain and erythema and put on cephalexin for cellulitis although the patient tells me currently she is on doxycycline. She is using Vaseline gauze changing daily. She comes in today using the Vaseline gauze the area around the wound is somewhat macerated slight drainage from the center of this. She does not have a vascular issue her ABIs in the right leg were normal when she was here during the summer Patient is a type II diabetic, hypertension, chronic kidney disease stage IIIb 11/8; patient presents for follow-up. She states she had the sutures removed by her dermatologist yesterday. She is currently on Bactrim. She denies signs of infection today. She has been using  silver alginate to the wound bed. Electronic Signature(s) Signed: 03/16/2022 3:55:12 PM By: Eden Emms ([id number]) [id number] Page 3 of 7 Signed: 03/16/2022 3:55:12 PM By: Kalman Shan DO Entered By: Kalman Shan on 03/16/2022 14:24:18 -------------------------------------------------------------------------------- Physical Exam Details Patient Name: Date of Service: SHEPPA RD, [place] [name]. 03/16/2022 1:15 PM Medical Record Number: [id number] Patient Account Number: [id number] Date of Birth/Sex: Treating RN: 11-09-1940 (81 y.o. Kelly [name] Primary Care Provider: Tommi Rumps Other Clinician: Referring Provider: Treating Provider/Extender: Conni Slipper in Treatment: 1 Constitutional . Cardiovascular . Psychiatric . Notes T the mid tibia there is a vertical wound along an incision site that has slightly dehisced. The surface throughout is nonviable. No probing to bone. Rolled o edges. No surrounding signs of infection including increased warmth, erythema or purulent drainage. Electronic Signature(s) Signed: 03/16/2022 3:55:12 PM By: Kalman Shan DO Entered By: Kalman Shan on 03/16/2022 14:26:14 -------------------------------------------------------------------------------- Physician Orders Details Patient Name: Date of Service: SHEPPA RD, [place] [name]. 03/16/2022 1:15 PM Medical Record Number: [id number] Patient Account Number: [id number] Date of Birth/Sex: Treating RN: 1940-07-13 (81 y.o. Kelly [name] Primary Care Provider: Tommi Rumps Other Clinician: Referring Provider: Treating Provider/Extender: Conni Slipper in Treatment: 1 Verbal / Phone Orders: No Diagnosis Coding Follow-up Appointments Return Appointment in 1 week. Bathing/ Shower/ Hygiene May shower; gently cleanse wound with antibacterial soap, rinse and pat dry prior  to dressing wounds Edema Control - Lymphedema / Segmental Compressive Device / Other TAMAR, MIANO ([id number]) [id number] Page 4 of 7 Elevate, Exercise Daily and A void Standing for Long Periods of Time. Elevate legs to the level of the heart and pump ankles as often as possible Elevate leg(s) parallel to the floor when sitting. Wound Treatment Wound #2 - Lower Leg Wound Laterality: Right, Anterior Prim Dressing: Honey: Activon Honey Gel, 25 ([name]) Tube Every Other Day/30 Days ary  Prim Dressing: Hydrofera Blue Ready Transfer Foam, 2.5x2.5 (in/in) (DME) (Generic) Every Other Day/30 Days ary Discharge Instructions: Apply Hydrofera Blue Ready to wound bed as directed Secondary Dressing: (BORDER) Zetuvit Plus SILICONE BORDER Dressing 4x4 (in/in) (DME) (Generic) Every Other Day/30 Days Discharge Instructions: Please do not put silicone bordered dressings under wraps. Use non-bordered dressing only. Electronic Signature(s) Signed: 03/16/2022 3:55:12 PM By: Kalman Shan DO Previous Signature: 03/16/2022 2:02:44 PM Version By: Kalman Shan DO Entered By: Kalman Shan on 03/16/2022 14:29:30 -------------------------------------------------------------------------------- Problem List Details Patient Name: Date of Service: SHEPPA RD, [place] [facility] [name]. 03/16/2022 1:15 PM Medical Record Number: [id number] Patient Account Number: [id number] Date of Birth/Sex: Treating RN: Sep 03, 1940 (81 y.o. Kelly [name] Primary Care Provider: Tommi Rumps Other Clinician: Referring Provider: Treating Provider/Extender: Conni Slipper in Treatment: 1 Active Problems ICD-10 Encounter Code Description Active Date MDM Diagnosis T81.31XD Disruption of external operation (surgical) wound, not elsewhere classified, 03/09/2022 No Yes subsequent encounter L97.818 Non-pressure chronic ulcer of other part of right lower leg with other specified  03/09/2022 No Yes severity Inactive Problems Resolved Problems Electronic Signature(s) Signed: 03/16/2022 3:55:12 PM By: Kalman Shan DO Entered By: Kalman Shan on 03/16/2022 14:23:38 Kelly [name] ([id number]) [id number] Page 5 of 7 -------------------------------------------------------------------------------- Progress Note Details Patient Name: Date of Service: SHEPPA RD, [place] R[place] RET[place] [name]. 03/16/2022 1:15 PM Medical Record Number: [id number] Patient Account Number: [id number] Date of Birth/Sex: Treating RN: 13-Jan-1941 (81 y.o. Kelly [name] Primary Care Provider: Tommi Rumps Other Clinician: Referring Provider: Treating Provider/Extender: Conni Slipper in Treatment: 1 Subjective Chief Complaint Information obtained from Patient Right LE Ulcer 03/09/2022; patient is here for review of a wound on the right anterior mid tibia which is a surgical wound History of Present Illness (HPI) 10-28-2021 upon evaluation today patient appears to be doing somewhat poorly in regard to a wound on her right leg. This happened when she had a concrete birdbath fall and hit her leg on May 16. She went to urgent care 2 days later since that time she has been on 2 rounds of doxycycline as well as a round of clindamycin the good news is I see no evidence of infection at this point. Fortunately there is no signs of active infection locally or systemically which is great news and overall I think you are on the right track as far as healing is concerned I do believe that there are some things we can do to help her out here. The patient does have a history of diabetes mellitus type 2, hypertension, chronic kidney disease stage IV, and chronic venous insufficiency/hypertension. Her most recent hemoglobin A1c was 8.5 on 09-29-2021. 11-04-2021 upon evaluation today patient appears to be doing well currently in regard to her wound. She has been  tolerating the dressing changes without complication. Fortunately I do not see any evidence of infection locally or systemically which is great news. 11-18-2021 upon evaluation today patient appears to be doing well currently in regard to her wound. She has been tolerating the dressing changes without complication. Fortunately there does not appear to be any evidence of active infection locally or systemically at this time which is great news. No fevers, chills, nausea, vomiting, or diarrhea. READMISSION 03/09/2022 This is a 81 year old woman we actually had in the clinic for visits in June and July of this year. At this point she had a traumatic wound on the right anterior lower leg which ultimately healed. I did not see that she was  actually discharged in a healed state from here that being said it did close over. Roughly 2 weeks ago she had a skin cancer removed by her dermatologist Dr. Evorn Gong from an area on the same leg just above the wound area that we were dealing with. It is sutured. She was seen in the urgent care a week ago because of pain and erythema and put on cephalexin for cellulitis although the patient tells me currently she is on doxycycline. She is using Vaseline gauze changing daily. She comes in today using the Vaseline gauze the area around the wound is somewhat macerated slight drainage from the center of this. She does not have a vascular issue her ABIs in the right leg were normal when she was here during the summer Patient is a type II diabetic, hypertension, chronic kidney disease stage IIIb 11/8; patient presents for follow-up. She states she had the sutures removed by her dermatologist yesterday. She is currently on Bactrim. She denies signs of infection today. She has been using silver alginate to the wound bed. Objective Constitutional Vitals Time Taken: 1:27 PM, Height: 63 in, Weight: 120 lbs, BMI: 21.3, Temperature: 98.3 F, Pulse: 69 bpm, Respiratory Rate: 16  breaths/min, Blood Pressure: 168/74 mmHg. General Notes: T the mid tibia there is a vertical wound along an incision site that has slightly dehisced. The surface throughout is nonviable. No probing to o bone. Rolled edges. No surrounding signs of infection including increased warmth, erythema or purulent drainage. Integumentary (Hair, Skin) Wound #2 status is Open. Original cause of wound was Surgical Injury. The date acquired was: 02/23/2022. The wound has been in treatment 1 weeks. The wound is located on the Right,Anterior Lower Leg. The wound measures 2.2cm length x 0.3cm width x 0.2cm depth; 0.518cm^2 area and 0.104cm^3 volume. There is Fat Layer (Subcutaneous Tissue) exposed. There is no tunneling or undermining noted. There is a medium amount of serosanguineous drainage noted. There is medium (34-66%) pink granulation within the wound bed. There is a medium (34-66%) amount of necrotic tissue within the wound bed including Adherent Kelly [name], Kelly [name] ([id number]) [id number] Page 6 of [place]. Assessment Active Problems ICD-10 Disruption of external operation (surgical) wound, not elsewhere classified, subsequent encounter Non-pressure chronic ulcer of other part of right lower leg with other specified severity Patient's sutures were removed yesterday. There is slight dehiscence to the wound. I debrided nonviable tissue. Including the rolled edges. At this time I recommended Medihoney with Hydrofera Blue. Follow-up in 1 week. Procedures Wound #2 Pre-procedure diagnosis of Wound #2 is an Open Surgical Wound located on the Right,Anterior Lower Leg . There was a Excisional Skin/Subcutaneous Tissue Debridement with a total area of 0.66 sq cm performed by Kalman Shan, MD. With the following instrument(s): Curette to remove Viable and Non-Viable tissue/material. Material removed includes Subcutaneous Tissue, Slough, Skin: Dermis, and Skin: Epidermis. No  specimens were taken. A time out was conducted at 13:45, prior to the start of the procedure. A Minimum amount of bleeding was controlled with Pressure. The procedure was tolerated well with a pain level of 0 throughout and a pain level of 0 following the procedure. Post Debridement Measurements: 2.2cm length x 0.3cm width x 0.2cm depth; 0.104cm^3 volume. Character of Wound/Ulcer Post Debridement is improved. Post procedure Diagnosis Wound #2: Same as Pre-Procedure Plan Follow-up Appointments: Return Appointment in 1 week. Bathing/ Shower/ Hygiene: May shower; gently cleanse wound with antibacterial soap, rinse and pat dry prior to dressing wounds Edema Control - Lymphedema / Segmental  Compressive Device / Other: Elevate, Exercise Daily and Avoid Standing for Long Periods of Time. Elevate legs to the level of the heart and pump ankles as often as possible Elevate leg(s) parallel to the floor when sitting. WOUND #2: - Lower Leg Wound Laterality: Right, Anterior Prim Dressing: Honey: Activon Honey Gel, 25 ([name]) Tube Every Other Day/30 Days ary Prim Dressing: Hydrofera Blue Ready Transfer Foam, 2.5x2.5 (in/in) (DME) (Generic) Every Other Day/30 Days ary Discharge Instructions: Apply Hydrofera Blue Ready to wound bed as directed Secondary Dressing: (BORDER) Zetuvit Plus SILICONE BORDER Dressing 4x4 (in/in) (DME) (Generic) Every Other Day/30 Days Discharge Instructions: Please do not put silicone bordered dressings under wraps. Use non-bordered dressing only. 1. In office sharp debridement 2. Medihoney and Hydrofera Blue 3. Follow-up in 1 week Electronic Signature(s) Signed: 03/16/2022 3:55:12 PM By: Kalman Shan DO Entered By: Kalman Shan on 03/16/2022 14:29:07 SuperBill Details -------------------------------------------------------------------------------- Kelly [name] ([id number]) [id number] Page 7 of 7 Patient Name: Date of Service: [facility] [name]  RD, [place]. 03/16/2022 Medical Record Number: [id number] Patient Account Number: [id number] Date of Birth/Sex: Treating RN: 03/07/1941 (81 y.o. Kelly [name] Primary Care Provider: Tommi Rumps Other Clinician: Referring Provider: Treating Provider/Extender: Conni Slipper in Treatment: 1 Diagnosis Coding ICD-10 Codes Code Description T81.31XD Disruption of external operation (surgical) wound, not elsewhere classified, subsequent encounter L97.818 Non-pressure chronic ulcer of other part of right lower leg with other specified severity Facility Procedures CPT4 Code Description Modifier Quantity [id number] - DEB SUBQ TISSUE 20 SQ CM/< 1 ICD-10 Diagnosis Description L97.818 Non-pressure chronic ulcer of other part of right lower leg with other specified severity T81.31XD Disruption of external operation (surgical) wound, not elsewhere classified, subsequent encounter Physician Procedures Quantity CPT4 Code Description Modifier [id number] - WC PHYS SUBQ TISS 20 SQ CM 1 ICD-10 Diagnosis Description L97.818 Non-pressure chronic ulcer of other part of right lower leg with other specified severity T81.31XD Disruption of external operation (surgical) wound, not elsewhere classified, subsequent encounter Electronic Signature(s) Signed: 03/16/2022 3:55:12 PM By: Kalman Shan DO Entered By: Kalman Shan on 03/16/2022 14:29:21

## 2022-03-21 NOTE — Progress Notes (Signed)
ALVEENA, TAIRA ([id number]) [id number] Page 1 of 9 Visit Report for 03/16/2022 Arrival Information Details Patient Name: Date of Service: Kelly [name], Kelly Kelly [name]. 03/16/2022 1:15 PM Medical Record Number: [id number] Patient Account Number: [id number] Date of Birth/Sex: Treating RN: April 23, 1941 (81 y.o. Kelly [name] Primary Care Kelly [name]: Kelly [name] Other Clinician: Referring Kelly [name]: Treating Kelly [name]/Extender: Kelly [name] in Treatment: 1 Visit Information History Since Last Visit All ordered tests and consults were completed: No Patient Arrived: Ambulatory Added or deleted any medications: No Arrival Time: 13:24 Any new allergies or adverse reactions: No Accompanied By: daughter Had a fall or experienced change in No Transfer Assistance: None activities of daily living that may affect Patient Identification Verified: Yes risk of falls: Secondary Verification Process Completed: Yes Signs or symptoms of abuse/neglect since last visito No Patient Requires Transmission-Based Precautions: No Hospitalized since last visit: No Patient Has Alerts: Yes Implantable device outside of the clinic excluding No Patient Alerts: ABI left 1.15 10/28/21 cellular tissue based products placed in the center ABI right 1.13 10/28/21 since last visit: Has Dressing in Place as Prescribed: Yes Pain Present Now: No Electronic Signature(s) Signed: 03/21/2022 10:26:37 AM By: Kelly Coria RN Entered By: Kelly [name] on 03/16/2022 13:27:12 -------------------------------------------------------------------------------- Clinic Level of Care Assessment Details Patient Name: Date of Service: Kelly [name]. 03/16/2022 1:15 PM Medical Record Number: [id number] Patient Account Number: [id number] Date of Birth/Sex: Treating RN: 1940-12-03 (81 y.o. Kelly [name] Primary Care Tyshauna Finkbiner: Kelly [name] Other Clinician: Referring  Kelly [name]: Treating Meredeth Furber/Extender: Kelly [name] in Treatment: 1 Clinic Level of Care Assessment Items TOOL 1 Quantity Score '[]'$  - 0 Use when EandM and Procedure is performed on INITIAL visit ASSESSMENTS - Nursing Assessment / Reassessment '[]'$  - 0 General Physical Exam (combine w/ comprehensive assessment (listed just below) when performed on new pt. evals) '[]'$  - 0 Comprehensive Assessment (HX, ROS, Risk Assessments, Wounds Hx, etc.) [name], [name] ([id number]) [id number] Page 2 of 9 ASSESSMENTS - Wound and Skin Assessment / Reassessment '[]'$  - 0 Dermatologic / Skin Assessment (not related to wound area) ASSESSMENTS - Ostomy and/or Continence Assessment and Care '[]'$  - 0 Incontinence Assessment and Management '[]'$  - 0 Ostomy Care Assessment and Management (repouching, etc.) PROCESS - Coordination of Care '[]'$  - 0 Simple Patient / Family Education for ongoing care '[]'$  - 0 Complex (extensive) Patient / Family Education for ongoing care '[]'$  - 0 Staff obtains Programmer, systems, Records, T Results / Process Orders est '[]'$  - 0 Staff telephones HHA, Nursing Homes / Clarify orders / etc '[]'$  - 0 Routine Transfer to another Facility (non-emergent condition) '[]'$  - 0 Routine Hospital Admission (non-emergent condition) '[]'$  - 0 New Admissions / Biomedical engineer / Ordering NPWT Apligraf, etc. , '[]'$  - 0 Emergency Hospital Admission (emergent condition) PROCESS - Special Needs '[]'$  - 0 Pediatric / Minor Patient Management '[]'$  - 0 Isolation Patient Management '[]'$  - 0 Hearing / Language / Visual special needs '[]'$  - 0 Assessment of Community assistance (transportation, D/C planning, etc.) '[]'$  - 0 Additional assistance / Altered mentation '[]'$  - 0 Support Surface(s) Assessment (bed, cushion, seat, etc.) INTERVENTIONS - Miscellaneous '[]'$  - 0 External ear exam '[]'$  - 0 Patient Transfer (multiple staff / Civil Service fast streamer / Similar devices) '[]'$  - 0 Simple  Staple / Suture removal (25 or less) '[]'$  - 0 Complex Staple / Suture removal (26 or more) '[]'$  - 0 Hypo/Hyperglycemic Management (do not check if billed separately) '[]'$  - 0 Ankle /  Brachial Index (ABI) - do not check if billed separately Has the patient been seen at the hospital within the last three years: Yes Total Score: 0 Level Of Care: ____ Electronic Signature(s) Signed: 03/21/2022 10:26:37 AM By: Kelly Coria RN Entered By: Kelly [name] on 03/16/2022 14:05:41 -------------------------------------------------------------------------------- Encounter Discharge Information Details Patient Name: Date of Service: Kelly RD, Kelly [name] Kelly [name]. 03/16/2022 1:15 PM Medical Record Number: [id number] Patient Account Number: [id number] Date of Birth/Sex: Treating RN: 09-01-1940 (81 y.o. Kelly [name] Primary Care Claressa Hughley: Kelly [name] Other Clinician: Referring Tonisha Silvey: Treating Hillard Goodwine/Extender: Nelda [place], Milinda Pointer ([id number]) [id number] Page 3 of 9 Weeks in Treatment: 1 Encounter Discharge Information Items Post Procedure Vitals Discharge Condition: Stable Temperature (F): 98.3 Ambulatory Status: Ambulatory Pulse (bpm): 69 Discharge Destination: Home Respiratory Rate (breaths/min): 16 Transportation: Private Auto Blood Pressure (mmHg): 168/74 Accompanied By: daughter Schedule Follow-up Appointment: Yes Clinical Summary of Care: Electronic Signature(s) Signed: 03/21/2022 10:26:37 AM By: Kelly Coria RN Entered By: Kelly [name] on 03/16/2022 14:07:07 -------------------------------------------------------------------------------- Lower Extremity Assessment Details Patient Name: Date of Service: Kelly RD, Kelly [name] Kelly [name]. 03/16/2022 1:15 PM Medical Record Number: [id number] Patient Account Number: [id number] Date of Birth/Sex: Treating RN: October 22, 1940 (81 y.o. Kelly [name] Primary Care Iesha Summerhill: Kelly [name]  Other Clinician: Referring Merial Moritz: Treating Lindalee Huizinga/Extender: Kelly [name] in Treatment: 1 Edema Assessment Assessed: [Left: No] [Right: No] [Left: Edema] [Right: :] Calf Left: Right: Point of Measurement: 32 cm From Medial Instep 29 cm Ankle Left: Right: Point of Measurement: 10 cm From Medial Instep 19 cm Knee To Floor Left: Right: From Medial Instep 40 cm Vascular Assessment Pulses: Dorsalis Pedis Palpable: [Right:Yes] Electronic Signature(s) Signed: 03/21/2022 10:26:37 AM By: Kelly Coria RN Entered By: Kelly [name] on 03/16/2022 13:34:56 Kelly [name] ([id number]) [id number] Page 4 of 9 -------------------------------------------------------------------------------- Multi Wound Chart Details Patient Name: Date of Service: Kelly [name], Kelly Kelly [name]. 03/16/2022 1:15 PM Medical Record Number: [id number] Patient Account Number: [id number] Date of Birth/Sex: Treating RN: 1941/01/26 (81 y.o. Kelly [name] Primary Care Vinton Layson: Kelly [name] Other Clinician: Referring Jordyn Hofacker: Treating Geovanie Winnett/Extender: Kelly [name] in Treatment: 1 Vital Signs Height(in): 63 Pulse(bpm): 55 Weight(lbs): 120 Blood Pressure(mmHg): 168/74 Body Mass Index(BMI): 21.3 Temperature(F): 98.3 Respiratory Rate(breaths/min): 16 [2:Photos:] [N/A:N/A] Right, Anterior Lower Leg N/A N/A Wound Location: Surgical Injury N/A N/A Wounding Event: Open Surgical Wound N/A N/A Primary Etiology: Sleep Apnea, Hypertension, Type II N/A N/A Comorbid History: Diabetes, End Stage Renal Disease, Osteoarthritis, Neuropathy 02/23/2022 N/A N/A Date Acquired: 1 N/A N/A Weeks of Treatment: Open N/A N/A Wound Status: No N/A N/A Wound Recurrence: 2.2x0.3x0.2 N/A N/A Measurements L x W x D (cm) 0.518 N/A N/A A (cm) : rea 0.104 N/A N/A Volume (cm) : 45.00% N/A N/A % Reduction in Area: 44.70% N/A N/A %  Reduction in Volume: Full Thickness Without Exposed N/A N/A Classification: Support Structures Medium N/A N/A Exudate Amount: Serosanguineous N/A N/A Exudate Type: red, brown N/A N/A Exudate Color: Medium (34-66%) N/A N/A Granulation Amount: Pink N/A N/A Granulation Quality: Medium (34-66%) N/A N/A Necrotic Amount: Fat Layer (Subcutaneous Tissue): Yes N/A N/A Exposed Structures: Fascia: No Tendon: No Muscle: No Joint: No Bone: No None N/A N/A Epithelialization: Treatment Notes Electronic Signature(s) Signed: 03/21/2022 10:26:37 AM By: Kelly Coria RN Entered By: Kelly [name] on 03/16/2022 13:36:12 Kelly [name] ([id number]) [id number] Page 5 of 9 -------------------------------------------------------------------------------- Multi-Disciplinary Care Plan Details Patient Name: Date of Service: Kelly RD, Kelly [name] Kelly [name]. 03/16/2022 1:15 PM  Medical Record Number: [id number] Patient Account Number: [id number] Date of Birth/Sex: Treating RN: 13-Oct-1940 (81 y.o. Kelly [name] Primary Care Laron Boorman: Kelly [name] Other Clinician: Referring Ruthe Roemer: Treating Trace Cederberg/Extender: Kelly [name] in Treatment: 1 Active Inactive Nutrition Nursing Diagnoses: Impaired glucose control: actual or potential Goals: Patient/caregiver will maintain therapeutic glucose control Date Initiated: 03/09/2022 Target Resolution Date: 04/08/2022 Goal Status: Active Interventions: Assess HgA1c results as ordered upon admission and as needed Assess patient nutrition upon admission and as needed per policy Notes: Wound/Skin Impairment Nursing Diagnoses: Knowledge deficit related to ulceration/compromised skin integrity Goals: Patient/caregiver will verbalize understanding of skin care regimen Date Initiated: 03/09/2022 Target Resolution Date: 04/08/2022 Goal Status: Active Ulcer/skin breakdown will have a volume reduction of 30%  by week 4 Date Initiated: 03/09/2022 Target Resolution Date: 05/09/2022 Goal Status: Active Ulcer/skin breakdown will have a volume reduction of 50% by week 8 Date Initiated: 03/09/2022 Target Resolution Date: 06/09/2022 Goal Status: Active Ulcer/skin breakdown will have a volume reduction of 80% by week 12 Date Initiated: 03/09/2022 Target Resolution Date: 07/08/2022 Goal Status: Active Ulcer/skin breakdown will heal within 14 weeks Date Initiated: 03/09/2022 Target Resolution Date: 08/09/2022 Goal Status: Active Interventions: Assess patient/caregiver ability to obtain necessary supplies Assess patient/caregiver ability to perform ulcer/skin care regimen upon admission and as needed Assess ulceration(s) every visit Notes: Electronic Signature(s) Signed: 03/21/2022 10:26:37 AM By: Kelly Coria RN Entered By: Kelly [name] on 03/16/2022 13:36:04 Kelly [name] ([id number]) [id number] Page 6 of 9 -------------------------------------------------------------------------------- Pain Assessment Details Patient Name: Date of Service: Kelly [name], Kelly Kelly [name]. 03/16/2022 1:15 PM Medical Record Number: [id number] Patient Account Number: [id number] Date of Birth/Sex: Treating RN: 01-Aug-1940 (81 y.o. Kelly [name] Primary Care Kathleen Likins: Kelly [name] Other Clinician: Referring Jaella Weinert: Treating Setsuko Robins/Extender: Kelly [name] in Treatment: 1 Active Problems Location of Pain Severity and Description of Pain Patient Has Paino No Site Locations Pain Management and Medication Current Pain Management: Electronic Signature(s) Signed: 03/21/2022 10:26:37 AM By: Kelly Coria RN Entered By: Kelly [name] on 03/16/2022 13:27:32 -------------------------------------------------------------------------------- Patient/Caregiver Education Details Patient Name: Date of Service: Kelly Bowens [name], Kelly [name] 11/8/2023andnbsp1:15 PM Medical  Record Number: [id number] Patient Account Number: [id number] Date of Birth/Gender: Treating RN: 13-Jun-1940 (81 y.o. Kelly [name] Primary Care Physician: Kelly [name] Other Clinician: Referring Physician: Treating Physician/Extender: Kelly [name] in Treatment: [place], Milinda Pointer ([id number]) [id number] Page 7 of 9 Education Assessment Education Provided To: Patient Education Topics Provided Wound/Skin Impairment: Methods: Explain/Verbal Responses: State content correctly Electronic Signature(s) Signed: 03/21/2022 10:26:37 AM By: Kelly Coria RN Entered By: Kelly [name] on 03/16/2022 14:05:52 -------------------------------------------------------------------------------- Wound Assessment Details Patient Name: Date of Service: Kelly RD, Kelly [name] Kelly [name]. 03/16/2022 1:15 PM Medical Record Number: [id number] Patient Account Number: [id number] Date of Birth/Sex: Treating RN: [birth year] (81 y.o. Kelly [name] Primary Care Loriann Bosserman: Kelly [name] Other Clinician: Referring Kennie Karapetian: Treating Ludy Messamore/Extender: Kelly [name] in Treatment: 1 Wound Status Wound Number: 2 Primary Open Surgical Wound Etiology: Wound Location: Right, Anterior Lower Leg Wound Open Wounding Event: Surgical Injury Status: Date Acquired: 02/23/2022 Comorbid Sleep Apnea, Hypertension, Type II Diabetes, End Stage Renal Weeks Of Treatment: 1 History: Disease, Osteoarthritis, Neuropathy Clustered Wound: No Photos Wound Measurements Length: (cm) 2.2 Width: (cm) 0.3 Depth: (cm) 0.2 Area: (cm) 0.518 Volume: (cm) 0.104 % Reduction in Area: 45% % Reduction in Volume: 44.7% Epithelialization: None Tunneling: No Undermining: No Wound Description Classification: Full Thickness Without Exposed Support Structures Exudate Amount: Medium Exudate Type:  Serosanguineous Kelly [name], Kelly [name] ([id number]) Exudate  Color: red, brown Foul Odor After Cleansing: No Slough/Fibrino Yes [id number] Page 8 of 9 Wound Bed Granulation Amount: Medium (34-66%) Exposed Structure Granulation Quality: Pink Fascia Exposed: No Necrotic Amount: Medium (34-66%) Fat Layer (Subcutaneous Tissue) Exposed: Yes Necrotic Quality: Adherent Slough Tendon Exposed: No Muscle Exposed: No Joint Exposed: No Bone Exposed: No Treatment Notes Wound #2 (Lower Leg) Wound Laterality: Right, Anterior Cleanser Peri-Wound Care Topical Primary Dressing Honey: Activon Honey Gel, 25 ([name]) Tube Hydrofera Blue Ready Transfer Foam, 2.5x2.5 (in/in) Discharge Instruction: Apply Hydrofera Blue Ready to wound bed as directed Secondary Dressing (BORDER) Zetuvit Plus SILICONE BORDER Dressing 4x4 (in/in) Discharge Instruction: Please do not put silicone bordered dressings under wraps. Use non-bordered dressing only. Secured With Compression Wrap Compression Stockings Environmental education officer) Signed: 03/21/2022 10:26:37 AM By: Kelly Coria RN Entered By: Kelly [name] on 03/16/2022 13:33:33 -------------------------------------------------------------------------------- Vitals Details Patient Name: Date of Service: Kelly RD, Kelly [name] Kelly [name]. 03/16/2022 1:15 PM Medical Record Number: [id number] Patient Account Number: [id number] Date of Birth/Sex: Treating RN: 09-09-1940 (81 y.o. Kelly [name] Primary Care Janice Seales: Kelly [name] Other Clinician: Referring Slate Debroux: Treating Jaslynn Thome/Extender: Kelly [name] in Treatment: 1 Vital Signs Time Taken: 13:27 Temperature (F): 98.3 Height (in): 63 Pulse (bpm): 69 Weight (lbs): 120 Respiratory Rate (breaths/min): 16 Body Mass Index (BMI): 21.3 Blood Pressure (mmHg): 168/74 Reference Range: 80 - 120 mg / dl Electronic Signature(s) Signed: 03/21/2022 10:26:37 AM By: Kelly Coria RN Entered By: Kelly [name] on 03/16/2022  13:27:27 Kelly [name] ([id number]) [id number] Page 9 of 9

## 2022-03-22 ENCOUNTER — Telehealth: Payer: Self-pay

## 2022-03-22 NOTE — Telephone Encounter (Signed)
Lvm for pt to return call in regards to her lab request. See note

## 2022-03-22 NOTE — Telephone Encounter (Signed)
Patient states she would like to have her labs drawn before her next visit with Dr. Tommi Rumps on 04/08/2022.  Patient states she would like for her labs to include her A1c.  I let patient know that we do not have lab orders currently in the system, so I will send a note to Dr. Ellen Henri nurse.  Patient is requesting a call back.  **Patient states she needs her lab results back before 04/07/2022, so she can take the results of her A1c to her back doctor.

## 2022-03-22 NOTE — Telephone Encounter (Signed)
She only needs an A1c for her next visit so we can do that as a point-of-care test when she is in the office for the visit.

## 2022-03-23 ENCOUNTER — Encounter (HOSPITAL_BASED_OUTPATIENT_CLINIC_OR_DEPARTMENT_OTHER): Payer: Medicare Other | Admitting: Internal Medicine

## 2022-03-23 DIAGNOSIS — E11622 Type 2 diabetes mellitus with other skin ulcer: Secondary | ICD-10-CM | POA: Diagnosis not present

## 2022-03-23 DIAGNOSIS — L97818 Non-pressure chronic ulcer of other part of right lower leg with other specified severity: Secondary | ICD-10-CM | POA: Diagnosis not present

## 2022-03-23 DIAGNOSIS — I87311 Chronic venous hypertension (idiopathic) with ulcer of right lower extremity: Secondary | ICD-10-CM | POA: Diagnosis not present

## 2022-03-23 DIAGNOSIS — E1122 Type 2 diabetes mellitus with diabetic chronic kidney disease: Secondary | ICD-10-CM | POA: Diagnosis not present

## 2022-03-23 DIAGNOSIS — I120 Hypertensive chronic kidney disease with stage 5 chronic kidney disease or end stage renal disease: Secondary | ICD-10-CM | POA: Diagnosis not present

## 2022-03-23 DIAGNOSIS — T8131XD Disruption of external operation (surgical) wound, not elsewhere classified, subsequent encounter: Secondary | ICD-10-CM | POA: Diagnosis not present

## 2022-03-23 NOTE — Telephone Encounter (Signed)
I called and LVM for the patient to call back and schedule a nurse visit for a POC A1C.  Rina Adney,cma

## 2022-03-23 NOTE — Telephone Encounter (Signed)
Patient needs to have a A1c before 04/07/2022 to take to [facility] in [place]. Patient states that they requested it.

## 2022-03-24 NOTE — Telephone Encounter (Signed)
Patient called back and is schedule for a nurse visit for a POC A1C.  ng

## 2022-03-25 NOTE — Progress Notes (Signed)
Kelly [name], Kelly [name] ([id number]) [id number] Page 1 of 9 Visit Report for 03/23/2022 Arrival Information Details Patient Name: Date of Service: [address], [name] 03/23/2022 1:15 PM Medical Record Number: [id number] Patient Account Number: [id number] Date of Birth/Sex: Treating RN: 1940-06-01 (81 y.o. [name] Primary Care Nuh Lipton: Tommi Rumps Other Clinician: Referring Lewis Keats: Treating Duff Pozzi/Extender: Conni Slipper in Treatment: 2 Visit Information History Since Last Visit All ordered tests and consults were completed: No Patient Arrived: Ambulatory Added or deleted any medications: No Arrival Time: 13:37 Any new allergies or adverse reactions: No Accompanied By: self Had a fall or experienced change in No Transfer Assistance: None activities of daily living that may affect Patient Identification Verified: Yes risk of falls: Secondary Verification Process Completed: Yes Signs or symptoms of abuse/neglect since last visito No Patient Requires Transmission-Based Precautions: No Hospitalized since last visit: No Patient Has Alerts: Yes Implantable device outside of the clinic excluding No Patient Alerts: ABI left 1.15 10/28/21 cellular tissue based products placed in the center ABI right 1.13 10/28/21 since last visit: Has Dressing in Place as Prescribed: Yes Pain Present Now: No Electronic Signature(s) Signed: 03/25/2022 1:14:49 PM By: Carlene Coria RN Entered By: Carlene Coria on 03/23/2022 13:40:46 -------------------------------------------------------------------------------- Clinic Level of Care Assessment Details Patient Name: Date of Service: [facility] RD, [name] 03/23/2022 1:15 PM Medical Record Number: [id number] Patient Account Number: [id number] Date of Birth/Sex: Treating RN: [birth year] (81 y.o. [name] Primary Care Phillipa Morden: Tommi Rumps Other Clinician: Referring  Awad Gladd: Treating Perris Conwell/Extender: Conni Slipper in Treatment: 2 Clinic Level of Care Assessment Items TOOL 1 Quantity Score '[]'$  - 0 Use when EandM and Procedure is performed on INITIAL visit ASSESSMENTS - Nursing Assessment / Reassessment '[]'$  - 0 General Physical Exam (combine w/ comprehensive assessment (listed just below) when performed on new pt. evals) '[]'$  - 0 Comprehensive Assessment (HX, ROS, Risk Assessments, Wounds Hx, etc.) MURRAY, GUZZETTA G ([id number]) [id number] Page 2 of 9 ASSESSMENTS - Wound and Skin Assessment / Reassessment '[]'$  - 0 Dermatologic / Skin Assessment (not related to wound area) ASSESSMENTS - Ostomy and/or Continence Assessment and Care '[]'$  - 0 Incontinence Assessment and Management '[]'$  - 0 Ostomy Care Assessment and Management (repouching, etc.) PROCESS - Coordination of Care '[]'$  - 0 Simple Patient / Family Education for ongoing care '[]'$  - 0 Complex (extensive) Patient / Family Education for ongoing care '[]'$  - 0 Staff obtains Programmer, systems, Records, T Results / Process Orders est '[]'$  - 0 Staff telephones HHA, Nursing Homes / Clarify orders / etc '[]'$  - 0 Routine Transfer to another Facility (non-emergent condition) '[]'$  - 0 Routine Hospital Admission (non-emergent condition) '[]'$  - 0 New Admissions / Biomedical engineer / Ordering NPWT Apligraf, etc. , '[]'$  - 0 Emergency Hospital Admission (emergent condition) PROCESS - Special Needs '[]'$  - 0 Pediatric / Minor Patient Management '[]'$  - 0 Isolation Patient Management '[]'$  - 0 Hearing / Language / Visual special needs '[]'$  - 0 Assessment of Community assistance (transportation, D/C planning, etc.) '[]'$  - 0 Additional assistance / Altered mentation '[]'$  - 0 Support Surface(s) Assessment (bed, cushion, seat, etc.) INTERVENTIONS - Miscellaneous '[]'$  - 0 External ear exam '[]'$  - 0 Patient Transfer (multiple staff / Civil Service fast streamer / Similar devices) '[]'$  - 0 Simple  Staple / Suture removal (25 or less) '[]'$  - 0 Complex Staple / Suture removal (26 or more) '[]'$  - 0 Hypo/Hyperglycemic Management (do not check if billed separately) '[]'$  - 0 Ankle /  Brachial Index (ABI) - do not check if billed separately Has the patient been seen at the hospital within the last three years: Yes Total Score: 0 Level Of Care: ____ Electronic Signature(s) Signed: 03/25/2022 1:14:49 PM By: Carlene Coria RN Entered By: Carlene Coria on 03/23/2022 15:04:01 -------------------------------------------------------------------------------- Encounter Discharge Information Details Patient Name: Date of Service: [place]PP[place] RD, [place] RGA [name] 03/23/2022 1:15 PM Medical Record Number: [id number] Patient Account Number: [id number] Date of Birth/Sex: Treating RN: 09-19-1940 (81 y.o. [name] Primary Care Tiara Maultsby: Tommi Rumps Other Clinician: Referring Shaine Mount: Treating Keron Koffman/Extender: Nelda Bucks [place], Milinda Pointer ([id number]) [id number] Page 3 of 9 Weeks in Treatment: 2 Encounter Discharge Information Items Post Procedure Vitals Discharge Condition: Stable Temperature (F): 98 Ambulatory Status: Ambulatory Pulse (bpm): 78 Discharge Destination: Home Respiratory Rate (breaths/min): 18 Transportation: Private Auto Blood Pressure (mmHg): 147/69 Accompanied By: self Schedule Follow-up Appointment: Yes Clinical Summary of Care: Electronic Signature(s) Signed: 03/23/2022 3:05:50 PM By: Carlene Coria RN Entered By: Carlene Coria on 03/23/2022 15:05:50 -------------------------------------------------------------------------------- Lower Extremity Assessment Details Patient Name: Date of Service: SHEPP[place] RD, [place] RGA [name] 03/23/2022 1:15 PM Medical Record Number: [id number] Patient Account Number: [id number] Date of Birth/Sex: Treating RN: 1940-07-22 (81 y.o. [name] Primary Care Wynn Alldredge: Tommi Rumps Other  Clinician: Referring Raschelle Wisenbaker: Treating Jamisyn Langer/Extender: Conni Slipper in Treatment: 2 Edema Assessment Assessed: [Left: No] [Right: No] Edema: [Left: Ye] [Right: s] Calf Left: Right: Point of Measurement: 32 cm From Medial Instep 30 cm Ankle Left: Right: Point of Measurement: 10 cm From Medial Instep 20 cm Knee To Floor Left: Right: From Medial Instep 40 cm Vascular Assessment Pulses: Dorsalis Pedis Palpable: [Right:Yes] Electronic Signature(s) Signed: 03/25/2022 1:14:49 PM By: Carlene Coria RN Entered By: Carlene Coria on 03/23/2022 13:45:35 [name] ([id number]) [id number] Page 4 of 9 -------------------------------------------------------------------------------- Multi Wound Chart Details Patient Name: Date of Service: SHEPP[place] RD, [name] 03/23/2022 1:15 PM Medical Record Number: [id number] Patient Account Number: [id number] Date of Birth/Sex: Treating RN: 20-Feb-1941 (81 y.o. [name] Primary Care Calie Buttrey: Tommi Rumps Other Clinician: Referring Marquist Binstock: Treating Sue Fernicola/Extender: Conni Slipper in Treatment: 2 Vital Signs Height(in): 63 Pulse(bpm): 78 Weight(lbs): 120 Blood Pressure(mmHg): 147/69 Body Mass Index(BMI): 21.3 Temperature(F): 98 Respiratory Rate(breaths/min): 18 [2:Photos:] [N/A:N/A] Right, Anterior Lower Leg N/A N/A Wound Location: Surgical Injury N/A N/A Wounding Event: Open Surgical Wound N/A N/A Primary Etiology: Sleep Apnea, Hypertension, Type II N/A N/A Comorbid History: Diabetes, End Stage Renal Disease, Osteoarthritis, Neuropathy 02/23/2022 N/A N/A Date Acquired: 2 N/A N/A Weeks of Treatment: Open N/A N/A Wound Status: No N/A N/A Wound Recurrence: 2x0.3x0.2 N/A N/A Measurements L x W x D (cm) 0.471 N/A N/A A (cm) : rea 0.094 N/A N/A Volume (cm) : 50.00% N/A N/A % Reduction in Area: 50.00% N/A N/A %  Reduction in Volume: Full Thickness Without Exposed N/A N/A Classification: Support Structures Medium N/A N/A Exudate Amount: Serosanguineous N/A N/A Exudate Type: red, brown N/A N/A Exudate Color: Medium (34-66%) N/A N/A Granulation Amount: Pink N/A N/A Granulation Quality: Medium (34-66%) N/A N/A Necrotic Amount: Fat Layer (Subcutaneous Tissue): Yes N/A N/A Exposed Structures: Fascia: No Tendon: No Muscle: No Joint: No Bone: No None N/A N/A Epithelialization: Treatment Notes Electronic Signature(s) Signed: 03/25/2022 1:14:49 PM By: Carlene Coria RN Entered By: Carlene Coria on 03/23/2022 13:45:56 [name] ([id number]) [id number] Page 5 of 9 -------------------------------------------------------------------------------- Multi-Disciplinary Care Plan Details Patient Name: Date of Service: SHEPPA RD, [place] RGA [name] 03/23/2022 1:15  PM Medical Record Number: [id number] Patient Account Number: [id number] Date of Birth/Sex: Treating RN: December 31, 1940 (81 y.o. [name] Primary Care Elyanna Wallick: Tommi Rumps Other Clinician: Referring Maleeka Sabatino: Treating Aidel Davisson/Extender: Conni Slipper in Treatment: 2 Active Inactive Nutrition Nursing Diagnoses: Impaired glucose control: actual or potential Goals: Patient/caregiver will maintain therapeutic glucose control Date Initiated: 03/09/2022 Target Resolution Date: 04/08/2022 Goal Status: Active Interventions: Assess HgA1c results as ordered upon admission and as needed Assess patient nutrition upon admission and as needed per policy Notes: Wound/Skin Impairment Nursing Diagnoses: Knowledge deficit related to ulceration/compromised skin integrity Goals: Patient/caregiver will verbalize understanding of skin care regimen Date Initiated: 03/09/2022 Target Resolution Date: 04/08/2022 Goal Status: Active Ulcer/skin breakdown will have a volume reduction of 30%  by week 4 Date Initiated: 03/09/2022 Target Resolution Date: 05/09/2022 Goal Status: Active Ulcer/skin breakdown will have a volume reduction of 50% by week 8 Date Initiated: 03/09/2022 Target Resolution Date: 06/09/2022 Goal Status: Active Ulcer/skin breakdown will have a volume reduction of 80% by week 12 Date Initiated: 03/09/2022 Target Resolution Date: 07/08/2022 Goal Status: Active Ulcer/skin breakdown will heal within 14 weeks Date Initiated: 03/09/2022 Target Resolution Date: 08/09/2022 Goal Status: Active Interventions: Assess patient/caregiver ability to obtain necessary supplies Assess patient/caregiver ability to perform ulcer/skin care regimen upon admission and as needed Assess ulceration(s) every visit Notes: Electronic Signature(s) Signed: 03/25/2022 1:14:49 PM By: Carlene Coria RN Entered By: Carlene Coria on 03/23/2022 13:45:43 [name] ([id number]) [id number] Page 6 of 9 -------------------------------------------------------------------------------- Pain Assessment Details Patient Name: Date of Service: SHEPP[place] RD, [name] 03/23/2022 1:15 PM Medical Record Number: [id number] Patient Account Number: [id number] Date of Birth/Sex: Treating RN: [birth year] (81 y.o. [name] Primary Care Yazan Gatling: Tommi Rumps Other Clinician: Referring Katerina Zurn: Treating Lynnette Pote/Extender: Conni Slipper in Treatment: 2 Active Problems Location of Pain Severity and Description of Pain Patient Has Paino No Site Locations Pain Management and Medication Current Pain Management: Electronic Signature(s) Signed: 03/25/2022 1:14:49 PM By: Carlene Coria RN Entered By: Carlene Coria on 03/23/2022 13:41:08 -------------------------------------------------------------------------------- Patient/Caregiver Education Details Patient Name: Date of Service: Candise Bowens RD, [place] Ulla Gallo 11/15/2023andnbsp1:15 PM Medical  Record Number: [id number] Patient Account Number: [id number] Date of Birth/Gender: Treating RN: 18-Sep-1940 (81 y.o. [name] Primary Care Physician: Tommi Rumps Other Clinician: Referring Physician: Treating Physician/Extender: Conni Slipper in Treatment: [place], Milinda Pointer ([id number]) [id number] Page 7 of 9 Education Assessment Education Provided To: Patient Education Topics Provided Wound/Skin Impairment: Methods: Explain/Verbal Responses: State content correctly Electronic Signature(s) Signed: 03/25/2022 1:14:49 PM By: Carlene Coria RN Entered By: Carlene Coria on 03/23/2022 15:04:33 -------------------------------------------------------------------------------- Wound Assessment Details Patient Name: Date of Service: SHEPPA RD, [place] RGA [name] 03/23/2022 1:15 PM Medical Record Number: [id number] Patient Account Number: [id number] Date of Birth/Sex: Treating RN: Oct 24, 1940 (81 y.o. [name] Primary Care Eriberto Felch: Tommi Rumps Other Clinician: Referring Kenedie Dirocco: Treating Mahin Guardia/Extender: Conni Slipper in Treatment: 2 Wound Status Wound Number: 2 Primary Open Surgical Wound Etiology: Wound Location: Right, Anterior Lower Leg Wound Open Wounding Event: Surgical Injury Status: Date Acquired: 02/23/2022 Comorbid Sleep Apnea, Hypertension, Type II Diabetes, End Stage Renal Weeks Of Treatment: 2 History: Disease, Osteoarthritis, Neuropathy Clustered Wound: No Photos Wound Measurements Length: (cm) 2 Width: (cm) 0.3 Depth: (cm) 0.2 Area: (cm) 0.471 Volume: (cm) 0.094 % Reduction in Area: 50% % Reduction in Volume: 50% Epithelialization: None Tunneling: No Undermining: No Wound Description Classification: Full Thickness Without Exposed Support Structures Exudate Amount: Medium Exudate  Type: Serosanguineous Mallet, Latise G ([id number]) Exudate Color:  red, brown Foul Odor After Cleansing: No Slough/Fibrino Yes [id number] Page 8 of 9 Wound Bed Granulation Amount: Medium (34-66%) Exposed Structure Granulation Quality: Pink Fascia Exposed: No Necrotic Amount: Medium (34-66%) Fat Layer (Subcutaneous Tissue) Exposed: Yes Necrotic Quality: Adherent Slough Tendon Exposed: No Muscle Exposed: No Joint Exposed: No Bone Exposed: No Treatment Notes Wound #2 (Lower Leg) Wound Laterality: Right, Anterior Cleanser Wound Cleanser Discharge Instruction: Wash your hands with soap and water. Remove old dressing, discard into plastic bag and place into trash. Cleanse the wound with Wound Cleanser prior to applying a clean dressing using gauze sponges, not tissues or cotton balls. Do not scrub or use excessive force. Pat dry using gauze sponges, not tissue or cotton balls. Peri-Wound Care Topical Gentamicin Discharge Instruction: Apply as directed by Taino Maertens. Mupirocin Ointment Discharge Instruction: Apply as directed by Hipolito Martinezlopez. Primary Dressing Hydrofera Blue Ready Transfer Foam, 2.5x2.5 (in/in) Discharge Instruction: Apply Hydrofera Blue Ready to wound bed as directed Secondary Dressing ABD Pad 5x9 (in/in) Discharge Instruction: Cover with ABD pad Secured With Coban Cohesive Bandage 4x5 (yds) Stretched Discharge Instruction: Apply Coban as directed. Kerlix Roll Sterile or Non-Sterile 6-ply 4.5x4 (yd/yd) Discharge Instruction: Apply Kerlix as directed Compression Wrap Compression Stockings Add-Ons Electronic Signature(s) Signed: 03/25/2022 1:14:49 PM By: Carlene Coria RN Entered By: Carlene Coria on 03/23/2022 13:45:07 -------------------------------------------------------------------------------- Vitals Details Patient Name: Date of Service: SHEPPA RD, [place] RGA [name] 03/23/2022 1:15 PM Medical Record Number: [id number] Patient Account Number: [id number] Date of Birth/Sex: Treating RN: 01/10/1941 (81 y.o.  [name] Primary Care Takiya Belmares: Tommi Rumps Other Clinician: Referring Harris Kistler: Treating Johnnye Sandford/Extender: Conni Slipper in Treatment: [place], Milinda Pointer ([id number]) [id number] Page 9 of 9 Vital Signs Time Taken: 13:40 Temperature (F): 98 Height (in): 63 Pulse (bpm): 78 Weight (lbs): 120 Respiratory Rate (breaths/min): 18 Body Mass Index (BMI): 21.3 Blood Pressure (mmHg): 147/69 Reference Range: 80 - 120 mg / dl Electronic Signature(s) Signed: 03/25/2022 1:14:49 PM By: Carlene Coria RN Entered By: Carlene Coria on 03/23/2022 13:41:03

## 2022-03-25 NOTE — Progress Notes (Signed)
TAMEIKA, HECKMANN ([id number]) [id number] Page 1 of 7 Visit Report for 03/23/2022 Chief Complaint Document Details Patient Name: Date of Service: Kelly [name], Kelly [name] RGA [name] 03/23/2022 1:15 PM Medical Record Number: [id number] Patient Account Number: [id number] Date of Birth/Sex: Treating RN: 10-21-1940 (81 y.o. [name] Primary Care Provider: Tommi Rumps Other Clinician: Referring Provider: Treating Provider/Extender: Conni Slipper in Treatment: 2 Information Obtained from: Patient Chief Complaint Right LE Ulcer 03/09/2022; patient is here for review of a wound on the right anterior mid tibia which is a surgical wound Electronic Signature(s) Signed: 03/23/2022 4:43:59 PM By: Kalman Shan DO Entered By: Kalman Shan on 03/23/2022 14:27:45 -------------------------------------------------------------------------------- Debridement Details Patient Name: Date of Service: SHEPPA RD, MA RGA [name] 03/23/2022 1:15 PM Medical Record Number: [id number] Patient Account Number: [id number] Date of Birth/Sex: Treating RN: Nov 13, 1940 (81 y.o. [name] Primary Care Provider: Tommi Rumps Other Clinician: Referring Provider: Treating Provider/Extender: Conni Slipper in Treatment: 2 Debridement Performed for Assessment: Wound #2 Right,Anterior Lower Leg Performed By: Physician Kalman Shan, MD Debridement Type: Debridement Level of Consciousness (Pre-procedure): Awake and Alert Pre-procedure Verification/Time Out Yes - 13:55 Taken: Start Time: 13:55 T Area Debrided (L x W): otal 2 (cm) x 0.3 (cm) = 0.6 (cm) Tissue and other material debrided: Viable, Non-Viable, Slough, Subcutaneous, Slough Level: Skin/Subcutaneous Tissue Debridement Description: Excisional Instrument: Curette Bleeding: Minimum Hemostasis Achieved: Pressure End Time: 13:58 Procedural Pain: 0 Post  Procedural Pain: 0 Response to Treatment: Procedure was tolerated well JANAIA, KOZEL G ([id number]) [id number] Page 2 of 7 Level of Consciousness (Post- Awake and Alert procedure): Post Debridement Measurements of Total Wound Length: (cm) 2 Width: (cm) 0.3 Depth: (cm) 0.2 Volume: (cm) 0.094 Character of Wound/Ulcer Post Debridement: Improved Post Procedure Diagnosis Same as Pre-procedure Electronic Signature(s) Signed: 03/23/2022 4:43:59 PM By: Kalman Shan DO Signed: 03/25/2022 1:14:49 PM By: Carlene Coria RN Entered By: Carlene Coria on 03/23/2022 13:56:40 -------------------------------------------------------------------------------- HPI Details Patient Name: Date of Service: SHEPPA RD, MA RGA [name] 03/23/2022 1:15 PM Medical Record Number: [id number] Patient Account Number: [id number] Date of Birth/Sex: Treating RN: 01/22/1941 (81 y.o. [name] Primary Care Provider: Tommi Rumps Other Clinician: Referring Provider: Treating Provider/Extender: Conni Slipper in Treatment: 2 History of Present Illness HPI Description: 10-28-2021 upon evaluation today patient appears to be doing somewhat poorly in regard to a wound on her right leg. This happened when she had a concrete birdbath fall and hit her leg on May 16. She went to urgent care 2 days later since that time she has been on 2 rounds of doxycycline as well as a round of clindamycin the good news is I see no evidence of infection at this point. Fortunately there is no signs of active infection locally or systemically which is great news and overall I think you are on the right track as far as healing is concerned I do believe that there are some things we can do to help her out here. The patient does have a history of diabetes mellitus type 2, hypertension, chronic kidney disease stage IV, and chronic venous insufficiency/hypertension. Her most recent  hemoglobin A1c was 8.5 on 09-29-2021. 11-04-2021 upon evaluation today patient appears to be doing well currently in regard to her wound. She has been tolerating the dressing changes without complication. Fortunately I do not see any evidence of infection locally or systemically which is great news. 11-18-2021 upon evaluation today patient appears to be doing well currently in  regard to her wound. She has been tolerating the dressing changes without complication. Fortunately there does not appear to be any evidence of active infection locally or systemically at this time which is great news. No fevers, chills, nausea, vomiting, or diarrhea. READMISSION 03/09/2022 This is a 81 year old woman we actually had in the clinic for visits in June and July of this year. At this point she had a traumatic wound on the right anterior lower leg which ultimately healed. I did not see that she was actually discharged in a healed state from here that being said it did close over. Roughly 2 weeks ago she had a skin cancer removed by her dermatologist Dr. Evorn Gong from an area on the same leg just above the wound area that we were dealing with. It is sutured. She was seen in the urgent care a week ago because of pain and erythema and put on cephalexin for cellulitis although the patient tells me currently she is on doxycycline. She is using Vaseline gauze changing daily. She comes in today using the Vaseline gauze the area around the wound is somewhat macerated slight drainage from the center of this. She does not have a vascular issue her ABIs in the right leg were normal when she was here during the summer Patient is a type II diabetic, hypertension, chronic kidney disease stage IIIb 11/8; patient presents for follow-up. She states she had the sutures removed by her dermatologist yesterday. She is currently on Bactrim. She denies signs of infection today. She has been using silver alginate to the wound bed. 11/15;  patient presents for follow-up. She has been doing Medihoney with Hydrofera Blue to the wound bed daily. There has been improvement in wound healing. We discussed potentially doing an in office wrap versus compression stockings to help facilitate wound healing. She would like to proceed with an office wrap. Her ABIs on the right was 1.13. ARIYEL, JEANGILLES ([id number]) [id number] Page 3 of 7 Electronic Signature(s) Signed: 03/23/2022 4:43:59 PM By: Kalman Shan DO Entered By: Kalman Shan on 03/23/2022 14:28:52 -------------------------------------------------------------------------------- Physical Exam Details Patient Name: Date of Service: SHEPPA RD, MA RGA [name] 03/23/2022 1:15 PM Medical Record Number: [id number] Patient Account Number: [id number] Date of Birth/Sex: Treating RN: December 12, 1940 (81 y.o. [name] Primary Care Provider: Tommi Rumps Other Clinician: Referring Provider: Treating Provider/Extender: Conni Slipper in Treatment: 2 Constitutional . Cardiovascular . Psychiatric . Notes T the mid tibia there is a vertical wound along an incision site that has slightly dehisced. The surface Has granulation tissue and nonviable tissue. No probing o to bone. Rolled edges. No surrounding signs of infection including increased warmth, erythema or purulent drainage. Venous stasis dermatitis. No significant swelling noted on exam. Electronic Signature(s) Signed: 03/23/2022 4:43:59 PM By: Kalman Shan DO Entered By: Kalman Shan on 03/23/2022 14:38:23 -------------------------------------------------------------------------------- Physician Orders Details Patient Name: Date of Service: SHEPPA RD, MA RGA [name] 03/23/2022 1:15 PM Medical Record Number: [id number] Patient Account Number: [id number] Date of Birth/Sex: Treating RN: 22-Jun-1940 (81 y.o. [name] Primary Care Provider:  Tommi Rumps Other Clinician: Referring Provider: Treating Provider/Extender: Conni Slipper in Treatment: 2 Verbal / Phone Orders: No Diagnosis Coding Follow-up Appointments Return Appointment in 1 week. JAELLE, CAMPANILE ([id number]) [id number] Page 4 of 7 Bathing/ Shower/ Hygiene May shower; gently cleanse wound with antibacterial soap, rinse and pat dry prior to dressing wounds Edema Control - Lymphedema / Segmental Compressive Device / Other Elevate, Exercise Daily and  A void Standing for Long Periods of Time. Elevate legs to the level of the heart and pump ankles as often as possible Elevate leg(s) parallel to the floor when sitting. Wound Treatment Wound #2 - Lower Leg Wound Laterality: Right, Anterior Cleanser: Wound Cleanser 1 x Per Week/30 Days Discharge Instructions: Wash your hands with soap and water. Remove old dressing, discard into plastic bag and place into trash. Cleanse the wound with Wound Cleanser prior to applying a clean dressing using gauze sponges, not tissues or cotton balls. Do not scrub or use excessive force. Pat dry using gauze sponges, not tissue or cotton balls. Topical: Gentamicin 1 x Per Week/30 Days Discharge Instructions: Apply as directed by provider. Topical: Mupirocin Ointment 1 x Per Week/30 Days Discharge Instructions: Apply as directed by provider. Prim Dressing: Hydrofera Blue Ready Transfer Foam, 2.5x2.5 (in/in) (Generic) 1 x Per Week/30 Days ary Discharge Instructions: Apply Hydrofera Blue Ready to wound bed as directed Secondary Dressing: ABD Pad 5x9 (in/in) 1 x Per Week/30 Days Discharge Instructions: Cover with ABD pad Secured With: Coban Cohesive Bandage 4x5 (yds) Stretched 1 x Per Week/30 Days Discharge Instructions: Apply Coban as directed. Secured With: [employer] or Non-Sterile 6-ply 4.5x4 (yd/yd) 1 x Per Week/30 Days Discharge Instructions: Apply Kerlix as  directed Electronic Signature(s) Signed: 03/23/2022 4:43:59 PM By: Kalman Shan DO Entered By: Kalman Shan on 03/23/2022 14:41:15 -------------------------------------------------------------------------------- Problem List Details Patient Name: Date of Service: SHEPPA RD, MA RGA [name] 03/23/2022 1:15 PM Medical Record Number: [id number] Patient Account Number: [id number] Date of Birth/Sex: Treating RN: 08-13-1940 (81 y.o. [name] Primary Care Provider: Tommi Rumps Other Clinician: Referring Provider: Treating Provider/Extender: Conni Slipper in Treatment: 2 Active Problems ICD-10 Encounter Code Description Active Date MDM Diagnosis T81.31XD Disruption of external operation (surgical) wound, not elsewhere classified, 03/09/2022 No Yes subsequent encounter L97.818 Non-pressure chronic ulcer of other part of right lower leg with other specified 03/09/2022 No Yes severity DANNICA, BICKHAM ([id number]) [id number] Page 5 of 7 I87.2 Venous insufficiency (chronic) (peripheral) 03/23/2022 No Yes Inactive Problems Resolved Problems Electronic Signature(s) Signed: 03/23/2022 4:43:59 PM By: Kalman Shan DO Entered By: Kalman Shan on 03/23/2022 14:40:33 -------------------------------------------------------------------------------- Progress Note Details Patient Name: Date of Service: SHEPPA RD, MA RGA [name] 03/23/2022 1:15 PM Medical Record Number: [id number] Patient Account Number: [id number] Date of Birth/Sex: Treating RN: 1941/04/28 (81 y.o. [name] Primary Care Provider: Tommi Rumps Other Clinician: Referring Provider: Treating Provider/Extender: Conni Slipper in Treatment: 2 Subjective Chief Complaint Information obtained from Patient Right LE Ulcer 03/09/2022; patient is here for review of a wound on the right anterior mid tibia which is a surgical  wound History of Present Illness (HPI) 10-28-2021 upon evaluation today patient appears to be doing somewhat poorly in regard to a wound on her right leg. This happened when she had a concrete birdbath fall and hit her leg on May 16. She went to urgent care 2 days later since that time she has been on 2 rounds of doxycycline as well as a round of clindamycin the good news is I see no evidence of infection at this point. Fortunately there is no signs of active infection locally or systemically which is great news and overall I think you are on the right track as far as healing is concerned I do believe that there are some things we can do to help her out here. The patient does have a history of diabetes mellitus type 2,  hypertension, chronic kidney disease stage IV, and chronic venous insufficiency/hypertension. Her most recent hemoglobin A1c was 8.5 on 09-29-2021. 11-04-2021 upon evaluation today patient appears to be doing well currently in regard to her wound. She has been tolerating the dressing changes without complication. Fortunately I do not see any evidence of infection locally or systemically which is great news. 11-18-2021 upon evaluation today patient appears to be doing well currently in regard to her wound. She has been tolerating the dressing changes without complication. Fortunately there does not appear to be any evidence of active infection locally or systemically at this time which is great news. No fevers, chills, nausea, vomiting, or diarrhea. READMISSION 03/09/2022 This is a 81 year old woman we actually had in the clinic for visits in June and July of this year. At this point she had a traumatic wound on the right anterior lower leg which ultimately healed. I did not see that she was actually discharged in a healed state from here that being said it did close over. Roughly 2 weeks ago she had a skin cancer removed by her dermatologist Dr. Evorn Gong from an area on the same leg just  above the wound area that we were dealing with. It is sutured. She was seen in the urgent care a week ago because of pain and erythema and put on cephalexin for cellulitis although the patient tells me currently she is on doxycycline. She is using Vaseline gauze changing daily. She comes in today using the Vaseline gauze the area around the wound is somewhat macerated slight drainage from the center of this. She does not have a vascular issue her ABIs in the right leg were normal when she was here during the summer Patient is a type II diabetic, hypertension, chronic kidney disease stage IIIb 11/8; patient presents for follow-up. She states she had the sutures removed by her dermatologist yesterday. She is currently on Bactrim. She denies signs of infection today. She has been using silver alginate to the wound bed. 11/15; patient presents for follow-up. She has been doing Medihoney with Hydrofera Blue to the wound bed daily. There has been improvement in wound healing. We discussed potentially doing an in office wrap versus compression stockings to help facilitate wound healing. She would like to proceed with an office wrap. Her ABIs on the right was 1.13. LAURITA, PERON ([id number]) [id number] Page 6 of 7 Objective Constitutional Vitals Time Taken: 1:40 PM, Height: 63 in, Weight: 120 lbs, BMI: 21.3, Temperature: 98 F, Pulse: 78 bpm, Respiratory Rate: 18 breaths/min, Blood Pressure: 147/69 mmHg. General Notes: T the mid tibia there is a vertical wound along an incision site that has slightly dehisced. The surface Has granulation tissue and nonviable o tissue. No probing to bone. Rolled edges. No surrounding signs of infection including increased warmth, erythema or purulent drainage. Venous stasis dermatitis. No significant swelling noted on exam. Integumentary (Hair, Skin) Wound #2 status is Open. Original cause of wound was Surgical Injury. The date  acquired was: 02/23/2022. The wound has been in treatment 2 weeks. The wound is located on the Right,Anterior Lower Leg. The wound measures 2cm length x 0.3cm width x 0.2cm depth; 0.471cm^2 area and 0.094cm^3 volume. There is Fat Layer (Subcutaneous Tissue) exposed. There is no tunneling or undermining noted. There is a medium amount of serosanguineous drainage noted. There is medium (34-66%) pink granulation within the wound bed. There is a medium (34-66%) amount of necrotic tissue within the wound bed including Adherent Slough. Assessment Active Problems ICD-10  Disruption of external operation (surgical) wound, not elsewhere classified, subsequent encounter Non-pressure chronic ulcer of other part of right lower leg with other specified severity Venous insufficiency (chronic) (peripheral) Patient's wound has improved in size and appearance since last clinic visit. I debrided nonviable tissue. She does have evidence of venous insufficiency on exam and would benefit from compression therapy. She has compression stockings at home. We discussed in office wraps vs compression stocking and she would like to proceed with our in office wrap. She knows to not get this wet or keep this on for more than 7 days. I recommended antibiotic ointment to address any bioburden and also continue with Hydrofera Blue under Kerlix/Coban. Her ABIs are within normal range and he she has strong pedal pulses. She should have adequate blood flow for healing. Follow-up in 1 week. Procedures Wound #2 Pre-procedure diagnosis of Wound #2 is an Open Surgical Wound located on the Right,Anterior Lower Leg . There was a Excisional Skin/Subcutaneous Tissue Debridement with a total area of 0.6 sq cm performed by Kalman Shan, MD. With the following instrument(s): Curette to remove Viable and Non-Viable tissue/material. Material removed includes Subcutaneous Tissue and Slough and. No specimens were taken. A time out was  conducted at 13:55, prior to the start of the procedure. A Minimum amount of bleeding was controlled with Pressure. The procedure was tolerated well with a pain level of 0 throughout and a pain level of 0 following the procedure. Post Debridement Measurements: 2cm length x 0.3cm width x 0.2cm depth; 0.094cm^3 volume. Character of Wound/Ulcer Post Debridement is improved. Post procedure Diagnosis Wound #2: Same as Pre-Procedure Plan Follow-up Appointments: Return Appointment in 1 week. Bathing/ Shower/ Hygiene: May shower; gently cleanse wound with antibacterial soap, rinse and pat dry prior to dressing wounds Edema Control - Lymphedema / Segmental Compressive Device / Other: Elevate, Exercise Daily and Avoid Standing for Long Periods of Time. Elevate legs to the level of the heart and pump ankles as often as possible Elevate leg(s) parallel to the floor when sitting. WOUND #2: - Lower Leg Wound Laterality: Right, Anterior Cleanser: Wound Cleanser 1 x Per Week/30 Days Discharge Instructions: Wash your hands with soap and water. Remove old dressing, discard into plastic bag and place into trash. Cleanse the wound with Wound Cleanser prior to applying a clean dressing using gauze sponges, not tissues or cotton balls. Do not scrub or use excessive force. Pat dry using gauze sponges, not tissue or cotton balls. Topical: Gentamicin 1 x Per Week/30 Days Discharge Instructions: Apply as directed by provider. Topical: Mupirocin Ointment 1 x Per Week/30 Days Discharge Instructions: Apply as directed by provider. Prim Dressing: Hydrofera Blue Ready Transfer Foam, 2.5x2.5 (in/in) (Generic) 1 x Per Week/30 Days ary Discharge Instructions: Apply Hydrofera Blue Ready to wound bed as directed Secondary Dressing: ABD Pad 5x9 (in/in) 1 x Per Week/30 Days Discharge Instructions: Cover with ABD pad DEEANN, SERVIDIO ([id number]) [id number] Page 7 of 7 Secured With: Coban  Cohesive Bandage 4x5 (yds) Stretched 1 x Per Week/30 Days Discharge Instructions: Apply Coban as directed. Secured With: [employer] or Non-Sterile 6-ply 4.5x4 (yd/yd) 1 x Per Week/30 Days Discharge Instructions: Apply Kerlix as directed 1. In office sharp debridement 2. Antibiotic ointment with Hydrofera Blue under Kerlix/Coban 3. Follow-up in 1 week Electronic Signature(s) Signed: 03/23/2022 4:43:59 PM By: Kalman Shan DO Entered By: Kalman Shan on 03/23/2022 14:40:44 -------------------------------------------------------------------------------- SuperBill Details Patient Name: Date of Service: SHEPPA [name], [place]. 03/23/2022 Medical Record Number: [id number]  Patient Account Number: [id number] Date of Birth/Sex: Treating RN: 04/25/1941 (81 y.o. [name] Primary Care Provider: Tommi Rumps Other Clinician: Referring Provider: Treating Provider/Extender: Conni Slipper in Treatment: 2 Diagnosis Coding ICD-10 Codes Code Description T81.31XD Disruption of external operation (surgical) wound, not elsewhere classified, subsequent encounter L97.818 Non-pressure chronic ulcer of other part of right lower leg with other specified severity I87.2 Venous insufficiency (chronic) (peripheral) Facility Procedures : CPT4 Code: [id number] Description: 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

## 2022-03-30 ENCOUNTER — Encounter (HOSPITAL_BASED_OUTPATIENT_CLINIC_OR_DEPARTMENT_OTHER): Payer: Medicare Other | Admitting: Internal Medicine

## 2022-03-30 DIAGNOSIS — L97818 Non-pressure chronic ulcer of other part of right lower leg with other specified severity: Secondary | ICD-10-CM

## 2022-03-30 DIAGNOSIS — I872 Venous insufficiency (chronic) (peripheral): Secondary | ICD-10-CM

## 2022-03-30 DIAGNOSIS — E11622 Type 2 diabetes mellitus with other skin ulcer: Secondary | ICD-10-CM | POA: Diagnosis not present

## 2022-03-30 DIAGNOSIS — T8131XD Disruption of external operation (surgical) wound, not elsewhere classified, subsequent encounter: Secondary | ICD-10-CM

## 2022-03-30 DIAGNOSIS — I120 Hypertensive chronic kidney disease with stage 5 chronic kidney disease or end stage renal disease: Secondary | ICD-10-CM | POA: Diagnosis not present

## 2022-03-30 DIAGNOSIS — E1122 Type 2 diabetes mellitus with diabetic chronic kidney disease: Secondary | ICD-10-CM | POA: Diagnosis not present

## 2022-03-30 DIAGNOSIS — I87311 Chronic venous hypertension (idiopathic) with ulcer of right lower extremity: Secondary | ICD-10-CM | POA: Diagnosis not present

## 2022-03-31 NOTE — Progress Notes (Signed)
Kelly [name], Kelly [name] ([id number]) [id number] Page 1 of 7 Visit Report for 03/30/2022 Chief Complaint Document Details Patient Name: Date of Service: [facility] RD, [place] RGA RETTE [name]. 03/30/2022 11:30 A M Medical Record Number: [id number] Patient Account Number: [id number] Date of Birth/Sex: Treating RN: [birth year] (81 y.o. Drema Pry Primary Care Provider: Tommi Rumps Other Clinician: Referring Provider: Treating Provider/Extender: Conni Slipper in Treatment: 3 Information Obtained from: Patient Chief Complaint Right LE Ulcer 03/09/2022; patient is here for review of a wound on the right anterior mid tibia which is a surgical wound Electronic Signature(s) Signed: 03/30/2022 12:14:43 PM By: Kalman Shan DO Entered By: Kalman Shan on 03/30/2022 12:10:00 -------------------------------------------------------------------------------- Debridement Details Patient Name: Date of Service: [facility] RD, [place] [name]. 03/30/2022 11:30 A M Medical Record Number: [id number] Patient Account Number: [id number] Date of Birth/Sex: Treating RN: [birth year] (81 y.o. Drema Pry Primary Care Provider: Tommi Rumps Other Clinician: Referring Provider: Treating Provider/Extender: Conni Slipper in Treatment: 3 Debridement Performed for Assessment: Wound #2 Right,Anterior Lower Leg Performed By: Physician Kalman Shan, MD Debridement Type: Debridement Level of Consciousness (Pre-procedure): Awake and Alert Pre-procedure Verification/Time Out Yes - 12:08 Taken: Start Time: 12:08 T Area Debrided (L x W): otal 1.3 (cm) x 0.3 (cm) = 0.39 (cm) Tissue and other material debrided: Slough, Subcutaneous, Slough Level: Skin/Subcutaneous Tissue Debridement Description: Excisional Instrument: Curette Bleeding: Minimum Hemostasis Achieved: Pressure Response to Treatment: Procedure was tolerated well Level  of Consciousness (Post- Awake and Alert procedure): Kelly [name], Kelly [name] ([id number]) [id number] Page 2 of 7 Post Debridement Measurements of Total Wound Length: (cm) 1.3 Width: (cm) 0.3 Depth: (cm) 0.3 Volume: (cm) 0.092 Character of Wound/Ulcer Post Debridement: Stable Post Procedure Diagnosis Same as Pre-procedure Electronic Signature(s) Signed: 03/30/2022 12:14:43 PM By: Kalman Shan DO Signed: 03/30/2022 4:52:27 PM By: Rosalio Loud MSN RN CNS WTA Entered By: Rosalio Loud on 03/30/2022 12:09:22 -------------------------------------------------------------------------------- HPI Details Patient Name: Date of Service: SHEPPA RD, [place]. 03/30/2022 11:30 A M Medical Record Number: [id number] Patient Account Number: [id number] Date of Birth/Sex: Treating RN: Oct 10, 1940 (81 y.o. Drema Pry Primary Care Provider: Tommi Rumps Other Clinician: Referring Provider: Treating Provider/Extender: Conni Slipper in Treatment: 3 History of Present Illness HPI Description: 10-28-2021 upon evaluation today patient appears to be doing somewhat poorly in regard to a wound on her right leg. This happened when she had a concrete birdbath fall and hit her leg on May 16. She went to urgent care 2 days later since that time she has been on 2 rounds of doxycycline as well as a round of clindamycin the good news is I see no evidence of infection at this point. Fortunately there is no signs of active infection locally or systemically which is great news and overall I think you are on the right track as far as healing is concerned I do believe that there are some things we can do to help her out here. The patient does have a history of diabetes mellitus type 2, hypertension, chronic kidney disease stage IV, and chronic venous insufficiency/hypertension. Her most recent hemoglobin A1c was 8.5 on 09-29-2021. 11-04-2021 upon evaluation  today patient appears to be doing well currently in regard to her wound. She has been tolerating the dressing changes without complication. Fortunately I do not see any evidence of infection locally or systemically which is great news. 11-18-2021 upon evaluation today patient appears to be doing well currently in regard to her wound. She has  been tolerating the dressing changes without complication. Fortunately there does not appear to be any evidence of active infection locally or systemically at this time which is great news. No fevers, chills, nausea, vomiting, or diarrhea. READMISSION 03/09/2022 This is a 81 year old woman we actually had in the clinic for visits in June and July of this year. At this point she had a traumatic wound on the right anterior lower leg which ultimately healed. I did not see that she was actually discharged in a healed state from here that being said it did close over. Roughly 2 weeks ago she had a skin cancer removed by her dermatologist Dr. Evorn Gong from an area on the same leg just above the wound area that we were dealing with. It is sutured. She was seen in the urgent care a week ago because of pain and erythema and put on cephalexin for cellulitis although the patient tells me currently she is on doxycycline. She is using Vaseline gauze changing daily. She comes in today using the Vaseline gauze the area around the wound is somewhat macerated slight drainage from the center of this. She does not have a vascular issue her ABIs in the right leg were normal when she was here during the summer Patient is a type II diabetic, hypertension, chronic kidney disease stage IIIb 11/8; patient presents for follow-up. She states she had the sutures removed by her dermatologist yesterday. She is currently on Bactrim. She denies signs of infection today. She has been using silver alginate to the wound bed. 11/15; patient presents for follow-up. She has been doing Medihoney with  Hydrofera Blue to the wound bed daily. There has been improvement in wound healing. We discussed potentially doing an in office wrap versus compression stockings to help facilitate wound healing. She would like to proceed with an office wrap. Her ABIs on the right was 1.13. 11/22; patient presents for follow-up. At last clinic visit antibiotic ointment with Hydrofera Blue under Kerlix/Coban was used. The next day the patient got the wrap wet and took it off. She has been doing Medihoney and Hydrofera Blue to the wound bed daily. She has no issues or complaints today. Kelly [name], Kelly [name] ([id number]) [id number] Page 3 of 7 Electronic Signature(s) Signed: 03/30/2022 12:14:43 PM By: Kalman Shan DO Entered By: Kalman Shan on 03/30/2022 12:10:38 -------------------------------------------------------------------------------- Physical Exam Details Patient Name: Date of Service: SHEPPA RD, [place] [name]. 03/30/2022 11:30 A M Medical Record Number: [id number] Patient Account Number: [id number] Date of Birth/Sex: Treating RN: [birth year] (81 y.o. Drema Pry Primary Care Provider: Tommi Rumps Other Clinician: Referring Provider: Treating Provider/Extender: Conni Slipper in Treatment: 3 Constitutional . Cardiovascular . Psychiatric . Notes T the mid tibia there is a vertical wound along an incision site that has dehisced. The surface Has granulation tissue and nonviable tissue. No probing to bone. o Rolled edges. No surrounding signs of infection including increased warmth, erythema or purulent drainage. Venous stasis dermatitis. No significant swelling noted on exam. Electronic Signature(s) Signed: 03/30/2022 12:14:43 PM By: Kalman Shan DO Entered By: Kalman Shan on 03/30/2022 12:11:31 -------------------------------------------------------------------------------- Physician Orders Details Patient Name:  Date of Service: SHEPPA RD, [place] [name]. 03/30/2022 11:30 A M Medical Record Number: [id number] Patient Account Number: [id number] Date of Birth/Sex: Treating RN: [birth year] (81 y.o. Drema Pry Primary Care Provider: Tommi Rumps Other Clinician: Referring Provider: Treating Provider/Extender: Conni Slipper in Treatment: 3 Verbal / Phone Orders: No Diagnosis Coding Follow-up Appointments Return  Appointment in 1 week. Kelly [name], Kelly [name] ([id number]) [id number] Page 4 of 7 Bathing/ Shower/ Hygiene May shower; gently cleanse wound with antibacterial soap, rinse and pat dry prior to dressing wounds Edema Control - Lymphedema / Segmental Compressive Device / Other Tubigrip double layer applied Elevate, Exercise Daily and A void Standing for Long Periods of Time. Elevate legs to the level of the heart and pump ankles as often as possible Elevate leg(s) parallel to the floor when sitting. Wound Treatment Wound #2 - Lower Leg Wound Laterality: Right, Anterior Cleanser: Wound Cleanser Every Other Day/30 Days Discharge Instructions: Wash your hands with soap and water. Remove old dressing, discard into plastic bag and place into trash. Cleanse the wound with Wound Cleanser prior to applying a clean dressing using gauze sponges, not tissues or cotton balls. Do not scrub or use excessive force. Pat dry using gauze sponges, not tissue or cotton balls. Topical: Activon Honey Gel, 25 ([name]) Tube Every Other Day/30 Days Prim Dressing: Hydrofera Blue Ready Transfer Foam, 2.5x2.5 (in/in) (DME) (Generic) Every Other Day/30 Days ary Discharge Instructions: Apply Hydrofera Blue Ready to wound bed as directed Secondary Dressing: (BORDER) Zetuvit Plus SILICONE BORDER Dressing 4x4 (in/in) (DME) (Generic) Every Other Day/30 Days Discharge Instructions: Please do not put silicone bordered dressings under wraps. Use non-bordered dressing  only. Secured With: Tubigrip Size D, 3x10 (in/yd) Every Other Day/30 Days Electronic Signature(s) Signed: 03/30/2022 12:14:43 PM By: Kalman Shan DO Entered By: Kalman Shan on 03/30/2022 12:14:23 -------------------------------------------------------------------------------- Problem List Details Patient Name: Date of Service: SHEPPA RD, [place] [name]. 03/30/2022 11:30 A M Medical Record Number: [id number] Patient Account Number: [id number] Date of Birth/Sex: Treating RN: 11/29/1940 (81 y.o. Drema Pry Primary Care Provider: Tommi Rumps Other Clinician: Referring Provider: Treating Provider/Extender: Conni Slipper in Treatment: 3 Active Problems ICD-10 Encounter Code Description Active Date MDM Diagnosis T81.31XD Disruption of external operation (surgical) wound, not elsewhere classified, 03/09/2022 No Yes subsequent encounter L97.818 Non-pressure chronic ulcer of other part of right lower leg with other specified 03/09/2022 No Yes severity I87.2 Venous insufficiency (chronic) (peripheral) 03/23/2022 No Yes Inactive Problems Kelly [name], Kelly [name] ([id number]) [id number] Page 5 of 7 Resolved Problems Electronic Signature(s) Signed: 03/30/2022 12:14:43 PM By: Kalman Shan DO Entered By: Kalman Shan on 03/30/2022 12:09:56 -------------------------------------------------------------------------------- Progress Note Details Patient Name: Date of Service: SHEPPA RD, [place] [name]. 03/30/2022 11:30 A M Medical Record Number: [id number] Patient Account Number: [id number] Date of Birth/Sex: Treating RN: 07/06/1940 (81 y.o. Drema Pry Primary Care Provider: Tommi Rumps Other Clinician: Referring Provider: Treating Provider/Extender: Conni Slipper in Treatment: 3 Subjective Chief Complaint Information obtained from Patient Right LE Ulcer 03/09/2022; patient is here  for review of a wound on the right anterior mid tibia which is a surgical wound History of Present Illness (HPI) 10-28-2021 upon evaluation today patient appears to be doing somewhat poorly in regard to a wound on her right leg. This happened when she had a concrete birdbath fall and hit her leg on May 16. She went to urgent care 2 days later since that time she has been on 2 rounds of doxycycline as well as a round of clindamycin the good news is I see no evidence of infection at this point. Fortunately there is no signs of active infection locally or systemically which is great news and overall I think you are on the right track as far as healing is concerned I do believe that there are some things we  can do to help her out here. The patient does have a history of diabetes mellitus type 2, hypertension, chronic kidney disease stage IV, and chronic venous insufficiency/hypertension. Her most recent hemoglobin A1c was 8.5 on 09-29-2021. 11-04-2021 upon evaluation today patient appears to be doing well currently in regard to her wound. She has been tolerating the dressing changes without complication. Fortunately I do not see any evidence of infection locally or systemically which is great news. 11-18-2021 upon evaluation today patient appears to be doing well currently in regard to her wound. She has been tolerating the dressing changes without complication. Fortunately there does not appear to be any evidence of active infection locally or systemically at this time which is great news. No fevers, chills, nausea, vomiting, or diarrhea. READMISSION 03/09/2022 This is a 81 year old woman we actually had in the clinic for visits in June and July of this year. At this point she had a traumatic wound on the right anterior lower leg which ultimately healed. I did not see that she was actually discharged in a healed state from here that being said it did close over. Roughly 2 weeks ago she had a skin cancer  removed by her dermatologist Dr. Evorn Gong from an area on the same leg just above the wound area that we were dealing with. It is sutured. She was seen in the urgent care a week ago because of pain and erythema and put on cephalexin for cellulitis although the patient tells me currently she is on doxycycline. She is using Vaseline gauze changing daily. She comes in today using the Vaseline gauze the area around the wound is somewhat macerated slight drainage from the center of this. She does not have a vascular issue her ABIs in the right leg were normal when she was here during the summer Patient is a type II diabetic, hypertension, chronic kidney disease stage IIIb 11/8; patient presents for follow-up. She states she had the sutures removed by her dermatologist yesterday. She is currently on Bactrim. She denies signs of infection today. She has been using silver alginate to the wound bed. 11/15; patient presents for follow-up. She has been doing Medihoney with Hydrofera Blue to the wound bed daily. There has been improvement in wound healing. We discussed potentially doing an in office wrap versus compression stockings to help facilitate wound healing. She would like to proceed with an office wrap. Her ABIs on the right was 1.13. 11/22; patient presents for follow-up. At last clinic visit antibiotic ointment with Hydrofera Blue under Kerlix/Coban was used. The next day the patient got the wrap wet and took it off. She has been doing Medihoney and Hydrofera Blue to the wound bed daily. She has no issues or complaints today. Kelly [name], Kelly [name] ([id number]) [id number] Page 6 of 7 Objective Constitutional Vitals Time Taken: 11:56 AM, Height: 63 in, Weight: 120 lbs, BMI: 21.3, Temperature: 98.0 F, Pulse: 68 bpm, Respiratory Rate: 16 breaths/min, Blood Pressure: 175/78 mmHg. General Notes: T the mid tibia there is a vertical wound along an incision site that has  dehisced. The surface Has granulation tissue and nonviable tissue. No o probing to bone. Rolled edges. No surrounding signs of infection including increased warmth, erythema or purulent drainage. Venous stasis dermatitis. No significant swelling noted on exam. Integumentary (Hair, Skin) Wound #2 status is Open. Original cause of wound was Surgical Injury. The date acquired was: 02/23/2022. The wound has been in treatment 3 weeks. The wound is located on the Right,Anterior Lower Leg. The  wound measures 1.3cm length x 0.3cm width x 0.2cm depth; 0.306cm^2 area and 0.061cm^3 volume. There is Fat Layer (Subcutaneous Tissue) exposed. There is a medium amount of serosanguineous drainage noted. There is medium (34-66%) pink granulation within the wound bed. There is a medium (34-66%) amount of necrotic tissue within the wound bed including Adherent Slough. Assessment Active Problems ICD-10 Disruption of external operation (surgical) wound, not elsewhere classified, subsequent encounter Non-pressure chronic ulcer of other part of right lower leg with other specified severity Venous insufficiency (chronic) (peripheral) Patient's wound appears well-healing. Unfortunately the compression wrap got wet and she needed to take this off. At this time I recommended continuing with Medihoney and Hydrofera Blue and using compression stockings. We will give her Tubigrip in office. She knows not to use both together. Follow-up in 1 week. Procedures Wound #2 Pre-procedure diagnosis of Wound #2 is an Open Surgical Wound located on the Right,Anterior Lower Leg . There was a Excisional Skin/Subcutaneous Tissue Debridement with a total area of 0.39 sq cm performed by Kalman Shan, MD. With the following instrument(s): Curette Material removed includes Subcutaneous Tissue and Slough and. No specimens were taken. A time out was conducted at 12:08, prior to the start of the procedure. A Minimum amount of bleeding was  controlled with Pressure. The procedure was tolerated well. Post Debridement Measurements: 1.3cm length x 0.3cm width x 0.3cm depth; 0.092cm^3 volume. Character of Wound/Ulcer Post Debridement is stable. Post procedure Diagnosis Wound #2: Same as Pre-Procedure Plan 1. In office sharp debridement 2. Medihoney and Hydrofera Blue 3. Tubigrip or compression stockings daily 4. Follow-up in 1 week Electronic Signature(s) Signed: 03/30/2022 12:14:43 PM By: Kalman Shan DO Entered By: Kalman Shan on 03/30/2022 12:14:03 Kelly [name] ([id number]) [id number] Page 7 of 7 -------------------------------------------------------------------------------- SuperBill Details Patient Name: Date of Service: [facility] RD, [place]. 03/30/2022 Medical Record Number: [id number] Patient Account Number: [id number] Date of Birth/Sex: Treating RN: [birth year] (81 y.o. 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 I87.2 Venous insufficiency (chronic) (peripheral) T81.31XD Disruption of external  operation (surgical) wound, not elsewhere classified, subs Modifier: severity equent encounter Quantity: 1 Physician Procedures : CPT4 Code Description Modifier [id number] - WC PHYS SUBQ TISS 20 SQ CM ICD-10 Diagnosis Description L97.818  Non-pressure chronic ulcer of other part of right lower leg with other specified severity I87.2 Venous insufficiency (chronic) (peripheral)  T81.31XD Disruption of external operation (surgical) wound, not elsewhere classified, subsequent encounter Quantity: 1 Electronic Signature(s) Signed: 03/30/2022 12:14:43 PM By: Kalman Shan DO Entered By: Kalman Shan on 03/30/2022 12:14:15

## 2022-03-31 NOTE — Progress Notes (Signed)
Kelly [name] ([id number]) [id number] Page 1 of 8 Visit Report for 03/30/2022 Arrival Information Details Patient Name: Date of Service: [place] [address], [place] Kelly [name] 03/30/2022 11:30 A M Medical Record Number: [id number] Patient Account Number: [id number] Date of Birth/Sex: Treating RN: 04-Jun-1940 (81 y.o. Kelly [name] Primary Care Mak Bonny: Kelly [name] Other Clinician: Referring Kelly [name]: Treating Kelly [name]/Extender: Kelly [name] in Treatment: 3 Visit Information History Since Last Visit Added or deleted any medications: No Patient Arrived: Ambulatory Any new allergies or adverse reactions: No Arrival Time: 11:50 Had a fall or experienced change in No Accompanied By: daughter activities of daily living that may affect Transfer Assistance: None risk of falls: Patient Requires Transmission-Based Precautions: No Hospitalized since last visit: No Patient Has Alerts: Yes Pain Present Now: No Patient Alerts: ABI left 1.15 10/28/21 ABI right 1.13 10/28/21 Electronic Signature(s) Signed: 03/30/2022 4:52:27 PM By: Kelly Loud MSN RN CNS WTA Entered By: Kelly [name] on 03/30/2022 11:56:41 -------------------------------------------------------------------------------- Clinic Level of Care Assessment Details Patient Name: Date of Service: Kelly [name] RD, [place] Kelly [name] 03/30/2022 11:30 A M Medical Record Number: [id number] Patient Account Number: [id number] Date of Birth/Sex: Treating RN: [birth year] (81 y.o. Kelly [name] Primary Care Kelly [name]: Kelly [name] Other Clinician: Referring Kelly [name]: Treating Kelly [name]/Extender: Kelly [name] in Treatment: 3 Clinic Level of Care Assessment Items TOOL 1 Quantity Score '[]'$  - 0 Use when EandM and Procedure is performed on INITIAL visit ASSESSMENTS - Nursing Assessment / Reassessment '[]'$  - 0 General Physical Exam (combine w/ comprehensive assessment  (listed just below) when performed on new pt. evals) '[]'$  - 0 Comprehensive Assessment (HX, ROS, Risk Assessments, Wounds Hx, etc.) ASSESSMENTS - Wound and Skin Assessment / Reassessment '[]'$  - 0 Dermatologic / Skin Assessment (not related to wound area) ASSESSMENTS - Ostomy and/or Continence Assessment and Care '[]'$  - 0 Incontinence Assessment and Management Kelly, [name] ([id number]) [id number] Page 2 of 8 '[]'$  - 0 Ostomy Care Assessment and Management (repouching, etc.) PROCESS - Coordination of Care '[]'$  - 0 Simple Patient / Family Education for ongoing care '[]'$  - 0 Complex (extensive) Patient / Family Education for ongoing care '[]'$  - 0 Staff obtains Programmer, systems, Records, T Results / Process Orders est '[]'$  - 0 Staff telephones HHA, Nursing Homes / Clarify orders / etc '[]'$  - 0 Routine Transfer to another Facility (non-emergent condition) '[]'$  - 0 Routine Hospital Admission (non-emergent condition) '[]'$  - 0 New Admissions / Biomedical engineer / Ordering NPWT Apligraf, etc. , '[]'$  - 0 Emergency Hospital Admission (emergent condition) PROCESS - Special Needs '[]'$  - 0 Pediatric / Minor Patient Management '[]'$  - 0 Isolation Patient Management '[]'$  - 0 Hearing / Language / Visual special needs '[]'$  - 0 Assessment of Community assistance (transportation, D/C planning, etc.) '[]'$  - 0 Additional assistance / Altered mentation '[]'$  - 0 Support Surface(s) Assessment (bed, cushion, seat, etc.) INTERVENTIONS - Miscellaneous '[]'$  - 0 External ear exam '[]'$  - 0 Patient Transfer (multiple staff / Civil Service fast streamer / Similar devices) '[]'$  - 0 Simple Staple / Suture removal (25 or less) '[]'$  - 0 Complex Staple / Suture removal (26 or more) '[]'$  - 0 Hypo/Hyperglycemic Management (do not check if billed separately) '[]'$  - 0 Ankle / Brachial Index (ABI) - do not check if billed separately Has the patient been seen at the hospital within the last three years: Yes Total Score: 0 Level Of  Care: ____ Electronic Signature(s) Signed: 03/30/2022 4:52:27 PM By: Kelly Loud MSN RN CNS WTA Entered By: Tamala Julian,  Jocelyn Lamer on 03/30/2022 12:12:29 -------------------------------------------------------------------------------- Encounter Discharge Information Details Patient Name: Date of Service: [facility] RD, [place]. 03/30/2022 11:30 A M Medical Record Number: [id number] Patient Account Number: [id number] Date of Birth/Sex: Treating RN: Feb 02, 1941 (81 y.o. Kelly [name] Primary Care Drianna Chandran: Kelly [name] Other Clinician: Referring Agusta Hackenberg: Treating Shalie Schremp/Extender: Kelly [name] in Treatment: 3 Encounter Discharge Information Items Post Procedure Vitals Discharge Condition: Stable Temperature (F): 98.0 Ambulatory Status: Ambulatory Pulse (bpm): 68 [name], [name] ([id number]) [id number] Page 3 of 8 Discharge Destination: Home Respiratory Rate (breaths/min): 16 Transportation: Ambulance Blood Pressure (mmHg): 175/78 Accompanied By: daughter Schedule Follow-up Appointment: No Clinical Summary of Care: Electronic Signature(s) Signed: 03/30/2022 4:52:27 PM By: Kelly Loud MSN RN CNS WTA Entered By: Kelly [name] on 03/30/2022 12:14:07 -------------------------------------------------------------------------------- Lower Extremity Assessment Details Patient Name: Date of Service: [facility] RD, [place]. 03/30/2022 11:30 A M Medical Record Number: [id number] Patient Account Number: [id number] Date of Birth/Sex: Treating RN: [birth year] (81 y.o. Kelly [name] Primary Care Coralynn Gaona: Kelly [name] Other Clinician: Referring Stacie Templin: Treating Laverle Pillard/Extender: Kelly [name] in Treatment: 3 Edema Assessment Assessed: [Left: No] [Right: No] [Left: Edema] [Right: :] Calf Left: Right: Point of Measurement: 32 cm From Medial Instep 30 cm Ankle Left: Right: Point of  Measurement: 10 cm From Medial Instep 20 cm Vascular Assessment Pulses: Dorsalis Pedis Palpable: [Right:Yes] Electronic Signature(s) Signed: 03/30/2022 4:52:27 PM By: Kelly Loud MSN RN CNS WTA Entered By: Kelly [name] on 03/30/2022 12:03:40 -------------------------------------------------------------------------------- Multi Wound Chart Details Patient Name: Date of Service: [address]ise [address] RD, [place] Kelly [name] 03/30/2022 11:30 A Kennieth Rad ([id number]) [id number] Page 4 of 8 Medical Record Number: [id number] Patient Account Number: [id number] Date of Birth/Sex: Treating RN: [birth year] (81 y.o. Kelly [name] Primary Care Khristine Verno: Kelly [name] Other Clinician: Referring Shiann Kam: Treating Kenzley Ke/Extender: Kelly [name] in Treatment: 3 Vital Signs Height(in): 63 Pulse(bpm): 51 Weight(lbs): 120 Blood Pressure(mmHg): 175/78 Body Mass Index(BMI): 21.3 Temperature(F): 98.0 Respiratory Rate(breaths/min): 16 [2:Photos:] [N/A:N/A] Right, Anterior Lower Leg N/A N/A Wound Location: Surgical Injury N/A N/A Wounding Event: Open Surgical Wound N/A N/A Primary Etiology: Sleep Apnea, Hypertension, Type II N/A N/A Comorbid History: Diabetes, End Stage Renal Disease, Osteoarthritis, Neuropathy 02/23/2022 N/A N/A Date Acquired: 3 N/A N/A Weeks of Treatment: Open N/A N/A Wound Status: No N/A N/A Wound Recurrence: 1.3x0.3x0.2 N/A N/A Measurements L x W x D (cm) 0.306 N/A N/A A (cm) : rea 0.061 N/A N/A Volume (cm) : 67.50% N/A N/A % Reduction in Area: 67.60% N/A N/A % Reduction in Volume: Full Thickness Without Exposed N/A N/A Classification: Support Structures Medium N/A N/A Exudate Amount: Serosanguineous N/A N/A Exudate Type: red, brown N/A N/A Exudate Color: Medium (34-66%) N/A N/A Granulation Amount: Pink N/A N/A Granulation Quality: Medium (34-66%) N/A N/A Necrotic Amount: Fat Layer  (Subcutaneous Tissue): Yes N/A N/A Exposed Structures: Fascia: No Tendon: No Muscle: No Joint: No Bone: No None N/A N/A Epithelialization: Treatment Notes Electronic Signature(s) Signed: 03/30/2022 4:52:27 PM By: Kelly Loud MSN RN CNS WTA Entered By: Kelly [name] on 03/30/2022 12:07:44 [place] Details -------------------------------------------------------------------------------- Kelly [name] ([id number]) [id number] Page 5 of 8 Patient Name: Date of Service: [facility] RD, [place] Kelly [name] 03/30/2022 11:30 A M Medical Record Number: [id number] Patient Account Number: [id number] Date of Birth/Sex: Treating RN: 09-14-1940 (81 y.o. Kelly [name] Primary Care Jasten Guyette: Kelly [name] Other Clinician: Referring Eavan Gonterman: Treating Jahmia Berrett/Extender: Kelly [name] in Treatment: 3 Active Inactive Nutrition Nursing  Diagnoses: Impaired glucose control: actual or potential Goals: Patient/caregiver will maintain therapeutic glucose control Date Initiated: 03/09/2022 Target Resolution Date: 04/08/2022 Goal Status: Active Interventions: Assess HgA1c results as ordered upon admission and as needed Assess patient nutrition upon admission and as needed per policy Notes: Wound/Skin Impairment Nursing Diagnoses: Knowledge deficit related to ulceration/compromised skin integrity Goals: Patient/caregiver will verbalize understanding of skin care regimen Date Initiated: 03/09/2022 Target Resolution Date: 04/08/2022 Goal Status: Active Ulcer/skin breakdown will have a volume reduction of 30% by week 4 Date Initiated: 03/09/2022 Target Resolution Date: 05/09/2022 Goal Status: Active Ulcer/skin breakdown will have a volume reduction of 50% by week 8 Date Initiated: 03/09/2022 Target Resolution Date: 06/09/2022 Goal Status: Active Ulcer/skin breakdown will have a volume reduction of 80% by week 12 Date Initiated:  03/09/2022 Target Resolution Date: 07/08/2022 Goal Status: Active Ulcer/skin breakdown will heal within 14 weeks Date Initiated: 03/09/2022 Target Resolution Date: 08/09/2022 Goal Status: Active Interventions: Assess patient/caregiver ability to obtain necessary supplies Assess patient/caregiver ability to perform ulcer/skin care regimen upon admission and as needed Assess ulceration(s) every visit Notes: Electronic Signature(s) Signed: 03/30/2022 4:52:27 PM By: Kelly Loud MSN RN CNS WTA Entered By: Kelly [name] on 03/30/2022 12:03:49 Pain Assessment Details -------------------------------------------------------------------------------- Kelly [name] ([id number]) [id number] Page 6 of 8 Patient Name: Date of Service: [facility] RD, [place] Kelly [name] 03/30/2022 11:30 A M Medical Record Number: [id number] Patient Account Number: [id number] Date of Birth/Sex: Treating RN: 1941/05/04 (81 y.o. Kelly [name] Primary Care Akshay Spang: Kelly [name] Other Clinician: Referring Daniella Dewberry: Treating Dezeray Puccio/Extender: Kelly [name] in Treatment: 3 Active Problems Location of Pain Severity and Description of Pain Patient Has Paino No Site Locations Pain Management and Medication Current Pain Management: Electronic Signature(s) Signed: 03/30/2022 4:52:27 PM By: Kelly Loud MSN RN CNS WTA Entered By: Kelly [name] on 03/30/2022 11:58:14 -------------------------------------------------------------------------------- Patient/Caregiver Education Details Patient Name: Date of Service: [address]ise [address] RD, [place] Kelly RETTE Darnell Level 11/22/2023andnbsp11:30 A M Medical Record Number: [id number] Patient Account Number: [id number] Date of Birth/Gender: Treating RN: 07-23-1940 (81 y.o. Kelly [name] Primary Care Physician: Kelly [name] Other Clinician: Referring Physician: Treating Physician/Extender: Kelly [name] in Treatment:  3 Education Assessment Education Provided To: Patient Education Topics Provided Wound Debridement: Handouts: Wound Debridement Methods: Explain/Verbal Responses: State content correctly Kelly, [name] ([id number]) [id number] Page 7 of 8 Electronic Signature(s) Signed: 03/30/2022 4:52:27 PM By: Kelly Loud MSN RN CNS WTA Entered By: Kelly [name] on 03/30/2022 12:12:53 -------------------------------------------------------------------------------- Wound Assessment Details Patient Name: Date of Service: [place] [address], [place] Kelly [name] 03/30/2022 11:30 A M Medical Record Number: [id number] Patient Account Number: [id number] Date of Birth/Sex: Treating RN: 07/09/1940 (81 y.o. Kelly [name] Primary Care Shelley Pooley: Kelly [name] Other Clinician: Referring Colt Martelle: Treating Minaal Struckman/Extender: Kelly [name] in Treatment: 3 Wound Status Wound Number: 2 Primary Open Surgical Wound Etiology: Wound Location: Right, Anterior Lower Leg Wound Open Wounding Event: Surgical Injury Status: Date Acquired: 02/23/2022 Comorbid Sleep Apnea, Hypertension, Type II Diabetes, End Stage Renal Weeks Of Treatment: 3 History: Disease, Osteoarthritis, Neuropathy Clustered Wound: No Photos Wound Measurements Length: (cm) 1.3 Width: (cm) 0.3 Depth: (cm) 0.2 Area: (cm) 0.306 Volume: (cm) 0.061 % Reduction in Area: 67.5% % Reduction in Volume: 67.6% Epithelialization: None Wound Description Classification: Full Thickness Without Exposed Support Exudate Amount: Medium Exudate Type: Serosanguineous Exudate Color: red, brown Structures Foul Odor After Cleansing: No Slough/Fibrino Yes Wound Bed Granulation Amount: Medium (34-66%) Exposed Structure Granulation Quality: Pink Fascia Exposed: No Necrotic Amount: Medium (34-66%) Fat  Layer (Subcutaneous Tissue) Exposed: Yes Necrotic Quality: Adherent Slough Tendon Exposed: No Muscle  Exposed: No Joint Exposed: No Bone Exposed: No Treatment Notes Kelly, [name] ([id number]) [id number] Page 8 of 8 Wound #2 (Lower Leg) Wound Laterality: Right, Anterior Cleanser Wound Cleanser Discharge Instruction: Wash your hands with soap and water. Remove old dressing, discard into plastic bag and place into trash. Cleanse the wound with Wound Cleanser prior to applying a clean dressing using gauze sponges, not tissues or cotton balls. Do not scrub or use excessive force. Pat dry using gauze sponges, not tissue or cotton balls. Peri-Wound Care Topical Activon Honey Gel, 25 (g) Tube Primary Dressing Hydrofera Blue Ready Transfer Foam, 2.5x2.5 (in/in) Discharge Instruction: Apply Hydrofera Blue Ready to wound bed as directed Secondary Dressing (BORDER) Zetuvit Plus SILICONE BORDER Dressing 4x4 (in/in) Discharge Instruction: Please do not put silicone bordered dressings under wraps. Use non-bordered dressing only. Secured With Tubigrip Size D, 3x10 (in/yd) Compression Wrap Compression Stockings Add-Ons Electronic Signature(s) Signed: 03/30/2022 4:52:27 PM By: Kelly Loud MSN RN CNS WTA Entered By: Kelly [name] on 03/30/2022 12:02:42 -------------------------------------------------------------------------------- Vitals Details Patient Name: Date of Service: [place] [address], [place] Kelly [name] 03/30/2022 11:30 A M Medical Record Number: [id number] Patient Account Number: [id number] Date of Birth/Sex: Treating RN: [birth year] (81 y.o. Kelly [name] Primary Care Mashell Sieben: Kelly [name] Other Clinician: Referring Trenae Brunke: Treating Larya Charpentier/Extender: Kelly [name] in Treatment: 3 Vital Signs Time Taken: 11:56 Temperature (F): 98.0 Height (in): 63 Pulse (bpm): 68 Weight (lbs): 120 Respiratory Rate (breaths/min): 16 Body Mass Index (BMI): 21.3 Blood Pressure (mmHg): 175/78 Reference Range: 80 - 120 mg / dl Electronic  Signature(s) Signed: 03/30/2022 4:52:27 PM By: Kelly Loud MSN RN CNS WTA Entered By: Kelly [name] on 03/30/2022 11:58:07

## 2022-04-04 ENCOUNTER — Ambulatory Visit (INDEPENDENT_AMBULATORY_CARE_PROVIDER_SITE_OTHER): Payer: Medicare Other

## 2022-04-04 DIAGNOSIS — Z794 Long term (current) use of insulin: Secondary | ICD-10-CM | POA: Diagnosis not present

## 2022-04-04 DIAGNOSIS — E1122 Type 2 diabetes mellitus with diabetic chronic kidney disease: Secondary | ICD-10-CM | POA: Diagnosis not present

## 2022-04-04 DIAGNOSIS — N184 Chronic kidney disease, stage 4 (severe): Secondary | ICD-10-CM | POA: Diagnosis not present

## 2022-04-04 LAB — POCT GLYCOSYLATED HEMOGLOBIN (HGB A1C)
HbA1c POC (<> result, manual entry): 8.9 % (ref 4.0–5.6)
HbA1c, POC (controlled diabetic range): 8.9 % — AB (ref 0.0–7.0)
HbA1c, POC (prediabetic range): 8.9 % — AB (ref 5.7–6.4)
Hemoglobin A1C: 8.9 % — AB (ref 4.0–5.6)

## 2022-04-04 NOTE — Progress Notes (Signed)
Pt presented for a Point of Care A1C. Pt was identified through two identifiers. Her A1C was 8.9%.

## 2022-04-05 ENCOUNTER — Telehealth: Payer: Self-pay

## 2022-04-05 NOTE — Telephone Encounter (Signed)
Patient states she would like to know if her A1c results are back.  If so, patient states she would like to come by and pick up a printed copy.  Patient requested to speak with someone to see if her results are in right now.  I spoke with Jeralyn Bennett, CMA, and transferred call to her.

## 2022-04-05 NOTE — Telephone Encounter (Signed)
Noted  

## 2022-04-05 NOTE — Telephone Encounter (Signed)
Patient came by and pick up Lab results

## 2022-04-06 ENCOUNTER — Encounter (HOSPITAL_BASED_OUTPATIENT_CLINIC_OR_DEPARTMENT_OTHER): Payer: Medicare Other | Admitting: Internal Medicine

## 2022-04-06 DIAGNOSIS — E1122 Type 2 diabetes mellitus with diabetic chronic kidney disease: Secondary | ICD-10-CM | POA: Diagnosis not present

## 2022-04-06 DIAGNOSIS — I87311 Chronic venous hypertension (idiopathic) with ulcer of right lower extremity: Secondary | ICD-10-CM

## 2022-04-06 DIAGNOSIS — E11622 Type 2 diabetes mellitus with other skin ulcer: Secondary | ICD-10-CM | POA: Diagnosis not present

## 2022-04-06 DIAGNOSIS — I120 Hypertensive chronic kidney disease with stage 5 chronic kidney disease or end stage renal disease: Secondary | ICD-10-CM | POA: Diagnosis not present

## 2022-04-06 DIAGNOSIS — L97818 Non-pressure chronic ulcer of other part of right lower leg with other specified severity: Secondary | ICD-10-CM | POA: Diagnosis not present

## 2022-04-06 DIAGNOSIS — T8131XD Disruption of external operation (surgical) wound, not elsewhere classified, subsequent encounter: Secondary | ICD-10-CM | POA: Diagnosis not present

## 2022-04-06 NOTE — Progress Notes (Signed)
JECENIA, LEAMER ([id number]) [id number] Page 1 of 9 Visit Report for 04/06/2022 Arrival Information Details Patient Name: Date of Service: [address], [name] 04/06/2022 11:30 A M Medical Record Number: [id number] Patient Account Number: [id number] Date of Birth/Sex: Treating RN: 19-Jun-1940 (81 y.o. Drema Pry Primary Care Shriya Aker: Tommi Rumps Other Clinician: Referring Anntoinette Haefele: Treating Analisse Randle/Extender: Conni Slipper in Treatment: 4 Visit Information History Since Last Visit Added or deleted any medications: No Patient Arrived: Ambulatory Any new allergies or adverse reactions: No Arrival Time: 12:10 Had a fall or experienced change in No Accompanied By: daughter activities of daily living that may affect Transfer Assistance: None risk of falls: Patient Identification Verified: Yes Hospitalized since last visit: No Secondary Verification Process Completed: Yes Pain Present Now: No Patient Requires Transmission-Based Precautions: No Patient Has Alerts: Yes Patient Alerts: ABI left 1.15 10/28/21 ABI right 1.13 10/28/21 Electronic Signature(s) Signed: 04/06/2022 4:43:57 PM By: Rosalio Loud MSN RN CNS WTA Previous Signature: 04/06/2022 2:05:50 PM Version By: Rosalio Loud MSN RN CNS WTA Entered By: Rosalio Loud on 04/06/2022 16:43:57 -------------------------------------------------------------------------------- Clinic Level of Care Assessment Details Patient Name: Date of Service: [facility] RD, [place] RGA [name] 04/06/2022 11:30 A M Medical Record Number: [id number] Patient Account Number: [id number] Date of Birth/Sex: Treating RN: [birth year] (81 y.o. Drema Pry Primary Care Nymir Ringler: Tommi Rumps Other Clinician: Referring Kasee Hantz: Treating Margarethe Virgen/Extender: Conni Slipper in Treatment: 4 Clinic Level of Care Assessment Items TOOL 1 Quantity Score '[]'$  - 0 Use when  EandM and Procedure is performed on INITIAL visit ASSESSMENTS - Nursing Assessment / Reassessment '[]'$  - 0 General Physical Exam (combine w/ comprehensive assessment (listed just below) when performed on new pt. evals) '[]'$  - 0 Comprehensive Assessment (HX, ROS, Risk Assessments, Wounds Hx, etc.) ASSESSMENTS - Wound and Skin Assessment / Reassessment '[]'$  - 0 Dermatologic / Skin Assessment (not related to wound area) Ramanathan, Lorrinda G ([id number]) [id number] Page 2 of 9 ASSESSMENTS - Ostomy and/or Continence Assessment and Care '[]'$  - 0 Incontinence Assessment and Management '[]'$  - 0 Ostomy Care Assessment and Management (repouching, etc.) PROCESS - Coordination of Care '[]'$  - 0 Simple Patient / Family Education for ongoing care '[]'$  - 0 Complex (extensive) Patient / Family Education for ongoing care '[]'$  - 0 Staff obtains Programmer, systems, Records, T Results / Process Orders est '[]'$  - 0 Staff telephones HHA, Nursing Homes / Clarify orders / etc '[]'$  - 0 Routine Transfer to another Facility (non-emergent condition) '[]'$  - 0 Routine Hospital Admission (non-emergent condition) '[]'$  - 0 New Admissions / Biomedical engineer / Ordering NPWT Apligraf, etc. , '[]'$  - 0 Emergency Hospital Admission (emergent condition) PROCESS - Special Needs '[]'$  - 0 Pediatric / Minor Patient Management '[]'$  - 0 Isolation Patient Management '[]'$  - 0 Hearing / Language / Visual special needs '[]'$  - 0 Assessment of Community assistance (transportation, D/C planning, etc.) '[]'$  - 0 Additional assistance / Altered mentation '[]'$  - 0 Support Surface(s) Assessment (bed, cushion, seat, etc.) INTERVENTIONS - Miscellaneous '[]'$  - 0 External ear exam '[]'$  - 0 Patient Transfer (multiple staff / Civil Service fast streamer / Similar devices) '[]'$  - 0 Simple Staple / Suture removal (25 or less) '[]'$  - 0 Complex Staple / Suture removal (26 or more) '[]'$  - 0 Hypo/Hyperglycemic Management (do not check if billed separately) '[]'$  -  0 Ankle / Brachial Index (ABI) - do not check if billed separately Has the patient been seen at the hospital within the last three years: Yes Total  Score: 0 Level Of Care: ____ Electronic Signature(s) Signed: 04/06/2022 4:57:00 PM By: Rosalio Loud MSN RN CNS WTA Entered By: Rosalio Loud on 04/06/2022 16:51:06 -------------------------------------------------------------------------------- Encounter Discharge Information Details Patient Name: Date of Service: [address], [place] RGA [name] 04/06/2022 11:30 A M Medical Record Number: [id number] Patient Account Number: [id number] Date of Birth/Sex: Treating RN: 1941-03-13 (81 y.o. Drema Pry Primary Care Ellwyn Ergle: Tommi Rumps Other Clinician: Referring Bernardina Cacho: Treating Shebra Muldrow/Extender: Conni Slipper in Treatment: 4 Encounter Discharge Information Items Post Procedure Vitals KALESHA, IRVING ([id number]) [id number] Page 3 of 9 Discharge Condition: Stable Temperature (F): 97.9 Ambulatory Status: Ambulatory Pulse (bpm): 62 Discharge Destination: Home Respiratory Rate (breaths/min): 16 Transportation: Private Auto Blood Pressure (mmHg): 157/60 Accompanied By: daughter Schedule Follow-up Appointment: Yes Clinical Summary of Care: Electronic Signature(s) Signed: 04/06/2022 4:53:02 PM By: Rosalio Loud MSN RN CNS WTA Entered By: Rosalio Loud on 04/06/2022 16:53:02 -------------------------------------------------------------------------------- Lower Extremity Assessment Details Patient Name: Date of Service: [place] RD, [place] RGA [name] 04/06/2022 11:30 A M Medical Record Number: [id number] Patient Account Number: [id number] Date of Birth/Sex: Treating RN: [birth year] (81 y.o. Drema Pry Primary Care Chantelle Verdi: Tommi Rumps Other Clinician: Referring Justiss Gerbino: Treating Ira Busbin/Extender: Conni Slipper in Treatment: 4 Edema  Assessment Assessed: Shirlyn Goltz: No] Patrice Paradise: No] [Left: Edema] [Right: :] Calf Left: Right: Point of Measurement: 32 cm From Medial Instep 30 cm Ankle Left: Right: Point of Measurement: 10 cm From Medial Instep 19.5 cm Vascular Assessment Pulses: Dorsalis Pedis Palpable: [Right:Yes] Electronic Signature(s) Signed: 04/06/2022 4:44:26 PM By: Rosalio Loud MSN RN CNS WTA Entered By: Rosalio Loud on 04/06/2022 16:44:26 Luff, Milinda Pointer ([id number]) [id number] Page 4 of 9 -------------------------------------------------------------------------------- Multi Wound Chart Details Patient Name: Date of Service: [facility] RD, [name] 04/06/2022 11:30 A M Medical Record Number: [id number] Patient Account Number: [id number] Date of Birth/Sex: Treating RN: March 13, 1941 (81 y.o. Drema Pry Primary Care Gurnie Duris: Tommi Rumps Other Clinician: Referring Willaim Mode: Treating Thadius Smisek/Extender: Conni Slipper in Treatment: 4 Vital Signs Height(in): 63 Pulse(bpm): 91 Weight(lbs): 120 Blood Pressure(mmHg): 157/60 Body Mass Index(BMI): 21.3 Temperature(F): 97.9 Respiratory Rate(breaths/min): 18 [2:Photos:] [N/A:N/A] Right, Anterior Lower Leg N/A N/A Wound Location: Surgical Injury N/A N/A Wounding Event: Open Surgical Wound N/A N/A Primary Etiology: Sleep Apnea, Hypertension, Type II N/A N/A Comorbid History: Diabetes, End Stage Renal Disease, Osteoarthritis, Neuropathy 02/23/2022 N/A N/A Date Acquired: 4 N/A N/A Weeks of Treatment: Open N/A N/A Wound Status: No N/A N/A Wound Recurrence: 1.2x0.3x0.2 N/A N/A Measurements L x W x D (cm) 0.283 N/A N/A A (cm) : rea 0.057 N/A N/A Volume (cm) : 70.00% N/A N/A % Reduction in Area: 69.70% N/A N/A % Reduction in Volume: Full Thickness Without Exposed N/A N/A Classification: Support Structures Medium N/A N/A Exudate Amount: Serosanguineous N/A N/A Exudate  Type: red, brown N/A N/A Exudate Color: Medium (34-66%) N/A N/A Granulation Amount: Pink N/A N/A Granulation Quality: Medium (34-66%) N/A N/A Necrotic Amount: Fat Layer (Subcutaneous Tissue): Yes N/A N/A Exposed Structures: Fascia: No Tendon: No Muscle: No Joint: No Bone: No None N/A N/A Epithelialization: Treatment Notes Electronic Signature(s) Signed: 04/06/2022 4:44:41 PM By: Rosalio Loud MSN RN CNS WTA Entered By: Rosalio Loud on 04/06/2022 16:44:40 Yip, Milinda Pointer ([id number]) [id number] Page 5 of 9 -------------------------------------------------------------------------------- Multi-Disciplinary Care Plan Details Patient Name: Date of Service: [facility] RD, [name] 04/06/2022 11:30 A M Medical Record Number: [id number] Patient Account Number: [id number] Date of Birth/Sex: Treating RN: 1940-07-14 (81 y.o. Drema Pry  Primary Care Blaize Epple: Tommi Rumps Other Clinician: Referring Lynasia Meloche: Treating Rakwon Letourneau/Extender: Conni Slipper in Treatment: 4 Active Inactive Nutrition Nursing Diagnoses: Impaired glucose control: actual or potential Goals: Patient/caregiver will maintain therapeutic glucose control Date Initiated: 03/09/2022 Target Resolution Date: 04/08/2022 Goal Status: Active Interventions: Assess HgA1c results as ordered upon admission and as needed Assess patient nutrition upon admission and as needed per policy Notes: Wound/Skin Impairment Nursing Diagnoses: Knowledge deficit related to ulceration/compromised skin integrity Goals: Patient/caregiver will verbalize understanding of skin care regimen Date Initiated: 03/09/2022 Target Resolution Date: 04/08/2022 Goal Status: Active Ulcer/skin breakdown will have a volume reduction of 30% by week 4 Date Initiated: 03/09/2022 Target Resolution Date: 05/09/2022 Goal Status: Active Ulcer/skin breakdown will have a volume reduction of 50%  by week 8 Date Initiated: 03/09/2022 Target Resolution Date: 06/09/2022 Goal Status: Active Ulcer/skin breakdown will have a volume reduction of 80% by week 12 Date Initiated: 03/09/2022 Target Resolution Date: 07/08/2022 Goal Status: Active Ulcer/skin breakdown will heal within 14 weeks Date Initiated: 03/09/2022 Target Resolution Date: 08/09/2022 Goal Status: Active Interventions: Assess patient/caregiver ability to obtain necessary supplies Assess patient/caregiver ability to perform ulcer/skin care regimen upon admission and as needed Assess ulceration(s) every visit Notes: Electronic Signature(s) Signed: 04/06/2022 4:44:31 PM By: Rosalio Loud MSN RN CNS WTA Entered By: Rosalio Loud on 04/06/2022 16:44:30 [name] ([id number]) [id number] Page 6 of 9 -------------------------------------------------------------------------------- Pain Assessment Details Patient Name: Date of Service: [address] RD, [name] 04/06/2022 11:30 A M Medical Record Number: [id number] Patient Account Number: [id number] Date of Birth/Sex: Treating RN: Dec 23, 1940 (81 y.o. Drema Pry Primary Care Binh Doten: Tommi Rumps Other Clinician: Referring Sterling Ucci: Treating Aishia Barkey/Extender: Conni Slipper in Treatment: 4 Active Problems Location of Pain Severity and Description of Pain Patient Has Paino No Site Locations Pain Management and Medication Current Pain Management: Electronic Signature(s) Signed: 04/06/2022 4:44:17 PM By: Rosalio Loud MSN RN CNS WTA Previous Signature: 04/06/2022 2:05:58 PM Version By: Rosalio Loud MSN RN CNS WTA Entered By: Rosalio Loud on 04/06/2022 16:44:16 -------------------------------------------------------------------------------- Patient/Caregiver Education Details Patient Name: Date of Service: [facility] RD, [place] RGA RETTE Darnell Level 11/29/2023andnbsp11:30 A M Medical Record Number: [id number] Patient Account  Number: [id number] Date of Birth/Gender: Treating RN: 16-Jul-1940 (81 y.o. Drema Pry Primary Care Physician: Tommi Rumps Other Clinician: Referring Physician: Treating Physician/Extender: Conni Slipper in Treatment: [place], Milinda Pointer ([id number]) [id number] Page 7 of 9 Education Assessment Education Provided To: Patient Education Topics Provided Wound Debridement: Handouts: Wound Debridement Methods: Explain/Verbal Responses: State content correctly Electronic Signature(s) Signed: 04/06/2022 4:57:00 PM By: Rosalio Loud MSN RN CNS WTA Entered By: Rosalio Loud on 04/06/2022 16:51:32 -------------------------------------------------------------------------------- Wound Assessment Details Patient Name: Date of Service: [place] RD, [place] RGA [name] 04/06/2022 11:30 A M Medical Record Number: [id number] Patient Account Number: [id number] Date of Birth/Sex: Treating RN: [birth year] (81 y.o. Drema Pry Primary Care Davie Sagona: Tommi Rumps Other Clinician: Referring Iyani Dresner: Treating Sheril Hammond/Extender: Conni Slipper in Treatment: 4 Wound Status Wound Number: 2 Primary Open Surgical Wound Etiology: Wound Location: Right, Anterior Lower Leg Wound Open Wounding Event: Surgical Injury Status: Date Acquired: 02/23/2022 Comorbid Sleep Apnea, Hypertension, Type II Diabetes, End Stage Renal Weeks Of Treatment: 4 History: Disease, Osteoarthritis, Neuropathy Clustered Wound: No Photos Wound Measurements Length: (cm) 1.2 Width: (cm) 0.3 Depth: (cm) 0.2 Area: (cm) 0.283 Volume: (cm) 0.057 % Reduction in Area: 70% % Reduction in Volume: 69.7% Epithelialization: None Wound Description Classification: Full Thickness Without Exposed Support Exudate  Amount: Medium Exudate Type: Serosanguineous Gellis, Railee G ([id number]) Exudate Color: red, brown Structures Foul Odor After  Cleansing: No Slough/Fibrino Yes [id number] Page 8 of 9 Wound Bed Granulation Amount: Medium (34-66%) Exposed Structure Granulation Quality: Pink Fascia Exposed: No Necrotic Amount: Medium (34-66%) Fat Layer (Subcutaneous Tissue) Exposed: Yes Necrotic Quality: Adherent Slough Tendon Exposed: No Muscle Exposed: No Joint Exposed: No Bone Exposed: No Treatment Notes Wound #2 (Lower Leg) Wound Laterality: Right, Anterior Cleanser Soap and Water Discharge Instruction: Gently cleanse wound with antibacterial soap, rinse and pat dry prior to dressing wounds Peri-Wound Care Topical Activon Honey Gel, 25 (g) Tube Primary Dressing Hydrofera Blue Ready Transfer Foam, 2.5x2.5 (in/in) Discharge Instruction: Apply Hydrofera Blue Ready to wound bed as directed Secondary Dressing (BORDER) Zetuvit Plus SILICONE BORDER Dressing 4x4 (in/in) Discharge Instruction: Please do not put silicone bordered dressings under wraps. Use non-bordered dressing only. Secured With Tubigrip Size D, 3x10 (in/yd) Compression Wrap Compression Stockings Add-Ons Electronic Signature(s) Signed: 04/06/2022 4:57:00 PM By: Rosalio Loud MSN RN CNS WTA Entered By: Rosalio Loud on 04/06/2022 12:17:42 -------------------------------------------------------------------------------- Vitals Details Patient Name: Date of Service: [place]PP[place] RD, [place] R[place] [name] 04/06/2022 11:30 A M Medical Record Number: [id number] Patient Account Number: [id number] Date of Birth/Sex: Treating RN: 12-23-1940 (81 y.o. Drema Pry Primary Care Maahir Horst: Tommi Rumps Other Clinician: Referring Charlotta Lapaglia: Treating Leny Morozov/Extender: Conni Slipper in Treatment: 4 Vital Signs Time Taken: 12:10 Temperature (F): 97.9 Height (in): 63 Pulse (bpm): 62 Weight (lbs): 120 Respiratory Rate (breaths/min): 18 Body Mass Index (BMI): 21.3 Blood Pressure (mmHg): 157/60 Reference Range: 80 - 120  mg / dl Hossain, Kaegan G ([id number]) [id number] Page 9 of 9 Electronic Signature(s) Signed: 04/06/2022 4:44:11 PM By: Rosalio Loud MSN RN CNS WTA Previous Signature: 04/06/2022 2:05:53 PM Version By: Rosalio Loud MSN RN CNS WTA Entered By: Rosalio Loud on 04/06/2022 16:44:11

## 2022-04-06 NOTE — Progress Notes (Signed)
Kelly [name], Kelly [name] ([id number]) [id number] Page 1 of 7 Visit Report for 04/06/2022 Chief Complaint Document Details Patient Name: Date of Service: [facility] [name], [name] 04/06/2022 11:30 A M Medical Record Number: [id number] Patient Account Number: [id number] Date of Birth/Sex: Treating RN: 1940-05-11 (81 y.o. Drema Pry Primary Care Provider: Tommi Rumps Other Clinician: Referring Provider: Treating Provider/Extender: Conni Slipper in Treatment: 4 Information Obtained from: Patient Chief Complaint Right LE Ulcer 03/09/2022; patient is here for review of a wound on the right anterior mid tibia which is a surgical wound Electronic Signature(s) Signed: 04/06/2022 1:36:01 PM By: Kalman Shan DO Entered By: Kalman Shan on 04/06/2022 12:41:08 -------------------------------------------------------------------------------- Debridement Details Patient Name: Date of Service: Kelly RD, Kelly RGA [name] 04/06/2022 11:30 A M Medical Record Number: [id number] Patient Account Number: [id number] Date of Birth/Sex: Treating RN: 1940/10/10 (81 y.o. Drema Pry Primary Care Provider: Tommi Rumps Other Clinician: Referring Provider: Treating Provider/Extender: Conni Slipper in Treatment: 4 Debridement Performed for Assessment: Wound #2 Right,Anterior Lower Leg Performed By: Physician Kalman Shan, MD Debridement Type: Debridement Level of Consciousness (Pre-procedure): Awake and Alert Pre-procedure Verification/Time Out Yes - 12:10 Taken: Start Time: 12:10 T Area Debrided (L x W): otal 1.2 (cm) x 0.3 (cm) = 0.36 (cm) Tissue and other material debrided: Viable, Non-Viable, Slough, Slough Level: Non-Viable Tissue Debridement Description: Selective/Open Wound Instrument: Curette Bleeding: Minimum Hemostasis Achieved: Pressure Response to Treatment: Procedure was tolerated  well Level of Consciousness (Post- Awake and Alert procedure): Kelly [name], Kelly [name] ([id number]) [id number] Page 2 of 7 Post Debridement Measurements of Total Wound Length: (cm) 1.2 Width: (cm) 0.3 Depth: (cm) 0.3 Volume: (cm) 0.085 Character of Wound/Ulcer Post Debridement: Stable Post Procedure Diagnosis Same as Pre-procedure Electronic Signature(s) Signed: 04/06/2022 4:46:07 PM By: Rosalio Loud MSN RN CNS WTA Signed: 04/07/2022 9:12:27 AM By: Kalman Shan DO Entered By: Rosalio Loud on 04/06/2022 16:46:07 -------------------------------------------------------------------------------- HPI Details Patient Name: Date of Service: Kelly RD, Kelly RGA [name] 04/06/2022 11:30 A M Medical Record Number: [id number] Patient Account Number: [id number] Date of Birth/Sex: Treating RN: 02-05-1941 (81 y.o. Drema Pry Primary Care Provider: Tommi Rumps Other Clinician: Referring Provider: Treating Provider/Extender: Conni Slipper in Treatment: 4 History of Present Illness HPI Description: 10-28-2021 upon evaluation today patient appears to be doing somewhat poorly in regard to a wound on her right leg. This happened when she had a concrete birdbath fall and hit her leg on May 16. She went to urgent care 2 days later since that time she has been on 2 rounds of doxycycline as well as a round of clindamycin the good news is I see no evidence of infection at this point. Fortunately there is no signs of active infection locally or systemically which is great news and overall I think you are on the right track as far as healing is concerned I do believe that there are some things we can do to help her out here. The patient does have a history of diabetes mellitus type 2, hypertension, chronic kidney disease stage IV, and chronic venous insufficiency/hypertension. Her most recent hemoglobin A1c was 8.5 on 09-29-2021. 11-04-2021 upon  evaluation today patient appears to be doing well currently in regard to her wound. She has been tolerating the dressing changes without complication. Fortunately I do not see any evidence of infection locally or systemically which is great news. 11-18-2021 upon evaluation today patient appears to be doing well currently in regard to her wound.  She has been tolerating the dressing changes without complication. Fortunately there does not appear to be any evidence of active infection locally or systemically at this time which is great news. No fevers, chills, nausea, vomiting, or diarrhea. READMISSION 03/09/2022 This is a 81 year old woman we actually had in the clinic for visits in June and July of this year. At this point she had a traumatic wound on the right anterior lower leg which ultimately healed. I did not see that she was actually discharged in a healed state from here that being said it did close over. Roughly 2 weeks ago she had a skin cancer removed by her dermatologist Dr. Evorn Gong from an area on the same leg just above the wound area that we were dealing with. It is sutured. She was seen in the urgent care a week ago because of pain and erythema and put on cephalexin for cellulitis although the patient tells me currently she is on doxycycline. She is using Vaseline gauze changing daily. She comes in today using the Vaseline gauze the area around the wound is somewhat macerated slight drainage from the center of this. She does not have a vascular issue her ABIs in the right leg were normal when she was here during the summer Patient is a type II diabetic, hypertension, chronic kidney disease stage IIIb 11/8; patient presents for follow-up. She states she had the sutures removed by her dermatologist yesterday. She is currently on Bactrim. She denies signs of infection today. She has been using silver alginate to the wound bed. 11/15; patient presents for follow-up. She has been doing  Medihoney with Hydrofera Blue to the wound bed daily. There has been improvement in wound healing. We discussed potentially doing an in office wrap versus compression stockings to help facilitate wound healing. She would like to proceed with an office wrap. Her ABIs on the right was 1.13. 11/22; patient presents for follow-up. At last clinic visit antibiotic ointment with Hydrofera Blue under Kerlix/Coban was used. The next day the patient got the wrap wet and took it off. She has been doing Medihoney and Hydrofera Blue to the wound bed daily. She has no issues or complaints today. Kelly [name], Kelly [name] ([id number]) [id number] Page 3 of 7 11/29; patient presents for follow-up. She has been doing [employer] with Medihoney to the wound bed. She has not been using Tubigrip. Electronic Signature(s) Signed: 04/06/2022 1:36:01 PM By: Kalman Shan DO Entered By: Kalman Shan on 04/06/2022 12:42:54 -------------------------------------------------------------------------------- Physical Exam Details Patient Name: Date of Service: Kelly RD, Kelly RGA [name] 04/06/2022 11:30 A M Medical Record Number: [id number] Patient Account Number: [id number] Date of Birth/Sex: Treating RN: 04/27/1941 (81 y.o. Drema Pry Primary Care Provider: Tommi Rumps Other Clinician: Referring Provider: Treating Provider/Extender: Conni Slipper in Treatment: 4 Constitutional . Cardiovascular . Psychiatric . Notes T the mid tibia there is a vertical wound along an incision site that has dehisced. The surface has granulation tissue and nonviable tissue. No surrounding signs o of infection including increased warmth, erythema or purulent drainage. Venous stasis dermatitis. No significant swelling noted on exam. Electronic Signature(s) Signed: 04/06/2022 1:36:01 PM By: Kalman Shan DO Entered By: Kalman Shan on 04/06/2022  12:46:53 -------------------------------------------------------------------------------- Physician Orders Details Patient Name: Date of Service: Kelly RD, Kelly RGA [name] 04/06/2022 11:30 A M Medical Record Number: [id number] Patient Account Number: [id number] Date of Birth/Sex: Treating RN: 1940-08-08 (81 y.o. Drema Pry Primary Care Provider: Tommi Rumps Other Clinician: Referring Provider: Treating  Provider/Extender: Conni Slipper in Treatment: 4 Verbal / Phone Orders: No Diagnosis Coding ICD-10 Coding Code Description Kelly [name], Kelly [name] ([id number]) [id number] Page 4 of 7 T81.31XD Disruption of external operation (surgical) wound, not elsewhere classified, subsequent encounter L97.818 Non-pressure chronic ulcer of other part of right lower leg with other specified severity I87.311 Chronic venous hypertension (idiopathic) with ulcer of right lower extremity Follow-up Appointments Return Appointment in 1 week. Anesthetic (Use 'Patient Medications' Section for Anesthetic Order Entry) Lidocaine applied to wound bed Wound Treatment Wound #2 - Lower Leg Wound Laterality: Right, Anterior Cleanser: Soap and Water Every Other Day/30 Days Discharge Instructions: Gently cleanse wound with antibacterial soap, rinse and pat dry prior to dressing wounds Topical: Activon Honey Gel, 25 (g) Tube Every Other Day/30 Days Prim Dressing: Hydrofera Blue Ready Transfer Foam, 2.5x2.5 (in/in) Every Other Day/30 Days ary Discharge Instructions: Apply Hydrofera Blue Ready to wound bed as directed Secondary Dressing: (BORDER) Zetuvit Plus SILICONE BORDER Dressing 4x4 (in/in) Every Other Day/30 Days Discharge Instructions: Please do not put silicone bordered dressings under wraps. Use non-bordered dressing only. Secured With: Tubigrip Size D, 3x10 (in/yd) Every Other Day/30 Days Electronic Signature(s) Signed: 04/06/2022 4:50:57 PM By:  Rosalio Loud MSN RN CNS WTA Signed: 04/07/2022 9:12:27 AM By: Kalman Shan DO Previous Signature: 04/06/2022 1:36:01 PM Version By: Kalman Shan DO Entered By: Rosalio Loud on 04/06/2022 16:50:57 -------------------------------------------------------------------------------- Problem List Details Patient Name: Date of Service: Kelly RD, Kelly RGA [name] 04/06/2022 11:30 A M Medical Record Number: [id number] Patient Account Number: [id number] Date of Birth/Sex: Treating RN: [birth year] (81 y.o. Drema Pry Primary Care Provider: Tommi Rumps Other Clinician: Referring Provider: Treating Provider/Extender: Conni Slipper in Treatment: 4 Active Problems ICD-10 Encounter Code Description Active Date MDM Diagnosis T81.31XD Disruption of external operation (surgical) wound, not elsewhere classified, 03/09/2022 No Yes subsequent encounter L97.818 Non-pressure chronic ulcer of other part of right lower leg with other specified 03/09/2022 No Yes severity I87.311 Chronic venous hypertension (idiopathic) with ulcer of right lower extremity 04/06/2022 No Yes Kelly [name], Kelly [name] ([id number]) [id number] Page 5 of 7 Inactive Problems Resolved Problems Electronic Signature(s) Signed: 04/06/2022 1:36:01 PM By: Kalman Shan DO Entered By: Kalman Shan on 04/06/2022 12:40:51 -------------------------------------------------------------------------------- Progress Note Details Patient Name: Date of Service: Kelly RD, Kelly RGA [name] 04/06/2022 11:30 A M Medical Record Number: [id number] Patient Account Number: [id number] Date of Birth/Sex: Treating RN: 1940-06-04 (81 y.o. Drema Pry Primary Care Provider: Tommi Rumps Other Clinician: Referring Provider: Treating Provider/Extender: Conni Slipper in Treatment: 4 Subjective Chief Complaint Information obtained from Patient Right LE  Ulcer 03/09/2022; patient is here for review of a wound on the right anterior mid tibia which is a surgical wound History of Present Illness (HPI) 10-28-2021 upon evaluation today patient appears to be doing somewhat poorly in regard to a wound on her right leg. This happened when she had a concrete birdbath fall and hit her leg on May 16. She went to urgent care 2 days later since that time she has been on 2 rounds of doxycycline as well as a round of clindamycin the good news is I see no evidence of infection at this point. Fortunately there is no signs of active infection locally or systemically which is great news and overall I think you are on the right track as far as healing is concerned I do believe that there are some things we can do to help her out here. The patient does  have a history of diabetes mellitus type 2, hypertension, chronic kidney disease stage IV, and chronic venous insufficiency/hypertension. Her most recent hemoglobin A1c was 8.5 on 09-29-2021. 11-04-2021 upon evaluation today patient appears to be doing well currently in regard to her wound. She has been tolerating the dressing changes without complication. Fortunately I do not see any evidence of infection locally or systemically which is great news. 11-18-2021 upon evaluation today patient appears to be doing well currently in regard to her wound. She has been tolerating the dressing changes without complication. Fortunately there does not appear to be any evidence of active infection locally or systemically at this time which is great news. No fevers, chills, nausea, vomiting, or diarrhea. READMISSION 03/09/2022 This is a 81 year old woman we actually had in the clinic for visits in June and July of this year. At this point she had a traumatic wound on the right anterior lower leg which ultimately healed. I did not see that she was actually discharged in a healed state from here that being said it did close over. Roughly 2  weeks ago she had a skin cancer removed by her dermatologist Dr. Evorn Gong from an area on the same leg just above the wound area that we were dealing with. It is sutured. She was seen in the urgent care a week ago because of pain and erythema and put on cephalexin for cellulitis although the patient tells me currently she is on doxycycline. She is using Vaseline gauze changing daily. She comes in today using the Vaseline gauze the area around the wound is somewhat macerated slight drainage from the center of this. She does not have a vascular issue her ABIs in the right leg were normal when she was here during the summer Patient is a type II diabetic, hypertension, chronic kidney disease stage IIIb 11/8; patient presents for follow-up. She states she had the sutures removed by her dermatologist yesterday. She is currently on Bactrim. She denies signs of infection today. She has been using silver alginate to the wound bed. 11/15; patient presents for follow-up. She has been doing Medihoney with Hydrofera Blue to the wound bed daily. There has been improvement in wound healing. We discussed potentially doing an in office wrap versus compression stockings to help facilitate wound healing. She would like to proceed with an office wrap. Her ABIs on the right was 1.13. 11/22; patient presents for follow-up. At last clinic visit antibiotic ointment with Hydrofera Blue under Kerlix/Coban was used. The next day the patient got the wrap wet and took it off. She has been doing Medihoney and Hydrofera Blue to the wound bed daily. She has no issues or complaints today. 11/29; patient presents for follow-up. She has been doing [employer] with Medihoney to the wound bed. She has not been using Tubigrip. Kelly [name], Kelly [name] ([id number]) [id number] Page 6 of 7 Objective Constitutional Vitals Time Taken: 12:10 PM, Height: 63 in, Weight: 120 lbs, BMI: 21.3, Temperature: 97.9 F,  Pulse: 62 bpm, Respiratory Rate: 18 breaths/min, Blood Pressure: 157/60 mmHg. General Notes: T the mid tibia there is a vertical wound along an incision site that has dehisced. The surface has granulation tissue and nonviable tissue. No o surrounding signs of infection including increased warmth, erythema or purulent drainage. Venous stasis dermatitis. No significant swelling noted on exam. Integumentary (Hair, Skin) Wound #2 status is Open. Original cause of wound was Surgical Injury. The date acquired was: 02/23/2022. The wound has been in treatment 4 weeks. The wound is  located on the Right,Anterior Lower Leg. The wound measures 1.2cm length x 0.3cm width x 0.2cm depth; 0.283cm^2 area and 0.057cm^3 volume. There is Fat Layer (Subcutaneous Tissue) exposed. There is a medium amount of serosanguineous drainage noted. There is medium (34-66%) pink granulation within the wound bed. There is a medium (34-66%) amount of necrotic tissue within the wound bed including Adherent Slough. 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 I think she would benefit from compression therapy and I recommended using the Tubigrip daily. I debrided nonviable tissue. No signs of infection. I recommended continue with Medihoney and Hydrofera Blue. Follow-up in 1 week. Plan 1. In office sharp debridement 2. Medihoney and Hydrofera Blue 3. Tubigrip 4. Follow-up in 1 week Electronic Signature(s) Signed: 04/06/2022 1:36:01 PM By: Kalman Shan DO Entered By: Kalman Shan on 04/06/2022 12:48:32 -------------------------------------------------------------------------------- SuperBill Details Patient Name: Date of  Service: Kelly RD, Kelly RGA [name] 04/06/2022 Medical Record Number: [id number] Patient Account Number: [id number] Date of Birth/Sex: Treating RN: [birth year] (81 y.o. Drema Pry Primary Care Provider: Tommi Rumps Other Clinician: Referring Provider: Treating Provider/Extender: Conni Slipper in Treatment: 4 Diagnosis Coding Kelly [name], Kelly [name] ([id number]) [id number] Page 7 of 7 ICD-10 Codes Code Description T81.31XD Disruption of external operation (surgical) wound, not elsewhere classified, subsequent encounter L97.818 Non-pressure chronic ulcer of other part of right lower leg with other specified severity I87.311 Chronic venous hypertension (idiopathic) with ulcer of right lower extremity Facility Procedures : CPT4 Code: 72094709 Description: 62836 - DEB SUBQ TISSUE 20 SQ CM/< ICD-10 Diagnosis Description L97.818 Non-pressure chronic ulcer of other part of right lower leg with other specified I87.311 Chronic venous hypertension (idiopathic) with ulcer of right lower extremity Modifier: severity Quantity: 1 Physician Procedures : CPT4 Code Description Modifier [id number] - WC PHYS SUBQ TISS 20 SQ CM ICD-10 Diagnosis Description L97.818 Non-pressure chronic ulcer of other part of right lower leg with other specified severity I87.311 Chronic venous hypertension (idiopathic)  with ulcer of right lower extremity Quantity: 1 Electronic Signature(s) Signed: 04/06/2022 4:51:17 PM By: Rosalio Loud MSN RN CNS WTA Signed: 04/07/2022 9:12:27 AM By: Kalman Shan DO Previous Signature: 04/06/2022 1:36:01 PM Version By: Kalman Shan DO Entered By: Rosalio Loud on 04/06/2022 16:51:17

## 2022-04-08 ENCOUNTER — Encounter: Payer: Self-pay | Admitting: Family Medicine

## 2022-04-08 ENCOUNTER — Ambulatory Visit (INDEPENDENT_AMBULATORY_CARE_PROVIDER_SITE_OTHER): Payer: Medicare Other | Admitting: Family Medicine

## 2022-04-08 VITALS — BP 128/70 | HR 69 | Temp 97.8°F | Ht 63.0 in | Wt 125.0 lb

## 2022-04-08 DIAGNOSIS — S81819A Laceration without foreign body, unspecified lower leg, initial encounter: Secondary | ICD-10-CM

## 2022-04-08 DIAGNOSIS — Z794 Long term (current) use of insulin: Secondary | ICD-10-CM

## 2022-04-08 DIAGNOSIS — M810 Age-related osteoporosis without current pathological fracture: Secondary | ICD-10-CM | POA: Diagnosis not present

## 2022-04-08 DIAGNOSIS — N184 Chronic kidney disease, stage 4 (severe): Secondary | ICD-10-CM | POA: Diagnosis not present

## 2022-04-08 DIAGNOSIS — Z23 Encounter for immunization: Secondary | ICD-10-CM

## 2022-04-08 DIAGNOSIS — I10 Essential (primary) hypertension: Secondary | ICD-10-CM | POA: Diagnosis not present

## 2022-04-08 DIAGNOSIS — E1122 Type 2 diabetes mellitus with diabetic chronic kidney disease: Secondary | ICD-10-CM | POA: Diagnosis not present

## 2022-04-08 MED ORDER — DENOSUMAB 60 MG/ML ~~LOC~~ SOSY
60.0000 mg | PREFILLED_SYRINGE | Freq: Once | SUBCUTANEOUS | Status: AC
  Administered 2022-04-08: 60 mg via SUBCUTANEOUS

## 2022-04-08 NOTE — Assessment & Plan Note (Signed)
Patient reports this is healing well. She will continue to follow with wound clinic weekly.

## 2022-04-08 NOTE — Assessment & Plan Note (Addendum)
Remains poorly controlled. She will continue her Victoza 1.8 mg daily and increase her Basaglar to 8 units daily and then every 3 days from here on out she can increase her Basaglar by 2 units until her fasting sugars are between 80 and 130.  She will not increase by 16 units daily.  She will continue to monitor her sugars with her freestyle libre as that has been beneficial. She will follow-up in 3 months.

## 2022-04-08 NOTE — Patient Instructions (Signed)
Nice to see you. We are going to increase your Basaglar to 8 units daily.  You can subsequently increase this by 2 units every 3 days until your fasting glucose is between 80 and 130.  Do not increase it past 16 units.  If you get to 16 units in your fasting glucose is not in the range as listed previously please contact us.

## 2022-04-08 NOTE — Progress Notes (Signed)
Tommi Rumps, MD Phone: [phone]  Kelly [name] is a 81 y.o. female who presents today for f/u.  Diabetes: patient does check sugars at home. Fasting sugars typically are in the 160s. She denies vision changes, polyuria, polydipsia, and frequent hypoglycemic episodes. She denies any side effects from Victoza and her insulin. She is requesting to have Victoza prior authorization forms completed before January due to a new prior auth requirement. Patient wonders if she needs to make adjustments to her diabetes medications because her A1c last week was 8.9. She prefers to not take Mounjaro due to her history of severe nausea. She also cannot take Wilder Glade due to acquiring yeast infections on that medication.   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 She reports that she sees wound clinic every Wednesday to have her wound redressed with medical honey. She states that her wound is healing very well and her previous leg swelling has resolved with use of compression stockings.   HYPERTENSION Disease Monitoring Home BP Monitoring not sure what they have been as she did not bring her book Chest pain- no    Dyspnea- no Medications Compliance-  taking amlodipine, losartan.   Edema- no BMET    Component Value Date/Time   NA 145 11/08/2021 1414   NA 144 07/05/2018 0000   NA 137 01/18/2013 1036   K 3.6 11/08/2021 1414   K 3.9 01/18/2013 1036   CL 111 11/08/2021 1414   CL 103 01/18/2013 1036   CO2 26 11/08/2021 1414   CO2 26 01/18/2013 1036   GLUCOSE 131 (H) 11/08/2021 1414   GLUCOSE 342 (H) 01/18/2013 1036   BUN 14 11/08/2021 1414   BUN 35 (A) 07/05/2018 0000   BUN 14 01/18/2013 1036   CREATININE 1.56 (H) 11/08/2021 1414   CREATININE 1.48 (H) 01/18/2013 1036   CALCIUM 8.6 11/26/2021 1135   CALCIUM 9.3 01/18/2013 1036   GFRNONAA 24 (L) 12/11/2020 1712   GFRNONAA 35 (L) 01/18/2013 1036   GFRAA 19 (L) 10/20/2019 1735   GFRAA  41 (L) 01/18/2013 1036     Social History   Tobacco Use  Smoking Status Never  Smokeless Tobacco Never    Current Outpatient Medications on File Prior to Visit  Medication Sig Dispense Refill   amLODipine (NORVASC) 5 MG tablet TAKE 1 TABLET DAILY 90 tablet 3   aspirin EC 81 MG tablet Take 81 mg by mouth daily.     atorvastatin (LIPITOR) 80 MG tablet TAKE 1 TABLET DAILY 90 tablet 3   BD PEN NEEDLE NANO 2ND GEN 32G X 4 MM MISC USE DAILY WITH VICTOZA AND BASAGLAR 180 each 0   calcitRIOL (ROCALTROL) 0.25 MCG capsule Take 0.25 mcg by mouth daily.     Cholecalciferol (VITAMIN D) 50 MCG (2000 UT) CAPS Take 2,000 Units by mouth daily.      Continuous Blood Gluc Sensor (FREESTYLE LIBRE 2 SENSOR) MISC USE TO CHECK SUGAR AT LEAST4 TIMES A DAY 6 each 3   cyclobenzaprine (FLEXERIL) 10 MG tablet TAKE 1/2 TABLET ('5MG'$  TOTAL)3 TIMES A DAY AS NEEDED    FOR MUSCLE SPASMS 45 tablet 0   denosumab (PROLIA) 60 MG/ML SOSY injection Inject 60 mg into the skin every 6 (six) months.     doxycycline (VIBRAMYCIN) 100 MG capsule Take 100 mg by mouth 2 (two) times daily.     furosemide (LASIX) 40 MG tablet Take 40 mg by mouth daily as needed.  gabapentin (NEURONTIN) 100 MG capsule Take 1 capsule (100 mg total) by mouth 3 (three) times daily. 180 capsule 1   glucosamine-chondroitin 500-400 MG tablet Take 1 tablet by mouth 3 (three) times daily.     glucose blood (ACCU-CHEK AVIVA PLUS) test strip Use to check blood sugar up to 4 times daily 100 each 3   HYDROcodone-acetaminophen (NORCO/VICODIN) 5-325 MG tablet Take 1 tablet by mouth every 4 (four) hours as needed.     Insulin Glargine (BASAGLAR KWIKPEN) 100 UNIT/ML INJECT 20 UNITS TOTAL      SUBCUTANEOUSLY DAILY. 15 mL 0   Lancets MISC Use up to 4 times daily to check blood sugars. 200 each 11   liraglutide (VICTOZA) 18 MG/3ML SOPN Inject 1.8 mg into the skin daily. 27 mL 1   losartan (COZAAR) 50 MG tablet TAKE 1 TABLET DAILY 90 tablet 1   Melatonin 5 MG CHEW Chew  5 mg by mouth daily.     mupirocin ointment (BACTROBAN) 2 % Apply 1 application. topically 2 (two) times daily. Right leg wound 30 g 0   potassium chloride (K-DUR) 10 MEQ tablet Take 10 mEq by mouth daily as needed.     sertraline (ZOLOFT) 50 MG tablet Take 1 tablet (50 mg total) by mouth daily. 90 tablet 2   triamcinolone ointment (KENALOG) 0.1 % Apply 1 Application topically 2 (two) times daily as needed (rash). 80 g 0   Current Facility-Administered Medications on File Prior to Visit  Medication Dose Route Frequency Provider Last Rate Last Admin   denosumab (PROLIA) injection 60 mg  60 mg Subcutaneous Q6 months [name], MD   60 mg at 04/05/21 1307     ROS see history of present illness  Objective  Vitals:   04/08/22 1416  BP: 128/70  Pulse: 69  Temp: 97.8 F (36.6 C)  SpO2: 97%    BP Readings from Last 3 Encounters:  04/08/22 128/70  02/28/22 (!) 150/79  12/03/21 (!) 167/84   Wt Readings from Last 3 Encounters:  04/08/22 125 lb (56.7 kg)  12/09/21 126 lb (57.2 kg)  12/01/21 126 lb 9.6 oz (57.4 kg)    Physical Exam Constitutional:      Appearance: Normal appearance.  HENT:     Head: Normocephalic and atraumatic.  Cardiovascular:     Rate and Rhythm: Normal rate and regular rhythm.  Pulmonary:     Effort: Pulmonary effort is normal.     Breath sounds: Normal breath sounds.  Musculoskeletal:     Comments: Wound on anterior right shin. Dermatology removed a skin cancer, currently healing and patient sees wound clinic on Wednesdays.  Skin:    General: Skin is warm and dry.  Neurological:     Mental Status: She is alert.      Assessment/Plan: Please see individual problem list.  Problem List Items Addressed This Visit     Hypertension (Chronic)    Well controlled today. She will get me her readings. Continue amlodipine and losartan.       Type 2 diabetes mellitus (HCC) - Primary (Chronic)    Remains poorly controlled. She will continue her  Victoza 1.8 mg daily and increase her Basaglar to 8 units daily and then every 3 days from here on out she can increase her Basaglar by 2 units until her fasting sugars are between 80 and 130.  She will not increase by 16 units daily.  She will continue to monitor her sugars with her freestyle libre as that  has been beneficial. She will follow-up in 3 months.       Skin tear of lower leg without complication, initial encounter    Patient reports this is healing well. She will continue to follow with wound clinic weekly.        Health Maintenance: patient got her Prolia injection for osteoporosis and her flu vaccine today.  Return in about 3 months (around 07/08/2022) for dm.   Marisa Cyphers, Medical Student [place]

## 2022-04-08 NOTE — Assessment & Plan Note (Signed)
Well controlled today. She will get me her readings. Continue amlodipine and losartan.

## 2022-04-08 NOTE — Progress Notes (Signed)
Patient seen along with medical student Zada Zhong.  I personally evaluated this patient along with the student, and verified all aspects of the history, physical exam, and medical decision making as documented by the student.  I agree with the student's documentation and have made all necessary edits.  Nigel Ericsson, MD  

## 2022-04-08 NOTE — Addendum Note (Signed)
Addended by: Jeralyn Bennett A on: 04/08/2022 03:59 PM   Modules accepted: Orders

## 2022-04-11 ENCOUNTER — Ambulatory Visit (INDEPENDENT_AMBULATORY_CARE_PROVIDER_SITE_OTHER): Payer: Medicare Other | Admitting: Internal Medicine

## 2022-04-11 ENCOUNTER — Telehealth: Payer: Self-pay

## 2022-04-11 VITALS — BP 125/69 | HR 70 | Resp 14 | Ht 63.0 in | Wt 123.0 lb

## 2022-04-11 DIAGNOSIS — Z7189 Other specified counseling: Secondary | ICD-10-CM

## 2022-04-11 DIAGNOSIS — I10 Essential (primary) hypertension: Secondary | ICD-10-CM | POA: Diagnosis not present

## 2022-04-11 DIAGNOSIS — G4733 Obstructive sleep apnea (adult) (pediatric): Secondary | ICD-10-CM | POA: Diagnosis not present

## 2022-04-11 NOTE — Telephone Encounter (Signed)
Patient dropped off blood pressure readings.  I placed the paper in Dr. Georges Mouse color folder up front.

## 2022-04-11 NOTE — Progress Notes (Signed)
[facility] [place], [place] [address]  Pulmonary Sleep Medicine   Office Visit Note  Patient Name: Kelly [name] DOB: 04-08-1941 MRN [id number]    Chief Complaint: Obstructive Sleep Apnea visit  Brief History:  Kelly [name] presents for a follow up consult for sleep evaluation. The patient has a 14 year history of sleep apnea and is currently on a CPAP. Sleep quality is good. This is noted most nights. Prior to using a PAP, the patient's bed partner/ family reported the following symptoms:  snoring at night. The patient relates the following symptoms currently: None. The patient goes to sleep at 12am and wakes up at 9am. The patient denies a history of psychiatric problems. The Epworth Sleepiness Score is 2 out of 24 . The patient's STOP-BANG score is 2. The patient relates  Cardiovascular risk factors include: hypertension.. The patient is currently on a PAP@ 4 cmH2O. The patient reports using her CPAP and feels rested after sleeping with PAP.  The patient reports benefiting from PAP use and would like for her/him to continue using PAP.  Reported sleepiness is  improved. The compliance download shows  88% compliance with an average use time of 6 hours 21 minutes. The AHI is 0.9.  The patient continues to require PAP therapy as a medical necessity in order to eliminate his/her sleep apnea.   Her machine has reached end of life and she would like to get a replacement machine.  ROS  General: (-) fever, (-) chills, (-) night sweat Nose and Sinuses: (-) nasal stuffiness or itchiness, (-) postnasal drip, (-) nosebleeds, (-) sinus trouble. Mouth and Throat: (-) sore throat, (-) hoarseness. Neck: (-) swollen glands, (-) enlarged thyroid, (-) neck pain. Respiratory: - cough, - shortness of breath, - wheezing. Neurologic: - numbness, - tingling. Psychiatric: - anxiety, - depression   Current Medication: Outpatient Encounter Medications as of 04/11/2022   Medication Sig Note   amLODipine (NORVASC) 5 MG tablet TAKE 1 TABLET DAILY    aspirin EC 81 MG tablet Take 81 mg by mouth daily.    atorvastatin (LIPITOR) 80 MG tablet TAKE 1 TABLET DAILY    BD PEN NEEDLE NANO 2ND GEN 32G X 4 MM MISC USE DAILY WITH VICTOZA AND BASAGLAR    calcitRIOL (ROCALTROL) 0.25 MCG capsule Take 0.25 mcg by mouth daily.    Cholecalciferol (VITAMIN D) 50 MCG (2000 UT) CAPS Take 2,000 Units by mouth daily.     Continuous Blood Gluc Sensor (FREESTYLE LIBRE 2 SENSOR) MISC USE TO CHECK SUGAR AT LEAST4 TIMES A DAY    cyclobenzaprine (FLEXERIL) 10 MG tablet TAKE 1/2 TABLET ('5MG'$  TOTAL)3 TIMES A DAY AS NEEDED    FOR MUSCLE SPASMS    denosumab (PROLIA) 60 MG/ML SOSY injection Inject 60 mg into the skin every 6 (six) months.    doxycycline (VIBRAMYCIN) 100 MG capsule Take 100 mg by mouth 2 (two) times daily.    furosemide (LASIX) 40 MG tablet Take 40 mg by mouth daily as needed.    gabapentin (NEURONTIN) 100 MG capsule Take 1 capsule (100 mg total) by mouth 3 (three) times daily. 01/27/2021: 1 capsule QPM   glucosamine-chondroitin 500-400 MG tablet Take 1 tablet by mouth 3 (three) times daily. 06/16/2020: Reports taking once a day   glucose blood (ACCU-CHEK AVIVA PLUS) test strip Use to check blood sugar up to 4 times daily    HYDROcodone-acetaminophen (NORCO/VICODIN) 5-325 MG tablet Take 1 tablet by mouth every 4 (four) hours as needed.  Insulin Glargine (BASAGLAR KWIKPEN) 100 UNIT/ML INJECT 20 UNITS TOTAL      SUBCUTANEOUSLY DAILY.    Lancets MISC Use up to 4 times daily to check blood sugars.    liraglutide (VICTOZA) 18 MG/3ML SOPN Inject 1.8 mg into the skin daily.    losartan (COZAAR) 50 MG tablet TAKE 1 TABLET DAILY    Melatonin 5 MG CHEW Chew 5 mg by mouth daily.    mupirocin ointment (BACTROBAN) 2 % Apply 1 application. topically 2 (two) times daily. Right leg wound    potassium chloride (K-DUR) 10 MEQ tablet Take 10 mEq by mouth daily as needed.    sertraline (ZOLOFT) 50 MG  tablet Take 1 tablet (50 mg total) by mouth daily.    triamcinolone ointment (KENALOG) 0.1 % Apply 1 Application topically 2 (two) times daily as needed (rash).    Facility-Administered Encounter Medications as of 04/11/2022  Medication   denosumab (PROLIA) injection 60 mg    Surgical History: Past Surgical History:  Procedure Laterality Date   APPENDECTOMY     BREAST BIOPSY Right 06/19/2018   affirm stereo/x clip/COLUMNAR CELL CHANGE WITH MICROCALCIFICATIONS. FIBROCYSTIC CHANGES WITH MICROCALCIFICATIONS   cataract  03/2017   CHOLECYSTECTOMY     COLONOSCOPY     COLONOSCOPY WITH PROPOFOL N/A 08/22/2017   Procedure: COLONOSCOPY WITH PROPOFOL;  Surgeon: Jonathon Bellows, MD;  Location: [facility] ENDOSCOPY;  Service: Gastroenterology;  Laterality: N/A;   ESOPHAGOGASTRODUODENOSCOPY     ESOPHAGOGASTRODUODENOSCOPY (EGD) WITH PROPOFOL N/A 03/23/2016   Procedure: ESOPHAGOGASTRODUODENOSCOPY (EGD) WITH PROPOFOL;  Surgeon: Manya Silvas, MD;  Location: [facility] ENDOSCOPY;  Service: Endoscopy;  Laterality: N/A;   FLEXIBLE SIGMOIDOSCOPY N/A 02/06/2018   Procedure: FLEXIBLE SIGMOIDOSCOPY;  Surgeon: Jonathon Bellows, MD;  Location: [facility] ENDOSCOPY;  Service: Gastroenterology;  Laterality: N/A;   FLEXIBLE SIGMOIDOSCOPY N/A 12/03/2018   Procedure: FLEXIBLE SIGMOIDOSCOPY;  Surgeon: Jonathon Bellows, MD;  Location: [facility] ENDOSCOPY;  Service: Gastroenterology;  Laterality: N/A;   RECTAL PROLAPSE REPAIR  2012   x2    Medical History: Past Medical History:  Diagnosis Date   Colon polyps    Depression    DM (diabetes mellitus), type 2 with renal complications ([place])    Hyperlipidemia    Hypertension    Kidney disease    Kidney stones    Shingles 12/05/2019   Sleep apnea    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     Family History: Non contributory to the present illness  Social History: Social History   Socioeconomic History   Marital status: Widowed    Spouse name: Not on file   Number of  children: Not on file   Years of education: Not on file   Highest education level: High school graduate  Occupational History   Occupation: retired   Tobacco Use   Smoking status: Never   Smokeless tobacco: Never  Vaping Use   Vaping Use: Never used  Substance and Sexual Activity   Alcohol use: No   Drug use: No   Sexual activity: Not Currently    Partners: Male  Other Topics Concern   Not on file  Social History Narrative   Not on file   Social Determinants of Health   Financial Resource Strain: Low Risk  (06/10/2021)   Overall Financial Resource Strain (CARDIA)    Difficulty of Paying Living Expenses: Not hard at all  Food Insecurity: No Food Insecurity (11/03/2020)   Hunger Vital Sign    Worried About Running Out of Food  in the Last Year: Never true    [place] in the Last Year: Never true  Transportation Needs: No Transportation Needs (11/03/2020)   PRAPARE - Hydrologist (Medical): No    Lack of Transportation (Non-Medical): No  Physical Activity: Insufficiently Active (11/03/2020)   Exercise Vital Sign    Days of Exercise per Week: 3 days    Minutes of Exercise per Session: 20 min  Stress: No Stress Concern Present (11/03/2020)   [place]    Feeling of Stress : Only a little  Social Connections: Unknown (11/03/2020)   Social Connection and Isolation Panel [NHANES]    Frequency of Communication with Friends and Family: More than three times a week    Frequency of Social Gatherings with Friends and Family: Not on file    Attends Religious Services: Not on file    Active Member of Clubs or Organizations: Not on file    Attends Archivist Meetings: Not on file    Marital Status: Not on file  Intimate Partner Violence: Not At Risk (11/03/2020)   Humiliation, Afraid, Rape, and Kick questionnaire    Fear of Current or Ex-Partner: No    Emotionally Abused: No     Physically Abused: No    Sexually Abused: No    Vital Signs: Blood pressure 125/69, pulse 70, resp. rate 14, height '5\' 3"'$  (1.6 m), weight 123 lb (55.8 kg), SpO2 98 %. Body mass index is 21.79 kg/m.    Examination: General Appearance: The patient is well-developed, well-nourished, and in no distress. Neck Circumference: 32 cm Skin: Gross inspection of skin unremarkable. Head: normocephalic, no gross deformities. Eyes: no gross deformities noted. ENT: ears appear grossly normal Neurologic: Alert and oriented. No involuntary movements.  STOP BANG RISK ASSESSMENT S (snore) Have you been told that you snore?     NO   T (tired) Are you often tired, fatigued, or sleepy during the day?   NO  O (obstruction) Do you stop breathing, choke, or gasp during sleep? NO   P (pressure) Do you have or are you being treated for high blood pressure? YES   B (BMI) Is your body index greater than 35 kg/m? NO   A (age) Are you 63 years old or older? YES   N (neck) Do you have a neck circumference greater than 16 inches?   NO   G (gender) Are you a female? NO   TOTAL STOP/BANG "YES" ANSWERS 2       A STOP-Bang score of 2 or less is considered low risk, and a score of 5 or more is high risk for having either moderate or severe OSA. For people who score 3 or 4, doctors may need to perform further assessment to determine how likely they are to have OSA.         EPWORTH SLEEPINESS SCALE:  Scale:  (0)= no chance of dozing; (1)= slight chance of dozing; (2)= moderate chance of dozing; (3)= high chance of dozing  Chance  Situtation    Sitting and reading: 0    Watching TV: 0    Sitting Inactive in public: 0    As a passenger in car: 1      Lying down to rest: 1    Sitting and talking: 0    Sitting quielty after lunch: 0    In a car, stopped in traffic: 0   TOTAL SCORE:  2 out of 24    SLEEP STUDIES:  PSG (03/14/08)  AHI 25.3, min SPO2 77%   CPAP COMPLIANCE DATA:  Date  Range: 01/06/22 - 04/05/22  Average Daily Use: 6 hours 21 minutes  Median Use: 6 hours 14 minutes  Compliance for > 4 Hours: 79 days  AHI: 0.9 respiratory events per hour  Days Used: 89/90  Mask Leak: 13.8  95th Percentile Pressure: 4 cmh20         LABS: Recent Results (from the past 2160 hour(s))  POCT HgB A1C     Status: Abnormal   Collection Time: 04/04/22  3:11 PM  Result Value Ref Range   Hemoglobin A1C 8.9 (A) 4.0 - 5.6 %   HbA1c POC (<> result, manual entry) 8.9 4.0 - 5.6 %   HbA1c, POC (prediabetic range) 8.9 (A) 5.7 - 6.4 %   HbA1c, POC (controlled diabetic range) 8.9 (A) 0.0 - 7.0 %    Radiology: No results found.  No results found.  No results found.    Assessment and Plan: Patient Active Problem List   Diagnosis Date Noted   Leg swelling 12/01/2021   Arthralgia 11/08/2021   Skin tear of lower leg without complication, initial encounter 10/01/2021   Constipation 07/02/2021   Elevated lipase 07/02/2021   Thrombocytopenia (HCC) 07/02/2021   Hair loss 07/02/2021   Soft tissue lesion 07/02/2021   Anxiety 06/11/2021   Trochanteric bursitis, right hip 12/08/2020   Foraminal stenosis of lumbosacral region 12/08/2020   Lumbar herniated disc 12/08/2020   Lumbar radiculopathy 12/02/2020   Rash 09/29/2020   Onychomycosis 08/11/2020   Sleeping difficulty 08/11/2020   Allergic rhinitis 08/11/2020   CPAP use counseling 06/01/2020   Dysuria 03/02/2020   Edema of lower extremity 11/19/2019   Hyperlipidemia 11/15/2019   Low back pain 11/15/2019   Dry eyes 10/01/2019   Tick bite 10/01/2019   Personal history of other specified conditions 07/03/2019   Fall 04/15/2019   Type 2 diabetes mellitus ([place]) 03/18/2019   Hypertension 03/18/2019   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   Headache disorder 09/10/2018   Cervico-occipital neuralgia 07/23/2018   Osteoporosis 07/23/2018   Cramp and spasm 02/12/2018   Paresthesia of hand  10/25/2017   Lesion of rectum 09/07/2017   Loss of height 08/15/2017   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   Cervical spondylosis without myelopathy 08/15/2017   DDD (degenerative disc disease), cervical 08/15/2017   Bursitis of hip 08/15/2017   Female stress incontinence 08/15/2017   Muscle weakness 08/15/2017   Neck pain 08/15/2017   Spondylolisthesis, congenital 08/15/2017   Radial styloid tenosynovitis 08/15/2017   Senile osteoporosis 08/15/2017   Hip pain 08/15/2017   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   Rectal hemorrhage 07/25/2017   Essential tremor 04/26/2017   Other fatigue 03/22/2017   Left ventricular hypertrophy 10/12/2016   Chronic kidney disease, stage 4 (severe) ([place]) 08/26/2016   OSA on CPAP 08/26/2016   1. OSA on CPAP The patient does tolerate PAP and reports  benefit from PAP use. Machine has reached end of life and must be replaced.  The patient was reminded how to clean equipment and advised to replace supplies routinely.  The compliance is good. The AHI is 0.9.   OSA on cpap- controlled.  Replace machine. Continue with excellent compliance with pap. CPAP continues to be medically necessary to treat this patient's OSA. F/u 30 days after set up.   2. CPAP use counseling CPAP Counseling: had a lengthy discussion  with the patient regarding the importance of PAP therapy in management of the sleep apnea. Patient appears to understand the risk factor reduction and also understands the risks associated with untreated sleep apnea. Patient will try to make a good faith effort to remain compliant with therapy. Also instructed the patient on proper cleaning of the device including the water must be changed daily if possible and use of distilled water is preferred. Patient understands that the machine should be regularly cleaned with appropriate recommended cleaning solutions that do not damage the PAP machine for example given white vinegar and water rinses. Other  methods such as ozone treatment may not be as good as these simple methods to achieve cleaning.   3. Primary hypertension  Hypertension Counseling:   The following hypertensive lifestyle modification were recommended and discussed:  1. Limiting alcohol intake to less than 1 oz/day of ethanol:(24 oz of beer or 8 oz of wine or 2 oz of 100-proof whiskey). 2. Take baby ASA 81 mg daily. 3. Importance of regular aerobic exercise and losing weight. 4. Reduce dietary saturated fat and cholesterol intake for overall cardiovascular health. 5. Maintaining adequate dietary potassium, calcium, and magnesium intake. 6. Regular monitoring of the blood pressure. 7. Reduce sodium intake to less than 100 mmol/day (less than 2.3 gm of sodium or less than 6 gm of sodium choride)     General Counseling: I have discussed the findings of the evaluation and examination with Kelly [name].  I have also discussed any further diagnostic evaluation thatmay be needed or ordered today. Kelly [name] verbalizes understanding of the findings of todays visit. We also reviewed her medications today and discussed drug interactions and side effects including but not limited excessive drowsiness and altered mental states. We also discussed that there is always a risk not just to her but also people around her. she has been encouraged to call the office with any questions or concerns that should arise related to todays visit.  No orders of the defined types were placed in this encounter.       I have personally obtained a history, examined the patient, evaluated laboratory and imaging results, formulated the assessment and plan and placed orders. This patient was seen today by Tressie Ellis, PA-C in collaboration with Dr. Devona Konig.   Allyne Gee, MD [facility] Diplomate ABMS Pulmonary Critical Care Medicine and Sleep Medicine

## 2022-04-11 NOTE — Patient Instructions (Signed)

## 2022-04-12 NOTE — Telephone Encounter (Signed)
Picked up from up front made a copy and put it in Dr. Ellen Henri review basket

## 2022-04-13 ENCOUNTER — Encounter: Payer: Medicare Other | Attending: Internal Medicine | Admitting: Internal Medicine

## 2022-04-13 DIAGNOSIS — I87311 Chronic venous hypertension (idiopathic) with ulcer of right lower extremity: Secondary | ICD-10-CM | POA: Diagnosis not present

## 2022-04-13 DIAGNOSIS — E1140 Type 2 diabetes mellitus with diabetic neuropathy, unspecified: Secondary | ICD-10-CM | POA: Diagnosis not present

## 2022-04-13 DIAGNOSIS — T2101XA Burn of unspecified degree of chest wall, initial encounter: Secondary | ICD-10-CM | POA: Diagnosis not present

## 2022-04-13 DIAGNOSIS — E11622 Type 2 diabetes mellitus with other skin ulcer: Secondary | ICD-10-CM | POA: Insufficient documentation

## 2022-04-13 DIAGNOSIS — N186 End stage renal disease: Secondary | ICD-10-CM | POA: Insufficient documentation

## 2022-04-13 DIAGNOSIS — T8131XD Disruption of external operation (surgical) wound, not elsewhere classified, subsequent encounter: Secondary | ICD-10-CM | POA: Insufficient documentation

## 2022-04-13 DIAGNOSIS — I120 Hypertensive chronic kidney disease with stage 5 chronic kidney disease or end stage renal disease: Secondary | ICD-10-CM | POA: Diagnosis not present

## 2022-04-13 DIAGNOSIS — M5136 Other intervertebral disc degeneration, lumbar region: Secondary | ICD-10-CM | POA: Diagnosis not present

## 2022-04-13 DIAGNOSIS — M5032 Other cervical disc degeneration, mid-cervical region, unspecified level: Secondary | ICD-10-CM | POA: Diagnosis not present

## 2022-04-13 DIAGNOSIS — I872 Venous insufficiency (chronic) (peripheral): Secondary | ICD-10-CM | POA: Insufficient documentation

## 2022-04-13 DIAGNOSIS — E1165 Type 2 diabetes mellitus with hyperglycemia: Secondary | ICD-10-CM | POA: Diagnosis not present

## 2022-04-13 DIAGNOSIS — L97818 Non-pressure chronic ulcer of other part of right lower leg with other specified severity: Secondary | ICD-10-CM | POA: Diagnosis not present

## 2022-04-13 DIAGNOSIS — Z79899 Other long term (current) drug therapy: Secondary | ICD-10-CM | POA: Diagnosis not present

## 2022-04-13 DIAGNOSIS — M545 Low back pain, unspecified: Secondary | ICD-10-CM | POA: Diagnosis not present

## 2022-04-13 DIAGNOSIS — M1990 Unspecified osteoarthritis, unspecified site: Secondary | ICD-10-CM | POA: Insufficient documentation

## 2022-04-13 DIAGNOSIS — E1122 Type 2 diabetes mellitus with diabetic chronic kidney disease: Secondary | ICD-10-CM | POA: Diagnosis not present

## 2022-04-13 DIAGNOSIS — M25572 Pain in left ankle and joints of left foot: Secondary | ICD-10-CM | POA: Diagnosis not present

## 2022-04-13 DIAGNOSIS — M6281 Muscle weakness (generalized): Secondary | ICD-10-CM | POA: Diagnosis not present

## 2022-04-13 DIAGNOSIS — E1151 Type 2 diabetes mellitus with diabetic peripheral angiopathy without gangrene: Secondary | ICD-10-CM | POA: Diagnosis not present

## 2022-04-13 DIAGNOSIS — Q762 Congenital spondylolisthesis: Secondary | ICD-10-CM | POA: Diagnosis not present

## 2022-04-13 DIAGNOSIS — M533 Sacrococcygeal disorders, not elsewhere classified: Secondary | ICD-10-CM | POA: Diagnosis not present

## 2022-04-13 DIAGNOSIS — M542 Cervicalgia: Secondary | ICD-10-CM | POA: Diagnosis not present

## 2022-04-13 DIAGNOSIS — M7062 Trochanteric bursitis, left hip: Secondary | ICD-10-CM | POA: Diagnosis not present

## 2022-04-13 NOTE — Telephone Encounter (Signed)
I do not see these in my basket. Do you know where they are?

## 2022-04-13 NOTE — Telephone Encounter (Signed)
Form handed to provider

## 2022-04-14 NOTE — Progress Notes (Signed)
Kelly [name], Kelly [name] ([id number]) [id number] Page 1 of 7 Visit Report for 04/13/2022 Chief Complaint Document Details Patient Name: Date of Service: [facility] [address], [name] 04/13/2022 11:30 A M Medical Record Number: [id number] Patient Account Number: [id number] Date of Birth/Sex: Treating RN: [birth year] (81 y.o. Drema Pry Primary Care Provider: Tommi Rumps Other Clinician: Referring Provider: Treating Provider/Extender: Conni Slipper in Treatment: 5 Information Obtained from: Patient Chief Complaint Right LE Ulcer 03/09/2022; patient is here for review of a wound on the right anterior mid tibia which is a surgical wound Electronic Signature(s) Signed: 04/13/2022 12:29:29 PM By: Kalman Shan DO Entered By: Kalman Shan on 04/13/2022 12:26:50 -------------------------------------------------------------------------------- HPI Details Patient Name: Date of Service: [facility], [place] RGA [name] 04/13/2022 11:30 A M Medical Record Number: [id number] Patient Account Number: [id number] Date of Birth/Sex: Treating RN: 1940/09/14 (81 y.o. Drema Pry Primary Care Provider: Tommi Rumps Other Clinician: Referring Provider: Treating Provider/Extender: Conni Slipper in Treatment: 5 History of Present Illness HPI Description: 10-28-2021 upon evaluation today patient appears to be doing somewhat poorly in regard to a wound on her right leg. This happened when she had a concrete birdbath fall and hit her leg on May 16. She went to urgent care 2 days later since that time she has been on 2 rounds of doxycycline as well as a round of clindamycin the good news is I see no evidence of infection at this point. Fortunately there is no signs of active infection locally or systemically which is great news and overall I think you are on the right track as far as healing is concerned I do believe that  there are some things we can do to help her out here. The patient does have a history of diabetes mellitus type 2, hypertension, chronic kidney disease stage IV, and chronic venous insufficiency/hypertension. Her most recent hemoglobin A1c was 8.5 on 09-29-2021. 11-04-2021 upon evaluation today patient appears to be doing well currently in regard to her wound. She has been tolerating the dressing changes without complication. Fortunately I do not see any evidence of infection locally or systemically which is great news. 11-18-2021 upon evaluation today patient appears to be doing well currently in regard to her wound. She has been tolerating the dressing changes without complication. Fortunately there does not appear to be any evidence of active infection locally or systemically at this time which is great news. No fevers, chills, nausea, vomiting, or diarrhea. Kelly [name], Kelly [name] ([id number]) [id number] Page 2 of 7 READMISSION 03/09/2022 This is a 81 year old woman we actually had in the clinic for visits in June and July of this year. At this point she had a traumatic wound on the right anterior lower leg which ultimately healed. I did not see that she was actually discharged in a healed state from here that being said it did close over. Roughly 2 weeks ago she had a skin cancer removed by her dermatologist Dr. Evorn Gong from an area on the same leg just above the wound area that we were dealing with. It is sutured. She was seen in the urgent care a week ago because of pain and erythema and put on cephalexin for cellulitis although the patient tells me currently she is on doxycycline. She is using Vaseline gauze changing daily. She comes in today using the Vaseline gauze the area around the wound is somewhat macerated slight drainage from the center of this. She does not have a vascular issue her  ABIs in the right leg were normal when she was here during the  summer Patient is a type II diabetic, hypertension, chronic kidney disease stage IIIb 11/8; patient presents for follow-up. She states she had the sutures removed by her dermatologist yesterday. She is currently on Bactrim. She denies signs of infection today. She has been using silver alginate to the wound bed. 11/15; patient presents for follow-up. She has been doing Medihoney with Hydrofera Blue to the wound bed daily. There has been improvement in wound healing. We discussed potentially doing an in office wrap versus compression stockings to help facilitate wound healing. She would like to proceed with an office wrap. Her ABIs on the right was 1.13. 11/22; patient presents for follow-up. At last clinic visit antibiotic ointment with Hydrofera Blue under Kerlix/Coban was used. The next day the patient got the wrap wet and took it off. She has been doing Medihoney and Hydrofera Blue to the wound bed daily. She has no issues or complaints today. 11/29; patient presents for follow-up. She has been doing [employer] with Medihoney to the wound bed. She has not been using Tubigrip. 12/6; patient presents for follow-up. She has been using Hydrofera Blue and Medihoney to the wound bed. She has been using Tubigrip. She has no issues or complaints today. Electronic Signature(s) Signed: 04/13/2022 12:29:29 PM By: Kalman Shan DO Entered By: Kalman Shan on 04/13/2022 12:27:13 -------------------------------------------------------------------------------- Physical Exam Details Patient Name: Date of Service: SHEPPA RD, [place] RGA [name] 04/13/2022 11:30 A M Medical Record Number: [id number] Patient Account Number: [id number] Date of Birth/Sex: Treating RN: 22-Nov-1940 (81 y.o. Drema Pry Primary Care Provider: Tommi Rumps Other Clinician: Referring Provider: Treating Provider/Extender: Conni Slipper in Treatment:  5 Constitutional . Cardiovascular . Psychiatric . Notes T the mid tibia there is a vertical wound along an incision site that has dehisced. The surface has granulation tissue And epithelization to the edges o circumferentially. No surrounding signs of infection including increased warmth, erythema or purulent drainage. Venous stasis dermatitis. No significant swelling noted on exam. Electronic Signature(s) Signed: 04/13/2022 12:29:29 PM By: Kalman Shan DO Entered By: Kalman Shan on 04/13/2022 12:27:41 Kelly [name] ([id number]) [id number] Page 3 of 7 -------------------------------------------------------------------------------- Physician Orders Details Patient Name: Date of Service: [facility] RD, [name] 04/13/2022 11:30 A M Medical Record Number: [id number] Patient Account Number: [id number] Date of Birth/Sex: Treating RN: June 22, 1940 (81 y.o. Drema Pry Primary Care Provider: Tommi Rumps Other Clinician: Referring Provider: Treating Provider/Extender: Conni Slipper in Treatment: 5 Verbal / Phone Orders: No Diagnosis Coding Follow-up Appointments Return Appointment in 2 weeks. Anesthetic (Use 'Patient Medications' Section for Anesthetic Order Entry) Lidocaine applied to wound bed Wound Treatment Wound #2 - Lower Leg Wound Laterality: Right, Anterior Cleanser: Soap and Water Every Other Day/30 Days Discharge Instructions: Gently cleanse wound with antibacterial soap, rinse and pat dry prior to dressing wounds Topical: Activon Honey Gel, 25 (g) Tube Every Other Day/30 Days Prim Dressing: Hydrofera Blue Ready Transfer Foam, 2.5x2.5 (in/in) Every Other Day/30 Days ary Discharge Instructions: Apply Hydrofera Blue Ready to wound bed as directed Secondary Dressing: (BORDER) Zetuvit Plus SILICONE BORDER Dressing 4x4 (in/in) Every Other Day/30 Days Discharge Instructions: Please do not put silicone  bordered dressings under wraps. Use non-bordered dressing only. Secured With: Tubigrip Size D, 3x10 (in/yd) Every Other Day/30 Days Electronic Signature(s) Signed: 04/13/2022 12:29:29 PM By: Kalman Shan DO Entered By: Kalman Shan on 04/13/2022 12:28:43 -------------------------------------------------------------------------------- Problem List Details Patient  Name: Date of Service: [facility] RD, [name] 04/13/2022 11:30 A M Medical Record Number: [id number] Patient Account Number: [id number] Date of Birth/Sex: Treating RN: 11-01-1940 (81 y.o. Drema Pry Primary Care Provider: Tommi Rumps Other Clinician: Referring Provider: Treating Provider/Extender: Conni Slipper in Treatment: 5 Active Problems MARKESHA, HANNIG ([id number]) [id number] Page 4 of 7 ICD-10 Encounter Code Description Active Date MDM Diagnosis T81.31XD Disruption of external operation (surgical) wound, not elsewhere classified, 03/09/2022 No Yes subsequent encounter L97.818 Non-pressure chronic ulcer of other part of right lower leg with other specified 03/09/2022 No Yes severity I87.311 Chronic venous hypertension (idiopathic) with ulcer of right lower extremity 04/06/2022 No Yes Inactive Problems Resolved Problems Electronic Signature(s) Signed: 04/13/2022 12:29:29 PM By: Kalman Shan DO Entered By: Kalman Shan on 04/13/2022 12:26:42 -------------------------------------------------------------------------------- Progress Note Details Patient Name: Date of Service: SHEPPA RD, [place] R[name] 04/13/2022 11:30 A M Medical Record Number: [id number] Patient Account Number: [id number] Date of Birth/Sex: Treating RN: 06-01-1940 (81 y.o. Drema Pry Primary Care Provider: Tommi Rumps Other Clinician: Referring Provider: Treating Provider/Extender: Conni Slipper in Treatment: 5 Subjective Chief  Complaint Information obtained from Patient Right LE Ulcer 03/09/2022; patient is here for review of a wound on the right anterior mid tibia which is a surgical wound History of Present Illness (HPI) 10-28-2021 upon evaluation today patient appears to be doing somewhat poorly in regard to a wound on her right leg. This happened when she had a concrete birdbath fall and hit her leg on May 16. She went to urgent care 2 days later since that time she has been on 2 rounds of doxycycline as well as a round of clindamycin the good news is I see no evidence of infection at this point. Fortunately there is no signs of active infection locally or systemically which is great news and overall I think you are on the right track as far as healing is concerned I do believe that there are some things we can do to help her out here. The patient does have a history of diabetes mellitus type 2, hypertension, chronic kidney disease stage IV, and chronic venous insufficiency/hypertension. Her most recent hemoglobin A1c was 8.5 on 09-29-2021. 11-04-2021 upon evaluation today patient appears to be doing well currently in regard to her wound. She has been tolerating the dressing changes without complication. Fortunately I do not see any evidence of infection locally or systemically which is great news. 11-18-2021 upon evaluation today patient appears to be doing well currently in regard to her wound. She has been tolerating the dressing changes without complication. Fortunately there does not appear to be any evidence of active infection locally or systemically at this time which is great news. No fevers, chills, nausea, vomiting, or diarrhea. READMISSION 03/09/2022 This is a 81 year old woman we actually had in the clinic for visits in June and July of this year. At this point she had a traumatic wound on the right anterior lower leg which ultimately healed. I did not see that she was actually discharged in a healed state  from here that being said it did close over. Roughly 2 weeks ago she had a skin cancer removed by her dermatologist Dr. Evorn Gong from an area on the same leg just above the wound area that we were dealing with. It is sutured. She was seen in the urgent care a week ago because of pain and erythema and put on cephalexin for cellulitis although the patient  tells me currently Kelly [name], Kelly [name] ([id number]) [id number] Page 5 of 7 she is on doxycycline. She is using Vaseline gauze changing daily. She comes in today using the Vaseline gauze the area around the wound is somewhat macerated slight drainage from the center of this. She does not have a vascular issue her ABIs in the right leg were normal when she was here during the summer Patient is a type II diabetic, hypertension, chronic kidney disease stage IIIb 11/8; patient presents for follow-up. She states she had the sutures removed by her dermatologist yesterday. She is currently on Bactrim. She denies signs of infection today. She has been using silver alginate to the wound bed. 11/15; patient presents for follow-up. She has been doing Medihoney with Hydrofera Blue to the wound bed daily. There has been improvement in wound healing. We discussed potentially doing an in office wrap versus compression stockings to help facilitate wound healing. She would like to proceed with an office wrap. Her ABIs on the right was 1.13. 11/22; patient presents for follow-up. At last clinic visit antibiotic ointment with Hydrofera Blue under Kerlix/Coban was used. The next day the patient got the wrap wet and took it off. She has been doing Medihoney and Hydrofera Blue to the wound bed daily. She has no issues or complaints today. 11/29; patient presents for follow-up. She has been doing [employer] with Medihoney to the wound bed. She has not been using Tubigrip. 12/6; patient presents for follow-up. She has been using Hydrofera Blue  and Medihoney to the wound bed. She has been using Tubigrip. She has no issues or complaints today. Objective Constitutional Vitals Time Taken: 12:12 PM, Height: 63 in, Weight: 120 lbs, BMI: 21.3, Temperature: 98.0 F, Pulse: 66 bpm, Respiratory Rate: 18 breaths/min, Blood Pressure: 187/79 mmHg. General Notes: T the mid tibia there is a vertical wound along an incision site that has dehisced. The surface has granulation tissue And epithelization to the o edges circumferentially. No surrounding signs of infection including increased warmth, erythema or purulent drainage. Venous stasis dermatitis. No significant swelling noted on exam. Integumentary (Hair, Skin) Wound #2 status is Open. Original cause of wound was Surgical Injury. The date acquired was: 02/23/2022. The wound has been in treatment 5 weeks. The wound is located on the Right,Anterior Lower Leg. The wound measures 1.4cm length x 0.5cm width x 0.2cm depth; 0.55cm^2 area and 0.11cm^3 volume. There is Fat Layer (Subcutaneous Tissue) exposed. There is a medium amount of serosanguineous drainage noted. There is medium (34-66%) pink granulation within the wound bed. There is a medium (34-66%) amount of necrotic tissue within the wound bed including Adherent Slough. 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 I recommended continuing Medihoney and Hydrofera Blue every other day and using Tubigrip daily. Follow-up in 2 weeks. Plan Follow-up Appointments: Return Appointment in 2 weeks. Anesthetic (Use 'Patient Medications' Section for Anesthetic Order Entry): Lidocaine applied to wound bed WOUND #2: - Lower Leg Wound Laterality: Right,  Anterior Cleanser: Soap and Water Every Other Day/30 Days Discharge Instructions: Gently cleanse wound with antibacterial soap, rinse and pat dry prior to dressing wounds Topical: Activon Honey Gel, 25 (g) Tube Every Other Day/30 Days Prim Dressing: Hydrofera Blue Ready Transfer Foam, 2.5x2.5 (in/in) Every Other Day/30 Days ary Discharge Instructions: Apply Hydrofera Blue  Ready to wound bed as directed Secondary Dressing: (BORDER) Zetuvit Plus SILICONE BORDER Dressing 4x4 (in/in) Every Other Day/30 Days Discharge Instructions: Please do not put silicone bordered dressings under wraps. Use non-bordered dressing only. Secured With: Tubigrip Size D, 3x10 (in/yd) Every Other Day/30 Days 1. Medihoney and [address] Kelly [name], Kelly [name] ([id number]) [id number] Page 6 of 7 2. Tubigrip 3. Follow-up in 2 weeks Electronic Signature(s) Signed: 04/13/2022 12:29:29 PM By: Kalman Shan DO Entered By: Kalman Shan on 04/13/2022 12:28:21 -------------------------------------------------------------------------------- ROS/PFSH Details Patient Name: Date of Service: [facility]PP[facility] RD, [name] 04/13/2022 11:30 A M Medical Record Number: [id number] Patient Account Number: [id number] Date of Birth/Sex: Treating RN: Jun 20, 1940 (81 y.o. Drema Pry Primary Care Provider: Tommi Rumps Other Clinician: Referring Provider: Treating Provider/Extender: Conni Slipper in Treatment: 5 Information Obtained From Patient Respiratory Medical History: Positive for: Sleep Apnea - c-pap Cardiovascular Medical History: Positive for: Hypertension - takes medication Endocrine Medical History: Positive for: Type II Diabetes Time with diabetes: 15 years Treated with: Insulin Blood sugar tested every day: Yes Tested : 5 Genitourinary Medical History: Positive for: End Stage Renal Disease - Stage 4 kidney diseasse Musculoskeletal Medical  History: Positive for: Osteoarthritis - back, neck and joints Neurologic Medical History: Positive for: Neuropathy - feet Oncologic Medical History: Past Medical History Notes: Skin cancer-face, arms, hands Immunizations Pneumococcal Vaccine: Received Pneumococcal VaccinationREIGHLYNN, SWINEY ([id number]) [id number] Page 7 of 7 Received Pneumococcal Vaccination On or After 60th Birthday: Yes Tetanus Vaccine: Last tetanus shot: 05/09/2020 Implantable Devices None Family and Social History Never smoker; Marital Status - Widowed; Alcohol Use: Never; Drug Use: No History; Caffeine Use: Daily Electronic Signature(s) Signed: 04/13/2022 12:29:29 PM By: Kalman Shan DO Signed: 04/13/2022 4:17:24 PM By: Rosalio Loud MSN RN CNS WTA Entered By: Kalman Shan on 04/13/2022 12:28:51 -------------------------------------------------------------------------------- SuperBill Details Patient Name: Date of Service: SHEPPA RD, [place]. 04/13/2022 Medical Record Number: [id number] Patient Account Number: [id number] Date of Birth/Sex: Treating RN: 08-27-1940 (81 y.o. 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 Facility Procedures : CPT4 Code: [id number] Description: 66599 - WOUND CARE VISIT-LEV 2 EST PT Modifier: Quantity: 1 Physician Procedures : CPT4 Code Description Modifier [id number] - WC PHYS LEVEL 3 - EST PT ICD-10 Diagnosis Description T81.31XD Disruption of external  operation (surgical) wound, not elsewhere classified, subsequent encounter L97.818 Non-pressure chronic ulcer of other  part of right lower leg with other specified severity I87.311 Chronic venous hypertension (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

## 2022-04-14 NOTE — Progress Notes (Signed)
Kelly [name] ([id number]) [id number] Page 1 of 9 Visit Report for 04/13/2022 Arrival Information Details Patient Name: Date of Service: [address], [place] RGA RETTE [name]. 04/13/2022 11:30 A M Medical Record Number: [id number] Patient Account Number: [id number] Date of Birth/Sex: Treating RN: [birth year] (81 y.o. Kelly [name] Primary Care Aqueelah Cotrell: Tommi Rumps Other Clinician: Referring Aimar Shrewsbury: Treating Jody Silas/Extender: Conni Slipper in Treatment: 5 Visit Information History Since Last Visit Added or deleted any medications: No Patient Arrived: Ambulatory Any new allergies or adverse reactions: No Arrival Time: 12:11 Had a fall or experienced change in No Accompanied By: daughter activities of daily living that may affect Transfer Assistance: None risk of falls: Patient Identification Verified: Yes Hospitalized since last visit: No Secondary Verification Process Completed: Yes Pain Present Now: No Patient Requires Transmission-Based Precautions: No Patient Has Alerts: Yes Patient Alerts: ABI left 1.15 10/28/21 ABI right 1.13 10/28/21 Electronic Signature(s) Signed: 04/13/2022 4:17:24 PM By: Rosalio Loud MSN RN CNS WTA Entered By: Rosalio Loud on 04/13/2022 12:12:22 -------------------------------------------------------------------------------- Clinic Level of Care Assessment Details Patient Name: Date of Service: [facility] RD, [place] RGA RETTE [name]. 04/13/2022 11:30 A M Medical Record Number: [id number] Patient Account Number: [id number] Date of Birth/Sex: Treating RN: Feb 02, 1941 (81 y.o. Kelly [name] Primary Care Dolton Shaker: Tommi Rumps Other Clinician: Referring Natalie Mceuen: Treating Richa Shor/Extender: Conni Slipper in Treatment: 5 Clinic Level of Care Assessment Items TOOL 4 Quantity Score X- 1 0 Use when only an EandM is performed on FOLLOW-UP visit ASSESSMENTS - Nursing Assessment /  Reassessment X- 1 10 Reassessment of Co-morbidities (includes updates in patient status) X- 1 5 Reassessment of Adherence to Treatment Plan ASSESSMENTS - Wound and Skin A ssessment / Reassessment X - Simple Wound Assessment / Reassessment - one wound 1 5 Kelly [name] ([id number]) [id number] Page 2 of 9 '[]'$  - 0 Complex Wound Assessment / Reassessment - multiple wounds '[]'$  - 0 Dermatologic / Skin Assessment (not related to wound area) ASSESSMENTS - Focused Assessment '[]'$  - 0 Circumferential Edema Measurements - multi extremities '[]'$  - 0 Nutritional Assessment / Counseling / Intervention '[]'$  - 0 Lower Extremity Assessment (monofilament, tuning fork, pulses) '[]'$  - 0 Peripheral Arterial Disease Assessment (using hand held doppler) ASSESSMENTS - Ostomy and/or Continence Assessment and Care '[]'$  - 0 Incontinence Assessment and Management '[]'$  - 0 Ostomy Care Assessment and Management (repouching, etc.) PROCESS - Coordination of Care X - Simple Patient / Family Education for ongoing care 1 15 '[]'$  - 0 Complex (extensive) Patient / Family Education for ongoing care X- 1 10 Staff obtains Programmer, systems, Records, T Results / Process Orders est '[]'$  - 0 Staff telephones HHA, Nursing Homes / Clarify orders / etc '[]'$  - 0 Routine Transfer to another Facility (non-emergent condition) '[]'$  - 0 Routine Hospital Admission (non-emergent condition) '[]'$  - 0 New Admissions / Biomedical engineer / Ordering NPWT Apligraf, etc. , '[]'$  - 0 Emergency Hospital Admission (emergent condition) X- 1 10 Simple Discharge Coordination '[]'$  - 0 Complex (extensive) Discharge Coordination PROCESS - Special Needs '[]'$  - 0 Pediatric / Minor Patient Management '[]'$  - 0 Isolation Patient Management '[]'$  - 0 Hearing / Language / Visual special needs '[]'$  - 0 Assessment of Community assistance (transportation, D/C planning, etc.) '[]'$  - 0 Additional assistance / Altered mentation '[]'$  - 0 Support  Surface(s) Assessment (bed, cushion, seat, etc.) INTERVENTIONS - Wound Cleansing / Measurement X - Simple Wound Cleansing - one wound 1 5 '[]'$  - 0 Complex Wound Cleansing - multiple wounds '[]'$  - 0  Wound Imaging (photographs - any number of wounds) '[]'$  - 0 Wound Tracing (instead of photographs) '[]'$  - 0 Simple Wound Measurement - one wound '[]'$  - 0 Complex Wound Measurement - multiple wounds INTERVENTIONS - Wound Dressings X - Small Wound Dressing one or multiple wounds 1 10 '[]'$  - 0 Medium Wound Dressing one or multiple wounds '[]'$  - 0 Large Wound Dressing one or multiple wounds '[]'$  - 0 Application of Medications - topical '[]'$  - 0 Application of Medications - injection INTERVENTIONS - Miscellaneous '[]'$  - 0 External ear exam '[]'$  - 0 Specimen Collection (cultures, biopsies, blood, body fluids, etc.) '[]'$  - 0 Specimen(s) / Culture(s) sent or taken to Lab for analysis CASIA, CORTI ([id number]) [phone].pdf Page 3 of 9 '[]'$  - 0 Patient Transfer (multiple staff / Civil Service fast streamer / Similar devices) '[]'$  - 0 Simple Staple / Suture removal (25 or less) '[]'$  - 0 Complex Staple / Suture removal (26 or more) '[]'$  - 0 Hypo / Hyperglycemic Management (close monitor of Blood Glucose) '[]'$  - 0 Ankle / Brachial Index (ABI) - do not check if billed separately X- 1 5 Vital Signs Has the patient been seen at the hospital within the last three years: Yes Total Score: 75 Level Of Care: New/Established - Level 2 Electronic Signature(s) Signed: 04/13/2022 4:17:24 PM By: Rosalio Loud MSN RN CNS WTA Entered By: Rosalio Loud on 04/13/2022 12:26:16 -------------------------------------------------------------------------------- Encounter Discharge Information Details Patient Name: Date of Service: [address], [place]. 04/13/2022 11:30 A M Medical Record Number: [id number] Patient Account Number: [id number] Date of Birth/Sex: Treating RN: Aug 14, 1940 (81 y.o. Kelly [name] Primary Care  Kelly [name]: Tommi Rumps Other Clinician: Referring Darlean Warmoth: Treating Kelly [name]/Extender: Conni Slipper in Treatment: 5 Encounter Discharge Information Items Discharge Condition: Stable Ambulatory Status: Ambulatory Discharge Destination: Home Transportation: Private Auto Accompanied By: daughter Schedule Follow-up Appointment: Yes Clinical Summary of Care: Electronic Signature(s) Signed: 04/13/2022 2:03:46 PM By: Rosalio Loud MSN RN CNS WTA Entered By: Rosalio Loud on 04/13/2022 14:03:46 -------------------------------------------------------------------------------- Lower Extremity Assessment Details Patient Name: Date of Service: [address], [name]. 04/13/2022 11:30 A M Medical Record Number: [id number] Patient Account Number: [id number] Date of Birth/Sex: Treating RN: 22-May-1940 (81 y.o. Kelly [name] Primary Care Gayla Benn: Tommi Rumps Other Clinician: [name] ([id number]) [id number] Page 4 of 9 Referring Adelee Hannula: Treating Lawrance Wiedemann/Extender: Conni Slipper in Treatment: 5 Edema Assessment Assessed: [Left: No] [Right: No] [Left: Edema] [Right: :] Calf Left: Right: Point of Measurement: From Medial Instep 29.6 cm Ankle Left: Right: Point of Measurement: From Medial Instep 19.5 cm Vascular Assessment Pulses: Dorsalis Pedis Palpable: [Right:Yes] Electronic Signature(s) Signed: 04/13/2022 2:00:38 PM By: Rosalio Loud MSN RN CNS WTA Entered By: Rosalio Loud on 04/13/2022 14:00:38 -------------------------------------------------------------------------------- Multi Wound Chart Details Patient Name: Date of Service: [address], [place] RET[name]. 04/13/2022 11:30 A M Medical Record Number: [id number] Patient Account Number: [id number] Date of Birth/Sex: Treating RN: 27-Mar-1941 (81 y.o. Kelly [name] Primary Care Cassity Christian: Tommi Rumps Other Clinician: Referring  Blessyn [place]: Treating Morrison Mcbryar/Extender: Conni Slipper in Treatment: 5 Vital Signs Height(in): 63 Pulse(bpm): 78 Weight(lbs): 120 Blood Pressure(mmHg): 187/79 Body Mass Index(BMI): 21.3 Temperature(F): 98.0 Respiratory Rate(breaths/min): 18 [2:Photos:] [N/A:N/A] Right, Anterior Lower Leg N/A N/A Wound Location: Surgical Injury N/A N/A Wounding Event: Open Surgical Wound N/A N/A Primary Etiology: Sleep Apnea, Hypertension, Type II N/A N/A Comorbid History: Diabetes, End Stage Renal Disease, Osteoarthritis, Neuropathy [place], Tanea [name] ([id number]) [id number] Page 5 of 9 02/23/2022 N/A  N/A Date Acquired: 5 N/A N/A Weeks of Treatment: Open N/A N/A Wound Status: No N/A N/A Wound Recurrence: 1.4x0.5x0.2 N/A N/A Measurements L x W x D (cm) 0.55 N/A N/A A (cm) : rea 0.11 N/A N/A Volume (cm) : 41.60% N/A N/A % Reduction in Area: 41.50% N/A N/A % Reduction in Volume: Full Thickness Without Exposed N/A N/A Classification: Support Structures Medium N/A N/A Exudate Amount: Serosanguineous N/A N/A Exudate Type: red, brown N/A N/A Exudate Color: Medium (34-66%) N/A N/A Granulation Amount: Pink N/A N/A Granulation Quality: Medium (34-66%) N/A N/A Necrotic Amount: Fat Layer (Subcutaneous Tissue): Yes N/A N/A Exposed Structures: Fascia: No Tendon: No Muscle: No Joint: No Bone: No None N/A N/A Epithelialization: Treatment Notes Electronic Signature(s) Signed: 04/13/2022 4:17:24 PM By: Rosalio Loud MSN RN CNS WTA Entered By: Rosalio Loud on 04/13/2022 12:22:07 -------------------------------------------------------------------------------- Multi-Disciplinary Care Plan Details Patient Name: Date of Service: [facility] RD, [place] RET[name]. 04/13/2022 11:30 A M Medical Record Number: [id number] Patient Account Number: [id number] Date of Birth/Sex: Treating RN: 21-Nov-1940 (81 y.o. Kelly [name] Primary Care  Champagne Paletta: Tommi Rumps Other Clinician: Referring Pennye Beeghly: Treating Amayra Kiedrowski/Extender: Conni Slipper in Treatment: 5 Active Inactive Nutrition Nursing Diagnoses: Impaired glucose control: actual or potential Goals: Patient/caregiver will maintain therapeutic glucose control Date Initiated: 03/09/2022 Target Resolution Date: 04/08/2022 Goal Status: Active Interventions: Assess HgA1c results as ordered upon admission and as needed Assess patient nutrition upon admission and as needed per policy Notes: Wound/Skin Impairment Nursing Diagnoses: Knowledge deficit related to ulceration/compromised skin integrity HEDDY, VIDANA ([id number]) [phone].pdf Page 6 of 9 Goals: Patient/caregiver will verbalize understanding of skin care regimen Date Initiated: 03/09/2022 Target Resolution Date: 04/08/2022 Goal Status: Active Ulcer/skin breakdown will have a volume reduction of 30% by week 4 Date Initiated: 03/09/2022 Target Resolution Date: 05/09/2022 Goal Status: Active Ulcer/skin breakdown will have a volume reduction of 50% by week 8 Date Initiated: 03/09/2022 Target Resolution Date: 06/09/2022 Goal Status: Active Ulcer/skin breakdown will have a volume reduction of 80% by week 12 Date Initiated: 03/09/2022 Target Resolution Date: 07/08/2022 Goal Status: Active Ulcer/skin breakdown will heal within 14 weeks Date Initiated: 03/09/2022 Target Resolution Date: 08/09/2022 Goal Status: Active Interventions: Assess patient/caregiver ability to obtain necessary supplies Assess patient/caregiver ability to perform ulcer/skin care regimen upon admission and as needed Assess ulceration(s) every visit Notes: Electronic Signature(s) Signed: 04/13/2022 4:17:24 PM By: Rosalio Loud MSN RN CNS WTA Entered By: Rosalio Loud on 04/13/2022 12:33:52 -------------------------------------------------------------------------------- Pain Assessment  Details Patient Name: Date of Service: [address], [name]. 04/13/2022 11:30 A M Medical Record Number: [id number] Patient Account Number: [id number] Date of Birth/Sex: Treating RN: 11/30/1940 (81 y.o. Kelly [name] Primary Care Tayt Moyers: Tommi Rumps Other Clinician: Referring Manning Luna: Treating Junie Engram/Extender: Conni Slipper in Treatment: 5 Active Problems Location of Pain Severity and Description of Pain Patient Has Paino No Site Locations Pain Management and Medication ZARIYA, MINNER ([id number]) [phone].pdf Page 7 of 9 Current Pain Management: Electronic Signature(s) Signed: 04/13/2022 4:17:24 PM By: Rosalio Loud MSN RN CNS WTA Entered By: Rosalio Loud on 04/13/2022 12:15:24 -------------------------------------------------------------------------------- Patient/Caregiver Education Details Patient Name: Date of Service: [address] RD, [place] Darnell Level 12/6/2023andnbsp11:30 [place] Record Number: [id number] Patient Account Number: [id number] Date of Birth/Gender: Treating RN: [birth year] (81 y.o. Kelly [name] Primary Care Physician: Tommi Rumps Other Clinician: Referring Physician: Treating Physician/Extender: Conni Slipper in Treatment: 5 Education Assessment Education Provided To: Patient Education Topics Provided Wound/Skin Impairment: Handouts: Caring for  Your Ulcer Methods: Explain/Verbal Responses: State content correctly Electronic Signature(s) Signed: 04/13/2022 4:17:24 PM By: Rosalio Loud MSN RN CNS WTA Entered By: Rosalio Loud on 04/13/2022 12:33:48 -------------------------------------------------------------------------------- Wound Assessment Details Patient Name: Date of Service: [address], [name]. 04/13/2022 11:30 A M Medical Record Number: [id number] Patient Account Number: [id number] Date of Birth/Sex: Treating RN: 1941/01/22 (81 y.o. Kelly [name] Primary Care Delorese Sellin: Tommi Rumps Other Clinician: Referring Seibert Keeter: Treating Alyn Jurney/Extender: Conni Slipper in Treatment: 5 Wound Status Wound Number: 2 Primary Open Surgical Wound Etiology: Wound Location: Right, Anterior Lower Leg Wound Open TORRIE, NAMBA ([id number]) [id number] Page 8 of 9 Wound Open Wounding Event: Surgical Injury Status: Date Acquired: 02/23/2022 Comorbid Sleep Apnea, Hypertension, Type II Diabetes, End Stage Renal Weeks Of Treatment: 5 History: Disease, Osteoarthritis, Neuropathy Clustered Wound: No Photos Wound Measurements Length: (cm) 1.4 Width: (cm) 0.5 Depth: (cm) 0.2 Area: (cm) 0.55 Volume: (cm) 0.11 % Reduction in Area: 41.6% % Reduction in Volume: 41.5% Epithelialization: None Wound Description Classification: Full Thickness Without Exposed Support Exudate Amount: Medium Exudate Type: Serosanguineous Exudate Color: red, brown Structures Foul Odor After Cleansing: No Slough/Fibrino Yes Wound Bed Granulation Amount: Medium (34-66%) Exposed Structure Granulation Quality: Pink Fascia Exposed: No Necrotic Amount: Medium (34-66%) Fat Layer (Subcutaneous Tissue) Exposed: Yes Necrotic Quality: Adherent Slough Tendon Exposed: No Muscle Exposed: No Joint Exposed: No Bone Exposed: No Treatment Notes Wound #2 (Lower Leg) Wound Laterality: Right, Anterior Cleanser Soap and Water Discharge Instruction: Gently cleanse wound with antibacterial soap, rinse and pat dry prior to dressing wounds Peri-Wound Care Topical Activon Honey Gel, 25 ([name]) Tube Primary Dressing Hydrofera Blue Ready Transfer Foam, 2.5x2.5 (in/in) Discharge Instruction: Apply Hydrofera Blue Ready to wound bed as directed Secondary Dressing (BORDER) Zetuvit Plus SILICONE BORDER Dressing 4x4 (in/in) Discharge Instruction: Please do not put silicone bordered dressings under wraps. Use non-bordered  dressing only. Secured With Tubigrip Size D, 3x10 (in/yd) Compression Wrap Compression Stockings Add-Ons Electronic Signature(s) Signed: 04/13/2022 4:17:24 PM By: Rosalio Loud MSN RN CNS WTA Entered By: Rosalio Loud on 04/13/2022 12:21:10 [name] ([id number]) [id number] Page 9 of 9 -------------------------------------------------------------------------------- Vitals Details Patient Name: Date of Service: [facility] RD, [place] RGA RETTE [name]. 04/13/2022 11:30 A M Medical Record Number: [id number] Patient Account Number: [id number] Date of Birth/Sex: Treating RN: Oct 18, 1940 (81 y.o. Kelly [name] Primary Care Lundon Verdejo: Tommi Rumps Other Clinician: Referring Antoinett Dorman: Treating Valleri Hendricksen/Extender: Conni Slipper in Treatment: 5 Vital Signs Time Taken: 12:12 Temperature (F): 98.0 Height (in): 63 Pulse (bpm): 66 Weight (lbs): 120 Respiratory Rate (breaths/min): 18 Body Mass Index (BMI): 21.3 Blood Pressure (mmHg): 187/79 Reference Range: 80 - 120 mg / dl Electronic Signature(s) Signed: 04/13/2022 4:17:24 PM By: Rosalio Loud MSN RN CNS WTA Entered By: Rosalio Loud on 04/13/2022 12:15:17

## 2022-04-15 ENCOUNTER — Telehealth: Payer: Self-pay

## 2022-04-15 NOTE — Telephone Encounter (Signed)
Patient's daughter, Srah Ake, called to state she has questions regarding dosage and possible side effects.  Marcie Bal states patient is fine right now but was throwing up last night.

## 2022-04-18 ENCOUNTER — Other Ambulatory Visit: Payer: Self-pay

## 2022-04-18 NOTE — Telephone Encounter (Signed)
Patient is concerned of her short term memory loss could the atorvastatin cause this. She does not vomit daily but when she does it does not stop.  The daughter wants it reduced or stopped she wants you to look into her memory loss as a side effect.  Elenie Coven,cma

## 2022-04-18 NOTE — Telephone Encounter (Signed)
I called and spoke with the pateints daughter and she stated her mother has been throwing up green bile.  This is her only symptoms and she wonders if it is from the atorvastatin, she stated the mother thinks she should be on 10 mg but the pharmacy refilled for 80 mg and she thinks it is contributing to her vomiting.  Please advise.  Keyarah Mcroy,cma

## 2022-04-18 NOTE — Telephone Encounter (Signed)
The patient has been on atorvastatin 80 mg daily since 2020.  I do not think that that would be causing her vomiting.  If she continues to have issues with vomiting we should reevaluate in the office or if she has uncontrolled vomiting she should be seen right away either here or urgent care depending on availability.

## 2022-04-19 NOTE — Telephone Encounter (Signed)
There is not any good data to indicate that statins contribute to memory loss. Though we could always try stopping it for a few weeks to see if it makes a difference with the memory and vomiting. If there is not any difference with being off the statin she should go right back on it. If there is a difference they need to let me know so I can figure out the next step for her cholesterol. It is also possible that the victoza could be contributing to the vomiting. If that does not improve with stopping the statin then we may need to consider switching the victoza to something different.

## 2022-04-20 NOTE — Telephone Encounter (Signed)
Pt has been informed and states she will try being off of the statin for 2 weeks and let us know any changes.

## 2022-04-26 ENCOUNTER — Emergency Department
Admission: EM | Admit: 2022-04-26 | Discharge: 2022-04-27 | Disposition: A | Discharge status: Home / Self Care | Payer: Medicare Other | Attending: Emergency Medicine | Admitting: Emergency Medicine

## 2022-04-26 ENCOUNTER — Other Ambulatory Visit: Payer: Self-pay

## 2022-04-26 DIAGNOSIS — N184 Chronic kidney disease, stage 4 (severe): Secondary | ICD-10-CM | POA: Diagnosis not present

## 2022-04-26 DIAGNOSIS — E1165 Type 2 diabetes mellitus with hyperglycemia: Secondary | ICD-10-CM

## 2022-04-26 DIAGNOSIS — E1122 Type 2 diabetes mellitus with diabetic chronic kidney disease: Secondary | ICD-10-CM | POA: Insufficient documentation

## 2022-04-26 DIAGNOSIS — R112 Nausea with vomiting, unspecified: Secondary | ICD-10-CM

## 2022-04-26 DIAGNOSIS — I129 Hypertensive chronic kidney disease with stage 1 through stage 4 chronic kidney disease, or unspecified chronic kidney disease: Secondary | ICD-10-CM | POA: Insufficient documentation

## 2022-04-26 DIAGNOSIS — Z20822 Contact with and (suspected) exposure to covid-19: Secondary | ICD-10-CM | POA: Insufficient documentation

## 2022-04-26 DIAGNOSIS — N189 Chronic kidney disease, unspecified: Secondary | ICD-10-CM | POA: Diagnosis not present

## 2022-04-26 LAB — CBC WITH DIFFERENTIAL/PLATELET
Abs Immature Granulocytes: 0.02 10*3/uL (ref 0.00–0.07)
Basophils Absolute: 0.1 10*3/uL (ref 0.0–0.1)
Basophils Relative: 1 %
Eosinophils Absolute: 0 10*3/uL (ref 0.0–0.5)
Eosinophils Relative: 0 %
HCT: 46.3 % — ABNORMAL HIGH (ref 36.0–46.0)
Hemoglobin: 14.7 g/dL (ref 12.0–15.0)
Immature Granulocytes: 0 %
Lymphocytes Relative: 15 %
Lymphs Abs: 1.2 10*3/uL (ref 0.7–4.0)
MCH: 30.2 pg (ref 26.0–34.0)
MCHC: 31.7 g/dL (ref 30.0–36.0)
MCV: 95.3 fL (ref 80.0–100.0)
Monocytes Absolute: 0.4 10*3/uL (ref 0.1–1.0)
Monocytes Relative: 5 %
Neutro Abs: 6.2 10*3/uL (ref 1.7–7.7)
Neutrophils Relative %: 79 %
Platelets: 194 10*3/uL (ref 150–400)
RBC: 4.86 MIL/uL (ref 3.87–5.11)
RDW: 12.9 % (ref 11.5–15.5)
WBC: 7.9 10*3/uL (ref 4.0–10.5)
nRBC: 0 % (ref 0.0–0.2)

## 2022-04-26 LAB — COMPREHENSIVE METABOLIC PANEL
ALT: 20 U/L (ref 0–44)
AST: 27 U/L (ref 15–41)
Albumin: 3.8 g/dL (ref 3.5–5.0)
Alkaline Phosphatase: 71 U/L (ref 38–126)
Anion gap: 8 (ref 5–15)
BUN: 26 mg/dL — ABNORMAL HIGH (ref 8–23)
CO2: 29 mmol/L (ref 22–32)
Calcium: 9.3 mg/dL (ref 8.9–10.3)
Chloride: 105 mmol/L (ref 98–111)
Creatinine, Ser: 1.77 mg/dL — ABNORMAL HIGH (ref 0.44–1.00)
GFR, Estimated: 29 mL/min — ABNORMAL LOW (ref >60)
Glucose, Bld: 89 mg/dL (ref 70–99)
Potassium: 3.7 mmol/L (ref 3.5–5.1)
Sodium: 142 mmol/L (ref 135–145)
Total Bilirubin: 0.8 mg/dL (ref 0.3–1.2)
Total Protein: 7.2 g/dL (ref 6.5–8.1)

## 2022-04-26 LAB — URINALYSIS, ROUTINE W REFLEX MICROSCOPIC
Bilirubin Urine: NEGATIVE
Glucose, UA: NEGATIVE mg/dL
Hgb urine dipstick: NEGATIVE
Ketones, ur: NEGATIVE mg/dL
Nitrite: NEGATIVE
Protein, ur: >=300 mg/dL — AB
Specific Gravity, Urine: 1.019 (ref 1.005–1.030)
pH: 5 (ref 5.0–8.0)

## 2022-04-26 LAB — TROPONIN I (HIGH SENSITIVITY)
Troponin I (High Sensitivity): 8 ng/L (ref <18)
Troponin I (High Sensitivity): 9 ng/L (ref <18)

## 2022-04-26 LAB — CBG MONITORING, ED
Glucose-Capillary: 66 mg/dL — ABNORMAL LOW (ref 70–99)
Glucose-Capillary: 99 mg/dL (ref 70–99)
Glucose-Capillary: 120 mg/dL — ABNORMAL HIGH (ref 70–99)

## 2022-04-26 MED ORDER — ONDANSETRON 4 MG PO TBDP
4.0000 mg | ORAL_TABLET | Freq: Once | ORAL | Status: AC
  Administered 2022-04-26: 4 mg via ORAL
  Filled 2022-04-26: qty 1

## 2022-04-26 MED ORDER — LACTATED RINGERS IV BOLUS
1000.0000 mL | Freq: Once | INTRAVENOUS | Status: AC
  Administered 2022-04-26: 1000 mL via INTRAVENOUS

## 2022-04-26 MED ORDER — DIPHENHYDRAMINE HCL 50 MG/ML IJ SOLN
12.5000 mg | Freq: Once | INTRAMUSCULAR | Status: AC
  Administered 2022-04-26: 12.5 mg via INTRAVENOUS
  Filled 2022-04-26: qty 1

## 2022-04-26 MED ORDER — METOCLOPRAMIDE HCL 5 MG/ML IJ SOLN
10.0000 mg | Freq: Once | INTRAMUSCULAR | Status: AC
  Administered 2022-04-26: 10 mg via INTRAVENOUS
  Filled 2022-04-26: qty 2

## 2022-04-26 NOTE — ED Notes (Signed)
Daughter to desk, inquiring over wait time, st "she needs fluids"; Pt sitting in lobby with no distress noted, drinking a soda; updated on delay

## 2022-04-26 NOTE — ED Triage Notes (Signed)
Reports when she got up this morning and has not taken her insulin and glucose was 350 and then an hour after taking insulin she vomited and her glucose started dropping.  Reports at one point it was 80 and she is nauseated and scared to eat anything.

## 2022-04-26 NOTE — ED Notes (Signed)
Patient reports a change in her long acting insulin from 6 to 8  Reports intermittent vomiting since change in medicaion.

## 2022-04-26 NOTE — ED Provider Triage Note (Signed)
Emergency Medicine Provider Triage Evaluation Note  Kelly [name] , a 81 y.o. female  was evaluated in triage.  Pt complains of lightheaded. No pain. Reports awoke with elevated blood sugar to 350 after she ate a lot of jello this morning that was not sugar free.Then she took the insulin and it was 100 in 1 hour. Patient has been nauseous and not wanting to eat so daughter brought her here. Recent increase in basaglar 1 week ago. Has had n/v since Thursday.   Blood sugar in triage 66. Given juice  Review of Systems  Positive: lightheaded Negative: pain  Physical Exam  There were no vitals taken for this visit. Gen:   Awake, no distress   Resp:  Normal effort  MSK:   Moves extremities without difficulty  Other:    Medical Decision Making  Medically screening exam initiated at 6:15 PM.  Appropriate orders placed.  Kelly [name] was informed that the remainder of the evaluation will be completed by another provider, this initial triage assessment does not replace that evaluation, and the importance of remaining in the ED until their evaluation is complete.     Marquette Old, PA-C 04/26/22 1825

## 2022-04-26 NOTE — ED Notes (Signed)
Pt brought to ed rm 11H at this time, this RN now assuming care.

## 2022-04-26 NOTE — ED Provider Notes (Signed)
[facility] Provider Note    Event Date/Time   First MD Initiated Contact with Patient 04/26/22 2259     (approximate)   History   Chief Complaint: Emesis   HPI  Kelly [name] is a 81 y.o. female with a history of diabetes, hypertension, CKD who comes the ED complaining of vomiting for the past 5 days, waxing and waning, worse again this evening around 5:00 PM.  She also noticed this morning her blood sugar was 350, had after taking her insulins this evening, her blood sugar dropped to 59.  She denies any pain.  She has had normal bowel movements, no diarrhea.  No fevers or chills or sick contacts.  She did recently have an out-of-town trip to [place] with her boyfriend.  No chest pain, no dizziness or syncope.     Physical Exam   Triage Vital Signs: ED Triage Vitals  Enc Vitals Group     BP 04/26/22 1816 (!) 169/73     Pulse Rate 04/26/22 1816 79     Resp 04/26/22 1816 16     Temp 04/26/22 1816 98.1 F (36.7 C)     Temp Source 04/26/22 1816 Oral     SpO2 04/26/22 1816 97 %     Weight 04/26/22 1818 123 lb (55.8 kg)     Height 04/26/22 1818 '5\' 3"'$  (1.6 m)     Head Circumference --      Peak Flow --      Pain Score 04/26/22 1818 0     Pain Loc --      Pain Edu? --      Excl. in [place]? --     Most recent vital signs: Vitals:   04/26/22 2122 04/26/22 2342  BP: (!) 153/112 (!) 152/84  Pulse: 89 90  Resp: 16 16  Temp: 97.7 F (36.5 C)   SpO2: 96% 97%    General: Awake, no distress.  CV:  Good peripheral perfusion.  Regular rate and rhythm Resp:  Normal effort.  Clear to auscultation bilaterally Abd:  No distention.  Soft, nontender.  No peritoneal signs. Other:  Moving all extremities, normal coordination   ED Results / Procedures / Treatments   Labs (all labs ordered are listed, but only abnormal results are displayed) Labs Reviewed  COMPREHENSIVE METABOLIC PANEL - Abnormal; Notable for the following components:       Result Value   BUN 26 (*)    Creatinine, Ser 1.77 (*)    GFR, Estimated 29 (*)    All other components within normal limits  CBC WITH DIFFERENTIAL/PLATELET - Abnormal; Notable for the following components:   HCT 46.3 (*)    All other components within normal limits  URINALYSIS, ROUTINE W REFLEX MICROSCOPIC - Abnormal; Notable for the following components:   Color, Urine YELLOW (*)    APPearance HAZY (*)    Protein, ur >=300 (*)    Leukocytes,Ua TRACE (*)    Bacteria, UA RARE (*)    All other components within normal limits  CBG MONITORING, ED - Abnormal; Notable for the following components:   Glucose-Capillary 66 (*)    All other components within normal limits  CBG MONITORING, ED - Abnormal; Notable for the following components:   Glucose-Capillary 120 (*)    All other components within normal limits  RESP PANEL BY RT-PCR (RSV, FLU A&B, COVID)  RVPGX2  CBG MONITORING, ED  TROPONIN I (HIGH SENSITIVITY)  TROPONIN I (HIGH SENSITIVITY)  EKG Interpreted by me Leverne Humbles sinus rhythm, rate of 73.  Normal axis, normal intervals.  Poor R wave progression.  Normal ST segments and T waves.   RADIOLOGY    PROCEDURES:  Procedures   MEDICATIONS ORDERED IN ED: Medications  ondansetron (ZOFRAN-ODT) disintegrating tablet 4 mg (4 mg Oral Given 04/26/22 1829)  metoCLOPramide (REGLAN) injection 10 mg (10 mg Intravenous Given 04/26/22 2341)  lactated ringers bolus 1,000 mL (0 mLs Intravenous Stopped 04/27/22 0048)  diphenhydrAMINE (BENADRYL) injection 12.5 mg (12.5 mg Intravenous Given 04/26/22 2341)     IMPRESSION / MDM / ASSESSMENT AND PLAN / ED COURSE  I reviewed the triage vital signs and the nursing notes.                              Differential diagnosis includes, but is not limited to, dehydration, AKI, electrolyte abnormality, UTI, DKA, hypoglycemia, viral illness.  Doubt ACS PE dissection or carditis.  Abdomen is benign  Patient's presentation is most consistent with  acute presentation with potential threat to life or bodily function.  Patient presents with isolated vomiting along with labile blood sugars today.  Currently the ED blood sugar is normal.  She is still vomiting after receiving oral Zofran.  Will give IV antiemetics, IV fluids.  With her abdominal exam being completely benign, I do not think CT imaging is warranted as long as symptoms improve and she can tolerate oral intake.   Clinical Course as of 04/27/22 0120  Wed Apr 27, 2022  0054 Pt feeling much better. Will PO trial [PS]    Clinical Course User Index [PS] Carrie Mew, MD    ----------------------------------------- 1:21 AM on 04/27/2022 ----------------------------------------- Tolerated PO very well and still feels much better.    FINAL CLINICAL IMPRESSION(S) / ED DIAGNOSES   Final diagnoses:  Nausea and vomiting, unspecified vomiting type  Type 2 diabetes mellitus with hyperglycemia, with long-term current use of insulin (HCC)  Stage 4 chronic kidney disease ([place])     Rx / DC Orders   ED Discharge Orders          Ordered    metoCLOPramide (REGLAN) 10 MG tablet  Every 6 hours PRN        04/27/22 0053             Note:  This document was prepared using Dragon voice recognition software and may include unintentional dictation errors.   Carrie Mew, MD 04/27/22 [phone]

## 2022-04-26 NOTE — ED Notes (Signed)
ED Provider at bedside. 

## 2022-04-27 ENCOUNTER — Encounter (HOSPITAL_BASED_OUTPATIENT_CLINIC_OR_DEPARTMENT_OTHER): Payer: Medicare Other | Admitting: Internal Medicine

## 2022-04-27 DIAGNOSIS — I872 Venous insufficiency (chronic) (peripheral): Secondary | ICD-10-CM | POA: Diagnosis not present

## 2022-04-27 DIAGNOSIS — E1151 Type 2 diabetes mellitus with diabetic peripheral angiopathy without gangrene: Secondary | ICD-10-CM | POA: Diagnosis not present

## 2022-04-27 DIAGNOSIS — I87311 Chronic venous hypertension (idiopathic) with ulcer of right lower extremity: Secondary | ICD-10-CM

## 2022-04-27 DIAGNOSIS — L97818 Non-pressure chronic ulcer of other part of right lower leg with other specified severity: Secondary | ICD-10-CM

## 2022-04-27 DIAGNOSIS — T8131XD Disruption of external operation (surgical) wound, not elsewhere classified, subsequent encounter: Secondary | ICD-10-CM | POA: Diagnosis not present

## 2022-04-27 DIAGNOSIS — E11622 Type 2 diabetes mellitus with other skin ulcer: Secondary | ICD-10-CM | POA: Diagnosis not present

## 2022-04-27 LAB — RESP PANEL BY RT-PCR (RSV, FLU A&B, COVID)  RVPGX2
Influenza A by PCR: NEGATIVE
Influenza B by PCR: NEGATIVE
Resp Syncytial Virus by PCR: NEGATIVE
SARS Coronavirus 2 by RT PCR: NEGATIVE

## 2022-04-27 MED ORDER — METOCLOPRAMIDE HCL 10 MG PO TABS: 10.0000 mg | ORAL_TABLET | Freq: Four times a day (QID) | ORAL | 0 refills | Status: DC | PRN

## 2022-04-27 NOTE — ED Notes (Signed)
Signing pad not available. Pt verbalized understanding of DC instructions.

## 2022-04-27 NOTE — ED Notes (Signed)
Pt sts she feels better after the meds and the fluids, MD notified. Per MD he wanted an PO challenge. Pt given water and crackers and informed to let this RN know of any vomiting.

## 2022-04-27 NOTE — ED Notes (Signed)
Pt ate all the crackers and drank the water and sts that she still feels pretty good and would be good to go home. MD stafford notified.

## 2022-04-27 NOTE — Progress Notes (Signed)
Kelly [name] ([id number]) [id number] Page 1 of 6 Visit Report for 04/27/2022 Chief Complaint Document Details Patient Name: Date of Service: [facility] RD, [name] 04/27/2022 10:45 A M Medical Record Number: [id number] Patient Account Number: [id number] Date of Birth/Sex: Treating RN: 03-02-1941 (81 y.o. Kelly [name] Primary Care Provider: Tommi [name] Other Clinician: Referring Provider: Treating Provider/Extender: Kelly [name] in Treatment: 7 Information Obtained from: Patient Chief Complaint Right LE Ulcer 03/09/2022; patient is here for review of a wound on the right anterior mid tibia which is a surgical wound Electronic Signature(s) Signed: 04/27/2022 11:30:30 AM By: Kelly Shan DO Entered By: Kelly [name] on 04/27/2022 11:21:52 -------------------------------------------------------------------------------- HPI Details Patient Name: Date of Service: Kelly RD, [place] RGA [name] 04/27/2022 10:45 A M Medical Record Number: [id number] Patient Account Number: [id number] Date of Birth/Sex: Treating RN: [birth year] (81 y.o. Kelly [name] Primary Care Provider: Tommi [name] Other Clinician: Referring Provider: Treating Provider/Extender: Kelly [name] in Treatment: 7 History of Present Illness HPI Description: 10-28-2021 upon evaluation today patient appears to be doing somewhat poorly in regard to a wound on her right leg. This happened when she had a concrete birdbath fall and hit her leg on May 16. She went to urgent care 2 days later since that time she has been on 2 rounds of doxycycline as well as a round of clindamycin the good news is I see no evidence of infection at this point. Fortunately there is no signs of active infection locally or systemically which is great news and overall I think you are on the right track as far as healing is concerned I do believe that  there are some things we can do to help her out here. The patient does have a history of diabetes mellitus type 2, hypertension, chronic kidney disease stage IV, and chronic venous insufficiency/hypertension. Her most recent hemoglobin A1c was 8.5 on 09-29-2021. 11-04-2021 upon evaluation today patient appears to be doing well currently in regard to her wound. She has been tolerating the dressing changes without complication. Fortunately I do not see any evidence of infection locally or systemically which is great news. 11-18-2021 upon evaluation today patient appears to be doing well currently in regard to her wound. She has been tolerating the dressing changes without complication. Fortunately there does not appear to be any evidence of active infection locally or systemically at this time which is great news. No fevers, chills, nausea, vomiting, or diarrhea. Kelly [name], Kelly [name] ([id number]) [id number] Page 2 of 6 READMISSION 03/09/2022 This is a 81 year old woman we actually had in the clinic for visits in June and July of this year. At this point she had a traumatic wound on the right anterior lower leg which ultimately healed. I did not see that she was actually discharged in a healed state from here that being said it did close over. Roughly 2 weeks ago she had a skin cancer removed by her dermatologist Dr. Evorn [name] from an area on the same leg just above the wound area that we were dealing with. It is sutured. She was seen in the urgent care a week ago because of pain and erythema and put on cephalexin for cellulitis although the patient tells me currently she is on doxycycline. She is using Vaseline gauze changing daily. She comes in today using the Vaseline gauze the area around the wound is somewhat macerated slight drainage from the center of this. She does not have a vascular issue her  ABIs in the right leg were normal when she was here during the  summer Patient is a type II diabetic, hypertension, chronic kidney disease stage IIIb 11/8; patient presents for follow-up. She states she had the sutures removed by her dermatologist yesterday. She is currently on Bactrim. She denies signs of infection today. She has been using silver alginate to the wound bed. 11/15; patient presents for follow-up. She has been doing Medihoney with Hydrofera Blue to the wound bed daily. There has been improvement in wound healing. We discussed potentially doing an in office wrap versus compression stockings to help facilitate wound healing. She would like to proceed with an office wrap. Her ABIs on the right was 1.13. 11/22; patient presents for follow-up. At last clinic visit antibiotic ointment with Hydrofera Blue under Kerlix/Coban was used. The next day the patient got the wrap wet and took it off. She has been doing Medihoney and Hydrofera Blue to the wound bed daily. She has no issues or complaints today. 11/29; patient presents for follow-up. She has been doing [employer] with Medihoney to the wound bed. She has not been using Tubigrip. 12/6; patient presents for follow-up. She has been using Hydrofera Blue and Medihoney to the wound bed. She has been using Tubigrip. She has no issues or complaints today. 12/20; patient presents for follow-up. She has been using Hydrofera Blue and Medihoney to the wound bed. This area has healed. She states today she scratched an area right beside it. Electronic Signature(s) Signed: 04/27/2022 11:30:30 AM By: Kelly Shan DO Entered By: Kelly [name] on 04/27/2022 11:23:24 -------------------------------------------------------------------------------- Physical Exam Details Patient Name: Date of Service: Kelly RD, [place] RGA [name] 04/27/2022 10:45 A M Medical Record Number: [id number] Patient Account Number: [id number] Date of Birth/Sex: Treating RN: [birth year] (81 y.o. Kelly [name] Primary Care Provider:  Tommi [name] Other Clinician: Referring Provider: Treating Provider/Extender: Kelly [name] in Treatment: 7 Constitutional . Cardiovascular . Psychiatric . Notes T the mid tibia there is a vertical scar from an incision site. There is a fresh layer of epithelization to the previous wound site. Right beside this is an area of o skin breakdown from wherever the patient scratched. No signs of surrounding infection. Venous stasis dermatitis. No significant swelling noted on exam. Electronic Signature(s) Signed: 04/27/2022 11:30:30 AM By: Kelly Shan DO Entered By: Kelly [name] on 04/27/2022 11:24:09 [name] ([id number]) [id number] Page 3 of 6 -------------------------------------------------------------------------------- Physician Orders Details Patient Name: Date of Service: [facility] RD, [name] 04/27/2022 10:45 A M Medical Record Number: [id number] Patient Account Number: [id number] Date of Birth/Sex: Treating RN: 15-Jul-1940 (81 y.o. Kelly [name] Primary Care Provider: Tommi [name] Other Clinician: Referring Provider: Treating Provider/Extender: Kelly [name] in Treatment: 7 Verbal / Phone Orders: No Diagnosis Coding Discharge From [facility] Discharge from [place] antibiotic ointment and band aid once a day for a week Follow-up Appointments Other: - Call if needed Electronic Signature(s) Signed: 04/27/2022 11:30:30 AM By: Kelly Shan DO Entered By: Kelly [name] on 04/27/2022 11:25:42 -------------------------------------------------------------------------------- Problem List Details Patient Name: Date of Service: Kelly RD, [place] RGA [name] 04/27/2022 10:45 A M Medical Record Number: [id number] Patient Account Number: [id number] Date of Birth/Sex: Treating RN: 10/25/1940 (81 y.o. Kelly [name] Primary Care  Provider: Tommi [name] Other Clinician: Referring Provider: Treating Provider/Extender: Kelly [name] in Treatment: 7 Active Problems ICD-10 Encounter Code Description Active Date MDM Diagnosis T81.31XD Disruption of  external operation (surgical) wound, not elsewhere classified, 03/09/2022 No Yes subsequent encounter L97.818 Non-pressure chronic ulcer of other part of right lower leg with other specified 03/09/2022 No Yes severity I87.311 Chronic venous hypertension (idiopathic) with ulcer of right lower extremity 04/06/2022 No Yes Kelly [name], Kelly [name] ([id number]) [id number] Page 4 of 6 Inactive Problems Resolved Problems Electronic Signature(s) Signed: 04/27/2022 11:30:30 AM By: Kelly Shan DO Entered By: Kelly [name] on 04/27/2022 11:21:49 -------------------------------------------------------------------------------- Progress Note Details Patient Name: Date of Service: Kelly RD, [place] RGA [name] 04/27/2022 10:45 A M Medical Record Number: [id number] Patient Account Number: [id number] Date of Birth/Sex: Treating RN: [birth year] (81 y.o. Kelly [name] Primary Care Provider: Tommi [name] Other Clinician: Referring Provider: Treating Provider/Extender: Kelly [name] in Treatment: 7 Subjective Chief Complaint Information obtained from Patient Right LE Ulcer 03/09/2022; patient is here for review of a wound on the right anterior mid tibia which is a surgical wound History of Present Illness (HPI) 10-28-2021 upon evaluation today patient appears to be doing somewhat poorly in regard to a wound on her right leg. This happened when she had a concrete birdbath fall and hit her leg on May 16. She went to urgent care 2 days later since that time she has been on 2 rounds of doxycycline as well as a round of clindamycin the good news is I see no evidence of infection at this point. Fortunately  there is no signs of active infection locally or systemically which is great news and overall I think you are on the right track as far as healing is concerned I do believe that there are some things we can do to help her out here. The patient does have a history of diabetes mellitus type 2, hypertension, chronic kidney disease stage IV, and chronic venous insufficiency/hypertension. Her most recent hemoglobin A1c was 8.5 on 09-29-2021. 11-04-2021 upon evaluation today patient appears to be doing well currently in regard to her wound. She has been tolerating the dressing changes without complication. Fortunately I do not see any evidence of infection locally or systemically which is great news. 11-18-2021 upon evaluation today patient appears to be doing well currently in regard to her wound. She has been tolerating the dressing changes without complication. Fortunately there does not appear to be any evidence of active infection locally or systemically at this time which is great news. No fevers, chills, nausea, vomiting, or diarrhea. READMISSION 03/09/2022 This is a 81 year old woman we actually had in the clinic for visits in June and July of this year. At this point she had a traumatic wound on the right anterior lower leg which ultimately healed. I did not see that she was actually discharged in a healed state from here that being said it did close over. Roughly 2 weeks ago she had a skin cancer removed by her dermatologist Dr. Evorn [name] from an area on the same leg just above the wound area that we were dealing with. It is sutured. She was seen in the urgent care a week ago because of pain and erythema and put on cephalexin for cellulitis although the patient tells me currently she is on doxycycline. She is using Vaseline gauze changing daily. She comes in today using the Vaseline gauze the area around the wound is somewhat macerated slight drainage from the center of this. She does not have  a vascular issue her ABIs in the right leg were normal when she was here during the summer Patient is a type II diabetic,  hypertension, chronic kidney disease stage IIIb 11/8; patient presents for follow-up. She states she had the sutures removed by her dermatologist yesterday. She is currently on Bactrim. She denies signs of infection today. She has been using silver alginate to the wound bed. 11/15; patient presents for follow-up. She has been doing Medihoney with Hydrofera Blue to the wound bed daily. There has been improvement in wound healing. We discussed potentially doing an in office wrap versus compression stockings to help facilitate wound healing. She would like to proceed with an office wrap. Her ABIs on the right was 1.13. 11/22; patient presents for follow-up. At last clinic visit antibiotic ointment with Hydrofera Blue under Kerlix/Coban was used. The next day the patient got the wrap wet and took it off. She has been doing Medihoney and Hydrofera Blue to the wound bed daily. She has no issues or complaints today. 11/29; patient presents for follow-up. She has been doing [employer] with Medihoney to the wound bed. She has not been using Tubigrip. 12/6; patient presents for follow-up. She has been using Hydrofera Blue and Medihoney to the wound bed. She has been using Tubigrip. She has no issues or Kelly [name], Kelly [name] ([id number]) [id number] Page 5 of 6 complaints today. 12/20; patient presents for follow-up. She has been using Hydrofera Blue and Medihoney to the wound bed. This area has healed. She states today she scratched an area right beside it. Objective Constitutional Vitals Time Taken: 10:57 AM, Height: 63 in, Weight: 120 lbs, BMI: 21.3, Temperature: 98.0 F, Pulse: 97 bpm, Respiratory Rate: 18 breaths/min, Blood Pressure: 131/69 mmHg. General Notes: T the mid tibia there is a vertical scar from an incision site. There is a fresh layer of  epithelization to the previous wound site. Right beside this o is an area of skin breakdown from wherever the patient scratched. No signs of surrounding infection. Venous stasis dermatitis. No significant swelling noted on exam. Integumentary (Hair, Skin) Wound #2 status is Healed - Epithelialized. Original cause of wound was Surgical Injury. The date acquired was: 02/23/2022. The wound has been in treatment 7 weeks. The wound is located on the Right,Anterior Lower Leg. The wound measures 0cm length x 0cm width x 0cm depth; 0cm^2 area and 0cm^3 volume. There is Fat Layer (Subcutaneous Tissue) exposed. There is a medium amount of serosanguineous drainage noted. There is medium (34-66%) pink granulation within the wound bed. There is a medium (34-66%) amount of necrotic tissue within the wound bed. 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 The area has healed. I recommended keeping the area protected with foam border dressings daily for the next 1 to 2 weeks. She has a little area of skin breakdown that she just recently developed after scratching. I recommended antibiotic ointment here. That should heal up in the next few days. She knows to call with any questions or concerns. She can follow-up as needed. Plan Discharge From [facility] Services: Discharge from [facility] Complete - Apply antibiotic ointment and band aid once a day for a week Follow-up Appointments: Other: - Call if needed 1. Discharge from clinic due to closed wound 2. Follow-up as needed Electronic Signature(s) Signed: 04/27/2022 11:30:30 AM By: Kelly Shan DO Entered By: Kelly [name] on  04/27/2022 11:25:23 Vanleer,  Milinda Pointer ([id number]) [id number] Page 6 of 6 -------------------------------------------------------------------------------- SuperBill Details Patient Name: Date of Service: [facility] RD, [name] 04/27/2022 Medical Record Number: [id number] Patient Account Number: [id number] Date of Birth/Sex: Treating RN: 1940/10/14 (81 y.o. Kelly [name] Primary Care Provider: Tommi [name] Other Clinician: Referring Provider: Treating Provider/Extender: Kelly [name] in Treatment: 7 Diagnosis Coding ICD-10 Codes Code Description T81.31XD Disruption of external operation (surgical) wound, not elsewhere classified, subsequent encounter L97.818 Non-pressure chronic ulcer of other part of right lower leg with other specified severity I87.311 Chronic venous hypertension (idiopathic) with ulcer of right lower extremity Facility Procedures : CPT4 Code: [id number] Description: 99213 - WOUND CARE VISIT-LEV 3 EST PT Modifier: Quantity: 1 Physician Procedures : CPT4 Code Description Modifier [id number] - WC PHYS LEVEL 3 - EST PT ICD-10 Diagnosis Description T81.31XD Disruption of external operation (surgical) wound, not elsewhere classified, subsequent encounter L97.818 Non-pressure chronic ulcer of other  part of right lower leg with other specified severity I87.311 Chronic venous hypertension (idiopathic) with ulcer of right lower extremity Quantity: 1 Electronic Signature(s) Signed: 04/27/2022 11:30:30 AM By: Kelly Shan DO Entered By: Kelly [name] on 04/27/2022 11:25:35

## 2022-04-28 ENCOUNTER — Telehealth: Payer: Self-pay | Admitting: Family Medicine

## 2022-04-28 NOTE — Telephone Encounter (Signed)
Pt daughter returning call

## 2022-04-28 NOTE — Telephone Encounter (Signed)
I called the daughters number and the operator stated the call could not be completed.  Hussam Muniz,cma

## 2022-04-28 NOTE — Telephone Encounter (Signed)
Patient's daughter called and would like a phone call back from [place]. Patient was in the ED and appointment was scheduled for 05/03/22. Daughter would like to speak to Gae Bon about Patient's medication ,insulin.Sugar level was 350, she ate jello with sugar, took her insulin, 8 units, started throwing up, sugar dropped to  59. because she was throwing up daughter took her to the ED.

## 2022-04-28 NOTE — Progress Notes (Signed)
ASHIMA, SHRAKE ([id number]) [id number] Page 1 of 8 Visit Report for 04/27/2022 Arrival Information Details Patient Name: Date of Service: [address], [name] 04/27/2022 10:45 A M Medical Record Number: [id number] Patient Account Number: [id number] Date of Birth/Sex: Treating RN: 1940-05-10 (81 y.o. Drema Pry Primary Care Leta Bucklin: Tommi Rumps Other Clinician: Referring Jayani Rozman: Treating Copper Kirtley/Extender: Conni Slipper in Treatment: 7 Visit Information History Since Last Visit Added or deleted any medications: No Patient Arrived: Ambulatory Any new allergies or adverse reactions: No Arrival Time: 10:54 Had a fall or experienced change in No Accompanied By: self activities of daily living that may affect Transfer Assistance: None risk of falls: Patient Requires Transmission-Based Precautions: No Hospitalized since last visit: No Patient Has Alerts: Yes Pain Present Now: No Patient Alerts: ABI left 1.15 10/28/21 ABI right 1.13 10/28/21 Electronic Signature(s) Signed: 04/27/2022 4:57:18 PM By: Rosalio Loud MSN RN CNS WTA Entered By: Rosalio Loud on 04/27/2022 10:55:20 -------------------------------------------------------------------------------- Clinic Level of Care Assessment Details Patient Name: Date of Service: [facility] RD, [place] RGA [name] 04/27/2022 10:45 A M Medical Record Number: [id number] Patient Account Number: [id number] Date of Birth/Sex: Treating RN: 02-12-1941 (81 y.o. Drema Pry Primary Care Saumya Hukill: Tommi Rumps Other Clinician: Referring Djibril Glogowski: Treating Aidden Markovic/Extender: Conni Slipper in Treatment: 7 Clinic Level of Care Assessment Items TOOL 4 Quantity Score X- 1 0 Use when only an EandM is performed on FOLLOW-UP visit ASSESSMENTS - Nursing Assessment / Reassessment X- 1 10 Reassessment of Co-morbidities (includes updates in patient  status) X- 1 5 Reassessment of Adherence to Treatment Plan ASSESSMENTS - Wound and Skin A ssessment / Reassessment X - Simple Wound Assessment / Reassessment - one wound 1 5 '[]'$  - 0 Complex Wound Assessment / Reassessment - multiple wounds Gatchell, Kawena G ([id number]) [id number] Page 2 of 8 '[]'$  - 0 Dermatologic / Skin Assessment (not related to wound area) ASSESSMENTS - Focused Assessment '[]'$  - 0 Circumferential Edema Measurements - multi extremities '[]'$  - 0 Nutritional Assessment / Counseling / Intervention '[]'$  - 0 Lower Extremity Assessment (monofilament, tuning fork, pulses) '[]'$  - 0 Peripheral Arterial Disease Assessment (using hand held doppler) ASSESSMENTS - Ostomy and/or Continence Assessment and Care '[]'$  - 0 Incontinence Assessment and Management '[]'$  - 0 Ostomy Care Assessment and Management (repouching, etc.) PROCESS - Coordination of Care X - Simple Patient / Family Education for ongoing care 1 15 '[]'$  - 0 Complex (extensive) Patient / Family Education for ongoing care X- 1 10 Staff obtains Programmer, systems, Records, T Results / Process Orders est '[]'$  - 0 Staff telephones HHA, Nursing Homes / Clarify orders / etc '[]'$  - 0 Routine Transfer to another Facility (non-emergent condition) '[]'$  - 0 Routine Hospital Admission (non-emergent condition) '[]'$  - 0 New Admissions / Biomedical engineer / Ordering NPWT Apligraf, etc. , '[]'$  - 0 Emergency Hospital Admission (emergent condition) X- 1 10 Simple Discharge Coordination '[]'$  - 0 Complex (extensive) Discharge Coordination PROCESS - Special Needs '[]'$  - 0 Pediatric / Minor Patient Management '[]'$  - 0 Isolation Patient Management '[]'$  - 0 Hearing / Language / Visual special needs '[]'$  - 0 Assessment of Community assistance (transportation, D/C planning, etc.) '[]'$  - 0 Additional assistance / Altered mentation '[]'$  - 0 Support Surface(s) Assessment (bed, cushion, seat, etc.) INTERVENTIONS - Wound Cleansing /  Measurement X - Simple Wound Cleansing - one wound 1 5 '[]'$  - 0 Complex Wound Cleansing - multiple wounds X- 1 5 Wound Imaging (photographs - any number of wounds) '[]'$  -  0 Wound Tracing (instead of photographs) X- 1 5 Simple Wound Measurement - one wound '[]'$  - 0 Complex Wound Measurement - multiple wounds INTERVENTIONS - Wound Dressings X - Small Wound Dressing one or multiple wounds 1 10 '[]'$  - 0 Medium Wound Dressing one or multiple wounds '[]'$  - 0 Large Wound Dressing one or multiple wounds '[]'$  - 0 Application of Medications - topical '[]'$  - 0 Application of Medications - injection INTERVENTIONS - Miscellaneous '[]'$  - 0 External ear exam '[]'$  - 0 Specimen Collection (cultures, biopsies, blood, body fluids, etc.) '[]'$  - 0 Specimen(s) / Culture(s) sent or taken to Lab for analysis '[]'$  - 0 Patient Transfer (multiple staff / Stormy Fabian / Similar devices) LARRISHA, BABINEAU ([id number]) [id number] Page 3 of 8 '[]'$  - 0 Simple Staple / Suture removal (25 or less) '[]'$  - 0 Complex Staple / Suture removal (26 or more) '[]'$  - 0 Hypo / Hyperglycemic Management (close monitor of Blood Glucose) '[]'$  - 0 Ankle / Brachial Index (ABI) - do not check if billed separately X- 1 5 Vital Signs Has the patient been seen at the hospital within the last three years: Yes Total Score: 85 Level Of Care: New/Established - Level 3 Electronic Signature(s) Signed: 04/27/2022 4:57:18 PM By: Rosalio Loud MSN RN CNS WTA Entered By: Rosalio Loud on 04/27/2022 11:14:32 -------------------------------------------------------------------------------- Encounter Discharge Information Details Patient Name: Date of Service: [address] RD, [place] RGA [name] 04/27/2022 10:45 A M Medical Record Number: [id number] Patient Account Number: [id number] Date of Birth/Sex: Treating RN: Feb 07, 1941 (81 y.o. Drema Pry Primary Care Oaklynn Stierwalt: Tommi Rumps Other Clinician: Referring Gahel Safley: Treating  Areta Terwilliger/Extender: Conni Slipper in Treatment: 7 Encounter Discharge Information Items Discharge Condition: Stable Ambulatory Status: Ambulatory Discharge Destination: Home Transportation: Private Auto Accompanied By: daughter Schedule Follow-up Appointment: No Clinical Summary of Care: Electronic Signature(s) Signed: 04/27/2022 4:57:18 PM By: Rosalio Loud MSN RN CNS WTA Entered By: Rosalio Loud on 04/27/2022 11:16:38 -------------------------------------------------------------------------------- Lower Extremity Assessment Details Patient Name: Date of Service: [facility] RD, [place] RGA [name] 04/27/2022 10:45 A M Medical Record Number: [id number] Patient Account Number: [id number] Date of Birth/Sex: Treating RN: 06/08/1940 (81 y.o. Drema Pry Primary Care Arshawn Valdez: Tommi Rumps Other Clinician: Referring Obed Samek: Treating Shanara Schnieders/Extender: Conni Slipper in Treatment: [address], Milinda Pointer ([id number]) [id number] Page 4 of 8 Edema Assessment Assessed: [Left: No] [Right: No] [Left: Edema] [Right: :] Calf Left: Right: Point of Measurement: 33 cm From Medial Instep 29.5 cm Ankle Left: Right: Point of Measurement: 11 cm From Medial Instep 19.3 cm Vascular Assessment Pulses: Dorsalis Pedis Palpable: [Right:Yes] Electronic Signature(s) Signed: 04/27/2022 4:57:18 PM By: Rosalio Loud MSN RN CNS WTA Entered By: Rosalio Loud on 04/27/2022 11:12:18 -------------------------------------------------------------------------------- Multi Wound Chart Details Patient Name: Date of Service: [address] RD, [place] RGA [name] 04/27/2022 10:45 A M Medical Record Number: [id number] Patient Account Number: [id number] Date of Birth/Sex: Treating RN: [birth year] (81 y.o. Drema Pry Primary Care Charlett Merkle: Tommi Rumps Other Clinician: Referring Kymberlie Brazeau: Treating Abdoulie Tierce/Extender: Conni Slipper in Treatment: 7 Vital Signs Height(in): 18 Pulse(bpm): 97 Weight(lbs): 120 Blood Pressure(mmHg): 131/69 Body Mass Index(BMI): 21.3 Temperature(F): 98.0 Respiratory Rate(breaths/min): 18 [2:Photos:] [N/A:N/A] Right, Anterior Lower Leg N/A N/A Wound Location: Surgical Injury N/A N/A Wounding Event: Open Surgical Wound N/A N/A Primary Etiology: Sleep Apnea, Hypertension, Type II N/A N/A Comorbid History: Diabetes, End Stage Renal Disease, Osteoarthritis, Neuropathy 02/23/2022 N/A N/A Date Acquired: 7 N/A N/A Weeks of Treatment: Healed - Epithelialized N/A N/A Wound  StatusLEVEDA, KENDRIX ([id number]) [id number] Page 5 of 8 No N/A N/A Wound Recurrence: 0x0x0 N/A N/A Measurements L x W x D (cm) 0 N/A N/A A (cm) : rea 0 N/A N/A Volume (cm) : 100.00% N/A N/A % Reduction in Area: 100.00% N/A N/A % Reduction in Volume: Full Thickness Without Exposed N/A N/A Classification: Support Structures Medium N/A N/A Exudate Amount: Serosanguineous N/A N/A Exudate Type: red, brown N/A N/A Exudate Color: Medium (34-66%) N/A N/A Granulation Amount: Pink N/A N/A Granulation Quality: Medium (34-66%) N/A N/A Necrotic Amount: Fat Layer (Subcutaneous Tissue): Yes N/A N/A Exposed Structures: Fascia: No Tendon: No Muscle: No Joint: No Bone: No None N/A N/A Epithelialization: Treatment Notes Electronic Signature(s) Signed: 04/27/2022 4:57:18 PM By: Rosalio Loud MSN RN CNS WTA Entered By: Rosalio Loud on 04/27/2022 11:12:32 -------------------------------------------------------------------------------- Multi-Disciplinary Care Plan Details Patient Name: Date of Service: [facility] RD, [place] RGA [name] 04/27/2022 10:45 A M Medical Record Number: [id number] Patient Account Number: [id number] Date of Birth/Sex: Treating RN: [birth year] (81 y.o. Drema Pry Primary Care Donavan Kerlin: Tommi Rumps Other  Clinician: Referring Festus Pursel: Treating Malini Flemings/Extender: Conni Slipper in Treatment: 7 Active Inactive Electronic Signature(s) Signed: 04/27/2022 4:57:18 PM By: Rosalio Loud MSN RN CNS WTA Entered By: Rosalio Loud on 04/27/2022 11:15:06 -------------------------------------------------------------------------------- Pain Assessment Details Patient Name: Date of Service: [address], [place] RGA [name] 04/27/2022 10:45 A RAYSHAWN, VISCONTI ([id number]) [id number] Page 6 of 8 Medical Record Number: [id number] Patient Account Number: [id number] Date of Birth/Sex: Treating RN: Jun 17, 1940 (81 y.o. Drema Pry Primary Care Elton Catalano: Tommi Rumps Other Clinician: Referring Frantz Quattrone: Treating Noland Pizano/Extender: Conni Slipper in Treatment: 7 Active Problems Location of Pain Severity and Description of Pain Patient Has Paino No Site Locations Pain Management and Medication Current Pain Management: Electronic Signature(s) Signed: 04/27/2022 4:57:18 PM By: Rosalio Loud MSN RN CNS WTA Entered By: Rosalio Loud on 04/27/2022 10:59:57 -------------------------------------------------------------------------------- Patient/Caregiver Education Details Patient Name: Date of Service: [address]ise Bow[address] RD, [place] RGA RETTE Darnell Level 12/20/2023andnbsp10:45 A M Medical Record Number: [id number] Patient Account Number: [id number] Date of Birth/Gender: Treating RN: [birth year] (81 y.o. Drema Pry Primary Care Physician: Tommi Rumps Other Clinician: Referring Physician: Treating Physician/Extender: Conni Slipper in Treatment: 7 Education Assessment Education Provided To: Patient Education Topics Provided Wound/Skin Impairment: Handouts: Caring for Your Ulcer Methods: Explain/Verbal Responses: State content correctly PIER, LAUX ([id number]) [id number] Page 7  of 8 Electronic Signature(s) Signed: 04/27/2022 4:57:18 PM By: Rosalio Loud MSN RN CNS WTA Entered By: Rosalio Loud on 04/27/2022 11:14:49 -------------------------------------------------------------------------------- Wound Assessment Details Patient Name: Date of Service: [facility] RD, [place] RGA [name] 04/27/2022 10:45 A M Medical Record Number: [id number] Patient Account Number: [id number] Date of Birth/Sex: Treating RN: [birth year] (81 y.o. Drema Pry Primary Care Joy Reiger: Tommi Rumps Other Clinician: Referring Keeya Dyckman: Treating Jirah Rider/Extender: Conni Slipper in Treatment: 7 Wound Status Wound Number: 2 Primary Open Surgical Wound Etiology: Wound Location: Right, Anterior Lower Leg Wound Healed - Epithelialized Wounding Event: Surgical Injury Status: Date Acquired: 02/23/2022 Comorbid Sleep Apnea, Hypertension, Type II Diabetes, End Stage Renal Weeks Of Treatment: 7 History: Disease, Osteoarthritis, Neuropathy Clustered Wound: No Photos Wound Measurements Length: (cm) Width: (cm) Depth: (cm) Area: (cm) Volume: (cm) 0 % Reduction in Area: 100% 0 % Reduction in Volume: 100% 0 Epithelialization: None 0 0 Wound Description Classification: Full Thickness Without Exposed Support Exudate Amount: Medium Exudate Type: Serosanguineous Exudate Color: red, brown Structures Foul Odor After Cleansing: No Slough/Fibrino Yes Wound  Bed Granulation Amount: Medium (34-66%) Exposed Structure Granulation Quality: Pink Fascia Exposed: No Necrotic Amount: Medium (34-66%) Fat Layer (Subcutaneous Tissue) Exposed: Yes Tendon Exposed: No Muscle Exposed: No Joint Exposed: No Bone Exposed: No Treatment Notes AREANA, KOSANKE ([id number]) [id number] Page 8 of 8 Wound #2 (Lower Leg) Wound Laterality: Right, Anterior Cleanser Peri-Wound Care Topical Primary Dressing Secondary Dressing Secured With Compression  Wrap Compression Stockings Add-Ons Electronic Signature(s) Signed: 04/27/2022 4:57:18 PM By: Rosalio Loud MSN RN CNS WTA Entered By: Rosalio Loud on 04/27/2022 11:12:13 -------------------------------------------------------------------------------- Vitals Details Patient Name: Date of Service: [facility] RD, [place] RGA [name] 04/27/2022 10:45 A M Medical Record Number: [id number] Patient Account Number: [id number] Date of Birth/Sex: Treating RN: [birth year] (81 y.o. Drema Pry Primary Care Kayson Tasker: Tommi Rumps Other Clinician: Referring Dabid Godown: Treating Franciszek Platten/Extender: Conni Slipper in Treatment: 7 Vital Signs Time Taken: 10:57 Temperature (F): 98.0 Height (in): 63 Pulse (bpm): 97 Weight (lbs): 120 Respiratory Rate (breaths/min): 18 Body Mass Index (BMI): 21.3 Blood Pressure (mmHg): 131/69 Reference Range: 80 - 120 mg / dl Electronic Signature(s) Signed: 04/27/2022 4:57:18 PM By: Rosalio Loud MSN RN CNS WTA Entered By: Rosalio Loud on 04/27/2022 10:59:36

## 2022-04-29 NOTE — Telephone Encounter (Signed)
Noted.  Plan to discuss at her visit next week.

## 2022-05-03 ENCOUNTER — Ambulatory Visit: Payer: Medicare Other | Admitting: Family Medicine

## 2022-05-09 HISTORY — PX: OTHER SURGICAL HISTORY: SHX169

## 2022-05-12 ENCOUNTER — Encounter: Payer: Self-pay | Admitting: Family Medicine

## 2022-05-12 ENCOUNTER — Ambulatory Visit (INDEPENDENT_AMBULATORY_CARE_PROVIDER_SITE_OTHER): Payer: Medicare Other | Admitting: Family Medicine

## 2022-05-12 VITALS — BP 130/70 | HR 69 | Temp 97.7°F | Ht 63.0 in | Wt 122.4 lb

## 2022-05-12 DIAGNOSIS — E1122 Type 2 diabetes mellitus with diabetic chronic kidney disease: Secondary | ICD-10-CM

## 2022-05-12 DIAGNOSIS — I10 Essential (primary) hypertension: Secondary | ICD-10-CM

## 2022-05-12 DIAGNOSIS — N184 Chronic kidney disease, stage 4 (severe): Secondary | ICD-10-CM

## 2022-05-12 DIAGNOSIS — E785 Hyperlipidemia, unspecified: Secondary | ICD-10-CM | POA: Diagnosis not present

## 2022-05-12 DIAGNOSIS — R413 Other amnesia: Secondary | ICD-10-CM | POA: Diagnosis not present

## 2022-05-12 DIAGNOSIS — Z794 Long term (current) use of insulin: Secondary | ICD-10-CM | POA: Diagnosis not present

## 2022-05-12 MED ORDER — AMLODIPINE BESYLATE 10 MG PO TABS: 10.0000 mg | ORAL_TABLET | Freq: Every day | ORAL | 3 refills | Status: DC

## 2022-05-12 MED ORDER — BD PEN NEEDLE NANO 2ND GEN 32G X 4 MM MISC: 2 refills | Status: DC

## 2022-05-12 MED ORDER — ROSUVASTATIN CALCIUM 10 MG PO TABS: 10.0000 mg | ORAL_TABLET | Freq: Every day | ORAL | 3 refills | Status: DC

## 2022-05-12 NOTE — Assessment & Plan Note (Signed)
Likely age-related memory decline.  She passed her mini cog testing.  Discussed that we can continue to monitor her off of the higher dose of the statin and see how she does.  If they notice any significant worsening they will let us know.

## 2022-05-12 NOTE — Progress Notes (Signed)
Tommi Rumps, MD Phone: [phone]  Kelly [name] is a 82 y.o. female who presents today for follow-up.  Diabetes: Patient has reduced her Basaglar to 6 units daily.  She had nausea and vomiting frequently on the 8 unit dose.  She continues on Victoza.  She does eat and drink sugary things.  Hyperlipidemia: Patient has completely stopped her Lipitor due to concern for memory issues and laying in bed for many hours a day.  She notes these things seem to have improved with stopping the Lipitor.  Memory difficulty: Patient's daughter reports that she forgets to take her medicines at times and forgets things that she did earlier in the day.  The patient feels as though this has improved some since she stopped her statin.  She notes no depression.  Hypertension: Taking amlodipine and losartan.  Recent blood pressures at home have been in the 416 systolic.  Social History   Tobacco Use  Smoking Status Never  Smokeless Tobacco Never    Current Outpatient Medications on File Prior to Visit  Medication Sig Dispense Refill   aspirin EC 81 MG tablet Take 81 mg by mouth daily.     BD PEN NEEDLE NANO 2ND GEN 32G X 4 MM MISC USE DAILY WITH VICTOZA AND BASAGLAR 180 each 0   calcitRIOL (ROCALTROL) 0.25 MCG capsule Take 0.25 mcg by mouth daily.     Cholecalciferol (VITAMIN D) 50 MCG (2000 UT) CAPS Take 2,000 Units by mouth daily.      Continuous Blood Gluc Sensor (FREESTYLE LIBRE 2 SENSOR) MISC USE TO CHECK SUGAR AT LEAST4 TIMES A DAY 6 each 3   cyclobenzaprine (FLEXERIL) 10 MG tablet TAKE 1/2 TABLET ('5MG'$  TOTAL)3 TIMES A DAY AS NEEDED    FOR MUSCLE SPASMS 45 tablet 0   diclofenac (FLECTOR) 1.3 % PTCH 1 patch 2 (two) times daily.     doxycycline (VIBRAMYCIN) 100 MG capsule Take 100 mg by mouth 2 (two) times daily.     furosemide (LASIX) 40 MG tablet Take 40 mg by mouth daily as needed.     gabapentin (NEURONTIN) 100 MG capsule Take 1 capsule (100 mg total) by mouth 3 (three) times daily.  180 capsule 1   glucosamine-chondroitin 500-400 MG tablet Take 1 tablet by mouth 3 (three) times daily.     glucose blood (ACCU-CHEK AVIVA PLUS) test strip Use to check blood sugar up to 4 times daily 100 each 3   HYDROcodone-acetaminophen (NORCO/VICODIN) 5-325 MG tablet Take 1 tablet by mouth every 4 (four) hours as needed.     Insulin Glargine (BASAGLAR KWIKPEN) 100 UNIT/ML INJECT 20 UNITS TOTAL      SUBCUTANEOUSLY DAILY. (Patient taking differently: 8 Units daily.) 15 mL 0   Lancets MISC Use up to 4 times daily to check blood sugars. 200 each 11   liraglutide (VICTOZA) 18 MG/3ML SOPN Inject 1.8 mg into the skin daily. 27 mL 1   losartan (COZAAR) 50 MG tablet TAKE 1 TABLET DAILY 90 tablet 1   Melatonin 5 MG CHEW Chew 5 mg by mouth at bedtime as needed.     metoCLOPramide (REGLAN) 10 MG tablet Take 1 tablet (10 mg total) by mouth every 6 (six) hours as needed for nausea or vomiting. 15 tablet 0   mupirocin ointment (BACTROBAN) 2 % Apply 1 application. topically 2 (two) times daily. Right leg wound 30 g 0   potassium chloride (K-DUR) 10 MEQ tablet Take 10 mEq by mouth daily as needed.     sertraline (  ZOLOFT) 50 MG tablet Take 1 tablet (50 mg total) by mouth daily. 90 tablet 2   triamcinolone ointment (KENALOG) 0.1 % Apply 1 Application topically 2 (two) times daily as needed (rash). 80 g 0   Current Facility-Administered Medications on File Prior to Visit  Medication Dose Route Frequency Provider Last Rate Last Admin   denosumab (PROLIA) injection 60 mg  60 mg Subcutaneous Q6 months [name], MD   60 mg at 04/05/21 1307     ROS see history of present illness  Objective  Physical Exam Vitals:   05/12/22 1046  BP: 130/70  Pulse: 69  Temp: 97.7 F (36.5 C)  SpO2: 98%    BP Readings from Last 3 Encounters:  05/12/22 130/70  04/27/22 (!) 145/77  04/11/22 125/69   Wt Readings from Last 3 Encounters:  05/12/22 122 lb 6.4 oz (55.5 kg)  04/26/22 123 lb (55.8 kg)  04/11/22  123 lb (55.8 kg)    Physical Exam Constitutional:      General: She is not in acute distress.    Appearance: She is not diaphoretic.  Cardiovascular:     Rate and Rhythm: Normal rate and regular rhythm.     Heart sounds: Normal heart sounds.  Pulmonary:     Effort: Pulmonary effort is normal.     Breath sounds: Normal breath sounds.  Skin:    General: Skin is warm and dry.  Neurological:     Mental Status: She is alert.    Mini cog testing 5/5  Assessment/Plan: Please see individual problem list.  Primary hypertension Assessment & Plan: Chronic issue.  Uncontrolled at home.  We will increase amlodipine to 10 mg daily and continue losartan 50 mg daily.  She will follow-up in 2 months.  Orders: -     amLODIPine Besylate; Take 1 tablet (10 mg total) by mouth daily.  Dispense: 90 tablet; Refill: 3  Type 2 diabetes mellitus with stage 4 chronic kidney disease, with long-term current use of insulin ([place]) Assessment & Plan: Chronic issue.  Uncontrolled.  It is certainly possible that the higher dose of Basaglar was contributing to nausea and vomiting.  She will continue Basaglar 6 units daily and Victoza 1.8 mg daily.  I am going to refer her to endocrinology for further input on management of her diabetes given the difficulty of getting things under control with current medications.  Orders: -     Hemoglobin A1c; Future -     Ambulatory referral to Endocrinology  Hyperlipidemia, unspecified hyperlipidemia type Assessment & Plan: Chronic issue.  We will start Crestor 10 mg daily and see if she is able to tolerate that.  Discussed the benefit of being on a statin for risk reduction of cardiovascular disease.  Discussed that typically the benefit of a statin outweighs the risk of insulin resistance issues and memory issues.  She will have lab work at follow-up in 2 months.  Orders: -     Rosuvastatin Calcium; Take 1 tablet (10 mg total) by mouth daily.  Dispense: 90 tablet; Refill:  3 -     Hepatic function panel; Future -     Lipid panel; Future  Memory difficulty Assessment & Plan: Likely age-related memory decline.  She passed her mini cog testing.  Discussed that we can continue to monitor her off of the higher dose of the statin and see how she does.  If they notice any significant worsening they will let us know.      Return  in about 2 months (around 07/11/2022).   Tommi Rumps, MD [place]

## 2022-05-12 NOTE — Addendum Note (Signed)
Addended by: [name] on: 05/12/2022 02:11 PM   Modules accepted: Orders

## 2022-05-12 NOTE — Patient Instructions (Signed)
Nice to see you. We will have you return in 2 months for follow-up and labs.  If you have issues with the crestor or the amlodipine please let me know.

## 2022-05-12 NOTE — Assessment & Plan Note (Signed)
Chronic issue.  Uncontrolled at home.  We will increase amlodipine to 10 mg daily and continue losartan 50 mg daily.  She will follow-up in 2 months.

## 2022-05-12 NOTE — Assessment & Plan Note (Signed)
Chronic issue.  Uncontrolled.  It is certainly possible that the higher dose of Basaglar was contributing to nausea and vomiting.  She will continue Basaglar 6 units daily and Victoza 1.8 mg daily.  I am going to refer her to endocrinology for further input on management of her diabetes given the difficulty of getting things under control with current medications.

## 2022-05-12 NOTE — Assessment & Plan Note (Signed)
Chronic issue.  We will start Crestor 10 mg daily and see if she is able to tolerate that.  Discussed the benefit of being on a statin for risk reduction of cardiovascular disease.  Discussed that typically the benefit of a statin outweighs the risk of insulin resistance issues and memory issues.  She will have lab work at follow-up in 2 months.

## 2022-05-26 ENCOUNTER — Telehealth: Payer: Self-pay | Admitting: Family Medicine

## 2022-05-26 DIAGNOSIS — Z79899 Other long term (current) drug therapy: Secondary | ICD-10-CM | POA: Diagnosis not present

## 2022-05-26 DIAGNOSIS — E785 Hyperlipidemia, unspecified: Secondary | ICD-10-CM | POA: Diagnosis not present

## 2022-05-26 DIAGNOSIS — Z794 Long term (current) use of insulin: Secondary | ICD-10-CM | POA: Diagnosis not present

## 2022-05-26 DIAGNOSIS — F32A Depression, unspecified: Secondary | ICD-10-CM | POA: Diagnosis not present

## 2022-05-26 DIAGNOSIS — G4733 Obstructive sleep apnea (adult) (pediatric): Secondary | ICD-10-CM | POA: Diagnosis not present

## 2022-05-26 DIAGNOSIS — N184 Chronic kidney disease, stage 4 (severe): Secondary | ICD-10-CM | POA: Diagnosis not present

## 2022-05-26 DIAGNOSIS — R8271 Bacteriuria: Secondary | ICD-10-CM | POA: Diagnosis not present

## 2022-05-26 DIAGNOSIS — K3184 Gastroparesis: Secondary | ICD-10-CM | POA: Insufficient documentation

## 2022-05-26 DIAGNOSIS — E1122 Type 2 diabetes mellitus with diabetic chronic kidney disease: Secondary | ICD-10-CM | POA: Diagnosis not present

## 2022-05-26 DIAGNOSIS — N3001 Acute cystitis with hematuria: Secondary | ICD-10-CM | POA: Diagnosis not present

## 2022-05-26 DIAGNOSIS — Z66 Do not resuscitate: Secondary | ICD-10-CM | POA: Diagnosis not present

## 2022-05-26 DIAGNOSIS — Z20822 Contact with and (suspected) exposure to covid-19: Secondary | ICD-10-CM | POA: Diagnosis not present

## 2022-05-26 DIAGNOSIS — J150 Pneumonia due to Klebsiella pneumoniae: Secondary | ICD-10-CM | POA: Diagnosis not present

## 2022-05-26 DIAGNOSIS — N189 Chronic kidney disease, unspecified: Secondary | ICD-10-CM | POA: Insufficient documentation

## 2022-05-26 DIAGNOSIS — E119 Type 2 diabetes mellitus without complications: Secondary | ICD-10-CM | POA: Diagnosis not present

## 2022-05-26 DIAGNOSIS — Z7982 Long term (current) use of aspirin: Secondary | ICD-10-CM | POA: Diagnosis not present

## 2022-05-26 DIAGNOSIS — Z79620 Long term (current) use of immunosuppressive biologic: Secondary | ICD-10-CM | POA: Diagnosis not present

## 2022-05-26 DIAGNOSIS — R0600 Dyspnea, unspecified: Secondary | ICD-10-CM | POA: Diagnosis not present

## 2022-05-26 DIAGNOSIS — B961 Klebsiella pneumoniae [K. pneumoniae] as the cause of diseases classified elsewhere: Secondary | ICD-10-CM | POA: Diagnosis not present

## 2022-05-26 DIAGNOSIS — R1114 Bilious vomiting: Secondary | ICD-10-CM | POA: Diagnosis not present

## 2022-05-26 DIAGNOSIS — R1110 Vomiting, unspecified: Secondary | ICD-10-CM

## 2022-05-26 DIAGNOSIS — R112 Nausea with vomiting, unspecified: Secondary | ICD-10-CM | POA: Diagnosis not present

## 2022-05-26 DIAGNOSIS — M5030 Other cervical disc degeneration, unspecified cervical region: Secondary | ICD-10-CM | POA: Diagnosis not present

## 2022-05-26 DIAGNOSIS — E1143 Type 2 diabetes mellitus with diabetic autonomic (poly)neuropathy: Secondary | ICD-10-CM | POA: Diagnosis not present

## 2022-05-26 DIAGNOSIS — Z6821 Body mass index (BMI) 21.0-21.9, adult: Secondary | ICD-10-CM | POA: Diagnosis not present

## 2022-05-26 DIAGNOSIS — N179 Acute kidney failure, unspecified: Secondary | ICD-10-CM | POA: Diagnosis not present

## 2022-05-26 DIAGNOSIS — R413 Other amnesia: Secondary | ICD-10-CM | POA: Diagnosis not present

## 2022-05-26 DIAGNOSIS — I10 Essential (primary) hypertension: Secondary | ICD-10-CM | POA: Diagnosis not present

## 2022-05-26 DIAGNOSIS — N3000 Acute cystitis without hematuria: Secondary | ICD-10-CM | POA: Diagnosis not present

## 2022-05-26 DIAGNOSIS — E440 Moderate protein-calorie malnutrition: Secondary | ICD-10-CM | POA: Diagnosis not present

## 2022-05-26 NOTE — Telephone Encounter (Signed)
I spoke with the patients daughter and she stated the patient has been throwing up everything she eats even her medications and they are whole pills, she is in the bed 16 straight hours and refuses to go the [facility] emergency room, I talked the daughter into taking the patient to [facility] ER in [place] today. The would like a referral to [facility] in [place] as well.  Harlie Ragle,cma

## 2022-05-26 NOTE — Telephone Encounter (Signed)
Pt daughter called stating the pt was throwing up bowels last night and pt threw up a pill that she had not digested in seven hours. Pt daughter would like to be called

## 2022-05-27 DIAGNOSIS — N3001 Acute cystitis with hematuria: Secondary | ICD-10-CM | POA: Insufficient documentation

## 2022-05-27 NOTE — Telephone Encounter (Signed)
Patient is currently admitted at [facility].

## 2022-05-27 NOTE — Telephone Encounter (Signed)
I can place this referral for her. Please confirm that she was taken to the ED for follow-up.

## 2022-05-27 NOTE — Telephone Encounter (Signed)
Left message for the Patient or her daughter to call us back

## 2022-05-30 ENCOUNTER — Telehealth: Payer: Self-pay | Admitting: *Deleted

## 2022-05-30 NOTE — Patient Outreach (Signed)
  Care Coordination [facility] Note Transition Care Management Follow-up Telephone Call Date of discharge and from where: [phone] [facility]  How have you been since you were released from the hospital? I have been sleeping a lot, I have been eating and keeping my food down.  Any questions or concerns? No  Items Reviewed: Did the pt receive and understand the discharge instructions provided? Yes  Medications obtained and verified? Yes  Other? No  Any new allergies since your discharge? No  Dietary orders reviewed? No Do you have support at home? Yes   Home Care and Equipment/Supplies: Were home health services ordered? no If so, what is the name of the agency? N/a  Has the agency set up a time to come to the patient's home? not applicable Were any new equipment or medical supplies ordered?  No What is the name of the medical supply agency? N/a Were you able to get the supplies/equipment? not applicable Do you have any questions related to the use of the equipment or supplies? No  Functional Questionnaire: (I = Independent and D = Dependent) ADLs: I  Bathing/Dressing- I  Meal Prep- D  Eating- II  Maintaining continence- I  Transferring/Ambulation- I  Managing Meds- II  Follow up appointments reviewed:  PCP Hospital f/u appt confirmed? N RN calling for appointment with PCP . [facility] f/u appt confirmed? No  . Are transportation arrangements needed? No  If their condition worsens, is the pt aware to call PCP or go to the Emergency Dept.? Yes Was the patient provided with contact information for the PCP's office or ED? Yes Was to pt encouraged to call back with questions or concerns? Yes  SDOH assessments and interventions completed:   Yes SDOH Interventions Today    Flowsheet Row Most Recent Value  SDOH Interventions   Food Insecurity Interventions Intervention Not Indicated  Housing Interventions Intervention Not Indicated  Transportation Interventions  Intervention Not Indicated       Care Coordination Interventions:  PCP follow up appointment requested   Encounter Outcome:  Pt. Visit Completed    [place] [facility] [phone]

## 2022-06-01 ENCOUNTER — Ambulatory Visit (INDEPENDENT_AMBULATORY_CARE_PROVIDER_SITE_OTHER): Payer: Medicare Other | Admitting: Family Medicine

## 2022-06-01 ENCOUNTER — Encounter: Payer: Self-pay | Admitting: Family Medicine

## 2022-06-01 VITALS — BP 120/70 | HR 66 | Temp 98.4°F | Ht 63.0 in | Wt 123.8 lb

## 2022-06-01 DIAGNOSIS — R112 Nausea with vomiting, unspecified: Secondary | ICD-10-CM | POA: Diagnosis not present

## 2022-06-01 DIAGNOSIS — E119 Type 2 diabetes mellitus without complications: Secondary | ICD-10-CM | POA: Diagnosis not present

## 2022-06-01 DIAGNOSIS — Z794 Long term (current) use of insulin: Secondary | ICD-10-CM

## 2022-06-01 DIAGNOSIS — N3001 Acute cystitis with hematuria: Secondary | ICD-10-CM | POA: Diagnosis not present

## 2022-06-01 DIAGNOSIS — K3184 Gastroparesis: Secondary | ICD-10-CM

## 2022-06-01 DIAGNOSIS — N184 Chronic kidney disease, stage 4 (severe): Secondary | ICD-10-CM | POA: Diagnosis not present

## 2022-06-01 DIAGNOSIS — R634 Abnormal weight loss: Secondary | ICD-10-CM | POA: Diagnosis not present

## 2022-06-01 DIAGNOSIS — K5900 Constipation, unspecified: Secondary | ICD-10-CM | POA: Diagnosis not present

## 2022-06-01 DIAGNOSIS — E1122 Type 2 diabetes mellitus with diabetic chronic kidney disease: Secondary | ICD-10-CM

## 2022-06-01 NOTE — Assessment & Plan Note (Addendum)
Chronic issue.  I suspect her diabetes is contributing to this issue.  It is also possible that her GLP-1 inhibitor (Victoza and previously Ozempic) could have played a role and her narcotic medications could also be playing a role.  She will see GI later today.  She can continue the Reglan 5 mg by mouth 30 minutes before each meal.  Advise she needed to see what GI thought about taking this long-term given the risk of tar dive dyskinesia.  Discussed it is possible she could switch to an as needed use.

## 2022-06-01 NOTE — Assessment & Plan Note (Signed)
Plan to recheck urine in 3 to 4 weeks to confirm clearing of microscopic blood.  Discussed that she may just be colonized with bacteria given that she has not had any urinary tract symptoms.

## 2022-06-01 NOTE — Assessment & Plan Note (Signed)
Patient will have her labs rechecked by nephrology later this week.  She will follow-up with nephrology as planned.

## 2022-06-01 NOTE — Patient Instructions (Signed)
Nice to see you. Please see what the GI doctor says about taking the reglan daily. There is some risk of tardive dyskinesia with this medication with long term use.   Tardive Dyskinesia Tardive dyskinesia is a disorder that causes uncontrollable body movements. It occurs in some people who are taking certain medicines to treat a mental illness (neuroleptic medicine) or have taken this type of medicine in the past. These medicines block the effects of a specific brain chemical called dopamine.  Sometimes, tardive dyskinesia starts months or years after someone took the medicine. Not everyone who takes a neuroleptic medicine will get tardive dyskinesia. What are the causes? This condition is caused by changes in your brain that are associated with taking a neuroleptic medicine. What increases the risk? If you are taking a neuroleptic medicine, your risk for tardive dyskinesia may be higher if: You are taking an older type of neuroleptic medicine. You have been taking the medicine for a long time at a high dose. You are a woman past the age of menopause. You are older than 60 years. You have a history of alcohol or drug abuse. What are the signs or symptoms? Abnormal, uncontrollable movements are the main symptom of tardive dyskinesia. These types of movements may include: Grimacing. Sticking out or twisting your tongue. Making chewing or sucking sounds. Blinking your eyes. Twisting, swaying, or thrusting your body. Foot tapping or finger waving. Rapid movements of your arms or legs. How is this diagnosed? Your health care provider may suspect that you have tardive dyskinesia if: You have been taking neuroleptic medicines. You have abnormal movements that you cannot control. If you are taking a medicine that can cause tardive dyskinesia, your health care provider may screen you for early signs of the condition. This may include: Observing your body movements. Using a specific rating scale  called the Abnormal Involuntary Movement Scale (AIMS). You may also have tests to rule out other conditions that cause abnormal body movements, including: Parkinson's disease. Huntington's disease. Stroke. How is this treated? The best treatment for tardive dyskinesia is to lower the dose of your medicine or to switch to a different medicine at the first sign of abnormal and uncontrolled movements. There is no cure for long-term (chronic) tardive dyskinesia. Some medicines may help control the movements. These include: Clozapine, a medicine used to treat mental illness (antipsychotic). Some muscle relaxants. Some anti-seizure medicines. Some medicines used to treat high blood pressure. Some tranquilizers (sedatives). Follow these instructions at home:     Take over-the-counter and prescription medicines only as told by your health care provider. Do not stop or start taking any medicines without talking to your health care provider first. Do not abuse drugs or alcohol. Keep all follow-up visits. This is important. Contact a health care provider if: You are unable to eat or drink. You have had a fall. Your symptoms get worse. Summary Tardive dyskinesia is a disorder that causes uncontrollable body movements. These may include grimacing, sticking out or twisting your tongue, blinking your eyes, or rapid movements of your arms or legs. The condition occurs in some people who are taking certain medicines to treat a mental illness or have taken this type of medicine in the past. The best treatment for tardive dyskinesia is to lower the dose of your medicine or to switch to a different medicine at the first sign of abnormal and uncontrolled movements. There is no cure for long-term (chronic) tardive dyskinesia, but some medicines may help control the  movements. This information is not intended to replace advice given to you by your health care provider. Make sure you discuss any questions you  have with your health care provider. Document Revised: 03/21/2021 Document Reviewed: 03/21/2021 Elsevier Patient Education  [place].

## 2022-06-01 NOTE — Progress Notes (Signed)
Tommi Rumps, MD Phone: [phone]  Kelly [name] is a 82 y.o. female who presents today for follow-up.  Gastroparesis: Patient was hospitalized for issues with vomiting.  She was noted to have likely gastroparesis with her big toes and narcotic use possibly contributing as well.  They started her on Reglan 30 minutes before meals and since discharge she has not had any vomiting episodes.  She has only had 1 episode of nausea.  She is no longer on Victoza.  She has a GI evaluation today.  Diabetes: Patient's daughter notes the patient had a spike in her sugar to 300 with eating a box of cookies though otherwise the highest her sugars gotten is 169.  She is on Basaglar 6 units daily.  Her average sugar over the last 2 weeks is 177.  UTI: Patient was treated for UTI in the hospital with her urine culture growing Klebsiella.  She noted no urine symptoms at that time.  She wonders about rechecking her urine today.  She finished her antibiotic yesterday.  She notes no urine symptoms today.  AKI on CKD: Creatinine did trend down by discharge.  Patient is scheduled to have labs through nephrology this week and follows up with nephrology next week.  Social History   Tobacco Use  Smoking Status Never  Smokeless Tobacco Never    Current Outpatient Medications on File Prior to Visit  Medication Sig Dispense Refill   amLODipine (NORVASC) 10 MG tablet Take 1 tablet (10 mg total) by mouth daily. 90 tablet 3   aspirin EC 81 MG tablet Take 81 mg by mouth daily.     calcitRIOL (ROCALTROL) 0.25 MCG capsule Take 0.25 mcg by mouth daily.     Cholecalciferol (VITAMIN D) 50 MCG (2000 UT) CAPS Take 2,000 Units by mouth daily.      Continuous Blood Gluc Sensor (FREESTYLE LIBRE 2 SENSOR) MISC USE TO CHECK SUGAR AT LEAST4 TIMES A DAY 6 each 3   cyclobenzaprine (FLEXERIL) 10 MG tablet TAKE 1/2 TABLET ('5MG'$  TOTAL)3 TIMES A DAY AS NEEDED    FOR MUSCLE SPASMS 45 tablet 0   diclofenac (FLECTOR) 1.3 % PTCH  1 patch 2 (two) times daily.     furosemide (LASIX) 40 MG tablet Take 40 mg by mouth daily as needed.     gabapentin (NEURONTIN) 100 MG capsule Take 1 capsule (100 mg total) by mouth 3 (three) times daily. 180 capsule 1   glucosamine-chondroitin 500-400 MG tablet Take 1 tablet by mouth 3 (three) times daily.     glucose blood (ACCU-CHEK AVIVA PLUS) test strip Use to check blood sugar up to 4 times daily 100 each 3   HYDROcodone-acetaminophen (NORCO/VICODIN) 5-325 MG tablet Take 1 tablet by mouth every 4 (four) hours as needed.     Insulin Glargine (BASAGLAR KWIKPEN) 100 UNIT/ML INJECT 20 UNITS TOTAL      SUBCUTANEOUSLY DAILY. (Patient taking differently: 8 Units daily.) 15 mL 0   Insulin Pen Needle (BD PEN NEEDLE NANO 2ND GEN) 32G X 4 MM MISC USE DAILY WITH VICTOZA AND BASAGLAR 180 each 2   Lancets MISC Use up to 4 times daily to check blood sugars. 200 each 11   losartan (COZAAR) 50 MG tablet TAKE 1 TABLET DAILY 90 tablet 1   Melatonin 5 MG CHEW Chew 5 mg by mouth at bedtime as needed.     metoCLOPramide (REGLAN) 10 MG tablet Take 1 tablet (10 mg total) by mouth every 6 (six) hours as needed for nausea or  vomiting. 15 tablet 0   potassium chloride (K-DUR) 10 MEQ tablet Take 10 mEq by mouth daily as needed.     rosuvastatin (CRESTOR) 10 MG tablet Take 1 tablet (10 mg total) by mouth daily. 90 tablet 3   sertraline (ZOLOFT) 50 MG tablet Take 1 tablet (50 mg total) by mouth daily. 90 tablet 2   Calcium Citrate-Vitamin D 315-5 MG-MCG TABS Take 1 tablet by mouth daily.     liraglutide (VICTOZA) 18 MG/3ML SOPN Inject 1.8 mg into the skin daily. (Patient not taking: Reported on 06/01/2022) 27 mL 1   Current Facility-Administered Medications on File Prior to Visit  Medication Dose Route Frequency Provider Last Rate Last Admin   denosumab (PROLIA) injection 60 mg  60 mg Subcutaneous Q6 months [name], MD   60 mg at 04/05/21 1307     ROS see history of present  illness  Objective  Physical Exam Vitals:   06/01/22 1121  BP: 120/70  Pulse: 66  Temp: 98.4 F (36.9 C)  SpO2: 97%    BP Readings from Last 3 Encounters:  06/01/22 120/70  05/12/22 130/70  04/27/22 (!) 145/77   Wt Readings from Last 3 Encounters:  06/01/22 123 lb 12.8 oz (56.2 kg)  05/12/22 122 lb 6.4 oz (55.5 kg)  04/26/22 123 lb (55.8 kg)    Physical Exam Constitutional:      General: She is not in acute distress.    Appearance: She is not diaphoretic.  Cardiovascular:     Rate and Rhythm: Normal rate and regular rhythm.     Heart sounds: Normal heart sounds.  Pulmonary:     Effort: Pulmonary effort is normal.     Breath sounds: Normal breath sounds.  Skin:    General: Skin is warm and dry.  Neurological:     Mental Status: She is alert.      Assessment/Plan: Please see individual problem list.  Acute cystitis with hematuria Assessment & Plan: Plan to recheck urine in 3 to 4 weeks to confirm clearing of microscopic blood.  Discussed that she may just be colonized with bacteria given that she has not had any urinary tract symptoms.  Orders: -     POCT urinalysis dipstick; Future  Gastroparesis Assessment & Plan: Chronic issue.  I suspect her diabetes is contributing to this issue.  It is also possible that her GLP-1 inhibitor (Victoza and previously Ozempic) could have played a role and her narcotic medications could also be playing a role.  She will see GI later today.  She can continue the Reglan 5 mg by mouth 30 minutes before each meal.  Advise she needed to see what GI thought about taking this long-term given the risk of tar dive dyskinesia.  Discussed it is possible she could switch to an as needed use.   Type 2 diabetes mellitus with stage 4 chronic kidney disease, with long-term current use of insulin ([place]) Assessment & Plan: Chronic issue.  A1c is adequately controlled for her age during her hospitalization.  She will continue Basaglar 6 units  daily.  She will follow-up with me in 3 months.  She will let me know if her sugars go up prior to then.   Chronic kidney disease, stage 4 (severe) (HCC) Assessment & Plan: Patient will have her labs rechecked by nephrology later this week.  She will follow-up with nephrology as planned.     Return in about 3 months (around 08/31/2022) for DM f/u, 3-week urine check.  Tommi Rumps, MD [place]

## 2022-06-01 NOTE — Assessment & Plan Note (Signed)
Chronic issue.  A1c is adequately controlled for her age during her hospitalization.  She will continue Basaglar 6 units daily.  She will follow-up with me in 3 months.  She will let me know if her sugars go up prior to then.

## 2022-06-02 DIAGNOSIS — N1832 Chronic kidney disease, stage 3b: Secondary | ICD-10-CM | POA: Diagnosis not present

## 2022-06-02 DIAGNOSIS — N2581 Secondary hyperparathyroidism of renal origin: Secondary | ICD-10-CM | POA: Diagnosis not present

## 2022-06-02 DIAGNOSIS — I10 Essential (primary) hypertension: Secondary | ICD-10-CM | POA: Diagnosis not present

## 2022-06-02 DIAGNOSIS — E1122 Type 2 diabetes mellitus with diabetic chronic kidney disease: Secondary | ICD-10-CM | POA: Diagnosis not present

## 2022-06-08 DIAGNOSIS — N1832 Chronic kidney disease, stage 3b: Secondary | ICD-10-CM | POA: Diagnosis not present

## 2022-06-08 DIAGNOSIS — N2581 Secondary hyperparathyroidism of renal origin: Secondary | ICD-10-CM | POA: Diagnosis not present

## 2022-06-08 DIAGNOSIS — I10 Essential (primary) hypertension: Secondary | ICD-10-CM | POA: Diagnosis not present

## 2022-06-08 DIAGNOSIS — E1122 Type 2 diabetes mellitus with diabetic chronic kidney disease: Secondary | ICD-10-CM | POA: Diagnosis not present

## 2022-06-15 DIAGNOSIS — M25551 Pain in right hip: Secondary | ICD-10-CM | POA: Diagnosis not present

## 2022-06-15 DIAGNOSIS — Q762 Congenital spondylolisthesis: Secondary | ICD-10-CM | POA: Diagnosis not present

## 2022-06-15 DIAGNOSIS — Z79899 Other long term (current) drug therapy: Secondary | ICD-10-CM | POA: Diagnosis not present

## 2022-06-15 DIAGNOSIS — M5032 Other cervical disc degeneration, mid-cervical region, unspecified level: Secondary | ICD-10-CM | POA: Diagnosis not present

## 2022-06-15 DIAGNOSIS — M5136 Other intervertebral disc degeneration, lumbar region: Secondary | ICD-10-CM | POA: Diagnosis not present

## 2022-06-15 DIAGNOSIS — M25552 Pain in left hip: Secondary | ICD-10-CM | POA: Diagnosis not present

## 2022-06-15 DIAGNOSIS — M545 Low back pain, unspecified: Secondary | ICD-10-CM | POA: Diagnosis not present

## 2022-06-15 DIAGNOSIS — M25572 Pain in left ankle and joints of left foot: Secondary | ICD-10-CM | POA: Diagnosis not present

## 2022-06-15 DIAGNOSIS — M7062 Trochanteric bursitis, left hip: Secondary | ICD-10-CM | POA: Diagnosis not present

## 2022-06-15 DIAGNOSIS — M533 Sacrococcygeal disorders, not elsewhere classified: Secondary | ICD-10-CM | POA: Diagnosis not present

## 2022-06-15 DIAGNOSIS — M542 Cervicalgia: Secondary | ICD-10-CM | POA: Diagnosis not present

## 2022-06-15 DIAGNOSIS — T2101XA Burn of unspecified degree of chest wall, initial encounter: Secondary | ICD-10-CM | POA: Diagnosis not present

## 2022-06-15 DIAGNOSIS — E1165 Type 2 diabetes mellitus with hyperglycemia: Secondary | ICD-10-CM | POA: Diagnosis not present

## 2022-06-15 DIAGNOSIS — M6281 Muscle weakness (generalized): Secondary | ICD-10-CM | POA: Diagnosis not present

## 2022-06-21 DIAGNOSIS — E1122 Type 2 diabetes mellitus with diabetic chronic kidney disease: Secondary | ICD-10-CM | POA: Diagnosis not present

## 2022-06-21 DIAGNOSIS — K2970 Gastritis, unspecified, without bleeding: Secondary | ICD-10-CM | POA: Diagnosis not present

## 2022-06-21 DIAGNOSIS — N186 End stage renal disease: Secondary | ICD-10-CM | POA: Diagnosis not present

## 2022-06-21 DIAGNOSIS — R112 Nausea with vomiting, unspecified: Secondary | ICD-10-CM | POA: Diagnosis not present

## 2022-06-21 DIAGNOSIS — Z880 Allergy status to penicillin: Secondary | ICD-10-CM | POA: Diagnosis not present

## 2022-06-21 DIAGNOSIS — R634 Abnormal weight loss: Secondary | ICD-10-CM | POA: Diagnosis not present

## 2022-06-21 DIAGNOSIS — Z885 Allergy status to narcotic agent status: Secondary | ICD-10-CM | POA: Diagnosis not present

## 2022-06-21 DIAGNOSIS — K2950 Unspecified chronic gastritis without bleeding: Secondary | ICD-10-CM | POA: Diagnosis not present

## 2022-06-21 DIAGNOSIS — Z9049 Acquired absence of other specified parts of digestive tract: Secondary | ICD-10-CM | POA: Diagnosis not present

## 2022-06-21 DIAGNOSIS — Z9851 Tubal ligation status: Secondary | ICD-10-CM | POA: Diagnosis not present

## 2022-06-21 DIAGNOSIS — I120 Hypertensive chronic kidney disease with stage 5 chronic kidney disease or end stage renal disease: Secondary | ICD-10-CM | POA: Diagnosis not present

## 2022-06-22 ENCOUNTER — Other Ambulatory Visit (INDEPENDENT_AMBULATORY_CARE_PROVIDER_SITE_OTHER): Payer: Medicare Other

## 2022-06-22 DIAGNOSIS — N3001 Acute cystitis with hematuria: Secondary | ICD-10-CM

## 2022-06-22 LAB — POCT URINALYSIS DIPSTICK
Blood, UA: NEGATIVE
Glucose, UA: NEGATIVE
Ketones, UA: NEGATIVE
Leukocytes, UA: NEGATIVE
Nitrite, UA: NEGATIVE
Protein, UA: POSITIVE — AB
Spec Grav, UA: >=1.03 — AB (ref 1.010–1.025)
Urobilinogen, UA: 0.2 E.U./dL
pH, UA: 5.5 (ref 5.0–8.0)

## 2022-06-23 NOTE — Telephone Encounter (Signed)
error 

## 2022-06-28 ENCOUNTER — Encounter: Payer: Self-pay | Admitting: Nurse Practitioner

## 2022-06-28 ENCOUNTER — Ambulatory Visit (INDEPENDENT_AMBULATORY_CARE_PROVIDER_SITE_OTHER): Payer: Medicare Other | Admitting: Nurse Practitioner

## 2022-06-28 VITALS — BP 138/80 | HR 64 | Ht 63.0 in | Wt 123.8 lb

## 2022-06-28 DIAGNOSIS — N1832 Chronic kidney disease, stage 3b: Secondary | ICD-10-CM

## 2022-06-28 DIAGNOSIS — Z08 Encounter for follow-up examination after completed treatment for malignant neoplasm: Secondary | ICD-10-CM | POA: Diagnosis not present

## 2022-06-28 DIAGNOSIS — D2272 Melanocytic nevi of left lower limb, including hip: Secondary | ICD-10-CM | POA: Diagnosis not present

## 2022-06-28 DIAGNOSIS — L570 Actinic keratosis: Secondary | ICD-10-CM | POA: Diagnosis not present

## 2022-06-28 DIAGNOSIS — N184 Chronic kidney disease, stage 4 (severe): Secondary | ICD-10-CM

## 2022-06-28 DIAGNOSIS — L821 Other seborrheic keratosis: Secondary | ICD-10-CM | POA: Diagnosis not present

## 2022-06-28 DIAGNOSIS — D225 Melanocytic nevi of trunk: Secondary | ICD-10-CM | POA: Diagnosis not present

## 2022-06-28 DIAGNOSIS — D2262 Melanocytic nevi of left upper limb, including shoulder: Secondary | ICD-10-CM | POA: Diagnosis not present

## 2022-06-28 DIAGNOSIS — Z794 Long term (current) use of insulin: Secondary | ICD-10-CM

## 2022-06-28 DIAGNOSIS — C4441 Basal cell carcinoma of skin of scalp and neck: Secondary | ICD-10-CM | POA: Diagnosis not present

## 2022-06-28 DIAGNOSIS — D485 Neoplasm of uncertain behavior of skin: Secondary | ICD-10-CM | POA: Diagnosis not present

## 2022-06-28 DIAGNOSIS — E1122 Type 2 diabetes mellitus with diabetic chronic kidney disease: Secondary | ICD-10-CM | POA: Diagnosis not present

## 2022-06-28 DIAGNOSIS — D2271 Melanocytic nevi of right lower limb, including hip: Secondary | ICD-10-CM | POA: Diagnosis not present

## 2022-06-28 DIAGNOSIS — D2261 Melanocytic nevi of right upper limb, including shoulder: Secondary | ICD-10-CM | POA: Diagnosis not present

## 2022-06-28 DIAGNOSIS — D0461 Carcinoma in situ of skin of right upper limb, including shoulder: Secondary | ICD-10-CM | POA: Diagnosis not present

## 2022-06-28 DIAGNOSIS — Z85828 Personal history of other malignant neoplasm of skin: Secondary | ICD-10-CM | POA: Diagnosis not present

## 2022-06-28 MED ORDER — BASAGLAR KWIKPEN 100 UNIT/ML ~~LOC~~ SOPN: 6.0000 [IU] | PEN_INJECTOR | Freq: Every day | SUBCUTANEOUS | 0 refills | Status: DC

## 2022-06-28 NOTE — Patient Instructions (Signed)

## 2022-06-28 NOTE — Progress Notes (Signed)
Endocrinology Consult Note       06/28/2022, 1:00 PM   Subjective:    Patient ID: Kelly [name], female    DOB: 1941-02-08.  Kelly [name] is being seen in consultation for management of currently uncontrolled symptomatic diabetes requested by  [name], MD.   Past Medical History:  Diagnosis Date   Colon polyps    Depression    DM (diabetes mellitus), type 2 with renal complications ([place])    Hyperlipidemia    Hypertension    Kidney disease    Kidney stones    Shingles 12/05/2019   Sleep apnea    Squamous cell skin cancer 12/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     Past Surgical History:  Procedure Laterality Date   APPENDECTOMY     BREAST BIOPSY Right 06/19/2018   affirm stereo/x clip/COLUMNAR CELL CHANGE WITH MICROCALCIFICATIONS. FIBROCYSTIC CHANGES WITH MICROCALCIFICATIONS   cataract  03/2017   CHOLECYSTECTOMY     COLONOSCOPY     COLONOSCOPY WITH PROPOFOL N/A 08/22/2017   Procedure: COLONOSCOPY WITH PROPOFOL;  Surgeon: Jonathon Bellows, MD;  Location: [facility] ENDOSCOPY;  Service: Gastroenterology;  Laterality: N/A;   ESOPHAGOGASTRODUODENOSCOPY     ESOPHAGOGASTRODUODENOSCOPY (EGD) WITH PROPOFOL N/A 03/23/2016   Procedure: ESOPHAGOGASTRODUODENOSCOPY (EGD) WITH PROPOFOL;  Surgeon: Manya Silvas, MD;  Location: [facility] ENDOSCOPY;  Service: Endoscopy;  Laterality: N/A;   FLEXIBLE SIGMOIDOSCOPY N/A 02/06/2018   Procedure: FLEXIBLE SIGMOIDOSCOPY;  Surgeon: Jonathon Bellows, MD;  Location: [facility] ENDOSCOPY;  Service: Gastroenterology;  Laterality: N/A;   FLEXIBLE SIGMOIDOSCOPY N/A 12/03/2018   Procedure: FLEXIBLE SIGMOIDOSCOPY;  Surgeon: Jonathon Bellows, MD;  Location: [facility] ENDOSCOPY;  Service: Gastroenterology;  Laterality: N/A;   RECTAL PROLAPSE REPAIR  2012   x2    Social History   Socioeconomic History   Marital status: Widowed    Spouse name: Not on file   Number of children: Not on file    Years of education: Not on file   Highest education level: High school graduate  Occupational History   Occupation: retired   Tobacco Use   Smoking status: Never   Smokeless tobacco: Never  Vaping Use   Vaping Use: Never used  Substance and Sexual Activity   Alcohol use: No   Drug use: No   Sexual activity: Not Currently    Partners: Male  Other Topics Concern   Not on file  Social History Narrative   Not on file   Social Determinants of Health   Financial Resource Strain: Low Risk  (06/10/2021)   Overall Financial Resource Strain (CARDIA)    Difficulty of Paying Living Expenses: Not hard at all  Food Insecurity: No Food Insecurity (05/30/2022)   Hunger Vital Sign    Worried About Running Out of Food in the Last Year: Never true    [place] in the Last Year: Never true  Transportation Needs: No Transportation Needs (05/30/2022)   PRAPARE - Hydrologist (Medical): No    Lack of Transportation (Non-Medical): No  Physical Activity: Insufficiently Active (11/03/2020)   Exercise Vital Sign    Days of Exercise per Week: 3 days    Minutes of Exercise  per Session: 20 min  Stress: No Stress Concern Present (11/03/2020)   [place]    Feeling of Stress : Only a little  Social Connections: Unknown (11/03/2020)   Social Connection and Isolation Panel [NHANES]    Frequency of Communication with Friends and Family: More than three times a week    Frequency of Social Gatherings with Friends and Family: Not on file    Attends Religious Services: Not on file    Active Member of Clubs or Organizations: Not on file    Attends Archivist Meetings: Not on file    Marital Status: Not on file    Family History  Problem Relation Age of Onset   Stroke Mother    Arthritis Mother    Aortic aneurysm Sister    Lung cancer Sister    Heart attack Father 47   Breast cancer Sister 70     Outpatient Encounter Medications as of 06/28/2022  Medication Sig   amLODipine (NORVASC) 10 MG tablet Take 1 tablet (10 mg total) by mouth daily.   aspirin EC 81 MG tablet Take 81 mg by mouth daily.   calcitRIOL (ROCALTROL) 0.25 MCG capsule Take 0.25 mcg by mouth daily.   Calcium Citrate-Vitamin D 315-5 MG-MCG TABS Take 1 tablet by mouth daily.   Cholecalciferol (VITAMIN D) 50 MCG (2000 UT) CAPS Take 2,000 Units by mouth daily.    Continuous Blood Gluc Sensor (FREESTYLE LIBRE 2 SENSOR) MISC USE TO CHECK SUGAR AT LEAST4 TIMES A DAY   cyclobenzaprine (FLEXERIL) 10 MG tablet TAKE 1/2 TABLET (5MG TOTAL)3 TIMES A DAY AS NEEDED    FOR MUSCLE SPASMS   diclofenac (FLECTOR) 1.3 % PTCH 1 patch 2 (two) times daily.   furosemide (LASIX) 40 MG tablet Take 40 mg by mouth daily as needed.   glucosamine-chondroitin 500-400 MG tablet Take 1 tablet by mouth 3 (three) times daily.   glucose blood (ACCU-CHEK AVIVA PLUS) test strip Use to check blood sugar up to 4 times daily   HYDROcodone-acetaminophen (NORCO/VICODIN) 5-325 MG tablet Take 1 tablet by mouth every 4 (four) hours as needed.   losartan (COZAAR) 50 MG tablet TAKE 1 TABLET DAILY   Melatonin 5 MG CHEW Chew 5 mg by mouth at bedtime as needed.   potassium chloride (K-DUR) 10 MEQ tablet Take 10 mEq by mouth daily as needed.   rosuvastatin (CRESTOR) 10 MG tablet Take 1 tablet (10 mg total) by mouth daily.   sertraline (ZOLOFT) 50 MG tablet Take 1 tablet (50 mg total) by mouth daily.   [DISCONTINUED] Insulin Glargine (BASAGLAR KWIKPEN) 100 UNIT/ML INJECT 20 UNITS TOTAL      SUBCUTANEOUSLY DAILY. (Patient taking differently: 8 Units daily.)   [DISCONTINUED] metoCLOPramide (REGLAN) 10 MG tablet Take 1 tablet (10 mg total) by mouth every 6 (six) hours as needed for nausea or vomiting.   Insulin Glargine (BASAGLAR KWIKPEN) 100 UNIT/ML Inject 6 Units into the skin at bedtime.   Insulin Pen Needle (BD PEN NEEDLE NANO 2ND GEN) 32G X 4 MM MISC USE DAILY WITH  VICTOZA AND BASAGLAR   Lancets MISC Use up to 4 times daily to check blood sugars.   [DISCONTINUED] gabapentin (NEURONTIN) 100 MG capsule Take 1 capsule (100 mg total) by mouth 3 (three) times daily. (Patient not taking: Reported on 06/28/2022)   [DISCONTINUED] liraglutide (VICTOZA) 18 MG/3ML SOPN Inject 1.8 mg into the skin daily. (Patient not taking: Reported on 06/01/2022)   Facility-Administered Encounter Medications  as of 06/28/2022  Medication   denosumab (PROLIA) injection 60 mg    ALLERGIES: Allergies  Allergen Reactions   Prilosec Otc [Omeprazole Magnesium] Other (See Comments)    Maybe cause of acute interstitial nephritis   Sucralfate Other (See Comments)    Maybe cause of acute interstitial nephritis   Amoxicillin Diarrhea   Morphine And Related Other (See Comments)    Chest pains Chest pains    VACCINATION STATUS: Immunization History  Administered Date(s) Administered   Fluad Quad(high Dose 65+) 02/25/2019, 03/02/2020, 05/17/2021, 04/08/2022   Influenza, High Dose Seasonal PF 02/24/2017, 02/28/2018   Influenza,inj,quad, With Preservative 02/19/2016   Moderna Sars-Covid-2 Vaccination 07/05/2019, 08/12/2019, 03/02/2020, 08/10/2020   PNEUMOCOCCAL CONJUGATE-20 07/02/2021   Td 02/26/2019   Zoster Recombinat (Shingrix) 01/02/2020, 03/10/2020    Diabetes She presents for her initial diabetic visit. She has type 2 diabetes mellitus. Onset time: diagnosed at approx age of 5. Her disease course has been improving. Hypoglycemia symptoms include nervousness/anxiousness, sweats and tremors. There are no hypoglycemic complications. Diabetic complications include nephropathy. (gastroparesis) Risk factors for coronary artery disease include diabetes mellitus and post-menopausal. Current diabetic treatment includes insulin injections and diet. She is compliant with treatment most of the time. Her home blood glucose trend is decreasing steadily. (She presents today for her consultation,  accompanied by her daughter, with her CGM showing at goal glycemic profile overall.  Her most recent A1c was 8.9% on 04/04/22.  She was recently taken off Victoza for problems with gastroparesis.  She mostly water, occasionally a coffee or apple juice (watered down) to help with her constipation, and on occasions when she eats out, she may have a soda.  She eats 2 meals per day with snacks in between.  Her daughter has been helping her more with her diet.  She stays active by walking her dog.  She is UTD on eye exam, has seen podiatry in the past.  Analysis of her CGM shows TIR 78%, TAR 22%, TBR 0% with a GMI of 6.9%.  She returns to her PCP in a few weeks to have her A1c rechecked.)     Review of systems  Constitutional: + Minimally fluctuating body weight, current Body mass index is 21.93 kg/m., no fatigue, no subjective hyperthermia, no subjective hypothermia Eyes: no blurry vision, no xerophthalmia ENT: no sore throat, no nodules palpated in throat, no dysphagia/odynophagia, no hoarseness Cardiovascular: no chest pain, no shortness of breath, no palpitations, no leg swelling Respiratory: no cough, no shortness of breath Gastrointestinal: no nausea/vomiting/diarrhea Musculoskeletal: no muscle/joint aches Skin: no rashes, no hyperemia Neurological: no tremors, no numbness, no tingling, no dizziness Psychiatric: no depression, no anxiety  Objective:     BP 138/80 (BP Location: Left Arm, Patient Position: Sitting, Cuff Size: Normal) Comment: Retake Manuel Cuff  Pulse 64   Ht 5' 3"$  (1.6 m)   Wt 123 lb 12.8 oz (56.2 kg)   BMI 21.93 kg/m   Wt Readings from Last 3 Encounters:  06/28/22 123 lb 12.8 oz (56.2 kg)  06/01/22 123 lb 12.8 oz (56.2 kg)  05/12/22 122 lb 6.4 oz (55.5 kg)     BP Readings from Last 3 Encounters:  06/28/22 138/80  06/01/22 120/70  05/12/22 130/70     Physical Exam- Limited  Constitutional:  Body mass index is 21.93 kg/m. , not in acute distress, normal state  of mind Eyes:  EOMI, no exophthalmos Neck: Supple Cardiovascular: RRR, no murmurs, rubs, or gallops, no edema Respiratory: Adequate breathing efforts, no crackles,  rales, rhonchi, or wheezing Musculoskeletal: no gross deformities, strength intact in all four extremities, no gross restriction of joint movements Skin:  no rashes, no hyperemia Neurological: no tremor with outstretched hands    CMP ( most recent) CMP     Component Value Date/Time   NA 142 04/26/2022 1823   NA 144 07/05/2018 0000   NA 137 01/18/2013 1036   K 3.7 04/26/2022 1823   K 3.9 01/18/2013 1036   CL 105 04/26/2022 1823   CL 103 01/18/2013 1036   CO2 29 04/26/2022 1823   CO2 26 01/18/2013 1036   GLUCOSE 89 04/26/2022 1823   GLUCOSE 342 (H) 01/18/2013 1036   BUN 26 (H) 04/26/2022 1823   BUN 35 (A) 07/05/2018 0000   BUN 14 01/18/2013 1036   CREATININE 1.77 (H) 04/26/2022 1823   CREATININE 1.48 (H) 01/18/2013 1036   CALCIUM 9.3 04/26/2022 1823   CALCIUM 9.3 01/18/2013 1036   PROT 7.2 04/26/2022 1823   PROT 6.2 (L) 01/18/2013 1036   ALBUMIN 3.8 04/26/2022 1823   ALBUMIN 3.3 (L) 01/18/2013 1036   AST 27 04/26/2022 1823   AST 22 01/18/2013 1036   ALT 20 04/26/2022 1823   ALT 46 01/18/2013 1036   ALKPHOS 71 04/26/2022 1823   ALKPHOS 120 01/18/2013 1036   BILITOT 0.8 04/26/2022 1823   BILITOT 0.6 01/18/2013 1036   GFRNONAA 29 (L) 04/26/2022 1823   GFRNONAA 35 (L) 01/18/2013 1036   GFRAA 19 (L) 10/20/2019 1735   GFRAA 41 (L) 01/18/2013 1036     Diabetic Labs (most recent): Lab Results  Component Value Date   HGBA1C 8.9 (A) 04/04/2022   HGBA1C 8.9 04/04/2022   HGBA1C 8.9 (A) 04/04/2022   HGBA1C 8.9 (A) 04/04/2022   MICROALBUR 4.5 (H) 08/02/2017     Lipid Panel ( most recent) Lipid Panel     Component Value Date/Time   CHOL 114 09/29/2021 1128   TRIG 86.0 09/29/2021 1128   HDL 39.90 09/29/2021 1128   CHOLHDL 3 09/29/2021 1128   VLDL 17.2 09/29/2021 1128   LDLCALC 57 09/29/2021 1128    LDLDIRECT 78.0 02/18/2019 0857      Lab Results  Component Value Date   TSH 4.41 11/08/2021   TSH 3.93 03/22/2017   TSH 0.719 07/09/2016           Assessment & Plan:   1) Type 2 diabetes mellitus with stage 3b chronic kidney disease, with long-term current use of insulin ([place])  She presents today for her consultation, accompanied by her daughter, with her CGM showing at goal glycemic profile overall.  Her most recent A1c was 8.9% on 04/04/22.  She was recently taken off Victoza for problems with gastroparesis.  She mostly water, occasionally a coffee or apple juice (watered down) to help with her constipation, and on occasions when she eats out, she may have a soda.  She eats 2 meals per day with snacks in between.  Her daughter has been helping her more with her diet.  She stays active by walking her dog.  She is UTD on eye exam, has seen podiatry in the past.  Analysis of her CGM shows TIR 78%, TAR 22%, TBR 0% with a GMI of 6.9%.  She returns to her PCP in a few weeks to have her A1c rechecked.  - Kelly [name] has currently uncontrolled symptomatic type 2 DM since 82 years of age, with most recent A1c of 8.9 %.   -Recent labs reviewed.  -  I had a long discussion with her about the progressive nature of diabetes and the pathology behind its complications. 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 These are all discussed in detail with her.  The following Lifestyle Medicine recommendations according to [facility]) were discussed and offered to patient and she agrees to start the journey:  A. Whole Foods, Plant-based plate comprising of fruits and vegetables, plant-based proteins, whole-grain carbohydrates was discussed in detail with the patient.   A list for source of those nutrients were also  provided to the patient.  Patient will use only water or unsweetened tea for hydration. B.  The need to stay away from risky substances including alcohol, smoking; obtaining 7 to 9 hours of restorative sleep, at least 150 minutes of moderate intensity exercise weekly, the importance of healthy social connections,  and stress reduction techniques were discussed. C.  A full color page of  Calorie density of various food groups per pound showing examples of each food groups was provided to the patient.  - I have counseled her on diet and weight management by adopting a carbohydrate restricted/protein rich diet. Patient is encouraged to switch to unprocessed or minimally processed complex starch and increased protein intake (animal or plant source), fruits, and vegetables. -  she is advised to stick to a routine mealtimes to eat 3 meals a day and avoid unnecessary snacks (to snack only to correct hypoglycemia).   - she acknowledges that there is a room for improvement in her food and drink choices. - Suggestion is made for her to avoid simple carbohydrates from her diet including Cakes, Sweet Desserts, Ice Cream, Soda (diet and regular), Sweet Tea, Candies, Chips, Cookies, Store Bought Juices, Alcohol in Excess of 1-2 drinks a day, Artificial Sweeteners, Coffee Creamer, and "Sugar-free" Products. This will help patient to have more stable blood glucose profile and potentially avoid unintended weight gain.  - I have approached her with the following individualized plan to manage her diabetes and patient agrees:   -Avoiding hypoglycemia is a top priority in her care.  A good goal A1c for her would be 7%. -She is advised to continue her Basaglar 6 units SQ daily in the morning (says she tends to forget if she is to take it in the evening).  -she is encouraged to continue monitoring glucose at least 2 times daily, before breakfast and before bed, and to call the clinic if she has readings less than 70 or  above 300 for 3 tests in a row.  - she is warned not to take insulin without proper monitoring per orders. - Adjustment parameters are given to her for hypo and hyperglycemia in writing.  -She is not an ideal candidate for incretin therapy due to body habitus with BMI of 21.  - Specific targets for  A1c; LDL, HDL, and Triglycerides were discussed with the patient.  2) Blood Pressure /Hypertension:  her blood pressure is controlled to target.   she is advised to continue her current medications including Losartan 50 mg p.o. daily with breakfast, and Norvasc 10 mg po daily.  She also takes Lasix 40 po daily as needed for fluid.  She does see nephrology.  3) Lipids/Hyperlipidemia:    Review of her recent lipid panel from 09/29/21 showed controlled LDL at 57 .  she is advised to  continue Crestor 10 mg daily at bedtime.  Side effects and precautions discussed with her.  4)  Weight/Diet:  her Body mass index is 21.93 kg/m.  -    she is NOT a candidate for weight loss.   Exercise, and detailed carbohydrates information provided  -  detailed on discharge instructions.  5) Chronic Care/Health Maintenance: -she is on ACEI/ARB and Statin medications and is encouraged to initiate and continue to follow up with Ophthalmology, Dentist, Podiatrist at least yearly or according to recommendations, and advised to stay away from smoking. I have recommended yearly flu vaccine and pneumonia vaccine at least every 5 years; moderate intensity exercise for up to 150 minutes weekly; and sleep for at least 7 hours a day.  - she is advised to maintain close follow up with Caryl Bis Angela Adam, MD for primary care needs, as well as her other providers for optimal and coordinated care.   - Time spent in this patient care: 60 min, of which > 50% was spent in counseling her about her diabetes and the rest reviewing her blood glucose logs, discussing her hypoglycemia and hyperglycemia episodes, reviewing her current and  previous labs/studies (including abstraction from other facilities) and medications doses and developing a long term treatment plan based on the latest standards of care/guidelines; and documenting her care.    Please refer to Patient Instructions for Blood Glucose Monitoring and Insulin/Medications Dosing Guide" in media tab for additional information. Please also refer to "Patient Self Inventory" in the Media tab for reviewed elements of pertinent patient history.  Kelly [name] participated in the discussions, expressed understanding, and voiced agreement with the above plans.  All questions were answered to her satisfaction. she is encouraged to contact clinic should she have any questions or concerns prior to her return visit.     Follow up plan: - Return in about 3 months (around 09/26/2022) for Diabetes F/U with A1c in office, No previsit labs, Bring meter and logs.    Rayetta Pigg, [facility] [address] [place], [place] [address] Phone: [phone] Fax: [phone]  06/28/2022, 1:00 PM

## 2022-07-01 ENCOUNTER — Encounter: Payer: Self-pay | Admitting: Emergency Medicine

## 2022-07-01 ENCOUNTER — Ambulatory Visit
Admit: 2022-07-01 | Discharge: 2022-07-01 | Disposition: A | Discharge status: Home / Self Care | Payer: Medicare Other | Attending: Emergency Medicine | Admitting: Emergency Medicine

## 2022-07-01 ENCOUNTER — Ambulatory Visit
Admission: EM | Admit: 2022-07-01 | Discharge: 2022-07-01 | Disposition: A | Discharge status: Home / Self Care | Payer: Medicare Other | Attending: Emergency Medicine | Admitting: Emergency Medicine

## 2022-07-01 ENCOUNTER — Ambulatory Visit (INDEPENDENT_AMBULATORY_CARE_PROVIDER_SITE_OTHER): Payer: Medicare Other

## 2022-07-01 DIAGNOSIS — M79605 Pain in left leg: Secondary | ICD-10-CM | POA: Insufficient documentation

## 2022-07-01 DIAGNOSIS — M79662 Pain in left lower leg: Secondary | ICD-10-CM | POA: Diagnosis not present

## 2022-07-01 NOTE — ED Provider Notes (Signed)
Kelly [name]    CSN: [id number] Arrival date & time: 07/01/22  1441      History   Chief Complaint Chief Complaint  Patient presents with   Leg Pain    HPI Kelly [name] is a 82 y.o. female.  Patient presents with 3 week history of left lower leg pain which started after she hit it on a wooden step.  She is concerned for a "cracked bone or blood clot."  The pain is worse with palpation, ambulation, weightbearing.  No wounds, bruising, redness, swelling, fever, chills, chest pain, shortness of breath, or other symptoms.  No OTC medications today.  Her medical history includes hypertension, diabetes, CKD.   The history is provided by the patient and medical records.    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     Patient Active Problem List   Diagnosis Date Noted   Acute cystitis with hematuria 05/27/2022   Acute kidney injury superimposed on chronic kidney disease ([place]) 05/26/2022   Gastroparesis 05/26/2022   Memory difficulty 05/12/2022   Leg swelling 12/01/2021   Arthralgia 11/08/2021   Skin tear of lower leg without complication, initial encounter 10/01/2021   Constipation 07/02/2021   Elevated lipase 07/02/2021   Thrombocytopenia ([place]) 07/02/2021   Hair loss 07/02/2021   Soft tissue lesion 07/02/2021   Anxiety 06/11/2021   Trochanteric bursitis, right hip 12/08/2020   Foraminal stenosis of lumbosacral region 12/08/2020   Lumbar herniated disc 12/08/2020   Lumbar radiculopathy 12/02/2020   Rash 09/29/2020   Onychomycosis 08/11/2020   Sleeping difficulty 08/11/2020   Allergic rhinitis 08/11/2020   CPAP use counseling 06/01/2020   Dysuria 03/02/2020   Edema of lower extremity  11/19/2019   Hyperlipidemia 11/15/2019   Low back pain 11/15/2019   Dry eyes 10/01/2019   Tick bite 10/01/2019   Personal history of other specified conditions 07/03/2019   Fall 04/15/2019   Type 2 diabetes mellitus ([place]) 03/18/2019   Hypertension 03/18/2019   Hyperparathyroidism due to renal insufficiency ([place]) 03/18/2019   Headache disorder 09/10/2018   Cervico-occipital neuralgia 07/23/2018   Osteoporosis 07/23/2018   Cramp and spasm 02/12/2018   Paresthesia of hand 10/25/2017   Lesion of rectum 09/07/2017   Loss of height 08/15/2017   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   Cervical spondylosis without myelopathy 08/15/2017   DDD (degenerative disc disease), cervical 08/15/2017   Bursitis of hip 08/15/2017   Female stress incontinence 08/15/2017   Muscle weakness 08/15/2017   Neck pain 08/15/2017   Spondylolisthesis, congenital 08/15/2017   Radial styloid tenosynovitis 08/15/2017   Senile osteoporosis 08/15/2017   Hip pain 08/15/2017   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   Rectal hemorrhage 07/25/2017   Essential tremor 04/26/2017   Other fatigue 03/22/2017   Left ventricular hypertrophy 10/12/2016   Chronic kidney disease, stage 4 (severe) ([place]) 08/26/2016   OSA on CPAP 08/26/2016    Past Surgical History:  Procedure Laterality Date   APPENDECTOMY     BREAST BIOPSY Right 06/19/2018   affirm stereo/x clip/COLUMNAR CELL CHANGE WITH MICROCALCIFICATIONS. FIBROCYSTIC CHANGES WITH MICROCALCIFICATIONS   cataract  03/2017   CHOLECYSTECTOMY  COLONOSCOPY     COLONOSCOPY WITH PROPOFOL N/A 08/22/2017   Procedure: COLONOSCOPY WITH PROPOFOL;  Surgeon: Jonathon Bellows, MD;  Location: [facility] ENDOSCOPY;  Service: Gastroenterology;  Laterality: N/A;   ESOPHAGOGASTRODUODENOSCOPY     ESOPHAGOGASTRODUODENOSCOPY (EGD) WITH PROPOFOL N/A 03/23/2016   Procedure: ESOPHAGOGASTRODUODENOSCOPY (EGD) WITH PROPOFOL;  Surgeon: Manya Silvas, MD;  Location: [facility] ENDOSCOPY;  Service:  Endoscopy;  Laterality: N/A;   FLEXIBLE SIGMOIDOSCOPY N/A 02/06/2018   Procedure: FLEXIBLE SIGMOIDOSCOPY;  Surgeon: Jonathon Bellows, MD;  Location: [facility] ENDOSCOPY;  Service: Gastroenterology;  Laterality: N/A;   FLEXIBLE SIGMOIDOSCOPY N/A 12/03/2018   Procedure: FLEXIBLE SIGMOIDOSCOPY;  Surgeon: Jonathon Bellows, MD;  Location: [facility] ENDOSCOPY;  Service: Gastroenterology;  Laterality: N/A;   RECTAL PROLAPSE REPAIR  2012   x2    OB History   No obstetric history on file.      Home Medications    Prior to Admission medications   Medication Sig Start Date End Date Taking? Authorizing Provider  amLODipine (NORVASC) 10 MG tablet Take 1 tablet (10 mg total) by mouth daily. 05/12/22   [name], MD  aspirin EC 81 MG tablet Take 81 mg by mouth daily.    [provider]  calcitRIOL (ROCALTROL) 0.25 MCG capsule Take 0.25 mcg by mouth daily.    [provider]  Calcium Citrate-Vitamin D 315-5 MG-MCG TABS Take 1 tablet by mouth daily.    [provider]  Cholecalciferol (VITAMIN D) 50 MCG (2000 UT) CAPS Take 2,000 Units by mouth daily.     [provider]  Continuous Blood Gluc Sensor (FREESTYLE LIBRE 2 SENSOR) MISC USE TO CHECK SUGAR AT LEAST4 TIMES A DAY 09/27/21   [name], MD  cyclobenzaprine (FLEXERIL) 10 MG tablet TAKE 1/2 TABLET ('5MG'$  TOTAL)3 TIMES A DAY AS NEEDED    FOR MUSCLE SPASMS 01/28/20   [name], MD  diclofenac (FLECTOR) 1.3 % PTCH 1 patch 2 (two) times daily. 04/13/22   [provider]  furosemide (LASIX) 40 MG tablet Take 40 mg by mouth daily as needed.    [provider]  glucosamine-chondroitin 500-400 MG tablet Take 1 tablet by mouth 3 (three) times daily.    [provider]  glucose blood (ACCU-CHEK AVIVA PLUS) test strip Use to check blood sugar up to 4 times daily 07/30/18   [name], MD  HYDROcodone-acetaminophen (NORCO/VICODIN) 5-325 MG tablet Take 1 tablet by mouth every 4 (four) hours as  needed.    [provider]  Insulin Glargine (BASAGLAR KWIKPEN) 100 UNIT/ML Inject 6 Units into the skin at bedtime. 06/28/22   Brita Romp, NP  Insulin Pen Needle (BD PEN NEEDLE NANO 2ND GEN) 32G X 4 MM MISC USE DAILY WITH VICTOZA AND BASAGLAR 05/12/22   [name], MD  Lancets MISC Use up to 4 times daily to check blood sugars. 11/04/16   Coral Spikes, DO  losartan (COZAAR) 50 MG tablet TAKE 1 TABLET DAILY 02/24/22   [name], MD  Melatonin 5 MG CHEW Chew 5 mg by mouth at bedtime as needed.    [provider]  potassium chloride (K-DUR) 10 MEQ tablet Take 10 mEq by mouth daily as needed.    [provider]  rosuvastatin (CRESTOR) 10 MG tablet Take 1 tablet (10 mg total) by mouth daily. 05/12/22   [name], MD  sertraline (ZOLOFT) 50 MG tablet Take 1 tablet (50 mg total) by mouth daily. 09/03/21   [name], MD  Family History Family History  Problem Relation Age of Onset   Stroke Mother    Arthritis Mother    Aortic aneurysm Sister    Lung cancer Sister    Heart attack Father 46   Breast cancer Sister 19    Social History Social History   Tobacco Use   Smoking status: Never   Smokeless tobacco: Never  Vaping Use   Vaping Use: Never used  Substance Use Topics   Alcohol use: No   Drug use: No     Allergies   Prilosec otc [omeprazole magnesium], Sucralfate, Amoxicillin, and Morphine and related   Review of Systems Review of Systems  Constitutional:  Negative for chills and fever.  Respiratory:  Negative for cough and shortness of breath.   Cardiovascular:  Negative for chest pain and palpitations.  Musculoskeletal:  Negative for arthralgias, gait problem and joint swelling.  Skin:  Negative for color change, rash and wound.  Neurological:  Negative for numbness.  All other systems reviewed and are negative.    Physical Exam Triage Vital Signs ED Triage Vitals  Enc Vitals Group     BP      Pulse       Resp      Temp      Temp src      SpO2      Weight      Height      Head Circumference      Peak Flow      Pain Score      Pain Loc      Pain Edu?      Excl. in [place]?    No data found.  Updated Vital Signs BP (!) 155/72   Pulse 79   Temp 97.8 F (36.6 C)   Resp 18   SpO2 95%   Visual Acuity Right Eye Distance:   Left Eye Distance:   Bilateral Distance:    Right Eye Near:   Left Eye Near:    Bilateral Near:     Physical Exam Vitals and nursing note reviewed.  Constitutional:      General: She is not in acute distress.    Appearance: Normal appearance. She is well-developed. She is not ill-appearing.  HENT:     Mouth/Throat:     Mouth: Mucous membranes are moist.  Cardiovascular:     Rate and Rhythm: Normal rate and regular rhythm.     Heart sounds: Normal heart sounds.  Pulmonary:     Effort: Pulmonary effort is normal. No respiratory distress.     Breath sounds: Normal breath sounds.  Musculoskeletal:        General: Tenderness present. No swelling or deformity. Normal range of motion.     Cervical back: Neck supple.       Legs:  Skin:    General: Skin is warm and dry.     Capillary Refill: Capillary refill takes less than 2 seconds.     Findings: No bruising, erythema, lesion or rash.  Neurological:     General: No focal deficit present.     Mental Status: She is alert and oriented to person, place, and time.     Sensory: No sensory deficit.     Motor: No weakness.     Gait: Gait normal.  Psychiatric:        Mood and Affect: Mood normal.        Behavior: Behavior normal.      UC Treatments / Results  Labs (all labs ordered are listed, but only abnormal results are displayed) Labs Reviewed - No data to display  EKG   Radiology US Venous Img Lower Unilateral Left (DVT)  Result Date: 07/01/2022 CLINICAL DATA:  Left lower extremity pain for 3 weeks. EXAM: Left LOWER EXTREMITY VENOUS DOPPLER ULTRASOUND TECHNIQUE: Gray-scale sonography with  compression, as well as color and duplex ultrasound, were performed to evaluate the deep venous system(s) from the level of the common femoral vein through the popliteal and proximal calf veins. COMPARISON:  None Available. FINDINGS: VENOUS Normal compressibility of the common femoral, superficial femoral, and popliteal veins, as well as the visualized calf veins. Visualized portions of profunda femoral vein and great saphenous vein unremarkable. No filling defects to suggest DVT on grayscale or color Doppler imaging. Doppler waveforms show normal direction of venous flow, normal respiratory plasticity and response to augmentation. Limited views of the contralateral common femoral vein are unremarkable. OTHER Images of the area of pain in the lateral left calf demonstrate no mass or fluid collection. No focal abnormality. Limitations: none IMPRESSION: No evidence of acute deep venous thrombosis in the visualized lower extremity veins. Electronically Signed   By: Lucienne Capers M.D.   On: 07/01/2022 16:55   DG Tibia/Fibula Left  Result Date: 07/01/2022 CLINICAL DATA:  LEFT lower leg pain injury 3 weeks ago knocked over by a large dog and struck leg on corner of a step EXAM: LEFT TIBIA AND FIBULA - 2 VIEW COMPARISON:  LEFT ankle radiographs 06/21/2015 FINDINGS: Osseous demineralization. Knee and ankle joint alignments normal. No acute fracture, dislocation, or bone destruction. Question mild pretibial soft tissue swelling. IMPRESSION: No acute osseous abnormalities. Electronically Signed   By: Lavonia Dana M.D.   On: 07/01/2022 15:20    Procedures Procedures (including critical care time)  Medications Ordered in UC Medications - No data to display  Initial Impression / Assessment and Plan / UC Course  I have reviewed the triage vital signs and the nursing notes.  Pertinent labs & imaging results that were available during my care of the patient were reviewed by me and considered in my medical decision  making (see chart for details).    Left lower leg pain.  Xray of left tibia/fibula negative.  Ultrasound for DVT negative.  Discussed results with patient.  Discussed symptomatic care including Tylenol as needed.  Instructed patient to follow up with her PCP if her symptoms are not improving.  She agrees to plan of care.    Final Clinical Impressions(s) / UC Diagnoses   Final diagnoses:  Left leg pain     Discharge Instructions      Go for the ultrasound of your left leg.  I will call you with the results this afternoon.         ED Prescriptions   None    PDMP not reviewed this encounter.   Sharion Balloon, NP 07/01/22 [phone]

## 2022-07-01 NOTE — Discharge Instructions (Signed)
Go for the ultrasound of your left leg.  I will call you with the results this afternoon.

## 2022-07-01 NOTE — ED Notes (Signed)
Provider triage  

## 2022-07-04 DIAGNOSIS — N2581 Secondary hyperparathyroidism of renal origin: Secondary | ICD-10-CM | POA: Diagnosis not present

## 2022-07-04 DIAGNOSIS — I10 Essential (primary) hypertension: Secondary | ICD-10-CM | POA: Diagnosis not present

## 2022-07-04 DIAGNOSIS — E1122 Type 2 diabetes mellitus with diabetic chronic kidney disease: Secondary | ICD-10-CM | POA: Diagnosis not present

## 2022-07-04 DIAGNOSIS — N1832 Chronic kidney disease, stage 3b: Secondary | ICD-10-CM | POA: Diagnosis not present

## 2022-07-06 DIAGNOSIS — N1832 Chronic kidney disease, stage 3b: Secondary | ICD-10-CM | POA: Diagnosis not present

## 2022-07-06 DIAGNOSIS — N2581 Secondary hyperparathyroidism of renal origin: Secondary | ICD-10-CM | POA: Diagnosis not present

## 2022-07-06 DIAGNOSIS — I10 Essential (primary) hypertension: Secondary | ICD-10-CM | POA: Diagnosis not present

## 2022-07-06 DIAGNOSIS — E1122 Type 2 diabetes mellitus with diabetic chronic kidney disease: Secondary | ICD-10-CM | POA: Diagnosis not present

## 2022-07-07 ENCOUNTER — Other Ambulatory Visit (INDEPENDENT_AMBULATORY_CARE_PROVIDER_SITE_OTHER): Payer: Medicare Other

## 2022-07-07 DIAGNOSIS — E785 Hyperlipidemia, unspecified: Secondary | ICD-10-CM | POA: Diagnosis not present

## 2022-07-07 DIAGNOSIS — N184 Chronic kidney disease, stage 4 (severe): Secondary | ICD-10-CM | POA: Diagnosis not present

## 2022-07-07 DIAGNOSIS — E1122 Type 2 diabetes mellitus with diabetic chronic kidney disease: Secondary | ICD-10-CM | POA: Diagnosis not present

## 2022-07-07 DIAGNOSIS — Z794 Long term (current) use of insulin: Secondary | ICD-10-CM

## 2022-07-07 LAB — HEMOGLOBIN A1C: Hgb A1c MFr Bld: 7 % — ABNORMAL HIGH (ref 4.6–6.5)

## 2022-07-07 LAB — HEPATIC FUNCTION PANEL
ALT: 38 U/L — ABNORMAL HIGH (ref 0–35)
AST: 35 U/L (ref 0–37)
Albumin: 4 g/dL (ref 3.5–5.2)
Alkaline Phosphatase: 54 U/L (ref 39–117)
Bilirubin, Direct: 0.1 mg/dL (ref 0.0–0.3)
Total Bilirubin: 0.6 mg/dL (ref 0.2–1.2)
Total Protein: 6.6 g/dL (ref 6.0–8.3)

## 2022-07-07 LAB — LIPID PANEL
Cholesterol: 138 mg/dL (ref 0–200)
HDL: 42.2 mg/dL (ref >39.00)
LDL Cholesterol: 74 mg/dL (ref 0–99)
NonHDL: 95.62
Total CHOL/HDL Ratio: 3
Triglycerides: 109 mg/dL (ref 0.0–149.0)
VLDL: 21.8 mg/dL (ref 0.0–40.0)

## 2022-07-11 ENCOUNTER — Telehealth: Payer: Self-pay | Admitting: Family Medicine

## 2022-07-11 ENCOUNTER — Ambulatory Visit: Payer: Medicare Other | Admitting: Family Medicine

## 2022-07-11 NOTE — Telephone Encounter (Signed)
Pt would like a copy of her recent lab results. She will come by to pick them up

## 2022-07-12 ENCOUNTER — Other Ambulatory Visit: Payer: Self-pay

## 2022-07-12 DIAGNOSIS — R748 Abnormal levels of other serum enzymes: Secondary | ICD-10-CM

## 2022-07-13 DIAGNOSIS — M461 Sacroiliitis, not elsewhere classified: Secondary | ICD-10-CM | POA: Diagnosis not present

## 2022-07-13 DIAGNOSIS — M5136 Other intervertebral disc degeneration, lumbar region: Secondary | ICD-10-CM | POA: Diagnosis not present

## 2022-07-13 DIAGNOSIS — M5032 Other cervical disc degeneration, mid-cervical region, unspecified level: Secondary | ICD-10-CM | POA: Diagnosis not present

## 2022-07-13 DIAGNOSIS — E1165 Type 2 diabetes mellitus with hyperglycemia: Secondary | ICD-10-CM | POA: Diagnosis not present

## 2022-07-13 DIAGNOSIS — M6281 Muscle weakness (generalized): Secondary | ICD-10-CM | POA: Diagnosis not present

## 2022-07-13 DIAGNOSIS — M810 Age-related osteoporosis without current pathological fracture: Secondary | ICD-10-CM | POA: Diagnosis not present

## 2022-07-13 DIAGNOSIS — M47812 Spondylosis without myelopathy or radiculopathy, cervical region: Secondary | ICD-10-CM | POA: Diagnosis not present

## 2022-07-13 DIAGNOSIS — Q762 Congenital spondylolisthesis: Secondary | ICD-10-CM | POA: Diagnosis not present

## 2022-07-13 DIAGNOSIS — Z79899 Other long term (current) drug therapy: Secondary | ICD-10-CM | POA: Diagnosis not present

## 2022-07-13 DIAGNOSIS — E559 Vitamin D deficiency, unspecified: Secondary | ICD-10-CM | POA: Diagnosis not present

## 2022-07-13 DIAGNOSIS — M533 Sacrococcygeal disorders, not elsewhere classified: Secondary | ICD-10-CM | POA: Diagnosis not present

## 2022-07-13 NOTE — Telephone Encounter (Signed)
Lab results taken to the front desk for pickup.  Driana Dazey,cma

## 2022-07-18 ENCOUNTER — Ambulatory Visit: Payer: Medicare Other | Admitting: Family Medicine

## 2022-07-21 ENCOUNTER — Other Ambulatory Visit: Payer: Self-pay

## 2022-07-21 DIAGNOSIS — E1122 Type 2 diabetes mellitus with diabetic chronic kidney disease: Secondary | ICD-10-CM

## 2022-07-21 MED ORDER — FREESTYLE LIBRE 2 SENSOR MISC: 3 refills | Status: DC

## 2022-07-26 ENCOUNTER — Other Ambulatory Visit: Payer: Self-pay | Admitting: *Deleted

## 2022-07-26 DIAGNOSIS — F339 Major depressive disorder, recurrent, unspecified: Secondary | ICD-10-CM

## 2022-07-26 DIAGNOSIS — F419 Anxiety disorder, unspecified: Secondary | ICD-10-CM

## 2022-07-26 MED ORDER — SERTRALINE HCL 50 MG PO TABS: 50.0000 mg | ORAL_TABLET | Freq: Every day | ORAL | 2 refills | Status: DC

## 2022-07-26 NOTE — Telephone Encounter (Signed)
LOV: 1.24.24 NOV: 4.29.24

## 2022-07-27 DIAGNOSIS — L814 Other melanin hyperpigmentation: Secondary | ICD-10-CM | POA: Diagnosis not present

## 2022-07-27 DIAGNOSIS — L578 Other skin changes due to chronic exposure to nonionizing radiation: Secondary | ICD-10-CM | POA: Diagnosis not present

## 2022-07-27 DIAGNOSIS — C4441 Basal cell carcinoma of skin of scalp and neck: Secondary | ICD-10-CM | POA: Diagnosis not present

## 2022-07-27 DIAGNOSIS — L988 Other specified disorders of the skin and subcutaneous tissue: Secondary | ICD-10-CM | POA: Diagnosis not present

## 2022-08-01 ENCOUNTER — Telehealth: Payer: Self-pay | Admitting: Family Medicine

## 2022-08-01 NOTE — Telephone Encounter (Signed)
Patient called and said her mail order pharmacy never received the prescription for sertraline (ZOLOFT) 50 MG tablet, please resend.

## 2022-08-02 ENCOUNTER — Telehealth: Payer: Self-pay | Admitting: Family Medicine

## 2022-08-02 DIAGNOSIS — F419 Anxiety disorder, unspecified: Secondary | ICD-10-CM

## 2022-08-02 DIAGNOSIS — F339 Major depressive disorder, recurrent, unspecified: Secondary | ICD-10-CM

## 2022-08-02 NOTE — Telephone Encounter (Signed)
Prescription Request  08/02/2022  LOV: 06/01/2022  What is the name of the medication or equipment? sertraline (ZOLOFT) 50 MG tablet  Have you contacted your pharmacy to request a refill? Yes   Which pharmacy would you like this sent to?    [place], [place] - [address] [place] [address] Phone: [phone] Fax: [phone]    Patient notified that their request is being sent to the clinical staff for review and that they should receive a response within 2 business days.   Please advise at Mobile [phone] (mobile)   As per pt, she would like to have 2 weeks supply

## 2022-08-03 ENCOUNTER — Other Ambulatory Visit: Payer: Self-pay

## 2022-08-03 DIAGNOSIS — F419 Anxiety disorder, unspecified: Secondary | ICD-10-CM

## 2022-08-03 MED ORDER — SERTRALINE HCL 50 MG PO TABS: 50.0000 mg | ORAL_TABLET | Freq: Every day | ORAL | 0 refills | Status: DC

## 2022-08-03 NOTE — Telephone Encounter (Signed)
Pt called back and said to go ahead and call in the Zoloft. Gae Bon CMA its aware.

## 2022-08-03 NOTE — Progress Notes (Signed)
Patient called and stated that her mail order never received her prescription for the Zoloft 50 mg, I called [facility] and verified that they never received the prescription on 07/26/2022 and I verbally gave the prescription to the pharmacist over the telephone.  Patient wanted me to send in 7 tablets becaseu she is out of the medication to her local [facility] and I sent in 7 tablets until her medication arrives to her home.  Mara Favero,cma

## 2022-08-03 NOTE — Telephone Encounter (Signed)
Patient called about her sertraline (ZOLOFT) 50 MG tablet. Patient called pharmacy and they never received the refill request. Please resend . Patient wanted office to have this phone number to call in refill to [phone], this will go straight to a rep.  Patient is asking for a 2 week supply of Zoloft until she receives her medication through Pharmacy Mail. Please send the 2 week supply to [facility] in [place].

## 2022-08-04 MED ORDER — SERTRALINE HCL 50 MG PO TABS: 50.0000 mg | ORAL_TABLET | Freq: Every day | ORAL | 0 refills | Status: DC

## 2022-08-04 NOTE — Telephone Encounter (Signed)
Medication has been refilled.

## 2022-08-04 NOTE — Telephone Encounter (Signed)
Medication refilled

## 2022-08-18 DIAGNOSIS — D0461 Carcinoma in situ of skin of right upper limb, including shoulder: Secondary | ICD-10-CM | POA: Diagnosis not present

## 2022-08-18 DIAGNOSIS — L570 Actinic keratosis: Secondary | ICD-10-CM | POA: Diagnosis not present

## 2022-08-24 ENCOUNTER — Other Ambulatory Visit: Payer: Self-pay | Admitting: Family Medicine

## 2022-08-24 DIAGNOSIS — E785 Hyperlipidemia, unspecified: Secondary | ICD-10-CM

## 2022-08-24 DIAGNOSIS — I10 Essential (primary) hypertension: Secondary | ICD-10-CM

## 2022-08-24 MED ORDER — AMLODIPINE BESYLATE 10 MG PO TABS: 10.0000 mg | ORAL_TABLET | Freq: Every day | ORAL | 3 refills | Status: DC

## 2022-08-24 MED ORDER — ROSUVASTATIN CALCIUM 10 MG PO TABS: 10.0000 mg | ORAL_TABLET | Freq: Every day | ORAL | 3 refills | Status: DC

## 2022-08-29 ENCOUNTER — Telehealth: Payer: Self-pay

## 2022-08-29 ENCOUNTER — Other Ambulatory Visit (INDEPENDENT_AMBULATORY_CARE_PROVIDER_SITE_OTHER): Payer: Medicare Other

## 2022-08-29 DIAGNOSIS — R748 Abnormal levels of other serum enzymes: Secondary | ICD-10-CM | POA: Diagnosis not present

## 2022-08-29 LAB — HEPATIC FUNCTION PANEL
ALT: 68 U/L — ABNORMAL HIGH (ref 0–35)
AST: 31 U/L (ref 0–37)
Albumin: 4.1 g/dL (ref 3.5–5.2)
Alkaline Phosphatase: 84 U/L (ref 39–117)
Bilirubin, Direct: 0.1 mg/dL (ref 0.0–0.3)
Total Bilirubin: 0.5 mg/dL (ref 0.2–1.2)
Total Protein: 6.5 g/dL (ref 6.0–8.3)

## 2022-08-29 NOTE — Telephone Encounter (Signed)
-----   Message from Glori Luis, MD sent at 08/29/2022  3:30 PM EDT ----- Please let the patient know that one of her liver enzymes is still mildly elevated and has gone up slightly.  Has she been having any abdominal pain?  Has she been drinking any alcohol or taking Tylenol?

## 2022-08-29 NOTE — Telephone Encounter (Signed)
Left message to call back regarding lab results.

## 2022-08-30 NOTE — Telephone Encounter (Signed)
Noted. I would like to get an [place] of her liver to look for a structural cause of her elevated liver enzymes. I can order this once you speak with her. Thanks.

## 2022-08-30 NOTE — Telephone Encounter (Signed)
Pt called back and I read the message to her and she stated she has not had any abdominal pain or drinking any alcohol or taking tylenol

## 2022-08-31 ENCOUNTER — Other Ambulatory Visit: Payer: Self-pay | Admitting: Family Medicine

## 2022-08-31 ENCOUNTER — Telehealth: Payer: Self-pay | Admitting: Family Medicine

## 2022-08-31 DIAGNOSIS — R7989 Other specified abnormal findings of blood chemistry: Secondary | ICD-10-CM

## 2022-08-31 NOTE — Telephone Encounter (Signed)
Lft pt vm to call ofc to sch [facility]. thanks ?

## 2022-09-05 ENCOUNTER — Ambulatory Visit (INDEPENDENT_AMBULATORY_CARE_PROVIDER_SITE_OTHER): Payer: Medicare Other | Admitting: Family Medicine

## 2022-09-05 ENCOUNTER — Encounter: Payer: Self-pay | Admitting: Family Medicine

## 2022-09-05 VITALS — BP 126/76 | HR 64 | Temp 97.7°F | Ht 63.0 in | Wt 127.0 lb

## 2022-09-05 DIAGNOSIS — E1122 Type 2 diabetes mellitus with diabetic chronic kidney disease: Secondary | ICD-10-CM

## 2022-09-05 DIAGNOSIS — Z794 Long term (current) use of insulin: Secondary | ICD-10-CM | POA: Diagnosis not present

## 2022-09-05 DIAGNOSIS — I10 Essential (primary) hypertension: Secondary | ICD-10-CM

## 2022-09-05 DIAGNOSIS — N184 Chronic kidney disease, stage 4 (severe): Secondary | ICD-10-CM

## 2022-09-05 DIAGNOSIS — F418 Other specified anxiety disorders: Secondary | ICD-10-CM | POA: Diagnosis not present

## 2022-09-05 NOTE — Assessment & Plan Note (Signed)
Chronic issue.  Improved on recheck.  Patient will continue amlodipine 10 mg daily and losartan 50 mg daily.

## 2022-09-05 NOTE — Assessment & Plan Note (Signed)
Chronic issue.  Adequately controlled.  She will continue Zoloft 50 mg daily.

## 2022-09-05 NOTE — Progress Notes (Signed)
Kelly Alar, MD Phone: [phone]  Kelly [name] is a 82 y.o. female who presents today for f/u.  DIABETES Disease Monitoring: Blood Sugar ranges-160s when daughter was managing her diet, up to mid 200s while daughter was out of town for a month Polyuria/phagia/dipsia- notes polyuria      Medications: Compliance- taking basaglar 6 u daily Hypoglycemic symptoms- no A1c was down to 7 in February.   HYPERTENSION Disease Monitoring Home BP Monitoring mid 130s systolic she thinks Chest pain- no    Dyspnea- no Medications Compliance-  taking amlodipine, losartan.   Edema- notes occasionally BMET    Component Value Date/Time   NA 142 04/26/2022 1823   NA 144 07/05/2018 0000   NA 137 01/18/2013 1036   K 3.7 04/26/2022 1823   K 3.9 01/18/2013 1036   CL 105 04/26/2022 1823   CL 103 01/18/2013 1036   CO2 29 04/26/2022 1823   CO2 26 01/18/2013 1036   GLUCOSE 89 04/26/2022 1823   GLUCOSE 342 (H) 01/18/2013 1036   BUN 26 (H) 04/26/2022 1823   BUN 35 (A) 07/05/2018 0000   BUN 14 01/18/2013 1036   CREATININE 1.77 (H) 04/26/2022 1823   CREATININE 1.48 (H) 01/18/2013 1036   CALCIUM 9.3 04/26/2022 1823   CALCIUM 9.3 01/18/2013 1036   GFRNONAA 29 (L) 04/26/2022 1823   GFRNONAA 35 (L) 01/18/2013 1036   GFRAA 19 (L) 10/20/2019 1735   GFRAA 41 (L) 01/18/2013 1036   Anxiety/depression: Patient notes not having much of an issue with either of these things.  She continues on Zoloft.  She feels as though this is adequately controlled.   Social History   Tobacco Use  Smoking Status Never  Smokeless Tobacco Never    Current Outpatient Medications on File Prior to Visit  Medication Sig Dispense Refill   amLODipine (NORVASC) 10 MG tablet Take 1 tablet (10 mg total) by mouth daily. 90 tablet 3   aspirin EC 81 MG tablet Take 81 mg by mouth daily.     calcitRIOL (ROCALTROL) 0.25 MCG capsule Take 0.25 mcg by mouth daily.     Calcium Citrate-Vitamin D 315-5 MG-MCG TABS Take 1  tablet by mouth daily.     Cholecalciferol (VITAMIN D) 50 MCG (2000 UT) CAPS Take 2,000 Units by mouth daily.      Continuous Blood Gluc Sensor (FREESTYLE LIBRE 2 SENSOR) MISC USE TO CHECK SUGAR AT LEAST4 TIMES A DAY 6 each 3   cyclobenzaprine (FLEXERIL) 10 MG tablet TAKE 1/2 TABLET (5MG  TOTAL)3 TIMES A DAY AS NEEDED    FOR MUSCLE SPASMS 45 tablet 0   diclofenac (FLECTOR) 1.3 % PTCH 1 patch 2 (two) times daily.     furosemide (LASIX) 40 MG tablet Take 40 mg by mouth daily as needed.     glucosamine-chondroitin 500-400 MG tablet Take 1 tablet by mouth 3 (three) times daily.     glucose blood (ACCU-CHEK AVIVA PLUS) test strip Use to check blood sugar up to 4 times daily 100 each 3   HYDROcodone-acetaminophen (NORCO/VICODIN) 5-325 MG tablet Take 1 tablet by mouth every 4 (four) hours as needed.     Insulin Glargine (BASAGLAR KWIKPEN) 100 UNIT/ML Inject 6 Units into the skin at bedtime. 15 mL 0   Insulin Pen Needle (BD PEN NEEDLE NANO 2ND GEN) 32G X 4 MM MISC USE DAILY WITH VICTOZA AND BASAGLAR 180 each 2   Lancets MISC Use up to 4 times daily to check blood sugars. 200 each 11  losartan (COZAAR) 50 MG tablet TAKE 1 TABLET DAILY 90 tablet 1   Melatonin 5 MG CHEW Chew 5 mg by mouth at bedtime as needed.     potassium chloride (K-DUR) 10 MEQ tablet Take 10 mEq by mouth daily as needed.     rosuvastatin (CRESTOR) 10 MG tablet Take 1 tablet (10 mg total) by mouth daily. 90 tablet 3   sertraline (ZOLOFT) 50 MG tablet Take 1 tablet (50 mg total) by mouth daily. 7 tablet 0   sertraline (ZOLOFT) 50 MG tablet Take 1 tablet (50 mg total) by mouth daily. 30 tablet 0   NARCAN 4 MG/0.1ML LIQD nasal spray kit 1 spray alternate nostril every 2 minutes until EMS arrives or patient is fully responsive (Patient not taking: Reported on 09/05/2022)     ondansetron (ZOFRAN-ODT) 4 MG disintegrating tablet Take by mouth. (Patient not taking: Reported on 09/05/2022)     Current Facility-Administered Medications on File  Prior to Visit  Medication Dose Route Frequency Provider Last Rate Last Admin   denosumab (PROLIA) injection 60 mg  60 mg Subcutaneous Q6 months Glori Luis, MD   60 mg at 04/05/21 1307     ROS see history of present illness  Objective  Physical Exam Vitals:   09/05/22 1202 09/05/22 1224  BP: (!) 144/70 126/76  Pulse: 64   Temp: 97.7 F (36.5 C)   SpO2: 99%     BP Readings from Last 3 Encounters:  09/05/22 126/76  07/01/22 (!) 155/72  06/28/22 138/80   Wt Readings from Last 3 Encounters:  09/05/22 127 lb (57.6 kg)  06/28/22 123 lb 12.8 oz (56.2 kg)  06/01/22 123 lb 12.8 oz (56.2 kg)    Physical Exam Constitutional:      General: She is not in acute distress.    Appearance: She is not diaphoretic.  Cardiovascular:     Rate and Rhythm: Normal rate and regular rhythm.     Heart sounds: Normal heart sounds.  Pulmonary:     Effort: Pulmonary effort is normal.     Breath sounds: Normal breath sounds.  Skin:    General: Skin is warm and dry.  Neurological:     Mental Status: She is alert.      Assessment/Plan: Please see individual problem list.  Primary hypertension Assessment & Plan: Chronic issue.  Improved on recheck.  Patient will continue amlodipine 10 mg daily and losartan 50 mg daily.   Type 2 diabetes mellitus with stage 4 chronic kidney disease, with long-term current use of insulin (HCC) Assessment & Plan: Chronic issue.  Most recent A1c was improved though diet has gotten worse again over the last month.  She will continue Basaglar 6 units daily.  She will let us know if her sugar trends up significantly.  Discussed emergency evaluation if excessively elevated sugars.  Advised if she felt bad while her sugars were elevated she should not drive.  She will continue to see endocrinology.  Encouraged healthier diet as she had when her daughter was helping manage her food intake.  Discussed the risks of uncontrolled diabetes including worsening  gastroparesis, worsening kidney function, stroke, and heart attack.   Mixed anxiety and depressive disorder Assessment & Plan: Chronic issue.  Adequately controlled.  She will continue Zoloft 50 mg daily.      Return in about 4 months (around 01/05/2023).   Kelly Alar, MD [facility]

## 2022-09-05 NOTE — Assessment & Plan Note (Signed)
Chronic issue.  Most recent A1c was improved though diet has gotten worse again over the last month.  She will continue Basaglar 6 units daily.  She will let us know if her sugar trends up significantly.  Discussed emergency evaluation if excessively elevated sugars.  Advised if she felt bad while her sugars were elevated she should not drive.  She will continue to see endocrinology.  Encouraged healthier diet as she had when her daughter was helping manage her food intake.  Discussed the risks of uncontrolled diabetes including worsening gastroparesis, worsening kidney function, stroke, and heart attack.

## 2022-09-05 NOTE — Patient Instructions (Signed)
Nice to see you. Please call with or send me a list of your BPs from home.  Please eat healthier as you were previously doing.

## 2022-09-07 ENCOUNTER — Ambulatory Visit
Admission: RE | Admit: 2022-09-07 | Discharge: 2022-09-07 | Disposition: A | Discharge status: Home / Self Care | Payer: Medicare Other | Source: Ambulatory Visit | Attending: Family Medicine | Admitting: Family Medicine

## 2022-09-07 DIAGNOSIS — R945 Abnormal results of liver function studies: Secondary | ICD-10-CM | POA: Diagnosis not present

## 2022-09-07 DIAGNOSIS — R7989 Other specified abnormal findings of blood chemistry: Secondary | ICD-10-CM | POA: Diagnosis not present

## 2022-09-07 DIAGNOSIS — Z9049 Acquired absence of other specified parts of digestive tract: Secondary | ICD-10-CM | POA: Diagnosis not present

## 2022-09-14 DIAGNOSIS — R112 Nausea with vomiting, unspecified: Secondary | ICD-10-CM | POA: Diagnosis not present

## 2022-09-14 DIAGNOSIS — Z86010 Personal history of colonic polyps: Secondary | ICD-10-CM | POA: Diagnosis not present

## 2022-09-15 ENCOUNTER — Telehealth: Payer: Self-pay | Admitting: *Deleted

## 2022-09-15 NOTE — Telephone Encounter (Signed)
Please place future orders for lab appt.  

## 2022-09-16 ENCOUNTER — Other Ambulatory Visit: Payer: Self-pay

## 2022-09-16 DIAGNOSIS — R7989 Other specified abnormal findings of blood chemistry: Secondary | ICD-10-CM

## 2022-09-16 NOTE — Telephone Encounter (Signed)
Order placed for hepatic function panel

## 2022-09-21 NOTE — Addendum Note (Signed)
Addended by: Glori Luis on: 09/21/2022 09:51 AM   Modules accepted: Orders

## 2022-09-23 ENCOUNTER — Other Ambulatory Visit (INDEPENDENT_AMBULATORY_CARE_PROVIDER_SITE_OTHER): Payer: Medicare Other

## 2022-09-23 ENCOUNTER — Other Ambulatory Visit: Payer: Medicare Other

## 2022-09-23 DIAGNOSIS — R7989 Other specified abnormal findings of blood chemistry: Secondary | ICD-10-CM | POA: Diagnosis not present

## 2022-09-23 NOTE — Addendum Note (Signed)
Addended by: Warden Fillers on: 09/23/2022 02:14 PM   Modules accepted: Orders

## 2022-09-24 LAB — HEPATIC FUNCTION PANEL
AG Ratio: 1.9 (calc) (ref 1.0–2.5)
ALT: 23 U/L (ref 6–29)
AST: 24 U/L (ref 10–35)
Albumin: 4 g/dL (ref 3.6–5.1)
Alkaline phosphatase (APISO): 68 U/L (ref 37–153)
Bilirubin, Direct: 0.1 mg/dL (ref 0.0–0.2)
Globulin: 2.1 g/dL (calc) (ref 1.9–3.7)
Indirect Bilirubin: 0.7 mg/dL (calc) (ref 0.2–1.2)
Total Bilirubin: 0.8 mg/dL (ref 0.2–1.2)
Total Protein: 6.1 g/dL (ref 6.1–8.1)

## 2022-09-26 ENCOUNTER — Ambulatory Visit: Payer: Medicare Other | Admitting: Nurse Practitioner

## 2022-10-06 ENCOUNTER — Encounter: Payer: Self-pay | Admitting: Nurse Practitioner

## 2022-10-06 ENCOUNTER — Ambulatory Visit (INDEPENDENT_AMBULATORY_CARE_PROVIDER_SITE_OTHER): Payer: Medicare Other | Admitting: Nurse Practitioner

## 2022-10-06 VITALS — BP 146/78 | HR 63 | Ht 63.0 in | Wt 128.8 lb

## 2022-10-06 DIAGNOSIS — E1122 Type 2 diabetes mellitus with diabetic chronic kidney disease: Secondary | ICD-10-CM

## 2022-10-06 DIAGNOSIS — N184 Chronic kidney disease, stage 4 (severe): Secondary | ICD-10-CM

## 2022-10-06 DIAGNOSIS — N1832 Chronic kidney disease, stage 3b: Secondary | ICD-10-CM | POA: Diagnosis not present

## 2022-10-06 DIAGNOSIS — Z794 Long term (current) use of insulin: Secondary | ICD-10-CM | POA: Diagnosis not present

## 2022-10-06 LAB — POCT GLYCOSYLATED HEMOGLOBIN (HGB A1C): Hemoglobin A1C: 8.9 % — AB (ref 4.0–5.6)

## 2022-10-06 MED ORDER — BASAGLAR KWIKPEN 100 UNIT/ML ~~LOC~~ SOPN: 6.0000 [IU] | PEN_INJECTOR | Freq: Every day | SUBCUTANEOUS | 3 refills | Status: DC

## 2022-10-06 NOTE — Progress Notes (Signed)
Endocrinology Follow Up Note       10/06/2022, 2:55 PM   Subjective:    Patient ID: Kelly [name], female    DOB: 02-19-1941.  Kelly [name] is being seen in follow up after being seen in consultation for management of currently uncontrolled symptomatic diabetes requested by  Glori Luis, MD.   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     Past Surgical History:  Procedure Laterality Date   APPENDECTOMY     BREAST BIOPSY Right 06/19/2018   affirm stereo/x clip/COLUMNAR CELL CHANGE WITH MICROCALCIFICATIONS. FIBROCYSTIC CHANGES WITH MICROCALCIFICATIONS   cataract  03/2017   CHOLECYSTECTOMY     COLONOSCOPY     COLONOSCOPY WITH PROPOFOL N/A 08/22/2017   Procedure: COLONOSCOPY WITH PROPOFOL;  Surgeon: Wyline Mood, MD;  Location: [facility] ENDOSCOPY;  Service: Gastroenterology;  Laterality: N/A;   ESOPHAGOGASTRODUODENOSCOPY     ESOPHAGOGASTRODUODENOSCOPY (EGD) WITH PROPOFOL N/A 03/23/2016   Procedure: ESOPHAGOGASTRODUODENOSCOPY (EGD) WITH PROPOFOL;  Surgeon: Scot Jun, MD;  Location: [facility] ENDOSCOPY;  Service: Endoscopy;  Laterality: N/A;   FLEXIBLE SIGMOIDOSCOPY N/A 02/06/2018   Procedure: FLEXIBLE SIGMOIDOSCOPY;  Surgeon: Wyline Mood, MD;  Location: [facility] ENDOSCOPY;  Service: Gastroenterology;  Laterality: N/A;   FLEXIBLE SIGMOIDOSCOPY N/A 12/03/2018   Procedure: FLEXIBLE SIGMOIDOSCOPY;  Surgeon: Wyline Mood, MD;  Location: [facility] ENDOSCOPY;  Service: Gastroenterology;  Laterality: N/A;   RECTAL PROLAPSE REPAIR  2012   x2    Social History   Socioeconomic History   Marital status: Widowed    Spouse name: Not on file   Number  of children: Not on file   Years of education: Not on file   Highest education level: High school graduate  Occupational History   Occupation: retired   Tobacco Use   Smoking status: Never   Smokeless tobacco: Never  Vaping Use   Vaping Use: Never used  Substance and Sexual Activity   Alcohol use: No   Drug use: No   Sexual activity: Not Currently    Partners: Male  Other Topics Concern   Not on file  Social History Narrative   Not on file   Social Determinants of Health   Financial Resource Strain: Low Risk  (06/10/2021)   Overall Financial Resource Strain (CARDIA)    Difficulty of Paying Living Expenses: Not hard at all  Food Insecurity: No Food Insecurity (05/30/2022)   Hunger Vital Sign    Worried About Running Out of Food in the Last Year: Never true    Ran Out of Food in the Last Year: Never true  Transportation Needs: No Transportation Needs (05/30/2022)   PRAPARE - Administrator, Civil Service (Medical): No    Lack of Transportation (Non-Medical): No  Physical Activity: Insufficiently Active (11/03/2020)   Exercise Vital Sign    Days of Exercise per Week: 3  days    Minutes of Exercise per Session: 20 min  Stress: No Stress Concern Present (11/03/2020)   [employer] of Occupational Health - Occupational Stress Questionnaire    Feeling of Stress : Only a little  Social Connections: Unknown (11/03/2020)   Social Connection and Isolation Panel [NHANES]    Frequency of Communication with Friends and Family: More than three times a week    Frequency of Social Gatherings with Friends and Family: Not on file    Attends Religious Services: Not on file    Active Member of Clubs or Organizations: Not on file    Attends Banker Meetings: Not on file    Marital Status: Not on file    Family History  Problem Relation Age of Onset   Stroke Mother    Arthritis Mother    Aortic aneurysm Sister    Lung cancer Sister    Heart attack Father 45    Breast cancer Sister 87    Outpatient Encounter Medications as of 10/06/2022  Medication Sig   amLODipine (NORVASC) 10 MG tablet Take 1 tablet (10 mg total) by mouth daily.   aspirin EC 81 MG tablet Take 81 mg by mouth daily.   calcitRIOL (ROCALTROL) 0.25 MCG capsule Take 0.25 mcg by mouth daily.   Calcium Citrate-Vitamin D 315-5 MG-MCG TABS Take 1 tablet by mouth daily.   Cholecalciferol (VITAMIN D) 50 MCG (2000 UT) CAPS Take 2,000 Units by mouth daily.    Continuous Blood Gluc Sensor (FREESTYLE LIBRE 2 SENSOR) MISC USE TO CHECK SUGAR AT LEAST4 TIMES A DAY   cyclobenzaprine (FLEXERIL) 10 MG tablet TAKE 1/2 TABLET (5MG  TOTAL)3 TIMES A DAY AS NEEDED    FOR MUSCLE SPASMS   diclofenac (FLECTOR) 1.3 % PTCH 1 patch 2 (two) times daily.   furosemide (LASIX) 40 MG tablet Take 40 mg by mouth daily as needed.   glucosamine-chondroitin 500-400 MG tablet Take 1 tablet by mouth 3 (three) times daily.   glucose blood (ACCU-CHEK AVIVA PLUS) test strip Use to check blood sugar up to 4 times daily   HYDROcodone-acetaminophen (NORCO/VICODIN) 5-325 MG tablet Take 1 tablet by mouth every 4 (four) hours as needed.   Insulin Pen Needle (BD PEN NEEDLE NANO 2ND GEN) 32G X 4 MM MISC USE DAILY WITH VICTOZA AND BASAGLAR   Lancets MISC Use up to 4 times daily to check blood sugars.   losartan (COZAAR) 50 MG tablet TAKE 1 TABLET DAILY   Melatonin 5 MG CHEW Chew 5 mg by mouth at bedtime as needed.   potassium chloride (K-DUR) 10 MEQ tablet Take 10 mEq by mouth daily as needed.   rosuvastatin (CRESTOR) 10 MG tablet Take 1 tablet (10 mg total) by mouth daily.   sertraline (ZOLOFT) 50 MG tablet Take 1 tablet (50 mg total) by mouth daily.   sertraline (ZOLOFT) 50 MG tablet Take 1 tablet (50 mg total) by mouth daily.   [DISCONTINUED] Insulin Glargine (BASAGLAR KWIKPEN) 100 UNIT/ML Inject 6 Units into the skin at bedtime.   Insulin Glargine (BASAGLAR KWIKPEN) 100 UNIT/ML Inject 6 Units into the skin at bedtime.   NARCAN 4  MG/0.1ML LIQD nasal spray kit 1 spray alternate nostril every 2 minutes until EMS arrives or patient is fully responsive (Patient not taking: Reported on 09/05/2022)   ondansetron (ZOFRAN-ODT) 4 MG disintegrating tablet Take by mouth. (Patient not taking: Reported on 09/05/2022)   Facility-Administered Encounter Medications as of 10/06/2022  Medication   denosumab (PROLIA) injection 60 mg  ALLERGIES: Allergies  Allergen Reactions   Prilosec Otc [Omeprazole Magnesium] Other (See Comments)    Maybe cause of acute interstitial nephritis   Sucralfate Other (See Comments)    Maybe cause of acute interstitial nephritis   Amoxicillin Diarrhea   Morphine And Codeine Other (See Comments)    Chest pains Chest pains    VACCINATION STATUS: Immunization History  Administered Date(s) Administered   Fluad Quad(high Dose 65+) 02/25/2019, 03/02/2020, 05/17/2021, 04/08/2022   Influenza, High Dose Seasonal PF 02/24/2017, 02/28/2018   Influenza,inj,quad, With Preservative 02/19/2016   Moderna Sars-Covid-2 Vaccination 07/05/2019, 08/12/2019, 03/02/2020, 08/10/2020   PNEUMOCOCCAL CONJUGATE-20 07/02/2021   Td 02/26/2019   Zoster Recombinat (Shingrix) 01/02/2020, 03/10/2020    Diabetes She presents for her follow-up diabetic visit. She has type 2 diabetes mellitus. Onset time: diagnosed at approx age of 47. Her disease course has been worsening. There are no hypoglycemic associated symptoms. There are no hypoglycemic complications. Diabetic complications include nephropathy. (gastroparesis) Risk factors for coronary artery disease include diabetes mellitus and post-menopausal. Current diabetic treatment includes insulin injections and diet. She is compliant with treatment most of the time. Her weight is fluctuating minimally. She is following a generally unhealthy diet. When asked about meal planning, she reported none. She has not had a previous visit with a dietitian. She rarely participates in exercise.  Her home blood glucose trend is increasing steadily. Her overall blood glucose range is >200 mg/dl. (She presents today with her CGM showing above target glycemic profile overall.  Her POCT A1c today is 8.9%, increasing from last visit of 7%.  She admits she has been eating things she knows she shouldn't.  Her daughter usually keeps her in line with diet but has been out of state for over a month now.  Analysis of her CGM shows TIR 24%, TAR 76%, TBR 0% with a GMI of 9.2%.  She denies any hypoglycemia.) An ACE inhibitor/angiotensin II receptor blocker is being taken. She does not see a podiatrist.Eye exam is current.    Review of systems  Constitutional: + Minimally fluctuating body weight,  current Body mass index is 22.82 kg/m. , no fatigue, no subjective hyperthermia, no subjective hypothermia Eyes: no blurry vision, no xerophthalmia ENT: no sore throat, no nodules palpated in throat, no dysphagia/odynophagia, no hoarseness Cardiovascular: no chest pain, no shortness of breath, no palpitations, no leg swelling Respiratory: no cough, no shortness of breath Gastrointestinal: no nausea/vomiting/diarrhea Musculoskeletal: no muscle/joint aches Skin: no rashes, no hyperemia Neurological: no tremors, no numbness, no tingling, no dizziness Psychiatric: no depression, no anxiety  Objective:     BP (!) 146/78 (BP Location: Right Arm, Patient Position: Sitting, Cuff Size: Large) Comment: retake manuel cuff  Pulse 63   Ht 5\' 3"  (1.6 m)   Wt 128 lb 12.8 oz (58.4 kg)   BMI 22.82 kg/m   Wt Readings from Last 3 Encounters:  10/06/22 128 lb 12.8 oz (58.4 kg)  09/05/22 127 lb (57.6 kg)  06/28/22 123 lb 12.8 oz (56.2 kg)     BP Readings from Last 3 Encounters:  10/06/22 (!) 146/78  09/05/22 126/76  07/01/22 (!) 155/72      Physical Exam- Limited  Constitutional:  Body mass index is 22.82 kg/m. , not in acute distress, normal state of mind Eyes:  EOMI, no exophthalmos Neck:  Supple Musculoskeletal: no gross deformities, strength intact in all four extremities, no gross restriction of joint movements Skin:  no rashes, no hyperemia Neurological: no tremor with outstretched hands  Diabetic Foot Exam - Simple   Simple Foot Form Diabetic Foot exam was performed with the following findings: Yes 10/06/2022  2:50 PM  Visual Inspection No deformities, no ulcerations, no other skin breakdown bilaterally: Yes Sensation Testing Intact to touch and monofilament testing bilaterally: Yes Pulse Check Posterior Tibialis and Dorsalis pulse intact bilaterally: Yes Comments     CMP ( most recent) CMP     Component Value Date/Time   NA 142 04/26/2022 1823   NA 144 07/05/2018 0000   NA 137 01/18/2013 1036   K 3.7 04/26/2022 1823   K 3.9 01/18/2013 1036   CL 105 04/26/2022 1823   CL 103 01/18/2013 1036   CO2 29 04/26/2022 1823   CO2 26 01/18/2013 1036   GLUCOSE 89 04/26/2022 1823   GLUCOSE 342 (H) 01/18/2013 1036   BUN 26 (H) 04/26/2022 1823   BUN 35 (A) 07/05/2018 0000   BUN 14 01/18/2013 1036   CREATININE 1.77 (H) 04/26/2022 1823   CREATININE 1.48 (H) 01/18/2013 1036   CALCIUM 9.3 04/26/2022 1823   CALCIUM 9.3 01/18/2013 1036   PROT 6.1 09/23/2022 1545   PROT 6.2 (L) 01/18/2013 1036   ALBUMIN 4.1 08/29/2022 0953   ALBUMIN 3.3 (L) 01/18/2013 1036   AST 24 09/23/2022 1545   AST 22 01/18/2013 1036   ALT 23 09/23/2022 1545   ALT 46 01/18/2013 1036   ALKPHOS 84 08/29/2022 0953   ALKPHOS 120 01/18/2013 1036   BILITOT 0.8 09/23/2022 1545   BILITOT 0.6 01/18/2013 1036   GFRNONAA 29 (L) 04/26/2022 1823   GFRNONAA 35 (L) 01/18/2013 1036   GFRAA 19 (L) 10/20/2019 1735   GFRAA 41 (L) 01/18/2013 1036     Diabetic Labs (most recent): Lab Results  Component Value Date   HGBA1C 8.9 (A) 10/06/2022   HGBA1C 7.0 (H) 07/07/2022   HGBA1C 8.9 (A) 04/04/2022   HGBA1C 8.9 04/04/2022   HGBA1C 8.9 (A) 04/04/2022   HGBA1C 8.9 (A) 04/04/2022   MICROALBUR 4.5 (H)  08/02/2017     Lipid Panel ( most recent) Lipid Panel     Component Value Date/Time   CHOL 138 07/07/2022 0751   TRIG 109.0 07/07/2022 0751   HDL 42.20 07/07/2022 0751   CHOLHDL 3 07/07/2022 0751   VLDL 21.8 07/07/2022 0751   LDLCALC 74 07/07/2022 0751   LDLDIRECT 78.0 02/18/2019 0857      Lab Results  Component Value Date   TSH 4.41 11/08/2021   TSH 3.93 03/22/2017   TSH 0.719 07/09/2016           Assessment & Plan:   1) Type 2 diabetes mellitus with stage 3b chronic kidney disease, with long-term current use of insulin (HCC)  She presents today with her CGM showing above target glycemic profile overall.  Her POCT A1c today is 8.9%, increasing from last visit of 7%.  She admits she has been eating things she knows she shouldn't.  Her daughter usually keeps her in line with diet but has been out of state for over a month now.  Analysis of her CGM shows TIR 24%, TAR 76%, TBR 0% with a GMI of 9.2%.  She denies any hypoglycemia.  - Nakyra JODE NIDO has currently uncontrolled symptomatic type 2 DM since 82 years of age.   -Recent labs reviewed.  - I had a long discussion with her about the progressive nature of diabetes and the pathology behind its complications. -her diabetes is complicated by CKD stage 4, and gastroparesis and she  remains at a high risk for more acute and chronic complications which include CAD, CVA, CKD, retinopathy, and neuropathy. These are all discussed in detail with her.  The following Lifestyle Medicine recommendations according to American College of Lifestyle Medicine [facility]) were discussed and offered to patient and she agrees to start the journey:  A. Whole Foods, Plant-based plate comprising of fruits and vegetables, plant-based proteins, whole-grain carbohydrates was discussed in detail with the patient.   A list for source of those nutrients were also provided to the patient.  Patient will use only water or unsweetened tea for hydration. B.   The need to stay away from risky substances including alcohol, smoking; obtaining 7 to 9 hours of restorative sleep, at least 150 minutes of moderate intensity exercise weekly, the importance of healthy social connections,  and stress reduction techniques were discussed. C.  A full color page of  Calorie density of various food groups per pound showing examples of each food groups was provided to the patient.  - Nutritional counseling repeated at each appointment due to patients tendency to fall back in to old habits.  - The patient admits there is a room for improvement in their diet and drink choices. -  Suggestion is made for the patient to avoid simple carbohydrates from their diet including Cakes, Sweet Desserts / Pastries, Ice Cream, Soda (diet and regular), Sweet Tea, Candies, Chips, Cookies, Sweet Pastries, Store Bought Juices, Alcohol in Excess of 1-2 drinks a day, Artificial Sweeteners, Coffee Creamer, and "Sugar-free" Products. This will help patient to have stable blood glucose profile and potentially avoid unintended weight gain.   - I encouraged the patient to switch to unprocessed or minimally processed complex starch and increased protein intake (animal or plant source), fruits, and vegetables.   - Patient is advised to stick to a routine mealtimes to eat 3 meals a day and avoid unnecessary snacks (to snack only to correct hypoglycemia).  - I have approached her with the following individualized plan to manage her diabetes and patient agrees:   -Avoiding hypoglycemia is a top priority in her care.  A good goal A1c for her would be 7%.  -She is advised to continue her Basaglar 6 units SQ daily at noon-when she wakes (says she tends to forget if she is to take it in the evening).  We discussed the importance of restarting her dietary regimen, avoiding concentrated sweets instead of increasing her insulin doses.  -she is encouraged to continue monitoring glucose at least 2 times daily  (using her CGM), before breakfast and before bed, and to call the clinic if she has readings less than 70 or above 300 for 3 tests in a row.  - she is warned not to take insulin without proper monitoring per orders. - Adjustment parameters are given to her for hypo and hyperglycemia in writing.  -She is not an ideal candidate for incretin therapy due to body habitus with BMI of 22.  - Specific targets for  A1c; LDL, HDL, and Triglycerides were discussed with the patient.  2) Blood Pressure /Hypertension:  her blood pressure is controlled to target for her age.   she is advised to continue her current medications including Losartan 50 mg p.o. daily with breakfast, and Norvasc 10 mg po daily.  She also takes Lasix 40 po daily as needed for fluid.  She does see nephrology.  3) Lipids/Hyperlipidemia:    Review of her recent lipid panel from 07/07/22 showed controlled LDL at 74 .  she is advised to continue Crestor 10 mg daily at bedtime.  Side effects and precautions discussed with her.  4)  Weight/Diet:  her Body mass index is 22.82 kg/m.  -    she is NOT a candidate for weight loss.   Exercise, and detailed carbohydrates information provided  -  detailed on discharge instructions.  5) Chronic Care/Health Maintenance: -she is on ACEI/ARB and Statin medications and is encouraged to initiate and continue to follow up with Ophthalmology, Dentist, Podiatrist at least yearly or according to recommendations, and advised to stay away from smoking. I have recommended yearly flu vaccine and pneumonia vaccine at least every 5 years; moderate intensity exercise for up to 150 minutes weekly; and sleep for at least 7 hours a day.  - she is advised to maintain close follow up with Birdie Sons Yehuda Mao, MD for primary care needs, as well as her other providers for optimal and coordinated care.     I spent  33  minutes in the care of the patient today including review of labs from CMP, Lipids, Thyroid Function,  Hematology (current and previous including abstractions from other facilities); face-to-face time discussing  her blood glucose readings/logs, discussing hypoglycemia and hyperglycemia episodes and symptoms, medications doses, her options of short and long term treatment based on the latest standards of care / guidelines;  discussion about incorporating lifestyle medicine;  and documenting the encounter. Risk reduction counseling performed per USPSTF guidelines to reduce obesity and cardiovascular risk factors.     Please refer to Patient Instructions for Blood Glucose Monitoring and Insulin/Medications Dosing Guide"  in media tab for additional information. Please  also refer to " Patient Self Inventory" in the Media  tab for reviewed elements of pertinent patient history.  Brendalyn Gershon Cull participated in the discussions, expressed understanding, and voiced agreement with the above plans.  All questions were answered to her satisfaction. she is encouraged to contact clinic should she have any questions or concerns prior to her return visit.     Follow up plan: - Return in about 3 months (around 01/06/2023) for Diabetes F/U with A1c in office, No previsit labs, Bring meter and logs.   Ronny Bacon, [facility] [address] [place], [place] [address] Phone: [phone] Fax: [phone]  10/06/2022, 2:55 PM

## 2022-10-06 NOTE — Patient Instructions (Signed)

## 2022-10-07 ENCOUNTER — Telehealth: Payer: Self-pay | Admitting: *Deleted

## 2022-10-07 DIAGNOSIS — M546 Pain in thoracic spine: Secondary | ICD-10-CM | POA: Diagnosis not present

## 2022-10-07 DIAGNOSIS — T2101XA Burn of unspecified degree of chest wall, initial encounter: Secondary | ICD-10-CM | POA: Diagnosis not present

## 2022-10-07 DIAGNOSIS — M5136 Other intervertebral disc degeneration, lumbar region: Secondary | ICD-10-CM | POA: Diagnosis not present

## 2022-10-07 DIAGNOSIS — Z79899 Other long term (current) drug therapy: Secondary | ICD-10-CM | POA: Diagnosis not present

## 2022-10-07 DIAGNOSIS — Q762 Congenital spondylolisthesis: Secondary | ICD-10-CM | POA: Diagnosis not present

## 2022-10-07 DIAGNOSIS — M5032 Other cervical disc degeneration, mid-cervical region, unspecified level: Secondary | ICD-10-CM | POA: Diagnosis not present

## 2022-10-07 DIAGNOSIS — M533 Sacrococcygeal disorders, not elsewhere classified: Secondary | ICD-10-CM | POA: Diagnosis not present

## 2022-10-07 DIAGNOSIS — M461 Sacroiliitis, not elsewhere classified: Secondary | ICD-10-CM | POA: Diagnosis not present

## 2022-10-07 DIAGNOSIS — M25572 Pain in left ankle and joints of left foot: Secondary | ICD-10-CM | POA: Diagnosis not present

## 2022-10-07 DIAGNOSIS — E1165 Type 2 diabetes mellitus with hyperglycemia: Secondary | ICD-10-CM | POA: Diagnosis not present

## 2022-10-07 DIAGNOSIS — M7062 Trochanteric bursitis, left hip: Secondary | ICD-10-CM | POA: Diagnosis not present

## 2022-10-07 DIAGNOSIS — M6281 Muscle weakness (generalized): Secondary | ICD-10-CM | POA: Diagnosis not present

## 2022-10-07 NOTE — Telephone Encounter (Signed)
$  0 due; PA not required   

## 2022-10-10 ENCOUNTER — Ambulatory Visit (INDEPENDENT_AMBULATORY_CARE_PROVIDER_SITE_OTHER): Payer: Medicare Other | Admitting: *Deleted

## 2022-10-10 DIAGNOSIS — M810 Age-related osteoporosis without current pathological fracture: Secondary | ICD-10-CM

## 2022-10-10 MED ORDER — DENOSUMAB 60 MG/ML ~~LOC~~ SOSY
60.0000 mg | PREFILLED_SYRINGE | Freq: Once | SUBCUTANEOUS | Status: AC
  Administered 2022-10-10: 60 mg via SUBCUTANEOUS

## 2022-10-10 NOTE — Progress Notes (Signed)
Pt received Prolia injection in left arm (subcu)-tolerated it well with no concerns or complaints.

## 2022-10-17 ENCOUNTER — Telehealth: Payer: Self-pay | Admitting: Family Medicine

## 2022-10-17 DIAGNOSIS — E1122 Type 2 diabetes mellitus with diabetic chronic kidney disease: Secondary | ICD-10-CM

## 2022-10-17 NOTE — Telephone Encounter (Signed)
Prescription Request  10/17/2022  LOV: 09/05/2022  What is the name of the medication or equipment? Insulin Pen Needle (BD PEN NEEDLE NANO 2ND GEN) 32G X 4 MM MISC and Libre 2 sensor.   Have you contacted your pharmacy to request a refill? Yes   Which pharmacy would you like this sent to?   [facility] - [place], [place] - [facility] AT Portal to Reg[facility] [address] [place] [address] Phone: [phone] Fax: [phone]   Patient notified that their request is being sent to the clinical staff for review and that they should receive a response within 2 business days.   Please advise at [facility] [phone]

## 2022-10-18 DIAGNOSIS — Z85828 Personal history of other malignant neoplasm of skin: Secondary | ICD-10-CM | POA: Diagnosis not present

## 2022-10-18 DIAGNOSIS — Z08 Encounter for follow-up examination after completed treatment for malignant neoplasm: Secondary | ICD-10-CM | POA: Diagnosis not present

## 2022-10-18 DIAGNOSIS — C44622 Squamous cell carcinoma of skin of right upper limb, including shoulder: Secondary | ICD-10-CM | POA: Diagnosis not present

## 2022-10-18 DIAGNOSIS — D485 Neoplasm of uncertain behavior of skin: Secondary | ICD-10-CM | POA: Diagnosis not present

## 2022-10-18 DIAGNOSIS — L570 Actinic keratosis: Secondary | ICD-10-CM | POA: Diagnosis not present

## 2022-10-19 MED ORDER — FREESTYLE LIBRE 2 SENSOR MISC: 3 refills | Status: DC

## 2022-10-19 MED ORDER — BD PEN NEEDLE NANO 2ND GEN 32G X 4 MM MISC: 2 refills | Status: DC

## 2022-10-19 NOTE — Telephone Encounter (Signed)
done

## 2022-10-24 ENCOUNTER — Telehealth: Payer: Self-pay | Admitting: Family Medicine

## 2022-10-24 DIAGNOSIS — N1832 Chronic kidney disease, stage 3b: Secondary | ICD-10-CM | POA: Diagnosis not present

## 2022-10-24 DIAGNOSIS — E1122 Type 2 diabetes mellitus with diabetic chronic kidney disease: Secondary | ICD-10-CM

## 2022-10-24 DIAGNOSIS — I10 Essential (primary) hypertension: Secondary | ICD-10-CM | POA: Diagnosis not present

## 2022-10-24 DIAGNOSIS — N2581 Secondary hyperparathyroidism of renal origin: Secondary | ICD-10-CM | POA: Diagnosis not present

## 2022-10-24 MED ORDER — FREESTYLE LIBRE 2 SENSOR MISC: 0 refills | Status: DC

## 2022-10-24 NOTE — Addendum Note (Signed)
Addended by: [place], Gaynor Ferreras on: 10/24/2022 02:10 PM   Modules accepted: Orders

## 2022-10-24 NOTE — Telephone Encounter (Signed)
Prescription Request  10/24/2022  LOV: 09/05/2022 THIS MESSAGE IS BEING SENT TO THE DOC OF THE DAY. Patient needs a sample of  Continuous Glucose Sensor (FREESTYLE LIBRE 2 SENSOR) MISC . She is out. OR can a prescription be sent for one to pharmacy?    What is the name of the medication or equipment? Continuous Glucose Sensor (FREESTYLE LIBRE 2 SENSOR) MISC    Have you contacted your pharmacy to request a refill? No   Which pharmacy would you like this sent to?    Patient notified that their request is being sent to the clinical staff for review and that they should receive a response within 2 business days.   Please advise at [facility] [phone]

## 2022-10-27 DIAGNOSIS — Z79899 Other long term (current) drug therapy: Secondary | ICD-10-CM | POA: Diagnosis not present

## 2022-10-27 DIAGNOSIS — Z885 Allergy status to narcotic agent status: Secondary | ICD-10-CM | POA: Diagnosis not present

## 2022-10-27 DIAGNOSIS — Z86010 Personal history of colonic polyps: Secondary | ICD-10-CM | POA: Diagnosis not present

## 2022-10-27 DIAGNOSIS — Z881 Allergy status to other antibiotic agents status: Secondary | ICD-10-CM | POA: Diagnosis not present

## 2022-10-27 DIAGNOSIS — N184 Chronic kidney disease, stage 4 (severe): Secondary | ICD-10-CM | POA: Diagnosis not present

## 2022-10-27 DIAGNOSIS — Z1211 Encounter for screening for malignant neoplasm of colon: Secondary | ICD-10-CM | POA: Diagnosis not present

## 2022-10-27 DIAGNOSIS — E1122 Type 2 diabetes mellitus with diabetic chronic kidney disease: Secondary | ICD-10-CM | POA: Diagnosis not present

## 2022-10-27 DIAGNOSIS — K6289 Other specified diseases of anus and rectum: Secondary | ICD-10-CM | POA: Diagnosis not present

## 2022-10-27 DIAGNOSIS — I129 Hypertensive chronic kidney disease with stage 1 through stage 4 chronic kidney disease, or unspecified chronic kidney disease: Secondary | ICD-10-CM | POA: Diagnosis not present

## 2022-10-27 DIAGNOSIS — Z7982 Long term (current) use of aspirin: Secondary | ICD-10-CM | POA: Diagnosis not present

## 2022-10-27 DIAGNOSIS — Z880 Allergy status to penicillin: Secondary | ICD-10-CM | POA: Diagnosis not present

## 2022-10-27 DIAGNOSIS — Z86018 Personal history of other benign neoplasm: Secondary | ICD-10-CM | POA: Diagnosis not present

## 2022-10-27 DIAGNOSIS — Z888 Allergy status to other drugs, medicaments and biological substances status: Secondary | ICD-10-CM | POA: Diagnosis not present

## 2022-10-27 DIAGNOSIS — K5730 Diverticulosis of large intestine without perforation or abscess without bleeding: Secondary | ICD-10-CM | POA: Diagnosis not present

## 2022-11-14 ENCOUNTER — Telehealth: Payer: Self-pay | Admitting: Family Medicine

## 2022-11-15 NOTE — Telephone Encounter (Signed)
Prescription Request  11/15/2022  LOV: 09/05/2022  What is the name of the medication or equipment? lorsartan  Have you contacted your pharmacy to request a refill? Yes   [facility]   Patient notified that their request is being sent to the clinical staff for review and that they should receive a response within 2 business days.   Please advise at Mobile [phone] (mobile)

## 2022-11-18 ENCOUNTER — Other Ambulatory Visit: Payer: Self-pay

## 2022-11-18 MED ORDER — LOSARTAN POTASSIUM 50 MG PO TABS: 50.0000 mg | ORAL_TABLET | Freq: Every day | ORAL | 1 refills | Status: DC

## 2022-11-18 NOTE — Telephone Encounter (Signed)
Prescription sent in  

## 2022-12-06 DIAGNOSIS — C44622 Squamous cell carcinoma of skin of right upper limb, including shoulder: Secondary | ICD-10-CM | POA: Diagnosis not present

## 2022-12-07 ENCOUNTER — Encounter (INDEPENDENT_AMBULATORY_CARE_PROVIDER_SITE_OTHER): Payer: Self-pay

## 2022-12-07 DIAGNOSIS — M7062 Trochanteric bursitis, left hip: Secondary | ICD-10-CM | POA: Diagnosis not present

## 2022-12-07 DIAGNOSIS — M5032 Other cervical disc degeneration, mid-cervical region, unspecified level: Secondary | ICD-10-CM | POA: Diagnosis not present

## 2022-12-07 DIAGNOSIS — Z79899 Other long term (current) drug therapy: Secondary | ICD-10-CM | POA: Diagnosis not present

## 2022-12-07 DIAGNOSIS — Q762 Congenital spondylolisthesis: Secondary | ICD-10-CM | POA: Diagnosis not present

## 2022-12-07 DIAGNOSIS — M6281 Muscle weakness (generalized): Secondary | ICD-10-CM | POA: Diagnosis not present

## 2022-12-07 DIAGNOSIS — E1165 Type 2 diabetes mellitus with hyperglycemia: Secondary | ICD-10-CM | POA: Diagnosis not present

## 2022-12-07 DIAGNOSIS — M47812 Spondylosis without myelopathy or radiculopathy, cervical region: Secondary | ICD-10-CM | POA: Diagnosis not present

## 2022-12-07 DIAGNOSIS — M5136 Other intervertebral disc degeneration, lumbar region: Secondary | ICD-10-CM | POA: Diagnosis not present

## 2022-12-07 DIAGNOSIS — M533 Sacrococcygeal disorders, not elsewhere classified: Secondary | ICD-10-CM | POA: Diagnosis not present

## 2022-12-07 DIAGNOSIS — M25572 Pain in left ankle and joints of left foot: Secondary | ICD-10-CM | POA: Diagnosis not present

## 2022-12-07 DIAGNOSIS — M461 Sacroiliitis, not elsewhere classified: Secondary | ICD-10-CM | POA: Diagnosis not present

## 2022-12-07 DIAGNOSIS — T2101XA Burn of unspecified degree of chest wall, initial encounter: Secondary | ICD-10-CM | POA: Diagnosis not present

## 2022-12-08 ENCOUNTER — Telehealth: Payer: Self-pay | Admitting: Family Medicine

## 2022-12-08 DIAGNOSIS — I10 Essential (primary) hypertension: Secondary | ICD-10-CM | POA: Diagnosis not present

## 2022-12-08 DIAGNOSIS — N1832 Chronic kidney disease, stage 3b: Secondary | ICD-10-CM | POA: Diagnosis not present

## 2022-12-08 DIAGNOSIS — R600 Localized edema: Secondary | ICD-10-CM | POA: Diagnosis not present

## 2022-12-08 DIAGNOSIS — E1122 Type 2 diabetes mellitus with diabetic chronic kidney disease: Secondary | ICD-10-CM | POA: Diagnosis not present

## 2022-12-08 DIAGNOSIS — N2581 Secondary hyperparathyroidism of renal origin: Secondary | ICD-10-CM | POA: Diagnosis not present

## 2022-12-08 NOTE — Telephone Encounter (Signed)
Patient went to put on a new Libre 2 and when she went to place it into her arm if feel apart. She wanted to know if the office had  a Libre 2 to replace the broken one. [facility]. She does have a new one in her arm and one more Libre in a box.

## 2022-12-09 ENCOUNTER — Telehealth: Payer: Self-pay | Admitting: Family Medicine

## 2022-12-09 NOTE — Telephone Encounter (Signed)
Copied from CRM [id number]. Topic: Medicare AWV >> Dec 09, 2022  1:51 PM Payton Doughty wrote: Reason for CRM: LM 12/09/2022 to schedule AWV   Verlee Rossetti; Care Guide Ambulatory Clinical Support [place] [facility] Direct Dial: [phone]

## 2022-12-13 NOTE — Telephone Encounter (Signed)
We do not have the [place] 2 in office. We only have the [place] 3's available.   I called pt to give her the number given by our rep to request a overnight replacement but pt states that she has already received a replacement at no charge & she received the info to return the broken one.

## 2022-12-14 DIAGNOSIS — N184 Chronic kidney disease, stage 4 (severe): Secondary | ICD-10-CM | POA: Diagnosis not present

## 2022-12-14 DIAGNOSIS — E1122 Type 2 diabetes mellitus with diabetic chronic kidney disease: Secondary | ICD-10-CM | POA: Diagnosis not present

## 2022-12-14 DIAGNOSIS — N2581 Secondary hyperparathyroidism of renal origin: Secondary | ICD-10-CM | POA: Diagnosis not present

## 2022-12-14 DIAGNOSIS — I10 Essential (primary) hypertension: Secondary | ICD-10-CM | POA: Diagnosis not present

## 2022-12-15 DIAGNOSIS — H02831 Dermatochalasis of right upper eyelid: Secondary | ICD-10-CM | POA: Diagnosis not present

## 2022-12-21 DIAGNOSIS — H538 Other visual disturbances: Secondary | ICD-10-CM | POA: Diagnosis not present

## 2022-12-21 DIAGNOSIS — E119 Type 2 diabetes mellitus without complications: Secondary | ICD-10-CM | POA: Diagnosis not present

## 2022-12-21 LAB — HM DIABETES EYE EXAM

## 2023-01-05 ENCOUNTER — Ambulatory Visit: Payer: Medicare Other | Admitting: Family Medicine

## 2023-01-06 ENCOUNTER — Encounter: Payer: Self-pay | Admitting: Family Medicine

## 2023-01-06 ENCOUNTER — Ambulatory Visit (INDEPENDENT_AMBULATORY_CARE_PROVIDER_SITE_OTHER): Payer: Medicare Other | Admitting: Family Medicine

## 2023-01-06 VITALS — BP 122/74 | HR 65 | Temp 97.9°F | Ht 63.0 in | Wt 128.8 lb

## 2023-01-06 DIAGNOSIS — E785 Hyperlipidemia, unspecified: Secondary | ICD-10-CM | POA: Diagnosis not present

## 2023-01-06 DIAGNOSIS — Z794 Long term (current) use of insulin: Secondary | ICD-10-CM | POA: Diagnosis not present

## 2023-01-06 DIAGNOSIS — E1122 Type 2 diabetes mellitus with diabetic chronic kidney disease: Secondary | ICD-10-CM | POA: Diagnosis not present

## 2023-01-06 DIAGNOSIS — N184 Chronic kidney disease, stage 4 (severe): Secondary | ICD-10-CM | POA: Diagnosis not present

## 2023-01-06 LAB — HEMOGLOBIN A1C: Hgb A1c MFr Bld: 10 % — ABNORMAL HIGH (ref 4.6–6.5)

## 2023-01-06 NOTE — Assessment & Plan Note (Signed)
Poorly controlled.  Check A1c.  Continue Basaglar 8 units daily.  She will continue to follow with endocrinology.

## 2023-01-06 NOTE — Progress Notes (Signed)
[name], MD Phone: [phone]  Kelly [name] is a 82 y.o. female who presents today for f/u.  DIABETES Disease Monitoring: Blood Sugar ranges-elevated above 200s Polyuria/phagia/dipsia- no      Optho- UTD Medications: Compliance- taking basaglar 8 u daily Hypoglycemic symptoms- no  HYPERLIPIDEMIA Symptoms Chest pain on exertion:  no   Medications: Compliance- taking crestor Right upper quadrant pain- no  Muscle aches- no Lipid Panel     Component Value Date/Time   CHOL 138 07/07/2022 0751   TRIG 109.0 07/07/2022 0751   HDL 42.20 07/07/2022 0751   CHOLHDL 3 07/07/2022 0751   VLDL 21.8 07/07/2022 0751   LDLCALC 74 07/07/2022 0751   LDLDIRECT 78.0 02/18/2019 0857      Social History   Tobacco Use  Smoking Status Never  Smokeless Tobacco Never    Current Outpatient Medications on File Prior to Visit  Medication Sig Dispense Refill   amLODipine (NORVASC) 10 MG tablet Take 1 tablet (10 mg total) by mouth daily. 90 tablet 3   aspirin EC 81 MG tablet Take 81 mg by mouth daily.     calcitRIOL (ROCALTROL) 0.25 MCG capsule Take 0.25 mcg by mouth daily.     Calcium Citrate-Vitamin D 315-5 MG-MCG TABS Take 1 tablet by mouth daily.     Cholecalciferol (VITAMIN D) 50 MCG (2000 UT) CAPS Take 2,000 Units by mouth daily.      Continuous Glucose Sensor (FREESTYLE LIBRE 2 SENSOR) MISC USE TO CHECK SUGAR AT LEAST4 TIMES A DAY 1 each 0   cyclobenzaprine (FLEXERIL) 10 MG tablet TAKE 1/2 TABLET (5MG  TOTAL)3 TIMES A DAY AS NEEDED    FOR MUSCLE SPASMS 45 tablet 0   diclofenac (FLECTOR) 1.3 % PTCH 1 patch 2 (two) times daily.     furosemide (LASIX) 40 MG tablet Take 40 mg by mouth daily as needed.     glucosamine-chondroitin 500-400 MG tablet Take 1 tablet by mouth 3 (three) times daily.     glucose blood (ACCU-CHEK AVIVA PLUS) test strip Use to check blood sugar up to 4 times daily 100 each 3   HYDROcodone-acetaminophen (NORCO/VICODIN) 5-325 MG tablet Take 1 tablet by  mouth every 4 (four) hours as needed.     Insulin Glargine (BASAGLAR KWIKPEN) 100 UNIT/ML Inject 6 Units into the skin at bedtime. 6 mL 3   Insulin Pen Needle (BD PEN NEEDLE NANO 2ND GEN) 32G X 4 MM MISC USE DAILY WITH VICTOZA AND BASAGLAR 180 each 2   Lancets MISC Use up to 4 times daily to check blood sugars. 200 each 11   losartan (COZAAR) 50 MG tablet Take 1 tablet (50 mg total) by mouth daily. 90 tablet 1   Melatonin 5 MG CHEW Chew 5 mg by mouth at bedtime as needed.     NARCAN 4 MG/0.1ML LIQD nasal spray kit      ondansetron (ZOFRAN-ODT) 4 MG disintegrating tablet Take by mouth.     potassium chloride (K-DUR) 10 MEQ tablet Take 10 mEq by mouth daily as needed.     rosuvastatin (CRESTOR) 10 MG tablet Take 1 tablet (10 mg total) by mouth daily. 90 tablet 3   sertraline (ZOLOFT) 50 MG tablet Take 1 tablet (50 mg total) by mouth daily. 7 tablet 0   sertraline (ZOLOFT) 50 MG tablet Take 1 tablet (50 mg total) by mouth daily. 30 tablet 0   Current Facility-Administered Medications on File Prior to Visit  Medication Dose Route Frequency Provider Last Rate Last Admin  denosumab (PROLIA) injection 60 mg  60 mg Subcutaneous Q6 months Glori Luis, MD   60 mg at 04/05/21 1307     ROS see history of present illness  Objective  Physical Exam Vitals:   01/06/23 1412  BP: 122/74  Pulse: 65  Temp: 97.9 F (36.6 C)  SpO2: 98%    BP Readings from Last 3 Encounters:  01/06/23 122/74  10/06/22 (!) 146/78  09/05/22 126/76   Wt Readings from Last 3 Encounters:  01/06/23 128 lb 12.8 oz (58.4 kg)  10/06/22 128 lb 12.8 oz (58.4 kg)  09/05/22 127 lb (57.6 kg)    Physical Exam Constitutional:      General: She is not in acute distress.    Appearance: She is not diaphoretic.  Cardiovascular:     Rate and Rhythm: Normal rate and regular rhythm.     Heart sounds: Normal heart sounds.  Pulmonary:     Effort: Pulmonary effort is normal.     Breath sounds: Normal breath sounds.   Skin:    General: Skin is warm and dry.  Neurological:     Mental Status: She is alert.      Assessment/Plan: Please see individual problem list.  Type 2 diabetes mellitus with stage 4 chronic kidney disease, with long-term current use of insulin (HCC) Assessment & Plan: Poorly controlled.  Check A1c.  Continue Basaglar 8 units daily.  She will continue to follow with endocrinology.  Orders: -     Hemoglobin A1c  Hyperlipidemia, unspecified hyperlipidemia type Assessment & Plan: Chronic issue.  Continue Crestor 10 mg daily.  Check direct LDL.     Return in about 3 months (around 04/08/2023) for Diabetes.   [name], MD [facility]

## 2023-01-06 NOTE — Assessment & Plan Note (Signed)
 Chronic issue.  Continue Crestor 10 mg daily.  Check direct LDL.

## 2023-01-10 ENCOUNTER — Telehealth: Payer: Self-pay

## 2023-01-10 ENCOUNTER — Ambulatory Visit (INDEPENDENT_AMBULATORY_CARE_PROVIDER_SITE_OTHER): Payer: Medicare Other | Admitting: Nurse Practitioner

## 2023-01-10 ENCOUNTER — Encounter: Payer: Self-pay | Admitting: Nurse Practitioner

## 2023-01-10 VITALS — BP 137/66 | HR 66 | Ht 63.0 in | Wt 130.6 lb

## 2023-01-10 DIAGNOSIS — N184 Chronic kidney disease, stage 4 (severe): Secondary | ICD-10-CM

## 2023-01-10 DIAGNOSIS — N1832 Chronic kidney disease, stage 3b: Secondary | ICD-10-CM

## 2023-01-10 DIAGNOSIS — Z794 Long term (current) use of insulin: Secondary | ICD-10-CM

## 2023-01-10 DIAGNOSIS — E1122 Type 2 diabetes mellitus with diabetic chronic kidney disease: Secondary | ICD-10-CM

## 2023-01-10 MED ORDER — BASAGLAR KWIKPEN 100 UNIT/ML ~~LOC~~ SOPN: 10.0000 [IU] | PEN_INJECTOR | Freq: Every day | SUBCUTANEOUS | 3 refills | Status: DC

## 2023-01-10 MED ORDER — BD PEN NEEDLE NANO 2ND GEN 32G X 4 MM MISC: 6 refills | Status: DC

## 2023-01-10 MED ORDER — FREESTYLE LIBRE 2 SENSOR MISC: 3 refills | Status: DC

## 2023-01-10 MED ORDER — ACCU-CHEK AVIVA PLUS VI STRP: ORAL_STRIP | 3 refills | Status: DC

## 2023-01-10 NOTE — Telephone Encounter (Signed)
Printed and put up front in the Patient pick up folder.

## 2023-01-10 NOTE — Progress Notes (Signed)
Endocrinology Follow Up Note       01/10/2023, 3:53 PM   Subjective:    Patient ID: Kelly [name], female    DOB: [birth year].  Kelly [name] is being seen in follow up after being seen in consultation for management of currently uncontrolled symptomatic diabetes requested by  Glori Luis, MD.   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     Past Surgical History:  Procedure Laterality Date   APPENDECTOMY     BREAST BIOPSY Right 06/19/2018   affirm stereo/x clip/COLUMNAR CELL CHANGE WITH MICROCALCIFICATIONS. FIBROCYSTIC CHANGES WITH MICROCALCIFICATIONS   cataract  03/2017   CHOLECYSTECTOMY     COLONOSCOPY     COLONOSCOPY WITH PROPOFOL N/A 08/22/2017   Procedure: COLONOSCOPY WITH PROPOFOL;  Surgeon: Wyline Mood, MD;  Location: [facility] ENDOSCOPY;  Service: Gastroenterology;  Laterality: N/A;   ESOPHAGOGASTRODUODENOSCOPY     ESOPHAGOGASTRODUODENOSCOPY (EGD) WITH PROPOFOL N/A 03/23/2016   Procedure: ESOPHAGOGASTRODUODENOSCOPY (EGD) WITH PROPOFOL;  Surgeon: Scot Jun, MD;  Location: [facility] ENDOSCOPY;  Service: Endoscopy;  Laterality: N/A;   FLEXIBLE SIGMOIDOSCOPY N/A 02/06/2018   Procedure: FLEXIBLE SIGMOIDOSCOPY;  Surgeon: Wyline Mood, MD;  Location: [facility] ENDOSCOPY;  Service: Gastroenterology;  Laterality: N/A;   FLEXIBLE SIGMOIDOSCOPY N/A 12/03/2018   Procedure: FLEXIBLE SIGMOIDOSCOPY;  Surgeon: Wyline Mood, MD;  Location: [facility] ENDOSCOPY;  Service: Gastroenterology;  Laterality: N/A;   RECTAL PROLAPSE REPAIR  2012   x2    Social History   Socioeconomic History   Marital status: Widowed    Spouse name: Not on file   Number  of children: Not on file   Years of education: Not on file   Highest education level: High school graduate  Occupational History   Occupation: retired   Tobacco Use   Smoking status: Never   Smokeless tobacco: Never  Vaping Use   Vaping status: Never Used  Substance and Sexual Activity   Alcohol use: No   Drug use: No   Sexual activity: Not Currently    Partners: Male  Other Topics Concern   Not on file  Social History Narrative   Not on file   Social Determinants of Health   Financial Resource Strain: Low Risk  (05/27/2022)   Received from [facility]   Overall Financial Resource Strain (CARDIA)    Difficulty of Paying Living Expenses: Not hard at all  Food Insecurity: No Food Insecurity (05/30/2022)   Hunger Vital Sign    Worried About Running Out of Food in the Last Year: Never true    Ran Out of Food in the Last Year: Never true  Transportation Needs: No Transportation Needs (05/30/2022)   PRAPARE - Administrator, Civil Service (Medical): No    Lack of Transportation (Non-Medical): No  Physical Activity: Insufficiently Active (11/03/2020)   Exercise Vital  Sign    Days of Exercise per Week: 3 days    Minutes of Exercise per Session: 20 min  Stress: No Stress Concern Present (11/03/2020)   [employer] of Occupational Health - Occupational Stress Questionnaire    Feeling of Stress : Only a little  Social Connections: Unknown (11/03/2020)   Social Connection and Isolation Panel [NHANES]    Frequency of Communication with Friends and Family: More than three times a week    Frequency of Social Gatherings with Friends and Family: Not on file    Attends Religious Services: Not on file    Active Member of Clubs or Organizations: Not on file    Attends Banker Meetings: Not on file    Marital Status: Not on file    Family History  Problem Relation Age of Onset   Stroke Mother    Arthritis Mother    Aortic aneurysm Sister     Lung cancer Sister    Heart attack Father 66   Breast cancer Sister 98    Outpatient Encounter Medications as of 01/10/2023  Medication Sig   amLODipine (NORVASC) 10 MG tablet Take 1 tablet (10 mg total) by mouth daily.   aspirin EC 81 MG tablet Take 81 mg by mouth daily.   calcitRIOL (ROCALTROL) 0.25 MCG capsule Take 0.25 mcg by mouth daily.   Calcium Citrate-Vitamin D 315-5 MG-MCG TABS Take 1 tablet by mouth daily.   Cholecalciferol (VITAMIN D) 50 MCG (2000 UT) CAPS Take 2,000 Units by mouth daily.    cyclobenzaprine (FLEXERIL) 10 MG tablet TAKE 1/2 TABLET (5MG  TOTAL)3 TIMES A DAY AS NEEDED    FOR MUSCLE SPASMS   diclofenac (FLECTOR) 1.3 % PTCH 1 patch 2 (two) times daily.   furosemide (LASIX) 40 MG tablet Take 40 mg by mouth daily as needed.   glucosamine-chondroitin 500-400 MG tablet Take 1 tablet by mouth 3 (three) times daily.   HYDROcodone-acetaminophen (NORCO/VICODIN) 5-325 MG tablet Take 1 tablet by mouth every 4 (four) hours as needed.   Lancets MISC Use up to 4 times daily to check blood sugars.   losartan (COZAAR) 50 MG tablet Take 1 tablet (50 mg total) by mouth daily.   NARCAN 4 MG/0.1ML LIQD nasal spray kit    ondansetron (ZOFRAN-ODT) 4 MG disintegrating tablet Take by mouth.   potassium chloride (K-DUR) 10 MEQ tablet Take 10 mEq by mouth daily as needed.   rosuvastatin (CRESTOR) 10 MG tablet Take 1 tablet (10 mg total) by mouth daily.   sertraline (ZOLOFT) 50 MG tablet Take 1 tablet (50 mg total) by mouth daily.   sertraline (ZOLOFT) 50 MG tablet Take 1 tablet (50 mg total) by mouth daily.   [DISCONTINUED] Continuous Glucose Sensor (FREESTYLE LIBRE 2 SENSOR) MISC USE TO CHECK SUGAR AT LEAST4 TIMES A DAY   [DISCONTINUED] glucose blood (ACCU-CHEK AVIVA PLUS) test strip Use to check blood sugar up to 4 times daily   [DISCONTINUED] Insulin Glargine (BASAGLAR KWIKPEN) 100 UNIT/ML Inject 6 Units into the skin at bedtime.   [DISCONTINUED] Insulin Pen Needle (BD PEN NEEDLE NANO  2ND GEN) 32G X 4 MM MISC USE DAILY WITH VICTOZA AND BASAGLAR   Continuous Glucose Sensor (FREESTYLE LIBRE 2 SENSOR) MISC Use to check glucose 4 times daily   glucose blood (ACCU-CHEK AVIVA PLUS) test strip Use to check blood sugar up to 4 times daily   Insulin Glargine (BASAGLAR KWIKPEN) 100 UNIT/ML Inject 10 Units into the skin at bedtime.   Insulin Pen Needle (  BD PEN NEEDLE NANO 2ND GEN) 32G X 4 MM MISC Use to monitor glucose twice daily   Melatonin 5 MG CHEW Chew 5 mg by mouth at bedtime as needed.   Facility-Administered Encounter Medications as of 01/10/2023  Medication   denosumab (PROLIA) injection 60 mg    ALLERGIES: Allergies  Allergen Reactions   Prilosec Otc [Omeprazole Magnesium] Other (See Comments)    Maybe cause of acute interstitial nephritis   Sucralfate Other (See Comments)    Maybe cause of acute interstitial nephritis   Amoxicillin Diarrhea   Morphine And Codeine Other (See Comments)    Chest pains Chest pains    VACCINATION STATUS: Immunization History  Administered Date(s) Administered   Fluad Quad(high Dose 65+) 02/25/2019, 03/02/2020, 05/17/2021, 04/08/2022   Influenza, High Dose Seasonal PF 02/24/2017, 02/28/2018   Influenza,inj,quad, With Preservative 02/19/2016   Moderna Sars-Covid-2 Vaccination 07/05/2019, 08/12/2019, 03/02/2020, 08/10/2020   PNEUMOCOCCAL CONJUGATE-20 07/02/2021   Td 02/26/2019   Zoster Recombinant(Shingrix) 01/02/2020, 03/10/2020    Diabetes She presents for her follow-up diabetic visit. She has type 2 diabetes mellitus. Onset time: diagnosed at approx age of 27. Her disease course has been worsening. There are no hypoglycemic associated symptoms. There are no hypoglycemic complications. Diabetic complications include nephropathy. (gastroparesis) Risk factors for coronary artery disease include diabetes mellitus and post-menopausal. Current diabetic treatment includes insulin injections and diet. She is compliant with treatment most of  the time. Her weight is fluctuating minimally. She is following a generally unhealthy diet. When asked about meal planning, she reported none. She has not had a previous visit with a dietitian. She rarely participates in exercise. Her home blood glucose trend is increasing steadily. Her overall blood glucose range is >200 mg/dl. (She presents today, accompanied by her daughter, with her CGM showing above target glycemic profile overall.  Her previsit A1c checked by PCP on 8/30 was 10%, increasing from last visit of 8.9%.  She admits she has been eating things she knows she shouldn't.  Her daughter usually keeps her in line with diet but has been out of state caring for another family member.  Analysis of her CGM shows TIR 14%, TAR 86%, TBR 0% with a GMI of 10.1%.  She denies any hypoglycemia.  She does not have any pattern with her day, sometimes will stay up late, sometimes doesn't take her insulin at same time each day.  She did increase her insulin to 8 units about 3 weeks ago due to high sugars.) An ACE inhibitor/angiotensin II receptor blocker is being taken. She does not see a podiatrist.Eye exam is current.    Review of systems  Constitutional: + Minimally fluctuating body weight,  current Body mass index is 23.13 kg/m. , no fatigue, no subjective hyperthermia, no subjective hypothermia Eyes: no blurry vision, no xerophthalmia ENT: no sore throat, no nodules palpated in throat, no dysphagia/odynophagia, no hoarseness Cardiovascular: no chest pain, no shortness of breath, no palpitations, no leg swelling Respiratory: no cough, no shortness of breath Gastrointestinal: no nausea/vomiting/diarrhea Musculoskeletal: no muscle/joint aches Skin: no rashes, no hyperemia Neurological: no tremors, no numbness, no tingling, no dizziness Psychiatric: no depression, no anxiety  Objective:     BP 137/66 (BP Location: Right Arm, Patient Position: Sitting, Cuff Size: Large)   Pulse 66   Ht 5\' 3"  (1.6 m)    Wt 130 lb 9.6 oz (59.2 kg)   BMI 23.13 kg/m   Wt Readings from Last 3 Encounters:  01/10/23 130 lb 9.6 oz (59.2 kg)  01/06/23 128 lb 12.8 oz (58.4 kg)  10/06/22 128 lb 12.8 oz (58.4 kg)     BP Readings from Last 3 Encounters:  01/10/23 137/66  01/06/23 122/74  10/06/22 (!) 146/78      Physical Exam- Limited  Constitutional:  Body mass index is 23.13 kg/m. , not in acute distress, normal state of mind Eyes:  EOMI, no exophthalmos Neck: Supple Musculoskeletal: no gross deformities, strength intact in all four extremities, no gross restriction of joint movements Skin:  no rashes, no hyperemia Neurological: no tremor with outstretched hands   Diabetic Foot Exam - Simple   No data filed     CMP ( most recent) CMP     Component Value Date/Time   NA 142 04/26/2022 1823   NA 144 07/05/2018 0000   NA 137 01/18/2013 1036   K 3.7 04/26/2022 1823   K 3.9 01/18/2013 1036   CL 105 04/26/2022 1823   CL 103 01/18/2013 1036   CO2 29 04/26/2022 1823   CO2 26 01/18/2013 1036   GLUCOSE 89 04/26/2022 1823   GLUCOSE 342 (H) 01/18/2013 1036   BUN 26 (H) 04/26/2022 1823   BUN 35 (A) 07/05/2018 0000   BUN 14 01/18/2013 1036   CREATININE 1.77 (H) 04/26/2022 1823   CREATININE 1.48 (H) 01/18/2013 1036   CALCIUM 9.3 04/26/2022 1823   CALCIUM 9.3 01/18/2013 1036   PROT 6.1 09/23/2022 1545   PROT 6.2 (L) 01/18/2013 1036   ALBUMIN 4.1 08/29/2022 0953   ALBUMIN 3.3 (L) 01/18/2013 1036   AST 24 09/23/2022 1545   AST 22 01/18/2013 1036   ALT 23 09/23/2022 1545   ALT 46 01/18/2013 1036   ALKPHOS 84 08/29/2022 0953   ALKPHOS 120 01/18/2013 1036   BILITOT 0.8 09/23/2022 1545   BILITOT 0.6 01/18/2013 1036   GFRNONAA 29 (L) 04/26/2022 1823   GFRNONAA 35 (L) 01/18/2013 1036   GFRAA 19 (L) 10/20/2019 1735   GFRAA 41 (L) 01/18/2013 1036     Diabetic Labs (most recent): Lab Results  Component Value Date   HGBA1C 10.0 (H) 01/06/2023   HGBA1C 8.9 (A) 10/06/2022   HGBA1C 7.0 (H)  07/07/2022   MICROALBUR 4.5 (H) 08/02/2017     Lipid Panel ( most recent) Lipid Panel     Component Value Date/Time   CHOL 138 07/07/2022 0751   TRIG 109.0 07/07/2022 0751   HDL 42.20 07/07/2022 0751   CHOLHDL 3 07/07/2022 0751   VLDL 21.8 07/07/2022 0751   LDLCALC 74 07/07/2022 0751   LDLDIRECT 78.0 02/18/2019 0857      Lab Results  Component Value Date   TSH 4.41 11/08/2021   TSH 3.93 03/22/2017   TSH 0.719 07/09/2016           Assessment & Plan:   1) Type 2 diabetes mellitus with stage 3b chronic kidney disease, with long-term current use of insulin (HCC)  She presents today, accompanied by her daughter, with her CGM showing above target glycemic profile overall.  Her previsit A1c checked by PCP on 8/30 was 10%, increasing from last visit of 8.9%.  She admits she has been eating things she knows she shouldn't.  Her daughter usually keeps her in line with diet but has been out of state caring for another family member.  Analysis of her CGM shows TIR 14%, TAR 86%, TBR 0% with a GMI of 10.1%.  She denies any hypoglycemia.  She does not have any pattern with her day, sometimes will stay up  late, sometimes doesn't take her insulin at same time each day.  She did increase her insulin to 8 units about 3 weeks ago due to high sugars.  - Kelly [name] has currently uncontrolled symptomatic type 2 DM since 82 years of age.   -Recent labs reviewed.  - I had a long discussion with her about the progressive nature of diabetes and the pathology behind its complications. 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 These are all discussed in detail with her.  The following Lifestyle Medicine recommendations according to American College of Lifestyle Medicine [facility]) were discussed and offered to patient and she agrees to start the  journey:  A. Whole Foods, Plant-based plate comprising of fruits and vegetables, plant-based proteins, whole-grain carbohydrates was discussed in detail with the patient.   A list for source of those nutrients were also provided to the patient.  Patient will use only water or unsweetened tea for hydration. B.  The need to stay away from risky substances including alcohol, smoking; obtaining 7 to 9 hours of restorative sleep, at least 150 minutes of moderate intensity exercise weekly, the importance of healthy social connections,  and stress reduction techniques were discussed. C.  A full color page of  Calorie density of various food groups per pound showing examples of each food groups was provided to the patient.  - Nutritional counseling repeated at each appointment due to patients tendency to fall back in to old habits.  - The patient admits there is a room for improvement in their diet and drink choices. -  Suggestion is made for the patient to avoid simple carbohydrates from their diet including Cakes, Sweet Desserts / Pastries, Ice Cream, Soda (diet and regular), Sweet Tea, Candies, Chips, Cookies, Sweet Pastries, Store Bought Juices, Alcohol in Excess of 1-2 drinks a day, Artificial Sweeteners, Coffee Creamer, and "Sugar-free" Products. This will help patient to have stable blood glucose profile and potentially avoid unintended weight gain.   - I encouraged the patient to switch to unprocessed or minimally processed complex starch and increased protein intake (animal or plant source), fruits, and vegetables.   - Patient is advised to stick to a routine mealtimes to eat 3 meals a day and avoid unnecessary snacks (to snack only to correct hypoglycemia).  - I have approached her with the following individualized plan to manage her diabetes and patient agrees:   -Avoiding hypoglycemia is a top priority in her care.  A good goal A1c for her would be 7%.  -She is advised to increase her Basaglar  to 10 units SQ daily at noon and to be more consistent with taking it at the same time each day.  We discussed the importance of restarting her dietary regimen, avoiding concentrated sweets.  We discussed the importance of routine, eating on a specific pattern so that her metabolism can correct itself.  -she is encouraged to continue monitoring glucose at least 2 times daily (using her CGM), before breakfast and before bed, and to call the clinic if she has readings less than 70 or above 300 for 3 tests in a row.  - she is warned not to take insulin without proper monitoring per orders. - Adjustment parameters are given to her for hypo and hyperglycemia in writing.  -She is not an ideal candidate for incretin therapy due to body habitus with  BMI of 22.  - Specific targets for  A1c; LDL, HDL, and Triglycerides were discussed with the patient.  2) Blood Pressure /Hypertension:  her blood pressure is controlled to target for her age.   she is advised to continue her current medications including Losartan 50 mg p.o. daily with breakfast, and Norvasc 10 mg po daily.  She also takes Lasix 40 po daily as needed for fluid.  She does see nephrology, will defer any med changes to them.  3) Lipids/Hyperlipidemia:    Review of her recent lipid panel from 07/07/22 showed controlled LDL at 74 .  she is advised to continue Crestor 10 mg daily at bedtime.  Side effects and precautions discussed with her.  4)  Weight/Diet:  her Body mass index is 23.13 kg/m.  -    she is NOT a candidate for weight loss.   Exercise, and detailed carbohydrates information provided  -  detailed on discharge instructions.  5) Chronic Care/Health Maintenance: -she is on ACEI/ARB and Statin medications and is encouraged to initiate and continue to follow up with Ophthalmology, Dentist, Podiatrist at least yearly or according to recommendations, and advised to stay away from smoking. I have recommended yearly flu vaccine and pneumonia  vaccine at least every 5 years; moderate intensity exercise for up to 150 minutes weekly; and sleep for at least 7 hours a day.  - she is advised to maintain close follow up with Birdie Sons Yehuda Mao, MD for primary care needs, as well as her other providers for optimal and coordinated care.     I spent  51  minutes in the care of the patient today including review of labs from CMP, Lipids, Thyroid Function, Hematology (current and previous including abstractions from other facilities); face-to-face time discussing  her blood glucose readings/logs, discussing hypoglycemia and hyperglycemia episodes and symptoms, medications doses, her options of short and long term treatment based on the latest standards of care / guidelines;  discussion about incorporating lifestyle medicine;  and documenting the encounter. Risk reduction counseling performed per USPSTF guidelines to reduce obesity and cardiovascular risk factors.     Please refer to Patient Instructions for Blood Glucose Monitoring and Insulin/Medications Dosing Guide"  in media tab for additional information. Please  also refer to " Patient Self Inventory" in the Media  tab for reviewed elements of pertinent patient history.  Adel Gershon Cull participated in the discussions, expressed understanding, and voiced agreement with the above plans.  All questions were answered to her satisfaction. she is encouraged to contact clinic should she have any questions or concerns prior to her return visit.     Follow up plan: - Return in about 3 months (around 04/11/2023) for Diabetes F/U with A1c in office, No previsit labs, Bring meter and logs.   Ronny Bacon, [facility] [address] [place], [place] [address] Phone: [phone] Fax: [phone]  01/10/2023, 3:53 PM

## 2023-01-10 NOTE — Telephone Encounter (Signed)
Patient states she would like for [place] to print a copy of her lab work and leave it up front for her to pick up.  Patient states she would like to pick it up later today.

## 2023-01-18 ENCOUNTER — Telehealth: Payer: Self-pay | Admitting: Nurse Practitioner

## 2023-01-18 NOTE — Telephone Encounter (Signed)
I will remove them.  I merely recommend keeping a back up meter on hand just in case the CGM falls off, quits working, or is simply inaccurate.

## 2023-01-18 NOTE — Telephone Encounter (Signed)
Pt states she was sent accu chek strips at her 9/3 visit and she does not use those she is on a CGM. She said please remove this from her chart, as this cost her $50.

## 2023-01-23 DIAGNOSIS — D2261 Melanocytic nevi of right upper limb, including shoulder: Secondary | ICD-10-CM | POA: Diagnosis not present

## 2023-01-23 DIAGNOSIS — D2262 Melanocytic nevi of left upper limb, including shoulder: Secondary | ICD-10-CM | POA: Diagnosis not present

## 2023-01-23 DIAGNOSIS — D225 Melanocytic nevi of trunk: Secondary | ICD-10-CM | POA: Diagnosis not present

## 2023-01-23 DIAGNOSIS — L570 Actinic keratosis: Secondary | ICD-10-CM | POA: Diagnosis not present

## 2023-01-23 DIAGNOSIS — Z85828 Personal history of other malignant neoplasm of skin: Secondary | ICD-10-CM | POA: Diagnosis not present

## 2023-01-23 DIAGNOSIS — D2272 Melanocytic nevi of left lower limb, including hip: Secondary | ICD-10-CM | POA: Diagnosis not present

## 2023-01-31 DIAGNOSIS — T300 Burn of unspecified body region, unspecified degree: Secondary | ICD-10-CM | POA: Insufficient documentation

## 2023-01-31 DIAGNOSIS — R52 Pain, unspecified: Secondary | ICD-10-CM | POA: Insufficient documentation

## 2023-01-31 DIAGNOSIS — M533 Sacrococcygeal disorders, not elsewhere classified: Secondary | ICD-10-CM | POA: Insufficient documentation

## 2023-01-31 DIAGNOSIS — M546 Pain in thoracic spine: Secondary | ICD-10-CM | POA: Insufficient documentation

## 2023-01-31 DIAGNOSIS — M461 Sacroiliitis, not elsewhere classified: Secondary | ICD-10-CM | POA: Insufficient documentation

## 2023-01-31 DIAGNOSIS — M25579 Pain in unspecified ankle and joints of unspecified foot: Secondary | ICD-10-CM | POA: Insufficient documentation

## 2023-01-31 DIAGNOSIS — M25559 Pain in unspecified hip: Secondary | ICD-10-CM | POA: Insufficient documentation

## 2023-02-07 DIAGNOSIS — M6281 Muscle weakness (generalized): Secondary | ICD-10-CM | POA: Diagnosis not present

## 2023-02-07 DIAGNOSIS — M5032 Other cervical disc degeneration, mid-cervical region, unspecified level: Secondary | ICD-10-CM | POA: Diagnosis not present

## 2023-02-07 DIAGNOSIS — Z79899 Other long term (current) drug therapy: Secondary | ICD-10-CM | POA: Diagnosis not present

## 2023-02-07 DIAGNOSIS — M810 Age-related osteoporosis without current pathological fracture: Secondary | ICD-10-CM | POA: Diagnosis not present

## 2023-02-07 DIAGNOSIS — M461 Sacroiliitis, not elsewhere classified: Secondary | ICD-10-CM | POA: Diagnosis not present

## 2023-02-07 DIAGNOSIS — Q762 Congenital spondylolisthesis: Secondary | ICD-10-CM | POA: Diagnosis not present

## 2023-02-07 DIAGNOSIS — M7062 Trochanteric bursitis, left hip: Secondary | ICD-10-CM | POA: Diagnosis not present

## 2023-02-07 DIAGNOSIS — M47812 Spondylosis without myelopathy or radiculopathy, cervical region: Secondary | ICD-10-CM | POA: Diagnosis not present

## 2023-02-07 DIAGNOSIS — T2101XA Burn of unspecified degree of chest wall, initial encounter: Secondary | ICD-10-CM | POA: Diagnosis not present

## 2023-02-07 DIAGNOSIS — M533 Sacrococcygeal disorders, not elsewhere classified: Secondary | ICD-10-CM | POA: Diagnosis not present

## 2023-02-07 DIAGNOSIS — E1165 Type 2 diabetes mellitus with hyperglycemia: Secondary | ICD-10-CM | POA: Diagnosis not present

## 2023-02-07 DIAGNOSIS — M25572 Pain in left ankle and joints of left foot: Secondary | ICD-10-CM | POA: Diagnosis not present

## 2023-02-07 DIAGNOSIS — Z5181 Encounter for therapeutic drug level monitoring: Secondary | ICD-10-CM | POA: Diagnosis not present

## 2023-02-13 ENCOUNTER — Ambulatory Visit (INDEPENDENT_AMBULATORY_CARE_PROVIDER_SITE_OTHER): Payer: Medicare Other | Admitting: *Deleted

## 2023-02-13 ENCOUNTER — Telehealth: Payer: Self-pay | Admitting: *Deleted

## 2023-02-13 VITALS — Ht 63.0 in | Wt 133.0 lb

## 2023-02-13 DIAGNOSIS — Z0000 Encounter for general adult medical examination without abnormal findings: Secondary | ICD-10-CM | POA: Diagnosis not present

## 2023-02-13 NOTE — Progress Notes (Signed)
Subjective:   Kelly [name] is a 82 y.o. female who presents for Medicare Annual (Subsequent) preventive examination.  Visit Complete: Virtual  I connected with  Kelly [name] on 02/13/23 by a audio enabled telemedicine application and verified that I am speaking with the correct person using two identifiers.  Patient Location: Home  Provider Location: Office/Clinic  I discussed the limitations of evaluation and management by telemedicine. The patient expressed understanding and agreed to proceed.  Because this visit was a virtual/telehealth visit, some criteria may be missing or patient reported. Any vitals not documented were not able to be obtained and vitals that have been documented are patient reported.    Cardiac Risk Factors include: advanced age (>80men, >34 women);dyslipidemia;diabetes mellitus;hypertension     Objective:    Today's Vitals   02/13/23 1411 02/13/23 1414  Weight: 133 lb (60.3 kg)   Height: 5\' 3"  (1.6 m)   PainSc:  3    Body mass index is 23.56 kg/m.     02/13/2023    2:30 PM 04/26/2022    6:18 PM 12/09/2021   10:17 AM 12/11/2020    5:10 PM 11/03/2020    9:21 AM 06/16/2020   11:09 AM 05/22/2020    9:30 AM  Advanced Directives  Does Patient Have a Medical Advance Directive? Yes No Yes Yes Yes Yes No  Type of Estate agent of [place];Living will  Healthcare Power of [place];Living will  Living will;Healthcare Power of [employer] Power of [place];Living will   Does patient want to make changes to medical advance directive?   No - Patient declined   No - Patient declined   Copy of Healthcare Power of Attorney in Chart? No - copy requested  No - copy requested  No - copy requested No - copy requested   Would patient like information on creating a medical advance directive?  No - Patient declined     No - Patient declined    Current Medications (verified) Outpatient Encounter Medications as of 02/13/2023   Medication Sig   amLODipine (NORVASC) 10 MG tablet Take 1 tablet (10 mg total) by mouth daily.   aspirin EC 81 MG tablet Take 81 mg by mouth daily.   calcitRIOL (ROCALTROL) 0.25 MCG capsule Take 0.25 mcg by mouth daily.   Calcium Citrate-Vitamin D 315-5 MG-MCG TABS Take 1 tablet by mouth daily.   Cholecalciferol (VITAMIN D) 50 MCG (2000 UT) CAPS Take 2,000 Units by mouth daily.    Continuous Glucose Sensor (FREESTYLE LIBRE 2 SENSOR) MISC Use to check glucose 4 times daily   cyclobenzaprine (FLEXERIL) 10 MG tablet TAKE 1/2 TABLET (5MG  TOTAL)3 TIMES A DAY AS NEEDED    FOR MUSCLE SPASMS   diclofenac (FLECTOR) 1.3 % PTCH 1 patch 2 (two) times daily.   furosemide (LASIX) 40 MG tablet Take 40 mg by mouth daily as needed.   glucosamine-chondroitin 500-400 MG tablet Take 1 tablet by mouth 3 (three) times daily.   HYDROcodone-acetaminophen (NORCO/VICODIN) 5-325 MG tablet Take 1 tablet by mouth every 4 (four) hours as needed.   Insulin Glargine (BASAGLAR KWIKPEN) 100 UNIT/ML Inject 10 Units into the skin at bedtime.   Insulin Pen Needle (BD PEN NEEDLE NANO 2ND GEN) 32G X 4 MM MISC Use to monitor glucose twice daily   Lancets MISC Use up to 4 times daily to check blood sugars.   losartan (COZAAR) 50 MG tablet Take 1 tablet (50 mg total) by mouth daily.   Melatonin 5 MG  CHEW Chew 5 mg by mouth at bedtime as needed.   NARCAN 4 MG/0.1ML LIQD nasal spray kit    ondansetron (ZOFRAN-ODT) 4 MG disintegrating tablet Take by mouth.   potassium chloride (K-DUR) 10 MEQ tablet Take 10 mEq by mouth daily as needed.   rosuvastatin (CRESTOR) 10 MG tablet Take 1 tablet (10 mg total) by mouth daily.   sertraline (ZOLOFT) 50 MG tablet Take 1 tablet (50 mg total) by mouth daily.   sertraline (ZOLOFT) 50 MG tablet Take 1 tablet (50 mg total) by mouth daily.   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    Past Surgical History:  Procedure Laterality Date   APPENDECTOMY     BREAST BIOPSY Right 06/19/2018   affirm stereo/x clip/COLUMNAR CELL CHANGE WITH MICROCALCIFICATIONS. FIBROCYSTIC CHANGES WITH MICROCALCIFICATIONS   cataract  03/2017   CHOLECYSTECTOMY     COLONOSCOPY     COLONOSCOPY WITH PROPOFOL N/A 08/22/2017   Procedure: COLONOSCOPY WITH PROPOFOL;  Surgeon: Wyline Mood, MD;  Location[facility] ENDOSCOPY;  Service: Gastroenterology;  Laterality: N/A;   ESOPHAGOGASTRODUODENOSCOPY     ESOPHAGOGASTRODUODENOSCOPY (EGD) WITH PROPOFOL N/A 03/23/2016   Procedure: ESOPHAGOGASTRODUODENOSCOPY (EGD) WITH PROPOFOL;  Surgeon: Scot Jun, MD;  Location: [facility] ENDOSCOPY;  Service: Endoscopy;  Laterality: N/A;   FLEXIBLE SIGMOIDOSCOPY N/A 02/06/2018   Procedure: FLEXIBLE SIGMOIDOSCOPY;  Surgeon: Wyline Mood, MD;  Location: [facility] ENDOSCOPY;  Service: Gastroenterology;  Laterality: N/A;   FLEXIBLE SIGMOIDOSCOPY N/A 12/03/2018   Procedure: FLEXIBLE SIGMOIDOSCOPY;  Surgeon: Wyline Mood, MD;  Location: [facility] ENDOSCOPY;  Service: Gastroenterology;  Laterality: N/A;   RECTAL PROLAPSE REPAIR  2012   x2   skin cancer removed from hand  2024   Family History  Problem Relation Age of Onset   Stroke Mother    Arthritis Mother    Aortic aneurysm Sister    Lung cancer Sister    Heart attack Father 44   Breast cancer Sister 51   Social History   Socioeconomic History   Marital status: Widowed    Spouse name: Not on file   Number of children: Not on file   Years of education: Not on file   Highest education level: High  school graduate  Occupational History   Occupation: retired   Tobacco Use   Smoking status: Never   Smokeless tobacco: Never  Vaping Use   Vaping status: Never Used  Substance and Sexual Activity   Alcohol use: No   Drug use: No   Sexual activity: Not Currently    Partners: Male  Other Topics Concern   Not on file  Social History Narrative   widow   Social Determinants of Health   Financial Resource Strain: Low Risk  (02/13/2023)   Overall Financial Resource Strain (CARDIA)    Difficulty of Paying Living Expenses: Not hard at all  Food Insecurity: No Food Insecurity (02/13/2023)   Hunger Vital Sign    Worried About Programme researcher, broadcasting/film/video in  the Last Year: Never true    Ran Out of Food in the Last Year: Never true  Transportation Needs: No Transportation Needs (02/13/2023)   PRAPARE - Administrator, Civil Service (Medical): No    Lack of Transportation (Non-Medical): No  Physical Activity: Sufficiently Active (02/13/2023)   Exercise Vital Sign    Days of Exercise per Week: 2 days    Minutes of Exercise per Session: 120 min  Stress: No Stress Concern Present (02/13/2023)   [employer] of Occupational Health - Occupational Stress Questionnaire    Feeling of Stress : Not at all  Social Connections: Moderately Integrated (02/13/2023)   Social Connection and Isolation Panel [NHANES]    Frequency of Communication with Friends and Family: More than three times a week    Frequency of Social Gatherings with Friends and Family: Three times a week    Attends Religious Services: More than 4 times per year    Active Member of Clubs or Organizations: Yes    Attends Banker Meetings: More than 4 times per year    Marital Status: Widowed    Tobacco Counseling Counseling given: Not Answered   Clinical Intake:  Pre-visit preparation completed: Yes  Pain : 0-10 Pain Score: 3  Pain Type: Chronic pain Pain Location: Back Pain Orientation: Lower, Upper Pain  Descriptors / Indicators: Constant Pain Onset: More than a month ago Pain Frequency: Constant     BMI - recorded: 23.56 Nutritional Status: BMI of 19-24  Normal Nutritional Risks: None Diabetes: Yes CBG done?: Yes (blood sugar350 has popcorn and pepsi at 3:00 am) CBG resulted in Enter/ Edit results?: No Did pt. bring in CBG monitor from home?: No  How often do you need to have someone help you when you read instructions, pamphlets, or other written materials from your doctor or pharmacy?: 1 - Never  Interpreter Needed?: No  Information entered by :: R. Constantino Starace LPN   Activities of Daily Living    02/13/2023    2:18 PM  In your present state of health, do you have any difficulty performing the following activities:  Hearing? 1  Comment wears aids  Vision? 0  Comment readers  Difficulty concentrating or making decisions? 0  Walking or climbing stairs? 0  Dressing or bathing? 0  Doing errands, shopping? 0  Preparing Food and eating ? N  Using the Toilet? N  In the past six months, have you accidently leaked urine? Y  Comment wears depends  Do you have problems with loss of bowel control? N  Managing your Medications? N  Managing your Finances? N  Housekeeping or managing your Housekeeping? N    Patient Care Team: Glori Luis, MD as PCP - General (Family Medicine) Mosetta Pigeon, MD (Nephrology) Candelaria Stagers, DPM as Consulting Physician (Podiatry)  Indicate any recent Medical Services you may have received from other than [facility] providers in the past year (date may be approximate).     Assessment:   This is a routine wellness examination for Kelly [name].  Hearing/Vision screen Hearing Screening - Comments:: Wears aids Vision Screening - Comments:: readers   Goals Addressed             This Visit's Progress    Patient Stated       Wants to lose 10 pounds and get diabetes under control.       Depression Screen    02/13/2023    2:24 PM 01/06/2023     2:13 PM  04/08/2022    2:18 PM 12/09/2021   10:18 AM 11/08/2021    1:58 PM 10/05/2021    1:25 PM 10/01/2021    1:54 PM  PHQ 2/9 Scores  PHQ - 2 Score 0 0 0 0 0 0 0  PHQ- 9 Score 3 0  0 0  0    Fall Risk    02/13/2023    2:21 PM 01/06/2023    2:13 PM 09/05/2022   12:01 PM 04/08/2022    2:18 PM 12/09/2021   10:18 AM  Fall Risk   Falls in the past year? 0 1 0 0 0  Number falls in past yr: 0 0 0 0   Injury with Fall? 0 0 0 0 0  Risk for fall due to : No Fall Risks History of fall(s) No Fall Risks No Fall Risks   Follow up Falls prevention discussed;Falls evaluation completed Falls evaluation completed Falls evaluation completed Falls evaluation completed Falls evaluation completed    MEDICARE RISK AT HOME: Medicare Risk at Home Any stairs in or around the home?: Yes If so, are there any without handrails?: No Home free of loose throw rugs in walkways, pet beds, electrical cords, etc?: Yes Adequate lighting in your home to reduce risk of falls?: Yes Life alert?: No Use of a cane, walker or w/c?: No Grab bars in the bathroom?: Yes Shower chair or bench in shower?: Yes Elevated toilet seat or a handicapped toilet?: No     Cognitive Function:        02/13/2023    2:30 PM 11/01/2019    9:16 AM 10/31/2018   10:50 AM 10/25/2017   12:10 PM  6CIT Screen  What Year? 0 points 0 points 0 points 0 points  What month? 0 points 0 points 0 points 0 points  What time? 0 points 0 points 0 points 0 points  Count back from 20 0 points 0 points 0 points 0 points  Months in reverse 0 points 0 points 0 points 0 points  Repeat phrase 0 points 0 points 0 points 0 points  Total Score 0 points 0 points 0 points 0 points    Immunizations Immunization History  Administered Date(s) Administered   Fluad Quad(high Dose 65+) 02/25/2019, 03/02/2020, 05/17/2021, 04/08/2022   Influenza, High Dose Seasonal PF 02/24/2017, 02/28/2018   Influenza,inj,quad, With Preservative 02/19/2016   Moderna Sars-Covid-2  Vaccination 07/05/2019, 08/12/2019, 03/02/2020, 08/10/2020   PNEUMOCOCCAL CONJUGATE-20 07/02/2021   Td 02/26/2019   Zoster Recombinant(Shingrix) 01/02/2020, 03/10/2020    TDAP status: Up to date  Flu Vaccine status: Due, Education has been provided regarding the importance of this vaccine. Advised may receive this vaccine at local pharmacy or Health Dept. Aware to provide a copy of the vaccination record if obtained from local pharmacy or Health Dept. Verbalized acceptance and understanding.  Pneumococcal vaccine status: Up to date  Covid-19 vaccine status: Information provided on how to obtain vaccines.   Qualifies for Shingles Vaccine? Yes   Zostavax completed No   Shingrix Completed?: Yes  Screening Tests Health Maintenance  Topic Date Due   Medicare Annual Wellness (AWV)  12/10/2022   COVID-19 Vaccine (5 - 2023-24 season) 01/08/2023   INFLUENZA VACCINE  08/07/2023 (Originally 12/08/2022)   Diabetic kidney evaluation - Urine ACR  12/08/2023 (Originally 08/03/2018)   Diabetic kidney evaluation - eGFR measurement  04/27/2023   HEMOGLOBIN A1C  07/07/2023   FOOT EXAM  10/06/2023   OPHTHALMOLOGY EXAM  12/21/2023   DTaP/Tdap/Td (2 - Tdap) 02/25/2029  Pneumonia Vaccine 21+ Years old  Completed   DEXA SCAN  Completed   Zoster Vaccines- Shingrix  Completed   HPV VACCINES  Aged Out   Hepatitis C Screening  Discontinued    Health Maintenance  Health Maintenance Due  Topic Date Due   Medicare Annual Wellness (AWV)  12/10/2022   COVID-19 Vaccine (5 - 2023-24 season) 01/08/2023    Colorectal cancer screening: No longer required.   Mammogram status: No longer required due to age.  Bone Density status: Completed 2. Results reflect: Bone density results: OSTEOPOROSIS. Repeat every 2 years. Will discuss with PCP at next visit  Lung Cancer Screening: (Low Dose CT Chest recommended if Age 33-80 years, 20 pack-year currently smoking OR have quit w/in 15years.) does not qualify.      Additional Screening:  Hepatitis C Screening: does not qualify; Completed NA age  Vision Screening: Recommended annual ophthalmology exams for early detection of glaucoma and other disorders of the eye. Is the patient up to date with their annual eye exam?  Yes  Who is the provider or what is the name of the office in which the patient attends annual eye exams? [facility] If pt is not established with a provider, would they like to be referred to a provider to establish care? No .   Dental Screening: Recommended annual dental exams for proper oral hygiene  Diabetic Foot Exam: Diabetic Foot Exam: Completed 09/2022  Community Resource Referral / Chronic Care Management: CRR required this visit?  No   CCM required this visit?  No     Plan:     I have personally reviewed and noted the following in the patient's chart:   Medical and social history Use of alcohol, tobacco or illicit drugs  Current medications and supplements including opioid prescriptions. Patient is currently taking opioid prescriptions. Information provided to patient regarding non-opioid alternatives. Patient advised to discuss non-opioid treatment plan with their provider. Functional ability and status Nutritional status Physical activity Advanced directives List of other physicians Hospitalizations, surgeries, and ER visits in previous 12 months Vitals Screenings to include cognitive, depression, and falls Referrals and appointments  In addition, I have reviewed and discussed with patient certain preventive protocols, quality metrics, and best practice recommendations. A written personalized care plan for preventive services as well as general preventive health recommendations were provided to patient.     Sydell Axon, LPN   16/05/958   After Visit Summary: (MyChart) Due to this being a telephonic visit, the after visit summary with patients personalized plan was offered to patient via MyChart    Nurse Notes: Phone note sent to PCP regarding .

## 2023-02-13 NOTE — Telephone Encounter (Signed)
Called patient to perform her AWV. While on the phone patient reported that her blood sugar at that time was 350. Patient stated that her blood sugar has been high at times. Patient stated the last time that she ate was at 3:00 am. Patient stated that she had popcorn and a pepsi. Patient stated that she feels fine and does not have any symptoms. Patient stated all she has had today is water since the popcorn during the night. Patient stated that she has not taken her insulin and usually does not take it until about 2:30-3:00 pm. Patient stated that she sees a specialist Percell Boston NP endo for her diabetes. Patient was advised that she should call her specialist and let her know what her blood sugar has been running and she verbalized understanding.

## 2023-02-13 NOTE — Telephone Encounter (Signed)
Left message to call the office back regarding if she has contacted Endo about her high sugar readings?

## 2023-02-13 NOTE — Telephone Encounter (Signed)
Noted.  Agree with advice given to contact endocrinology.  Please call the patient and make sure she reached out to her endocrinologist.  Thanks.

## 2023-02-13 NOTE — Patient Instructions (Signed)
Kelly [name] , Thank you for taking time to come for your Medicare Wellness Visit. I appreciate your ongoing commitment to your health goals. Please review the following plan we discussed and let me know if I can assist you in the future.   Referrals/Orders/Follow-Ups/Clinician Recommendations: Discussed updating Covid vaccineManaging Pain Without Opioids Opioids are strong medicines used to treat moderate to severe pain. For some people, especially those who have long-term (chronic) pain, opioids may not be the best choice for pain management due to: Side effects like nausea, constipation, and sleepiness. The risk of addiction (opioid use disorder). The longer you take opioids, the greater your risk of addiction. Pain that lasts for more than 3 months is called chronic pain. Managing chronic pain usually requires more than one approach and is often provided by a team of health care providers working together (multidisciplinary approach). Pain management may be done at a pain management center or pain clinic. How to manage pain without the use of opioids Use non-opioid medicines Non-opioid medicines for pain may include: Over-the-counter or prescription non-steroidal anti-inflammatory drugs (NSAIDs). These may be the first medicines used for pain. They work well for muscle and bone pain, and they reduce swelling. Acetaminophen. This over-the-counter medicine may work well for milder pain but not swelling. Antidepressants. These may be used to treat chronic pain. A certain type of antidepressant (tricyclics) is often used. These medicines are given in lower doses for pain than when used for depression. Anticonvulsants. These are usually used to treat seizures but may also reduce nerve (neuropathic) pain. Muscle relaxants. These relieve pain caused by sudden muscle tightening (spasms). You may also use a pain medicine that is applied to the skin as a patch, cream, or gel (topical analgesic), such as a  numbing medicine. These may cause fewer side effects than medicines taken by mouth. Do certain therapies as directed Some therapies can help with pain management. They include: Physical therapy. You will do exercises to gain strength and flexibility. A physical therapist may teach you exercises to move and stretch parts of your body that are weak, stiff, or painful. You can learn these exercises at physical therapy visits and practice them at home. Physical therapy may also involve: Massage. Heat wraps or applying heat or cold to affected areas. Electrical signals that interrupt pain signals (transcutaneous electrical nerve stimulation, TENS). Weak lasers that reduce pain and swelling (low-level laser therapy). Signals from your body that help you learn to regulate pain (biofeedback). Occupational therapy. This helps you to learn ways to function at home and work with less pain. Recreational therapy. This involves trying new activities or hobbies, such as a physical activity or drawing. Mental health therapy, including: Cognitive behavioral therapy (CBT). This helps you learn coping skills for dealing with pain. Acceptance and commitment therapy (ACT) to change the way you think and react to pain. Relaxation therapies, including muscle relaxation exercises and mindfulness-based stress reduction. Pain management counseling. This may be individual, family, or group counseling.  Receive medical treatments Medical treatments for pain management include: Nerve block injections. These may include a pain blocker and anti-inflammatory medicines. You may have injections: Near the spine to relieve chronic back or neck pain. Into joints to relieve back or joint pain. Into nerve areas that supply a painful area to relieve body pain. Into muscles (trigger point injections) to relieve some painful muscle conditions. A medical device placed near your spine to help block pain signals and relieve nerve pain  or chronic back pain (  spinal cord stimulation device). Acupuncture. Follow these instructions at home Medicines Take over-the-counter and prescription medicines only as told by your health care provider. If you are taking pain medicine, ask your health care providers about possible side effects to watch out for. Do not drive or use heavy machinery while taking prescription opioid pain medicine. Lifestyle  Do not use drugs or alcohol to reduce pain. If you drink alcohol, limit how much you have to: 0-1 drink a day for women who are not pregnant. 0-2 drinks a day for men. Know how much alcohol is in a drink. In the [place] one drink equals one 12 oz bottle of beer (355 mL), one 5 oz glass of wine (148 mL), or one 1 oz glass of hard liquor (44 mL). Do not use any products that contain nicotine or tobacco. These products include cigarettes, chewing tobacco, and vaping devices, such as e-cigarettes. If you need help quitting, ask your health care provider. Eat a healthy diet and maintain a healthy weight. Poor diet and excess weight may make pain worse. Eat foods that are high in fiber. These include fresh fruits and vegetables, whole grains, and beans. Limit foods that are high in fat and processed sugars, such as fried and sweet foods. Exercise regularly. Exercise lowers stress and may help relieve pain. Ask your health care provider what activities and exercises are safe for you. If your health care provider approves, join an exercise class that combines movement and stress reduction. Examples include yoga and tai chi. Get enough sleep. Lack of sleep may make pain worse. Lower stress as much as possible. Practice stress reduction techniques as told by your therapist. General instructions Work with all your pain management providers to find the treatments that work best for you. You are an important member of your pain management team. There are many things you can do to reduce pain on your  own. Consider joining an online or in-person support group for people who have chronic pain. Keep all follow-up visits. This is important. Where to find more information You can find more information about managing pain without opioids from: American Academy of Pain Medicine: painmed.org Institute for Chronic Pain: instituteforchronicpain.org American Chronic Pain Association: theacpa.org Contact a health care provider if: You have side effects from pain medicine. Your pain gets worse or does not get better with treatments or home therapy. You are struggling with anxiety or depression. Summary Many types of pain can be managed without opioids. Chronic pain may respond better to pain management without opioids. Pain is best managed when you and a team of health care providers work together. Pain management without opioids may include non-opioid medicines, medical treatments, physical therapy, mental health therapy, and lifestyle changes. Tell your health care providers if your pain gets worse or is not being managed well enough. This information is not intended to replace advice given to you by your health care provider. Make sure you discuss any questions you have with your health care provider. Document Revised: 08/05/2020 Document Reviewed: 08/05/2020 Elsevier Patient Education  2024 Elsevier Inc.   This is a list of the screening recommended for you and due dates:  Health Maintenance  Topic Date Due   COVID-19 Vaccine (5 - 2023-24 season) 01/08/2023   Flu Shot  08/07/2023*   Yearly kidney health urinalysis for diabetes  12/08/2023*   Yearly kidney function blood test for diabetes  04/27/2023   Hemoglobin A1C  07/07/2023   Complete foot exam   10/06/2023  Eye exam for diabetics  12/21/2023   Medicare Annual Wellness Visit  02/13/2024   DTaP/Tdap/Td vaccine (2 - Tdap) 02/25/2029   Pneumonia Vaccine  Completed   DEXA scan (bone density measurement)  Completed   Zoster (Shingles)  Vaccine  Completed   HPV Vaccine  Aged Out   Hepatitis C Screening  Discontinued  *Topic was postponed. The date shown is not the original due date.    Advanced directives: (Copy Requested) Please bring a copy of your health care power of attorney and living will to the office to be added to your chart at your convenience.  Next Medicare Annual Wellness Visit scheduled for next year: Yes 02/21/24 @ 12:45

## 2023-02-14 NOTE — Telephone Encounter (Signed)
Patient states it was like that because she ate a candy bar, popcorn and a pepsi. I advised that she contact Endocrinology if her sugar readings are high or low. Patient understands and is agreeable.

## 2023-02-14 NOTE — Telephone Encounter (Signed)
Noted  

## 2023-03-08 ENCOUNTER — Ambulatory Visit
Admission: EM | Admit: 2023-03-08 | Discharge: 2023-03-08 | Disposition: A | Discharge status: Home / Self Care | Payer: Medicare Other | Attending: Emergency Medicine | Admitting: Emergency Medicine

## 2023-03-08 DIAGNOSIS — R350 Frequency of micturition: Secondary | ICD-10-CM | POA: Diagnosis present

## 2023-03-08 LAB — POCT URINALYSIS DIP (MANUAL ENTRY)
Glucose, UA: 500 mg/dL — AB
Nitrite, UA: NEGATIVE
Protein Ur, POC: >=300 mg/dL — AB
Spec Grav, UA: >=1.03 — AB (ref 1.010–1.025)
Urobilinogen, UA: 0.2 U/dL
pH, UA: 5.5 (ref 5.0–8.0)

## 2023-03-08 MED ORDER — CEPHALEXIN 500 MG PO CAPS: 500.0000 mg | ORAL_CAPSULE | Freq: Two times a day (BID) | ORAL | 0 refills | Status: AC

## 2023-03-08 NOTE — Discharge Instructions (Addendum)
Your urinalysis shows Silveria Botz blood cells and nitrates which are indicative of infection, your urine will be sent to the lab to determine exactly which bacteria is present, if any changes need to be made to your medications you will be notified  Begin use of Keflex and every morning and every evening for 5 days  You may use over-the-counter Pyridium (Azo or Cystex) to help minimize your symptoms until antibiotic removes bacteria, this medication will turn your urine orange  Increase your fluid intake through use of water  As always practice good hygiene, wiping front to back and avoidance of scented vaginal products to prevent further irritation  If symptoms continue to persist after use of medication or recur please follow-up with urgent care or your primary doctor as needed

## 2023-03-08 NOTE — ED Provider Notes (Signed)
[name]    CSN: [id number] Arrival date & time: 03/08/23  1509      History   Chief Complaint Chief Complaint  Patient presents with   Urinary Frequency    HPI Kelly [name] is a 82 y.o. female.   Patient presents for evaluation of urinary frequency, dysuria and fatigue present for 2 days.  Has not attempted treatment.  Denies hematuria, abdominal pain or pressure, flank pain, fever, vaginal symptoms.  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     Patient Active Problem List   Diagnosis Date Noted   Acute cystitis with hematuria 05/27/2022   Acute kidney injury superimposed on chronic kidney disease (HCC) 05/26/2022   Gastroparesis 05/26/2022   Memory difficulty 05/12/2022   Arthralgia 11/08/2021   Skin tear of lower leg without complication, initial encounter 10/01/2021   Thrombocytopenia (HCC) 07/02/2021   Trochanteric bursitis, right hip 12/08/2020   Foraminal stenosis of lumbosacral region 12/08/2020   Lumbar herniated disc 12/08/2020   Lumbar radiculopathy 12/02/2020   Rash 09/29/2020   Onychomycosis 08/11/2020   Allergic rhinitis 08/11/2020   Hyperlipidemia 11/15/2019   Low back pain 11/15/2019   Tick bite 10/01/2019   Type 2 diabetes mellitus (HCC) 03/18/2019   Hypertension 03/18/2019   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   Cervico-occipital neuralgia 07/23/2018   Osteoporosis 07/23/2018   Lesion of rectum 09/07/2017   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   Cervical spondylosis without myelopathy 08/15/2017   DDD (degenerative disc disease), cervical 08/15/2017   Bursitis of hip 08/15/2017   Female stress incontinence 08/15/2017    Spondylolisthesis, congenital 08/15/2017   Radial styloid tenosynovitis 08/15/2017   Senile osteoporosis 08/15/2017   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   Rectal hemorrhage 07/25/2017   Essential tremor 04/26/2017   Left ventricular hypertrophy 10/12/2016   Chronic kidney disease, stage 4 (severe) (HCC) 08/26/2016   OSA on CPAP 08/26/2016    Past Surgical History:  Procedure Laterality Date   APPENDECTOMY     BREAST BIOPSY Right 06/19/2018   affirm stereo/x clip/COLUMNAR CELL CHANGE WITH MICROCALCIFICATIONS. FIBROCYSTIC CHANGES WITH MICROCALCIFICATIONS   cataract  03/2017   CHOLECYSTECTOMY     COLONOSCOPY     COLONOSCOPY WITH PROPOFOL N/A 08/22/2017   Procedure: COLONOSCOPY WITH PROPOFOL;  Surgeon: Wyline Mood, MD;  Location: [facility] ENDOSCOPY;  Service: Gastroenterology;  Laterality: N/A;   ESOPHAGOGASTRODUODENOSCOPY     ESOPHAGOGASTRODUODENOSCOPY (EGD) WITH PROPOFOL N/A 03/23/2016   Procedure: ESOPHAGOGASTRODUODENOSCOPY (EGD) WITH PROPOFOL;  Surgeon: Scot Jun, MD;  Location[facility] ENDOSCOPY;  Service: Endoscopy;  Laterality: N/A;   FLEXIBLE SIGMOIDOSCOPY N/A 02/06/2018   Procedure: FLEXIBLE SIGMOIDOSCOPY;  Surgeon: Wyline Mood, MD;  Location: [facility] ENDOSCOPY;  Service: Gastroenterology;  Laterality: N/A;   FLEXIBLE SIGMOIDOSCOPY N/A 12/03/2018   Procedure: FLEXIBLE SIGMOIDOSCOPY;  Surgeon: Wyline Mood, MD;  Location: [facility] ENDOSCOPY;  Service: Gastroenterology;  Laterality: N/A;   RECTAL PROLAPSE REPAIR  2012   x2   skin cancer removed from hand  2024    OB History   No obstetric history on file.      Home Medications    Prior to Admission medications  Medication Sig Start Date End Date Taking? Authorizing Provider  amLODipine (NORVASC) 10 MG tablet Take 1 tablet (10 mg total) by mouth daily. 08/24/22  Yes Glori Luis, MD  aspirin EC 81 MG tablet Take 81 mg by mouth daily.   Yes [provider]  calcitRIOL (ROCALTROL) 0.25 MCG capsule  Take 0.25 mcg by mouth daily.   Yes [provider]  Calcium Citrate-Vitamin D 315-5 MG-MCG TABS Take 1 tablet by mouth daily.   Yes [provider]  cephALEXin (KEFLEX) 500 MG capsule Take 1 capsule (500 mg total) by mouth 2 (two) times daily for 5 days. 03/08/23 03/13/23 Yes Kholton Coate R, NP  Cholecalciferol (VITAMIN D) 50 MCG (2000 UT) CAPS Take 2,000 Units by mouth daily.    Yes [provider]  Continuous Glucose Sensor (FREESTYLE LIBRE 2 SENSOR) MISC Use to check glucose 4 times daily 01/10/23  Yes Reardon, Freddi Starr, NP  cyclobenzaprine (FLEXERIL) 10 MG tablet TAKE 1/2 TABLET (5MG  TOTAL)3 TIMES A DAY AS NEEDED    FOR MUSCLE SPASMS 01/28/20  Yes Glori Luis, MD  glucosamine-chondroitin 500-400 MG tablet Take 1 tablet by mouth 3 (three) times daily.   Yes [provider]  HYDROcodone-acetaminophen (NORCO/VICODIN) 5-325 MG tablet Take 1 tablet by mouth every 4 (four) hours as needed.   Yes [provider]  Insulin Glargine (BASAGLAR KWIKPEN) 100 UNIT/ML Inject 10 Units into the skin at bedtime. 01/10/23  Yes Reardon, Freddi Starr, NP  Insulin Pen Needle (BD PEN NEEDLE NANO 2ND GEN) 32G X 4 MM MISC Use to monitor glucose twice daily 01/10/23  Yes Dani Gobble, NP  Lancets MISC Use up to 4 times daily to check blood sugars. 11/04/16  Yes Cook, [name], DO  losartan (COZAAR) 50 MG tablet Take 1 tablet (50 mg total) by mouth daily. 11/18/22  Yes Glori Luis, MD  Melatonin 5 MG CHEW Chew 5 mg by mouth at bedtime as needed.   Yes [provider]  rosuvastatin (CRESTOR) 10 MG tablet Take 1 tablet (10 mg total) by mouth daily. 08/24/22  Yes Glori Luis, MD  sertraline (ZOLOFT) 50 MG tablet Take 1 tablet (50 mg total) by mouth daily. 08/03/22  Yes Glori Luis, MD  sertraline (ZOLOFT) 50 MG tablet Take 1 tablet (50 mg total) by mouth daily. 08/04/22  Yes Glori Luis, MD  diclofenac (FLECTOR) 1.3 % PTCH 1 patch 2 (two) times  daily. 04/13/22   [provider]  furosemide (LASIX) 40 MG tablet Take 40 mg by mouth daily as needed.    [provider]  NARCAN 4 MG/0.1ML LIQD nasal spray kit  07/13/22   [provider]  ondansetron (ZOFRAN-ODT) 4 MG disintegrating tablet Take by mouth. 05/28/22   [provider]  potassium chloride (K-DUR) 10 MEQ tablet Take 10 mEq by mouth daily as needed.    [provider]    Family History Family History  Problem Relation Age of Onset   Stroke Mother    Arthritis Mother    Aortic aneurysm Sister    Lung cancer Sister    Heart attack Father 34   Breast cancer Sister 45    Social History Social History   Tobacco Use   Smoking status: Never   Smokeless tobacco: Never  Vaping Use   Vaping status: Never Used  Substance Use Topics   Alcohol use: No   Drug use: No     Allergies   Prilosec  otc [omeprazole magnesium], Sucralfate, Amoxicillin, and Morphine and codeine   Review of Systems Review of Systems  Genitourinary:  Positive for frequency.     Physical Exam Triage Vital Signs ED Triage Vitals [03/08/23 1600]  Encounter Vitals Group     BP (!) 159/70     Systolic BP Percentile      Diastolic BP Percentile      Pulse Rate 63     Resp 16     Temp 98.7 F (37.1 C)     Temp Source Oral     SpO2 95 %     Weight      Height      Head Circumference      Peak Flow      Pain Score 0     Pain Loc      Pain Education      Exclude from Growth Chart    No data found.  Updated Vital Signs BP (!) 159/70 (BP Location: Right Arm)   Pulse 63   Temp 98.7 F (37.1 C) (Oral)   Resp 16   SpO2 95%   Visual Acuity Right Eye Distance:   Left Eye Distance:   Bilateral Distance:    Right Eye Near:   Left Eye Near:    Bilateral Near:     Physical Exam Constitutional:      Appearance: Normal appearance.  Eyes:     Extraocular Movements: Extraocular movements intact.  Pulmonary:     Effort: Pulmonary effort is  normal.  Genitourinary:    Comments: deferred Neurological:     Mental Status: She is alert and oriented to person, place, and time. Mental status is at baseline.      UC Treatments / Results  Labs (all labs ordered are listed, but only abnormal results are displayed) Labs Reviewed  POCT URINALYSIS DIP (MANUAL ENTRY) - Abnormal; Notable for the following components:      Result Value   Clarity, UA cloudy (*)    Glucose, UA =500 (*)    Bilirubin, UA small (*)    Ketones, POC UA trace (5) (*)    Spec Grav, UA >=1.030 (*)    Blood, UA large (*)    Protein Ur, POC >=300 (*)    Leukocytes, UA Small (1+) (*)    All other components within normal limits  URINE CULTURE    EKG   Radiology No results found.  Procedures Procedures (including critical care time)  Medications Ordered in UC Medications - No data to display  Initial Impression / Assessment and Plan / UC Course  I have reviewed the triage vital signs and the nursing notes.  Pertinent labs & imaging results that were available during my care of the patient were reviewed by me and considered in my medical decision making (see chart for details).  Urinary frequency  Urinalysis showing leukocytes and blood, negative for nitrates, sent for culture, prophylactically treating as she is symptomatic, cephalexin sent to pharmacy recommended additional supportive measures and advised follow-up if symptoms continue to persist or worsen or recur Final Clinical Impressions(s) / UC Diagnoses   Final diagnoses:  Urinary frequency     Discharge Instructions      Your urinalysis shows Kelly [name] blood cells and nitrates which are indicative of infection, your urine will be sent to the lab to determine exactly which bacteria is present, if any changes need to be made to your medications you will be notified  Begin use of Keflex and  every morning and every evening for 5 days  You may use over-the-counter Pyridium (Azo or Cystex)  to help minimize your symptoms until antibiotic removes bacteria, this medication will turn your urine orange  Increase your fluid intake through use of water  As always practice good hygiene, wiping front to back and avoidance of scented vaginal products to prevent further irritation  If symptoms continue to persist after use of medication or recur please follow-up with urgent care or your primary doctor as needed    ED Prescriptions     Medication Sig Dispense Auth. Provider   cephALEXin (KEFLEX) 500 MG capsule Take 1 capsule (500 mg total) by mouth 2 (two) times daily for 5 days. 10 capsule Valinda Hoar, NP      PDMP not reviewed this encounter.   Valinda Hoar, NP 03/08/23 [phone]

## 2023-03-08 NOTE — ED Triage Notes (Signed)
Urinary frequency, burning with urination, fatigue x 2 days.

## 2023-03-11 LAB — URINE CULTURE: Culture: >=100000 — AB

## 2023-03-28 ENCOUNTER — Telehealth: Payer: Self-pay

## 2023-03-28 MED ORDER — DENOSUMAB 60 MG/ML ~~LOC~~ SOSY
60.0000 mg | PREFILLED_SYRINGE | Freq: Once | SUBCUTANEOUS | Status: AC
  Administered 2023-04-10: 60 mg via SUBCUTANEOUS

## 2023-03-28 NOTE — Addendum Note (Signed)
Addended by: Warden Fillers on: 03/28/2023 01:02 PM   Modules accepted: Orders

## 2023-03-28 NOTE — Telephone Encounter (Addendum)
Pharmacy Patient Advocate Encounter   Received notification from  [facility] Portal that prior authorization for PROLIA is required/requested.   Insurance verification completed.   The patient is insured through [employer] .   Per test claim: PA required; PA submitted to above mentioned insurance via CoverMyMeds Key/confirmation #/EOC [id number] Status is pending

## 2023-04-04 ENCOUNTER — Other Ambulatory Visit (HOSPITAL_COMMUNITY): Payer: Self-pay

## 2023-04-04 DIAGNOSIS — N2581 Secondary hyperparathyroidism of renal origin: Secondary | ICD-10-CM | POA: Diagnosis not present

## 2023-04-04 DIAGNOSIS — E1122 Type 2 diabetes mellitus with diabetic chronic kidney disease: Secondary | ICD-10-CM | POA: Diagnosis not present

## 2023-04-04 DIAGNOSIS — I10 Essential (primary) hypertension: Secondary | ICD-10-CM | POA: Diagnosis not present

## 2023-04-04 DIAGNOSIS — N184 Chronic kidney disease, stage 4 (severe): Secondary | ICD-10-CM | POA: Diagnosis not present

## 2023-04-04 NOTE — Telephone Encounter (Signed)
Pharmacy Patient Advocate Encounter  Received notification from  [facility]  that Prior Authorization for PROLIA has been APPROVED from 10.26.24 to 11.25.25   PA #/Case ID/Reference #: [id number] Galea

## 2023-04-04 NOTE — Telephone Encounter (Signed)
Pharmacy Patient Advocate Encounter  Received notification from [facility] that Prior Authorization for prolia has been APPROVED from 03/04/23 to 04/02/24. Ran test claim, Copay is $240. This test claim was processed through [facility]- copay amounts may vary at other pharmacies due to pharmacy/plan contracts, or as the patient moves through the different stages of their insurance plan.

## 2023-04-10 ENCOUNTER — Ambulatory Visit: Payer: Medicare Other | Admitting: Family Medicine

## 2023-04-10 ENCOUNTER — Ambulatory Visit: Payer: Medicare Other

## 2023-04-10 ENCOUNTER — Other Ambulatory Visit (HOSPITAL_COMMUNITY): Payer: Self-pay

## 2023-04-10 DIAGNOSIS — M810 Age-related osteoporosis without current pathological fracture: Secondary | ICD-10-CM | POA: Diagnosis not present

## 2023-04-10 MED ORDER — DENOSUMAB 60 MG/ML ~~LOC~~ SOSY
60.0000 mg | PREFILLED_SYRINGE | SUBCUTANEOUS | Status: AC
  Administered 2023-10-16: 60 mg via SUBCUTANEOUS

## 2023-04-10 NOTE — Telephone Encounter (Signed)
Pt ready for scheduling for PROLIA on or after : 04/10/23  Out-of-pocket cost due at time of visit: $0  Primary: MEDICARE Prolia co-insurance: 0% Admin fee co-insurance: 0%  Secondary: [place] Prolia co-insurance:  Admin fee co-insurance:   Medical Benefit Details: Date Benefits were checked: 03/28/23 Deductible: $240 MET OF $240 REQUIRED/ Coinsurance: 0%/ Admin Fee: 0%  Prior Auth: APPROVED (BCBS[place]-FEP) PA# [id number] Expiration Date: 03/04/23-04/02/24  # of doses approved: 2  Pharmacy benefit: Copay $240 If patient wants fill through the pharmacy benefit please send prescription to:  --- , and include estimated need by date in rx notes. Pharmacy will ship medication directly to the office.  Patient NOT eligible for Prolia Copay Card. Copay Card can make patient's cost as little as $25. Link to apply: [email or web address]://[email or web address].[email or web address]  ** This summary of benefits is an estimation of the patient's out-of-pocket cost. Exact cost may very based on individual plan coverage.

## 2023-04-10 NOTE — Progress Notes (Signed)
Pt presented for their subcutaneous Prolia injection. Pt was identified through two identifiers. Pt was given the information packets about the Prolia and told to schedule their next injection 6 months out. Pt tolerated the subq injection well in the right arm.  

## 2023-04-11 DIAGNOSIS — M533 Sacrococcygeal disorders, not elsewhere classified: Secondary | ICD-10-CM | POA: Diagnosis not present

## 2023-04-11 DIAGNOSIS — M7062 Trochanteric bursitis, left hip: Secondary | ICD-10-CM | POA: Diagnosis not present

## 2023-04-11 DIAGNOSIS — Z79899 Other long term (current) drug therapy: Secondary | ICD-10-CM | POA: Diagnosis not present

## 2023-04-11 DIAGNOSIS — M461 Sacroiliitis, not elsewhere classified: Secondary | ICD-10-CM | POA: Diagnosis not present

## 2023-04-11 DIAGNOSIS — M546 Pain in thoracic spine: Secondary | ICD-10-CM | POA: Diagnosis not present

## 2023-04-11 DIAGNOSIS — M6281 Muscle weakness (generalized): Secondary | ICD-10-CM | POA: Diagnosis not present

## 2023-04-11 DIAGNOSIS — T2101XA Burn of unspecified degree of chest wall, initial encounter: Secondary | ICD-10-CM | POA: Diagnosis not present

## 2023-04-11 DIAGNOSIS — Q762 Congenital spondylolisthesis: Secondary | ICD-10-CM | POA: Diagnosis not present

## 2023-04-11 DIAGNOSIS — M5032 Other cervical disc degeneration, mid-cervical region, unspecified level: Secondary | ICD-10-CM | POA: Diagnosis not present

## 2023-04-11 DIAGNOSIS — E1165 Type 2 diabetes mellitus with hyperglycemia: Secondary | ICD-10-CM | POA: Diagnosis not present

## 2023-04-11 DIAGNOSIS — M25572 Pain in left ankle and joints of left foot: Secondary | ICD-10-CM | POA: Diagnosis not present

## 2023-04-11 DIAGNOSIS — M47812 Spondylosis without myelopathy or radiculopathy, cervical region: Secondary | ICD-10-CM | POA: Diagnosis not present

## 2023-04-12 ENCOUNTER — Ambulatory Visit: Payer: Medicare Other

## 2023-04-12 DIAGNOSIS — N1832 Chronic kidney disease, stage 3b: Secondary | ICD-10-CM | POA: Diagnosis not present

## 2023-04-12 DIAGNOSIS — I10 Essential (primary) hypertension: Secondary | ICD-10-CM | POA: Diagnosis not present

## 2023-04-12 DIAGNOSIS — R600 Localized edema: Secondary | ICD-10-CM | POA: Diagnosis not present

## 2023-04-12 DIAGNOSIS — N2581 Secondary hyperparathyroidism of renal origin: Secondary | ICD-10-CM | POA: Diagnosis not present

## 2023-04-12 DIAGNOSIS — E1122 Type 2 diabetes mellitus with diabetic chronic kidney disease: Secondary | ICD-10-CM | POA: Diagnosis not present

## 2023-04-21 ENCOUNTER — Ambulatory Visit (INDEPENDENT_AMBULATORY_CARE_PROVIDER_SITE_OTHER): Payer: Medicare Other | Admitting: Family Medicine

## 2023-04-21 ENCOUNTER — Encounter: Payer: Self-pay | Admitting: Family Medicine

## 2023-04-21 VITALS — BP 128/78 | HR 68 | Temp 98.2°F | Ht 63.0 in | Wt 136.2 lb

## 2023-04-21 DIAGNOSIS — E1122 Type 2 diabetes mellitus with diabetic chronic kidney disease: Secondary | ICD-10-CM | POA: Diagnosis not present

## 2023-04-21 DIAGNOSIS — M810 Age-related osteoporosis without current pathological fracture: Secondary | ICD-10-CM

## 2023-04-21 DIAGNOSIS — I10 Essential (primary) hypertension: Secondary | ICD-10-CM | POA: Diagnosis not present

## 2023-04-21 DIAGNOSIS — Z794 Long term (current) use of insulin: Secondary | ICD-10-CM

## 2023-04-21 DIAGNOSIS — E785 Hyperlipidemia, unspecified: Secondary | ICD-10-CM

## 2023-04-21 DIAGNOSIS — N184 Chronic kidney disease, stage 4 (severe): Secondary | ICD-10-CM

## 2023-04-21 LAB — COMPREHENSIVE METABOLIC PANEL
ALT: 25 U/L (ref 0–35)
AST: 23 U/L (ref 0–37)
Albumin: 3.8 g/dL (ref 3.5–5.2)
Alkaline Phosphatase: 96 U/L (ref 39–117)
BUN: 25 mg/dL — ABNORMAL HIGH (ref 6–23)
CO2: 27 meq/L (ref 19–32)
Calcium: 9.5 mg/dL (ref 8.4–10.5)
Chloride: 106 meq/L (ref 96–112)
Creatinine, Ser: 1.88 mg/dL — ABNORMAL HIGH (ref 0.40–1.20)
GFR: 24.7 mL/min — ABNORMAL LOW (ref >60.00)
Glucose, Bld: 296 mg/dL — ABNORMAL HIGH (ref 70–99)
Potassium: 4.8 meq/L (ref 3.5–5.1)
Sodium: 140 meq/L (ref 135–145)
Total Bilirubin: 0.7 mg/dL (ref 0.2–1.2)
Total Protein: 6.5 g/dL (ref 6.0–8.3)

## 2023-04-21 LAB — MICROALBUMIN / CREATININE URINE RATIO
Creatinine,U: 205.5 mg/dL
Microalb Creat Ratio: 47.3 mg/g — ABNORMAL HIGH (ref 0.0–30.0)
Microalb, Ur: 97.2 mg/dL — ABNORMAL HIGH (ref 0.0–1.9)

## 2023-04-21 LAB — LDL CHOLESTEROL, DIRECT: Direct LDL: 93 mg/dL

## 2023-04-21 LAB — HEMOGLOBIN A1C: Hgb A1c MFr Bld: 9.6 % — ABNORMAL HIGH (ref 4.6–6.5)

## 2023-04-21 NOTE — Patient Instructions (Signed)
Nice to see you. Please call [phone] to schedule your bone density scan. 

## 2023-04-21 NOTE — Assessment & Plan Note (Signed)
Chronic issue.  Continue Crestor 10 mg daily.

## 2023-04-21 NOTE — Assessment & Plan Note (Signed)
Chronic issue.  Check A1c.  Continue Basaglar 10 units daily.  She will work on reducing sugar intake.  She will continue to see endocrinology.

## 2023-04-21 NOTE — Assessment & Plan Note (Signed)
Chronic issue.  She will continue Prolia injections every 6 months.  Check DEXA scan.  Patient will call to schedule this.

## 2023-04-21 NOTE — Assessment & Plan Note (Addendum)
Chronic issue.  Adequately controlled for her age.  She will continue amlodipine 10 mg daily.  Discussed that the occasional swelling she has is likely related to the amlodipine.  If it worsens or becomes bothersome she will let us know.

## 2023-04-21 NOTE — Progress Notes (Signed)
[name], MD Phone: [phone]  Kelly [name] is a 82 y.o. female who presents today for f/u.  HYPERTENSION Disease Monitoring Home BP Monitoring 136 systolic Chest pain- no    Dyspnea- no Medications Compliance-  taking amlodipine  Edema- note not frequent BMET    Component Value Date/Time   NA 142 04/26/2022 1823   NA 144 07/05/2018 0000   NA 137 01/18/2013 1036   K 3.7 04/26/2022 1823   K 3.9 01/18/2013 1036   CL 105 04/26/2022 1823   CL 103 01/18/2013 1036   CO2 29 04/26/2022 1823   CO2 26 01/18/2013 1036   GLUCOSE 89 04/26/2022 1823   GLUCOSE 342 (H) 01/18/2013 1036   BUN 26 (H) 04/26/2022 1823   BUN 35 (A) 07/05/2018 0000   BUN 14 01/18/2013 1036   CREATININE 1.77 (H) 04/26/2022 1823   CREATININE 1.48 (H) 01/18/2013 1036   CALCIUM 9.3 04/26/2022 1823   CALCIUM 9.3 01/18/2013 1036   GFRNONAA 29 (L) 04/26/2022 1823   GFRNONAA 35 (L) 01/18/2013 1036   GFRAA 19 (L) 10/20/2019 1735   GFRAA 41 (L) 01/18/2013 1036   DIABETES Disease Monitoring: Blood Sugar ranges-250+      Optho- UTD Medications: Compliance- taking basaglar 10 u daily    HYPERLIPIDEMIA Symptoms Chest pain on exertion:  no  Medications: Compliance- taking crestor Right upper quadrant pain- no  Muscle aches- no Lipid Panel     Component Value Date/Time   CHOL 138 07/07/2022 0751   TRIG 109.0 07/07/2022 0751   HDL 42.20 07/07/2022 0751   CHOLHDL 3 07/07/2022 0751   VLDL 21.8 07/07/2022 0751   LDLCALC 74 07/07/2022 0751   LDLDIRECT 78.0 02/18/2019 0857      Social History   Tobacco Use  Smoking Status Never  Smokeless Tobacco Never    Current Outpatient Medications on File Prior to Visit  Medication Sig Dispense Refill   amLODipine (NORVASC) 10 MG tablet Take 1 tablet (10 mg total) by mouth daily. 90 tablet 3   aspirin EC 81 MG tablet Take 81 mg by mouth daily.     calcitRIOL (ROCALTROL) 0.25 MCG capsule Take 0.25 mcg by mouth daily.     Calcium Citrate-Vitamin D  315-5 MG-MCG TABS Take 1 tablet by mouth daily.     Cholecalciferol (VITAMIN D) 50 MCG (2000 UT) CAPS Take 2,000 Units by mouth daily.      Continuous Glucose Sensor (FREESTYLE LIBRE 2 SENSOR) MISC Use to check glucose 4 times daily 6 each 3   cyclobenzaprine (FLEXERIL) 10 MG tablet TAKE 1/2 TABLET (5MG  TOTAL)3 TIMES A DAY AS NEEDED    FOR MUSCLE SPASMS 45 tablet 0   diclofenac (FLECTOR) 1.3 % PTCH 1 patch 2 (two) times daily.     furosemide (LASIX) 40 MG tablet Take 40 mg by mouth daily as needed.     glucosamine-chondroitin 500-400 MG tablet Take 1 tablet by mouth 3 (three) times daily.     HYDROcodone-acetaminophen (NORCO/VICODIN) 5-325 MG tablet Take 1 tablet by mouth every 4 (four) hours as needed.     Insulin Glargine (BASAGLAR KWIKPEN) 100 UNIT/ML Inject 10 Units into the skin at bedtime. 12 mL 3   Insulin Pen Needle (BD PEN NEEDLE NANO 2ND GEN) 32G X 4 MM MISC Use to monitor glucose twice daily 100 each 6   Lancets MISC Use up to 4 times daily to check blood sugars. 200 each 11   losartan (COZAAR) 50 MG tablet Take 1 tablet (50 mg  total) by mouth daily. 90 tablet 1   Melatonin 5 MG CHEW Chew 5 mg by mouth at bedtime as needed.     NARCAN 4 MG/0.1ML LIQD nasal spray kit      ondansetron (ZOFRAN-ODT) 4 MG disintegrating tablet Take by mouth.     potassium chloride (K-DUR) 10 MEQ tablet Take 10 mEq by mouth daily as needed.     rosuvastatin (CRESTOR) 10 MG tablet Take 1 tablet (10 mg total) by mouth daily. 90 tablet 3   sertraline (ZOLOFT) 50 MG tablet Take 1 tablet (50 mg total) by mouth daily. 7 tablet 0   sertraline (ZOLOFT) 50 MG tablet Take 1 tablet (50 mg total) by mouth daily. 30 tablet 0   Current Facility-Administered Medications on File Prior to Visit  Medication Dose Route Frequency Provider Last Rate Last Admin   denosumab (PROLIA) injection 60 mg  60 mg Subcutaneous Q6 months Glori Luis, MD   60 mg at 04/05/21 1307   [START ON 10/09/2023] denosumab (PROLIA) injection 60  mg  60 mg Subcutaneous Q6 months Glori Luis, MD         ROS see history of present illness  Objective  Physical Exam Vitals:   04/21/23 1314  BP: 128/78  Pulse: 68  Temp: 98.2 F (36.8 C)  SpO2: 94%    BP Readings from Last 3 Encounters:  04/21/23 128/78  03/08/23 (!) 159/70  01/10/23 137/66   Wt Readings from Last 3 Encounters:  04/21/23 136 lb 3.2 oz (61.8 kg)  02/13/23 133 lb (60.3 kg)  01/10/23 130 lb 9.6 oz (59.2 kg)    Physical Exam Constitutional:      General: She is not in acute distress.    Appearance: She is not diaphoretic.  Cardiovascular:     Rate and Rhythm: Normal rate and regular rhythm.     Heart sounds: Normal heart sounds.  Pulmonary:     Effort: Pulmonary effort is normal.  Musculoskeletal:     Right lower leg: No edema.     Left lower leg: No edema.  Skin:    General: Skin is warm and dry.  Neurological:     Mental Status: She is alert.      Assessment/Plan: Please see individual problem list.  Type 2 diabetes mellitus with stage 4 chronic kidney disease, with long-term current use of insulin (HCC) Assessment & Plan: Chronic issue.  Check A1c.  Continue Basaglar 10 units daily.  She will work on reducing sugar intake.  She will continue to see endocrinology.  Orders: -     Hemoglobin A1c -     Microalbumin / creatinine urine ratio -     Comprehensive metabolic panel  Age-related osteoporosis without current pathological fracture Assessment & Plan: Chronic issue.  She will continue Prolia injections every 6 months.  Check DEXA scan.  Patient will call to schedule this.  Orders: -     DG Bone Density; Future  Primary hypertension Assessment & Plan: Chronic issue.  Adequately controlled for her age.  She will continue amlodipine 10 mg daily.  Discussed that the occasional swelling she has is likely related to the amlodipine.  If it worsens or becomes bothersome she will let us know.   Hyperlipidemia, unspecified  hyperlipidemia type Assessment & Plan: Chronic issue.  Continue Crestor 10 mg daily.  Orders: -     LDL cholesterol, direct     Return for As scheduled.   [name], MD [facility] -  [employer]

## 2023-04-24 ENCOUNTER — Telehealth: Payer: Self-pay

## 2023-04-24 NOTE — Telephone Encounter (Signed)
-----   Message from [name] sent at 04/24/2023  9:28 AM EST ----- Please let the patient know that her A1c remains uncontrolled.  She needs to see endocrinology as planned this week.  Her urine has an increased amount of protein in it.  I would suggest we increase her losartan to 100 mg to help with this.  She would need a follow-up BMP 1 week after increasing her losartan.

## 2023-04-24 NOTE — Telephone Encounter (Signed)
Left message to call the office back regarding her results below.

## 2023-04-25 ENCOUNTER — Other Ambulatory Visit: Payer: Self-pay

## 2023-04-25 DIAGNOSIS — I10 Essential (primary) hypertension: Secondary | ICD-10-CM

## 2023-04-25 NOTE — Telephone Encounter (Signed)
noted 

## 2023-04-25 NOTE — Telephone Encounter (Signed)
Patient called and note from Dr Birdie Sons was read. She stated that her kidney doctor increased her Losartan last week. She said she stated the 100 mg on Friday, 04/21/2023. Lab was scheduled for 04/28/2023 to check her BMP.

## 2023-04-27 ENCOUNTER — Ambulatory Visit (INDEPENDENT_AMBULATORY_CARE_PROVIDER_SITE_OTHER): Payer: Medicare Other | Admitting: Nurse Practitioner

## 2023-04-27 ENCOUNTER — Encounter: Payer: Self-pay | Admitting: Family Medicine

## 2023-04-27 ENCOUNTER — Telehealth: Payer: Self-pay

## 2023-04-27 ENCOUNTER — Encounter: Payer: Self-pay | Admitting: Nurse Practitioner

## 2023-04-27 VITALS — BP 129/67 | HR 67 | Ht 63.0 in | Wt 137.6 lb

## 2023-04-27 DIAGNOSIS — N1832 Chronic kidney disease, stage 3b: Secondary | ICD-10-CM | POA: Diagnosis not present

## 2023-04-27 DIAGNOSIS — N184 Chronic kidney disease, stage 4 (severe): Secondary | ICD-10-CM

## 2023-04-27 DIAGNOSIS — Z794 Long term (current) use of insulin: Secondary | ICD-10-CM

## 2023-04-27 DIAGNOSIS — E1122 Type 2 diabetes mellitus with diabetic chronic kidney disease: Secondary | ICD-10-CM | POA: Diagnosis not present

## 2023-04-27 MED ORDER — BASAGLAR KWIKPEN 100 UNIT/ML ~~LOC~~ SOPN: 14.0000 [IU] | PEN_INJECTOR | Freq: Every day | SUBCUTANEOUS | Status: DC

## 2023-04-27 NOTE — Telephone Encounter (Signed)
Copied from CRM [id number]. Topic: Clinical - Lab/Test Results >> Apr 27, 2023  8:10 AM Irine Seal wrote: Reason for CRM: PT returning a missed call. Requesting a call back, and to please let the phone ring a little longer. She also stated she would like a copy of her lab results that include her A1c results, she will be by the clinic this afternoon to pick them up

## 2023-04-27 NOTE — Telephone Encounter (Signed)
Labs printed.

## 2023-04-27 NOTE — Telephone Encounter (Signed)
Returned call to pt. There was no documentation that a call was placed this morning. Asked pt if she had any questions in regards to her labs pt stated that she didn't. Informed pt that her labs would be up front for pick up

## 2023-04-27 NOTE — Progress Notes (Signed)
Endocrinology Follow Up Note       04/27/2023, 4:27 PM   Subjective:    Patient ID: Kelly [name], female    DOB: 04-02-1941.  Kelly [name] is being seen in follow up after being seen in consultation for management of currently uncontrolled symptomatic diabetes requested by  Glori Luis, MD.   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     Past Surgical History:  Procedure Laterality Date   APPENDECTOMY     BREAST BIOPSY Right 06/19/2018   affirm stereo/x clip/COLUMNAR CELL CHANGE WITH MICROCALCIFICATIONS. FIBROCYSTIC CHANGES WITH MICROCALCIFICATIONS   cataract  03/2017   CHOLECYSTECTOMY     COLONOSCOPY     COLONOSCOPY WITH PROPOFOL N/A 08/22/2017   Procedure: COLONOSCOPY WITH PROPOFOL;  Surgeon: Wyline Mood, MD;  Location: [facility] ENDOSCOPY;  Service: Gastroenterology;  Laterality: N/A;   ESOPHAGOGASTRODUODENOSCOPY     ESOPHAGOGASTRODUODENOSCOPY (EGD) WITH PROPOFOL N/A 03/23/2016   Procedure: ESOPHAGOGASTRODUODENOSCOPY (EGD) WITH PROPOFOL;  Surgeon: Scot Jun, MD;  Location: [facility] ENDOSCOPY;  Service: Endoscopy;  Laterality: N/A;   FLEXIBLE SIGMOIDOSCOPY N/A 02/06/2018   Procedure: FLEXIBLE SIGMOIDOSCOPY;  Surgeon: Wyline Mood, MD;  Location: [facility] ENDOSCOPY;  Service: Gastroenterology;  Laterality: N/A;   FLEXIBLE SIGMOIDOSCOPY N/A 12/03/2018   Procedure: FLEXIBLE SIGMOIDOSCOPY;  Surgeon: Wyline Mood, MD;  Location: [facility] ENDOSCOPY;  Service: Gastroenterology;  Laterality: N/A;   RECTAL PROLAPSE REPAIR  2012   x2   skin cancer removed from hand  2024    Social History   Socioeconomic History   Marital status:  Widowed    Spouse name: Not on file   Number of children: Not on file   Years of education: Not on file   Highest education level: High school graduate  Occupational History   Occupation: retired   Tobacco Use   Smoking status: Never   Smokeless tobacco: Never  Vaping Use   Vaping status: Never Used  Substance and Sexual Activity   Alcohol use: No   Drug use: No   Sexual activity: Not Currently    Partners: Male  Other Topics Concern   Not on file  Social History Narrative   widow   Social Drivers of Health   Financial Resource Strain: Low Risk  (02/13/2023)   Overall Financial Resource Strain (CARDIA)    Difficulty of Paying Living Expenses: Not hard at all  Food Insecurity: No Food Insecurity (02/13/2023)   Hunger Vital Sign    Worried About Running Out of Food in the Last Year: Never true    Ran Out of Food in the Last Year: Never true  Transportation Needs: No Transportation Needs (02/13/2023)   PRAPARE - Administrator, Civil Service (Medical): No    Lack of Transportation (Non-Medical): No  Physical Activity: Sufficiently Active (02/13/2023)   Exercise Vital Sign  Days of Exercise per Week: 2 days    Minutes of Exercise per Session: 120 min  Stress: No Stress Concern Present (02/13/2023)   [employer] of Occupational Health - Occupational Stress Questionnaire    Feeling of Stress : Not at all  Social Connections: Moderately Integrated (02/13/2023)   Social Connection and Isolation Panel [NHANES]    Frequency of Communication with Friends and Family: More than three times a week    Frequency of Social Gatherings with Friends and Family: Three times a week    Attends Religious Services: More than 4 times per year    Active Member of Clubs or Organizations: Yes    Attends Banker Meetings: More than 4 times per year    Marital Status: Widowed    Family History  Problem Relation Age of Onset   Stroke Mother    Arthritis Mother     Aortic aneurysm Sister    Lung cancer Sister    Heart attack Father 34   Breast cancer Sister 23    Outpatient Encounter Medications as of 04/27/2023  Medication Sig   amLODipine (NORVASC) 10 MG tablet Take 1 tablet (10 mg total) by mouth daily.   aspirin EC 81 MG tablet Take 81 mg by mouth daily.   calcitRIOL (ROCALTROL) 0.25 MCG capsule Take 0.25 mcg by mouth daily.   Calcium Citrate-Vitamin D 315-5 MG-MCG TABS Take 1 tablet by mouth daily.   Cholecalciferol (VITAMIN D) 50 MCG (2000 UT) CAPS Take 2,000 Units by mouth daily.    Continuous Glucose Sensor (FREESTYLE LIBRE 2 SENSOR) MISC Use to check glucose 4 times daily   cyclobenzaprine (FLEXERIL) 10 MG tablet TAKE 1/2 TABLET (5MG  TOTAL)3 TIMES A DAY AS NEEDED    FOR MUSCLE SPASMS   diclofenac (FLECTOR) 1.3 % PTCH 1 patch 2 (two) times daily.   furosemide (LASIX) 40 MG tablet Take 40 mg by mouth daily as needed.   glucosamine-chondroitin 500-400 MG tablet Take 1 tablet by mouth 3 (three) times daily.   HYDROcodone-acetaminophen (NORCO/VICODIN) 5-325 MG tablet Take 1 tablet by mouth every 4 (four) hours as needed.   Insulin Pen Needle (BD PEN NEEDLE NANO 2ND GEN) 32G X 4 MM MISC Use to monitor glucose twice daily   Lancets MISC Use up to 4 times daily to check blood sugars.   losartan (COZAAR) 100 MG tablet Take 100 mg by mouth daily.   Melatonin 5 MG CHEW Chew 5 mg by mouth at bedtime as needed.   NARCAN 4 MG/0.1ML LIQD nasal spray kit    ondansetron (ZOFRAN-ODT) 4 MG disintegrating tablet Take by mouth.   potassium chloride (K-DUR) 10 MEQ tablet Take 10 mEq by mouth daily as needed.   rosuvastatin (CRESTOR) 10 MG tablet Take 1 tablet (10 mg total) by mouth daily.   sertraline (ZOLOFT) 50 MG tablet Take 1 tablet (50 mg total) by mouth daily.   sertraline (ZOLOFT) 50 MG tablet Take 1 tablet (50 mg total) by mouth daily.   [DISCONTINUED] Insulin Glargine (BASAGLAR KWIKPEN) 100 UNIT/ML Inject 10 Units into the skin at bedtime.   Insulin  Glargine (BASAGLAR KWIKPEN) 100 UNIT/ML Inject 14 Units into the skin at bedtime.   [DISCONTINUED] losartan (COZAAR) 50 MG tablet Take 1 tablet (50 mg total) by mouth daily. (Patient not taking: Reported on 04/27/2023)   Facility-Administered Encounter Medications as of 04/27/2023  Medication   denosumab (PROLIA) injection 60 mg   [START ON 10/09/2023] denosumab (PROLIA) injection 60 mg  ALLERGIES: Allergies  Allergen Reactions   Prilosec Otc [Omeprazole Magnesium] Other (See Comments)    Maybe cause of acute interstitial nephritis   Sucralfate Other (See Comments)    Maybe cause of acute interstitial nephritis   Amoxicillin Diarrhea   Morphine And Codeine Other (See Comments)    Chest pains Chest pains    VACCINATION STATUS: Immunization History  Administered Date(s) Administered   Fluad Quad(high Dose 65+) 02/25/2019, 03/02/2020, 05/17/2021, 04/08/2022   Influenza, High Dose Seasonal PF 02/24/2017, 02/28/2018   Influenza,inj,quad, With Preservative 02/19/2016   Moderna Sars-Covid-2 Vaccination 07/05/2019, 08/12/2019, 03/02/2020, 08/10/2020   PNEUMOCOCCAL CONJUGATE-20 07/02/2021   Td 02/26/2019   Zoster Recombinant(Shingrix) 01/02/2020, 03/10/2020    Diabetes She presents for her follow-up diabetic visit. She has type 2 diabetes mellitus. Onset time: diagnosed at approx age of 22. Her disease course has been worsening. There are no hypoglycemic associated symptoms. There are no hypoglycemic complications. Diabetic complications include nephropathy. (gastroparesis) Risk factors for coronary artery disease include diabetes mellitus and post-menopausal. Current diabetic treatment includes insulin injections and diet. She is compliant with treatment most of the time. Her weight is fluctuating minimally. She is following a generally unhealthy diet. When asked about meal planning, she reported none. She has not had a previous visit with a dietitian. She rarely participates in exercise.  Her home blood glucose trend is increasing steadily. Her overall blood glucose range is >200 mg/dl. (She presents today with her CGM showing gross hyperglycemia overall.  Her most recent A1c, checked by her PCP on 12/13 was 9.6% improving slightly from last visit of 10%.  Analysis of her CGM shows TIR 13%, TAR 87%, TBR 0% with a GMI of 9.6%.  She admits she struggles with her addition to Pepsi.) An ACE inhibitor/angiotensin II receptor blocker is being taken. She does not see a podiatrist.Eye exam is current.    Review of systems  Constitutional: + Minimally fluctuating body weight,  current Body mass index is 24.37 kg/m. , no fatigue, no subjective hyperthermia, no subjective hypothermia Eyes: no blurry vision, no xerophthalmia ENT: no sore throat, no nodules palpated in throat, no dysphagia/odynophagia, no hoarseness Cardiovascular: no chest pain, no shortness of breath, no palpitations, no leg swelling Respiratory: no cough, no shortness of breath Gastrointestinal: no nausea/vomiting/diarrhea Musculoskeletal: no muscle/joint aches Skin: no rashes, no hyperemia Neurological: no tremors, no numbness, no tingling, no dizziness Psychiatric: no depression, no anxiety  Objective:     BP 129/67 (BP Location: Left Arm, Patient Position: Sitting, Cuff Size: Large)   Pulse 67   Ht 5\' 3"  (1.6 m)   Wt 137 lb 9.6 oz (62.4 kg)   BMI 24.37 kg/m   Wt Readings from Last 3 Encounters:  04/27/23 137 lb 9.6 oz (62.4 kg)  04/21/23 136 lb 3.2 oz (61.8 kg)  02/13/23 133 lb (60.3 kg)     BP Readings from Last 3 Encounters:  04/27/23 129/67  04/21/23 128/78  03/08/23 (!) 159/70      Physical Exam- Limited  Constitutional:  Body mass index is 24.37 kg/m. , not in acute distress, normal state of mind Eyes:  EOMI, no exophthalmos Neck: Supple Musculoskeletal: no gross deformities, strength intact in all four extremities, no gross restriction of joint movements Skin:  no rashes, no  hyperemia Neurological: no tremor with outstretched hands   Diabetic Foot Exam - Simple   No data filed     CMP ( most recent) CMP     Component Value Date/Time   NA  140 04/21/2023 1324   NA 144 07/05/2018 0000   NA 137 01/18/2013 1036   K 4.8 04/21/2023 1324   K 3.9 01/18/2013 1036   CL 106 04/21/2023 1324   CL 103 01/18/2013 1036   CO2 27 04/21/2023 1324   CO2 26 01/18/2013 1036   GLUCOSE 296 (H) 04/21/2023 1324   GLUCOSE 342 (H) 01/18/2013 1036   BUN 25 (H) 04/21/2023 1324   BUN 35 (A) 07/05/2018 0000   BUN 14 01/18/2013 1036   CREATININE 1.88 (H) 04/21/2023 1324   CREATININE 1.48 (H) 01/18/2013 1036   CALCIUM 9.5 04/21/2023 1324   CALCIUM 9.3 01/18/2013 1036   PROT 6.5 04/21/2023 1324   PROT 6.2 (L) 01/18/2013 1036   ALBUMIN 3.8 04/21/2023 1324   ALBUMIN 3.3 (L) 01/18/2013 1036   AST 23 04/21/2023 1324   AST 22 01/18/2013 1036   ALT 25 04/21/2023 1324   ALT 46 01/18/2013 1036   ALKPHOS 96 04/21/2023 1324   ALKPHOS 120 01/18/2013 1036   BILITOT 0.7 04/21/2023 1324   BILITOT 0.6 01/18/2013 1036   GFRNONAA 29 (L) 04/26/2022 1823   GFRNONAA 35 (L) 01/18/2013 1036   GFRAA 19 (L) 10/20/2019 1735   GFRAA 41 (L) 01/18/2013 1036     Diabetic Labs (most recent): Lab Results  Component Value Date   HGBA1C 9.6 (H) 04/21/2023   HGBA1C 10.0 (H) 01/06/2023   HGBA1C 8.9 (A) 10/06/2022   MICROALBUR 97.2 (H) 04/21/2023   MICROALBUR 4.5 (H) 08/02/2017     Lipid Panel ( most recent) Lipid Panel     Component Value Date/Time   CHOL 138 07/07/2022 0751   TRIG 109.0 07/07/2022 0751   HDL 42.20 07/07/2022 0751   CHOLHDL 3 07/07/2022 0751   VLDL 21.8 07/07/2022 0751   LDLCALC 74 07/07/2022 0751   LDLDIRECT 93.0 04/21/2023 1324      Lab Results  Component Value Date   TSH 4.41 11/08/2021   TSH 3.93 03/22/2017   TSH 0.719 07/09/2016           Assessment & Plan:   1) Type 2 diabetes mellitus with stage 3b chronic kidney disease, with long-term current  use of insulin (HCC)  She presents today with her CGM showing gross hyperglycemia overall.  Her most recent A1c, checked by her PCP on 12/13 was 9.6% improving slightly from last visit of 10%.  Analysis of her CGM shows TIR 13%, TAR 87%, TBR 0% with a GMI of 9.6%.  She admits she struggles with her addition to Pepsi.  - Nolene ELLON SORBELLO has currently uncontrolled symptomatic type 2 DM since 82 years of age.   -Recent labs reviewed.  - I had a long discussion with her about the progressive nature of diabetes and the pathology behind its complications. 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 These are all discussed in detail with her.  The following Lifestyle Medicine recommendations according to American College of Lifestyle Medicine [facility]) were discussed and offered to patient and she agrees to start the journey:  A. Whole Foods, Plant-based plate comprising of fruits and vegetables, plant-based proteins, whole-grain carbohydrates was discussed in detail with the patient.   A list for source of those nutrients were also provided to the patient.  Patient will use only water or unsweetened tea for hydration. B.  The need to stay away from  risky substances including alcohol, smoking; obtaining 7 to 9 hours of restorative sleep, at least 150 minutes of moderate intensity exercise weekly, the importance of healthy social connections,  and stress reduction techniques were discussed. C.  A full color page of  Calorie density of various food groups per pound showing examples of each food groups was provided to the patient.  - Nutritional counseling repeated at each appointment due to patients tendency to fall back in to old habits.  - The patient admits there is a room for improvement in their diet and drink choices. -  Suggestion is made for the  patient to avoid simple carbohydrates from their diet including Cakes, Sweet Desserts / Pastries, Ice Cream, Soda (diet and regular), Sweet Tea, Candies, Chips, Cookies, Sweet Pastries, Store Bought Juices, Alcohol in Excess of 1-2 drinks a day, Artificial Sweeteners, Coffee Creamer, and "Sugar-free" Products. This will help patient to have stable blood glucose profile and potentially avoid unintended weight gain.   - I encouraged the patient to switch to unprocessed or minimally processed complex starch and increased protein intake (animal or plant source), fruits, and vegetables.   - Patient is advised to stick to a routine mealtimes to eat 3 meals a day and avoid unnecessary snacks (to snack only to correct hypoglycemia).  - I have approached her with the following individualized plan to manage her diabetes and patient agrees:   -Avoiding hypoglycemia is a top priority in her care.  A good goal A1c for her would be 7%.  -She is advised to increase her Basaglar to 14 units SQ daily at bedtime.  We discussed the importance of restarting her dietary regimen, avoiding concentrated sweets.  We discussed the importance of routine, eating on a specific pattern so that her metabolism can correct itself.  -she is encouraged to continue monitoring glucose at least 2 times daily (using her CGM), before breakfast and before bed, and to call the clinic if she has readings less than 70 or above 300 for 3 tests in a row.  - she is warned not to take insulin without proper monitoring per orders. - Adjustment parameters are given to her for hypo and hyperglycemia in writing.  -She is not an ideal candidate for incretin therapy due to body habitus with BMI of 22.  - Specific targets for  A1c; LDL, HDL, and Triglycerides were discussed with the patient.  2) Blood Pressure /Hypertension:  her blood pressure is controlled to target for her age.   she is advised to continue her current medications including  Losartan 50 mg p.o. daily with breakfast, and Norvasc 10 mg po daily.  She also takes Lasix 40 po daily as needed for fluid.  She does see nephrology, will defer any med changes to them.  3) Lipids/Hyperlipidemia:    Review of her recent lipid panel from 07/07/22 showed controlled LDL at 74 .  she is advised to continue Crestor 10 mg daily at bedtime.  Side effects and precautions discussed with her.  4)  Weight/Diet:  her Body mass index is 24.37 kg/m.  -    she is NOT a candidate for weight loss.   Exercise, and detailed carbohydrates information provided  -  detailed on discharge instructions.  5) Chronic Care/Health Maintenance: -she is on ACEI/ARB and Statin medications and is encouraged to initiate and continue to follow up with Ophthalmology, Dentist, Podiatrist at least yearly or according to recommendations, and advised to stay away from smoking. I have recommended yearly  flu vaccine and pneumonia vaccine at least every 5 years; moderate intensity exercise for up to 150 minutes weekly; and sleep for at least 7 hours a day.  - she is advised to maintain close follow up with Birdie Sons Yehuda Mao, MD for primary care needs, as well as her other providers for optimal and coordinated care.     I spent  30  minutes in the care of the patient today including review of labs from CMP, Lipids, Thyroid Function, Hematology (current and previous including abstractions from other facilities); face-to-face time discussing  her blood glucose readings/logs, discussing hypoglycemia and hyperglycemia episodes and symptoms, medications doses, her options of short and long term treatment based on the latest standards of care / guidelines;  discussion about incorporating lifestyle medicine;  and documenting the encounter. Risk reduction counseling performed per USPSTF guidelines to reduce obesity and cardiovascular risk factors.     Please refer to Patient Instructions for Blood Glucose Monitoring and  Insulin/Medications Dosing Guide"  in media tab for additional information. Please  also refer to " Patient Self Inventory" in the Media  tab for reviewed elements of pertinent patient history.  Chaunice Gershon Cull participated in the discussions, expressed understanding, and voiced agreement with the above plans.  All questions were answered to her satisfaction. she is encouraged to contact clinic should she have any questions or concerns prior to her return visit.     Follow up plan: - Return in about 3 months (around 07/26/2023) for Diabetes F/U with A1c in office, No previsit labs, Bring meter and logs.   Ronny Bacon, [facility] [address] [place], [place] [address] Phone: [phone] Fax: [phone]  04/27/2023, 4:27 PM

## 2023-04-28 ENCOUNTER — Other Ambulatory Visit: Payer: Medicare Other

## 2023-04-28 NOTE — Addendum Note (Signed)
Addended by: Warden Fillers on: 04/28/2023 02:07 PM   Modules accepted: Orders

## 2023-05-01 ENCOUNTER — Other Ambulatory Visit (INDEPENDENT_AMBULATORY_CARE_PROVIDER_SITE_OTHER): Payer: Medicare Other

## 2023-05-01 DIAGNOSIS — I10 Essential (primary) hypertension: Secondary | ICD-10-CM | POA: Diagnosis not present

## 2023-05-02 LAB — BASIC METABOLIC PANEL
BUN/Creatinine Ratio: 13 (ref 12–28)
BUN: 22 mg/dL (ref 8–27)
CO2: 21 mmol/L (ref 20–29)
Calcium: 8.8 mg/dL (ref 8.7–10.3)
Chloride: 107 mmol/L — ABNORMAL HIGH (ref 96–106)
Creatinine, Ser: 1.67 mg/dL — ABNORMAL HIGH (ref 0.57–1.00)
Glucose: 347 mg/dL — ABNORMAL HIGH (ref 70–99)
Potassium: 4.5 mmol/L (ref 3.5–5.2)
Sodium: 142 mmol/L (ref 134–144)
eGFR: 31 mL/min/{1.73_m2} — ABNORMAL LOW (ref >59)

## 2023-05-06 DIAGNOSIS — Z888 Allergy status to other drugs, medicaments and biological substances status: Secondary | ICD-10-CM | POA: Diagnosis not present

## 2023-05-06 DIAGNOSIS — R1110 Vomiting, unspecified: Secondary | ICD-10-CM | POA: Diagnosis not present

## 2023-05-06 DIAGNOSIS — Z880 Allergy status to penicillin: Secondary | ICD-10-CM | POA: Diagnosis not present

## 2023-05-06 DIAGNOSIS — R112 Nausea with vomiting, unspecified: Secondary | ICD-10-CM | POA: Diagnosis not present

## 2023-05-06 DIAGNOSIS — Z7982 Long term (current) use of aspirin: Secondary | ICD-10-CM | POA: Diagnosis not present

## 2023-05-06 DIAGNOSIS — R935 Abnormal findings on diagnostic imaging of other abdominal regions, including retroperitoneum: Secondary | ICD-10-CM | POA: Diagnosis not present

## 2023-05-06 DIAGNOSIS — R079 Chest pain, unspecified: Secondary | ICD-10-CM | POA: Diagnosis not present

## 2023-05-06 DIAGNOSIS — Z885 Allergy status to narcotic agent status: Secondary | ICD-10-CM | POA: Diagnosis not present

## 2023-05-06 DIAGNOSIS — R1013 Epigastric pain: Secondary | ICD-10-CM | POA: Diagnosis not present

## 2023-05-26 ENCOUNTER — Other Ambulatory Visit: Payer: Medicare Other

## 2023-06-02 ENCOUNTER — Ambulatory Visit
Admission: RE | Admit: 2023-06-02 | Discharge: 2023-06-02 | Disposition: A | Discharge status: Home / Self Care | Payer: Medicare Other | Source: Ambulatory Visit | Attending: Family Medicine | Admitting: Family Medicine

## 2023-06-02 ENCOUNTER — Ambulatory Visit
Admission: EM | Admit: 2023-06-02 | Discharge: 2023-06-02 | Disposition: A | Discharge status: Home / Self Care | Payer: Medicare Other | Attending: Emergency Medicine | Admitting: Emergency Medicine

## 2023-06-02 DIAGNOSIS — J0100 Acute maxillary sinusitis, unspecified: Secondary | ICD-10-CM

## 2023-06-02 DIAGNOSIS — M810 Age-related osteoporosis without current pathological fracture: Secondary | ICD-10-CM

## 2023-06-02 DIAGNOSIS — Z780 Asymptomatic menopausal state: Secondary | ICD-10-CM | POA: Diagnosis not present

## 2023-06-02 MED ORDER — CEFDINIR 300 MG PO CAPS: 300.0000 mg | ORAL_CAPSULE | Freq: Two times a day (BID) | ORAL | 0 refills | Status: AC

## 2023-06-02 NOTE — ED Triage Notes (Signed)
Patient to Urgent Care with complaints of sinus pain and nasal congestion/ drainage. Denies any known fevers.   Symptoms started four weeks ago.   Using a nettipot/ mucinex extra strength/ antihistamine.

## 2023-06-02 NOTE — ED Provider Notes (Signed)
Kelly [name]    CSN: [id number] Arrival date & time: 06/02/23  1201      History   Chief Complaint Chief Complaint  Patient presents with   URI    HPI Kelly [name] is a 83 y.o. female.  Patient presents with 4-week history of sinus congestion, postnasal drip, sinus pressure.  She denies fever, cough, shortness of breath.  Treatment attempted with Mucinex, Nettie pot, antihistamine.  Patient states her symptoms started 2 days after receiving the RSV vaccine.  The history is provided by the patient and medical records.    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     Patient Active Problem List   Diagnosis Date Noted   Acute cystitis with hematuria 05/27/2022   Acute kidney injury superimposed on chronic kidney disease (HCC) 05/26/2022   Gastroparesis 05/26/2022   Memory difficulty 05/12/2022   Arthralgia 11/08/2021   Skin tear of lower leg without complication, initial encounter 10/01/2021   Thrombocytopenia (HCC) 07/02/2021   Trochanteric bursitis, right hip 12/08/2020   Foraminal stenosis of lumbosacral region 12/08/2020   Lumbar herniated disc 12/08/2020   Lumbar radiculopathy 12/02/2020   Rash 09/29/2020   Onychomycosis 08/11/2020   Allergic rhinitis 08/11/2020   Hyperlipidemia 11/15/2019   Low back pain 11/15/2019   Tick bite 10/01/2019   Type 2 diabetes mellitus (HCC) 03/18/2019   Hypertension 03/18/2019   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   Cervico-occipital neuralgia 07/23/2018   Osteoporosis 07/23/2018   Lesion of rectum 09/07/2017   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   Cervical spondylosis without myelopathy 08/15/2017   DDD (degenerative  disc disease), cervical 08/15/2017   Bursitis of hip 08/15/2017   Female stress incontinence 08/15/2017   Spondylolisthesis, congenital 08/15/2017   Radial styloid tenosynovitis 08/15/2017   Senile osteoporosis 08/15/2017   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   Rectal hemorrhage 07/25/2017   Essential tremor 04/26/2017   Left ventricular hypertrophy 10/12/2016   Chronic kidney disease, stage 4 (severe) (HCC) 08/26/2016   OSA on CPAP 08/26/2016    Past Surgical History:  Procedure Laterality Date   APPENDECTOMY     BREAST BIOPSY Right 06/19/2018   affirm stereo/x clip/COLUMNAR CELL CHANGE WITH MICROCALCIFICATIONS. FIBROCYSTIC CHANGES WITH MICROCALCIFICATIONS   cataract  03/2017   CHOLECYSTECTOMY     COLONOSCOPY     COLONOSCOPY WITH PROPOFOL N/A 08/22/2017   Procedure: COLONOSCOPY WITH PROPOFOL;  Surgeon: Wyline Mood, MD;  Location: [facility] ENDOSCOPY;  Service: Gastroenterology;  Laterality: N/A;   ESOPHAGOGASTRODUODENOSCOPY     ESOPHAGOGASTRODUODENOSCOPY (EGD) WITH PROPOFOL N/A 03/23/2016   Procedure: ESOPHAGOGASTRODUODENOSCOPY (EGD) WITH PROPOFOL;  Surgeon: Scot Jun, MD;  Location: [facility] ENDOSCOPY;  Service: Endoscopy;  Laterality: N/A;   FLEXIBLE SIGMOIDOSCOPY N/A 02/06/2018   Procedure: FLEXIBLE SIGMOIDOSCOPY;  Surgeon: Wyline Mood, MD;  Location: [facility] ENDOSCOPY;  Service: Gastroenterology;  Laterality: N/A;   FLEXIBLE SIGMOIDOSCOPY N/A 12/03/2018   Procedure: FLEXIBLE SIGMOIDOSCOPY;  Surgeon: Wyline Mood, MD;  Location: [facility] ENDOSCOPY;  Service: Gastroenterology;  Laterality: N/A;   RECTAL PROLAPSE REPAIR  2012   x2   skin cancer removed from hand  2024    OB History  No obstetric history on file.      Home Medications    Prior to Admission medications   Medication Sig Start Date End Date Taking? Authorizing Provider  cefdinir (OMNICEF) 300 MG capsule Take 1 capsule (300 mg total) by mouth 2 (two) times daily for 7 days. 06/02/23 06/09/23 Yes Mickie Bail, NP  amLODipine (NORVASC) 10 MG tablet Take 1 tablet (10 mg total) by mouth daily. 08/24/22   Glori Luis, MD  aspirin EC 81 MG tablet Take 81 mg by mouth daily.    [provider]  calcitRIOL (ROCALTROL) 0.25 MCG capsule Take 0.25 mcg by mouth daily.    [provider]  Calcium Citrate-Vitamin D 315-5 MG-MCG TABS Take 1 tablet by mouth daily.    [provider]  Cholecalciferol (VITAMIN D) 50 MCG (2000 UT) CAPS Take 2,000 Units by mouth daily.     [provider]  Continuous Glucose Sensor (FREESTYLE LIBRE 2 SENSOR) MISC Use to check glucose 4 times daily 01/10/23   Dani Gobble, NP  cyclobenzaprine (FLEXERIL) 10 MG tablet TAKE 1/2 TABLET (5MG  TOTAL)3 TIMES A DAY AS NEEDED    FOR MUSCLE SPASMS 01/28/20   Glori Luis, MD  diclofenac (FLECTOR) 1.3 % PTCH 1 patch 2 (two) times daily. 04/13/22   [provider]  furosemide (LASIX) 40 MG tablet Take 40 mg by mouth daily as needed.    [provider]  glucosamine-chondroitin 500-400 MG tablet Take 1 tablet by mouth 3 (three) times daily.    [provider]  HYDROcodone-acetaminophen (NORCO/VICODIN) 5-325 MG tablet Take 1 tablet by mouth every 4 (four) hours as needed.    [provider]  Insulin Glargine (BASAGLAR KWIKPEN) 100 UNIT/ML Inject 14 Units into the skin at bedtime. 04/27/23   Dani Gobble, NP  Insulin Pen Needle (BD PEN NEEDLE NANO 2ND GEN) 32G X 4 MM MISC Use to monitor glucose twice daily 01/10/23   Dani Gobble, NP  Lancets MISC Use up to 4 times daily to check blood sugars. 11/04/16   Tommie Sams, DO  losartan (COZAAR) 100 MG tablet Take 100 mg by mouth daily.    [provider]  Melatonin 5 MG CHEW Chew 5 mg by mouth at bedtime as needed.    [provider]  NARCAN 4 MG/0.1ML LIQD nasal spray kit  07/13/22   [provider]  ondansetron (ZOFRAN-ODT) 4 MG disintegrating tablet Take by mouth. 05/28/22    [provider]  potassium chloride (K-DUR) 10 MEQ tablet Take 10 mEq by mouth daily as needed.    [provider]  rosuvastatin (CRESTOR) 10 MG tablet Take 1 tablet (10 mg total) by mouth daily. 08/24/22   Glori Luis, MD  sertraline (ZOLOFT) 50 MG tablet Take 1 tablet (50 mg total) by mouth daily. 08/03/22   Glori Luis, MD  sertraline (ZOLOFT) 50 MG tablet Take 1 tablet (50 mg total) by mouth daily. 08/04/22   Glori Luis, MD    Family History Family History  Problem Relation Age of Onset   Stroke Mother    Arthritis Mother    Aortic aneurysm Sister    Lung cancer Sister    Heart attack Father 87   Breast cancer Sister 46    Social History Social History   Tobacco Use   Smoking status: Never   Smokeless tobacco: Never  Vaping Use   Vaping status: Never Used  Substance Use Topics  Alcohol use: No   Drug use: No     Allergies   Prilosec otc [omeprazole magnesium], Sucralfate, Amoxicillin, and Morphine and codeine   Review of Systems Review of Systems  Constitutional:  Negative for chills and fever.  HENT:  Positive for congestion, postnasal drip, rhinorrhea, sinus pressure and sinus pain. Negative for ear pain and sore throat.   Respiratory:  Negative for cough and shortness of breath.      Physical Exam Triage Vital Signs ED Triage Vitals  Encounter Vitals Group     BP 06/02/23 1426 134/74     Systolic BP Percentile --      Diastolic BP Percentile --      Pulse Rate 06/02/23 1426 63     Resp 06/02/23 1426 18     Temp 06/02/23 1426 98 F (36.7 C)     Temp src --      SpO2 06/02/23 1426 98 %     Weight --      Height --      Head Circumference --      Peak Flow --      Pain Score 06/02/23 1424 2     Pain Loc --      Pain Education --      Exclude from Growth Chart --    No data found.  Updated Vital Signs BP 134/74   Pulse 63   Temp 98 F (36.7 C)   Resp 18   SpO2 98%   Visual Acuity Right Eye Distance:    Left Eye Distance:   Bilateral Distance:    Right Eye Near:   Left Eye Near:    Bilateral Near:     Physical Exam Constitutional:      General: She is not in acute distress. HENT:     Right Ear: Tympanic membrane normal.     Left Ear: Tympanic membrane normal.     Nose: Congestion present.     Mouth/Throat:     Mouth: Mucous membranes are moist.     Pharynx: Oropharynx is clear.  Cardiovascular:     Rate and Rhythm: Normal rate and regular rhythm.     Heart sounds: Normal heart sounds.  Pulmonary:     Effort: Pulmonary effort is normal. No respiratory distress.     Breath sounds: Normal breath sounds.  Neurological:     Mental Status: She is alert.      UC Treatments / Results  Labs (all labs ordered are listed, but only abnormal results are displayed) Labs Reviewed - No data to display  EKG   Radiology DG Bone Density Result Date: 06/02/2023 EXAM: DUAL X-RAY ABSORPTIOMETRY (DXA) FOR BONE MINERAL DENSITY IMPRESSION: Your patient Timberlyn Grays completed a BMD test on 06/02/2023 using the [employer] iDXA DXA System (software version: 14.10) manufactured by [employer]. The following summarizes the results of our evaluation. Technologist: SCE PATIENT BIOGRAPHICAL: Name: Mosella, Kasa Patient ID: [id number] Birth Date: 23-May-1940 Height: 63.0 in. Gender: Female Exam Date: 06/02/2023 Weight: 136.8 lbs. Indications: Advanced Age, Caucasian, Diabetic, Early Menopause, Height Loss, Kidney Disease, Postmenopausal Fractures: Treatments: Calcium, Insulin, Prolia, Vitamin D DENSITOMETRY RESULTS: Site         Region     Measured Date Measured Age WHO Classification Young Adult T-score BMD         %Change vs. Previous Significant Change (*) DualFemur Neck Right 06/02/2023 82.0 Osteopenia -2.3 0.719 g/cm2 11.3% Yes DualFemur Neck Right 10/17/2019 78.3 Osteoporosis -2.8 0.646 g/cm2 -  5.6% - DualFemur Neck Right 08/23/2017 76.2 Osteoporosis -2.5 0.684 g/cm2 - - DualFemur  Total Mean 06/02/2023 82.0 Osteopenia -1.1 0.873 g/cm2 4.1% Yes DualFemur Total Mean 10/17/2019 78.3 Osteopenia -1.3 0.839 g/cm2 2.2% Yes DualFemur Total Mean 08/23/2017 76.2 Osteopenia -1.5 0.821 g/cm2 - - Left Forearm Radius 33% 06/02/2023 82.0 Osteoporosis -2.7 0.642 g/cm2 -0.9% - Left Forearm Radius 33% 10/17/2019 78.3 Osteoporosis -2.6 0.648 g/cm2 -3.1% - Left Forearm Radius 33% 08/23/2017 76.2 Osteopenia -2.4 0.669 g/cm2 - - ASSESSMENT: The BMD measured at Forearm Radius 33% is 0.642 g/cm2 with a T-score of -2.7. This patient is considered osteoporotic according to World Health Organization [facility]) criteria. The scan quality is good. Lumbar spine was not utilized due to advanced degenerative changes. Compared with prior study, there has been a significant increase in the total hip. World Science writer [facility]) criteria for post-menopausal, Caucasian Women: Normal:                   T-score at or above -1 SD Osteopenia/low bone mass: T-score between -1 and -2.5 SD Osteoporosis:             T-score at or below -2.5 SD RECOMMENDATIONS: 1. All patients should optimize calcium and vitamin D intake. 2. Consider FDA-approved medical therapies in postmenopausal women and men aged 41 years and older, based on the following: a. A hip or vertebral(clinical or morphometric) fracture b. T-score < -2.5 at the femoral neck or spine after appropriate evaluation to exclude secondary causes c. Low bone mass (T-score between -1.0 and -2.5 at the femoral neck or spine) and a 10-year probability of a hip fracture > 3% or a 10-year probability of a major osteoporosis-related fracture > 20% based on the [place]-adapted WHO algorithm 3. Clinician judgment and/or patient preferences may indicate treatment for people with 10-year fracture probabilities above or below these levels FOLLOW-UP: People with diagnosed cases of osteoporosis or at high risk for fracture should have regular bone mineral density tests. For patients eligible for  Medicare, routine testing is allowed once every 2 years. The testing frequency can be increased to one year for patients who have rapidly progressing disease, those who are receiving or discontinuing medical therapy to restore bone mass, or have additional risk factors. I have reviewed this report, and agree with the above findings. [facility] Electronically Signed   By: Frederico Hamman M.D.   On: 06/02/2023 11:50    Procedures Procedures (including critical care time)  Medications Ordered in UC Medications - No data to display  Initial Impression / Assessment and Plan / UC Course  I have reviewed the triage vital signs and the nursing notes.  Pertinent labs & imaging results that were available during my care of the patient were reviewed by me and considered in my medical decision making (see chart for details).    Acute sinusitis.  Patient has been symptomatic for several weeks.  Treating with cefdinir.  Education provided on sinus infection.  Instructed patient to follow up with her PCP if her symptoms are not improving.  She agrees to plan of care.   Final Clinical Impressions(s) / UC Diagnoses   Final diagnoses:  Acute non-recurrent maxillary sinusitis     Discharge Instructions      Take the cefdinir as directed.  Follow-up with your primary care provider if your symptoms are not improving.      ED Prescriptions     Medication Sig Dispense Auth. Provider   cefdinir (OMNICEF) 300 MG capsule  Take 1 capsule (300 mg total) by mouth 2 (two) times daily for 7 days. 14 capsule Mickie Bail, NP      PDMP not reviewed this encounter.   Mickie Bail, NP 06/02/23 [phone]

## 2023-06-02 NOTE — Discharge Instructions (Signed)
Take the cefdinir as directed.  Follow-up with your primary care provider if your symptoms are not improving.

## 2023-06-05 ENCOUNTER — Encounter: Payer: Self-pay | Admitting: Family Medicine

## 2023-06-14 DIAGNOSIS — M546 Pain in thoracic spine: Secondary | ICD-10-CM | POA: Diagnosis not present

## 2023-06-14 DIAGNOSIS — M542 Cervicalgia: Secondary | ICD-10-CM | POA: Diagnosis not present

## 2023-06-14 DIAGNOSIS — E1165 Type 2 diabetes mellitus with hyperglycemia: Secondary | ICD-10-CM | POA: Diagnosis not present

## 2023-06-14 DIAGNOSIS — M5032 Other cervical disc degeneration, mid-cervical region, unspecified level: Secondary | ICD-10-CM | POA: Diagnosis not present

## 2023-06-14 DIAGNOSIS — M47812 Spondylosis without myelopathy or radiculopathy, cervical region: Secondary | ICD-10-CM | POA: Diagnosis not present

## 2023-06-14 DIAGNOSIS — M461 Sacroiliitis, not elsewhere classified: Secondary | ICD-10-CM | POA: Diagnosis not present

## 2023-06-14 DIAGNOSIS — M25572 Pain in left ankle and joints of left foot: Secondary | ICD-10-CM | POA: Diagnosis not present

## 2023-06-14 DIAGNOSIS — Q762 Congenital spondylolisthesis: Secondary | ICD-10-CM | POA: Diagnosis not present

## 2023-06-14 DIAGNOSIS — Z79899 Other long term (current) drug therapy: Secondary | ICD-10-CM | POA: Diagnosis not present

## 2023-06-14 DIAGNOSIS — M533 Sacrococcygeal disorders, not elsewhere classified: Secondary | ICD-10-CM | POA: Diagnosis not present

## 2023-06-14 DIAGNOSIS — M7062 Trochanteric bursitis, left hip: Secondary | ICD-10-CM | POA: Diagnosis not present

## 2023-06-14 DIAGNOSIS — T2101XA Burn of unspecified degree of chest wall, initial encounter: Secondary | ICD-10-CM | POA: Diagnosis not present

## 2023-06-22 ENCOUNTER — Encounter: Payer: Self-pay | Admitting: Family Medicine

## 2023-06-22 ENCOUNTER — Ambulatory Visit (INDEPENDENT_AMBULATORY_CARE_PROVIDER_SITE_OTHER): Payer: Medicare Other | Admitting: Family Medicine

## 2023-06-22 VITALS — BP 130/64 | HR 79 | Temp 98.2°F | Resp 18 | Ht 63.0 in | Wt 134.1 lb

## 2023-06-22 DIAGNOSIS — E1159 Type 2 diabetes mellitus with other circulatory complications: Secondary | ICD-10-CM

## 2023-06-22 DIAGNOSIS — E559 Vitamin D deficiency, unspecified: Secondary | ICD-10-CM

## 2023-06-22 DIAGNOSIS — E538 Deficiency of other specified B group vitamins: Secondary | ICD-10-CM

## 2023-06-22 DIAGNOSIS — N184 Chronic kidney disease, stage 4 (severe): Secondary | ICD-10-CM

## 2023-06-22 DIAGNOSIS — E1169 Type 2 diabetes mellitus with other specified complication: Secondary | ICD-10-CM | POA: Diagnosis not present

## 2023-06-22 DIAGNOSIS — I152 Hypertension secondary to endocrine disorders: Secondary | ICD-10-CM | POA: Diagnosis not present

## 2023-06-22 DIAGNOSIS — F39 Unspecified mood [affective] disorder: Secondary | ICD-10-CM | POA: Diagnosis not present

## 2023-06-22 DIAGNOSIS — J069 Acute upper respiratory infection, unspecified: Secondary | ICD-10-CM

## 2023-06-22 DIAGNOSIS — Z7984 Long term (current) use of oral hypoglycemic drugs: Secondary | ICD-10-CM

## 2023-06-22 DIAGNOSIS — E118 Type 2 diabetes mellitus with unspecified complications: Secondary | ICD-10-CM

## 2023-06-22 DIAGNOSIS — E785 Hyperlipidemia, unspecified: Secondary | ICD-10-CM | POA: Diagnosis not present

## 2023-06-22 DIAGNOSIS — J309 Allergic rhinitis, unspecified: Secondary | ICD-10-CM | POA: Diagnosis not present

## 2023-06-22 MED ORDER — BENZONATATE 100 MG PO CAPS: 200.0000 mg | ORAL_CAPSULE | Freq: Three times a day (TID) | ORAL | 0 refills | Status: DC | PRN

## 2023-06-22 MED ORDER — FLUTICASONE PROPIONATE 50 MCG/ACT NA SUSP: 2.0000 | Freq: Two times a day (BID) | NASAL | 11 refills | Status: DC

## 2023-06-22 MED ORDER — AZELASTINE HCL 0.1 % NA SOLN: 2.0000 | Freq: Two times a day (BID) | NASAL | 12 refills | Status: DC

## 2023-06-22 NOTE — Patient Instructions (Addendum)
It was a pleasure meeting you today. Thank you for allowing me to take part in your health care.  Our goals for today as we discussed include:  Start Flonase and Astelin spray as prescribed  Schedule fasting lab appointment.  Fast for 10 hours  Follow up in 3 months  Please bring in all your medications at your next visit  This is a list of the screening recommended for you and due dates:  Health Maintenance  Topic Date Due   COVID-19 Vaccine (5 - 2024-25 season) 01/08/2023   Flu Shot  08/07/2023*   Complete foot exam   10/06/2023   Hemoglobin A1C  10/20/2023   Eye exam for diabetics  12/21/2023   Medicare Annual Wellness Visit  02/13/2024   Yearly kidney health urinalysis for diabetes  04/20/2024   Yearly kidney function blood test for diabetes  04/30/2024   DTaP/Tdap/Td vaccine (2 - Tdap) 02/25/2029   Pneumonia Vaccine  Completed   DEXA scan (bone density measurement)  Completed   Zoster (Shingles) Vaccine  Completed   HPV Vaccine  Aged Out   Hepatitis C Screening  Discontinued  *Topic was postponed. The date shown is not the original due date.      If you have any questions or concerns, please do not hesitate to call the office at [phone].  I look forward to our next visit and until then take care and stay safe.  Regards,   Dana Allan, MD   [facility]

## 2023-06-22 NOTE — Progress Notes (Signed)
 SUBJECTIVE:   Chief Complaint  Patient presents with   Establish Care    Transferring from Dr. Birdie [name]   HPI Presents to clinic to transfer care   Discussed the use of AI scribe software for clinical note transcription with the patient, who gave verbal consent to proceed.  History of Present Illness Kelly [name] is an 83 year old female with type 2 diabetes and stage 4 kidney disease who presents for a medication review and management of her diabetes. She is accompanied by her daughter, Kelly [name]. She was referred by Dr. Birdie [name] for diabetes management.  Her type 2 diabetes is currently managed with Basaglar insulin at 14 units, which was gradually increased from 8 units. She struggles with maintaining a consistent insulin schedule, leading to timing adjustments from morning to night. She previously used Ozempic and Victoza but discontinued due to gastroparesis and vomiting. Kelly [name] was also stopped due to urinary tract infections. Her A1c was last noted to be 9.6, indicating suboptimal control.  She has stage 4 kidney disease, diagnosed approximately five to six years ago after an episode of acute kidney failure with a GFR as low as 4. Her GFR has improved to 32 under the care of Dr. Thedore [name]. She manages her condition with careful monitoring of medications and symptoms. She uses Lasix sparingly due to concerns about kidney function impact.  She reports recent sinus congestion and sore throat following an RSV vaccination on December 30th, persisting despite Mucinex, antihistamines, and antibiotics. No fever, chest pain, or shortness of breath, but ongoing congestion and sore throat. No known seasonal or environmental allergies.  Her current medication regimen includes amlodipine 10 mg, aspirin, vitamin D, calcitriol, calcium, cholecalciferol, Flexeril as needed, diclofenac gel or patch, Lasix as needed for leg swelling, glucosamine, Norco for back pain due to arthritis, losartan  100 mg, melatonin occasionally, Zofran as needed, potassium as needed with Lasix, Crestor 10 mg, and Zoloft 50 mg.  She lives in [place] and travels to [place] for diabetes management. She has a supportive family, including her daughter Kelly [name], who assists with her care. She maintains a good social life, which sometimes leads to dietary indiscretions, such as consuming buttered popcorn and Pepsi at night.    PERTINENT PMH / PSH: As above  OBJECTIVE:  BP 130/64   Pulse 79   Temp 98.2 F (36.8 C)   Resp 18   Ht 5\' 3"  (1.6 m)   Wt 134 lb 2 oz (60.8 kg)   SpO2 93%   BMI 23.76 kg/m    Physical Exam Vitals reviewed.  Constitutional:      General: She is not in acute distress.    Appearance: She is normal weight. She is not ill-appearing.  HENT:     Head: Normocephalic.     Right Ear: Tympanic membrane, ear canal and external ear normal.     Left Ear: Tympanic membrane, ear canal and external ear normal.     Nose: Nose normal.     Mouth/Throat:     Mouth: Mucous membranes are moist.  Eyes:     Extraocular Movements: Extraocular movements intact.     Conjunctiva/sclera: Conjunctivae normal.     Pupils: Pupils are equal, round, and reactive to light.  Neck:     Thyroid: No thyromegaly or thyroid tenderness.     Vascular: No carotid bruit.  Cardiovascular:     Rate and Rhythm: Normal rate and regular rhythm.     Pulses: Normal pulses.  Heart sounds: Normal heart sounds.  Pulmonary:     Effort: Pulmonary effort is normal.     Breath sounds: Normal breath sounds.  Abdominal:     General: Bowel sounds are normal. There is no distension.     Palpations: Abdomen is soft.     Tenderness: There is no abdominal tenderness. There is no right CVA tenderness, left CVA tenderness, guarding or rebound.  Musculoskeletal:        General: Normal range of motion.     Cervical back: Normal range of motion.     Right lower leg: No edema.     Left lower leg: No edema.  Lymphadenopathy:      Cervical: No cervical adenopathy.  Skin:    Capillary Refill: Capillary refill takes less than 2 seconds.  Neurological:     General: No focal deficit present.     Mental Status: She is alert and oriented to person, place, and time. Mental status is at baseline.     Motor: No weakness.  Psychiatric:        Mood and Affect: Mood normal.        Behavior: Behavior normal.        Thought Content: Thought content normal.        Judgment: Judgment normal.           06/22/2023    1:01 PM 04/21/2023    1:16 PM 02/13/2023    2:24 PM 01/06/2023    2:13 PM 04/08/2022    2:18 PM  Depression screen PHQ 2/9  Decreased Interest 0 0 0 0 0  Down, Depressed, Hopeless 0 0 0 0 0  PHQ - 2 Score 0 0 0 0 0  Altered sleeping 0 0 2 0   Tired, decreased energy 0 0 1 0   Change in appetite 0 0 0 0   Feeling bad or failure about yourself  0 0 0 0   Trouble concentrating 0 0 0 0   Moving slowly or fidgety/restless 0 0 0 0   Suicidal thoughts 0 0 0 0   PHQ-9 Score 0 0 3 0   Difficult doing work/chores Not difficult at all Not difficult at all Not difficult at all Not difficult at all       06/22/2023    1:01 PM 04/21/2023    1:16 PM 01/06/2023    2:13 PM 05/12/2022   11:10 AM  GAD 7 : Generalized Anxiety Score  Nervous, Anxious, on Edge 0 0 0 0  Control/stop worrying 0 0 0 0  Worry too much - different things 0 0 0 0  Trouble relaxing 0 0 0 0  Restless 0 0 0 0  Easily annoyed or irritable 0 0 0 0  Afraid - awful might happen 0 0 0 0  Total GAD 7 Score 0 0 0 0  Anxiety Difficulty Not difficult at all Not difficult at all Not difficult at all Not difficult at all    ASSESSMENT/PLAN:  Allergic rhinitis, unspecified seasonality, unspecified trigger -     Fluticasone Propionate; Place 2 sprays into both nostrils 2 (two) times daily.  Dispense: 11.1 mL; Refill: 11 -     Azelastine HCl; Place 2 sprays into both nostrils 2 (two) times daily. Use in each nostril as directed  Dispense: 30 mL; Refill:  12 -     Benzonatate; Take 2 capsules (200 mg total) by mouth 3 (three) times daily as needed for cough.  Dispense: 20 capsule;  Refill: 0  Type 2 diabetes mellitus with complications (HCC) Assessment & Plan: Uncontrolled with A1c of 9.6. Currently on Basaglar 14 units. Discussed the potential benefits of adding [place] or Jardiance for kidney, heart, and diabetes management. Concerns about hypoglycemic events with increasing insulin doses. -Consider adding Kelly [name] or Jardiance after reviewing patient's chart and discussing with patient at next visit. -Follows with Endocrinology  Orders: -     Comprehensive metabolic panel; Future -     Microalbumin / creatinine urine ratio; Future  Chronic kidney disease, stage 4 (severe) (HCC) Assessment & Plan: Discussed the potential harm of Lasix on kidney function if used for leg swelling. -Follow up with Nephrology if needing to continue given decreasing GFR. - Recent GFR 31 -Will defer refills to Nephro  Orders: -     Comprehensive metabolic panel; Future -     Microalbumin / creatinine urine ratio; Future  Hypertension associated with diabetes [facility]) Assessment & Plan: Well controlled on current medication.  Goal <140/90 per JNC 8 guidelines -Continue Amlodipine 10 mg daily, consider switching if continues to have lower extremity edema -Continue Losartan 100 mg daily -Continue to monitor BP at home  Orders: -     Comprehensive metabolic panel; Future -     Microalbumin / creatinine urine ratio; Future  Viral URI Assessment & Plan: Recent onset of sore throat and congestion. No fever. Likely viral infection. Discussed potential benefits of Flonase and Astelin. -Prescribe Flonase and Astelin nasal sprays, two sprays in each nostril twice a day. -Prescribe Tessalon Perles for cough suppression.   Hyperlipidemia associated with type 2 diabetes mellitus (HCC) Assessment & Plan: Currently on statin and tolerating well -Continue Crestor  10 mg daily -Check fasting lipids at next visit  Orders: -     Lipid panel; Future  Vitamin D deficiency -     VITAMIN D 25 Hydroxy (Vit-D Deficiency, Fractures); Future  Vitamin B 12 deficiency -     Vitamin B12; Future  Mood disorder [facility]) Assessment & Plan: Managed well on current medication.  Denies SI/HI -Continue Zoloft 50 mg daily    Follows with Pain management for refills of Norco, Diclofenac patch   PDMP reviewed  Return in about 3 months (around 09/19/2023) for PCP.  Dana Allan, MD

## 2023-06-29 ENCOUNTER — Encounter: Payer: Self-pay | Admitting: Family Medicine

## 2023-06-29 DIAGNOSIS — F39 Unspecified mood [affective] disorder: Secondary | ICD-10-CM | POA: Insufficient documentation

## 2023-06-29 DIAGNOSIS — E559 Vitamin D deficiency, unspecified: Secondary | ICD-10-CM | POA: Insufficient documentation

## 2023-06-29 DIAGNOSIS — E538 Deficiency of other specified B group vitamins: Secondary | ICD-10-CM | POA: Insufficient documentation

## 2023-06-29 DIAGNOSIS — J069 Acute upper respiratory infection, unspecified: Secondary | ICD-10-CM | POA: Insufficient documentation

## 2023-06-29 NOTE — Assessment & Plan Note (Signed)
 Uncontrolled with A1c of 9.6. Currently on Basaglar 14 units. Discussed the potential benefits of adding [place] or Jardiance for kidney, heart, and diabetes management. Concerns about hypoglycemic events with increasing insulin doses. -Consider adding Kelly [name] or Jardiance after reviewing patient's chart and discussing with patient at next visit. -Follows with Endocrinology

## 2023-06-29 NOTE — Assessment & Plan Note (Signed)
 Managed well on current medication.  Denies SI/HI -Continue Zoloft 50 mg daily

## 2023-06-29 NOTE — Assessment & Plan Note (Signed)
 Currently on statin and tolerating well -Continue Crestor 10 mg daily -Check fasting lipids at next visit

## 2023-06-29 NOTE — Assessment & Plan Note (Addendum)
 Discussed the potential harm of Lasix on kidney function if used for leg swelling. -Follow up with Nephrology if needing to continue given decreasing GFR. - Recent GFR 31 -Will defer refills to Nephro

## 2023-06-29 NOTE — Assessment & Plan Note (Signed)
 Well controlled on current medication.  Goal <140/90 per JNC 8 guidelines -Continue Amlodipine 10 mg daily, consider switching if continues to have lower extremity edema -Continue Losartan 100 mg daily -Continue to monitor BP at home

## 2023-06-29 NOTE — Assessment & Plan Note (Signed)
 Recent onset of sore throat and congestion. No fever. Likely viral infection. Discussed potential benefits of Flonase and Astelin. -Prescribe Flonase and Astelin nasal sprays, two sprays in each nostril twice a day. -Prescribe Tessalon Perles for cough suppression.

## 2023-07-21 ENCOUNTER — Ambulatory Visit: Payer: Self-pay | Admitting: Family Medicine

## 2023-07-21 ENCOUNTER — Encounter: Payer: Self-pay | Admitting: Internal Medicine

## 2023-07-21 ENCOUNTER — Ambulatory Visit: Admitting: Internal Medicine

## 2023-07-21 VITALS — BP 130/62 | Ht 63.0 in | Wt 132.7 lb

## 2023-07-21 DIAGNOSIS — N3000 Acute cystitis without hematuria: Secondary | ICD-10-CM

## 2023-07-21 DIAGNOSIS — T63421A Toxic effect of venom of ants, accidental (unintentional), initial encounter: Secondary | ICD-10-CM

## 2023-07-21 LAB — POCT URINE DIPSTICK
Bilirubin, UA: NEGATIVE
Glucose, UA: NEGATIVE mg/dL
Ketones, POC UA: NEGATIVE mg/dL
Nitrite, UA: NEGATIVE
POC PROTEIN,UA: 100 — AB
Spec Grav, UA: 1.025 (ref 1.010–1.025)
Urobilinogen, UA: 0.2 U/dL
pH, UA: 6 (ref 5.0–8.0)

## 2023-07-21 MED ORDER — FLUOCINONIDE EMULSIFIED BASE 0.05 % EX CREA: 1.0000 | TOPICAL_CREAM | Freq: Four times a day (QID) | CUTANEOUS | 0 refills | Status: DC

## 2023-07-21 MED ORDER — NITROFURANTOIN MONOHYD MACRO 100 MG PO CAPS: 100.0000 mg | ORAL_CAPSULE | Freq: Two times a day (BID) | ORAL | 0 refills | Status: DC

## 2023-07-21 NOTE — Telephone Encounter (Signed)
   Chief Complaint: ant bites- severe itching Symptoms: multiple ant bites- wrist/arm, itching-severe Frequency: bitten yesterday  Pertinent Negatives: Patient denies abdomen pain, face or tongue swelling, new rash elsewhere, vomiting Disposition: [] ED /[] Urgent Care (no appt availability in office) / [x] Appointment(In office/virtual)/ []  [facility]/ [] Home Care/ [] Refused Recommended Disposition /[] [facility]/ []  Follow-up with PCP Additional Notes: Patient has been scheduled for evaluation    Copied from CRM [id number]. Topic: Clinical - Red Word Triage >> Jul 21, 2023 12:35 PM Melissa C wrote: Red Word that prompted transfer to Nurse Triage: yesterday patient was cleaning up and disturbed a fire ant nest and got bitten on her hand/wrist/arm and it is extremely itchy. She isn't sure what she should put on these type of bites. Reason for Disposition  [1] SEVERE local itching (i.e., interferes with work, school, sleep) AND [2] not improved after 24 hours of hydrocortisone cream  Answer Assessment - Initial Assessment Questions 1. SEVERITY: "How many stings are there?"     numerous 2. ONSET: "When did it occur?"      Yesterday- was cleaning debris  3. LOCATION: "Where is the sting located?"  "How many stings?"     Wrist, arm to elbow 4. SWELLING: "How big is the swelling?" (e.g., inches or cm)     no 5. REDNESS: "Is the area red or pink?" If Yes, ask: "What size is the area of redness?" (e.g., inches or cm). "When did the redness start?"     no 6. PAIN: "Is there any pain?" If Yes, ask: "How bad is it?"  (Scale 1-10; or mild, moderate, severe)     Yes- just wants to claw them 7. ITCHING: "Is there any itching?" If Yes, ask: "How bad is it?"      Yes- severe 8. RESPIRATORY DISTRESS: "Describe your breathing."     normal 9. PRIOR REACTIONS: "Have you had any severe allergic reactions to stings in the past?" If Yes, ask: "What happened?"     Only one time- years  ago 10. OTHER SYMPTOMS: "Do you have any other symptoms?" (e.g., abdomen pain, face or tongue swelling, new rash elsewhere, vomiting)       no  Protocols used: [employer], Insect Bite-A-AH

## 2023-07-21 NOTE — Patient Instructions (Signed)
Insect Bite, Adult An insect bite can make your skin red, itchy, and swollen. Some insects can spread disease to people with a bite. However, most insect bites do not lead to disease, and most are not serious. What are the causes? Insects may bite for many reasons, including: Hunger. To defend themselves. Insects that bite include: Spiders. Mosquitoes. Flies. Ticks and fleas. Ants. Kissing bugs. Chiggers. What are the signs or symptoms? Symptoms often last for 2-4 days. However, itching can last up to 10 days. Symptoms include: Itching or pain in the bite area. Redness and swelling in the bite area. An open wound. In rare cases, a person may have a very bad allergic reaction (anaphylactic reaction) to a bite. Symptoms of an anaphylactic reaction may include: Feeling warm in the face (flushed). Your face may turn red. Itchy, red, swollen areas of skin (hives). Swelling of the eyes, lips, face, mouth, tongue, or throat. Trouble with breathing, talking, or swallowing. High-pitched whistling sounds, most often when breathing out (wheezing). Feeling dizzy or light-headed. Fainting. Pain or cramps in your belly (abdomen). Vomiting. Watery poop (diarrhea). How is this treated? Most insect bites are not serious. Symptoms often go away on their own. When treatment is advised, it may include: Putting ice on the bite area. Putting a cream or lotion, like calamine lotion, on the bite area. This helps with itching. Using medicines called antihistamines. You may also need: A tetanus shot if you are not up to date. An antibiotic cream or medicine. This treatment is needed if the bite area gets infected. Follow these instructions at home: Bite area care  Do not scratch the bite area. It may help to cover the bite area with a bandage or close-fitting clothing. Keep the bite area clean and dry. Check the bite area every day for signs of infection. Check for: More redness, swelling, or  pain. Fluid or blood. Warmth. Pus or a bad smell. Wash your hands often. Managing pain, itching, and swelling  You may put any of these on the bite area as told by your doctor: A paste made of baking soda and water. Cortisone cream. Calamine lotion. If told, put ice on the bite area. To do this: Put ice in a plastic bag. Place a towel between your skin and the bag. Leave the ice on for 20 minutes, 2-3 times a day. If your skin turns bright red, take off the ice right away to prevent skin damage. The risk of skin damage is higher if you cannot feel pain, heat, or cold. General instructions Apply or take over-the-counter and prescription medicines only as told by your doctor. If you were prescribed antibiotics, take or apply them as told by your doctor. Do not stop using them even if you start to feel better. How is this prevented? To help you have a lower risk of insect bites: When you are outside, wear clothes that cover your arms and legs. Use insect repellent. The best insect repellents contain one of these: DEET. Picaridin. Oil of lemon eucalyptus (OLE). IR3535. Consider spraying your clothing with a pesticide called permethrin. Permethrin helps prevent insect bites. It works for several weeks and for up to 5-6 clothing washes. Do not apply permethrin directly to the skin. If your home windows do not have screens, think about putting some in. If you will be sleeping in an area where there are mosquitoes, consider covering your sleeping area with a mosquito net. Contact a doctor if: You have redness, swelling, or pain   in the bite area. You have fluid or blood coming from the bite area. The bite area feels warm to the touch. You have pus or a bad smell coming from the bite area. You have a fever. Get help right away if: You have joint pain. You have a rash. You feel weak or more tired than you normally do. You have neck pain or a headache. You have signs of an anaphylactic  reaction. Signs may include: Swelling of your eyes, lips, face, mouth, tongue, or throat. Feeling warm in the face. Itchy, red, swollen areas of skin. Trouble with breathing, talking, or swallowing. Wheezing. Feeling dizzy or light-headed. Fainting. Pain or cramps in your belly. Vomiting or watery poop. These symptoms may be an emergency. Get help right away. Call 911. Do not wait to see if symptoms will go away. Do not drive yourself to the hospital. Summary An insect bite can make your skin red, itchy, and swollen. Treatment is usually not needed. Symptoms often go away on their own. Do not scratch the bite area. Keep it clean and dry. Use insect repellent to help prevent insect bites. Contact a doctor if you have signs of infection. This information is not intended to replace advice given to you by your health care provider. Make sure you discuss any questions you have with your health care provider. Document Revised: 07/20/2021 Document Reviewed: 07/20/2021 Elsevier Patient Education  2024 Elsevier Inc.  

## 2023-07-21 NOTE — Progress Notes (Signed)
 Subjective:    Patient ID: Kelly [name], female    DOB: [birth year], 83 y.o.   MRN: [id number]  HPI  Discussed the use of AI scribe software for clinical note transcription with the patient, who gave verbal consent to proceed.   Kelly [name] is a 83 year old female who presents with fire ant bites and urinary symptoms.  She was bitten by fire ants two days ago while helping a friend clean up piles of rotten boards. The bites are itchy, and she has been applying 'red oil', a liquid for insect bites, but it has not been very effective. She has also used Benadryl and Goldbond medicated powder.   She is experiencing urinary symptoms that suggest a urinary tract infection (UTI). She has a burning sensation after urination and a decrease in urine output over the past couple of days. No urgency, frequency, blood in the urine, low back pain, nausea, or vomiting.       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     Current Outpatient Medications  Medication Sig Dispense Refill   amLODipine (NORVASC) 10 MG tablet Take 1 tablet (10 mg total) by mouth daily. 90 tablet 3   aspirin EC 81 MG tablet Take 81 mg by mouth daily.     azelastine (ASTELIN) 0.1 % nasal spray Place 2 sprays into both nostrils 2 (two) times daily. Use in each nostril as directed 30 mL 12   benzonatate (TESSALON PERLES) 100 MG capsule Take 2 capsules (200 mg total) by mouth 3 (three) times daily as needed for cough. 20 capsule 0   calcitRIOL (ROCALTROL) 0.25 MCG capsule Take 0.25 mcg by mouth daily.     Calcium Citrate-Vitamin D 315-5 MG-MCG TABS Take 1 tablet by mouth daily.     Cholecalciferol (VITAMIN D) 50 MCG (2000  UT) CAPS Take 2,000 Units by mouth daily.      Continuous Glucose Sensor (FREESTYLE LIBRE 2 SENSOR) MISC Use to check glucose 4 times daily 6 each 3   diclofenac (FLECTOR) 1.3 % PTCH 1 patch 2 (two) times daily.     fluticasone (FLONASE) 50 MCG/ACT nasal spray Place 2 sprays into both nostrils 2 (two) times daily. 11.1 mL 11   furosemide (LASIX) 40 MG tablet Take 40 mg by mouth daily as needed.     glucosamine-chondroitin 500-400 MG tablet Take 1 tablet by mouth 3 (three) times daily.     HYDROcodone-acetaminophen (NORCO/VICODIN) 5-325 MG tablet Take 1 tablet by mouth every 4 (four) hours as needed.     Insulin Glargine (BASAGLAR KWIKPEN) 100 UNIT/ML Inject 14 Units into the skin at bedtime.     Insulin Pen Needle (BD PEN NEEDLE NANO 2ND GEN) 32G X 4 MM MISC Use to monitor glucose twice daily 100 each 6   Lancets MISC Use up to 4 times daily to check blood sugars. 200 each 11   losartan (COZAAR) 100 MG tablet Take 100 mg by mouth daily.     Melatonin 5 MG CHEW Chew 5 mg by mouth at bedtime as needed.     NARCAN 4 MG/0.1ML LIQD  nasal spray kit      ondansetron (ZOFRAN-ODT) 4 MG disintegrating tablet Take by mouth.     potassium chloride (K-DUR) 10 MEQ tablet Take 10 mEq by mouth daily as needed.     rosuvastatin (CRESTOR) 10 MG tablet Take 1 tablet (10 mg total) by mouth daily. 90 tablet 3   sertraline (ZOLOFT) 50 MG tablet Take 1 tablet (50 mg total) by mouth daily. 30 tablet 0   Current Facility-Administered Medications  Medication Dose Route Frequency Provider Last Rate Last Admin   denosumab (PROLIA) injection 60 mg  60 mg Subcutaneous Q6 months Glori Luis, MD   60 mg at 04/05/21 1307   [START ON 10/09/2023] denosumab (PROLIA) injection 60 mg  60 mg Subcutaneous Q6 months Glori Luis, MD        Allergies  Allergen Reactions   Prilosec Otc [Omeprazole Magnesium] Other (See Comments)    Maybe cause of acute interstitial nephritis   Sucralfate Other (See Comments)    Maybe  cause of acute interstitial nephritis   Amoxicillin Diarrhea   Morphine And Codeine Other (See Comments)    Chest pains Chest pains    Family History  Problem Relation Age of Onset   Stroke Mother    Arthritis Mother    Aortic aneurysm Sister    Lung cancer Sister    Heart attack Father 102   Breast cancer Sister 67    Social History   Socioeconomic History   Marital status: Widowed    Spouse name: Not on file   Number of children: Not on file   Years of education: Not on file   Highest education level: High school graduate  Occupational History   Occupation: retired   Tobacco Use   Smoking status: Never   Smokeless tobacco: Never  Vaping Use   Vaping status: Never Used  Substance and Sexual Activity   Alcohol use: No   Drug use: No   Sexual activity: Not Currently    Partners: Male  Other Topics Concern   Not on file  Social History Narrative   widow   Social Drivers of Health   Financial Resource Strain: Low Risk  (02/13/2023)   Overall Financial Resource Strain (CARDIA)    Difficulty of Paying Living Expenses: Not hard at all  Food Insecurity: No Food Insecurity (02/13/2023)   Hunger Vital Sign    Worried About Running Out of Food in the Last Year: Never true    Ran Out of Food in the Last Year: Never true  Transportation Needs: No Transportation Needs (02/13/2023)   PRAPARE - Administrator, Civil Service (Medical): No    Lack of Transportation (Non-Medical): No  Physical Activity: Sufficiently Active (02/13/2023)   Exercise Vital Sign    Days of Exercise per Week: 2 days    Minutes of Exercise per Session: 120 min  Stress: No Stress Concern Present (02/13/2023)   [employer] of Occupational Health - Occupational Stress Questionnaire    Feeling of Stress : Not at all  Social Connections: Moderately Integrated (02/13/2023)   Social Connection and Isolation Panel [NHANES]    Frequency of Communication with Friends and Family: More than three  times a week    Frequency of Social Gatherings with Friends and Family: Three times a week    Attends Religious Services: More than 4 times per year    Active Member of Clubs or Organizations: Yes    Attends Banker Meetings: More than  4 times per year    Marital Status: Widowed  Intimate Partner Violence: Not At Risk (02/13/2023)   Humiliation, Afraid, Rape, and Kick questionnaire    Fear of Current or Ex-Partner: No    Emotionally Abused: No    Physically Abused: No    Sexually Abused: No     Constitutional: Denies fever, malaise, fatigue, headache or abrupt weight changes.  Respiratory: Denies difficulty breathing, shortness of breath, cough or sputum production.   Cardiovascular: Denies chest pain, chest tightness, palpitations or swelling in the hands or feet.  Gastrointestinal: Denies abdominal pain, bloating, constipation, diarrhea or blood in the stool.  GU: Pt reports burning with urination, urinary retention. Denies urgency, frequency, burning sensation, blood in urine, odor or discharge. Skin: Pt reports multiple ant bites. Denies rashes or ulcercations.   No other specific complaints in a complete review of systems (except as listed in HPI above).      Objective:   Physical Exam BP 130/62 (BP Location: Left Arm, Patient Position: Sitting, Cuff Size: Normal)   Ht 5\' 3"  (1.6 m)   Wt 132 lb 10.4 oz (60.2 kg)   BMI 23.50 kg/m   Wt Readings from Last 3 Encounters:  06/22/23 134 lb 2 oz (60.8 kg)  04/27/23 137 lb 9.6 oz (62.4 kg)  04/21/23 136 lb 3.2 oz (61.8 kg)    General: Appears her stated age, well developed, well nourished in NAD. Skin: Scattered insect bites noted to bilateral forearms without infection. Cardiovascular: Normal rate Pulmonary/Chest: Normal effort. Abdomen: No CVA tenderness noted. Neurological: Alert and oriented.  BMET    Component Value Date/Time   NA 142 05/01/2023 1425   NA 137 01/18/2013 1036   K 4.5 05/01/2023 1425    K 3.9 01/18/2013 1036   CL 107 (H) 05/01/2023 1425   CL 103 01/18/2013 1036   CO2 21 05/01/2023 1425   CO2 26 01/18/2013 1036   GLUCOSE 347 (H) 05/01/2023 1425   GLUCOSE 296 (H) 04/21/2023 1324   GLUCOSE 342 (H) 01/18/2013 1036   BUN 22 05/01/2023 1425   BUN 14 01/18/2013 1036   CREATININE 1.67 (H) 05/01/2023 1425   CREATININE 1.48 (H) 01/18/2013 1036   CALCIUM 8.8 05/01/2023 1425   CALCIUM 9.3 01/18/2013 1036   GFRNONAA 29 (L) 04/26/2022 1823   GFRNONAA 35 (L) 01/18/2013 1036   GFRAA 19 (L) 10/20/2019 1735   GFRAA 41 (L) 01/18/2013 1036    Lipid Panel     Component Value Date/Time   CHOL 138 07/07/2022 0751   TRIG 109.0 07/07/2022 0751   HDL 42.20 07/07/2022 0751   CHOLHDL 3 07/07/2022 0751   VLDL 21.8 07/07/2022 0751   LDLCALC 74 07/07/2022 0751    CBC    Component Value Date/Time   WBC 7.9 04/26/2022 1823   RBC 4.86 04/26/2022 1823   HGB 14.7 04/26/2022 1823   HGB 13.1 01/18/2013 1036   HCT 46.3 (H) 04/26/2022 1823   HCT 38.6 01/18/2013 1036   PLT 194 04/26/2022 1823   PLT 170 01/18/2013 1036   MCV 95.3 04/26/2022 1823   MCV 92 01/18/2013 1036   MCH 30.2 04/26/2022 1823   MCHC 31.7 04/26/2022 1823   RDW 12.9 04/26/2022 1823   RDW 14.1 01/18/2013 1036   LYMPHSABS 1.2 04/26/2022 1823   MONOABS 0.4 04/26/2022 1823   EOSABS 0.0 04/26/2022 1823   BASOSABS 0.1 04/26/2022 1823    Hgb A1C Lab Results  Component Value Date   HGBA1C 9.6 (H) 04/21/2023  Assessment & Plan:   Assessment and Plan    Urinary tract infection (UTI) Dysuria and decreased urine output with urinalysis showing leukocytes and hematuria suggest UTI. Allergic to amoxicillin, Macrobid chosen. - Prescribe Macrobid 100 mg twice daily for five days. - Send urine culture for confirmation. - Advise follow-up with primary care provider if symptoms do not improve.  Fire ant bites Pruritus from fire ant bites, no infection. Current treatments ineffective, steroid cream  recommended. - Prescribe fluocinonide (Lidex) cream to apply to affected areas four times daily. - Recommend taking diphenhydramine every eight hours if not causing drowsiness.       Follow-up with your PCP as previously scheduled Nicki Reaper, NP

## 2023-07-23 LAB — URINE CULTURE
MICRO NUMBER:: 16203762
SPECIMEN QUALITY:: ADEQUATE

## 2023-07-24 ENCOUNTER — Encounter: Payer: Self-pay | Admitting: Internal Medicine

## 2023-07-24 MED ORDER — SULFAMETHOXAZOLE-TRIMETHOPRIM 400-80 MG PO TABS: 1.0000 | ORAL_TABLET | Freq: Two times a day (BID) | ORAL | 0 refills | Status: DC

## 2023-07-24 NOTE — Addendum Note (Signed)
 Addended by: Lorre Munroe on: 07/24/2023 08:04 AM   Modules accepted: Orders

## 2023-08-02 ENCOUNTER — Ambulatory Visit (INDEPENDENT_AMBULATORY_CARE_PROVIDER_SITE_OTHER): Payer: Medicare Other | Admitting: Nurse Practitioner

## 2023-08-02 ENCOUNTER — Encounter: Payer: Self-pay | Admitting: Nurse Practitioner

## 2023-08-02 VITALS — BP 120/70 | HR 66 | Ht 63.0 in | Wt 137.6 lb

## 2023-08-02 DIAGNOSIS — I10 Essential (primary) hypertension: Secondary | ICD-10-CM | POA: Diagnosis not present

## 2023-08-02 DIAGNOSIS — E1122 Type 2 diabetes mellitus with diabetic chronic kidney disease: Secondary | ICD-10-CM

## 2023-08-02 DIAGNOSIS — N1832 Chronic kidney disease, stage 3b: Secondary | ICD-10-CM | POA: Diagnosis not present

## 2023-08-02 DIAGNOSIS — Z794 Long term (current) use of insulin: Secondary | ICD-10-CM

## 2023-08-02 DIAGNOSIS — E782 Mixed hyperlipidemia: Secondary | ICD-10-CM | POA: Diagnosis not present

## 2023-08-02 LAB — POCT GLYCOSYLATED HEMOGLOBIN (HGB A1C): Hemoglobin A1C: 9.8 % — AB (ref 4.0–5.6)

## 2023-08-02 NOTE — Progress Notes (Signed)
 Endocrinology Follow Up Note       08/02/2023, 4:25 PM   Subjective:    Patient ID: Kelly [name], female    DOB: 05-15-1940.  Kelly [name] is being seen in follow up after being seen in consultation for management of currently uncontrolled symptomatic diabetes requested by  Dana Allan, MD.   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     Past Surgical History:  Procedure Laterality Date   APPENDECTOMY     BREAST BIOPSY Right 06/19/2018   affirm stereo/x clip/COLUMNAR CELL CHANGE WITH MICROCALCIFICATIONS. FIBROCYSTIC CHANGES WITH MICROCALCIFICATIONS   cataract  03/2017   CHOLECYSTECTOMY     COLONOSCOPY     COLONOSCOPY WITH PROPOFOL N/A 08/22/2017   Procedure: COLONOSCOPY WITH PROPOFOL;  Surgeon: Wyline Mood, MD;  Location: [facility] ENDOSCOPY;  Service: Gastroenterology;  Laterality: N/A;   ESOPHAGOGASTRODUODENOSCOPY     ESOPHAGOGASTRODUODENOSCOPY (EGD) WITH PROPOFOL N/A 03/23/2016   Procedure: ESOPHAGOGASTRODUODENOSCOPY (EGD) WITH PROPOFOL;  Surgeon: Scot Jun, MD;  Location: [facility] ENDOSCOPY;  Service: Endoscopy;  Laterality: N/A;   FLEXIBLE SIGMOIDOSCOPY N/A 02/06/2018   Procedure: FLEXIBLE SIGMOIDOSCOPY;  Surgeon: Wyline Mood, MD;  Location: [facility] ENDOSCOPY;  Service: Gastroenterology;  Laterality: N/A;   FLEXIBLE SIGMOIDOSCOPY N/A 12/03/2018   Procedure: FLEXIBLE SIGMOIDOSCOPY;  Surgeon: Wyline Mood, MD;  Location: [facility] ENDOSCOPY;  Service: Gastroenterology;  Laterality: N/A;   RECTAL PROLAPSE REPAIR  2012   x2   skin cancer removed from hand  2024    Social History   Socioeconomic History   Marital status: Widowed     Spouse name: Not on file   Number of children: Not on file   Years of education: Not on file   Highest education level: High school graduate  Occupational History   Occupation: retired   Tobacco Use   Smoking status: Never   Smokeless tobacco: Never  Vaping Use   Vaping status: Never Used  Substance and Sexual Activity   Alcohol use: No   Drug use: No   Sexual activity: Not Currently    Partners: Male  Other Topics Concern   Not on file  Social History Narrative   widow   Social Drivers of Health   Financial Resource Strain: Low Risk  (02/13/2023)   Overall Financial Resource Strain (CARDIA)    Difficulty of Paying Living Expenses: Not hard at all  Food Insecurity: No Food Insecurity (02/13/2023)   Hunger Vital Sign    Worried About Running Out of Food in the Last Year: Never true    Ran Out of Food in the Last Year: Never true  Transportation Needs: No Transportation Needs (02/13/2023)   PRAPARE - Administrator, Civil Service (Medical): No    Lack of Transportation (Non-Medical): No  Physical Activity: Sufficiently Active (02/13/2023)   Exercise Vital Sign  Days of Exercise per Week: 2 days    Minutes of Exercise per Session: 120 min  Stress: No Stress Concern Present (02/13/2023)   [employer] of Occupational Health - Occupational Stress Questionnaire    Feeling of Stress : Not at all  Social Connections: Moderately Integrated (02/13/2023)   Social Connection and Isolation Panel [NHANES]    Frequency of Communication with Friends and Family: More than three times a week    Frequency of Social Gatherings with Friends and Family: Three times a week    Attends Religious Services: More than 4 times per year    Active Member of Clubs or Organizations: Yes    Attends Banker Meetings: More than 4 times per year    Marital Status: Widowed    Family History  Problem Relation Age of Onset   Stroke Mother    Arthritis Mother    Aortic  aneurysm Sister    Lung cancer Sister    Heart attack Father 17   Breast cancer Sister 30    Outpatient Encounter Medications as of 08/02/2023  Medication Sig   amLODipine (NORVASC) 10 MG tablet Take 1 tablet (10 mg total) by mouth daily.   aspirin EC 81 MG tablet Take 81 mg by mouth daily.   calcitRIOL (ROCALTROL) 0.25 MCG capsule Take 0.25 mcg by mouth daily.   Calcium Citrate-Vitamin D 315-5 MG-MCG TABS Take 1 tablet by mouth daily.   Cholecalciferol (VITAMIN D) 50 MCG (2000 UT) CAPS Take 2,000 Units by mouth daily.    Continuous Glucose Sensor (FREESTYLE LIBRE 2 SENSOR) MISC Use to check glucose 4 times daily   diclofenac (FLECTOR) 1.3 % PTCH 1 patch 2 (two) times daily.   fluocinonide-emollient (LIDEX-E) 0.05 % cream Apply 1 Application topically 4 (four) times daily.   furosemide (LASIX) 40 MG tablet Take 40 mg by mouth daily as needed.   glucosamine-chondroitin 500-400 MG tablet Take 1 tablet by mouth 3 (three) times daily.   HYDROcodone-acetaminophen (NORCO/VICODIN) 5-325 MG tablet Take 1 tablet by mouth every 4 (four) hours as needed.   Insulin Glargine (BASAGLAR KWIKPEN) 100 UNIT/ML Inject 14 Units into the skin at bedtime.   Insulin Pen Needle (BD PEN NEEDLE NANO 2ND GEN) 32G X 4 MM MISC Use to monitor glucose twice daily   Lancets MISC Use up to 4 times daily to check blood sugars.   losartan (COZAAR) 100 MG tablet Take 100 mg by mouth daily.   Melatonin 5 MG CHEW Chew 5 mg by mouth at bedtime as needed.   NARCAN 4 MG/0.1ML LIQD nasal spray kit    potassium chloride (K-DUR) 10 MEQ tablet Take 10 mEq by mouth daily as needed.   rosuvastatin (CRESTOR) 10 MG tablet Take 1 tablet (10 mg total) by mouth daily.   sertraline (ZOLOFT) 50 MG tablet Take 1 tablet (50 mg total) by mouth daily.   azelastine (ASTELIN) 0.1 % nasal spray Place 2 sprays into both nostrils 2 (two) times daily. Use in each nostril as directed (Patient not taking: Reported on 08/02/2023)   fluticasone (FLONASE)  50 MCG/ACT nasal spray Place 2 sprays into both nostrils 2 (two) times daily. (Patient not taking: Reported on 08/02/2023)   nitrofurantoin, macrocrystal-monohydrate, (MACROBID) 100 MG capsule Take 1 capsule (100 mg total) by mouth 2 (two) times daily. (Patient not taking: Reported on 08/02/2023)   ondansetron (ZOFRAN-ODT) 4 MG disintegrating tablet Take by mouth. (Patient not taking: Reported on 08/02/2023)   sulfamethoxazole-trimethoprim (BACTRIM) 400-80 MG tablet Take  1 tablet by mouth 2 (two) times daily. (Patient not taking: Reported on 08/02/2023)   Facility-Administered Encounter Medications as of 08/02/2023  Medication   denosumab (PROLIA) injection 60 mg   [START ON 10/09/2023] denosumab (PROLIA) injection 60 mg    ALLERGIES: Allergies  Allergen Reactions   Prilosec Otc [Omeprazole Magnesium] Other (See Comments)    Maybe cause of acute interstitial nephritis   Sucralfate Other (See Comments)    Maybe cause of acute interstitial nephritis   Amoxicillin Diarrhea   Morphine And Codeine Other (See Comments)    Chest pains Chest pains    VACCINATION STATUS: Immunization History  Administered Date(s) Administered   Fluad Quad(high Dose 65+) 02/25/2019, 03/02/2020, 05/17/2021, 04/08/2022   Influenza, High Dose Seasonal PF 02/24/2017, 02/28/2018   Influenza,inj,quad, With Preservative 02/19/2016   Moderna Sars-Covid-2 Vaccination 07/05/2019, 08/12/2019, 03/02/2020, 08/10/2020   PNEUMOCOCCAL CONJUGATE-20 07/02/2021   Rsv, Bivalent, Protein Subunit Rsvpref,pf Verdis Frederickson) 05/08/2023   Td 02/26/2019   Zoster Recombinant(Shingrix) 01/02/2020, 03/10/2020    Diabetes She presents for her follow-up diabetic visit. She has type 2 diabetes mellitus. Onset time: diagnosed at approx age of 65. Her disease course has been fluctuating. There are no hypoglycemic associated symptoms. There are no hypoglycemic complications. Diabetic complications include nephropathy. (gastroparesis) Risk factors for  coronary artery disease include diabetes mellitus and post-menopausal. Current diabetic treatment includes insulin injections and diet. She is compliant with treatment most of the time. Her weight is fluctuating minimally. She is following a generally unhealthy diet. When asked about meal planning, she reported none. She has not had a previous visit with a dietitian. She rarely participates in exercise. Her home blood glucose trend is fluctuating dramatically. Her overall blood glucose range is >200 mg/dl. (She presents today with her CGM showing fluctuating glycemic profile overall.  Her POCT A1c today is 9.8%, increasing slightly from last visit of 9.6%.   Analysis of her CGM shows TIR 31%, TAR 69%, TBR 0% with a GMI of 8.6%.  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  She also notes her boyfriend is staying with her after a recent surgery and she is not sleeping well.) An ACE inhibitor/angiotensin II receptor blocker is being taken. She does not see a podiatrist.Eye exam is current.    Review of systems  Constitutional: + Minimally fluctuating body weight,  current Body mass index is 24.37 kg/m. , no fatigue, no subjective hyperthermia, no subjective hypothermia Eyes: no blurry vision, no xerophthalmia ENT: no sore throat, no nodules palpated in throat, no dysphagia/odynophagia, no hoarseness Cardiovascular: no chest pain, no shortness of breath, no palpitations, no leg swelling Respiratory: no cough, no shortness of breath Gastrointestinal: no nausea/vomiting/diarrhea Musculoskeletal: no muscle/joint aches Skin: no rashes, no hyperemia Neurological: no tremors, no numbness, no tingling, no dizziness Psychiatric: no depression, no anxiety  Objective:     BP 120/70 (BP Location: Right Arm, Patient Position: Sitting, Cuff Size: Large)   Pulse 66   Ht 5\' 3"  (1.6 m)   Wt 137 lb 9.6 oz (62.4 kg)   BMI 24.37 kg/m   Wt Readings from Last 3 Encounters:  08/02/23 137 lb 9.6 oz  (62.4 kg)  07/21/23 132 lb 10.4 oz (60.2 kg)  06/22/23 134 lb 2 oz (60.8 kg)     BP Readings from Last 3 Encounters:  08/02/23 120/70  07/21/23 130/62  06/22/23 130/64       Physical Exam- Limited  Constitutional:  Body mass index is 24.37 kg/m. , not in  acute distress, normal state of mind Eyes:  EOMI, no exophthalmos Musculoskeletal: no gross deformities, strength intact in all four extremities, no gross restriction of joint movements Skin:  no rashes, no hyperemia Neurological: no tremor with outstretched hands   Diabetic Foot Exam - Simple   No data filed     CMP ( most recent) CMP     Component Value Date/Time   NA 142 05/01/2023 1425   NA 137 01/18/2013 1036   K 4.5 05/01/2023 1425   K 3.9 01/18/2013 1036   CL 107 (H) 05/01/2023 1425   CL 103 01/18/2013 1036   CO2 21 05/01/2023 1425   CO2 26 01/18/2013 1036   GLUCOSE 347 (H) 05/01/2023 1425   GLUCOSE 296 (H) 04/21/2023 1324   GLUCOSE 342 (H) 01/18/2013 1036   BUN 22 05/01/2023 1425   BUN 14 01/18/2013 1036   CREATININE 1.67 (H) 05/01/2023 1425   CREATININE 1.48 (H) 01/18/2013 1036   CALCIUM 8.8 05/01/2023 1425   CALCIUM 9.3 01/18/2013 1036   PROT 6.5 04/21/2023 1324   PROT 6.2 (L) 01/18/2013 1036   ALBUMIN 3.8 04/21/2023 1324   ALBUMIN 3.3 (L) 01/18/2013 1036   AST 23 04/21/2023 1324   AST 22 01/18/2013 1036   ALT 25 04/21/2023 1324   ALT 46 01/18/2013 1036   ALKPHOS 96 04/21/2023 1324   ALKPHOS 120 01/18/2013 1036   BILITOT 0.7 04/21/2023 1324   BILITOT 0.6 01/18/2013 1036   GFRNONAA 29 (L) 04/26/2022 1823   GFRNONAA 35 (L) 01/18/2013 1036   GFRAA 19 (L) 10/20/2019 1735   GFRAA 41 (L) 01/18/2013 1036     Diabetic Labs (most recent): Lab Results  Component Value Date   HGBA1C 9.8 (A) 08/02/2023   HGBA1C 9.6 (H) 04/21/2023   HGBA1C 10.0 (H) 01/06/2023   MICROALBUR 97.2 (H) 04/21/2023   MICROALBUR 4.5 (H) 08/02/2017     Lipid Panel ( most recent) Lipid Panel     Component Value  Date/Time   CHOL 138 07/07/2022 0751   TRIG 109.0 07/07/2022 0751   HDL 42.20 07/07/2022 0751   CHOLHDL 3 07/07/2022 0751   VLDL 21.8 07/07/2022 0751   LDLCALC 74 07/07/2022 0751   LDLDIRECT 93.0 04/21/2023 1324      Lab Results  Component Value Date   TSH 4.41 11/08/2021   TSH 3.93 03/22/2017   TSH 0.719 07/09/2016           Assessment & Plan:   1) Type 2 diabetes mellitus with stage 3b chronic kidney disease, with long-term current use of insulin (HCC)  She presents today with her CGM showing fluctuating glycemic profile overall.  Her POCT A1c today is 9.8%, increasing slightly from last visit of 9.6%.   Analysis of her CGM shows TIR 31%, TAR 69%, TBR 0% with a GMI of 8.6%.  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  She also notes her boyfriend is staying with her after a recent surgery and she is not sleeping well.  - Kelly [name] has currently uncontrolled symptomatic type 2 DM since 83 years of age.   -Recent labs reviewed.  - I had a long discussion with her about the progressive nature of diabetes and the pathology behind its complications. 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 These are all discussed in detail with her.  The  following Lifestyle Medicine recommendations according to American College of Lifestyle Medicine [facility]) were discussed and offered to patient and she agrees to start the journey:  A. Whole Foods, Plant-based plate comprising of fruits and vegetables, plant-based proteins, whole-grain carbohydrates was discussed in detail with the patient.   A list for source of those nutrients were also provided to the patient.  Patient will use only water or unsweetened tea for hydration. B.  The need to stay away from risky substances including alcohol, smoking; obtaining 7 to 9 hours of  restorative sleep, at least 150 minutes of moderate intensity exercise weekly, the importance of healthy social connections,  and stress reduction techniques were discussed. C.  A full color page of  Calorie density of various food groups per pound showing examples of each food groups was provided to the patient.  - Nutritional counseling repeated at each appointment due to patients tendency to fall back in to old habits.  - The patient admits there is a room for improvement in their diet and drink choices. -  Suggestion is made for the patient to avoid simple carbohydrates from their diet including Cakes, Sweet Desserts / Pastries, Ice Cream, Soda (diet and regular), Sweet Tea, Candies, Chips, Cookies, Sweet Pastries, Store Bought Juices, Alcohol in Excess of 1-2 drinks a day, Artificial Sweeteners, Coffee Creamer, and "Sugar-free" Products. This will help patient to have stable blood glucose profile and potentially avoid unintended weight gain.   - I encouraged the patient to switch to unprocessed or minimally processed complex starch and increased protein intake (animal or plant source), fruits, and vegetables.   - Patient is advised to stick to a routine mealtimes to eat 3 meals a day and avoid unnecessary snacks (to snack only to correct hypoglycemia).  - I have approached her with the following individualized plan to manage her diabetes and patient agrees:   -Avoiding hypoglycemia is a top priority in her care.  A good goal A1c for her would be 7%.  -She is advised to continue her Basaglar 14 units SQ daily at bedtime.  We discussed the importance of restarting her dietary regimen, avoiding concentrated sweets.  We discussed the importance of avoiding sugary drinks so that she does not need to escalate her treatment plan.  -she is encouraged to continue monitoring glucose at least 2 times daily (using her CGM), before breakfast and before bed, and to call the clinic if she has readings less  than 70 or above 300 for 3 tests in a row.  - she is warned not to take insulin without proper monitoring per orders. - Adjustment parameters are given to her for hypo and hyperglycemia in writing.  -She is not an ideal candidate for incretin therapy due to body habitus with BMI of 22.  - Specific targets for  A1c; LDL, HDL, and Triglycerides were discussed with the patient.  2) Blood Pressure /Hypertension:  her blood pressure is controlled to target for her age.   she is advised to continue her current medications including Losartan 50 mg p.o. daily with breakfast, and Norvasc 10 mg po daily.  She also takes Lasix 40 po daily as needed for fluid.  She does see nephrology, will defer any med changes to them.  3) Lipids/Hyperlipidemia:    Review of her recent lipid panel from 07/07/22 showed controlled LDL at 74 .  she is advised to continue Crestor 10 mg daily at bedtime.  Side effects and precautions discussed with her.  4)  Weight/Diet:  her Body mass index is 24.37 kg/m.  -    she is NOT a candidate for weight loss.   Exercise, and detailed carbohydrates information provided  -  detailed on discharge instructions.  5) Chronic Care/Health Maintenance: -she is on ACEI/ARB and Statin medications and is encouraged to initiate and continue to follow up with Ophthalmology, Dentist, Podiatrist at least yearly or according to recommendations, and advised to stay away from smoking. I have recommended yearly flu vaccine and pneumonia vaccine at least every 5 years; moderate intensity exercise for up to 150 minutes weekly; and sleep for at least 7 hours a day.  - she is advised to maintain close follow up with Dana Allan, MD for primary care needs, as well as her other providers for optimal and coordinated care.     I spent  32  minutes in the care of the patient today including review of labs from CMP, Lipids, Thyroid Function, Hematology (current and previous including abstractions from other  facilities); face-to-face time discussing  her blood glucose readings/logs, discussing hypoglycemia and hyperglycemia episodes and symptoms, medications doses, her options of short and long term treatment based on the latest standards of care / guidelines;  discussion about incorporating lifestyle medicine;  and documenting the encounter. Risk reduction counseling performed per USPSTF guidelines to reduce obesity and cardiovascular risk factors.     Please refer to Patient Instructions for Blood Glucose Monitoring and Insulin/Medications Dosing Guide"  in media tab for additional information. Please  also refer to " Patient Self Inventory" in the Media  tab for reviewed elements of pertinent patient history.  Kelly [name] participated in the discussions, expressed understanding, and voiced agreement with the above plans.  All questions were answered to her satisfaction. she is encouraged to contact clinic should she have any questions or concerns prior to her return visit.     Follow up plan: - Return in about 3 months (around 11/02/2023) for Diabetes F/U with A1c in office, No previsit labs, Bring meter and logs.  Ronny Bacon, [facility] [address] [place], [place] [address] Phone: [phone] Fax: [phone]  08/02/2023, 4:25 PM

## 2023-08-15 DIAGNOSIS — Z85828 Personal history of other malignant neoplasm of skin: Secondary | ICD-10-CM | POA: Diagnosis not present

## 2023-08-15 DIAGNOSIS — L570 Actinic keratosis: Secondary | ICD-10-CM | POA: Diagnosis not present

## 2023-08-15 DIAGNOSIS — D2261 Melanocytic nevi of right upper limb, including shoulder: Secondary | ICD-10-CM | POA: Diagnosis not present

## 2023-08-15 DIAGNOSIS — D225 Melanocytic nevi of trunk: Secondary | ICD-10-CM | POA: Diagnosis not present

## 2023-08-15 DIAGNOSIS — D2272 Melanocytic nevi of left lower limb, including hip: Secondary | ICD-10-CM | POA: Diagnosis not present

## 2023-08-15 DIAGNOSIS — D2262 Melanocytic nevi of left upper limb, including shoulder: Secondary | ICD-10-CM | POA: Diagnosis not present

## 2023-08-15 DIAGNOSIS — Z08 Encounter for follow-up examination after completed treatment for malignant neoplasm: Secondary | ICD-10-CM | POA: Diagnosis not present

## 2023-08-15 DIAGNOSIS — L821 Other seborrheic keratosis: Secondary | ICD-10-CM | POA: Diagnosis not present

## 2023-08-16 DIAGNOSIS — M7062 Trochanteric bursitis, left hip: Secondary | ICD-10-CM | POA: Diagnosis not present

## 2023-08-16 DIAGNOSIS — E1122 Type 2 diabetes mellitus with diabetic chronic kidney disease: Secondary | ICD-10-CM | POA: Diagnosis not present

## 2023-08-16 DIAGNOSIS — M546 Pain in thoracic spine: Secondary | ICD-10-CM | POA: Diagnosis not present

## 2023-08-16 DIAGNOSIS — M533 Sacrococcygeal disorders, not elsewhere classified: Secondary | ICD-10-CM | POA: Diagnosis not present

## 2023-08-16 DIAGNOSIS — M810 Age-related osteoporosis without current pathological fracture: Secondary | ICD-10-CM | POA: Diagnosis not present

## 2023-08-16 DIAGNOSIS — M25572 Pain in left ankle and joints of left foot: Secondary | ICD-10-CM | POA: Diagnosis not present

## 2023-08-16 DIAGNOSIS — M5032 Other cervical disc degeneration, mid-cervical region, unspecified level: Secondary | ICD-10-CM | POA: Diagnosis not present

## 2023-08-16 DIAGNOSIS — M461 Sacroiliitis, not elsewhere classified: Secondary | ICD-10-CM | POA: Diagnosis not present

## 2023-08-16 DIAGNOSIS — Z79899 Other long term (current) drug therapy: Secondary | ICD-10-CM | POA: Diagnosis not present

## 2023-08-16 DIAGNOSIS — T2101XA Burn of unspecified degree of chest wall, initial encounter: Secondary | ICD-10-CM | POA: Diagnosis not present

## 2023-08-16 DIAGNOSIS — E1165 Type 2 diabetes mellitus with hyperglycemia: Secondary | ICD-10-CM | POA: Diagnosis not present

## 2023-08-16 DIAGNOSIS — I10 Essential (primary) hypertension: Secondary | ICD-10-CM | POA: Diagnosis not present

## 2023-08-16 DIAGNOSIS — N1832 Chronic kidney disease, stage 3b: Secondary | ICD-10-CM | POA: Diagnosis not present

## 2023-08-16 DIAGNOSIS — M47812 Spondylosis without myelopathy or radiculopathy, cervical region: Secondary | ICD-10-CM | POA: Diagnosis not present

## 2023-08-16 DIAGNOSIS — N2581 Secondary hyperparathyroidism of renal origin: Secondary | ICD-10-CM | POA: Diagnosis not present

## 2023-08-16 DIAGNOSIS — Q762 Congenital spondylolisthesis: Secondary | ICD-10-CM | POA: Diagnosis not present

## 2023-08-19 NOTE — Progress Notes (Signed)
 [facility] [address] [place], [place] [address]  Pulmonary Sleep Medicine   Office Visit Note  Patient Name: Kelly [name] DOB: 10-09-1940 MRN [id number]    Chief Complaint: Obstructive Sleep Apnea visit  Brief History:  Kelly [name] is seen today for annual follow up on CPAP @ 4cmH20. The patient has a 15 year history of sleep apnea. Patient is not using PAP nightly.  The patient feels rested after sleeping with PAP.  The patient reports benefiting from PAP use. Reported sleepiness is  improved and the Epworth Sleepiness Score is 2 out of 24. The patient does not take naps. The patient complains of the following: none.  The compliance download shows 23% compliance with an average use time of 5 hours 11 minutes. The AHI is 0.8.  The patient does complain of limb movements disrupting sleep. The patient continues to require PAP therapy in order to eliminate sleep apnea.  ROS  General: (-) fever, (-) chills, (-) night sweat Nose and Sinuses: (-) nasal stuffiness or itchiness, (-) postnasal drip, (-) nosebleeds, (-) sinus trouble. Mouth and Throat: (-) sore throat, (-) hoarseness. Neck: (-) swollen glands, (-) enlarged thyroid, (-) neck pain. Respiratory: - cough, - shortness of breath, - wheezing. Neurologic: - numbness, - tingling. Psychiatric: + anxiety, - depression   Current Medication: Outpatient Encounter Medications as of 08/21/2023  Medication Sig Note   amLODipine (NORVASC) 10 MG tablet Take 1 tablet (10 mg total) by mouth daily.    aspirin EC 81 MG tablet Take 81 mg by mouth daily.    azelastine (ASTELIN) 0.1 % nasal spray Place 2 sprays into both nostrils 2 (two) times daily. Use in each nostril as directed (Patient not taking: Reported on 08/02/2023)    calcitRIOL (ROCALTROL) 0.25 MCG capsule Take 0.25 mcg by mouth daily.    Calcium Citrate-Vitamin D 315-5 MG-MCG TABS Take 1 tablet by mouth daily.    Cholecalciferol (VITAMIN D) 50 MCG (2000 UT)  CAPS Take 2,000 Units by mouth daily.     Continuous Glucose Sensor (FREESTYLE LIBRE 2 SENSOR) MISC Use to check glucose 4 times daily    diclofenac (FLECTOR) 1.3 % PTCH 1 patch 2 (two) times daily. 02/13/2023: As needed   fluocinonide-emollient (LIDEX-E) 0.05 % cream Apply 1 Application topically 4 (four) times daily.    fluticasone (FLONASE) 50 MCG/ACT nasal spray Place 2 sprays into both nostrils 2 (two) times daily. (Patient not taking: Reported on 08/02/2023)    furosemide (LASIX) 40 MG tablet Take 40 mg by mouth daily as needed.    glucosamine-chondroitin 500-400 MG tablet Take 1 tablet by mouth 3 (three) times daily. 06/16/2020: Reports taking once a day   HYDROcodone-acetaminophen (NORCO/VICODIN) 5-325 MG tablet Take 1 tablet by mouth every 4 (four) hours as needed.    Insulin Glargine (BASAGLAR KWIKPEN) 100 UNIT/ML Inject 14 Units into the skin at bedtime.    Insulin Pen Needle (BD PEN NEEDLE NANO 2ND GEN) 32G X 4 MM MISC Use to monitor glucose twice daily    Lancets MISC Use up to 4 times daily to check blood sugars.    losartan (COZAAR) 100 MG tablet Take 100 mg by mouth daily.    Melatonin 5 MG CHEW Chew 5 mg by mouth at bedtime as needed.    NARCAN 4 MG/0.1ML LIQD nasal spray kit     ondansetron (ZOFRAN-ODT) 4 MG disintegrating tablet Take by mouth. (Patient not taking: Reported on 08/02/2023)    potassium chloride (K-DUR) 10 MEQ tablet  Take 10 mEq by mouth daily as needed.    rosuvastatin (CRESTOR) 10 MG tablet Take 1 tablet (10 mg total) by mouth daily.    sertraline (ZOLOFT) 50 MG tablet Take 1 tablet (50 mg total) by mouth daily.    [DISCONTINUED] nitrofurantoin, macrocrystal-monohydrate, (MACROBID) 100 MG capsule Take 1 capsule (100 mg total) by mouth 2 (two) times daily. (Patient not taking: Reported on 08/02/2023)    [DISCONTINUED] sulfamethoxazole-trimethoprim (BACTRIM) 400-80 MG tablet Take 1 tablet by mouth 2 (two) times daily. (Patient not taking: Reported on 08/02/2023)     Facility-Administered Encounter Medications as of 08/21/2023  Medication   denosumab (PROLIA) injection 60 mg   [START ON 10/09/2023] denosumab (PROLIA) injection 60 mg    Surgical History: Past Surgical History:  Procedure Laterality Date   APPENDECTOMY     BREAST BIOPSY Right 06/19/2018   affirm stereo/x clip/COLUMNAR CELL CHANGE WITH MICROCALCIFICATIONS. FIBROCYSTIC CHANGES WITH MICROCALCIFICATIONS   cataract  03/2017   CHOLECYSTECTOMY     COLONOSCOPY     COLONOSCOPY WITH PROPOFOL N/A 08/22/2017   Procedure: COLONOSCOPY WITH PROPOFOL;  Surgeon: Luke Salaam, MD;  Location: [facility] ENDOSCOPY;  Service: Gastroenterology;  Laterality: N/A;   ESOPHAGOGASTRODUODENOSCOPY     ESOPHAGOGASTRODUODENOSCOPY (EGD) WITH PROPOFOL N/A 03/23/2016   Procedure: ESOPHAGOGASTRODUODENOSCOPY (EGD) WITH PROPOFOL;  Surgeon: Cassie Click, MD;  Location: [facility] ENDOSCOPY;  Service: Endoscopy;  Laterality: N/A;   FLEXIBLE SIGMOIDOSCOPY N/A 02/06/2018   Procedure: FLEXIBLE SIGMOIDOSCOPY;  Surgeon: Luke Salaam, MD;  Location: [facility] ENDOSCOPY;  Service: Gastroenterology;  Laterality: N/A;   FLEXIBLE SIGMOIDOSCOPY N/A 12/03/2018   Procedure: FLEXIBLE SIGMOIDOSCOPY;  Surgeon: Luke Salaam, MD;  Location: [facility] ENDOSCOPY;  Service: Gastroenterology;  Laterality: N/A;   RECTAL PROLAPSE REPAIR  2012   x2   skin cancer removed from hand  2024    Medical History: Past Medical History:  Diagnosis Date   Colon polyps    Depression    DM (diabetes mellitus), type 2 with renal complications (HCC)    Hyperlipidemia    Hypertension    Kidney disease    Kidney stones    Shingles 12/05/2019   Sleep apnea    Squamous cell skin cancer 12/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     Family History: Non contributory to the present illness  Social History: Social History   Socioeconomic History   Marital status: Widowed    Spouse name: Not on file   Number of children: Not on file   Years of education: Not on file    Highest education level: High school graduate  Occupational History   Occupation: retired   Tobacco Use   Smoking status: Never   Smokeless tobacco: Never  Vaping Use   Vaping status: Never Used  Substance and Sexual Activity   Alcohol use: No   Drug use: No   Sexual activity: Not Currently    Partners: Male  Other Topics Concern   Not on file  Social History Narrative   widow   Social Drivers of Health   Financial Resource Strain: Low Risk  (02/13/2023)   Overall Financial Resource Strain (CARDIA)    Difficulty of Paying Living Expenses: Not hard at all  Food Insecurity: No Food Insecurity (02/13/2023)   Hunger Vital Sign    Worried About Running Out of Food in the Last Year: Never true    Ran Out of Food in the Last Year: Never true  Transportation Needs: No Transportation Needs (02/13/2023)   PRAPARE - Transportation  Lack of Transportation (Medical): No    Lack of Transportation (Non-Medical): No  Physical Activity: Sufficiently Active (02/13/2023)   Exercise Vital Sign    Days of Exercise per Week: 2 days    Minutes of Exercise per Session: 120 min  Stress: No Stress Concern Present (02/13/2023)   [employer] of Occupational Health - Occupational Stress Questionnaire    Feeling of Stress : Not at all  Social Connections: Moderately Integrated (02/13/2023)   Social Connection and Isolation Panel [NHANES]    Frequency of Communication with Friends and Family: More than three times a week    Frequency of Social Gatherings with Friends and Family: Three times a week    Attends Religious Services: More than 4 times per year    Active Member of Clubs or Organizations: Yes    Attends Banker Meetings: More than 4 times per year    Marital Status: Widowed  Intimate Partner Violence: Not At Risk (02/13/2023)   Humiliation, Afraid, Rape, and Kick questionnaire    Fear of Current or Ex-Partner: No    Emotionally Abused: No    Physically Abused: No     Sexually Abused: No    Vital Signs: Blood pressure (!) 140/64, pulse 84, resp. rate 16, height 5\' 3"  (1.6 m), weight 137 lb (62.1 kg), SpO2 98%. Body mass index is 24.27 kg/m.    Examination: General Appearance: The patient is well-developed, well-nourished, and in no distress. Neck Circumference: 33 cm Skin: Gross inspection of skin unremarkable. Head: normocephalic, no gross deformities. Eyes: no gross deformities noted. ENT: ears appear grossly normal Neurologic: Alert and oriented. No involuntary movements.  STOP BANG RISK ASSESSMENT S (snore) Have you been told that you snore?     YES   T (tired) Are you often tired, fatigued, or sleepy during the day?   NO  O (obstruction) Do you stop breathing, choke, or gasp during sleep? NO   P (pressure) Do you have or are you being treated for high blood pressure? YES   B (BMI) Is your body index greater than 35 kg/m? NO   A (age) Are you 96 years old or older? YES   N (neck) Do you have a neck circumference greater than 16 inches?   NO   G (gender) Are you a female? NO   TOTAL STOP/BANG "YES" ANSWERS 3       A STOP-Bang score of 2 or less is considered low risk, and a score of 5 or more is high risk for having either moderate or severe OSA. For people who score 3 or 4, doctors may need to perform further assessment to determine how likely they are to have OSA.         EPWORTH SLEEPINESS SCALE:  Scale:  (0)= no chance of dozing; (1)= slight chance of dozing; (2)= moderate chance of dozing; (3)= high chance of dozing  Chance  Situtation    Sitting and reading: 0    Watching TV: 0    Sitting Inactive in public: 0    As a passenger in car: 1      Lying down to rest: 1    Sitting and talking: 0    Sitting quielty after lunch: 0    In a car, stopped in traffic: 0   TOTAL SCORE:   2 out of 24    SLEEP STUDIES:  PSG - 03/14/2008 - AHI 25.3/hr, min Sp02 77% Titration - 03/27/2008 - CPAP @ 4cmH20  CPAP  COMPLIANCE DATA:  Date Range: 08/22/2022-08/21/2023  Average Daily Use: 5 hours 11 minutes  Median Use: 5 hours 1 minute  Compliance for > 4 Hours: 23%  AHI: 0.8 respiratory events per hour  Days Used: 130/365 days  Mask Leak: 9  95th Percentile Pressure: 4         LABS: Recent Results (from the past 2160 hours)  POCT URINE DIPSTICK     Status: Abnormal   Collection Time: 07/21/23  2:31 PM  Result Value Ref Range   Color, UA yellow yellow   Clarity, UA cloudy (A) clear   Glucose, UA negative negative mg/dL   Bilirubin, UA negative negative   Ketones, POC UA negative negative mg/dL   Spec Grav, UA 8.657 8.469 - 1.025   Blood, UA moderate (A) negative   pH, UA 6.0 5.0 - 8.0   POC PROTEIN,UA =100 (A) negative, trace   Urobilinogen, UA 0.2 0.2 or 1.0 E.U./dL   Nitrite, UA Negative Negative   Leukocytes, UA Small (1+) (A) Negative  Urine Culture     Status: Abnormal   Collection Time: 07/21/23  2:34 PM   Specimen: Urine  Result Value Ref Range   MICRO NUMBER: [id number]    SPECIMEN QUALITY: Adequate    Sample Source URINE    STATUS: FINAL    ISOLATE 1: ESBL Klebsiella pneumoniae (A)     Comment: 50,000-100,000 CFU/mL of Klebsiella pneumoniae (ESBL) ESBL RESULT:        The organism has been confirmed as an ESBL producer.      Susceptibility   Esbl klebsiella pneumoniae - URINE CULTURE, REFLEX    AMOX/CLAVULANIC 4 Sensitive     AMPICILLIN* >=32 Resistant      * Extended spectrum beta-lactamase (ESBL) producing organisms demonstrate decreased activity with penicillins, cephalosporins and aztreonam.     AMPICILLIN/SULBACTAM 4 Sensitive     CEFAZOLIN* >=64 Resistant      * Extended spectrum beta-lactamase (ESBL) producing organisms demonstrate decreased activity with penicillins, cephalosporins and aztreonam. For uncomplicated UTI caused by E. coli, K. pneumoniae or P. mirabilis: Cefazolin is susceptible if MIC <32 mcg/mL and predicts susceptible to the oral  agents cefaclor, cefdinir, cefpodoxime, cefprozil, cefuroxime, cephalexin and loracarbef.     CEFTAZIDIME <=1 Sensitive     CEFEPIME <=1 Sensitive     CEFTRIAXONE >=64 Resistant     CIPROFLOXACIN 1 Resistant     LEVOFLOXACIN 1 Intermediate     GENTAMICIN <=1 Sensitive     IMIPENEM <=0.25 Sensitive     NITROFURANTOIN 64 Intermediate     PIP/TAZO <=4 Sensitive     TOBRAMYCIN <=1 Sensitive     TRIMETH/SULFA* <=20 Sensitive      * Extended spectrum beta-lactamase (ESBL) producing organisms demonstrate decreased activity with penicillins, cephalosporins and aztreonam. For uncomplicated UTI caused by E. coli, K. pneumoniae or P. mirabilis: Cefazolin is susceptible if MIC <32 mcg/mL and predicts susceptible to the oral agents cefaclor, cefdinir, cefpodoxime, cefprozil, cefuroxime, cephalexin and loracarbef. Legend: S = Susceptible  I = Intermediate R = Resistant  NS = Not susceptible SDD = Susceptible Dose Dependent * = Not Tested  NR = Not Reported **NN = See Therapy Comments   HgB A1c     Status: Abnormal   Collection Time: 08/02/23  4:01 PM  Result Value Ref Range   Hemoglobin A1C 9.8 (A) 4.0 - 5.6 %   HbA1c POC (<> result, manual entry)     HbA1c, POC (prediabetic range)  HbA1c, POC (controlled diabetic range)      Radiology: DG Bone Density Result Date: 06/02/2023 EXAM: DUAL X-RAY ABSORPTIOMETRY (DXA) FOR BONE MINERAL DENSITY IMPRESSION: Your patient Denym Lahaie completed a BMD test on 06/02/2023 using the [employer] iDXA DXA System (software version: 14.10) manufactured by [employer]. The following summarizes the results of our evaluation. Technologist: SCE PATIENT BIOGRAPHICAL: Name: Charniece, Venturino Patient ID: [id number] Birth Date: 28-May-1940 Height: 63.0 in. Gender: Female Exam Date: 06/02/2023 Weight: 136.8 lbs. Indications: Advanced Age, Caucasian, Diabetic, Early Menopause, Height Loss, Kidney Disease, Postmenopausal Fractures: Treatments:  Calcium, Insulin, Prolia, Vitamin D DENSITOMETRY RESULTS: Site         Region     Measured Date Measured Age WHO Classification Young Adult T-score BMD         %Change vs. Previous Significant Change (*) DualFemur Neck Right 06/02/2023 82.0 Osteopenia -2.3 0.719 g/cm2 11.3% Yes DualFemur Neck Right 10[age]17/2019 78.3 Osteoporosis -2.8 0.646 g/cm2 -5.6% - DualFemur Neck Right 08/23/2017 76.2 Osteoporosis -2.5 0.684 g/cm2 - - DualFemur Total Mean 06/02/2023 82.0 Osteopenia -1.1 0.873 g/cm2 4.1% Yes DualFemur Total Mean 10/17/2019 78.3 Osteopenia -1.3 0.839 g/cm2 2.2% Yes DualFemur Total Mean 08/23/2017 76.2 Osteopenia -1.5 0.821 g/cm2 - - Left Forearm Radius 33% 06/02/2023 82.0 Osteoporosis -2.7 0.642 g/cm2 -0.9% - Left Forearm Radius 33% 10/17/2019 78.3 Osteoporosis -2.6 0.648 g/cm2 -3.1% - Left Forearm Radius 33% 08/23/2017 76.2 Osteopenia -2.4 0.669 g/cm2 - - ASSESSMENT: The BMD measured at Forearm Radius 33% is 0.642 g/cm2 with a T-score of -2.7. This patient is considered osteoporotic according to World Health Organization [facility]) criteria. The scan quality is good. Lumbar spine was not utilized due to advanced degenerative changes. Compared with prior study, there has been a significant increase in the total hip. World Science writer [facility]) criteria for post-menopausal, Caucasian Women: Normal:                   T-score at or above -1 SD Osteopenia/low bone mass: T-score between -1 and -2.5 SD Osteoporosis:             T-score at or below -2.5 SD RECOMMENDATIONS: 1. All patients should optimize calcium and vitamin D intake. 2. Consider FDA-approved medical therapies in postmenopausal women and men aged 58 years and older, based on the following: a. A hip or vertebral(clinical or morphometric) fracture b. T-score < -2.5 at the femoral neck or spine after appropriate evaluation to exclude secondary causes c. Low bone mass (T-score between -1.0 and -2.5 at the femoral neck or spine) and a 10-year probability of a  hip fracture > 3% or a 10-year probability of a major osteoporosis-related fracture > 20% based on the [place] -adapted WHO algorithm 3. Clinician judgment and/or patient preferences may indicate treatment for people with 10-year fracture probabilities above or below these levels FOLLOW-UP: People with diagnosed cases of osteoporosis or at high risk for fracture should have regular bone mineral density tests. For patients eligible for Medicare, routine testing is allowed once every 2 years. The testing frequency can be increased to one year for patients who have rapidly progressing disease, those who are receiving or discontinuing medical therapy to restore bone mass, or have additional risk factors. I have reviewed this report, and agree with the above findings. [facility] Electronically Signed   By: Alinda Apley M.D.   On: 06/02/2023 11:50    No results found.  No results found.    Assessment and Plan: Patient Active Problem List  Diagnosis Date Noted   CPAP use counseling 08/21/2023   Primary hypertension 08/21/2023   Viral URI 06/29/2023   Mood disorder (HCC) 06/29/2023   Vitamin D deficiency 06/29/2023   Vitamin B 12 deficiency 06/29/2023   Acute cystitis with hematuria 05/27/2022   Acute kidney injury superimposed on chronic kidney disease (HCC) 05/26/2022   Gastroparesis 05/26/2022   Memory difficulty 05/12/2022   Arthralgia 11/08/2021   Skin tear of lower leg without complication, initial encounter 10/01/2021   Thrombocytopenia (HCC) 07/02/2021   Trochanteric bursitis, right hip 12/08/2020   Foraminal stenosis of lumbosacral region 12/08/2020   Lumbar herniated disc 12/08/2020   Lumbar radiculopathy 12/02/2020   Rash 09/29/2020   Onychomycosis 08/11/2020   Allergic rhinitis 08/11/2020   Hyperlipidemia associated with type 2 diabetes mellitus (HCC) 11/15/2019   Low back pain 11/15/2019   Tick bite 10/01/2019   Type 2 diabetes mellitus with complications (HCC)  03/18/2019   Hypertension associated with diabetes (HCC) 03/18/2019   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   Cervico-occipital neuralgia 07/23/2018   Osteoporosis 07/23/2018   Lesion of rectum 09/07/2017   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   Cervical spondylosis without myelopathy 08/15/2017   DDD (degenerative disc disease), cervical 08/15/2017   Bursitis of hip 08/15/2017   Female stress incontinence 08/15/2017   Spondylolisthesis, congenital 08/15/2017   Radial styloid tenosynovitis 08/15/2017   Senile osteoporosis 08/15/2017   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   Rectal hemorrhage 07/25/2017   Essential tremor 04/26/2017   Left ventricular hypertrophy 10/12/2016   Chronic kidney disease, stage 4 (severe) (HCC) 08/26/2016   OSA on CPAP 08/26/2016   1. OSA on CPAP (Primary) The patient does tolerate PAP and reports  benefit from PAP use. Her usage is not consistent this past year. We discussed that her mask is uncomfortable and she would like to change it so a mask fitting will be scheduled.  The patient was reminded how to clean equipment and advised to replace supplies routinely. . The compliance is poor. The AHI is 0.8.   OSA on cpap- controlled. Increase compliance with pap. Mask fitting.  CPAP continues to be medically necessary to treat this patient's OSA. F/u one year.    2. CPAP use counseling CPAP Counseling: had a lengthy discussion with the patient regarding the importance of PAP therapy in management of the sleep apnea. Patient appears to understand the risk factor reduction and also understands the risks associated with untreated sleep apnea. Patient will try to make a good faith effort to remain compliant with therapy. Also instructed the patient on proper cleaning of the device including the water must be changed daily if possible and use of distilled water is preferred. Patient understands that the machine should be regularly  cleaned with appropriate recommended cleaning solutions that do not damage the PAP machine for example given white vinegar and water rinses. Other methods such as ozone treatment may not be as good as these simple methods to achieve cleaning.   3. Primary hypertension Controlled with losartan. Continue.     General Counseling: I have discussed the findings of the evaluation and examination with Charmine.  I have also discussed any further diagnostic evaluation thatmay be needed or ordered today. Shakinah verbalizes understanding of the findings of todays visit. We also reviewed her medications today and discussed drug interactions and side effects including but not limited excessive drowsiness and altered mental states. We also discussed that there is always a risk not just to her  but also people around her. she has been encouraged to call the office with any questions or concerns that should arise related to todays visit.  No orders of the defined types were placed in this encounter.       I have personally obtained a history, examined the patient, evaluated laboratory and imaging results, formulated the assessment and plan and placed orders. This patient was seen today by Louann Rous, PA-C in collaboration with Dr. Cam Cava.   Cordie Deters, MD [facility] Diplomate ABMS Pulmonary Critical Care Medicine and Sleep Medicine

## 2023-08-21 ENCOUNTER — Ambulatory Visit (INDEPENDENT_AMBULATORY_CARE_PROVIDER_SITE_OTHER): Admitting: Internal Medicine

## 2023-08-21 VITALS — BP 140/64 | HR 84 | Resp 16 | Ht 63.0 in | Wt 137.0 lb

## 2023-08-21 DIAGNOSIS — Z7189 Other specified counseling: Secondary | ICD-10-CM

## 2023-08-21 DIAGNOSIS — G4733 Obstructive sleep apnea (adult) (pediatric): Secondary | ICD-10-CM

## 2023-08-21 DIAGNOSIS — I10 Essential (primary) hypertension: Secondary | ICD-10-CM

## 2023-08-21 DIAGNOSIS — E119 Type 2 diabetes mellitus without complications: Secondary | ICD-10-CM | POA: Insufficient documentation

## 2023-08-21 NOTE — Patient Instructions (Signed)

## 2023-09-01 ENCOUNTER — Ambulatory Visit
Admission: RE | Admit: 2023-09-01 | Discharge: 2023-09-01 | Disposition: A | Discharge status: Home / Self Care | Source: Ambulatory Visit | Attending: Emergency Medicine | Admitting: Emergency Medicine

## 2023-09-01 VITALS — BP 167/67 | HR 66 | Temp 98.1°F | Resp 18

## 2023-09-01 DIAGNOSIS — R350 Frequency of micturition: Secondary | ICD-10-CM | POA: Diagnosis not present

## 2023-09-01 LAB — POCT URINALYSIS DIP (MANUAL ENTRY)
Bilirubin, UA: NEGATIVE
Blood, UA: NEGATIVE
Glucose, UA: NEGATIVE mg/dL
Ketones, POC UA: NEGATIVE mg/dL
Leukocytes, UA: NEGATIVE
Nitrite, UA: NEGATIVE
Protein Ur, POC: >=300 mg/dL — AB
Spec Grav, UA: >=1.03 — AB (ref 1.010–1.025)
Urobilinogen, UA: 0.2 U/dL
pH, UA: 5.5 (ref 5.0–8.0)

## 2023-09-01 MED ORDER — CEPHALEXIN 500 MG PO CAPS
500.0000 mg | ORAL_CAPSULE | Freq: Two times a day (BID) | ORAL | 0 refills | Status: AC
Start: 2023-09-01 — End: 2023-09-06

## 2023-09-01 NOTE — ED Provider Notes (Signed)
 Kelly [name]    CSN: [id number] Arrival date & time: 09/01/23  1400      History   Chief Complaint Chief Complaint  Patient presents with   Urinary Tract Infection    HPI Kelly [name] is a 83 y.o. female.   Patient presents for evaluation of urinary frequency and dysuria beginning 1 day ago.  Has not attempted treatment.  Denies abdominal or flank pain, fever, hematuria or vaginal symptoms.  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     Patient Active Problem List   Diagnosis Date Noted   CPAP use counseling 08/21/2023   Primary hypertension 08/21/2023   Viral URI 06/29/2023   Mood disorder (HCC) 06/29/2023   Vitamin D  deficiency 06/29/2023   Vitamin B 12 deficiency 06/29/2023   Acute cystitis with hematuria 05/27/2022   Acute kidney injury superimposed on chronic kidney disease (HCC) 05/26/2022   Gastroparesis 05/26/2022   Memory difficulty 05/12/2022   Arthralgia 11/08/2021   Skin tear of lower leg without complication, initial encounter 10/01/2021   Thrombocytopenia (HCC) 07/02/2021   Trochanteric bursitis, right hip 12/08/2020   Foraminal stenosis of lumbosacral region 12/08/2020   Lumbar herniated disc 12/08/2020   Lumbar radiculopathy 12/02/2020   Rash 09/29/2020   Onychomycosis 08/11/2020   Allergic rhinitis 08/11/2020   Hyperlipidemia associated with type 2 diabetes mellitus (HCC) 11/15/2019   Low back pain 11/15/2019   Tick bite 10/01/2019   Type 2 diabetes mellitus with complications (HCC) 03/18/2019   Hypertension associated with diabetes (HCC) 03/18/2019   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   Cervico-occipital neuralgia 07/23/2018    Osteoporosis 07/23/2018   Lesion of rectum 09/07/2017   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   Cervical spondylosis without myelopathy 08/15/2017   DDD (degenerative disc disease), cervical 08/15/2017   Bursitis of hip 08/15/2017   Female stress incontinence 08/15/2017   Spondylolisthesis, congenital 08/15/2017   Radial styloid tenosynovitis 08/15/2017   Senile osteoporosis 08/15/2017   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   Rectal hemorrhage 07/25/2017   Essential tremor 04/26/2017   Left ventricular hypertrophy 10/12/2016   Chronic kidney disease, stage 4 (severe) (HCC) 08/26/2016   OSA on CPAP 08/26/2016    Past Surgical History:  Procedure Laterality Date   APPENDECTOMY     BREAST BIOPSY Right 06/19/2018   affirm stereo/x clip/COLUMNAR CELL CHANGE WITH MICROCALCIFICATIONS. FIBROCYSTIC CHANGES WITH MICROCALCIFICATIONS   cataract  03/2017   CHOLECYSTECTOMY     COLONOSCOPY     COLONOSCOPY WITH PROPOFOL  N/A 08/22/2017   Procedure: COLONOSCOPY WITH PROPOFOL ;  Surgeon: Luke Salaam, MD;  Location: [facility] ENDOSCOPY;  Service: Gastroenterology;  Laterality: N/A;   ESOPHAGOGASTRODUODENOSCOPY     ESOPHAGOGASTRODUODENOSCOPY (EGD) WITH PROPOFOL  N/A 03/23/2016   Procedure: ESOPHAGOGASTRODUODENOSCOPY (EGD) WITH PROPOFOL ;  Surgeon: Cassie Click, MD;  Location: [facility] ENDOSCOPY;  Service: Endoscopy;  Laterality: N/A;   FLEXIBLE SIGMOIDOSCOPY N/A 02/06/2018   Procedure: FLEXIBLE SIGMOIDOSCOPY;  Surgeon: Luke Salaam, MD;  Location: [facility] ENDOSCOPY;  Service: Gastroenterology;  Laterality: N/A;   FLEXIBLE SIGMOIDOSCOPY N/A 12/03/2018   Procedure: FLEXIBLE SIGMOIDOSCOPY;  Surgeon: Luke Salaam, MD;  Location: [facility] ENDOSCOPY;  Service: Gastroenterology;  Laterality: N/A;   RECTAL PROLAPSE REPAIR  2012   x2   skin cancer removed from hand  2024    OB History   No obstetric history on file.      Home Medications    Prior to Admission medications   Medication Sig Start Date End  Date Taking? Authorizing Provider  cephALEXin  (KEFLEX ) 500 MG capsule Take 1 capsule (500 mg total) by mouth 2 (two) times daily for 5 days. 09/01/23 09/06/23 Yes Lawton Dollinger R, NP  amLODipine  (NORVASC ) 10 MG tablet Take 1 tablet (10 mg total) by mouth daily. 08/24/22   Kent Pear, MD  aspirin  EC 81 MG tablet Take 81 mg by mouth daily.    [provider]  azelastine  (ASTELIN ) 0.1 % nasal spray Place 2 sprays into both nostrils 2 (two) times daily. Use in each nostril as directed Patient not taking: Reported on 08/02/2023 06/22/23   Valli Gaw, MD  calcitRIOL (ROCALTROL) 0.25 MCG capsule Take 0.25 mcg by mouth daily.    [provider]  Calcium  Citrate-Vitamin D  315-5 MG-MCG TABS Take 1 tablet by mouth daily.    [provider]  Cholecalciferol (VITAMIN D ) 50 MCG (2000 UT) CAPS Take 2,000 Units by mouth daily.     [provider]  Continuous Glucose Sensor (FREESTYLE LIBRE 2 SENSOR) MISC Use to check glucose 4 times daily 01/10/23   Wendel Hals, NP  diclofenac (FLECTOR) 1.3 % PTCH 1 patch 2 (two) times daily. 04/13/22   [provider]  fluocinonide -emollient (LIDEX -E) 0.05 % cream Apply 1 Application topically 4 (four) times daily. 07/21/23   Carollynn Cirri, NP  fluticasone  (FLONASE ) 50 MCG/ACT nasal spray Place 2 sprays into both nostrils 2 (two) times daily. Patient not taking: Reported on 08/02/2023 06/22/23   Valli Gaw, MD  furosemide  (LASIX ) 40 MG tablet Take 40 mg by mouth daily as needed.    [provider]  glucosamine-chondroitin 500-400 MG tablet Take 1 tablet by mouth 3 (three) times daily.    [provider]  HYDROcodone -acetaminophen  (NORCO/VICODIN) 5-325 MG tablet Take 1 tablet by mouth every 4 (four) hours as needed.    [provider]  Insulin  Glargine (BASAGLAR  KWIKPEN) 100 UNIT/ML Inject 14 Units into the skin at bedtime. 04/27/23   Wendel Hals, NP  Insulin  Pen Needle (BD PEN NEEDLE NANO  2ND GEN) 32G X 4 MM MISC Use to monitor glucose twice daily 01/10/23   Wendel Hals, NP  Lancets MISC Use up to 4 times daily to check blood sugars. 11/04/16   Cook, [name], DO  losartan  (COZAAR ) 100 MG tablet Take 100 mg by mouth daily.    [provider]  Melatonin 5 MG CHEW Chew 5 mg by mouth at bedtime as needed.    [provider]  NARCAN 4 MG/0.1ML LIQD nasal spray kit  07/13/22   [provider]  ondansetron  (ZOFRAN -ODT) 4 MG disintegrating tablet Take by mouth. Patient not taking: Reported on 08/02/2023 05/28/22   [provider]  potassium chloride (K-DUR) 10 MEQ tablet Take 10 mEq by mouth daily as needed.    [provider]  rosuvastatin  (CRESTOR ) 10 MG tablet Take 1 tablet (10 mg total) by mouth daily. 08/24/22   Kent Pear, MD  sertraline  (ZOLOFT ) 50 MG tablet Take 1 tablet (50 mg total) by mouth daily. 08/04/22   Kent Pear, MD    Family History Family History  Problem Relation Age of Onset  Stroke Mother    Arthritis Mother    Aortic aneurysm Sister    Lung cancer Sister    Heart attack Father 26   Breast cancer Sister 102    Social History Social History   Tobacco Use   Smoking status: Never   Smokeless tobacco: Never  Vaping Use   Vaping status: Never Used  Substance Use Topics   Alcohol use: No   Drug use: No     Allergies   Prilosec otc [omeprazole magnesium], Sucralfate , Amoxicillin , and Morphine and codeine   Review of Systems Review of Systems   Physical Exam Triage Vital Signs ED Triage Vitals  Encounter Vitals Group     BP 09/01/23 1411 (!) 167/67     Systolic BP Percentile --      Diastolic BP Percentile --      Pulse Rate 09/01/23 1411 66     Resp 09/01/23 1411 18     Temp 09/01/23 1411 98.1 F (36.7 C)     Temp Source 09/01/23 1411 Oral     SpO2 09/01/23 1411 95 %     Weight --      Height --      Head Circumference --      Peak Flow --      Pain Score 09/01/23 1412 0      Pain Loc --      Pain Education --      Exclude from Growth Chart --    No data found.  Updated Vital Signs BP (!) 167/67 (BP Location: Right Arm)   Pulse 66   Temp 98.1 F (36.7 C) (Oral)   Resp 18   SpO2 95%   Visual Acuity Right Eye Distance:   Left Eye Distance:   Bilateral Distance:    Right Eye Near:   Left Eye Near:    Bilateral Near:     Physical Exam Constitutional:      Appearance: Normal appearance.  Eyes:     Extraocular Movements: Extraocular movements intact.  Pulmonary:     Effort: Pulmonary effort is normal.  Abdominal:     Tenderness: There is no abdominal tenderness. There is no right CVA tenderness, left CVA tenderness or guarding.  Neurological:     Mental Status: She is alert and oriented to person, place, and time. Mental status is at baseline.      UC Treatments / Results  Labs (all labs ordered are listed, but only abnormal results are displayed) Labs Reviewed  POCT URINALYSIS DIP (MANUAL ENTRY) - Abnormal; Notable for the following components:      Result Value   Color, UA yellow (*)    Spec Grav, UA >=1.030 (*)    Protein Ur, POC >=300 (*)    All other components within normal limits  URINE CULTURE    EKG   Radiology No results found.  Procedures Procedures (including critical care time)  Medications Ordered in UC Medications - No data to display  Initial Impression / Assessment and Plan / UC Course  I have reviewed the triage vital signs and the nursing notes.  Pertinent labs & imaging results that were available during my care of the patient were reviewed by me and considered in my medical decision making (see chart for details).  Urinary frequency  Urinalysis is negative, discussed findings with patient, history of diabetes uncontrolled, recommended yeast testing but declined, prophylactically placed on cephalexin  and urine has been sent to culture, recommended supportive care with follow-up with  PCP as needed Final  Clinical Impressions(s) / UC Diagnoses   Final diagnoses:  Urinary frequency     Discharge Instructions      ,Your urinalysis does not show infection, your urine will be sent to the lab to determine exactly which bacteria is present, if any changes need to be made to your medications you will be notified  Begin use of cephalexin  twice daily for 5 days prophylactically as you have symptoms  If no bacterial growth is seen on your urine test I would strongly recommend that you be checked for a yeast infection  You may use over-the-counter Azo to help minimize your symptoms until antibiotic removes bacteria, this medication will turn your urine orange  Increase your fluid intake through use of water  As always practice good hygiene, wiping front to back and avoidance of scented vaginal products to prevent further irritation  If symptoms continue to persist after use of medication or recur please follow-up with urgent care or your primary doctor as needed    ED Prescriptions     Medication Sig Dispense Auth. Provider   cephALEXin  (KEFLEX ) 500 MG capsule Take 1 capsule (500 mg total) by mouth 2 (two) times daily for 5 days. 10 capsule Galena Logie R, NP      PDMP not reviewed this encounter.   Reena Canning, NP 09/01/23 1435

## 2023-09-01 NOTE — Discharge Instructions (Signed)
,  Your urinalysis does not show infection, your urine will be sent to the lab to determine exactly which bacteria is present, if any changes need to be made to your medications you will be notified  Begin use of cephalexin  twice daily for 5 days prophylactically as you have symptoms  If no bacterial growth is seen on your urine test I would strongly recommend that you be checked for a yeast infection  You may use over-the-counter Azo to help minimize your symptoms until antibiotic removes bacteria, this medication will turn your urine orange  Increase your fluid intake through use of water  As always practice good hygiene, wiping front to back and avoidance of scented vaginal products to prevent further irritation  If symptoms continue to persist after use of medication or recur please follow-up with urgent care or your primary doctor as needed

## 2023-09-01 NOTE — ED Triage Notes (Signed)
 Pt present urinary frequency with burning sensation. Symptoms started yesterday evening.

## 2023-09-03 LAB — URINE CULTURE

## 2023-09-08 ENCOUNTER — Telehealth: Payer: Self-pay

## 2023-09-08 NOTE — Telephone Encounter (Signed)
 Prolia  VOB initiated via [email or web address]  Next Prolia  inj DUE: 10/09/23

## 2023-09-12 NOTE — Telephone Encounter (Signed)
 Pt ready for scheduling for PROLIA  on or after : 10/09/23  Option# 1: Buy/Bill (Office supplied medication)  Out-of-pocket cost due at time of clinic visit: $0  Number of injection/visits approved: ---  Primary: MEDICARE Prolia  co-insurance: 0% Admin fee co-insurance: 0%  Secondary: BC[employer]-FE[place] Prolia  co-insurance:  Admin fee co-insurance:   Medical Benefit Details: Date Benefits were checked: 09/12/23 Deductible: $257 Met of $257 Required/ Coinsurance: 0%/ Admin Fee: 0%  Prior Auth: N/A PA# Expiration Date:   # of doses approved: ----------------------------------------------------------------------- Option# 2- Med Obtained from pharmacy:  Pharmacy benefit: Copay $--- (Paid to pharmacy) Admin Fee: --- (Pay at clinic)  Prior Auth: --- PA# Expiration Date:   # of doses approved:   If patient wants fill through the pharmacy benefit please send prescription to:  --- , and include estimated need by date in rx notes. Pharmacy will ship medication directly to the office.  Patient NOT eligible for Prolia  Copay Card. Copay Card can make patient's cost as little as $25. Link to apply: [email or web address]://[email or web address]  ** This summary of benefits is an estimation of the patient's out-of-pocket cost. Exact cost may very based on individual plan coverage.

## 2023-09-12 NOTE — Telephone Encounter (Signed)
 Kelly [name]

## 2023-09-13 ENCOUNTER — Encounter (HOSPITAL_COMMUNITY): Payer: Self-pay

## 2023-09-13 ENCOUNTER — Other Ambulatory Visit: Payer: Medicare Other

## 2023-09-13 DIAGNOSIS — E538 Deficiency of other specified B group vitamins: Secondary | ICD-10-CM | POA: Diagnosis not present

## 2023-09-13 DIAGNOSIS — N184 Chronic kidney disease, stage 4 (severe): Secondary | ICD-10-CM | POA: Diagnosis not present

## 2023-09-13 DIAGNOSIS — E785 Hyperlipidemia, unspecified: Secondary | ICD-10-CM | POA: Diagnosis not present

## 2023-09-13 DIAGNOSIS — E1169 Type 2 diabetes mellitus with other specified complication: Secondary | ICD-10-CM | POA: Diagnosis not present

## 2023-09-13 DIAGNOSIS — E559 Vitamin D deficiency, unspecified: Secondary | ICD-10-CM

## 2023-09-13 DIAGNOSIS — I152 Hypertension secondary to endocrine disorders: Secondary | ICD-10-CM | POA: Diagnosis not present

## 2023-09-13 DIAGNOSIS — E1159 Type 2 diabetes mellitus with other circulatory complications: Secondary | ICD-10-CM | POA: Diagnosis not present

## 2023-09-13 DIAGNOSIS — E118 Type 2 diabetes mellitus with unspecified complications: Secondary | ICD-10-CM | POA: Diagnosis not present

## 2023-09-13 LAB — COMPREHENSIVE METABOLIC PANEL WITH GFR
ALT: 10 U/L (ref 0–35)
AST: 14 U/L (ref 0–37)
Albumin: 3.8 g/dL (ref 3.5–5.2)
Alkaline Phosphatase: 78 U/L (ref 39–117)
BUN: 28 mg/dL — ABNORMAL HIGH (ref 6–23)
CO2: 28 meq/L (ref 19–32)
Calcium: 9.4 mg/dL (ref 8.4–10.5)
Chloride: 106 meq/L (ref 96–112)
Creatinine, Ser: 2.05 mg/dL — ABNORMAL HIGH (ref 0.40–1.20)
GFR: 22.2 mL/min — ABNORMAL LOW (ref >60.00)
Glucose, Bld: 205 mg/dL — ABNORMAL HIGH (ref 70–99)
Potassium: 4.2 meq/L (ref 3.5–5.1)
Sodium: 139 meq/L (ref 135–145)
Total Bilirubin: 0.5 mg/dL (ref 0.2–1.2)
Total Protein: 6.5 g/dL (ref 6.0–8.3)

## 2023-09-13 LAB — LIPID PANEL
Cholesterol: 163 mg/dL (ref 0–200)
HDL: 28.4 mg/dL — ABNORMAL LOW (ref >39.00)
LDL Cholesterol: 98 mg/dL (ref 0–99)
NonHDL: 134.22
Total CHOL/HDL Ratio: 6
Triglycerides: 183 mg/dL — ABNORMAL HIGH (ref 0.0–149.0)
VLDL: 36.6 mg/dL (ref 0.0–40.0)

## 2023-09-13 LAB — MICROALBUMIN / CREATININE URINE RATIO
Creatinine,U: 114.1 mg/dL
Microalb Creat Ratio: 238.4 mg/g — ABNORMAL HIGH (ref 0.0–30.0)
Microalb, Ur: 27.2 mg/dL — ABNORMAL HIGH (ref 0.0–1.9)

## 2023-09-13 LAB — VITAMIN B12: Vitamin B-12: 184 pg/mL — ABNORMAL LOW (ref 211–911)

## 2023-09-13 LAB — VITAMIN D 25 HYDROXY (VIT D DEFICIENCY, FRACTURES): VITD: 41.85 ng/mL (ref 30.00–100.00)

## 2023-09-20 ENCOUNTER — Ambulatory Visit: Payer: Medicare Other | Admitting: Family Medicine

## 2023-09-20 ENCOUNTER — Encounter: Payer: Self-pay | Admitting: Family Medicine

## 2023-09-20 VITALS — BP 142/68 | HR 61 | Temp 98.0°F | Resp 20 | Ht 63.0 in | Wt 136.5 lb

## 2023-09-20 DIAGNOSIS — Z7984 Long term (current) use of oral hypoglycemic drugs: Secondary | ICD-10-CM | POA: Diagnosis not present

## 2023-09-20 DIAGNOSIS — E785 Hyperlipidemia, unspecified: Secondary | ICD-10-CM

## 2023-09-20 DIAGNOSIS — E1169 Type 2 diabetes mellitus with other specified complication: Secondary | ICD-10-CM

## 2023-09-20 DIAGNOSIS — N184 Chronic kidney disease, stage 4 (severe): Secondary | ICD-10-CM | POA: Diagnosis not present

## 2023-09-20 DIAGNOSIS — F39 Unspecified mood [affective] disorder: Secondary | ICD-10-CM | POA: Diagnosis not present

## 2023-09-20 DIAGNOSIS — I152 Hypertension secondary to endocrine disorders: Secondary | ICD-10-CM

## 2023-09-20 DIAGNOSIS — E1159 Type 2 diabetes mellitus with other circulatory complications: Secondary | ICD-10-CM | POA: Diagnosis not present

## 2023-09-20 DIAGNOSIS — E118 Type 2 diabetes mellitus with unspecified complications: Secondary | ICD-10-CM

## 2023-09-20 DIAGNOSIS — E538 Deficiency of other specified B group vitamins: Secondary | ICD-10-CM

## 2023-09-20 DIAGNOSIS — M51369 Other intervertebral disc degeneration, lumbar region without mention of lumbar back pain or lower extremity pain: Secondary | ICD-10-CM | POA: Insufficient documentation

## 2023-09-20 DIAGNOSIS — G2581 Restless legs syndrome: Secondary | ICD-10-CM | POA: Diagnosis not present

## 2023-09-20 MED ORDER — AMLODIPINE BESYLATE 10 MG PO TABS: 10.0000 mg | ORAL_TABLET | Freq: Every day | ORAL | 3 refills | Status: DC

## 2023-09-20 MED ORDER — SERTRALINE HCL 50 MG PO TABS: 50.0000 mg | ORAL_TABLET | Freq: Every day | ORAL | 3 refills | Status: DC

## 2023-09-20 MED ORDER — ROPINIROLE HCL 0.25 MG PO TABS: 0.2500 mg | ORAL_TABLET | Freq: Every day | ORAL | 3 refills | Status: DC

## 2023-09-20 MED ORDER — VITAMIN B-12 1000 MCG PO TABS: 1000.0000 ug | ORAL_TABLET | Freq: Every day | ORAL | 3 refills | Status: AC

## 2023-09-20 MED ORDER — ROSUVASTATIN CALCIUM 10 MG PO TABS: 10.0000 mg | ORAL_TABLET | Freq: Every day | ORAL | 3 refills | Status: DC

## 2023-09-20 NOTE — Progress Notes (Signed)
 SUBJECTIVE:   Chief Complaint  Patient presents with   Medical Management of Chronic Issues    3 month follow up   HPI Presents for follow up chronic disease management  Discussed the use of AI scribe software for clinical note transcription with the patient, who gave verbal consent to proceed.  History of Present Illness Kelly [name] is an 83 year old female with diabetes and kidney issues who presents for a follow-up visit.  Her diabetes management is currently suboptimal, with high blood sugar levels and a suspected elevated A1c, possibly around 7 or 8. She is on Basaglar  and has previously used Ozempic  and Victoza  but discontinued them. She is not on Farxiga  or Jardiance. She experiences shortness of breath and fatigue, particularly at night, and has a history of a leaky mitral valve. She has an upcoming cardiology appointment for further evaluation.  Her cholesterol levels remain elevated, particularly her LDL. She is on Crestor  10 mg but not on Zetia. She acknowledges dietary habits that are not optimal, often eating only one large meal a day.  She has chronic kidney disease with proteinuria and is under the care of a nephrologist, Dr. Zelda Hickman. Her recent blood work showed a decrease in kidney function. She is on olmesartan for blood pressure management and occasionally takes Lasix  for leg swelling, though it has been two months since her last dose. She takes potassium only when she takes Lasix .  She experiences restless legs at night, which have worsened recently. She has tried gabapentin  in the past for neuropathy without relief and has used home remedies like vinegar and mustard without success.  She is currently taking Norco for back pain and has a history of low vitamin B12 levels, for which she is not currently receiving treatment. She has tried metformin in the past but discontinued it due to gastrointestinal side effects.     PERTINENT PMH / PSH: As  above  OBJECTIVE:  BP (!) 142/68   Pulse 61   Temp 98 F (36.7 C)   Resp 20   Ht 5\' 3"  (1.6 m)   Wt 136 lb 8 oz (61.9 kg)   SpO2 95%   BMI 24.18 kg/m    Physical Exam Vitals reviewed.  Constitutional:      General: She is not in acute distress.    Appearance: She is not ill-appearing.  HENT:     Head: Normocephalic.     Right Ear: Tympanic membrane, ear canal and external ear normal.     Left Ear: Tympanic membrane, ear canal and external ear normal.     Nose: Nose normal.     Mouth/Throat:     Mouth: Mucous membranes are moist.  Eyes:     Extraocular Movements: Extraocular movements intact.     Conjunctiva/sclera: Conjunctivae normal.     Pupils: Pupils are equal, round, and reactive to light.  Neck:     Thyroid : No thyromegaly or thyroid  tenderness.     Vascular: No carotid bruit.  Cardiovascular:     Rate and Rhythm: Normal rate and regular rhythm.     Pulses: Normal pulses.     Heart sounds: Normal heart sounds.  Pulmonary:     Effort: Pulmonary effort is normal.     Breath sounds: Normal breath sounds.  Abdominal:     General: Bowel sounds are normal. There is no distension.     Palpations: Abdomen is soft.     Tenderness: There is no abdominal tenderness. There is no right  CVA tenderness, left CVA tenderness, guarding or rebound.  Musculoskeletal:        General: Normal range of motion.     Cervical back: Normal range of motion.     Right lower leg: No edema.     Left lower leg: No edema.  Lymphadenopathy:     Cervical: No cervical adenopathy.  Skin:    Capillary Refill: Capillary refill takes less than 2 seconds.  Neurological:     General: No focal deficit present.     Mental Status: She is alert and oriented to person, place, and time. Mental status is at baseline.     Motor: No weakness.  Psychiatric:        Mood and Affect: Mood normal.        Behavior: Behavior normal.        Thought Content: Thought content normal.        Judgment: Judgment  normal.           07/21/2023    2:25 PM 06/22/2023    1:01 PM 04/21/2023    1:16 PM 02/13/2023    2:24 PM 01/06/2023    2:13 PM  Depression screen PHQ 2/9  Decreased Interest 0 0 0 0 0  Down, Depressed, Hopeless 0 0 0 0 0  PHQ - 2 Score 0 0 0 0 0  Altered sleeping  0 0 2 0  Tired, decreased energy  0 0 1 0  Change in appetite  0 0 0 0  Feeling bad or failure about yourself   0 0 0 0  Trouble concentrating  0 0 0 0  Moving slowly or fidgety/restless  0 0 0 0  Suicidal thoughts  0 0 0 0  PHQ-9 Score  0 0 3 0  Difficult doing work/chores  Not difficult at all Not difficult at all Not difficult at all Not difficult at all      07/21/2023    2:25 PM 06/22/2023    1:01 PM 04/21/2023    1:16 PM 01/06/2023    2:13 PM  GAD 7 : Generalized Anxiety Score  Nervous, Anxious, on Edge 0 0 0 0  Control/stop worrying 0 0 0 0  Worry too much - different things 0 0 0 0  Trouble relaxing 0 0 0 0  Restless 0 0 0 0  Easily annoyed or irritable 0 0 0 0  Afraid - awful might happen 0 0 0 0  Total GAD 7 Score 0 0 0 0  Anxiety Difficulty Not difficult at all Not difficult at all Not difficult at all Not difficult at all    ASSESSMENT/PLAN:  Restless leg Assessment & Plan: Recent exacerbation. Gabapentin  ineffective. Considering ropinirole  pending safety evaluation with kidney function. - Evaluate safety of ropinirole  with current kidney function. - Trial ropinirole  0.25 mg qhs  Orders: -     rOPINIRole  HCl; Take 1 tablet (0.25 mg total) by mouth at bedtime.  Dispense: 90 tablet; Refill: 3  Mood disorder (HCC) -     Sertraline  HCl; Take 1 tablet (50 mg total) by mouth daily.  Dispense: 90 tablet; Refill: 3  Hypertension associated with diabetes [facility]) Assessment & Plan: Well controlled on current medication.  Goal <140/90 per JNC 8 guidelines -Refill Amlodipine  10 mg daily -Continue Losartan  100 mg daily, managed by Nephrology -Continue to monitor BP at home   Type 2 diabetes mellitus  with complications (HCC) Assessment & Plan: Compliant with insulin  14 units daily -Recommend adding SGLT2 -Follows  with Endocrinology  Orders: -     amLODIPine  Besylate; Take 1 tablet (10 mg total) by mouth daily.  Dispense: 90 tablet; Refill: 3  Hyperlipidemia associated with type 2 diabetes mellitus (HCC) Assessment & Plan: Currently on statin and tolerating well -Refill Crestor  10 mg daily   Orders: -     Rosuvastatin  Calcium ; Take 1 tablet (10 mg total) by mouth daily.  Dispense: 90 tablet; Refill: 3  Vitamin B 12 deficiency Assessment & Plan: Deficiency identified in recent labs. Prefers oral supplementation. - Start vitamin B12  Orders: -     Vitamin B-12; Take 1 tablet (1,000 mcg total) by mouth daily.  Dispense: 90 tablet; Refill: 3  Chronic kidney disease, stage 4 (severe) (HCC) Assessment & Plan: Discussed the potential harm of Lasix  on kidney function if used for leg swelling. -Follow up with Nephrology if needing to continue given decreasing GFR.  -Recent GFR 31 -Will defer refills to Nephro            PDMP reviewed  No follow-ups on file.  Valli Gaw, MD

## 2023-09-20 NOTE — Patient Instructions (Addendum)
 It was a pleasure meeting you today. Thank you for allowing me to take part in your health care.  Our goals for today as we discussed include:   Last A1c 9.8 in March 2025.  Too early to repeat Follow up with Endocrinology   Start Ropinerol 0.25 mg at night for restless leg  Start Vitamin B 12 1000 mcg daily  Refills sent for requested medications  Follow up with Nephrology as scheduled   This is a list of the screening recommended for you and due dates:  Health Maintenance  Topic Date Due   COVID-19 Vaccine (5 - 2024-25 season) 01/08/2023   Complete foot exam   10/06/2023   Flu Shot  12/08/2023   Eye exam for diabetics  12/21/2023   Hemoglobin A1C  02/02/2024   Medicare Annual Wellness Visit  02/13/2024   Yearly kidney function blood test for diabetes  09/12/2024   Yearly kidney health urinalysis for diabetes  09/12/2024   DTaP/Tdap/Td vaccine (2 - Tdap) 02/25/2029   Pneumonia Vaccine  Completed   DEXA scan (bone density measurement)  Completed   Zoster (Shingles) Vaccine  Completed   HPV Vaccine  Aged Out   Meningitis B Vaccine  Aged Out   Hepatitis C Screening  Discontinued      If you have any questions or concerns, please do not hesitate to call the office at [phone].  I look forward to our next visit and until then take care and stay safe.  Regards,   Valli Gaw, MD   [facility]

## 2023-09-24 ENCOUNTER — Ambulatory Visit: Payer: Self-pay | Admitting: Family Medicine

## 2023-09-24 ENCOUNTER — Encounter: Payer: Self-pay | Admitting: Family Medicine

## 2023-09-24 DIAGNOSIS — G2581 Restless legs syndrome: Secondary | ICD-10-CM | POA: Insufficient documentation

## 2023-09-24 NOTE — Assessment & Plan Note (Signed)
 Discussed the potential harm of Lasix on kidney function if used for leg swelling. -Follow up with Nephrology if needing to continue given decreasing GFR. - Recent GFR 31 -Will defer refills to Nephro

## 2023-09-24 NOTE — Assessment & Plan Note (Signed)
 Compliant with insulin  14 units daily -Recommend adding SGLT2 -Follows with Endocrinology

## 2023-09-24 NOTE — Assessment & Plan Note (Signed)
 Deficiency identified in recent labs. Prefers oral supplementation. - Start vitamin B12

## 2023-09-24 NOTE — Assessment & Plan Note (Signed)
 Currently on statin and tolerating well -Refill Crestor  10 mg daily

## 2023-09-24 NOTE — Assessment & Plan Note (Signed)
 Well controlled on current medication.  Goal <140/90 per JNC 8 guidelines -Refill Amlodipine  10 mg daily -Continue Losartan  100 mg daily, managed by Nephrology -Continue to monitor BP at home

## 2023-09-24 NOTE — Assessment & Plan Note (Signed)
 Recent exacerbation. Gabapentin  ineffective. Considering ropinirole  pending safety evaluation with kidney function. - Evaluate safety of ropinirole  with current kidney function. - Trial ropinirole  0.25 mg qhs

## 2023-10-12 ENCOUNTER — Ambulatory Visit: Payer: Medicare Other

## 2023-10-16 ENCOUNTER — Ambulatory Visit

## 2023-10-16 DIAGNOSIS — M810 Age-related osteoporosis without current pathological fracture: Secondary | ICD-10-CM

## 2023-10-16 MED ORDER — DENOSUMAB 60 MG/ML ~~LOC~~ SOSY
60.0000 mg | PREFILLED_SYRINGE | SUBCUTANEOUS | Status: AC
  Administered 2024-04-24: 14:00:00 60 mg via SUBCUTANEOUS

## 2023-10-16 NOTE — Progress Notes (Signed)
Pt presented for their subcutaneous Prolia injection. Pt was identified through two identifiers. Pt was given the information packets about the Prolia and told to schedule their next injection 6 months out. Pt tolerated the subq injection well in the right arm.  

## 2023-10-18 DIAGNOSIS — M546 Pain in thoracic spine: Secondary | ICD-10-CM | POA: Diagnosis not present

## 2023-10-18 DIAGNOSIS — Q762 Congenital spondylolisthesis: Secondary | ICD-10-CM | POA: Diagnosis not present

## 2023-10-18 DIAGNOSIS — M810 Age-related osteoporosis without current pathological fracture: Secondary | ICD-10-CM | POA: Diagnosis not present

## 2023-10-18 DIAGNOSIS — T2101XA Burn of unspecified degree of chest wall, initial encounter: Secondary | ICD-10-CM | POA: Diagnosis not present

## 2023-10-18 DIAGNOSIS — M5032 Other cervical disc degeneration, mid-cervical region, unspecified level: Secondary | ICD-10-CM | POA: Diagnosis not present

## 2023-10-18 DIAGNOSIS — M461 Sacroiliitis, not elsewhere classified: Secondary | ICD-10-CM | POA: Diagnosis not present

## 2023-10-18 DIAGNOSIS — M533 Sacrococcygeal disorders, not elsewhere classified: Secondary | ICD-10-CM | POA: Diagnosis not present

## 2023-10-18 DIAGNOSIS — Z79899 Other long term (current) drug therapy: Secondary | ICD-10-CM | POA: Diagnosis not present

## 2023-10-18 DIAGNOSIS — E1165 Type 2 diabetes mellitus with hyperglycemia: Secondary | ICD-10-CM | POA: Diagnosis not present

## 2023-10-18 DIAGNOSIS — M47812 Spondylosis without myelopathy or radiculopathy, cervical region: Secondary | ICD-10-CM | POA: Diagnosis not present

## 2023-10-18 DIAGNOSIS — M25572 Pain in left ankle and joints of left foot: Secondary | ICD-10-CM | POA: Diagnosis not present

## 2023-10-18 DIAGNOSIS — M7062 Trochanteric bursitis, left hip: Secondary | ICD-10-CM | POA: Diagnosis not present

## 2023-10-20 ENCOUNTER — Ambulatory Visit: Attending: Cardiology | Admitting: Cardiology

## 2023-10-20 ENCOUNTER — Encounter: Payer: Self-pay | Admitting: Cardiology

## 2023-10-20 VITALS — BP 120/58 | HR 65 | Ht 63.0 in | Wt 138.0 lb

## 2023-10-20 DIAGNOSIS — R0609 Other forms of dyspnea: Secondary | ICD-10-CM | POA: Diagnosis not present

## 2023-10-20 DIAGNOSIS — E782 Mixed hyperlipidemia: Secondary | ICD-10-CM | POA: Insufficient documentation

## 2023-10-20 DIAGNOSIS — I2089 Other forms of angina pectoris: Secondary | ICD-10-CM | POA: Insufficient documentation

## 2023-10-20 DIAGNOSIS — I10 Essential (primary) hypertension: Secondary | ICD-10-CM | POA: Insufficient documentation

## 2023-10-20 MED ORDER — METOPROLOL TARTRATE 100 MG PO TABS: ORAL_TABLET | ORAL | 0 refills | Status: DC

## 2023-10-20 NOTE — Patient Instructions (Signed)
 Medication Instructions:  -take 100 mg of metoprolol two hours prior to CTA *If you need a refill on your cardiac medications before your next appointment, please call your pharmacy*  Lab Work: Your provider would like for you to have following labs drawn today BMP.    If you have labs (blood work) drawn today and your tests are completely normal, you will receive your results only by: MyChart Message (if you have MyChart) OR A paper copy in the mail If you have any lab test that is abnormal or we need to change your treatment, we will call you to review the results.  Testing/Procedures: Your physician has requested that you have an echocardiogram. Echocardiography is a painless test that uses sound waves to create images of your heart. It provides your doctor with information about the size and shape of your heart and how well your heart's chambers and valves are working.   You may receive an ultrasound enhancing agent through an IV if needed to better visualize your heart during the echo. This procedure takes approximately one hour.  There are no restrictions for this procedure.  This will take place at [address] [facility] [address] [facility]) [address], [place] [address]  Please note: We ask at that you not bring children with you during ultrasound (echo/ vascular) testing. Due to room size and safety concerns, children are not allowed in the ultrasound rooms during exams. Our front office staff cannot provide observation of children in our lobby area while testing is being conducted. An adult accompanying a patient to their appointment will only be allowed in the ultrasound room at the discretion of the ultrasound technician under special circumstances. We apologize for any inconvenience.     Your cardiac CT will be scheduled at:  [facility] [address] [place], [place] [address] [phone]  Please arrive 15 mins early for check-in and test  prep.  There is spacious parking and easy access to the radiology department from the [facility] Heart and Vascular entrance. Please enter here and check-in with the desk attendant.    Please follow these instructions carefully (unless otherwise directed):  An IV will be required for this test and Nitroglycerin will be given.  Hold all erectile dysfunction medications at least 3 days (72 hrs) prior to test. (Ie viagra, cialis, sildenafil, tadalafil, etc)   On the Night Before the Test: Be sure to Drink plenty of water. Do not consume any caffeinated/decaffeinated beverages or chocolate 12 hours prior to your test. Do not take any antihistamines 12 hours prior to your test.  On the Day of the Test: Drink plenty of water until 1 hour prior to the test. Do not eat any food 1 hour prior to test. You may take your regular medications prior to the test.  Take metoprolol (Lopressor) two hours prior to test. If you take Furosemide /Hydrochlorothiazide/Spironolactone/Chlorthalidone, please HOLD on the morning of the test. Patients who wear a continuous glucose monitor MUST remove the device prior to scanning. FEMALES- please wear underwire-free bra if available, avoid dresses & tight clothing       After the Test: Drink plenty of water. After receiving IV contrast, you may experience a mild flushed feeling. This is normal. On occasion, you may experience a mild rash up to 24 hours after the test. This is not dangerous. If this occurs, you can take Benadryl  25 mg, Zyrtec, Claritin , or Allegra and increase your fluid intake. (Patients taking Tikosyn should avoid Benadryl , and  may take Zyrtec, Claritin , or Allegra) If you experience trouble breathing, this can be serious. If it is severe call 911 IMMEDIATELY. If it is mild, please call our office.  We will call to schedule your test 2-4 weeks out understanding that some insurance companies will need an authorization prior to the service being performed.    For more information and frequently asked questions, please visit our website : [email or web address]  For non-scheduling related questions, please contact the cardiac imaging nurse navigator should you have any questions/concerns: Cardiac Imaging Nurse Navigators Direct Office Dial: [phone]   For scheduling needs, including cancellations and rescheduling, please call [place], [phone].    Follow-Up: At [facility], you and your health needs are our priority.  As part of our continuing mission to provide you with exceptional heart care, our providers are all part of one team.  This team includes your primary Cardiologist (physician) and Advanced Practice Providers or APPs (Physician Assistants and Nurse Practitioners) who all work together to provide you with the care you need, when you need it.  Your next appointment:   3 month(s)  Provider:   You may see Dr. Junnie Olives or one of the following Advanced Practice Providers on your designated Care Team:   Laneta Pintos, NP Gildardo Labrador, PA-C Varney Gentleman, PA-C Cadence Palouse, PA-C Ronald Cockayne, NP Morey Ar, NP    We recommend signing up for the patient portal called MyChart.  Sign up information is provided on this After Visit Summary.  MyChart is used to connect with patients for Virtual Visits (Telemedicine).  Patients are able to view lab/test results, encounter notes, upcoming appointments, etc.  Non-urgent messages can be sent to your provider as well.   To learn more about what you can do with MyChart, go to [email or web address].

## 2023-10-20 NOTE — Progress Notes (Signed)
 Cardiology Office Note:    Date:  10/20/2023   ID:  Kelly [name], DOB [birth year], MRN [id number]  PCP:  Kelly Gaw, MD   [facility] Providers Cardiologist:  None     Referring MD: Kelly Gaw, MD   Chief Complaint  Patient presents with   Follow-up    Patient to reestablish care  has been doing well with no complaints of chest pain, chest pressure, has had some SOB issues and beating hard, medciation reviewed verbally with patient    History of Present Illness:    Kelly [name] is a 83 y.o. female with a hx of hypertension, hyperlipidemia, diabetes, OSA on CPAP who presents with shortness of breath.  Endorses shortness of breath with minimal exertion over the past several months.  Denies chest pain.  Endorses not being as active as she once was.  Per daughter, patient not always compliant with her CPAP mask.  Denies dizziness, presyncope or syncope.  Also feels like her heart beat is more prominent with exertion.  Previously evaluated from a cardiac perspective back in 2018.  Had symptoms of dyspnea.  Echo and Lexiscan  Myoview  at the time showed no significant abnormalities.  Prior notes/testing Echo 2018 EF 65 to 70% Lexiscan  Myoview  2018 no significant ischemia, low risk study.  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     Past Surgical History:  Procedure Laterality Date   APPENDECTOMY     BREAST BIOPSY Right 06/19/2018   affirm stereo/x clip/COLUMNAR CELL CHANGE WITH MICROCALCIFICATIONS. FIBROCYSTIC CHANGES WITH MICROCALCIFICATIONS   cataract  03/2017   CHOLECYSTECTOMY     COLONOSCOPY     COLONOSCOPY WITH PROPOFOL  N/A 08/22/2017   Procedure: COLONOSCOPY WITH PROPOFOL ;   Surgeon: Luke Salaam, MD;  Location[facility] ENDOSCOPY;  Service: Gastroenterology;  Laterality: N/A;   ESOPHAGOGASTRODUODENOSCOPY     ESOPHAGOGASTRODUODENOSCOPY (EGD) WITH PROPOFOL  N/A 03/23/2016   Procedure: ESOPHAGOGASTRODUODENOSCOPY (EGD) WITH PROPOFOL ;  Surgeon: Cassie Click, MD;  Location: [facility] ENDOSCOPY;  Service: Endoscopy;  Laterality: N/A;   FLEXIBLE SIGMOIDOSCOPY N/A 02/06/2018   Procedure: FLEXIBLE SIGMOIDOSCOPY;  Surgeon: Luke Salaam, MD;  Location: [facility] ENDOSCOPY;  Service: Gastroenterology;  Laterality: N/A;   FLEXIBLE SIGMOIDOSCOPY N/A 12/03/2018   Procedure: FLEXIBLE SIGMOIDOSCOPY;  Surgeon: Luke Salaam, MD;  Location: [facility] ENDOSCOPY;  Service: Gastroenterology;  Laterality: N/A;   RECTAL PROLAPSE REPAIR  2012   x2   skin cancer removed from hand  2024    Current Medications: Current Meds  Medication Sig   metoprolol tartrate (LOPRESSOR) 100 MG tablet TAKE 1 TABLET 2 HR PRIOR TO CARDIAC PROCEDURE   Current Facility-Administered Medications for the 10/20/23 encounter (Office Visit) with Constancia Delton, MD  Medication   denosumab  (PROLIA ) injection 60 mg   denosumab  (PROLIA ) injection 60 mg   [START ON 04/16/2024] denosumab  (PROLIA ) injection 60 mg     Allergies:   Prilosec otc [omeprazole magnesium], Sucralfate , Amoxicillin , and Morphine and codeine   Social History   Socioeconomic History   Marital status: Widowed    Spouse name: Not on file   Number of children: Not on file   Years of education: Not on file  Highest education level: High school graduate  Occupational History   Occupation: retired   Tobacco Use   Smoking status: Never   Smokeless tobacco: Never  Vaping Use   Vaping status: Never Used  Substance and Sexual Activity   Alcohol use: No   Drug use: No   Sexual activity: Not Currently    Partners: Male  Other Topics Concern   Not on file  Social History Narrative   widow   Social Drivers of Health   Financial Resource Strain: Low  Risk  (02/13/2023)   Overall Financial Resource Strain (CARDIA)    Difficulty of Paying Living Expenses: Not hard at all  Food Insecurity: No Food Insecurity (02/13/2023)   Hunger Vital Sign    Worried About Running Out of Food in the Last Year: Never true    Ran Out of Food in the Last Year: Never true  Transportation Needs: No Transportation Needs (02/13/2023)   PRAPARE - Administrator, Civil Service (Medical): No    Lack of Transportation (Non-Medical): No  Physical Activity: Sufficiently Active (02/13/2023)   Exercise Vital Sign    Days of Exercise per Week: 2 days    Minutes of Exercise per Session: 120 min  Stress: No Stress Concern Present (02/13/2023)   [employer] of Occupational Health - Occupational Stress Questionnaire    Feeling of Stress : Not at all  Social Connections: Moderately Integrated (02/13/2023)   Social Connection and Isolation Panel    Frequency of Communication with Friends and Family: More than three times a week    Frequency of Social Gatherings with Friends and Family: Three times a week    Attends Religious Services: More than 4 times per year    Active Member of Clubs or Organizations: Yes    Attends Banker Meetings: More than 4 times per year    Marital Status: Widowed     Family History: The patient's family history includes Aortic aneurysm in her sister; Arthritis in her mother; Breast cancer (age of onset: 91) in her sister; Heart attack (age of onset: 38) in her father; Lung cancer in her sister; Stroke in her mother.  ROS:   Please see the history of present illness.     All other systems reviewed and are negative.  EKGs/Labs/Other Studies Reviewed:    The following studies were reviewed today:  EKG Interpretation Date/Time:  Friday October 20 2023 15:45:54 EDT Ventricular Rate:  65 PR Interval:  178 QRS Duration:  70 QT Interval:  390 QTC Calculation: 405 R Axis:   45  Text Interpretation: Normal sinus  rhythm with sinus arrhythmia Normal ECG Confirmed by Constancia Delton ([id number]) on 10/20/2023 3:55:25 PM    Recent Labs: 09/13/2023: ALT 10; BUN 28; Creatinine, Ser 2.05; Potassium 4.2; Sodium 139  Recent Lipid Panel    Component Value Date/Time   CHOL 163 09/13/2023 0935   TRIG 183.0 (H) 09/13/2023 0935   HDL 28.40 (L) 09/13/2023 0935   CHOLHDL 6 09/13/2023 0935   VLDL 36.6 09/13/2023 0935   LDLCALC 98 09/13/2023 0935   LDLDIRECT 93.0 04/21/2023 1324     Risk Assessment/Calculations:             Physical Exam:    VS:  BP (!) 120/58 (BP Location: Right Arm, Patient Position: Sitting, Cuff Size: Normal)   Pulse 65   Ht 5' 3 (1.6 m)   Wt 138 lb (62.6 kg)   SpO2 96%   BMI 24.45 kg/m  Wt Readings from Last 3 Encounters:  10/20/23 138 lb (62.6 kg)  09/20/23 136 lb 8 oz (61.9 kg)  08/21/23 137 lb (62.1 kg)     GEN:  Well nourished, well developed in no acute distress HEENT: Normal NECK: No JVD; No carotid bruits CARDIAC: RRR, no murmurs, rubs, gallops RESPIRATORY:  Clear to auscultation without rales, wheezing or rhonchi  ABDOMEN: Soft, non-tender, non-distended MUSCULOSKELETAL:  No edema; No deformity  SKIN: Warm and dry NEUROLOGIC:  Alert and oriented x 3 PSYCHIATRIC:  Normal affect   ASSESSMENT:    1. DOE (dyspnea on exertion)   2. Primary hypertension   3. Mixed hyperlipidemia   4. Angina of effort (HCC)    PLAN:    In order of problems listed above:  Dyspnea on exertion, this could be an anginal equivalent.  Although deconditioning, OSA with CPAP noncompliance also in differential.  Obtain echo, obtain coronary CTA. Hypertension, BP controlled, continue losartan  100 mg daily, Norvasc  10 mg daily. Hyperlipidemia, cholesterol reasonable .  Continue Crestor  10 mg daily.  Follow-up after cardiac testing.       Medication Adjustments/Labs and Tests Ordered: Current medicines are reviewed at length with the patient today.  Concerns regarding medicines are  outlined above.  Orders Placed This Encounter  Procedures   CT CORONARY MORPH W/CTA COR W/SCORE W/CA W/CM &/OR WO/CM   Basic metabolic panel with GFR   EKG [id number]   ECHOCARDIOGRAM COMPLETE   Meds ordered this encounter  Medications   metoprolol tartrate (LOPRESSOR) 100 MG tablet    Sig: TAKE 1 TABLET 2 HR PRIOR TO CARDIAC PROCEDURE    Dispense:  1 tablet    Refill:  0    Patient Instructions  Medication Instructions:  -take 100 mg of metoprolol two hours prior to CTA *If you need a refill on your cardiac medications before your next appointment, please call your pharmacy*  Lab Work: Your provider would like for you to have following labs drawn today BMP.    If you have labs (blood work) drawn today and your tests are completely normal, you will receive your results only by: MyChart Message (if you have MyChart) OR A paper copy in the mail If you have any lab test that is abnormal or we need to change your treatment, we will call you to review the results.  Testing/Procedures: Your physician has requested that you have an echocardiogram. Echocardiography is a painless test that uses sound waves to create images of your heart. It provides your doctor with information about the size and shape of your heart and how well your heart's chambers and valves are working.   You may receive an ultrasound enhancing agent through an IV if needed to better visualize your heart during the echo. This procedure takes approximately one hour.  There are no restrictions for this procedure.  This will take place at [address] [facility] [address] [facility]) [address], [place] [address]  Please note: We ask at that you not bring children with you during ultrasound (echo/ vascular) testing. Due to room size and safety concerns, children are not allowed in the ultrasound rooms during exams. Our front office staff cannot provide observation of children in our lobby area while testing is being conducted. An  adult accompanying a patient to their appointment will only be allowed in the ultrasound room at the discretion of the ultrasound technician under special circumstances. We apologize for any inconvenience.     Your cardiac CT will be scheduled  at:  [facility] [address] [place], [place] [address] [phone]  Please arrive 15 mins early for check-in and test prep.  There is spacious parking and easy access to the radiology department from the [facility] Heart and Vascular entrance. Please enter here and check-in with the desk attendant.    Please follow these instructions carefully (unless otherwise directed):  An IV will be required for this test and Nitroglycerin will be given.  Hold all erectile dysfunction medications at least 3 days (72 hrs) prior to test. (Ie viagra, cialis, sildenafil, tadalafil, etc)   On the Night Before the Test: Be sure to Drink plenty of water. Do not consume any caffeinated/decaffeinated beverages or chocolate 12 hours prior to your test. Do not take any antihistamines 12 hours prior to your test.  On the Day of the Test: Drink plenty of water until 1 hour prior to the test. Do not eat any food 1 hour prior to test. You may take your regular medications prior to the test.  Take metoprolol (Lopressor) two hours prior to test. If you take Furosemide /Hydrochlorothiazide/Spironolactone/Chlorthalidone, please HOLD on the morning of the test. Patients who wear a continuous glucose monitor MUST remove the device prior to scanning. FEMALES- please wear underwire-free bra if available, avoid dresses & tight clothing       After the Test: Drink plenty of water. After receiving IV contrast, you may experience a mild flushed feeling. This is normal. On occasion, you may experience a mild rash up to 24 hours after the test. This is not dangerous. If this occurs, you can take Benadryl  25 mg, Zyrtec, Claritin , or Allegra and increase your  fluid intake. (Patients taking Tikosyn should avoid Benadryl , and may take Zyrtec, Claritin , or Allegra) If you experience trouble breathing, this can be serious. If it is severe call 911 IMMEDIATELY. If it is mild, please call our office.  We will call to schedule your test 2-4 weeks out understanding that some insurance companies will need an authorization prior to the service being performed.   For more information and frequently asked questions, please visit our website : [email or web address]  For non-scheduling related questions, please contact the cardiac imaging nurse navigator should you have any questions/concerns: Cardiac Imaging Nurse Navigators Direct Office Dial: [phone]   For scheduling needs, including cancellations and rescheduling, please call [place], [phone].    Follow-Up: At [facility], you and your health needs are our priority.  As part of our continuing mission to provide you with exceptional heart care, our providers are all part of one team.  This team includes your primary Cardiologist (physician) and Advanced Practice Providers or APPs (Physician Assistants and Nurse Practitioners) who all work together to provide you with the care you need, when you need it.  Your next appointment:   3 month(s)  Provider:   You may see Dr. Junnie Olives or one of the following Advanced Practice Providers on your designated Care Team:   Laneta Pintos, NP Gildardo Labrador, PA-C Varney Gentleman, PA-C Cadence [place], PA-C Ronald Cockayne, NP Morey Ar, NP    We recommend signing up for the patient portal called MyChart.  Sign up information is provided on this After Visit Summary.  MyChart is used to connect with patients for Virtual Visits (Telemedicine).  Patients are able to view lab/test results, encounter notes, upcoming appointments, etc.  Non-urgent messages can be sent to your provider as well.   To learn more about what you can do with  MyChart, go to [email or web address].          Signed, Constancia Delton, MD  10/20/2023 4:49 PM    [facility]

## 2023-10-21 LAB — BASIC METABOLIC PANEL WITH GFR
BUN/Creatinine Ratio: 14 (ref 12–28)
BUN: 28 mg/dL — ABNORMAL HIGH (ref 8–27)
CO2: 20 mmol/L (ref 20–29)
Calcium: 8.6 mg/dL — ABNORMAL LOW (ref 8.7–10.3)
Chloride: 106 mmol/L (ref 96–106)
Creatinine, Ser: 1.95 mg/dL — ABNORMAL HIGH (ref 0.57–1.00)
Glucose: 126 mg/dL — ABNORMAL HIGH (ref 70–99)
Potassium: 5 mmol/L (ref 3.5–5.2)
Sodium: 142 mmol/L (ref 134–144)
eGFR: 25 mL/min/{1.73_m2} — ABNORMAL LOW (ref >59)

## 2023-10-23 ENCOUNTER — Ambulatory Visit: Admitting: Cardiology

## 2023-11-07 ENCOUNTER — Encounter: Payer: Self-pay | Admitting: Nurse Practitioner

## 2023-11-07 ENCOUNTER — Ambulatory Visit (INDEPENDENT_AMBULATORY_CARE_PROVIDER_SITE_OTHER): Admitting: Nurse Practitioner

## 2023-11-07 VITALS — BP 126/64 | HR 68 | Ht 63.0 in | Wt 137.6 lb

## 2023-11-07 DIAGNOSIS — E782 Mixed hyperlipidemia: Secondary | ICD-10-CM

## 2023-11-07 DIAGNOSIS — E1122 Type 2 diabetes mellitus with diabetic chronic kidney disease: Secondary | ICD-10-CM | POA: Diagnosis not present

## 2023-11-07 DIAGNOSIS — N1832 Chronic kidney disease, stage 3b: Secondary | ICD-10-CM | POA: Diagnosis not present

## 2023-11-07 DIAGNOSIS — Z794 Long term (current) use of insulin: Secondary | ICD-10-CM

## 2023-11-07 DIAGNOSIS — I10 Essential (primary) hypertension: Secondary | ICD-10-CM | POA: Diagnosis not present

## 2023-11-07 LAB — POCT GLYCOSYLATED HEMOGLOBIN (HGB A1C): HbA1c, POC (controlled diabetic range): 10 % — AB (ref 0.0–7.0)

## 2023-11-07 MED ORDER — BD PEN NEEDLE NANO 2ND GEN 32G X 4 MM MISC: 6 refills | Status: DC

## 2023-11-07 MED ORDER — NOVOLIN 70/30 FLEXPEN (70-30) 100 UNIT/ML ~~LOC~~ SUPN: 10.0000 [IU] | PEN_INJECTOR | Freq: Two times a day (BID) | SUBCUTANEOUS | 3 refills | Status: DC

## 2023-11-07 NOTE — Progress Notes (Signed)
 Endocrinology Follow Up Note       11/07/2023, 4:50 PM   Subjective:    Patient ID: Kelly [name], female    DOB: [birth year].  Kelly [name] is being seen in follow up after being seen in consultation for management of currently uncontrolled symptomatic diabetes requested by  Hope Merle, MD.   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     Past Surgical History:  Procedure Laterality Date   APPENDECTOMY     BREAST BIOPSY Right 06/19/2018   affirm stereo/x clip/COLUMNAR CELL CHANGE WITH MICROCALCIFICATIONS. FIBROCYSTIC CHANGES WITH MICROCALCIFICATIONS   cataract  03/2017   CHOLECYSTECTOMY     COLONOSCOPY     COLONOSCOPY WITH PROPOFOL  N/A 08/22/2017   Procedure: COLONOSCOPY WITH PROPOFOL ;  Surgeon: Therisa Bi, MD;  Location: [facility] ENDOSCOPY;  Service: Gastroenterology;  Laterality: N/A;   ESOPHAGOGASTRODUODENOSCOPY     ESOPHAGOGASTRODUODENOSCOPY (EGD) WITH PROPOFOL  N/A 03/23/2016   Procedure: ESOPHAGOGASTRODUODENOSCOPY (EGD) WITH PROPOFOL ;  Surgeon: Lamar ONEIDA Holmes, MD;  Location: [facility] ENDOSCOPY;  Service: Endoscopy;  Laterality: N/A;   FLEXIBLE SIGMOIDOSCOPY N/A 02/06/2018   Procedure: FLEXIBLE SIGMOIDOSCOPY;  Surgeon: Therisa Bi, MD;  Location: [facility] ENDOSCOPY;  Service: Gastroenterology;  Laterality: N/A;   FLEXIBLE SIGMOIDOSCOPY N/A 12/03/2018   Procedure: FLEXIBLE SIGMOIDOSCOPY;  Surgeon: Therisa Bi, MD;  Location: [facility] ENDOSCOPY;  Service: Gastroenterology;  Laterality: N/A;   RECTAL PROLAPSE REPAIR  2012   x2   skin cancer removed from hand  2024    Social History   Socioeconomic History   Marital status: Widowed     Spouse name: Not on file   Number of children: Not on file   Years of education: Not on file   Highest education level: High school graduate  Occupational History   Occupation: retired   Tobacco Use   Smoking status: Never   Smokeless tobacco: Never  Vaping Use   Vaping status: Never Used  Substance and Sexual Activity   Alcohol use: No   Drug use: No   Sexual activity: Not Currently    Partners: Male  Other Topics Concern   Not on file  Social History Narrative   widow   Social Drivers of Health   Financial Resource Strain: Low Risk  (02/13/2023)   Overall Financial Resource Strain (CARDIA)    Difficulty of Paying Living Expenses: Not hard at all  Food Insecurity: No Food Insecurity (02/13/2023)   Hunger Vital Sign    Worried About Running Out of Food in the Last Year: Never true    Ran Out of Food in the Last Year: Never true  Transportation Needs: No Transportation Needs (02/13/2023)   PRAPARE - Administrator, Civil Service (Medical): No    Lack of Transportation (Non-Medical): No  Physical Activity: Sufficiently Active (02/13/2023)   Exercise Vital Sign  Days of Exercise per Week: 2 days    Minutes of Exercise per Session: 120 min  Stress: No Stress Concern Present (02/13/2023)   [employer] of Occupational Health - Occupational Stress Questionnaire    Feeling of Stress : Not at all  Social Connections: Moderately Integrated (02/13/2023)   Social Connection and Isolation Panel    Frequency of Communication with Friends and Family: More than three times a week    Frequency of Social Gatherings with Friends and Family: Three times a week    Attends Religious Services: More than 4 times per year    Active Member of Clubs or Organizations: Yes    Attends Banker Meetings: More than 4 times per year    Marital Status: Widowed    Family History  Problem Relation Age of Onset   Stroke Mother    Arthritis Mother    Aortic aneurysm Sister     Lung cancer Sister    Heart attack Father 71   Breast cancer Sister 44    Outpatient Encounter Medications as of 11/07/2023  Medication Sig   insulin  isophane & regular human KwikPen (NOVOLIN 70/30 KWIKPEN) (70-30) 100 UNIT/ML KwikPen Inject 10 Units into the skin 2 (two) times daily before a meal.   [DISCONTINUED] Insulin  Glargine (BASAGLAR  KWIKPEN) 100 UNIT/ML Inject 14 Units into the skin at bedtime.   amLODipine  (NORVASC ) 10 MG tablet Take 1 tablet (10 mg total) by mouth daily.   aspirin  EC 81 MG tablet Take 81 mg by mouth daily.   calcitRIOL (ROCALTROL) 0.25 MCG capsule Take 0.25 mcg by mouth daily.   Calcium  Citrate-Vitamin D  315-5 MG-MCG TABS Take 1 tablet by mouth daily.   cephALEXin  (KEFLEX ) 500 MG capsule Take 500 mg by mouth 2 (two) times daily.   Continuous Glucose Sensor (FREESTYLE LIBRE 2 SENSOR) MISC Use to check glucose 4 times daily   cyanocobalamin  (VITAMIN B12) 1000 MCG tablet Take 1 tablet (1,000 mcg total) by mouth daily.   diclofenac (FLECTOR) 1.3 % PTCH 1 patch 2 (two) times daily.   furosemide  (LASIX ) 40 MG tablet Take 40 mg by mouth daily as needed.   glucosamine-chondroitin 500-400 MG tablet Take 1 tablet by mouth 3 (three) times daily.   HYDROcodone -acetaminophen  (NORCO/VICODIN) 5-325 MG tablet Take 1 tablet by mouth every 4 (four) hours as needed.   Insulin  Pen Needle (BD PEN NEEDLE NANO 2ND GEN) 32G X 4 MM MISC Use to inject insulin  twice daily   Lancets MISC Use up to 4 times daily to check blood sugars.   losartan  (COZAAR ) 100 MG tablet Take 100 mg by mouth daily.   Melatonin 5 MG CHEW Chew 5 mg by mouth at bedtime as needed.   metoprolol  tartrate (LOPRESSOR ) 100 MG tablet TAKE 1 TABLET 2 HR PRIOR TO CARDIAC PROCEDURE   NARCAN 4 MG/0.1ML LIQD nasal spray kit    ondansetron  (ZOFRAN -ODT) 4 MG disintegrating tablet Take by mouth.   potassium chloride (K-DUR) 10 MEQ tablet Take 10 mEq by mouth daily as needed.   rOPINIRole  (REQUIP ) 0.25 MG tablet Take 1 tablet  (0.25 mg total) by mouth at bedtime.   rosuvastatin  (CRESTOR ) 10 MG tablet Take 1 tablet (10 mg total) by mouth daily.   sertraline  (ZOLOFT ) 50 MG tablet Take 1 tablet (50 mg total) by mouth daily.   [DISCONTINUED] Insulin  Pen Needle (BD PEN NEEDLE NANO 2ND GEN) 32G X 4 MM MISC Use to monitor glucose twice daily   Facility-Administered Encounter Medications as of 11/07/2023  Medication   denosumab  (PROLIA ) injection 60 mg   denosumab  (PROLIA ) injection 60 mg   [START ON 04/16/2024] denosumab  (PROLIA ) injection 60 mg    ALLERGIES: Allergies  Allergen Reactions   Prilosec Otc [Omeprazole Magnesium] Other (See Comments)    Maybe cause of acute interstitial nephritis   Sucralfate  Other (See Comments)    Maybe cause of acute interstitial nephritis   Amoxicillin  Diarrhea   Morphine And Codeine Other (See Comments)    Chest pains Chest pains    VACCINATION STATUS: Immunization History  Administered Date(s) Administered   Fluad Quad(high Dose 65+) 02/25/2019, 03/02/2020, 05/17/2021, 04/08/2022   Hep B, Unspecified 06/08/2000, 07/11/2000   Influenza, High Dose Seasonal PF 02/24/2017, 02/28/2018   Influenza,inj,quad, With Preservative 02/19/2016   Moderna Sars-Covid-2 Vaccination 07/05/2019, 08/12/2019, 03/02/2020, 08/10/2020   PNEUMOCOCCAL CONJUGATE-20 07/02/2021   Rsv, Bivalent, Protein Subunit Rsvpref,pf [place]) 05/08/2023   Td 02/26/2019   Zoster Recombinant(Shingrix) 01/02/2020, 03/10/2020    Diabetes She presents for her follow-up diabetic visit. She has type 2 diabetes mellitus. Onset time: diagnosed at approx age of 12. Her disease course has been fluctuating. There are no hypoglycemic associated symptoms. There are no hypoglycemic complications. Diabetic complications include nephropathy. (gastroparesis) Risk factors for coronary artery disease include diabetes mellitus and post-menopausal. Current diabetic treatment includes insulin  injections and diet. She is compliant with  treatment most of the time. Her weight is fluctuating minimally. She is following a generally unhealthy diet. When asked about meal planning, she reported none. She has not had a previous visit with a dietitian. She rarely participates in exercise. Her home blood glucose trend is fluctuating dramatically. Her overall blood glucose range is >200 mg/dl. (She presents today with her CGM showing fluctuating glycemic profile overall with gross hyperglycemia primarily.  Her POCT A1c today is 10%, increasing slightly from last visit of 9.8%.   Analysis of her CGM shows TIR 12%, TAR 87%, TBR 1% with a GMI of 10%.  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  ) An ACE inhibitor/angiotensin II receptor blocker is being taken. She does not see a podiatrist.Eye exam is current.    Review of systems  Constitutional: + Minimally fluctuating body weight,  current Body mass index is 24.37 kg/m. , no fatigue, no subjective hyperthermia, no subjective hypothermia Eyes: no blurry vision, no xerophthalmia ENT: no sore throat, no nodules palpated in throat, no dysphagia/odynophagia, no hoarseness Cardiovascular: no chest pain, no shortness of breath, no palpitations, no leg swelling Respiratory: no cough, no shortness of breath Gastrointestinal: no nausea/vomiting/diarrhea Musculoskeletal: no muscle/joint aches Skin: no rashes, no hyperemia Neurological: no tremors, no numbness, no tingling, no dizziness Psychiatric: no depression, no anxiety  Objective:     BP 126/64   Pulse 68   Ht 5' 3 (1.6 m)   Wt 137 lb 9.6 oz (62.4 kg)   BMI 24.37 kg/m   Wt Readings from Last 3 Encounters:  11/07/23 137 lb 9.6 oz (62.4 kg)  10/20/23 138 lb (62.6 kg)  09/20/23 136 lb 8 oz (61.9 kg)     BP Readings from Last 3 Encounters:  11/07/23 126/64  10/20/23 (!) 120/58  09/20/23 (!) 142/68      Physical Exam- Limited  Constitutional:  Body mass index is 24.37 kg/m. , not in acute distress, normal state  of mind Eyes:  EOMI, no exophthalmos Musculoskeletal: no gross deformities, strength intact in all four extremities, no gross restriction of joint movements Skin:  no rashes, no hyperemia Neurological:  no tremor with outstretched hands   Diabetic Foot Exam - Simple   No data filed     CMP ( most recent) CMP     Component Value Date/Time   NA 142 10/20/2023 1629   NA 137 01/18/2013 1036   K 5.0 10/20/2023 1629   K 3.9 01/18/2013 1036   CL 106 10/20/2023 1629   CL 103 01/18/2013 1036   CO2 20 10/20/2023 1629   CO2 26 01/18/2013 1036   GLUCOSE 126 (H) 10/20/2023 1629   GLUCOSE 205 (H) 09/13/2023 0935   GLUCOSE 342 (H) 01/18/2013 1036   BUN 28 (H) 10/20/2023 1629   BUN 14 01/18/2013 1036   CREATININE 1.95 (H) 10/20/2023 1629   CREATININE 1.48 (H) 01/18/2013 1036   CALCIUM  8.6 (L) 10/20/2023 1629   CALCIUM  9.3 01/18/2013 1036   PROT 6.5 09/13/2023 0935   PROT 6.2 (L) 01/18/2013 1036   ALBUMIN 3.8 09/13/2023 0935   ALBUMIN 3.3 (L) 01/18/2013 1036   AST 14 09/13/2023 0935   AST 22 01/18/2013 1036   ALT 10 09/13/2023 0935   ALT 46 01/18/2013 1036   ALKPHOS 78 09/13/2023 0935   ALKPHOS 120 01/18/2013 1036   BILITOT 0.5 09/13/2023 0935   BILITOT 0.6 01/18/2013 1036   GFRNONAA 29 (L) 04/26/2022 1823   GFRNONAA 35 (L) 01/18/2013 1036   GFRAA 19 (L) 10/20/2019 1735   GFRAA 41 (L) 01/18/2013 1036     Diabetic Labs (most recent): Lab Results  Component Value Date   HGBA1C 10.0 (A) 11/07/2023   HGBA1C 9.8 (A) 08/02/2023   HGBA1C 9.6 (H) 04/21/2023   MICROALBUR 27.2 (H) 09/13/2023     Lipid Panel ( most recent) Lipid Panel     Component Value Date/Time   CHOL 163 09/13/2023 0935   TRIG 183.0 (H) 09/13/2023 0935   HDL 28.40 (L) 09/13/2023 0935   CHOLHDL 6 09/13/2023 0935   VLDL 36.6 09/13/2023 0935   LDLCALC 98 09/13/2023 0935   LDLDIRECT 93.0 04/21/2023 1324      Lab Results  Component Value Date   TSH 4.41 11/08/2021   TSH 3.93 03/22/2017   TSH 0.719  07/09/2016           Assessment & Plan:   1) Type 2 diabetes mellitus with stage 3b chronic kidney disease, with long-term current use of insulin  (HCC)  She presents today with her CGM showing fluctuating glycemic profile overall with gross hyperglycemia primarily.  Her POCT A1c today is 10%, increasing slightly from last visit of 9.8%.   Analysis of her CGM shows TIR 12%, TAR 87%, TBR 1% with a GMI of 10%.  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   - Kelly [name] has currently uncontrolled symptomatic type 2 DM since 83 years of age.   -Recent labs reviewed.  - I had a long discussion with her about the progressive nature of diabetes and the pathology behind its complications. 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 These are all discussed in detail with her.  The following Lifestyle Medicine recommendations according to American College of Lifestyle Medicine [facility]) were discussed and offered to patient and she agrees to start the journey:  A. Whole Foods, Plant-based plate comprising of fruits and vegetables, plant-based proteins, whole-grain carbohydrates was discussed in detail with the patient.   A list for  source of those nutrients were also provided to the patient.  Patient will use only water or unsweetened tea for hydration. B.  The need to stay away from risky substances including alcohol, smoking; obtaining 7 to 9 hours of restorative sleep, at least 150 minutes of moderate intensity exercise weekly, the importance of healthy social connections,  and stress reduction techniques were discussed. C.  A full color page of  Calorie density of various food groups per pound showing examples of each food groups was provided to the patient.  - Nutritional counseling repeated at each appointment due to patients  tendency to fall back in to old habits.  - The patient admits there is a room for improvement in their diet and drink choices. -  Suggestion is made for the patient to avoid simple carbohydrates from their diet including Cakes, Sweet Desserts / Pastries, Ice Cream, Soda (diet and regular), Sweet Tea, Candies, Chips, Cookies, Sweet Pastries, Store Bought Juices, Alcohol in Excess of 1-2 drinks a day, Artificial Sweeteners, Coffee Creamer, and Sugar-free Products. This will help patient to have stable blood glucose profile and potentially avoid unintended weight gain.   - I encouraged the patient to switch to unprocessed or minimally processed complex starch and increased protein intake (animal or plant source), fruits, and vegetables.   - Patient is advised to stick to a routine mealtimes to eat 3 meals a day and avoid unnecessary snacks (to snack only to correct hypoglycemia).  - I have approached her with the following individualized plan to manage her diabetes and patient agrees:   -Avoiding hypoglycemia is a top priority in her care.  A good goal A1c for her would be 7%.  -Will change her regimen to help cover spikes in glucose.  Will change her to premixed insulin  70/30 10 units SQ twice daily with breakfast and supper if glucose is above 90 and she is eating.  She can continue the Basaglar  14 units at bedtime until she receives her new insulin  in the mail, then she will stop it.   -she is encouraged to continue monitoring glucose at least 4 times daily (using her CGM), before meals and before bed, and to call the clinic if she has readings less than 70 or above 300 for 3 tests in a row.  - she is warned not to take insulin  without proper monitoring per orders. - Adjustment parameters are given to her for hypo and hyperglycemia in writing.  -She is not an ideal candidate for incretin therapy due to body habitus with BMI of 22.  She is also not an ideal candidate for SGLT2i due to CKD stage  4.  - Specific targets for  A1c; LDL, HDL, and Triglycerides were discussed with the patient.  2) Blood Pressure /Hypertension:  her blood pressure is controlled to target for her age.   she is advised to continue her current medications including Losartan  50 mg p.o. daily with breakfast, and Norvasc  10 mg po daily.  She also takes Lasix  40 po daily as needed for fluid.  She does see nephrology, will defer any med changes to them.  3) Lipids/Hyperlipidemia:    Review of her recent lipid panel from 07/07/22 showed controlled LDL at 74 .  she is advised to continue Crestor  10 mg daily at bedtime.  Side effects and precautions discussed with her.  4)  Weight/Diet:  her Body mass index is 24.37 kg/m.  -    she is NOT a candidate for weight loss.  Exercise, and detailed carbohydrates information provided  -  detailed on discharge instructions.  5) Chronic Care/Health Maintenance: -she is on ACEI/ARB and Statin medications and is encouraged to initiate and continue to follow up with Ophthalmology, Dentist, Podiatrist at least yearly or according to recommendations, and advised to stay away from smoking. I have recommended yearly flu vaccine and pneumonia vaccine at least every 5 years; moderate intensity exercise for up to 150 minutes weekly; and sleep for at least 7 hours a day.  - she is advised to maintain close follow up with Hope Merle, MD for primary care needs, as well as her other providers for optimal and coordinated care.     I spent  28  minutes in the care of the patient today including review of labs from CMP, Lipids, Thyroid  Function, Hematology (current and previous including abstractions from other facilities); face-to-face time discussing  her blood glucose readings/logs, discussing hypoglycemia and hyperglycemia episodes and symptoms, medications doses, her options of short and long term treatment based on the latest standards of care / guidelines;  discussion about incorporating  lifestyle medicine;  and documenting the encounter. Risk reduction counseling performed per USPSTF guidelines to reduce obesity and cardiovascular risk factors.     Please refer to Patient Instructions for Blood Glucose Monitoring and Insulin /Medications Dosing Guide  in media tab for additional information. Please  also refer to  Patient Self Inventory in the Media  tab for reviewed elements of pertinent patient history.  Kelly [name] participated in the discussions, expressed understanding, and voiced agreement with the above plans.  All questions were answered to her satisfaction. she is encouraged to contact clinic should she have any questions or concerns prior to her return visit.     Follow up plan: - Return in about 3 months (around 02/07/2024) for Diabetes F/U with A1c in office, No previsit labs, Bring meter and logs.  Benton Rio, [facility] [address] [place], [place] [address] Phone: [phone] Fax: [phone]  11/07/2023, 4:50 PM

## 2023-11-08 ENCOUNTER — Ambulatory Visit: Attending: Cardiology

## 2023-11-08 ENCOUNTER — Telehealth: Payer: Self-pay | Admitting: Nurse Practitioner

## 2023-11-08 DIAGNOSIS — R0609 Other forms of dyspnea: Secondary | ICD-10-CM | POA: Diagnosis not present

## 2023-11-08 LAB — ECHOCARDIOGRAM COMPLETE
AR max vel: 2.34 cm2
AV Area VTI: 2.65 cm2
AV Area mean vel: 2.36 cm2
AV Mean grad: 4 mmHg
AV Peak grad: 6.8 mmHg
Ao pk vel: 1.3 m/s
Area-P 1/2: 3.21 cm2
Calc EF: 60.4 %
S' Lateral: 1.6 cm
Single Plane A2C EF: 63.1 %
Single Plane A4C EF: 61.6 %

## 2023-11-08 NOTE — Telephone Encounter (Signed)
 Pts daughter Clarita is asking for a call back about the changes she made for her mom and a supplement.  Pt is requesting a call back from [facility].  Check DPR.  Daughter should be on there.

## 2023-11-08 NOTE — Telephone Encounter (Signed)
 She notes she was talking with mom about her new insulin  regimen and states her mom said she would be taking 4 injections per day.  She also wanted me to be aware that she sometimes would forget to take her insulin  previously and is concerned for her safety with the new regimen, especially since she does not have a eating routine right now either.    Tammy, can you call her and make sure she understood the directions given yesterday?  She is to take premixed insulin  10 units SQ twice daily (with breakfast and supper) which means she actually needs to eat breakfast and supper.  She does NOT take the Basaglar  in addition to the new insulin .  That one she will stop completely (and she was nearly out of it anyway from what she told me yesterday).

## 2023-11-08 NOTE — Telephone Encounter (Signed)
 Talked with patient. She states that her daughter just talked to her and she understands now. She is to check blood sugars 4 times daily, and take her new insulin  10 units twice a day, breakfast and supper. I shared with the patient that she is NOT to take anymore Basaglar  . She said , oh, okay I will be getting my new medications tomorrow. She has the paper work and plans to put in on her refrigerator , so that she can see it. She said, I understand, now.

## 2023-11-09 NOTE — Telephone Encounter (Signed)
 Very good, thank you

## 2023-11-13 ENCOUNTER — Ambulatory Visit: Payer: Self-pay | Admitting: Cardiology

## 2023-11-13 DIAGNOSIS — R0609 Other forms of dyspnea: Secondary | ICD-10-CM

## 2023-11-13 NOTE — Telephone Encounter (Signed)
 Called patient to go over information on echo. Pt verbalized understanding of information and all, if any questions were answered at this time.

## 2023-11-15 ENCOUNTER — Telehealth: Payer: Self-pay

## 2023-11-15 NOTE — Telephone Encounter (Signed)
 Pt aware that CT was canceled.

## 2023-11-15 NOTE — Addendum Note (Signed)
 Addended by: BRIEN SALM on: 11/15/2023 09:33 AM   Modules accepted: Orders

## 2023-11-16 ENCOUNTER — Ambulatory Visit

## 2023-11-20 ENCOUNTER — Other Ambulatory Visit: Payer: Self-pay | Admitting: *Deleted

## 2023-11-20 DIAGNOSIS — N1832 Chronic kidney disease, stage 3b: Secondary | ICD-10-CM

## 2023-11-20 DIAGNOSIS — Z794 Long term (current) use of insulin: Secondary | ICD-10-CM

## 2023-11-20 DIAGNOSIS — N184 Chronic kidney disease, stage 4 (severe): Secondary | ICD-10-CM

## 2023-11-20 MED ORDER — FREESTYLE LIBRE 3 PLUS SENSOR MISC: 6 refills | Status: DC

## 2023-11-20 MED ORDER — FREESTYLE LIBRE 3 READER DEVI: Status: AC

## 2023-11-20 NOTE — Telephone Encounter (Signed)
 Patient has had a Free style Libre 2 for a long time,years, she dropped it and it still  works but the plastic is coming up on it. She would like to see if she could upgrade to a [employer] 3 and the 3+ senors. A  prescription will be sent in to the [facility] . Patient was called and made aware. I did suggest her call the [facility] and share about the [place] 2 as they may replace it.

## 2023-12-04 ENCOUNTER — Other Ambulatory Visit: Payer: Self-pay | Admitting: *Deleted

## 2023-12-04 DIAGNOSIS — E1122 Type 2 diabetes mellitus with diabetic chronic kidney disease: Secondary | ICD-10-CM

## 2023-12-04 DIAGNOSIS — Z794 Long term (current) use of insulin: Secondary | ICD-10-CM

## 2023-12-04 MED ORDER — FREESTYLE LIBRE 3 PLUS SENSOR MISC: 8 refills | Status: DC

## 2023-12-05 ENCOUNTER — Other Ambulatory Visit: Payer: Self-pay | Admitting: Physician Assistant

## 2023-12-05 ENCOUNTER — Ambulatory Visit
Admission: RE | Admit: 2023-12-05 | Discharge: 2023-12-05 | Disposition: A | Discharge status: Home / Self Care | Source: Ambulatory Visit | Attending: Cardiology | Admitting: Cardiology

## 2023-12-05 DIAGNOSIS — R0609 Other forms of dyspnea: Secondary | ICD-10-CM | POA: Diagnosis not present

## 2023-12-05 LAB — NM MYOCAR MULTI W/SPECT W/WALL MOTION / EF
LV dias vol: 38 mL (ref 46–106)
LV sys vol: 10 mL (ref 3.8–5.2)
MPHR: 138 {beats}/min
Nuc Stress EF: 74 %
Peak HR: 80 {beats}/min
Percent HR: 57 %
Rest HR: 54 {beats}/min
Rest Nuclear Isotope Dose: 10.6 mCi
SDS: 0
SRS: 0
SSS: 0
ST Depression (mm): 0 mm
Stress Nuclear Isotope Dose: 33.1 mCi
TID: 1.05

## 2023-12-05 MED ORDER — TECHNETIUM TC 99M TETROFOSMIN IV KIT
33.0700 | PACK | Freq: Once | INTRAVENOUS | Status: AC | PRN
  Administered 2023-12-05: 33.07 via INTRAVENOUS

## 2023-12-05 MED ORDER — TECHNETIUM TC 99M TETROFOSMIN IV KIT
10.0000 | PACK | Freq: Once | INTRAVENOUS | Status: AC | PRN
  Administered 2023-12-05: 10.64 via INTRAVENOUS

## 2023-12-05 MED ORDER — REGADENOSON 0.4 MG/5ML IV SOLN
0.4000 mg | Freq: Once | INTRAVENOUS | Status: AC
  Administered 2023-12-05: 0.4 mg via INTRAVENOUS

## 2023-12-05 NOTE — Progress Notes (Signed)
     Kelly [name] presented for a nuclear stress test today.  I Lesley LITTIE Maffucci, PA-C, provided direct supervision and was present during the stress portion of the study today, which was completed without significant symptoms, immediate complications, or acute ST/T changes on ECG.  Stress imaging is pending at this time.  Preliminary ECG findings may be listed in the chart, but the stress test result will not be finalized until perfusion imaging is complete.  Lesley LITTIE Maffucci, PA-C  12/05/2023, 9:59 AM

## 2023-12-06 ENCOUNTER — Ambulatory Visit (INDEPENDENT_AMBULATORY_CARE_PROVIDER_SITE_OTHER)

## 2023-12-06 ENCOUNTER — Ambulatory Visit

## 2023-12-06 ENCOUNTER — Ambulatory Visit: Payer: Self-pay | Admitting: Cardiology

## 2023-12-06 VITALS — BP 112/72 | HR 61 | Temp 98.6°F | Ht 63.0 in | Wt 141.8 lb

## 2023-12-06 DIAGNOSIS — E1169 Type 2 diabetes mellitus with other specified complication: Secondary | ICD-10-CM

## 2023-12-06 DIAGNOSIS — L853 Xerosis cutis: Secondary | ICD-10-CM | POA: Insufficient documentation

## 2023-12-06 DIAGNOSIS — E785 Hyperlipidemia, unspecified: Secondary | ICD-10-CM | POA: Diagnosis not present

## 2023-12-06 DIAGNOSIS — F39 Unspecified mood [affective] disorder: Secondary | ICD-10-CM | POA: Diagnosis not present

## 2023-12-06 DIAGNOSIS — I152 Hypertension secondary to endocrine disorders: Secondary | ICD-10-CM | POA: Diagnosis not present

## 2023-12-06 DIAGNOSIS — G2581 Restless legs syndrome: Secondary | ICD-10-CM

## 2023-12-06 DIAGNOSIS — E538 Deficiency of other specified B group vitamins: Secondary | ICD-10-CM

## 2023-12-06 DIAGNOSIS — M810 Age-related osteoporosis without current pathological fracture: Secondary | ICD-10-CM

## 2023-12-06 DIAGNOSIS — E1159 Type 2 diabetes mellitus with other circulatory complications: Secondary | ICD-10-CM

## 2023-12-06 DIAGNOSIS — N184 Chronic kidney disease, stage 4 (severe): Secondary | ICD-10-CM | POA: Diagnosis not present

## 2023-12-06 DIAGNOSIS — E119 Type 2 diabetes mellitus without complications: Secondary | ICD-10-CM

## 2023-12-06 DIAGNOSIS — E118 Type 2 diabetes mellitus with unspecified complications: Secondary | ICD-10-CM | POA: Diagnosis not present

## 2023-12-06 LAB — BASIC METABOLIC PANEL WITH GFR
BUN: 27 mg/dL — ABNORMAL HIGH (ref 6–23)
CO2: 27 meq/L (ref 19–32)
Calcium: 8.7 mg/dL (ref 8.4–10.5)
Chloride: 106 meq/L (ref 96–112)
Creatinine, Ser: 2.07 mg/dL — ABNORMAL HIGH (ref 0.40–1.20)
GFR: 21.91 mL/min — ABNORMAL LOW (ref >60.00)
Glucose, Bld: 286 mg/dL — ABNORMAL HIGH (ref 70–99)
Potassium: 4.8 meq/L (ref 3.5–5.1)
Sodium: 137 meq/L (ref 135–145)

## 2023-12-06 LAB — B12 AND FOLATE PANEL
Folate: 19.4 ng/mL (ref >5.9)
Vitamin B-12: 486 pg/mL (ref 211–911)

## 2023-12-06 MED ORDER — SERTRALINE HCL 50 MG PO TABS
50.0000 mg | ORAL_TABLET | Freq: Every day | ORAL | 3 refills | Status: DC
Start: 2023-12-06 — End: 2024-03-07

## 2023-12-06 MED ORDER — AMLODIPINE BESYLATE 10 MG PO TABS: 10.0000 mg | ORAL_TABLET | Freq: Every day | ORAL | 3 refills | Status: AC

## 2023-12-06 MED ORDER — ROSUVASTATIN CALCIUM 10 MG PO TABS: 10.0000 mg | ORAL_TABLET | Freq: Every day | ORAL | 3 refills | Status: AC

## 2023-12-06 NOTE — Assessment & Plan Note (Signed)
 Normal exam finding. Recommend patient inspect skin (feet) daily for signs of skin cracking, infection.

## 2023-12-06 NOTE — Assessment & Plan Note (Signed)
 Managed well on current medication.  Denies SI/HI -Continue Zoloft 50 mg daily

## 2023-12-06 NOTE — Assessment & Plan Note (Signed)
-   Patient reports she never started taking ropinirole  0.25 mg at bedtime that was prescribed by previous PCP. Asymptomatic. Continue to monitor for symptoms exacerbation. Will need evaluation if she starts to have RLL symptoms.

## 2023-12-06 NOTE — Assessment & Plan Note (Signed)
-   Most recent A1c of 10%.  - Follow up with endocrinologist for insulin  adjustment. Appointment scheduled for 12/07/23. - Reduce soda intake recommended.  - Ensure adequate hydration.

## 2023-12-06 NOTE — Assessment & Plan Note (Signed)
-  BP within goal today, continue Amlodipine  10 mg daily. Continue Losartan  100 mg daily. Check BMP

## 2023-12-06 NOTE — Assessment & Plan Note (Signed)
 Continue Prolia  twice a year. Last Dexa 05/2023, repeat after 05/2025

## 2023-12-06 NOTE — Assessment & Plan Note (Signed)
 Check b12, if low despite being on oral B 12 1000 mcg supplement over the last month recommend B 12 IM injection. Patient agreeable, lab ordered.

## 2023-12-06 NOTE — Progress Notes (Signed)
 Established Patient Office Visit TOC from Dr. Hope    Subjective  Patient ID: Kelly [name], female    DOB: 1940/12/08  Age: 83 y.o. MRN: [id number]  Chief Complaint  Patient presents with   Establish Care   She  has a past medical history of Colon polyps, Depression, DM (diabetes mellitus), type 2 with renal complications (HCC), Hyperlipidemia, Hypertension, Kidney disease, Kidney stones, Shingles (12/05/2019), Sleep apnea, and Squamous cell skin cancer (12/2015).  HPI Discussed the use of AI scribe software for clinical note transcription with the patient, who gave verbal consent to proceed.  History of Present Illness Kelly [name] is an 83 year old female with uncontrolled diabetes with CKD, hyperglycemia, hypertension,  who presents to establish care.   - DM II with CKD: On insulin  per her endocrinologist, has appointment with her endocrinologist on 12/07/23 at 4 PM.  Her diet includes two regular Pepsis a day, which is not ideal for her condition. Established with nephrologist Dr. Dennise who she has been following every 3 months.   - Hypertension and is currently taking amlodipine  10 mg around lunchtime and losartan  100 mg daily. She recently underwent a stress test; she does not know the results yet. However, documentation from her cardiologist from this morning stating normal cardiac stress test.   - Osteoporosis and is on Prolia  injections twice a year. She also takes calcium  plus vitamin D  supplements and Calcitriol.  - Chronic back pain: Established with [facility] for upper and lower back pain for years. She experiences back pain due to deteriorating discs, for which she uses diclofenac as needed and Vicodin, averaging one pill a day.   - OSA: She has a history of sleep apnea and uses a CPAP machine every night. She occasionally uses melatonin for sleep. No current issues with restless leg syndrome and has not been taking the prescribed Ropinirole  medication  for it.  - She takes Crestor  10 mg for elevated LDL cholesterol.  -  Has been on Zoloft  50 mg daily for mood. No thoughts of self-harm or harm to others. She reports no alcohol consumption and lives alone, but her daughter is attentive and helps when needed.  - Has a h/o vitamin B 12 deficiency, has been taking OTC 1000 mcg B 12 oral supplement.   ROS As per HPI    Objective:     BP 112/72 (BP Location: Right Arm, Patient Position: Sitting, Cuff Size: Normal)   Pulse 61   Temp 98.6 F (37 C) (Oral)   Ht 5' 3 (1.6 m)   Wt 141 lb 12.8 oz (64.3 kg)   SpO2 94%   BMI 25.12 kg/m      12/06/2023    1:19 PM 07/21/2023    2:25 PM 06/22/2023    1:01 PM  Depression screen PHQ 2/9  Decreased Interest 0 0 0  Down, Depressed, Hopeless 0 0 0  PHQ - 2 Score 0 0 0  Altered sleeping 0  0  Tired, decreased energy 0  0  Change in appetite 0  0  Feeling bad or failure about yourself  0  0  Trouble concentrating 0  0  Moving slowly or fidgety/restless 0  0  Suicidal thoughts 0  0  PHQ-9 Score 0  0  Difficult doing work/chores Not difficult at all  Not difficult at all      12/06/2023    1:19 PM 07/21/2023    2:25 PM 06/22/2023    1:01 PM  04/21/2023    1:16 PM  GAD 7 : Generalized Anxiety Score  Nervous, Anxious, on Edge 0 0 0 0  Control/stop worrying 0 0 0 0  Worry too much - different things 0 0 0 0  Trouble relaxing 0 0 0 0  Restless 0 0 0 0  Easily annoyed or irritable 0 0 0 0  Afraid - awful might happen 0 0 0 0  Total GAD 7 Score 0 0 0 0  Anxiety Difficulty Not difficult at all Not difficult at all Not difficult at all Not difficult at all      12/06/2023    1:19 PM 07/21/2023    2:25 PM 06/22/2023    1:01 PM  Depression screen PHQ 2/9  Decreased Interest 0 0 0  Down, Depressed, Hopeless 0 0 0  PHQ - 2 Score 0 0 0  Altered sleeping 0  0  Tired, decreased energy 0  0  Change in appetite 0  0  Feeling bad or failure about yourself  0  0  Trouble concentrating 0  0   Moving slowly or fidgety/restless 0  0  Suicidal thoughts 0  0  PHQ-9 Score 0  0  Difficult doing work/chores Not difficult at all  Not difficult at all      12/06/2023    1:19 PM 07/21/2023    2:25 PM 06/22/2023    1:01 PM 04/21/2023    1:16 PM  GAD 7 : Generalized Anxiety Score  Nervous, Anxious, on Edge 0 0 0 0  Control/stop worrying 0 0 0 0  Worry too much - different things 0 0 0 0  Trouble relaxing 0 0 0 0  Restless 0 0 0 0  Easily annoyed or irritable 0 0 0 0  Afraid - awful might happen 0 0 0 0  Total GAD 7 Score 0 0 0 0  Anxiety Difficulty Not difficult at all Not difficult at all Not difficult at all Not difficult at all   SDOH Screenings   Food Insecurity: No Food Insecurity (02/13/2023)  Housing: Low Risk  (02/13/2023)  Transportation Needs: No Transportation Needs (02/13/2023)  Utilities: Not At Risk (02/13/2023)  Alcohol Screen: Low Risk  (02/13/2023)  Depression (PHQ2-9): Low Risk  (12/06/2023)  Financial Resource Strain: Low Risk  (02/13/2023)  Physical Activity: Sufficiently Active (02/13/2023)  Social Connections: Moderately Integrated (02/13/2023)  Stress: No Stress Concern Present (02/13/2023)  Tobacco Use: Low Risk  (12/06/2023)  Health Literacy: Adequate Health Literacy (02/13/2023)     Physical Exam Constitutional:      General: She is not in acute distress. HENT:     Head: Normocephalic and atraumatic.     Right Ear: Tympanic membrane normal.     Left Ear: Tympanic membrane normal.  Cardiovascular:     Rate and Rhythm: Normal rate.  Pulmonary:     Effort: Pulmonary effort is normal. No respiratory distress.     Breath sounds: Normal breath sounds. No wheezing.  Abdominal:     General: Bowel sounds are normal.     Palpations: Abdomen is soft.     Tenderness: There is no guarding.  Musculoskeletal:     Cervical back: Normal range of motion. No rigidity or tenderness.  Skin:    General: Skin is warm.     Comments: Xerosis of b/l forearms noted   Neurological:     Mental Status: She is alert and oriented to person, place, and time.  Psychiatric:  Mood and Affect: Mood normal.      Diabetic foot exam was performed with the following findings:   No deformities, ulcerations, or other skin breakdown Normal sensation of 10g monofilament Intact posterior tibialis and dorsalis pedis pulses     No results found for any visits on 12/06/23.  The ASCVD Risk score (Arnett DK, et al., 2019) failed to calculate for the following reasons:   The 2019 ASCVD risk score is only valid for ages 67 to 10     Assessment & Plan:    Vitamin B 12 deficiency Assessment & Plan: Check b12, if low despite being on oral B 12 1000 mcg supplement over the last month recommend B 12 IM injection. Patient agreeable, lab ordered.   Orders: -     B12 and Folate Panel  Hypertension associated with diabetes (HCC) Assessment & Plan: -BP within goal today, continue Amlodipine  10 mg daily. Continue Losartan  100 mg daily. Check BMP    Age-related osteoporosis without current pathological fracture Assessment & Plan: Continue Prolia  twice a year. Last Dexa 05/2023, repeat after 05/2025    Mood disorder [facility]) Assessment & Plan: Managed well on current medication.  Denies SI/HI. Continue Zoloft  50 mg daily.  Orders: -     Sertraline  HCl; Take 1 tablet (50 mg total) by mouth daily.  Dispense: 90 tablet; Refill: 3 -     Basic metabolic panel with GFR  Chronic kidney disease, stage 4 (severe) (HCC) Assessment & Plan: Continue f/u with nephrology. Check BMP. Medications to be renally dosed. Avoid nephrotoxic medications.    Type 2 diabetes mellitus with complications (HCC) Assessment & Plan: - Most recent A1c of 10%.  - Follow up with endocrinologist for insulin  adjustment. Appointment scheduled for 12/07/23. - Reduce soda intake recommended.  - Ensure adequate hydration.  Orders: -     amLODIPine  Besylate; Take 1 tablet (10 mg total) by  mouth daily.  Dispense: 90 tablet; Refill: 3  Hyperlipidemia associated with type 2 diabetes mellitus (HCC) Assessment & Plan: On Rosuvastatin  10 mg, previous lipid panel with LDL above goal of 70, continue current dose given patient's age, co-morbidities. Diet modification discussed.   Orders: -     Rosuvastatin  Calcium ; Take 1 tablet (10 mg total) by mouth daily.  Dispense: 90 tablet; Refill: 3  Xerosis of skin Assessment & Plan: Patient instructed to apply moisturizers daily, avoid harsh soaps.    Restless leg Assessment & Plan: - Patient reports she never started taking ropinirole  0.25 mg at bedtime that was prescribed by previous PCP. Asymptomatic. Continue to monitor for symptoms exacerbation. Will need evaluation if she starts to have RLL symptoms.    Encounter for diabetic foot exam [facility]) Assessment & Plan: Normal exam finding. Recommend patient inspect skin (feet) daily for signs of skin cracking, infection.    I spent 45 minutes on the day of this face-to-face encounter reviewing the patient's medical and surgical history, medications, ongoing concerns, and reviewing the assessment and plan with the patient. This time also included counseling the patient on their health conditions and management options. Additionally, I spent time post-visit ordering and reviewing diagnostics and therapeutics with the patient.   Return in about 4 months (around 04/07/2024) for Chronic follow up .   Luke Shade, MD

## 2023-12-06 NOTE — Assessment & Plan Note (Signed)
 Patient instructed to apply moisturizers daily, avoid harsh soaps.

## 2023-12-06 NOTE — Assessment & Plan Note (Signed)
 On Rosuvastatin  10 mg, previous lipid panel with LDL above goal of 70, continue current dose given patient's age, co-morbidities. Diet modification discussed.

## 2023-12-06 NOTE — Assessment & Plan Note (Signed)
 Continue f/u with nephrology. Check BMP. Medications to be renally dosed. Avoid nephrotoxic medications.

## 2023-12-07 ENCOUNTER — Ambulatory Visit: Payer: Self-pay

## 2023-12-07 ENCOUNTER — Ambulatory Visit: Admitting: Nurse Practitioner

## 2023-12-07 VITALS — BP 114/74 | HR 67 | Ht 63.0 in | Wt 143.4 lb

## 2023-12-07 DIAGNOSIS — Z794 Long term (current) use of insulin: Secondary | ICD-10-CM

## 2023-12-07 DIAGNOSIS — N1832 Chronic kidney disease, stage 3b: Secondary | ICD-10-CM

## 2023-12-07 NOTE — Consult Note (Signed)
 Patient presented to the office for instruction of the use of her Freestyle Libre 3 receiver. Vital signs obtained.   Patient was shown how to place a new sensor in her back of her upper left arm. We review the receiver and how to use this. We paired the sensor with the receiver. Patient was shown that it would be 1 hour before the sensor would be ready to start giving readings. This is one thing she did not understand, she thought that she was to scan her arm ever so many times. After we completed the review, the patient verbalized that she understood now.

## 2023-12-07 NOTE — Progress Notes (Signed)
 Please let the patient know I reviewed her lab results and have following recommendations (last my chart login 04/2023):  - B 12 is improved compared to her last visit. She should continue to take B 12 supplement and does not need B 12 injection.  - Calcium  is normalized.  - Kidney function is low but stable. I would continue close follow up with her nephrologist.  - Blood glucose is elevated and I recommend discussing this with her endocrinologist appointment later today (7/31).  Thank you,  Luke Shade, MD

## 2023-12-13 ENCOUNTER — Telehealth: Payer: Self-pay | Admitting: *Deleted

## 2023-12-13 DIAGNOSIS — E1122 Type 2 diabetes mellitus with diabetic chronic kidney disease: Secondary | ICD-10-CM | POA: Diagnosis not present

## 2023-12-13 DIAGNOSIS — N1832 Chronic kidney disease, stage 3b: Secondary | ICD-10-CM | POA: Diagnosis not present

## 2023-12-13 DIAGNOSIS — I10 Essential (primary) hypertension: Secondary | ICD-10-CM | POA: Diagnosis not present

## 2023-12-13 DIAGNOSIS — N2581 Secondary hyperparathyroidism of renal origin: Secondary | ICD-10-CM | POA: Diagnosis not present

## 2023-12-13 NOTE — Telephone Encounter (Signed)
 Patient left a message on 12/12/2023. She shared in her message that her blood sugars are still running high. She is injecting the Novolin  70/30. She states that her blood sugars are running in the upper and lower 300's.  Patient was called, she states that this morning at 10:03 am her blood sugar was 117. She is injecting the 10 units of Novolin  70/30 10 units in the morning and 10 units in the evening. She is rotating her injection sites, she does not notice any insulin  running out of the injection site after the administration of it. She has not been on any antibiotics, prednisone .  She admits that she sometime misses her breakfast meal because she gets up late, and she drinks a Pepsi sometimes. Patient was encouraged to eat three proper meals a day, drink water. She has a CGM but lives 45 minutes away to bring it in for a download.  Patient advised that we would call her back with Whitney's recommendation.

## 2023-12-13 NOTE — Telephone Encounter (Signed)
 Have her increase the morning dose to 15 units to see if we can prevent the spikes in the afternoon/evening.  Avoiding sweets (including sodas) will also help.

## 2023-12-13 NOTE — Telephone Encounter (Signed)
 Patient was called and made aware.

## 2023-12-19 DIAGNOSIS — Q762 Congenital spondylolisthesis: Secondary | ICD-10-CM | POA: Diagnosis not present

## 2023-12-19 DIAGNOSIS — T2101XA Burn of unspecified degree of chest wall, initial encounter: Secondary | ICD-10-CM | POA: Diagnosis not present

## 2023-12-19 DIAGNOSIS — M7062 Trochanteric bursitis, left hip: Secondary | ICD-10-CM | POA: Diagnosis not present

## 2023-12-19 DIAGNOSIS — M542 Cervicalgia: Secondary | ICD-10-CM | POA: Diagnosis not present

## 2023-12-19 DIAGNOSIS — E1165 Type 2 diabetes mellitus with hyperglycemia: Secondary | ICD-10-CM | POA: Diagnosis not present

## 2023-12-19 DIAGNOSIS — Z79899 Other long term (current) drug therapy: Secondary | ICD-10-CM | POA: Diagnosis not present

## 2023-12-19 DIAGNOSIS — M461 Sacroiliitis, not elsewhere classified: Secondary | ICD-10-CM | POA: Diagnosis not present

## 2023-12-19 DIAGNOSIS — M25572 Pain in left ankle and joints of left foot: Secondary | ICD-10-CM | POA: Diagnosis not present

## 2023-12-19 DIAGNOSIS — M5032 Other cervical disc degeneration, mid-cervical region, unspecified level: Secondary | ICD-10-CM | POA: Diagnosis not present

## 2023-12-19 DIAGNOSIS — M533 Sacrococcygeal disorders, not elsewhere classified: Secondary | ICD-10-CM | POA: Diagnosis not present

## 2023-12-19 DIAGNOSIS — M546 Pain in thoracic spine: Secondary | ICD-10-CM | POA: Diagnosis not present

## 2023-12-19 DIAGNOSIS — M47812 Spondylosis without myelopathy or radiculopathy, cervical region: Secondary | ICD-10-CM | POA: Diagnosis not present

## 2023-12-20 DIAGNOSIS — E1122 Type 2 diabetes mellitus with diabetic chronic kidney disease: Secondary | ICD-10-CM | POA: Diagnosis not present

## 2023-12-20 DIAGNOSIS — I10 Essential (primary) hypertension: Secondary | ICD-10-CM | POA: Diagnosis not present

## 2023-12-20 DIAGNOSIS — N1832 Chronic kidney disease, stage 3b: Secondary | ICD-10-CM | POA: Diagnosis not present

## 2023-12-20 DIAGNOSIS — N2581 Secondary hyperparathyroidism of renal origin: Secondary | ICD-10-CM | POA: Diagnosis not present

## 2023-12-20 DIAGNOSIS — R600 Localized edema: Secondary | ICD-10-CM | POA: Diagnosis not present

## 2024-01-01 ENCOUNTER — Telehealth: Payer: Self-pay | Admitting: *Deleted

## 2024-01-01 NOTE — Telephone Encounter (Signed)
 Patient was called as she called access nurse line on Friday afternoon. She is requesting that we send in 90 supply of insulin  for her. Which we have. She is injecting per medication list Novolin  70/30 10 units with breakfast, 10 units with supper. Then, in a note it is documented that the patient is to inject 15 units at breakfast then 10 units with supper. Patient was left a message on her voicemail at home and on her cell that we were wanting to verify the correct doses that she has been injecting, so that we may send in the correct 90 day supply.

## 2024-01-11 ENCOUNTER — Other Ambulatory Visit: Payer: Self-pay | Admitting: *Deleted

## 2024-01-11 DIAGNOSIS — Z794 Long term (current) use of insulin: Secondary | ICD-10-CM

## 2024-01-11 DIAGNOSIS — N1832 Chronic kidney disease, stage 3b: Secondary | ICD-10-CM

## 2024-01-11 DIAGNOSIS — E782 Mixed hyperlipidemia: Secondary | ICD-10-CM

## 2024-01-11 DIAGNOSIS — I10 Essential (primary) hypertension: Secondary | ICD-10-CM

## 2024-01-11 DIAGNOSIS — E1122 Type 2 diabetes mellitus with diabetic chronic kidney disease: Secondary | ICD-10-CM

## 2024-01-11 MED ORDER — NOVOLIN 70/30 FLEXPEN (70-30) 100 UNIT/ML ~~LOC~~ SUPN: PEN_INJECTOR | SUBCUTANEOUS | 0 refills | Status: DC

## 2024-01-11 MED ORDER — FREESTYLE LIBRE 3 PLUS SENSOR MISC: 0 refills | Status: DC

## 2024-01-23 ENCOUNTER — Ambulatory Visit: Attending: Cardiology | Admitting: Cardiology

## 2024-01-23 ENCOUNTER — Encounter: Payer: Self-pay | Admitting: Cardiology

## 2024-01-23 VITALS — BP 118/58 | HR 72 | Ht 63.0 in | Wt 141.6 lb

## 2024-01-23 DIAGNOSIS — I10 Essential (primary) hypertension: Secondary | ICD-10-CM | POA: Diagnosis not present

## 2024-01-23 DIAGNOSIS — R0609 Other forms of dyspnea: Secondary | ICD-10-CM | POA: Insufficient documentation

## 2024-01-23 NOTE — Progress Notes (Signed)
 Cardiology Office Note:    Date:  01/23/2024   ID:  Kelly [name], DOB Sep 25, 1940, MRN [id number]  PCP:  Kelly Bruckner, MD   [facility] Providers Cardiologist:  None     Referring MD: Kelly Merle, MD   Chief Complaint  Patient presents with   Follow-up    3 month follow up after echo/myoview  pt has been doing well with no complaints of chest pain, chest pressure or SOB, medciation reviewed verbally with patient    History of Present Illness:    Kelly [name] is a 83 y.o. female with a hx of hypertension, hyperlipidemia, diabetes, OSA on CPAP who presents for follow-up.  Patient was previously seen with shortness of breath.  Echocardiogram and Lexiscan  Myoview  was obtained to evaluate cardiac etiology.  Endorses being sedentary, also not compliant with CPAP.  Presents for cardiac testing results.  No new concerns at this time.   Prior notes/testing Echo 2018 EF 65 to 70% Lexiscan  Myoview  2018 no significant ischemia, low risk study.  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     Past Surgical History:  Procedure Laterality Date   APPENDECTOMY     BREAST BIOPSY Right 06/19/2018   affirm stereo/x clip/COLUMNAR CELL CHANGE WITH MICROCALCIFICATIONS. FIBROCYSTIC CHANGES WITH MICROCALCIFICATIONS   cataract  03/2017   CHOLECYSTECTOMY     COLONOSCOPY     COLONOSCOPY WITH PROPOFOL  N/A 08/22/2017   Procedure: COLONOSCOPY WITH PROPOFOL ;  Surgeon: Therisa Bi, MD;  Location: [facility] ENDOSCOPY;  Service: Gastroenterology;  Laterality: N/A;   ESOPHAGOGASTRODUODENOSCOPY     ESOPHAGOGASTRODUODENOSCOPY (EGD) WITH PROPOFOL  N/A 03/23/2016   Procedure: ESOPHAGOGASTRODUODENOSCOPY (EGD) WITH PROPOFOL ;   Surgeon: Lamar ONEIDA Holmes, MD;  Location: [facility] ENDOSCOPY;  Service: Endoscopy;  Laterality: N/A;   FLEXIBLE SIGMOIDOSCOPY N/A 02/06/2018   Procedure: FLEXIBLE SIGMOIDOSCOPY;  Surgeon: Therisa Bi, MD;  Location: [facility] ENDOSCOPY;  Service: Gastroenterology;  Laterality: N/A;   FLEXIBLE SIGMOIDOSCOPY N/A 12/03/2018   Procedure: FLEXIBLE SIGMOIDOSCOPY;  Surgeon: Therisa Bi, MD;  Location: [facility] ENDOSCOPY;  Service: Gastroenterology;  Laterality: N/A;   RECTAL PROLAPSE REPAIR  2012   x2   skin cancer removed from hand  2024    Current Medications: Current Meds  Medication Sig   amLODipine  (NORVASC ) 10 MG tablet Take 1 tablet (10 mg total) by mouth daily.   aspirin  EC 81 MG tablet Take 81 mg by mouth daily.   calcitRIOL (ROCALTROL) 0.25 MCG capsule Take 0.25 mcg by mouth daily.   Calcium  Citrate-Vitamin D  315-5 MG-MCG TABS Take 1 tablet by mouth daily.   Continuous Glucose Receiver (FREESTYLE LIBRE 3 READER) DEVI Use the Reader to monitor blood sugars as directed by provider.   Continuous Glucose Sensor (FREESTYLE LIBRE 3 PLUS SENSOR) MISC Use the Freestyle Libre Sensor to monitor bloodsugar readings as directed by provider.Change sensor every 15 days.   cyanocobalamin  (VITAMIN B12) 1000 MCG tablet Take 1 tablet (1,000 mcg total) by mouth daily.   diclofenac (FLECTOR) 1.3 % PTCH 1 patch 2 (two) times daily.   furosemide  (LASIX ) 40 MG tablet Take 40 mg by mouth daily as needed.   glucosamine-chondroitin 500-400 MG tablet Take 1 tablet  by mouth 3 (three) times daily.   HYDROcodone -acetaminophen  (NORCO/VICODIN) 5-325 MG tablet Take 1 tablet by mouth every 4 (four) hours as needed.   insulin  isophane & regular human KwikPen (NOVOLIN  70/30 KWIKPEN) (70-30) 100 UNIT/ML KwikPen Patient is to inject 15 units sq each morning at breakfast and inject 10 units each evening at supper as directed by the provider.   Insulin  Pen Needle (BD PEN NEEDLE NANO 2ND GEN) 32G X 4 MM MISC Use to inject insulin  twice daily    Lancets MISC Use up to 4 times daily to check blood sugars.   losartan  (COZAAR ) 100 MG tablet Take 100 mg by mouth daily.   Melatonin 5 MG CHEW Chew 5 mg by mouth at bedtime as needed.   NARCAN 4 MG/0.1ML LIQD nasal spray kit    ondansetron  (ZOFRAN -ODT) 4 MG disintegrating tablet Take by mouth.   potassium chloride (K-DUR) 10 MEQ tablet Take 10 mEq by mouth daily as needed.   rosuvastatin  (CRESTOR ) 10 MG tablet Take 1 tablet (10 mg total) by mouth daily.   Current Facility-Administered Medications for the 01/23/24 encounter (Office Visit) with Darliss Rogue, MD  Medication   denosumab  (PROLIA ) injection 60 mg   denosumab  (PROLIA ) injection 60 mg   [START ON 04/16/2024] denosumab  (PROLIA ) injection 60 mg     Allergies:   Prilosec otc [omeprazole magnesium], Sucralfate , Amoxicillin , and Morphine and codeine   Social History   Socioeconomic History   Marital status: Widowed    Spouse name: Not on file   Number of children: Not on file   Years of education: Not on file   Highest education level: High school graduate  Occupational History   Occupation: retired   Tobacco Use   Smoking status: Never   Smokeless tobacco: Never  Vaping Use   Vaping status: Never Used  Substance and Sexual Activity   Alcohol use: No   Drug use: No   Sexual activity: Not Currently    Partners: Male  Other Topics Concern   Not on file  Social History Narrative   widow   Social Drivers of Health   Financial Resource Strain: Low Risk  (02/13/2023)   Overall Financial Resource Strain (CARDIA)    Difficulty of Paying Living Expenses: Not hard at all  Food Insecurity: No Food Insecurity (02/13/2023)   Hunger Vital Sign    Worried About Running Out of Food in the Last Year: Never true    Ran Out of Food in the Last Year: Never true  Transportation Needs: No Transportation Needs (02/13/2023)   PRAPARE - Administrator, Civil Service (Medical): No    Lack of Transportation  (Non-Medical): No  Physical Activity: Sufficiently Active (02/13/2023)   Exercise Vital Sign    Days of Exercise per Week: 2 days    Minutes of Exercise per Session: 120 min  Stress: No Stress Concern Present (02/13/2023)   [employer] of Occupational Health - Occupational Stress Questionnaire    Feeling of Stress : Not at all  Social Connections: Moderately Integrated (02/13/2023)   Social Connection and Isolation Panel    Frequency of Communication with Friends and Family: More than three times a week    Frequency of Social Gatherings with Friends and Family: Three times a week    Attends Religious Services: More than 4 times per year    Active Member of Clubs or Organizations: Yes    Attends Banker Meetings: More than 4 times per year  Marital Status: Widowed     Family History: The patient's family history includes Aortic aneurysm in her sister; Arthritis in her mother; Breast cancer (age of onset: 21) in her sister; Heart attack (age of onset: 94) in her father; Lung cancer in her sister; Stroke in her mother.  ROS:   Please see the history of present illness.     All other systems reviewed and are negative.  EKGs/Labs/Other Studies Reviewed:    The following studies were reviewed today:       Recent Labs: 09/13/2023: ALT 10 12/06/2023: BUN 27; Creatinine, Ser 2.07; Potassium 4.8; Sodium 137  Recent Lipid Panel    Component Value Date/Time   CHOL 163 09/13/2023 0935   TRIG 183.0 (H) 09/13/2023 0935   HDL 28.40 (L) 09/13/2023 0935   CHOLHDL 6 09/13/2023 0935   VLDL 36.6 09/13/2023 0935   LDLCALC 98 09/13/2023 0935   LDLDIRECT 93.0 04/21/2023 1324     Risk Assessment/Calculations:             Physical Exam:    VS:  BP (!) 118/58 (BP Location: Left Arm, Patient Position: Sitting, Cuff Size: Normal)   Pulse 72   Ht 5' 3 (1.6 m)   Wt 141 lb 9.6 oz (64.2 kg)   SpO2 97%   BMI 25.08 kg/m     Wt Readings from Last 3 Encounters:  01/23/24 141  lb 9.6 oz (64.2 kg)  12/07/23 143 lb 6.4 oz (65 kg)  12/06/23 141 lb 12.8 oz (64.3 kg)     GEN:  Well nourished, well developed in no acute distress HEENT: Normal NECK: No JVD; No carotid bruits CARDIAC: RRR, no murmurs, rubs, gallops RESPIRATORY:  Clear to auscultation without rales, wheezing or rhonchi  ABDOMEN: Soft, non-tender, non-distended MUSCULOSKELETAL:  No edema; No deformity  SKIN: Warm and dry NEUROLOGIC:  Alert and oriented x 3 PSYCHIATRIC:  Normal affect   ASSESSMENT:    1. DOE (dyspnea on exertion)   2. Primary hypertension    PLAN:    In order of problems listed above:  Dyspnea on exertion.  Echo 7/25 EF 60 to 65%, Lexiscan  Myoview  7/25 no evidence of ischemia, low risk study.  Patient made aware of results, reassured from a cardiac perspective.  Deconditioning, OSA with CPAP noncompliance more likely etiology.   Hypertension, BP controlled, continue losartan  100 mg daily, Norvasc  10 mg daily.  Follow-up as needed.     Medication Adjustments/Labs and Tests Ordered: Current medicines are reviewed at length with the patient today.  Concerns regarding medicines are outlined above.  No orders of the defined types were placed in this encounter.  No orders of the defined types were placed in this encounter.   Patient Instructions  Medication Instructions:  Your physician recommends that you continue on your current medications as directed. Please refer to the Current Medication list given to you today.   *If you need a refill on your cardiac medications before your next appointment, please call your pharmacy*  Lab Work: No labs ordered today  If you have labs (blood work) drawn today and your tests are completely normal, you will receive your results only by: MyChart Message (if you have MyChart) OR A paper copy in the mail If you have any lab test that is abnormal or we need to change your treatment, we will call you to review the  results.  Testing/Procedures: No test ordered today   Follow-Up: At [facility], you and your health needs  are our priority.  As part of our continuing mission to provide you with exceptional heart care, our providers are all part of one team.  This team includes your primary Cardiologist (physician) and Advanced Practice Providers or APPs (Physician Assistants and Nurse Practitioners) who all work together to provide you with the care you need, when you need it.  Your next appointment:   As needed             Signed, Redell Cave, MD  01/23/2024 2:37 PM    [facility]

## 2024-01-23 NOTE — Patient Instructions (Signed)

## 2024-02-05 ENCOUNTER — Telehealth: Payer: Self-pay

## 2024-02-05 ENCOUNTER — Ambulatory Visit (INDEPENDENT_AMBULATORY_CARE_PROVIDER_SITE_OTHER)

## 2024-02-05 ENCOUNTER — Ambulatory Visit: Payer: Self-pay

## 2024-02-05 VITALS — BP 110/74 | HR 73 | Temp 98.1°F | Wt 143.8 lb

## 2024-02-05 DIAGNOSIS — L989 Disorder of the skin and subcutaneous tissue, unspecified: Secondary | ICD-10-CM

## 2024-02-05 MED ORDER — MUPIROCIN 2 % EX OINT: 1.0000 | TOPICAL_OINTMENT | Freq: Two times a day (BID) | CUTANEOUS | 1 refills | Status: AC

## 2024-02-05 MED ORDER — DOXYCYCLINE HYCLATE 100 MG PO TABS: 100.0000 mg | ORAL_TABLET | Freq: Two times a day (BID) | ORAL | 0 refills | Status: AC

## 2024-02-05 NOTE — Telephone Encounter (Signed)
 Copied from CRM #[id number]. Topic: Clinical - Medical Advice >> Feb 05, 2024 10:34 AM Anairis L wrote: Reason for CRM: Patient has a rash on left arm with open cuts from her scratching it, she is diabetic so daughter would like her to be seen before they head out of town Wednesday. She also reached out to her dermatologist no app available.   Please reach out to patient,thank you.

## 2024-02-05 NOTE — Assessment & Plan Note (Signed)
 Differential diagnosis  includes diabetic dermatopathy, stasis dermatitis, xerosis, post-inflammatory hyperpigmentation, prurigo nodularis,  secondary bacterial infection from excoriations.  - Keeping your blood sugar in your target range is the most important step. This helps prevent new spots and keeps your skin healthier overall.  Follow up with your endocrinologist as scheduled for diabetic management.  - Wash your skin gently with mild soap and lukewarm water. Pat your skin dry--don't rub. Use a fragrance-free moisturizer daily to prevent dryness. Avoid scratching or picking at the spots. Trim nails short.  - Try not to bump or injure your shins, as this can cause new spots. Wear long pants or shin guards if you are doing activities where you might bump your legs. - If you develop sign of infection: redness, warmth, pus, or worsening pain I recommend you take Doxycycline  100 mg twice a day for 7 days. Take it with tall glass of water and do not lay down for 30 minutes after taking this medication.Otherwise, start Mupirocin  ointment twice a day for the next 7 days to help prevent secondary bacterial infection.  Also recommend keeping the appointment with your dermatologist as it is for next week.

## 2024-02-05 NOTE — Telephone Encounter (Signed)
 Okay to schedule an appointment for acute visit on 02/05/24 at 4:30 PM.   Thank you,  Luke Shade, MD

## 2024-02-05 NOTE — Telephone Encounter (Signed)
 Noted

## 2024-02-05 NOTE — Progress Notes (Signed)
 Acute Office Visit  Subjective:    Patient ID: Kelly [name], female    DOB: 17-Sep-1940, 83 y.o.   MRN: [id number]  Chief Complaint  Patient presents with   Rash    HPI Discussed the use of AI scribe software for clinical note transcription with the patient, who gave verbal consent to proceed.  History of Present Illness Kelly [name] is an 83 year old female with diabetes who presents with a rash on her right shin and forearm. She is accompanied by her daughter, Clarita.   She has a rash on her right shin and forearm that itches intensely. Scratching often results in excoriation and occasional discharge. There have been no recent changes in personal care products or noted insect bites. No associated fever or chills.  She is under care of endocrinologist for diabetes management. Home blood glucose has been ranging in 250 to 300 mg/dL.Her current insulin  regimen includes 15 units of 70/30 insulin  in the morning and 10 units at night, though she sometimes misses doses or administers them irregularly due to her sleep schedule. She has appointment with dermatologist for next week.   Socially, she resides within city limits and does not spend much time outdoors, although she notes a high presence of mosquitoes this year. Her frequent out-of-state travel to care for her father impacts her diabetes management.   ROS As per HPI    Objective:    BP 110/74 (BP Location: Right Arm, Patient Position: Sitting, Cuff Size: Small)   Pulse 73   Temp 98.1 F (36.7 C) (Oral)   Wt 143 lb 12.8 oz (65.2 kg)   SpO2 97%   BMI 25.47 kg/m    Physical Exam Constitutional:      General: She is not in acute distress. HENT:     Head: Normocephalic.  Cardiovascular:     Rate and Rhythm: Normal rate.  Skin:    Findings: Rash (multiple excoriated, hyperkeratotic, hyperpigmented papules on right forearm, healed hyperpigmented eash on righ lower legs) present.  Neurological:     Mental  Status: She is alert and oriented to person, place, and time.     Gait: Gait normal.  Psychiatric:        Mood and Affect: Mood normal.     No results found for any visits on 02/05/24.     Assessment & Plan:  Skin lesions Assessment & Plan: Differential diagnosis  includes diabetic dermatopathy, stasis dermatitis, xerosis, post-inflammatory hyperpigmentation, prurigo nodularis,  secondary bacterial infection from excoriations.  - Keeping your blood sugar in your target range is the most important step. This helps prevent new spots and keeps your skin healthier overall.  Follow up with your endocrinologist as scheduled for diabetic management.  - Wash your skin gently with mild soap and lukewarm water. Pat your skin dry--don't rub. Use a fragrance-free moisturizer daily to prevent dryness. Avoid scratching or picking at the spots. Trim nails short.  - Try not to bump or injure your shins, as this can cause new spots. Wear long pants or shin guards if you are doing activities where you might bump your legs. - If you develop sign of infection: redness, warmth, pus, or worsening pain I recommend you take Doxycycline  100 mg twice a day for 7 days. Take it with tall glass of water and do not lay down for 30 minutes after taking this medication.Otherwise, start Mupirocin  ointment twice a day for the next 7 days to help prevent secondary bacterial infection.  Also recommend keeping the appointment with your dermatologist as it is for next week.    Other orders -     Doxycycline  Hyclate; Take 1 tablet (100 mg total) by mouth 2 (two) times daily for 7 days.  Dispense: 14 tablet; Refill: 0 -     Mupirocin ; Apply 1 Application topically in the morning and at bedtime for 14 days. To rash on forearm  Dispense: 14 g; Refill: 1    Return if symptoms worsen or fail to improve.  Luke Shade, MD

## 2024-02-05 NOTE — Patient Instructions (Addendum)
-   Keeping your blood sugar in your target range is the most important step. This helps prevent new spots and keeps your skin healthier overall.    - Wash your skin gently with mild soap and lukewarm water. Pat your skin dry--don't rub. Use a fragrance-free moisturizer daily to prevent dryness. Avoid scratching or picking at the spots. Trim nails short.   - Try not to bump or injure your shins, as this can cause new spots. Wear long pants or shin guards if you are doing activities where you might bump your legs.  - If you develop sign of infection: redness, warmth, pus, or worsening pain I recommend you take Doxycycline  100 mg twice a day for 7 days. Take it with tall glass of water and do not lay down for 30 minutes after taking this medication. Also recommend keeping the appointment with your dermatologist as it is.

## 2024-02-05 NOTE — Telephone Encounter (Signed)
 FYI Only or Action Required?: FYI only for provider.  Patient was last seen in primary care on 12/06/2023 by Abbey Bruckner, MD.  Called Nurse Triage reporting Pruritis.  Symptoms began several weeks ago.  Interventions attempted: Other: Lidocaine  lotion.  Symptoms are: gradually worsening.  Triage Disposition: See Physician Within 24 Hours  Patient/caregiver understands and will follow disposition?: Yes     Copied from CRM [id number]. Topic: Clinical - Red Word Triage >> Feb 05, 2024 11:02 AM Alfonso HERO wrote: Red Word that prompted transfer to Nurse Triage: Patient has been trying to get in to see Dr. Abbey she is having severe itching on her arms and legs and is a diabetic and has scratched up her arms till they are now sore. She wants to go to urgent care if she can't get an appt but urgent care isnt answering the phone when she calls. Reason for Disposition  Looks infected (e.g., spreading redness, pus)  Answer Assessment - Initial Assessment Questions Patient states she has some redness to the area and is concerned because she is a diabetic. Patient also concerned about a possible infection. Patient using lidocaine  lotion on area. Patient given information for the [facility] at [facility] due to no availability in office today. Patient states she will walk in due to inability to make an appointment.    1. DESCRIPTION: Describe the itching you are having. Where is it located?     Arms and legs  2. SEVERITY: How bad is it?      Severe  3. SCRATCHING: Are there any scratch marks? Bleeding?     Yes, she has some scratch marks on areas on R forearm  4. ONSET: When did the itching begin? (e.g., minutes, hours, days ago)     A few weeks  5. OTHER SYMPTOMS: Do you have any other symptoms? (e.g., fever, rash)     Denies a fever  Protocols used: Itching - Localized-A-AH

## 2024-02-05 NOTE — Telephone Encounter (Signed)
 Patient is scheduled with Dr Abbey today 4:30pm.

## 2024-02-14 DIAGNOSIS — M546 Pain in thoracic spine: Secondary | ICD-10-CM | POA: Diagnosis not present

## 2024-02-14 DIAGNOSIS — Z79899 Other long term (current) drug therapy: Secondary | ICD-10-CM | POA: Diagnosis not present

## 2024-02-14 DIAGNOSIS — M533 Sacrococcygeal disorders, not elsewhere classified: Secondary | ICD-10-CM | POA: Diagnosis not present

## 2024-02-14 DIAGNOSIS — M461 Sacroiliitis, not elsewhere classified: Secondary | ICD-10-CM | POA: Diagnosis not present

## 2024-02-14 DIAGNOSIS — Q762 Congenital spondylolisthesis: Secondary | ICD-10-CM | POA: Diagnosis not present

## 2024-02-14 DIAGNOSIS — T2101XA Burn of unspecified degree of chest wall, initial encounter: Secondary | ICD-10-CM | POA: Diagnosis not present

## 2024-02-14 DIAGNOSIS — M5032 Other cervical disc degeneration, mid-cervical region, unspecified level: Secondary | ICD-10-CM | POA: Diagnosis not present

## 2024-02-14 DIAGNOSIS — E1165 Type 2 diabetes mellitus with hyperglycemia: Secondary | ICD-10-CM | POA: Diagnosis not present

## 2024-02-14 DIAGNOSIS — M25559 Pain in unspecified hip: Secondary | ICD-10-CM | POA: Diagnosis not present

## 2024-02-14 DIAGNOSIS — M25572 Pain in left ankle and joints of left foot: Secondary | ICD-10-CM | POA: Diagnosis not present

## 2024-02-14 DIAGNOSIS — M47812 Spondylosis without myelopathy or radiculopathy, cervical region: Secondary | ICD-10-CM | POA: Diagnosis not present

## 2024-02-14 DIAGNOSIS — M7062 Trochanteric bursitis, left hip: Secondary | ICD-10-CM | POA: Diagnosis not present

## 2024-02-15 ENCOUNTER — Ambulatory Visit: Admitting: Nurse Practitioner

## 2024-02-27 ENCOUNTER — Ambulatory Visit: Admitting: Nurse Practitioner

## 2024-02-27 DIAGNOSIS — E782 Mixed hyperlipidemia: Secondary | ICD-10-CM

## 2024-02-27 DIAGNOSIS — I10 Essential (primary) hypertension: Secondary | ICD-10-CM

## 2024-02-27 DIAGNOSIS — N1832 Chronic kidney disease, stage 3b: Secondary | ICD-10-CM

## 2024-02-27 DIAGNOSIS — Z794 Long term (current) use of insulin: Secondary | ICD-10-CM

## 2024-03-07 ENCOUNTER — Telehealth: Payer: Self-pay

## 2024-03-07 ENCOUNTER — Ambulatory Visit: Admitting: Nurse Practitioner

## 2024-03-07 ENCOUNTER — Other Ambulatory Visit (HOSPITAL_COMMUNITY): Payer: Self-pay

## 2024-03-07 ENCOUNTER — Telehealth: Payer: Self-pay | Admitting: Nurse Practitioner

## 2024-03-07 ENCOUNTER — Encounter: Payer: Self-pay | Admitting: Nurse Practitioner

## 2024-03-07 VITALS — BP 126/74 | HR 72 | Ht 63.0 in | Wt 143.0 lb

## 2024-03-07 DIAGNOSIS — E782 Mixed hyperlipidemia: Secondary | ICD-10-CM

## 2024-03-07 DIAGNOSIS — Z794 Long term (current) use of insulin: Secondary | ICD-10-CM | POA: Diagnosis not present

## 2024-03-07 DIAGNOSIS — N1832 Chronic kidney disease, stage 3b: Secondary | ICD-10-CM | POA: Diagnosis not present

## 2024-03-07 DIAGNOSIS — I10 Essential (primary) hypertension: Secondary | ICD-10-CM

## 2024-03-07 DIAGNOSIS — E1122 Type 2 diabetes mellitus with diabetic chronic kidney disease: Secondary | ICD-10-CM

## 2024-03-07 LAB — POCT GLYCOSYLATED HEMOGLOBIN (HGB A1C): Hemoglobin A1C: 9.8 % — AB (ref 4.0–5.6)

## 2024-03-07 MED ORDER — BD PEN NEEDLE NANO 2ND GEN 32G X 4 MM MISC: 3 refills | Status: AC

## 2024-03-07 MED ORDER — HUMULIN 70/30 KWIKPEN (70-30) 100 UNIT/ML ~~LOC~~ SUPN: 20.0000 [IU] | PEN_INJECTOR | Freq: Two times a day (BID) | SUBCUTANEOUS | 1 refills | Status: DC

## 2024-03-07 NOTE — Progress Notes (Signed)
 Endocrinology Follow Up Note       03/07/2024, 4:54 PM   Subjective:    Patient ID: Kelly [name], female    DOB: 01-Sep-1940.  Kelly [name] is being seen in follow up after being seen in consultation for management of currently uncontrolled symptomatic diabetes requested by  Bair, Kalpana, MD.   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     Past Surgical History:  Procedure Laterality Date   APPENDECTOMY     BREAST BIOPSY Right 06/19/2018   affirm stereo/x clip/COLUMNAR CELL CHANGE WITH MICROCALCIFICATIONS. FIBROCYSTIC CHANGES WITH MICROCALCIFICATIONS   cataract  03/2017   CHOLECYSTECTOMY     COLONOSCOPY     COLONOSCOPY WITH PROPOFOL  N/A 08/22/2017   Procedure: COLONOSCOPY WITH PROPOFOL ;  Surgeon: Therisa Bi, MD;  Location: [facility] ENDOSCOPY;  Service: Gastroenterology;  Laterality: N/A;   ESOPHAGOGASTRODUODENOSCOPY     ESOPHAGOGASTRODUODENOSCOPY (EGD) WITH PROPOFOL  N/A 03/23/2016   Procedure: ESOPHAGOGASTRODUODENOSCOPY (EGD) WITH PROPOFOL ;  Surgeon: Lamar ONEIDA Holmes, MD;  Location: [facility] ENDOSCOPY;  Service: Endoscopy;  Laterality: N/A;   FLEXIBLE SIGMOIDOSCOPY N/A 02/06/2018   Procedure: FLEXIBLE SIGMOIDOSCOPY;  Surgeon: Therisa Bi, MD;  Location: [facility] ENDOSCOPY;  Service: Gastroenterology;  Laterality: N/A;   FLEXIBLE SIGMOIDOSCOPY N/A 12/03/2018   Procedure: FLEXIBLE SIGMOIDOSCOPY;  Surgeon: Therisa Bi, MD;  Location: [facility] ENDOSCOPY;  Service: Gastroenterology;  Laterality: N/A;   RECTAL PROLAPSE REPAIR  2012   x2   skin cancer removed from hand  2024    Social History   Socioeconomic History   Marital status: Widowed     Spouse name: Not on file   Number of children: Not on file   Years of education: Not on file   Highest education level: High school graduate  Occupational History   Occupation: retired   Tobacco Use   Smoking status: Never   Smokeless tobacco: Never  Vaping Use   Vaping status: Never Used  Substance and Sexual Activity   Alcohol use: No   Drug use: No   Sexual activity: Not Currently    Partners: Male  Other Topics Concern   Not on file  Social History Narrative   widow   Social Drivers of Health   Financial Resource Strain: Low Risk  (02/13/2023)   Overall Financial Resource Strain (CARDIA)    Difficulty of Paying Living Expenses: Not hard at all  Food Insecurity: No Food Insecurity (02/13/2023)   Hunger Vital Sign    Worried About Running Out of Food in the Last Year: Never true    Ran Out of Food in the Last Year: Never true  Transportation Needs: No Transportation Needs (02/13/2023)   PRAPARE - Administrator, Civil Service (Medical): No    Lack of Transportation (Non-Medical): No  Physical Activity: Sufficiently Active (02/13/2023)   Exercise Vital Sign  Days of Exercise per Week: 2 days    Minutes of Exercise per Session: 120 min  Stress: No Stress Concern Present (02/13/2023)   [employer] of Occupational Health - Occupational Stress Questionnaire    Feeling of Stress : Not at all  Social Connections: Moderately Integrated (02/13/2023)   Social Connection and Isolation Panel    Frequency of Communication with Friends and Family: More than three times a week    Frequency of Social Gatherings with Friends and Family: Three times a week    Attends Religious Services: More than 4 times per year    Active Member of Clubs or Organizations: Yes    Attends Banker Meetings: More than 4 times per year    Marital Status: Widowed    Family History  Problem Relation Age of Onset   Stroke Mother    Arthritis Mother    Aortic aneurysm  Sister    Lung cancer Sister    Heart attack Father 94   Breast cancer Sister 77    Outpatient Encounter Medications as of 03/07/2024  Medication Sig   amLODipine  (NORVASC ) 10 MG tablet Take 1 tablet (10 mg total) by mouth daily.   aspirin  EC 81 MG tablet Take 81 mg by mouth daily.   calcitRIOL (ROCALTROL) 0.25 MCG capsule Take 0.25 mcg by mouth daily.   Calcium  Citrate-Vitamin D  315-5 MG-MCG TABS Take 1 tablet by mouth daily.   Continuous Glucose Receiver (FREESTYLE LIBRE 3 READER) DEVI Use the Reader to monitor blood sugars as directed by provider.   Continuous Glucose Sensor (FREESTYLE LIBRE 3 PLUS SENSOR) MISC Use the Freestyle Libre Sensor to monitor bloodsugar readings as directed by provider.Change sensor every 15 days.   cyanocobalamin  (VITAMIN B12) 1000 MCG tablet Take 1 tablet (1,000 mcg total) by mouth daily.   diclofenac (FLECTOR) 1.3 % PTCH 1 patch 2 (two) times daily. (Patient taking differently: 1 patch 2 (two) times daily. As needed)   furosemide  (LASIX ) 40 MG tablet Take 40 mg by mouth daily as needed.   glucosamine-chondroitin 500-400 MG tablet Take 1 tablet by mouth 3 (three) times daily.   insulin  isophane & regular human KwikPen (HUMULIN 70/30 KWIKPEN) (70-30) 100 UNIT/ML KwikPen Inject 20 Units into the skin in the morning and at bedtime.   Lancets MISC Use up to 4 times daily to check blood sugars.   losartan  (COZAAR ) 100 MG tablet Take 100 mg by mouth daily.   Melatonin 5 MG CHEW Chew 5 mg by mouth at bedtime as needed.   NARCAN 4 MG/0.1ML LIQD nasal spray kit    ondansetron  (ZOFRAN -ODT) 4 MG disintegrating tablet Take by mouth.   potassium chloride (K-DUR) 10 MEQ tablet Take 10 mEq by mouth daily as needed.   rosuvastatin  (CRESTOR ) 10 MG tablet Take 1 tablet (10 mg total) by mouth daily.   [DISCONTINUED] insulin  isophane & regular human KwikPen (NOVOLIN  70/30 KWIKPEN) (70-30) 100 UNIT/ML KwikPen Patient is to inject 15 units sq each morning at breakfast and inject  10 units each evening at supper as directed by the provider.   [DISCONTINUED] Insulin  Pen Needle (BD PEN NEEDLE NANO 2ND GEN) 32G X 4 MM MISC Use to inject insulin  twice daily   HYDROcodone -acetaminophen  (NORCO/VICODIN) 5-325 MG tablet Take 1 tablet by mouth every 4 (four) hours as needed.   Insulin  Pen Needle (BD PEN NEEDLE NANO 2ND GEN) 32G X 4 MM MISC Use to inject insulin  twice daily   [DISCONTINUED] sertraline  (ZOLOFT ) 50 MG tablet Take  1 tablet (50 mg total) by mouth daily. (Patient not taking: Reported on 03/07/2024)   Facility-Administered Encounter Medications as of 03/07/2024  Medication   denosumab  (PROLIA ) injection 60 mg   denosumab  (PROLIA ) injection 60 mg   [START ON 04/16/2024] denosumab  (PROLIA ) injection 60 mg    ALLERGIES: Allergies  Allergen Reactions   Prilosec Otc [Omeprazole Magnesium] Other (See Comments)    Maybe cause of acute interstitial nephritis   Sucralfate  Other (See Comments)    Maybe cause of acute interstitial nephritis   Amoxicillin  Diarrhea   Morphine And Codeine Other (See Comments)    Chest pains Chest pains    VACCINATION STATUS: Immunization History  Administered Date(s) Administered    sv, Bivalent, Protein Subunit Rsvpref,pf (Abrysvo) 05/08/2023   Fluad Quad(high Dose 65+) 02/25/2019, 03/02/2020, 05/17/2021, 04/08/2022   Hep B, Unspecified 06/08/2000, 07/11/2000   INFLUENZA, HIGH DOSE SEASONAL PF 02/24/2017, 02/28/2018   Influenza,inj,quad, With Preservative 02/19/2016   Moderna Sars-Covid-2 Vaccination 07/05/2019, 08/12/2019, 03/02/2020, 08/10/2020   PNEUMOCOCCAL CONJUGATE-20 07/02/2021   Td 02/26/2019   Zoster Recombinant(Shingrix) 01/02/2020, 03/10/2020    Diabetes She presents for her follow-up diabetic visit. She has type 2 diabetes mellitus. Onset time: diagnosed at approx age of 70. Her disease course has been fluctuating. There are no hypoglycemic associated symptoms. There are no hypoglycemic complications. Diabetic  complications include nephropathy. (gastroparesis) Risk factors for coronary artery disease include diabetes mellitus and post-menopausal. Current diabetic treatment includes insulin  injections and diet. She is compliant with treatment most of the time. Her weight is fluctuating minimally. She is following a generally unhealthy diet. When asked about meal planning, she reported none. She has not had a previous visit with a dietitian. She rarely participates in exercise. Her home blood glucose trend is fluctuating dramatically. Her overall blood glucose range is >200 mg/dl. (She presents today with her CGM showing fluctuating glycemic profile slowly improving.  Her POCT A1c today is  9.8%, improving from last visit of 10%.  Analysis of her CGM shows TIR 38%, TAR 61%, TBR 1% with a GMI of 8.4%.  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  She has connection issues with her CGM right now, is considering trying to use it on her phone.) An ACE inhibitor/angiotensin II receptor blocker is being taken. She does not see a podiatrist.Eye exam is current.    Review of systems  Constitutional: + Minimally fluctuating body weight,  current Body mass index is 25.33 kg/m. , no fatigue, no subjective hyperthermia, no subjective hypothermia Eyes: no blurry vision, no xerophthalmia ENT: no sore throat, no nodules palpated in throat, no dysphagia/odynophagia, no hoarseness Cardiovascular: no chest pain, no shortness of breath, no palpitations, no leg swelling Respiratory: no cough, no shortness of breath Gastrointestinal: no nausea/vomiting/diarrhea Musculoskeletal: no muscle/joint aches Skin: no rashes, no hyperemia Neurological: no tremors, no numbness, no tingling, no dizziness Psychiatric: no depression, no anxiety  Objective:     BP 126/74 (BP Location: Right Arm, Patient Position: Sitting, Cuff Size: Large)   Pulse 72   Ht 5' 3 (1.6 m)   Wt 143 lb (64.9 kg)   BMI 25.33 kg/m   Wt  Readings from Last 3 Encounters:  03/07/24 143 lb (64.9 kg)  02/05/24 143 lb 12.8 oz (65.2 kg)  01/23/24 141 lb 9.6 oz (64.2 kg)     BP Readings from Last 3 Encounters:  03/07/24 126/74  02/05/24 110/74  01/23/24 (!) 118/58      Physical Exam- Limited  Constitutional:  Body mass index is 25.33 kg/m. , not in acute distress, normal state of mind Eyes:  EOMI, no exophthalmos Musculoskeletal: no gross deformities, strength intact in all four extremities, no gross restriction of joint movements Skin:  no rashes, no hyperemia Neurological: no tremor with outstretched hands   Diabetic Foot Exam - Simple   No data filed     CMP ( most recent) CMP     Component Value Date/Time   NA 137 12/06/2023 1338   NA 142 10/20/2023 1629   NA 137 01/18/2013 1036   K 4.8 12/06/2023 1338   K 3.9 01/18/2013 1036   CL 106 12/06/2023 1338   CL 103 01/18/2013 1036   CO2 27 12/06/2023 1338   CO2 26 01/18/2013 1036   GLUCOSE 286 (H) 12/06/2023 1338   GLUCOSE 342 (H) 01/18/2013 1036   BUN 27 (H) 12/06/2023 1338   BUN 28 (H) 10/20/2023 1629   BUN 14 01/18/2013 1036   CREATININE 2.07 (H) 12/06/2023 1338   CREATININE 1.48 (H) 01/18/2013 1036   CALCIUM  8.7 12/06/2023 1338   CALCIUM  9.3 01/18/2013 1036   PROT 6.5 09/13/2023 0935   PROT 6.2 (L) 01/18/2013 1036   ALBUMIN 3.8 09/13/2023 0935   ALBUMIN 3.3 (L) 01/18/2013 1036   AST 14 09/13/2023 0935   AST 22 01/18/2013 1036   ALT 10 09/13/2023 0935   ALT 46 01/18/2013 1036   ALKPHOS 78 09/13/2023 0935   ALKPHOS 120 01/18/2013 1036   BILITOT 0.5 09/13/2023 0935   BILITOT 0.6 01/18/2013 1036   GFRNONAA 29 (L) 04/26/2022 1823   GFRNONAA 35 (L) 01/18/2013 1036   GFRAA 19 (L) 10/20/2019 1735   GFRAA 41 (L) 01/18/2013 1036     Diabetic Labs (most recent): Lab Results  Component Value Date   HGBA1C 9.8 (A) 03/07/2024   HGBA1C 10.0 (A) 11/07/2023   HGBA1C 9.8 (A) 08/02/2023   MICROALBUR 27.2 (H) 09/13/2023     Lipid Panel ( most  recent) Lipid Panel     Component Value Date/Time   CHOL 163 09/13/2023 0935   TRIG 183.0 (H) 09/13/2023 0935   HDL 28.40 (L) 09/13/2023 0935   CHOLHDL 6 09/13/2023 0935   VLDL 36.6 09/13/2023 0935   LDLCALC 98 09/13/2023 0935   LDLDIRECT 93.0 04/21/2023 1324      Lab Results  Component Value Date   TSH 4.41 11/08/2021   TSH 3.93 03/22/2017   TSH 0.719 07/09/2016           Assessment & Plan:   1) Type 2 diabetes mellitus with stage 3b chronic kidney disease, with long-term current use of insulin  (HCC)  She presents today, accompanied by her daughter, with her CGM showing fluctuating glycemic profile slowly improving.  Her POCT A1c today is  9.8%, improving from last visit of 10%.  Analysis of her CGM shows TIR 38%, TAR 61%, TBR 1% with a GMI of 8.4%.  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  She has connection issues with her CGM right now, is considering trying to use it on her phone.  - Kelly [name] has currently uncontrolled symptomatic type 2 DM since 83 years of age.   -Recent labs reviewed.  - I had a long discussion with her about the progressive nature of diabetes and the pathology behind its complications. 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 These are all discussed in detail with her.  The following Lifestyle Medicine recommendations according to American College of Lifestyle Medicine [facility]) were discussed and offered to patient and she agrees to start the journey:  A. Whole Foods, Plant-based plate comprising of fruits and vegetables, plant-based proteins, whole-grain carbohydrates was discussed in detail with the patient.   A list for source of those nutrients were also provided to the patient.  Patient will use only water or unsweetened tea for hydration. B.  The need to stay away  from risky substances including alcohol, smoking; obtaining 7 to 9 hours of restorative sleep, at least 150 minutes of moderate intensity exercise weekly, the importance of healthy social connections,  and stress reduction techniques were discussed. C.  A full color page of  Calorie density of various food groups per pound showing examples of each food groups was provided to the patient.  - Nutritional counseling repeated/built upon at each appointment.  - The patient admits there is a room for improvement in their diet and drink choices. -  Suggestion is made for the patient to avoid simple carbohydrates from their diet including Cakes, Sweet Desserts / Pastries, Ice Cream, Soda (diet and regular), Sweet Tea, Candies, Chips, Cookies, Sweet Pastries, Store Bought Juices, Alcohol in Excess of 1-2 drinks a day, Artificial Sweeteners, Coffee Creamer, and Sugar-free Products. This will help patient to have stable blood glucose profile and potentially avoid unintended weight gain.   - I encouraged the patient to switch to unprocessed or minimally processed complex starch and increased protein intake (animal or plant source), fruits, and vegetables.   - Patient is advised to stick to a routine mealtimes to eat 3 meals a day and avoid unnecessary snacks (to snack only to correct hypoglycemia).  - I have approached her with the following individualized plan to manage her diabetes and patient agrees:   -Avoiding hypoglycemia is a top priority in her care.  A good goal A1c for her would be 7%.  -She is advised to adjust her premixed insulin  70/30 20 units with breakfast and 10 units with supper if glucose is above 90 and she is eating.   If she misses her supper shot, it is ok for her to skip it until the next day rather than take it without food.  -she is encouraged to continue monitoring glucose at least 4 times daily (using her CGM), before meals and before bed, and to call the clinic if she has  readings less than 70 or above 300 for 3 tests in a row.  - she is warned not to take insulin  without proper monitoring per orders. - Adjustment parameters are given to her for hypo and hyperglycemia in writing.  -She is not an ideal candidate for incretin therapy due to body habitus with BMI of 22.  She is also not an ideal candidate for SGLT2i due to CKD stage 4.  - Specific targets for  A1c; LDL, HDL, and Triglycerides were discussed with the patient.  2) Blood Pressure /Hypertension:  her blood pressure is controlled to target for her age.   she is advised to continue her current medications as prescribed by PCP/nephrology.  3) Lipids/Hyperlipidemia:    Review of her recent lipid panel from 07/07/22 showed controlled LDL at 74 .  she is advised to continue Crestor  10 mg daily at bedtime.  Side effects and precautions discussed with her.  4)  Weight/Diet:  her Body mass index is 25.33  kg/m.  -    she is NOT a candidate for weight loss.   Exercise, and detailed carbohydrates information provided  -  detailed on discharge instructions.  5) Chronic Care/Health Maintenance: -she is on ACEI/ARB and Statin medications and is encouraged to initiate and continue to follow up with Ophthalmology, Dentist, Podiatrist at least yearly or according to recommendations, and advised to stay away from smoking. I have recommended yearly flu vaccine and pneumonia vaccine at least every 5 years; moderate intensity exercise for up to 150 minutes weekly; and sleep for at least 7 hours a day.  - she is advised to maintain close follow up with Bair, Kalpana, MD for primary care needs, as well as her other providers for optimal and coordinated care.     I spent  51  minutes in the care of the patient today including review of labs from CMP, Lipids, Thyroid  Function, Hematology (current and previous including abstractions from other facilities); face-to-face time discussing  her blood glucose readings/logs,  discussing hypoglycemia and hyperglycemia episodes and symptoms, medications doses, her options of short and long term treatment based on the latest standards of care / guidelines;  discussion about incorporating lifestyle medicine;  and documenting the encounter. Risk reduction counseling performed per USPSTF guidelines to reduce obesity and cardiovascular risk factors.     Please refer to Patient Instructions for Blood Glucose Monitoring and Insulin /Medications Dosing Guide  in media tab for additional information. Please  also refer to  Patient Self Inventory in the Media  tab for reviewed elements of pertinent patient history.  Kelly [name] participated in the discussions, expressed understanding, and voiced agreement with the above plans.  All questions were answered to her satisfaction. she is encouraged to contact clinic should she have any questions or concerns prior to her return visit.     Follow up plan: - Return in about 3 months (around 06/07/2024) for Diabetes F/U with A1c in office, No previsit labs, Bring meter and logs.  Benton Rio, [facility], [facility] [address] [place], [place] [address] Phone: [phone] Fax: [phone]  03/07/2024, 4:54 PM

## 2024-03-07 NOTE — Telephone Encounter (Signed)
 Pharmacy Patient Advocate Encounter   Received notification from Pt Calls Messages that prior authorization for Humulin 70/30 is required/requested.   Insurance verification completed.   The patient is insured through [employer].   Per test claim: Refill too soon. PA is not needed at this time. Medication was filled 01/13/2024. Next eligible fill date is 03/09/2024.  It looks like she previously had Novolin  filled on 9/6, but insurance is counting them as the same. I can run another test claim when it is eligible for refill to see which is the preferred.

## 2024-03-08 NOTE — Telephone Encounter (Signed)
 Addressed in separate encounter.

## 2024-03-08 NOTE — Telephone Encounter (Signed)
Thanks so much. I appreciate it. 

## 2024-03-18 ENCOUNTER — Telehealth: Payer: Self-pay

## 2024-03-18 ENCOUNTER — Other Ambulatory Visit (HOSPITAL_COMMUNITY): Payer: Self-pay

## 2024-03-18 NOTE — Telephone Encounter (Signed)
 SABRA

## 2024-03-18 NOTE — Telephone Encounter (Signed)
 Pt ready for scheduling for PROLIA  on or after : 04/16/24  Option# 1: Buy/Bill (Office supplied medication)  Out-of-pocket cost due at time of clinic visit: $0  Number of injection/visits approved: ---  Primary: MEDICARE Prolia  co-insurance: 0% Admin fee co-insurance: 0%  Secondary: [employer]FE[place] Prolia  co-insurance:  Admin fee co-insurance:   Medical Benefit Details: Date Benefits were checked: 03/18/24 Deductible: $257 Met of $257 Required/ Coinsurance: 0%/ Admin Fee: 0%  Prior Auth: N/A PA# Expiration Date:   # of doses approved: ----------------------------------------------------------------------- Option# 2- Med Obtained from pharmacy:  Pharmacy benefit: Copay $195 (Paid to pharmacy) Admin Fee: 0% (Pay at clinic)  Prior Auth: N/A PA# Expiration Date:   # of doses approved:   If patient wants fill through the pharmacy benefit please send prescription to: [facility], and include estimated need by date in rx notes. Pharmacy will ship medication directly to the office.  Patient NOT eligible for Prolia  Copay Card. Copay Card can make patient's cost as little as $25. Link to apply: [email or web address]://www.amgensupportplus.com/copay  ** This summary of benefits is an estimation of the patient's out-of-pocket cost. Exact cost may very based on individual plan coverage.

## 2024-03-18 NOTE — Telephone Encounter (Signed)
 Prolia VOB initiated via [email or web address]  Next Prolia inj DUE: 06/07/23

## 2024-04-01 ENCOUNTER — Ambulatory Visit (INDEPENDENT_AMBULATORY_CARE_PROVIDER_SITE_OTHER): Admitting: *Deleted

## 2024-04-01 VITALS — Ht 63.0 in | Wt 140.0 lb

## 2024-04-01 DIAGNOSIS — Z0000 Encounter for general adult medical examination without abnormal findings: Secondary | ICD-10-CM

## 2024-04-01 NOTE — Progress Notes (Signed)
 Chief Complaint  Patient presents with   Medicare Wellness     Subjective:   Kelly [name] is a 83 y.o. female who presents for a Medicare Annual Wellness Visit.  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    Past Surgical History:  Procedure Laterality Date   APPENDECTOMY     BREAST BIOPSY Right 06/19/2018   affirm stereo/x clip/COLUMNAR CELL CHANGE WITH MICROCALCIFICATIONS. FIBROCYSTIC CHANGES WITH MICROCALCIFICATIONS   cataract  03/2017   CHOLECYSTECTOMY     COLONOSCOPY     COLONOSCOPY WITH PROPOFOL  N/A 08/22/2017   Procedure: COLONOSCOPY WITH PROPOFOL ;  Surgeon: Therisa Bi, MD;  Location: [facility] ENDOSCOPY;  Service: Gastroenterology;  Laterality: N/A;   ESOPHAGOGASTRODUODENOSCOPY     ESOPHAGOGASTRODUODENOSCOPY (EGD) WITH PROPOFOL  N/A 03/23/2016   Procedure: ESOPHAGOGASTRODUODENOSCOPY (EGD) WITH PROPOFOL ;  Surgeon: Lamar ONEIDA Holmes, MD;  Location: [facility] ENDOSCOPY;  Service: Endoscopy;  Laterality: N/A;   FLEXIBLE SIGMOIDOSCOPY N/A 02/06/2018   Procedure: FLEXIBLE SIGMOIDOSCOPY;  Surgeon: Therisa Bi, MD;  Location: [facility] ENDOSCOPY;  Service: Gastroenterology;  Laterality: N/A;   FLEXIBLE SIGMOIDOSCOPY N/A 12/03/2018   Procedure: FLEXIBLE SIGMOIDOSCOPY;  Surgeon: Therisa Bi, MD;  Location: [facility] ENDOSCOPY;  Service: Gastroenterology;  Laterality: N/A;   RECTAL PROLAPSE REPAIR  2012   x2   skin cancer removed from hand  2024   Family History  Problem Relation Age of Onset   Stroke Mother    Arthritis Mother    Aortic aneurysm Sister    Lung cancer Sister    Heart attack Father 51    Breast cancer Sister 72   Social History   Occupational History   Occupation: retired   Tobacco Use   Smoking status: Never   Smokeless tobacco: Never  Vaping Use   Vaping status: Never Used  Substance and Sexual Activity   Alcohol use: No   Drug use: No   Sexual activity: Not Currently    Partners: Male   Tobacco Counseling Counseling given: Not Answered  SDOH Screenings   Food Insecurity: No Food Insecurity (04/01/2024)  Housing: Low Risk  (04/01/2024)  Transportation Needs: No Transportation Needs (04/01/2024)  Utilities: Not At Risk (04/01/2024)  Alcohol Screen: Low Risk  (04/01/2024)  Depression (PHQ2-9): Medium Risk (04/01/2024)  Financial Resource Strain: Low Risk  (04/01/2024)  Physical Activity: Inactive (04/01/2024)  Social Connections: Moderately Integrated (04/01/2024)  Stress: No Stress Concern Present (04/01/2024)  Tobacco Use: Low Risk  (04/01/2024)  Health Literacy: Adequate Health Literacy (04/01/2024)   See flowsheets for full screening details  Depression Screen PHQ 2 & 9 Depression Scale- Over the past 2 weeks, how often have you been bothered by any of the following problems? Little interest or pleasure in doing things: 0 Feeling down, depressed, or hopeless (PHQ Adolescent also includes...irritable): 0 PHQ-2 Total Score: 0 Trouble falling or staying asleep, or sleeping too much: 3 Feeling tired or having little energy: 2 Poor appetite or overeating (PHQ Adolescent also includes...weight loss): 0 Feeling bad about yourself - or that you are a failure or have let yourself or your family down:  0 Trouble concentrating on things, such as reading the newspaper or watching television [facility] Adolescent also includes...like school work): 0 Moving or speaking so slowly that other people could have noticed. Or the opposite - being so fidgety or restless that you have been moving around a lot more than usual: 0 Thoughts that you would be better off dead, or  of hurting yourself in some way: 0 PHQ-9 Total Score: 5 If you checked off any problems, how difficult have these problems made it for you to do your work, take care of things at home, or get along with other people?: Somewhat difficult     Goals Addressed             This Visit's Progress    Patient Stated       Wants to lose 20 pounds       Visit info / Clinical Intake: Medicare Wellness Visit Type:: Subsequent Annual Wellness Visit Persons participating in visit:: patient Medicare Wellness Visit Mode:: Telephone If telephone:: video declined Because this visit was a virtual/telehealth visit:: pt reported vitals If Telephone or Video please confirm:: I connected with the patient using audio enabled telemedicine application and verified that I am speaking with the correct person using two identifiers; I discussed the limitations of evaluation and management by telemedicine; The patient expressed understanding and agreed to proceed Patient Location:: Home Provider Location:: Office/Home Information given by:: patient Interpreter Needed?: No Pre-visit prep was completed: yes AWV questionnaire completed by patient prior to visit?: no Living arrangements:: (!) lives alone Patient's Overall Health Status Rating: good Typical amount of pain: (!) a lot Does pain affect daily life?: (!) yes Are you currently prescribed opioids?: (!) yes  Dietary Habits and Nutritional Risks How many meals a day?: 2 Eats fruit and vegetables daily?: (!) no Most meals are obtained by: eating out In the last 2 weeks, have you had any of the following?: none Diabetic:: (!) yes Any non-healing wounds?: no How often do you check your BS?: continuous glucose monitor Would you like to be referred to a Nutritionist or for Diabetic Management? : no  Functional Status Activities of Daily Living (to include ambulation/medication): Independent Ambulation: Independent Medication Administration:  Independent Home Management: Independent Manage your own finances?: yes Primary transportation is: driving Concerns about vision?: (!) yes (vision seens to be getting worse) Concerns about hearing?: (!) yes Uses hearing aids?: (!) yes Hear whispered voice?: -- (televisit)  Fall Screening Falls in the past year?: 0 Number of falls in past year: 0 Was there an injury with Fall?: 0 Fall Risk Category Calculator: 0 Patient Fall Risk Level: Low Fall Risk  Fall Risk Patient at Risk for Falls Due to: No Fall Risks Fall risk Follow up: Falls evaluation completed; Falls prevention discussed  Home and Transportation Safety: All rugs have non-skid backing?: yes All stairs or steps have railings?: yes Grab bars in the bathtub or shower?: yes Have non-skid surface in bathtub or shower?: yes Good home lighting?: yes Regular seat belt use?: yes Hospital stays in the last year:: no  Cognitive Assessment Difficulty concentrating, remembering, or making decisions? : no Will 6CIT or Mini Cog be Completed: yes What year is it?: 0 points What month is it?: 0 points Give patient an address phrase to remember (5 components): [address] [place], [place] About what time is it?: 0 points Count backwards from 20 to 1: 0 points Say the months of the year in reverse: 0 points Repeat the address phrase  from earlier: 0 points 6 CIT Score: 0 points  Advance Directives (For Healthcare) Does Patient Have a Medical Advance Directive?: Yes Does patient want to make changes to medical advance directive?: No - Patient declined Type of Advance Directive: Healthcare Power of [place]; Living will Copy of Healthcare Power of Attorney in Chart?: No - copy requested Copy of Living Will in Chart?: No - copy requested  Reviewed/Updated  Reviewed/Updated: Reviewed All (Medical, Surgical, Family, Medications, Allergies, Care Teams, Patient Goals)        Objective:    Today's Vitals   04/01/24 0849   Weight: 140 lb (63.5 kg)  Height: 5' 3 (1.6 m)   Body mass index is 24.8 kg/m.  Current Medications (verified) Outpatient Encounter Medications as of 04/01/2024  Medication Sig   amLODipine  (NORVASC ) 10 MG tablet Take 1 tablet (10 mg total) by mouth daily.   aspirin  EC 81 MG tablet Take 81 mg by mouth daily.   calcitRIOL (ROCALTROL) 0.25 MCG capsule Take 0.25 mcg by mouth daily.   Calcium  Citrate-Vitamin D  315-5 MG-MCG TABS Take 1 tablet by mouth daily.   Continuous Glucose Receiver (FREESTYLE LIBRE 3 READER) DEVI Use the Reader to monitor blood sugars as directed by provider.   Continuous Glucose Sensor (FREESTYLE LIBRE 3 PLUS SENSOR) MISC Use the Freestyle Libre Sensor to monitor bloodsugar readings as directed by provider.Change sensor every 15 days.   cyanocobalamin  (VITAMIN B12) 1000 MCG tablet Take 1 tablet (1,000 mcg total) by mouth daily.   diclofenac (FLECTOR) 1.3 % PTCH 1 patch 2 (two) times daily.   furosemide  (LASIX ) 40 MG tablet Take 40 mg by mouth daily as needed.   glucosamine-chondroitin 500-400 MG tablet Take 1 tablet by mouth 3 (three) times daily.   HYDROcodone -acetaminophen  (NORCO/VICODIN) 5-325 MG tablet Take 1 tablet by mouth every 4 (four) hours as needed.   insulin  isophane & regular human KwikPen (HUMULIN  70/30 KWIKPEN) (70-30) 100 UNIT/ML KwikPen Inject 20 Units into the skin in the morning and at bedtime.   Insulin  Pen Needle (BD PEN NEEDLE NANO 2ND GEN) 32G X 4 MM MISC Use to inject insulin  twice daily   Lancets MISC Use up to 4 times daily to check blood sugars.   losartan  (COZAAR ) 100 MG tablet Take 100 mg by mouth daily.   NARCAN 4 MG/0.1ML LIQD nasal spray kit    potassium chloride (K-DUR) 10 MEQ tablet Take 10 mEq by mouth daily as needed.   rosuvastatin  (CRESTOR ) 10 MG tablet Take 1 tablet (10 mg total) by mouth daily.   Melatonin 5 MG CHEW Chew 5 mg by mouth at bedtime as needed. (Patient not taking: Reported on 04/01/2024)   ondansetron  (ZOFRAN -ODT)  4 MG disintegrating tablet Take by mouth. (Patient not taking: Reported on 04/01/2024)   Facility-Administered Encounter Medications as of 04/01/2024  Medication   denosumab  (PROLIA ) injection 60 mg   denosumab  (PROLIA ) injection 60 mg   [START ON 04/16/2024] denosumab  (PROLIA ) injection 60 mg   Hearing/Vision screen Hearing Screening - Comments:: Wears aids Vision Screening - Comments:: Readers, [facility], overdue, Patient stated that she will call and schedule an appointment Immunizations and Health Maintenance Health Maintenance  Topic Date Due   Influenza Vaccine  12/08/2023   OPHTHALMOLOGY EXAM  12/21/2023   COVID-19 Vaccine (5 - 2025-26 season) 01/08/2024   HEMOGLOBIN A1C  09/05/2024   Diabetic kidney evaluation - Urine ACR  09/12/2024   Diabetic kidney evaluation - eGFR measurement  12/05/2024   FOOT EXAM  12/05/2024  Medicare Annual Wellness (AWV)  04/01/2025   DTaP/Tdap/Td (2 - Tdap) 02/25/2029   Pneumococcal Vaccine: 50+ Years  Completed   Bone Density Scan  Completed   Zoster Vaccines- Shingrix  Completed   Meningococcal B Vaccine  Aged Out   Hepatitis C Screening  Discontinued        Assessment/Plan:  This is a routine wellness examination for Chabeli.  Patient Care Team: Bair, Kalpana, MD as PCP - General (Family Medicine) Dennise Capri, MD (Nephrology) Tobie Franky SQUIBB, DPM as Consulting Physician (Podiatry) Therisa Benton PARAS, NP as Nurse Practitioner (Nurse Practitioner)  I have personally reviewed and noted the following in the patient's chart:   Medical and social history Use of alcohol, tobacco or illicit drugs  Current medications and supplements including opioid prescriptions. Functional ability and status Nutritional status Physical activity Advanced directives List of other physicians Hospitalizations, surgeries, and ER visits in previous 12 months Vitals Screenings to include cognitive, depression, and falls Referrals and  appointments  No orders of the defined types were placed in this encounter.  In addition, I have reviewed and discussed with patient certain preventive protocols, quality metrics, and best practice recommendations. A written personalized care plan for preventive services as well as general preventive health recommendations were provided to patient.   Angeline Fredericks, LPN   [id number]   Return in 1 year (on 04/01/2025).  After Visit Summary: (MyChart) Due to this being a telephonic visit, the after visit summary with patients personalized plan was offered to patient via MyChart   Nurse Notes: Discussed the need to update flu vaccine. Patient declines covid vaccine.

## 2024-04-01 NOTE — Patient Instructions (Addendum)
 Kelly [name],  Thank you for taking the time for your Medicare Wellness Visit. I appreciate your continued commitment to your health goals. Please review the care plan we discussed, and feel free to reach out if I can assist you further.  Please note that Annual Wellness Visits do not include a physical exam. Some assessments may be limited, especially if the visit was conducted virtually. If needed, we may recommend an in-person follow-up with your provider.  Ongoing Care Seeing your primary care provider every 3 to 6 months helps us  monitor your health and provide consistent, personalized care.  Remember to get your flu vaccine at your upcoming office visit. Make sure that you call and schedule a diabetic eye exam.  Referrals If a referral was made during today's visit and you haven't received any updates within two weeks, please contact the referred provider directly to check on the status.  Recommended Screenings:  Health Maintenance  Topic Date Due   Flu Shot  12/08/2023   Eye exam for diabetics  12/21/2023   COVID-19 Vaccine (5 - 2025-26 season) 01/08/2024   Hemoglobin A1C  09/05/2024   Yearly kidney health urinalysis for diabetes  09/12/2024   Yearly kidney function blood test for diabetes  12/05/2024   Complete foot exam   12/05/2024   Medicare Annual Wellness Visit  04/01/2025   DTaP/Tdap/Td vaccine (2 - Tdap) 02/25/2029   Pneumococcal Vaccine for age over 73  Completed   Osteoporosis screening with Bone Density Scan  Completed   Zoster (Shingles) Vaccine  Completed   Meningitis B Vaccine  Aged Out   Hepatitis C Screening  Discontinued       04/01/2024    8:52 AM  Advanced Directives  Does Patient Have a Medical Advance Directive? Yes  Type of Estate Agent of [place];Living will  Does patient want to make changes to medical advance directive? No - Patient declined  Copy of Healthcare Power of Attorney in Chart? No - copy requested    Vision:  Annual vision screenings are recommended for early detection of glaucoma, cataracts, and diabetic retinopathy. These exams can also reveal signs of chronic conditions such as diabetes and high blood pressure.  Dental: Annual dental screenings help detect early signs of oral cancer, gum disease, and other conditions linked to overall health, including heart disease and diabetes.  Please see the attached documents for additional preventive care recommendations.   Managing Pain Without Opioids Opioids are strong medicines used to treat moderate to severe pain. For some people, especially those who have long-term (chronic) pain, opioids may not be the best choice for pain management due to: Side effects like nausea, constipation, and sleepiness. The risk of addiction (opioid use disorder). The longer you take opioids, the greater your risk of addiction. Pain that lasts for more than 3 months is called chronic pain. Managing chronic pain usually requires more than one approach and is often provided by a team of health care providers working together (multidisciplinary approach). Pain management may be done at a pain management center or pain clinic. How to manage pain without the use of opioids Use non-opioid medicines Non-opioid medicines for pain may include: Over-the-counter or prescription non-steroidal anti-inflammatory drugs (NSAIDs). These may be the first medicines used for pain. They work well for muscle and bone pain, and they reduce swelling. Acetaminophen . This over-the-counter medicine may work well for milder pain but not swelling. Antidepressants. These may be used to treat chronic pain. A certain type of antidepressant (  tricyclics) is often used. These medicines are given in lower doses for pain than when used for depression. Anticonvulsants. These are usually used to treat seizures but may also reduce nerve (neuropathic) pain. Muscle relaxants. These relieve pain caused by sudden muscle  tightening (spasms). You may also use a pain medicine that is applied to the skin as a patch, cream, or gel (topical analgesic), such as a numbing medicine. These may cause fewer side effects than medicines taken by mouth. Do certain therapies as directed Some therapies can help with pain management. They include: Physical therapy. You will do exercises to gain strength and flexibility. A physical therapist may teach you exercises to move and stretch parts of your body that are weak, stiff, or painful. You can learn these exercises at physical therapy visits and practice them at home. Physical therapy may also involve: Massage. Heat wraps or applying heat or cold to affected areas. Electrical signals that interrupt pain signals (transcutaneous electrical nerve stimulation, TENS). Weak lasers that reduce pain and swelling (low-level laser therapy). Signals from your body that help you learn to regulate pain (biofeedback). Occupational therapy. This helps you to learn ways to function at home and work with less pain. Recreational therapy. This involves trying new activities or hobbies, such as a physical activity or drawing. Mental health therapy, including: Cognitive behavioral therapy (CBT). This helps you learn coping skills for dealing with pain. Acceptance and commitment therapy (ACT) to change the way you think and react to pain. Relaxation therapies, including muscle relaxation exercises and mindfulness-based stress reduction. Pain management counseling. This may be individual, family, or group counseling.  Receive medical treatments Medical treatments for pain management include: Nerve block injections. These may include a pain blocker and anti-inflammatory medicines. You may have injections: Near the spine to relieve chronic back or neck pain. Into joints to relieve back or joint pain. Into nerve areas that supply a painful area to relieve body pain. Into muscles (trigger point  injections) to relieve some painful muscle conditions. A medical device placed near your spine to help block pain signals and relieve nerve pain or chronic back pain (spinal cord stimulation device). Acupuncture. Follow these instructions at home Medicines Take over-the-counter and prescription medicines only as told by your health care provider. If you are taking pain medicine, ask your health care providers about possible side effects to watch out for. Do not drive or use heavy machinery while taking prescription opioid pain medicine. Lifestyle  Do not use drugs or alcohol to reduce pain. If you drink alcohol, limit how much you have to: 0-1 drink a day for women who are not pregnant. 0-2 drinks a day for men. Know how much alcohol is in a drink. In the [place] one drink equals one 12 oz bottle of beer (355 mL), one 5 oz glass of wine (148 mL), or one 1 oz glass of hard liquor (44 mL). Do not use any products that contain nicotine or tobacco. These products include cigarettes, chewing tobacco, and vaping devices, such as e-cigarettes. If you need help quitting, ask your health care provider. Eat a healthy diet and maintain a healthy weight. Poor diet and excess weight may make pain worse. Eat foods that are high in fiber. These include fresh fruits and vegetables, whole grains, and beans. Limit foods that are high in fat and processed sugars, such as fried and sweet foods. Exercise regularly. Exercise lowers stress and may help relieve pain. Ask your health care provider what activities and  exercises are safe for you. If your health care provider approves, join an exercise class that combines movement and stress reduction. Examples include yoga and tai chi. Get enough sleep. Lack of sleep may make pain worse. Lower stress as much as possible. Practice stress reduction techniques as told by your therapist. General instructions Work with all your pain management providers to find the  treatments that work best for you. You are an important member of your pain management team. There are many things you can do to reduce pain on your own. Consider joining an online or in-person support group for people who have chronic pain. Keep all follow-up visits. This is important. Where to find more information You can find more information about managing pain without opioids from: American Academy of Pain Medicine: painmed.org Institute for Chronic Pain: instituteforchronicpain.org American Chronic Pain Association: theacpa.org Contact a health care provider if: You have side effects from pain medicine. Your pain gets worse or does not get better with treatments or home therapy. You are struggling with anxiety or depression. Summary Many types of pain can be managed without opioids. Chronic pain may respond better to pain management without opioids. Pain is best managed when you and a team of health care providers work together. Pain management without opioids may include non-opioid medicines, medical treatments, physical therapy, mental health therapy, and lifestyle changes. Tell your health care providers if your pain gets worse or is not being managed well enough. This information is not intended to replace advice given to you by your health care provider. Make sure you discuss any questions you have with your health care provider. Document Revised: 08/05/2020 Document Reviewed: 08/05/2020 Elsevier Patient Education  2024 [employer].

## 2024-04-03 DIAGNOSIS — Z961 Presence of intraocular lens: Secondary | ICD-10-CM | POA: Diagnosis not present

## 2024-04-03 DIAGNOSIS — E119 Type 2 diabetes mellitus without complications: Secondary | ICD-10-CM | POA: Diagnosis not present

## 2024-04-03 LAB — OPHTHALMOLOGY REPORT-SCANNED

## 2024-04-06 ENCOUNTER — Emergency Department

## 2024-04-06 ENCOUNTER — Other Ambulatory Visit: Payer: Self-pay

## 2024-04-06 ENCOUNTER — Emergency Department
Admission: EM | Admit: 2024-04-06 | Discharge: 2024-04-06 | Disposition: A | Discharge status: Home / Self Care | Attending: Emergency Medicine | Admitting: Emergency Medicine

## 2024-04-06 DIAGNOSIS — E119 Type 2 diabetes mellitus without complications: Secondary | ICD-10-CM | POA: Insufficient documentation

## 2024-04-06 DIAGNOSIS — Y92410 Unspecified street and highway as the place of occurrence of the external cause: Secondary | ICD-10-CM | POA: Insufficient documentation

## 2024-04-06 DIAGNOSIS — M542 Cervicalgia: Secondary | ICD-10-CM

## 2024-04-06 DIAGNOSIS — M47812 Spondylosis without myelopathy or radiculopathy, cervical region: Secondary | ICD-10-CM | POA: Diagnosis not present

## 2024-04-06 DIAGNOSIS — M4802 Spinal stenosis, cervical region: Secondary | ICD-10-CM | POA: Diagnosis not present

## 2024-04-06 DIAGNOSIS — S8012XA Contusion of left lower leg, initial encounter: Secondary | ICD-10-CM | POA: Diagnosis not present

## 2024-04-06 DIAGNOSIS — R519 Headache, unspecified: Secondary | ICD-10-CM | POA: Diagnosis not present

## 2024-04-06 DIAGNOSIS — S299XXA Unspecified injury of thorax, initial encounter: Secondary | ICD-10-CM | POA: Diagnosis not present

## 2024-04-06 DIAGNOSIS — S1081XA Abrasion of other specified part of neck, initial encounter: Secondary | ICD-10-CM | POA: Insufficient documentation

## 2024-04-06 DIAGNOSIS — R739 Hyperglycemia, unspecified: Secondary | ICD-10-CM | POA: Diagnosis not present

## 2024-04-06 DIAGNOSIS — I10 Essential (primary) hypertension: Secondary | ICD-10-CM | POA: Insufficient documentation

## 2024-04-06 DIAGNOSIS — R0781 Pleurodynia: Secondary | ICD-10-CM | POA: Diagnosis not present

## 2024-04-06 DIAGNOSIS — M4312 Spondylolisthesis, cervical region: Secondary | ICD-10-CM | POA: Diagnosis not present

## 2024-04-06 DIAGNOSIS — M50322 Other cervical disc degeneration at C5-C6 level: Secondary | ICD-10-CM | POA: Diagnosis not present

## 2024-04-06 LAB — CBG MONITORING, ED: Glucose-Capillary: 254 mg/dL — ABNORMAL HIGH (ref 70–99)

## 2024-04-06 MED ORDER — HYDROCODONE-ACETAMINOPHEN 5-325 MG PO TABS
1.0000 | ORAL_TABLET | Freq: Once | ORAL | Status: AC
  Administered 2024-04-06: 1 via ORAL
  Filled 2024-04-06: qty 1

## 2024-04-06 MED ORDER — ONDANSETRON 4 MG PO TBDP
4.0000 mg | ORAL_TABLET | Freq: Once | ORAL | Status: AC
  Administered 2024-04-06: 4 mg via ORAL
  Filled 2024-04-06: qty 1

## 2024-04-06 MED ORDER — BACITRACIN ZINC 500 UNIT/GM EX OINT
TOPICAL_OINTMENT | Freq: Once | CUTANEOUS | Status: AC
  Administered 2024-04-06: 1 via TOPICAL
  Filled 2024-04-06: qty 0.9
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
## 2024-04-08 ENCOUNTER — Ambulatory Visit

## 2024-04-08 VITALS — BP 140/80 | HR 77 | Temp 97.7°F | Ht 63.0 in | Wt 146.6 lb

## 2024-04-08 DIAGNOSIS — G2581 Restless legs syndrome: Secondary | ICD-10-CM | POA: Diagnosis not present

## 2024-04-08 DIAGNOSIS — E782 Mixed hyperlipidemia: Secondary | ICD-10-CM | POA: Diagnosis not present

## 2024-04-08 DIAGNOSIS — N184 Chronic kidney disease, stage 4 (severe): Secondary | ICD-10-CM

## 2024-04-08 DIAGNOSIS — S1093XA Contusion of unspecified part of neck, initial encounter: Secondary | ICD-10-CM | POA: Diagnosis not present

## 2024-04-08 DIAGNOSIS — R413 Other amnesia: Secondary | ICD-10-CM

## 2024-04-08 DIAGNOSIS — M47812 Spondylosis without myelopathy or radiculopathy, cervical region: Secondary | ICD-10-CM

## 2024-04-08 DIAGNOSIS — R58 Hemorrhage, not elsewhere classified: Secondary | ICD-10-CM | POA: Insufficient documentation

## 2024-04-08 DIAGNOSIS — Z794 Long term (current) use of insulin: Secondary | ICD-10-CM | POA: Diagnosis not present

## 2024-04-08 DIAGNOSIS — E1122 Type 2 diabetes mellitus with diabetic chronic kidney disease: Secondary | ICD-10-CM

## 2024-04-08 DIAGNOSIS — S81812A Laceration without foreign body, left lower leg, initial encounter: Secondary | ICD-10-CM | POA: Insufficient documentation

## 2024-04-08 DIAGNOSIS — M810 Age-related osteoporosis without current pathological fracture: Secondary | ICD-10-CM

## 2024-04-08 DIAGNOSIS — Z711 Person with feared health complaint in whom no diagnosis is made: Secondary | ICD-10-CM | POA: Diagnosis not present

## 2024-04-08 DIAGNOSIS — M542 Cervicalgia: Secondary | ICD-10-CM | POA: Insufficient documentation

## 2024-04-08 DIAGNOSIS — I7389 Other specified peripheral vascular diseases: Secondary | ICD-10-CM | POA: Insufficient documentation

## 2024-04-08 DIAGNOSIS — G3189 Other specified degenerative diseases of nervous system: Secondary | ICD-10-CM | POA: Diagnosis not present

## 2024-04-08 MED ORDER — BACITRACIN 500 UNIT/GM EX OINT: 1.0000 | TOPICAL_OINTMENT | Freq: Two times a day (BID) | CUTANEOUS | 0 refills | Status: DC

## 2024-04-08 MED ORDER — FREESTYLE LIBRE 3 PLUS SENSOR MISC: 3 refills | Status: AC

## 2024-04-08 NOTE — Assessment & Plan Note (Addendum)
 Hold off on Ropinirole  0.25 mg at bedtime as she has not been taking it regularly. Referral to neurology made today.

## 2024-04-08 NOTE — Assessment & Plan Note (Addendum)
 Chronic condition with chronic blood glucose above goal. Insulin  regimen inconsistent. Continue current insulin  Humulin  20 units twice a day as recommended by endocrinology. Encourage medication adherence.   Referral to endocrinology (to [facility] clinic for closer proximity and second opinion upon patient request). Emphasized diabetes, cholesterol, and blood pressure control to prevent kidney disease progression. Sent a new glucose sensor for three months' supply. Referred to dietitian for dietary management. Continue f/u with Dr. Dennise for nephropathy.  Check UA.  Home health referral made to help patient with ADLs, physical therapy, child psychotherapist.  Orders:   Ambulatory referral to Endocrinology   Continuous Glucose Sensor (FREESTYLE LIBRE 3 PLUS SENSOR) MISC; Use the Freestyle Libre Sensor to monitor bloodsugar readings as directed by provider. Change sensor every 15 days.Use the Freestyle Libre Sensor to monitor bloodsugar readings as directed by provider.Change sensor every 15 days.Please refill 3 months supply at a time   Ambulatory referral to Home Health   Amb ref to Medical Nutrition Therapy-MNT   Urinalysis, Routine w reflex microscopic

## 2024-04-08 NOTE — Assessment & Plan Note (Addendum)
 Avoid strenuous activity, heavy lifting, or neck manipulation. Ice application: Apply ice packs intermittently for the next 12-24 hours to reduce swelling and discomfort.  Avoid NSAIDs to limit bleeding risk.  Continue Hydrocodone -acetaminophen  5-325 mg, twice a daily prn as prescribed by [facility] for cervical spine arthritis.    Red flag symptoms: Rapid increase in size, difficulty breathing or swallowing, voice changes, fever, or signs of infection. You need emergent evaluation in ED if red flag symptoms occurs.  No driving till evaluation by neurology.

## 2024-04-08 NOTE — Assessment & Plan Note (Signed)
 Apply bacitracin twice a day for 7 days, reach out to our clinic if redness, discharge, worsening pain.  Orders:   bacitracin 500 UNIT/GM ointment; Apply 1 Application topically 2 (two) times daily.

## 2024-04-08 NOTE — Assessment & Plan Note (Signed)
 Continue follow up with [facility].  HH referral for physical therapy, OT, home aides evaluation done today.  Orders:   Ambulatory referral to Home Health

## 2024-04-08 NOTE — Assessment & Plan Note (Addendum)
 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 Orders:   Ambulatory referral to Endocrinology   Continuous Glucose Sensor (FREESTYLE LIBRE 3 PLUS SENSOR) MISC; Use the Freestyle Libre Sensor to monitor bloodsugar readings as directed by provider. Change sensor every 15 days.Use the Freestyle Libre Sensor to monitor bloodsugar readings as directed by provider.Change sensor every 15 days.Please refill 3 months supply at a time   Ambulatory referral to Home Health
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
## 2024-04-08 NOTE — Assessment & Plan Note (Addendum)
 Check UA. Orders:   Urinalysis, Routine w reflex microscopic

## 2024-04-08 NOTE — Assessment & Plan Note (Addendum)
 Reviewed CT from ED:   No acute intracranial abnormality. Moderate for age cerebral white matter changes most commonly due to small vessel disease. Discussed BG, BP, cholesterol control in role of reducing progression of small vessel disease.  Noted cognitive decline by patient's family. No prior neurologist evaluation. Referred to neurology for memory evaluation done today.  HH referral made today.  Orders:   Ambulatory referral to Neurology

## 2024-04-08 NOTE — Assessment & Plan Note (Signed)
 Continue twice a year Prolia  injection. Scheduled for 04/24/24

## 2024-04-08 NOTE — Assessment & Plan Note (Deleted)
 SABRA

## 2024-04-08 NOTE — Assessment & Plan Note (Deleted)
  Orders:   Ambulatory referral to Endocrinology   Continuous Glucose Sensor (FREESTYLE LIBRE 3 PLUS SENSOR) MISC; Use the Freestyle Libre Sensor to monitor bloodsugar readings as directed by provider. Change sensor every 15 days.Use the Freestyle Libre Sensor to monitor bloodsugar readings as directed by provider.Change sensor every 15 days.Please refill 3 months supply at a time   Amb ref to Medical Nutrition Therapy-MNT

## 2024-04-08 NOTE — Patient Instructions (Addendum)
 For neck contusion/hematoma:  Rapid increase in size, difficulty breathing or swallowing, voice changes, fever, or signs of infection. You need emergent evaluation in ED if red flag symptoms occurs.    Continue taking B12 1000 mcg daily.  I am discontinuing Ropinirole  for rest less legs since you have not been taking this regularly and we really don't know if it is working.    I am referring you to [facility] endocrine department. If you do not hear from them in 2 weeks to schedule an appointment please reach out to our office. I am also referring you to a dietitian to discuss improving blood glucose and help with kidney health.    I am referring home health evaluation to help you with physical therapy, social worker and aid to help with activities of daily living.   Please stop driving. You need to see a neurologist before you start driving. I have put a referral to the neurologist and they should reach out to you to schedule an appointment.   Apply bacitracin cream twice a day on left lower leg for about a week.   Follow up with Dr. Abbey in 4-6 weeks.

## 2024-04-08 NOTE — Assessment & Plan Note (Addendum)
 Continue crestor '10mg'$  daily

## 2024-04-08 NOTE — Assessment & Plan Note (Signed)
 Plan per small vessel disease of brain.  I recommend patient not to drive till evaluation by neurology. This was discussed in detail with patient and her daughter Clarita.  Orders:   Ambulatory referral to Home Health

## 2024-04-08 NOTE — Assessment & Plan Note (Addendum)
 Left upper chest. Discussed pain management. Continue monitoring. Also defer to plan per contusion from today's visit.

## 2024-04-09 ENCOUNTER — Ambulatory Visit: Payer: Self-pay

## 2024-04-09 LAB — URINALYSIS, ROUTINE W REFLEX MICROSCOPIC
Hgb urine dipstick: NEGATIVE
Ketones, ur: NEGATIVE
Leukocytes,Ua: NEGATIVE
Nitrite: NEGATIVE
Specific Gravity, Urine: >=1.03 — AB (ref 1.000–1.030)
Total Protein, Urine: 100 — AB
Urine Glucose: 100 — AB
Urobilinogen, UA: 0.2 (ref 0.0–1.0)
pH: 5.5 (ref 5.0–8.0)

## 2024-04-09 NOTE — Progress Notes (Signed)
 Last mychart login: 05/04/2023  Please let the patient know urine result from 04/08/24 does not suggest of urinary tract infection. No antibiotic recommended at this time. Urine was suggestive of mild dehydration, recommend increasing daily water intake to 40-50 oz per day.  Thank you,  Luke Shade, MD

## 2024-04-15 ENCOUNTER — Telehealth: Payer: Self-pay

## 2024-04-15 DIAGNOSIS — M47812 Spondylosis without myelopathy or radiculopathy, cervical region: Secondary | ICD-10-CM

## 2024-04-15 DIAGNOSIS — R413 Other amnesia: Secondary | ICD-10-CM

## 2024-04-15 NOTE — Telephone Encounter (Signed)
 Copied from CRM [id number]. Topic: Appointments - Appointment Info/Confirmation >> Apr 15, 2024  9:36 AM Delon DASEN wrote: Daughter Clarita needs to speak with Dr Abbey without patient present regarding the patient's health, have some new concerns, she is asking for an appt to speak with Dr Abbey- [phone]

## 2024-04-15 NOTE — Telephone Encounter (Signed)
 Called patient's daughter to gather more information in regards to the concerns she has for her mother. Unable to leave message as it states unable to complete call at this time. Will attempt again later and notify provider as well.
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
## 2024-04-17 DIAGNOSIS — E1165 Type 2 diabetes mellitus with hyperglycemia: Secondary | ICD-10-CM | POA: Diagnosis not present

## 2024-04-17 DIAGNOSIS — M533 Sacrococcygeal disorders, not elsewhere classified: Secondary | ICD-10-CM | POA: Diagnosis not present

## 2024-04-17 DIAGNOSIS — M546 Pain in thoracic spine: Secondary | ICD-10-CM | POA: Diagnosis not present

## 2024-04-17 DIAGNOSIS — M47812 Spondylosis without myelopathy or radiculopathy, cervical region: Secondary | ICD-10-CM | POA: Diagnosis not present

## 2024-04-17 DIAGNOSIS — T2101XA Burn of unspecified degree of chest wall, initial encounter: Secondary | ICD-10-CM | POA: Diagnosis not present

## 2024-04-17 DIAGNOSIS — M25572 Pain in left ankle and joints of left foot: Secondary | ICD-10-CM | POA: Diagnosis not present

## 2024-04-17 DIAGNOSIS — M810 Age-related osteoporosis without current pathological fracture: Secondary | ICD-10-CM | POA: Diagnosis not present

## 2024-04-17 DIAGNOSIS — M7062 Trochanteric bursitis, left hip: Secondary | ICD-10-CM | POA: Diagnosis not present

## 2024-04-17 DIAGNOSIS — Z79899 Other long term (current) drug therapy: Secondary | ICD-10-CM | POA: Diagnosis not present

## 2024-04-17 DIAGNOSIS — M5032 Other cervical disc degeneration, mid-cervical region, unspecified level: Secondary | ICD-10-CM | POA: Diagnosis not present

## 2024-04-17 DIAGNOSIS — Q762 Congenital spondylolisthesis: Secondary | ICD-10-CM | POA: Diagnosis not present

## 2024-04-17 DIAGNOSIS — G8929 Other chronic pain: Secondary | ICD-10-CM | POA: Diagnosis not present

## 2024-04-17 NOTE — Telephone Encounter (Signed)
 Can we call the patient's daughter to follow up on this.    Luke Shade, MD
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
## 2024-04-18 DIAGNOSIS — L570 Actinic keratosis: Secondary | ICD-10-CM | POA: Diagnosis not present

## 2024-04-18 DIAGNOSIS — D2272 Melanocytic nevi of left lower limb, including hip: Secondary | ICD-10-CM | POA: Diagnosis not present

## 2024-04-18 DIAGNOSIS — D485 Neoplasm of uncertain behavior of skin: Secondary | ICD-10-CM | POA: Diagnosis not present

## 2024-04-18 DIAGNOSIS — D225 Melanocytic nevi of trunk: Secondary | ICD-10-CM | POA: Diagnosis not present

## 2024-04-18 DIAGNOSIS — Z85828 Personal history of other malignant neoplasm of skin: Secondary | ICD-10-CM | POA: Diagnosis not present

## 2024-04-18 DIAGNOSIS — D2261 Melanocytic nevi of right upper limb, including shoulder: Secondary | ICD-10-CM | POA: Diagnosis not present

## 2024-04-18 DIAGNOSIS — D2262 Melanocytic nevi of left upper limb, including shoulder: Secondary | ICD-10-CM | POA: Diagnosis not present

## 2024-04-23 DIAGNOSIS — N2581 Secondary hyperparathyroidism of renal origin: Secondary | ICD-10-CM | POA: Diagnosis not present

## 2024-04-23 DIAGNOSIS — Z794 Long term (current) use of insulin: Secondary | ICD-10-CM | POA: Diagnosis not present

## 2024-04-23 DIAGNOSIS — I10 Essential (primary) hypertension: Secondary | ICD-10-CM | POA: Diagnosis not present

## 2024-04-23 DIAGNOSIS — E1122 Type 2 diabetes mellitus with diabetic chronic kidney disease: Secondary | ICD-10-CM | POA: Diagnosis not present

## 2024-04-23 DIAGNOSIS — N1832 Chronic kidney disease, stage 3b: Secondary | ICD-10-CM | POA: Diagnosis not present

## 2024-04-24 ENCOUNTER — Ambulatory Visit (INDEPENDENT_AMBULATORY_CARE_PROVIDER_SITE_OTHER)

## 2024-04-24 DIAGNOSIS — Z23 Encounter for immunization: Secondary | ICD-10-CM

## 2024-04-24 DIAGNOSIS — M810 Age-related osteoporosis without current pathological fracture: Secondary | ICD-10-CM | POA: Diagnosis not present

## 2024-04-24 MED ORDER — DENOSUMAB 60 MG/ML ~~LOC~~ SOSY: 60.0000 mg | PREFILLED_SYRINGE | SUBCUTANEOUS | Status: AC

## 2024-04-24 NOTE — Progress Notes (Signed)
 Pt presented for her Prolia  injection. Pt was identified through two identifiers.. Pt tolerated the subq injection well in the left arm.

## 2024-05-15 ENCOUNTER — Encounter: Payer: Self-pay | Admitting: Dietician

## 2024-05-15 ENCOUNTER — Encounter: Admitting: Dietician

## 2024-05-15 VITALS — Ht 63.0 in | Wt 144.3 lb

## 2024-05-15 DIAGNOSIS — Z713 Dietary counseling and surveillance: Secondary | ICD-10-CM | POA: Insufficient documentation

## 2024-05-15 DIAGNOSIS — N184 Chronic kidney disease, stage 4 (severe): Secondary | ICD-10-CM | POA: Insufficient documentation

## 2024-05-15 DIAGNOSIS — E1122 Type 2 diabetes mellitus with diabetic chronic kidney disease: Secondary | ICD-10-CM | POA: Insufficient documentation

## 2024-05-15 DIAGNOSIS — Z794 Long term (current) use of insulin: Secondary | ICD-10-CM | POA: Insufficient documentation

## 2024-05-15 NOTE — Progress Notes (Signed)
 Diabetes Self-Management Education  Visit Type: First/Initial  Appt. Start Time: 1340 Appt. End Time: 1455  05/15/2024  [name], identified by name and date of birth, is a 84 y.o. female with a diagnosis of Diabetes: Type 2.   ASSESSMENT  Height 5' 3 (1.6 m), weight 144 lb 4.8 oz (65.5 kg). Body mass index is 25.56 kg/m.   Diabetes Self-Management Education - 05/15/24 1358       Visit Information   Visit Type First/Initial      Initial Visit   Diabetes Type Type 2    Date Diagnosed 2005    Are you currently following a meal plan? No    Are you taking your medications as prescribed? Yes      Health Coping   How would you rate your overall health? Fair      Psychosocial Assessment   Patient Belief/Attitude about Diabetes Afraid    What is the hardest part about your diabetes right now, causing you the most concern, or is the most worrisome to you about your diabetes?   Making healty food and beverage choices    How often do you need to have someone help you when you read instructions, pamphlets, or other written materials from your doctor or pharmacy? 1 - Never    What is the last grade level you completed in school? 12      Pre-Education Assessment   Patient understands the diabetes disease and treatment process. Needs Review    Patient understands incorporating nutritional management into lifestyle. Needs Review    Patient undertands incorporating physical activity into lifestyle. Needs Review    Patient understands using medications safely. Needs Review    Patient understands monitoring blood glucose, interpreting and using results Comprehends key points    Patient understands prevention, detection, and treatment of acute complications. Needs Review    Patient understands prevention, detection, and treatment of chronic complications. Needs Review    Patient understands how to develop strategies to address psychosocial issues. Needs Review    Patient  understands how to develop strategies to promote health/change behavior. Needs Review      Complications   Last HgB A1C per patient/outside source 9.8 %   02/2024   How often do you check your blood sugar? > 4 times/day   using Freestyle Libre 3 CGM with sensor   Fasting Blood glucose range (mg/dL) --   time in range 69%, with 70% above range, 0% below -- past 14 days   Number of hypoglycemic episodes per month 0    Number of hyperglycemic episodes ( >200mg /dL): Daily    Have you had a dilated eye exam in the past 12 months? Yes    Have you had a dental exam in the past 12 months? Yes    Are you checking your feet? Yes    How many days per week are you checking your feet? 7      Dietary Intake   Breakfast sleeps until about noon; 1pm -- egg sandwich/ cereal raisin bran/ peanut butter and jelly on toast; hot tea, green tea    Snack (morning) none    Lunch --    Snack (afternoon) potato chips/ popcorn    Dinner 6-7pm chicken/ fish + bread -- usually not well balanced per patient    Snack (evening) chips + french onion dip/ popcorn before going to bed -- goes to sleep 1-2am    Beverage(s) hot tea/ green tea/ water/ 1 pepsi daily --  does not like zero sugar sodas or flavored waters      Activity / Exercise   Activity / Exercise Type ADL's   was going to dance class 3x a week; stopped 10/2023     Patient Education   Previous Diabetes Education Yes   years ago per patient   Healthy Eating Role of diet in the treatment of diabetes and the relationship between the three main macronutrients and blood glucose level;Food label reading, portion sizes and measuring food.;Plate Method;Meal timing in regards to the patients' current diabetes medication.;Meal options for control of blood glucose level and chronic complications.;Other (comment)   low sodium, moderate protein for CKD; small, frequent meals and snacks low in fat and fiber for gastroparesis   Monitoring Other (comment)   time in range goals  for CGM   Chronic complications Identified and discussed with patient  current chronic complications    Diabetes Stress and Support Role of stress on diabetes      Individualized Goals (developed by patient)   Nutrition General guidelines for healthy choices and portions discussed      Outcomes   Expected Outcomes Demonstrated interest in learning. Expect positive outcomes    Future DMSE PRN   patient lacking transportation at this time         Individualized Plan for Diabetes Self-Management Training:   Learning Objective:  Patient will have a greater understanding of diabetes self-management. Patient education plan is to attend individual and/or group sessions per assessed needs and concerns.   Plan:   Patient Instructions  Try some Healthy Choice or Ald[employer] brand (Fit and Active) frozen meals Eat a small meal or a healthy snack every 3-5 hours during the day while awake.  Keep portions of meats small, size of the palm of the hand or smaller.  Include a fruit or veggie with every meal or snack -- frozen, fresh, or canned (low sodium and sugar).  Try mixing 1 cup plain or vanilla Greek yogurt with 1/3 cup peanut butter and 2 teaspoons honey. Use it as a dip for fruit or graham crackers.   Expected Outcomes:  Demonstrated interest in learning. Expect positive outcomes  Education material provided: General Meal Planning Guidelines for Diabetes; plate planner with food lists; sample menus; snacking handout  If problems or questions, patient to contact team via:  Phone  Future DSME appointment: PRN (patient lacking transportation at this time)

## 2024-05-15 NOTE — Patient Instructions (Signed)
 Try some Healthy Choice or [employer] brand (Fit and Active) frozen meals Eat a small meal or a healthy snack every 3-5 hours during the day while awake.  Keep portions of meats small, size of the palm of the hand or smaller.  Include a fruit or veggie with every meal or snack -- frozen, fresh, or canned (low sodium and sugar).  Try mixing 1 cup plain or vanilla Greek yogurt with 1/3 cup peanut butter and 2 teaspoons honey. Use it as a dip for fruit or graham crackers.

## 2024-05-21 ENCOUNTER — Ambulatory Visit

## 2024-05-21 NOTE — Progress Notes (Cosign Needed Addendum)
 Pt received High Dose Flu injection in Right  deltoid  muscle. Pt tolerated it well with no complaints or concerns.

## 2024-05-21 NOTE — Addendum Note (Signed)
 Addended by: Haly Feher on: 05/21/2024 12:07 PM   Modules accepted: Orders

## 2024-05-27 ENCOUNTER — Ambulatory Visit

## 2024-05-27 DIAGNOSIS — K3184 Gastroparesis: Secondary | ICD-10-CM

## 2024-05-27 DIAGNOSIS — E1169 Type 2 diabetes mellitus with other specified complication: Secondary | ICD-10-CM | POA: Diagnosis not present

## 2024-05-27 DIAGNOSIS — E785 Hyperlipidemia, unspecified: Secondary | ICD-10-CM

## 2024-05-27 DIAGNOSIS — Z7984 Long term (current) use of oral hypoglycemic drugs: Secondary | ICD-10-CM

## 2024-05-27 NOTE — Progress Notes (Signed)
 "  Established Patient Office Visit   Subjective  Patient ID: Kelly [name], female    DOB: Jun 13, 1940  Age: 84 y.o. MRN: [id number]  Chief Complaint  Patient presents with   GI Bleeding   Diabetes    Discussed the use of AI scribe software for clinical note transcription with the patient, who gave verbal consent to proceed.  History of Present Illness Kelly [name] is an 84 year old female with diabetes and history of gastroparesis who presents with gastrointestinal symptoms and cognitive concerns.  She has been experiencing gastrointestinal symptoms, including a sensation of food not going down and acid reflux, with vomit coming up in the back of her throat. These symptoms have been frequent over the past week. She was previously diagnosed with gastroparesis and recalls similar symptoms. Her daughter noted that she vomited pills taken seven hours prior, indicating delayed gastric emptying. She was previously on Reglan , which resolved her symptoms but was discontinued due to side effects. She has not been using Tums regularly and seldom takes them.  She has been feeling depressed since her last visit, primarily due to losing her independence after being advised not to drive. She is concerned about potential cognitive decline and has an upcoming neurology appointment on April 16th to evaluate for dementia.  She is established with [facility] endocrinology department and currently managing her diabetes with Basaglar , 10 units at noon, and Tradjenta, 5 mg. She has been trying to adjust her diet with the help of a nutritional specialist. She has an upcoming endocrinology appointment on March 20th.  She has experienced diarrhea a couple of times, notably after eating barbecue. Her daughter mentioned she used an enema during these episodes, which is typically for constipation. She has regular bowel movements once a day when asymptomatic.  She has a history of elevated blood pressure.  She occasionally takes oxycodone for pain, no more than one a day.  She has an upcoming nephrology appointment in April. She is concerned about potential interactions between her medications and her kidney disease.    ROS As per HPI    Objective:     There were no vitals taken for this visit.     05/15/2024    1:44 PM 04/01/2024    9:01 AM 02/05/2024    4:38 PM  Depression screen PHQ 2/9  Decreased Interest 3 0 0  Down, Depressed, Hopeless 3 0 0  PHQ - 2 Score 6 0 0  Altered sleeping 3 3 1   Tired, decreased energy 3 2 0  Change in appetite 0 0 0  Feeling bad or failure about yourself  0 0 0  Trouble concentrating 0 0 0  Moving slowly or fidgety/restless 3 0 0  Suicidal thoughts 0 0 0  PHQ-9 Score 15 5 1    Difficult doing work/chores  Somewhat difficult Not difficult at all     Data saved with a previous flowsheet row definition      02/05/2024    4:39 PM 12/06/2023    1:19 PM 07/21/2023    2:25 PM 06/22/2023    1:01 PM  GAD 7 : Generalized Anxiety Score  Nervous, Anxious, on Edge 0  0  0  0   Control/stop worrying 0  0  0  0   Worry too much - different things 0  0  0  0   Trouble relaxing 0  0  0  0   Restless 0  0  0  0  Easily annoyed or irritable 0  0  0  0   Afraid - awful might happen 0  0  0  0   Total GAD 7 Score 0 0 0 0  Anxiety Difficulty Not difficult at all Not difficult at all Not difficult at all Not difficult at all     Data saved with a previous flowsheet row definition      05/15/2024    1:44 PM 04/01/2024    9:01 AM 02/05/2024    4:38 PM  Depression screen PHQ 2/9  Decreased Interest 3 0 0  Down, Depressed, Hopeless 3 0 0  PHQ - 2 Score 6 0 0  Altered sleeping 3 3 1   Tired, decreased energy 3 2 0  Change in appetite 0 0 0  Feeling bad or failure about yourself  0 0 0  Trouble concentrating 0 0 0  Moving slowly or fidgety/restless 3 0 0  Suicidal thoughts 0 0 0  PHQ-9 Score 15 5 1    Difficult doing work/chores  Somewhat difficult Not  difficult at all     Data saved with a previous flowsheet row definition      02/05/2024    4:39 PM 12/06/2023    1:19 PM 07/21/2023    2:25 PM 06/22/2023    1:01 PM  GAD 7 : Generalized Anxiety Score  Nervous, Anxious, on Edge 0  0  0  0   Control/stop worrying 0  0  0  0   Worry too much - different things 0  0  0  0   Trouble relaxing 0  0  0  0   Restless 0  0  0  0   Easily annoyed or irritable 0  0  0  0   Afraid - awful might happen 0  0  0  0   Total GAD 7 Score 0 0 0 0  Anxiety Difficulty Not difficult at all Not difficult at all Not difficult at all Not difficult at all     Data saved with a previous flowsheet row definition   SDOH Screenings   Food Insecurity: No Food Insecurity (04/01/2024)  Housing: Unknown (04/26/2024)   Received from [facility]  Transportation Needs: No Transportation Needs (04/01/2024)  Utilities: Not At Risk (04/01/2024)  Alcohol Screen: Low Risk (04/01/2024)  Depression (PHQ2-9): High Risk (05/15/2024)  Financial Resource Strain: Low Risk (04/01/2024)  Physical Activity: Inactive (04/01/2024)  Social Connections: Moderately Integrated (04/01/2024)  Stress: No Stress Concern Present (04/01/2024)  Tobacco Use: Low Risk (05/27/2024)  Health Literacy: Adequate Health Literacy (04/01/2024)     Physical Exam Constitutional:      General: She is not in acute distress. HENT:     Head: Normocephalic.     Comments: Wearing hearing aids    Mouth/Throat:     Mouth: Mucous membranes are moist.  Cardiovascular:     Rate and Rhythm: Normal rate.  Pulmonary:     Effort: Pulmonary effort is normal.     Breath sounds: Normal breath sounds.  Abdominal:     Palpations: Abdomen is soft.     Tenderness: There is no abdominal tenderness. There is no guarding.  Musculoskeletal:     Cervical back: Normal range of motion and neck supple.     Right lower leg: No edema.     Left lower leg: No edema.  Neurological:     Mental Status: She  is alert and oriented to person, place, and  time.  Psychiatric:        Mood and Affect: Mood normal.        No results found for any visits on 05/27/24.  The ASCVD Risk score (Arnett DK, et al., 2019) failed to calculate for the following reasons:   The 2019 ASCVD risk score is only valid for ages 75 to 40   * - Cholesterol units were assumed     Assessment & Plan:   Assessment & Plan Hyperlipidemia associated with type 2 diabetes mellitus (HCC) Continue rosuvastatin  10 mg daily.     Gastroparesis History of gastroparesis that improved after stopping GLP-1 group medication in the past.  She also noted improvement in symptoms in the past when she was treated with Reglan .  We have concern for tremors, concern for fall, gait abnormality, cognitive decline for which patient is seeing neurologist which is scheduled for 08/22/2024 as a result hesitant to restart metoclopramide . Advised taking Tums for acid reflux. Follow up with endocrinologist on March 20th. Discuss gastroparesis symptoms with endocrinologist if she persists.  She has previously seen Dr. Mindy, referral to [facility] to Dr Therisa made today. Maintain a balanced diet with smaller, easily digestible meals. Recommend against enema.  Orders:   Ambulatory referral to Gastroenterology    Return in about 4 months (around 09/24/2024) for Chronic follow up .   Luke Shade, MD "

## 2024-05-27 NOTE — Assessment & Plan Note (Addendum)
 History of gastroparesis that improved after stopping GLP-1 group medication in the past.  She also noted improvement in symptoms in the past when she was treated with Reglan .  We have concern for tremors, concern for fall, gait abnormality, cognitive decline for which patient is seeing neurologist which is scheduled for 08/22/2024 as a result hesitant to restart metoclopramide . Advised taking Tums for acid reflux. Follow up with endocrinologist on March 20th. Discuss gastroparesis symptoms with endocrinologist if she persists.  She has previously seen Dr. Mindy, referral to [facility] GI to Dr Therisa made today. Maintain a balanced diet with smaller, easily digestible meals. Recommend against enema.  Orders:   Ambulatory referral to Gastroenterology

## 2024-06-07 ENCOUNTER — Encounter

## 2024-06-20 ENCOUNTER — Ambulatory Visit: Admitting: Nurse Practitioner

## 2024-08-22 ENCOUNTER — Ambulatory Visit: Admitting: Neurology

## 2024-09-24 ENCOUNTER — Ambulatory Visit

## 2024-10-24 ENCOUNTER — Ambulatory Visit

## 2025-04-08 ENCOUNTER — Ambulatory Visit
